# Patient Record
Sex: Female | Born: 1950 | Race: Black or African American | Hispanic: No | Marital: Married | Smoking: Never smoker
Health system: Southern US, Community
[De-identification: ages and names within clinical notes are randomized; demographics above are authoritative.]

## PROBLEM LIST (undated history)

## (undated) DIAGNOSIS — M1990 Unspecified osteoarthritis, unspecified site: Secondary | ICD-10-CM

## (undated) DIAGNOSIS — D649 Anemia, unspecified: Secondary | ICD-10-CM

## (undated) DIAGNOSIS — I509 Heart failure, unspecified: Secondary | ICD-10-CM

## (undated) DIAGNOSIS — N183 Chronic kidney disease, stage 3 unspecified: Secondary | ICD-10-CM

## (undated) DIAGNOSIS — I10 Essential (primary) hypertension: Secondary | ICD-10-CM

## (undated) DIAGNOSIS — K219 Gastro-esophageal reflux disease without esophagitis: Secondary | ICD-10-CM

## (undated) DIAGNOSIS — N289 Disorder of kidney and ureter, unspecified: Secondary | ICD-10-CM

## (undated) HISTORY — PX: OTHER SURGICAL HISTORY: SHX169

## (undated) HISTORY — DX: Anemia, unspecified: D64.9

## (undated) HISTORY — DX: Heart failure, unspecified: I50.9

## (undated) HISTORY — PX: COLONOSCOPY: SHX174

## (undated) HISTORY — DX: Essential (primary) hypertension: I10

---

## 1998-02-13 ENCOUNTER — Inpatient Hospital Stay (HOSPITAL_COMMUNITY): Admission: AD | Admit: 1998-02-13 | Discharge: 1998-02-13 | Payer: Self-pay | Admitting: Obstetrics

## 1998-02-13 ENCOUNTER — Encounter: Payer: Self-pay | Admitting: Obstetrics

## 1998-02-14 ENCOUNTER — Inpatient Hospital Stay (HOSPITAL_COMMUNITY): Admission: AD | Admit: 1998-02-14 | Discharge: 1998-02-14 | Payer: Self-pay | Admitting: Obstetrics & Gynecology

## 1998-02-14 ENCOUNTER — Other Ambulatory Visit: Admission: RE | Admit: 1998-02-14 | Discharge: 1998-02-14 | Payer: Self-pay | Admitting: Obstetrics

## 1999-03-17 ENCOUNTER — Inpatient Hospital Stay (HOSPITAL_COMMUNITY): Admission: EM | Admit: 1999-03-17 | Discharge: 1999-03-19 | Payer: Self-pay | Admitting: *Deleted

## 1999-03-17 ENCOUNTER — Encounter: Payer: Self-pay | Admitting: *Deleted

## 1999-03-31 ENCOUNTER — Encounter: Admission: RE | Admit: 1999-03-31 | Discharge: 1999-06-29 | Payer: Self-pay | Admitting: *Deleted

## 1999-04-02 ENCOUNTER — Encounter: Admission: RE | Admit: 1999-04-02 | Discharge: 1999-04-02 | Payer: Self-pay | Admitting: Internal Medicine

## 1999-04-08 ENCOUNTER — Encounter: Admission: RE | Admit: 1999-04-08 | Discharge: 1999-04-08 | Payer: Self-pay | Admitting: Internal Medicine

## 1999-05-15 ENCOUNTER — Encounter: Admission: RE | Admit: 1999-05-15 | Discharge: 1999-05-15 | Payer: Self-pay | Admitting: Internal Medicine

## 1999-06-25 ENCOUNTER — Encounter: Admission: RE | Admit: 1999-06-25 | Discharge: 1999-06-25 | Payer: Self-pay | Admitting: Internal Medicine

## 1999-09-12 ENCOUNTER — Encounter: Admission: RE | Admit: 1999-09-12 | Discharge: 1999-09-12 | Payer: Self-pay | Admitting: Internal Medicine

## 2000-01-09 ENCOUNTER — Encounter: Admission: RE | Admit: 2000-01-09 | Discharge: 2000-01-09 | Payer: Self-pay

## 2000-05-21 ENCOUNTER — Encounter: Admission: RE | Admit: 2000-05-21 | Discharge: 2000-05-21 | Payer: Self-pay | Admitting: Internal Medicine

## 2000-06-17 ENCOUNTER — Encounter: Admission: RE | Admit: 2000-06-17 | Discharge: 2000-06-17 | Payer: Self-pay

## 2000-11-24 ENCOUNTER — Encounter: Admission: RE | Admit: 2000-11-24 | Discharge: 2000-11-24 | Payer: Self-pay | Admitting: Internal Medicine

## 2000-12-27 ENCOUNTER — Encounter: Admission: RE | Admit: 2000-12-27 | Discharge: 2000-12-27 | Payer: Self-pay

## 2001-06-15 ENCOUNTER — Encounter: Admission: RE | Admit: 2001-06-15 | Discharge: 2001-06-15 | Payer: Self-pay

## 2001-10-25 ENCOUNTER — Encounter: Admission: RE | Admit: 2001-10-25 | Discharge: 2001-10-25 | Payer: Self-pay | Admitting: Internal Medicine

## 2001-10-29 ENCOUNTER — Emergency Department (HOSPITAL_COMMUNITY): Admission: EM | Admit: 2001-10-29 | Discharge: 2001-10-29 | Payer: Self-pay | Admitting: Emergency Medicine

## 2001-11-25 ENCOUNTER — Encounter: Admission: RE | Admit: 2001-11-25 | Discharge: 2001-11-25 | Payer: Self-pay | Admitting: Internal Medicine

## 2001-12-01 ENCOUNTER — Encounter: Admission: RE | Admit: 2001-12-01 | Discharge: 2001-12-01 | Payer: Self-pay | Admitting: Internal Medicine

## 2002-02-16 ENCOUNTER — Encounter: Admission: RE | Admit: 2002-02-16 | Discharge: 2002-02-16 | Payer: Self-pay | Admitting: Internal Medicine

## 2002-04-09 ENCOUNTER — Emergency Department (HOSPITAL_COMMUNITY): Admission: EM | Admit: 2002-04-09 | Discharge: 2002-04-09 | Payer: Self-pay | Admitting: Nurse Practitioner

## 2002-06-27 ENCOUNTER — Encounter: Admission: RE | Admit: 2002-06-27 | Discharge: 2002-06-27 | Payer: Self-pay | Admitting: Internal Medicine

## 2002-07-13 ENCOUNTER — Encounter: Payer: Self-pay | Admitting: Ophthalmology

## 2002-07-17 ENCOUNTER — Ambulatory Visit (HOSPITAL_COMMUNITY): Admission: RE | Admit: 2002-07-17 | Discharge: 2002-07-17 | Payer: Self-pay | Admitting: Ophthalmology

## 2002-08-31 ENCOUNTER — Ambulatory Visit (HOSPITAL_COMMUNITY): Admission: RE | Admit: 2002-08-31 | Discharge: 2002-08-31 | Payer: Self-pay | Admitting: Ophthalmology

## 2002-09-04 ENCOUNTER — Ambulatory Visit (HOSPITAL_COMMUNITY): Admission: RE | Admit: 2002-09-04 | Discharge: 2002-09-04 | Payer: Self-pay | Admitting: Ophthalmology

## 2002-10-09 ENCOUNTER — Emergency Department (HOSPITAL_COMMUNITY): Admission: EM | Admit: 2002-10-09 | Discharge: 2002-10-10 | Payer: Self-pay | Admitting: Emergency Medicine

## 2002-10-10 ENCOUNTER — Encounter: Payer: Self-pay | Admitting: Emergency Medicine

## 2002-10-11 ENCOUNTER — Encounter: Admission: RE | Admit: 2002-10-11 | Discharge: 2002-10-11 | Payer: Self-pay | Admitting: Infectious Diseases

## 2002-11-01 ENCOUNTER — Encounter: Admission: RE | Admit: 2002-11-01 | Discharge: 2003-01-30 | Payer: Self-pay | Admitting: *Deleted

## 2003-02-13 ENCOUNTER — Encounter: Admission: RE | Admit: 2003-02-13 | Discharge: 2003-02-13 | Payer: Self-pay | Admitting: Internal Medicine

## 2003-03-09 ENCOUNTER — Emergency Department (HOSPITAL_COMMUNITY): Admission: EM | Admit: 2003-03-09 | Discharge: 2003-03-09 | Payer: Self-pay | Admitting: Emergency Medicine

## 2003-03-14 ENCOUNTER — Encounter: Admission: RE | Admit: 2003-03-14 | Discharge: 2003-03-14 | Payer: Self-pay | Admitting: Internal Medicine

## 2003-09-18 ENCOUNTER — Encounter: Admission: RE | Admit: 2003-09-18 | Discharge: 2003-09-18 | Payer: Self-pay | Admitting: Internal Medicine

## 2003-09-18 ENCOUNTER — Ambulatory Visit (HOSPITAL_COMMUNITY): Admission: RE | Admit: 2003-09-18 | Discharge: 2003-09-18 | Payer: Self-pay | Admitting: Internal Medicine

## 2003-09-22 ENCOUNTER — Emergency Department (HOSPITAL_COMMUNITY): Admission: EM | Admit: 2003-09-22 | Discharge: 2003-09-22 | Payer: Self-pay | Admitting: Emergency Medicine

## 2003-10-02 ENCOUNTER — Encounter: Admission: RE | Admit: 2003-10-02 | Discharge: 2003-10-02 | Payer: Self-pay | Admitting: Internal Medicine

## 2004-04-22 ENCOUNTER — Ambulatory Visit: Payer: Self-pay | Admitting: Internal Medicine

## 2004-05-07 ENCOUNTER — Ambulatory Visit: Payer: Self-pay | Admitting: Internal Medicine

## 2004-05-15 ENCOUNTER — Ambulatory Visit: Payer: Self-pay | Admitting: Internal Medicine

## 2004-05-21 ENCOUNTER — Ambulatory Visit: Payer: Self-pay | Admitting: Internal Medicine

## 2004-07-23 ENCOUNTER — Emergency Department (HOSPITAL_COMMUNITY): Admission: EM | Admit: 2004-07-23 | Discharge: 2004-07-23 | Payer: Self-pay | Admitting: Emergency Medicine

## 2004-10-09 ENCOUNTER — Encounter (INDEPENDENT_AMBULATORY_CARE_PROVIDER_SITE_OTHER): Payer: Self-pay | Admitting: *Deleted

## 2004-10-09 ENCOUNTER — Ambulatory Visit: Payer: Self-pay | Admitting: Internal Medicine

## 2004-10-21 ENCOUNTER — Encounter (INDEPENDENT_AMBULATORY_CARE_PROVIDER_SITE_OTHER): Payer: Self-pay | Admitting: *Deleted

## 2004-10-21 LAB — CONVERTED CEMR LAB: Pap Smear: NORMAL

## 2004-12-03 ENCOUNTER — Ambulatory Visit: Payer: Self-pay | Admitting: Internal Medicine

## 2005-01-13 ENCOUNTER — Emergency Department (HOSPITAL_COMMUNITY): Admission: EM | Admit: 2005-01-13 | Discharge: 2005-01-13 | Payer: Self-pay | Admitting: Emergency Medicine

## 2005-02-12 ENCOUNTER — Ambulatory Visit: Payer: Self-pay | Admitting: Internal Medicine

## 2005-02-24 ENCOUNTER — Ambulatory Visit: Payer: Self-pay | Admitting: Internal Medicine

## 2005-06-08 ENCOUNTER — Ambulatory Visit: Payer: Self-pay | Admitting: Hospitalist

## 2006-01-11 ENCOUNTER — Encounter (INDEPENDENT_AMBULATORY_CARE_PROVIDER_SITE_OTHER): Payer: Self-pay | Admitting: *Deleted

## 2006-01-11 DIAGNOSIS — E1165 Type 2 diabetes mellitus with hyperglycemia: Secondary | ICD-10-CM | POA: Insufficient documentation

## 2006-01-20 ENCOUNTER — Encounter (INDEPENDENT_AMBULATORY_CARE_PROVIDER_SITE_OTHER): Payer: Self-pay | Admitting: *Deleted

## 2006-02-14 DIAGNOSIS — M1990 Unspecified osteoarthritis, unspecified site: Secondary | ICD-10-CM | POA: Insufficient documentation

## 2006-03-11 ENCOUNTER — Emergency Department (HOSPITAL_COMMUNITY): Admission: EM | Admit: 2006-03-11 | Discharge: 2006-03-11 | Payer: Self-pay | Admitting: Family Medicine

## 2006-05-08 ENCOUNTER — Emergency Department (HOSPITAL_COMMUNITY): Admission: EM | Admit: 2006-05-08 | Discharge: 2006-05-08 | Payer: Self-pay | Admitting: Family Medicine

## 2006-08-04 ENCOUNTER — Ambulatory Visit: Payer: Self-pay | Admitting: Internal Medicine

## 2006-08-04 ENCOUNTER — Encounter (INDEPENDENT_AMBULATORY_CARE_PROVIDER_SITE_OTHER): Payer: Self-pay | Admitting: *Deleted

## 2006-08-04 DIAGNOSIS — M79601 Pain in right arm: Secondary | ICD-10-CM | POA: Insufficient documentation

## 2006-08-04 LAB — CONVERTED CEMR LAB
AST: 10 units/L (ref 0–37)
Alkaline Phosphatase: 128 units/L — ABNORMAL HIGH (ref 39–117)
BUN: 17 mg/dL (ref 6–23)
Blood Glucose, Fingerstick: 478
Creatinine, Ser: 0.8 mg/dL (ref 0.40–1.20)
Creatinine, Urine: 38.1 mg/dL
Hgb A1c MFr Bld: >14 %
Microalb, Ur: 0.24 mg/dL (ref 0.00–1.89)
Total Bilirubin: 0.3 mg/dL (ref 0.3–1.2)

## 2006-08-09 ENCOUNTER — Ambulatory Visit (HOSPITAL_COMMUNITY): Admission: RE | Admit: 2006-08-09 | Discharge: 2006-08-09 | Payer: Self-pay | Admitting: Obstetrics and Gynecology

## 2006-11-26 ENCOUNTER — Emergency Department (HOSPITAL_COMMUNITY): Admission: EM | Admit: 2006-11-26 | Discharge: 2006-11-27 | Payer: Self-pay | Admitting: Emergency Medicine

## 2006-12-02 ENCOUNTER — Ambulatory Visit: Payer: Self-pay | Admitting: Internal Medicine

## 2006-12-02 ENCOUNTER — Encounter (INDEPENDENT_AMBULATORY_CARE_PROVIDER_SITE_OTHER): Payer: Self-pay | Admitting: *Deleted

## 2006-12-02 LAB — CONVERTED CEMR LAB
Blood Glucose, Fingerstick: 369
Hgb A1c MFr Bld: >14 %

## 2007-03-24 ENCOUNTER — Telehealth: Payer: Self-pay | Admitting: *Deleted

## 2007-04-20 ENCOUNTER — Emergency Department (HOSPITAL_COMMUNITY): Admission: EM | Admit: 2007-04-20 | Discharge: 2007-04-20 | Payer: Self-pay | Admitting: Family Medicine

## 2007-05-16 ENCOUNTER — Ambulatory Visit: Payer: Self-pay | Admitting: *Deleted

## 2007-05-17 ENCOUNTER — Emergency Department (HOSPITAL_COMMUNITY): Admission: EM | Admit: 2007-05-17 | Discharge: 2007-05-17 | Payer: Self-pay | Admitting: Family Medicine

## 2007-07-15 ENCOUNTER — Ambulatory Visit (HOSPITAL_COMMUNITY): Admission: RE | Admit: 2007-07-15 | Discharge: 2007-07-15 | Payer: Self-pay | Admitting: Ophthalmology

## 2007-07-18 ENCOUNTER — Ambulatory Visit: Payer: Self-pay | Admitting: Internal Medicine

## 2007-08-10 ENCOUNTER — Ambulatory Visit: Payer: Self-pay | Admitting: *Deleted

## 2007-08-10 LAB — CONVERTED CEMR LAB
ALT: 13 units/L (ref 0–35)
Alkaline Phosphatase: 123 units/L — ABNORMAL HIGH (ref 39–117)
Creatinine, Ser: 0.87 mg/dL (ref 0.40–1.20)
Hgb A1c MFr Bld: 12.7 %
Microalb Creat Ratio: 8.4 mg/g (ref 0.0–30.0)
Microalb, Ur: 1.02 mg/dL (ref 0.00–1.89)
Sodium: 135 meq/L (ref 135–145)
Total Bilirubin: 0.2 mg/dL — ABNORMAL LOW (ref 0.3–1.2)
Total Protein: 6.9 g/dL (ref 6.0–8.3)

## 2007-08-18 ENCOUNTER — Ambulatory Visit: Payer: Self-pay | Admitting: *Deleted

## 2007-08-18 LAB — CONVERTED CEMR LAB
OCCULT 1: NEGATIVE
OCCULT 3: NEGATIVE

## 2007-10-18 ENCOUNTER — Telehealth (INDEPENDENT_AMBULATORY_CARE_PROVIDER_SITE_OTHER): Payer: Self-pay | Admitting: *Deleted

## 2007-11-21 ENCOUNTER — Emergency Department (HOSPITAL_COMMUNITY): Admission: EM | Admit: 2007-11-21 | Discharge: 2007-11-22 | Payer: Self-pay | Admitting: Emergency Medicine

## 2007-11-25 ENCOUNTER — Emergency Department (HOSPITAL_COMMUNITY): Admission: EM | Admit: 2007-11-25 | Discharge: 2007-11-25 | Payer: Self-pay | Admitting: *Deleted

## 2008-02-20 ENCOUNTER — Emergency Department (HOSPITAL_COMMUNITY): Admission: EM | Admit: 2008-02-20 | Discharge: 2008-02-20 | Payer: Self-pay | Admitting: Emergency Medicine

## 2008-03-14 ENCOUNTER — Telehealth: Payer: Self-pay | Admitting: *Deleted

## 2008-03-28 ENCOUNTER — Emergency Department (HOSPITAL_COMMUNITY): Admission: EM | Admit: 2008-03-28 | Discharge: 2008-03-28 | Payer: Self-pay | Admitting: Family Medicine

## 2008-05-16 ENCOUNTER — Emergency Department (HOSPITAL_COMMUNITY): Admission: EM | Admit: 2008-05-16 | Discharge: 2008-05-16 | Payer: Self-pay | Admitting: Family Medicine

## 2008-05-17 ENCOUNTER — Ambulatory Visit (HOSPITAL_BASED_OUTPATIENT_CLINIC_OR_DEPARTMENT_OTHER): Admission: RE | Admit: 2008-05-17 | Discharge: 2008-05-17 | Payer: Self-pay | Admitting: Orthopaedic Surgery

## 2008-05-18 ENCOUNTER — Telehealth (INDEPENDENT_AMBULATORY_CARE_PROVIDER_SITE_OTHER): Payer: Self-pay | Admitting: *Deleted

## 2008-05-21 ENCOUNTER — Encounter (INDEPENDENT_AMBULATORY_CARE_PROVIDER_SITE_OTHER): Payer: Self-pay | Admitting: *Deleted

## 2008-05-21 ENCOUNTER — Encounter (INDEPENDENT_AMBULATORY_CARE_PROVIDER_SITE_OTHER): Payer: Self-pay | Admitting: Internal Medicine

## 2008-05-21 ENCOUNTER — Ambulatory Visit: Payer: Self-pay | Admitting: Internal Medicine

## 2008-05-21 LAB — CONVERTED CEMR LAB
AST: 10 units/L (ref 0–37)
Albumin: 3.7 g/dL (ref 3.5–5.2)
Alkaline Phosphatase: 126 units/L — ABNORMAL HIGH (ref 39–117)
BUN: 12 mg/dL (ref 6–23)
Blood Glucose, Fingerstick: 442
Calcium: 9.3 mg/dL (ref 8.4–10.5)
Chloride: 98 meq/L (ref 96–112)
Hemoglobin: 11.8 g/dL — ABNORMAL LOW (ref 12.0–15.0)
Hgb A1c MFr Bld: >14 %
Potassium: 4.8 meq/L (ref 3.5–5.3)
RBC: 4.46 M/uL (ref 3.87–5.11)
RDW: 13.3 % (ref 11.5–15.5)
Sodium: 135 meq/L (ref 135–145)
Total Protein: 6.4 g/dL (ref 6.0–8.3)

## 2008-09-13 ENCOUNTER — Emergency Department (HOSPITAL_COMMUNITY): Admission: EM | Admit: 2008-09-13 | Discharge: 2008-09-13 | Payer: Self-pay | Admitting: Emergency Medicine

## 2009-01-13 ENCOUNTER — Emergency Department (HOSPITAL_COMMUNITY): Admission: EM | Admit: 2009-01-13 | Discharge: 2009-01-13 | Payer: Self-pay | Admitting: Emergency Medicine

## 2009-02-07 ENCOUNTER — Ambulatory Visit: Payer: Self-pay | Admitting: Internal Medicine

## 2009-02-07 DIAGNOSIS — Z9119 Patient's noncompliance with other medical treatment and regimen: Secondary | ICD-10-CM | POA: Insufficient documentation

## 2009-02-07 LAB — CONVERTED CEMR LAB
Alkaline Phosphatase: 125 units/L — ABNORMAL HIGH (ref 39–117)
BUN: 19 mg/dL (ref 6–23)
Blood Glucose, Fingerstick: 166
CO2: 27 meq/L (ref 19–32)
Creatinine, Ser: 0.74 mg/dL (ref 0.40–1.20)
Glucose, Bld: 152 mg/dL — ABNORMAL HIGH (ref 70–99)
Hgb A1c MFr Bld: 11.9 %
Sodium: 141 meq/L (ref 135–145)
Total Bilirubin: 0.4 mg/dL (ref 0.3–1.2)
Total Protein: 6.4 g/dL (ref 6.0–8.3)

## 2009-02-18 ENCOUNTER — Encounter (INDEPENDENT_AMBULATORY_CARE_PROVIDER_SITE_OTHER): Payer: Self-pay | Admitting: Internal Medicine

## 2009-03-27 ENCOUNTER — Emergency Department (HOSPITAL_COMMUNITY): Admission: EM | Admit: 2009-03-27 | Discharge: 2009-03-27 | Payer: Self-pay | Admitting: Family Medicine

## 2009-07-16 ENCOUNTER — Emergency Department (HOSPITAL_COMMUNITY): Admission: EM | Admit: 2009-07-16 | Discharge: 2009-07-16 | Payer: Self-pay | Admitting: Family Medicine

## 2009-09-08 ENCOUNTER — Emergency Department (HOSPITAL_COMMUNITY): Admission: EM | Admit: 2009-09-08 | Discharge: 2009-09-08 | Payer: Self-pay | Admitting: Emergency Medicine

## 2009-10-23 ENCOUNTER — Ambulatory Visit: Payer: Self-pay | Admitting: Internal Medicine

## 2009-10-23 LAB — CONVERTED CEMR LAB
Blood Glucose, Fingerstick: 184
Hgb A1c MFr Bld: 12.9 %

## 2009-10-24 ENCOUNTER — Encounter: Payer: Self-pay | Admitting: Internal Medicine

## 2009-10-24 LAB — CONVERTED CEMR LAB
CO2: 29 meq/L (ref 19–32)
Calcium: 9.3 mg/dL (ref 8.4–10.5)
Creatinine, Ser: 0.87 mg/dL (ref 0.40–1.20)
Creatinine, Urine: 143.6 mg/dL
Microalb Creat Ratio: 13 mg/g (ref 0.0–30.0)
Microalb, Ur: 1.87 mg/dL (ref 0.00–1.89)
Sodium: 146 meq/L — ABNORMAL HIGH (ref 135–145)

## 2010-02-13 ENCOUNTER — Emergency Department (HOSPITAL_COMMUNITY): Admission: EM | Admit: 2010-02-13 | Discharge: 2009-09-20 | Payer: Self-pay | Admitting: Emergency Medicine

## 2010-03-17 ENCOUNTER — Emergency Department (HOSPITAL_COMMUNITY)
Admission: EM | Admit: 2010-03-17 | Discharge: 2010-03-17 | Payer: Self-pay | Source: Home / Self Care | Admitting: Family Medicine

## 2010-03-24 LAB — GLUCOSE, CAPILLARY: Glucose-Capillary: 423 mg/dL — ABNORMAL HIGH (ref 70–99)

## 2010-04-08 NOTE — Letter (Signed)
Summary: [facility].  [facility]   Imported By: Florinda Marker 03/29/2009 16:46:49  _____________________________________________________________________  External Attachment:    Type:   Image     Comment:   External Document

## 2010-04-08 NOTE — Assessment & Plan Note (Signed)
Summary: DMV PAPERWORK/DS   Vital Signs:  Patient profile:   60 year old female Height:      64 inches (162.56 cm) Weight:      188.8 pounds (85.82 kg) BMI:     32.52 Temp:     97 degrees F (36.11 degrees C) oral Pulse rate:   84 / minute BP sitting:   138 / 92  (left arm) Cuff size:   large  Vitals Entered By: Theotis Barrio NT II (October 23, 2009 4:09 PM) CC: ` Is Patient Diabetic? Yes Did you bring your meter with you today? No / METER BROKEN Pain Assessment Patient in pain? no      Nutritional Status BMI of > 30 = obese CBG Result 184  Have you ever been in a relationship where you felt threatened, hurt or afraid?No   Does patient need assistance? Functional Status Self care Ambulation Normal   Primary Care Provider:  Brooks Sailors MD  CC:  `.  History of Present Illness: 60 yo female with PMH of DM type II, OA presents today for regular check-up and to get her DMV paperwork to be filled out for driving privilege.  Her BS is in 180s at home but her meter is broken and has not been able to check it.  She started to exercise in the past 2 weeks. She denies any numbness and tingling, SOB, chestpain. She has no other complaints.    Preventive Screening-Counseling & Management  Alcohol-Tobacco     Smoking Status: never  Caffeine-Diet-Exercise     Does Patient Exercise: no  Allergies: No Known Drug Allergies  Review of Systems  The patient denies anorexia, fever, weight loss, weight gain, vision loss, decreased hearing, hoarseness, chest pain, syncope, dyspnea on exertion, peripheral edema, prolonged cough, headaches, hemoptysis, abdominal pain, melena, hematochezia, severe indigestion/heartburn, hematuria, incontinence, genital sores, muscle weakness, suspicious skin lesions, transient blindness, difficulty walking, depression, unusual weight change, abnormal bleeding, enlarged lymph nodes, angioedema, breast masses, and testicular masses.    Physical  Exam  General:  alert, well-developed, well-nourished, and well-hydrated.   Lungs:  normal respiratory effort, no intercostal retractions, no accessory muscle use, normal breath sounds, no crackles, and no wheezes.   Heart:  normal rate, regular rhythm, no murmur, no gallop, no rub, and no JVD.   Abdomen:  soft, non-tender, normal bowel sounds, no distention, and no masses.   Pulses:  +2 DP pulses Extremities:  trace left pedal edema and trace right pedal edema.   Neurologic:  alert & oriented X3 and cranial nerves II-XII intact.     Impression & Recommendations:  Problem # 1:  DIABETES MELLITUS, TYPE II (ICD-250.00) Uncontrolled. CBG 184.  HbA1c 12.9 today.  Last HbA1c was 11.9 in 02/2009.  She is currently taking Humulin 70/30 taking 20 u in morning and 15u at night.  I do not have her meter today.  Patient has a history of medication non-compliance.  -Continue Humulin 70/30: 20 u in am and 15u in pm for now.  May consider adding Metformin and/or Glipizide at next visit if her BS is not improving when she comes back. Need to bring her meter next time. -Refer to Jamison Neighbor -Counseled on exercise and medication compliance -Order micro/alb today since her last one was in 2009.  If result comes back elevated, she will need to be on an ACEi.  Her BP is elevated but we will continue to monitor because she has been normotensive in the past.  Her  updated medication list for this problem includes:    Humulin 70/30 70-30 % Susp (Insulin isophane & regular) .Marland Kitchen... 20 units in the morning and 15 units in the evening  Orders: T- Capillary Blood Glucose ([id number]) T-Hgb A1C (in-house) ([id number]) T-Urine Microalbumin w/creat. ratio [phone]) DME Referral (DME) T-Urine Microalbumin w/creat. ratio [phone])  Problem # 2:  PREVENTIVE HEALTH CARE (ICD-V70.0)  Will check lipid panel next visit-fasting.  WIll order Mammogram. schedule pap smear for next visit.  Orders: Mammogram  (Mammogram)  Complete Medication List: 1)  Humulin 70/30 70-30 % Susp (Insulin isophane & regular) .... 20 units in the morning and 15 units in the evening 2)  Onetouch Ultra Test Strp (Glucose blood) .... Use as directed  Other Orders: T-Basic Metabolic Panel [phone]) T-PAP [facility]) [phone])  Patient Instructions: 1)  1. Continue taking Humulin 70/30, 20 units in the morning and 15 units in the evening 2)  2. Try to exercise at least 30 mins per day, 5 days per week to help improve your diabetes 3)  3. Schedule an appointment with Jamison Neighbor- Diabetic Educator 4)  4. Follow up with Dr. Anselm Jungling in 3-4 weeks 5)  5. Pick up your DMV paperwork on Monday 6)  6. Get blood work today Prescriptions: ONETOUCH ULTRA TEST  STRP (GLUCOSE BLOOD) use as directed  #100 x 11   Entered and Authorized by:   Rosana Berger MD   Signed by:   Rosana Berger MD on 10/23/2009   Method used:   Electronically to        [facility] [phone]* (retail)       [address]       [place], [name]  [address]       Ph: [phone]       Fax: [phone]   RxID:   [id number]  Process Orders Check Orders Results:     [facility]: ABN not required for this insurance Tests Sent for requisitioning (October 23, 2009 6:00 PM):     10/23/2009: [facility] -- T-Basic Metabolic Panel [phone] (signed)     10/23/2009: [facility] -- T-Urine Microalbumin w/creat. ratio [[id number]] (signed)     10/23/2009: [facility] -- T-Urine Microalbumin w/creat. ratio [[id number]] (signed)     Prevention & Chronic Care Immunizations   Influenza vaccine: Not documented   Influenza vaccine deferral: Deferred  (02/07/2009)   Influenza vaccine due: 03/08/2009    Tetanus booster: Not documented    Pneumococcal vaccine: Not documented  Colorectal Screening   Hemoccult: normal  (08/15/2007)   Hemoccult due: 08/14/2008    Colonoscopy: Not  documented  Other Screening   Pap smear: Normal  (10/21/2004)   Pap smear action/deferral: Ordered  (10/23/2009)   Pap smear due: 10/21/2005    Mammogram: Refused  (06/08/2005)   Mammogram action/deferral: Ordered  (02/07/2009)   Mammogram due: 06/09/2006   Smoking status: never  (10/23/2009)    Screening comments: She stated that she had a colonoscopy several years ago and it was normal  Diabetes Mellitus   HgbA1C: 12.9  (10/23/2009)   Hemoglobin A1C due: 01/23/2010    Eye exam: Not documented    Foot exam: Not documented   Foot exam action/deferral: Do today   High risk foot: Not documented   Foot care education: Done  (02/07/2009)    Urine microalbumin/creatinine ratio: 8.4  (08/10/2007)   Urine microalbumin action/deferral: Ordered   Urine microalbumin/cr due: 08/09/2008    Diabetes flowsheet reviewed?: Yes  Progress toward A1C goal: Deteriorated  Lipids   Total Cholesterol: Not documented   LDL: Not documented   LDL Direct: 74  (08/10/2007)   HDL: Not documented   Triglycerides: Not documented   Lipid panel due: 11/23/2009  Self-Management Support :   Personal Goals (by the next clinic visit) :     Personal A1C goal: 6  (10/23/2009)     Personal blood pressure goal: 130/80  (10/23/2009)     Personal LDL goal: 100  (10/23/2009)    Patient will work on the following items until the next clinic visit to reach self-care goals:     Medications and monitoring: take my medicines every day, bring all of my medications to every visit, examine my feet every day  (10/23/2009)     Eating: drink diet soda or water instead of juice or soda, eat more vegetables, use fresh or frozen vegetables, eat foods that are low in salt, eat baked foods instead of fried foods, eat fruit for snacks and desserts, limit or avoid alcohol  (10/23/2009)     Activity: take a 30 minute walk every day  (02/07/2009)    Diabetes self-management support: Resources for patients handout   (10/23/2009)      Resource handout printed.   Nursing Instructions: Pap smear today    Laboratory Results   Blood Tests   Date/Time Received: October 23, 2009 4:34 PM  Date/Time Reported: Burke Keels  October 23, 2009 4:34 PM   HGBA1C: 12.9%   (Normal Range: Non-Diabetic - 3-6%   Control Diabetic - 6-8%) CBG Random:: 184mg /dL

## 2010-04-08 NOTE — Letter (Signed)
Summary: DEPARTMENT OF TRANSPORTATION  DEPARTMENT OF TRANSPORTATION   Imported By: Margie Billet 11/07/2009 09:53:25  _____________________________________________________________________  External Attachment:    Type:   Image     Comment:   External Document

## 2010-05-23 LAB — GLUCOSE, CAPILLARY: Glucose-Capillary: 184 mg/dL — ABNORMAL HIGH (ref 70–99)

## 2010-05-24 LAB — DIFFERENTIAL
Eosinophils Relative: 1 % (ref 0–5)
Lymphocytes Relative: 35 % (ref 12–46)
Monocytes Absolute: 0.4 10*3/uL (ref 0.1–1.0)
Monocytes Relative: 6 % (ref 3–12)
Neutro Abs: 3.4 10*3/uL (ref 1.7–7.7)

## 2010-05-24 LAB — CBC
MCV: 83.8 fL (ref 78.0–100.0)
Platelets: 237 10*3/uL (ref 150–400)
RBC: 4.39 MIL/uL (ref 3.87–5.11)
RDW: 13.2 % (ref 11.5–15.5)
WBC: 5.9 10*3/uL (ref 4.0–10.5)

## 2010-05-24 LAB — GLUCOSE, CAPILLARY: Glucose-Capillary: 96 mg/dL (ref 70–99)

## 2010-05-24 LAB — COMPREHENSIVE METABOLIC PANEL
AST: 14 U/L (ref 0–37)
Albumin: 3.5 g/dL (ref 3.5–5.2)
Alkaline Phosphatase: 159 U/L — ABNORMAL HIGH (ref 39–117)
BUN: 11 mg/dL (ref 6–23)
Chloride: 98 mEq/L (ref 96–112)
Creatinine, Ser: 0.81 mg/dL (ref 0.4–1.2)
GFR calc Af Amer: >60 mL/min (ref >60)
Potassium: 4.3 mEq/L (ref 3.5–5.1)
Total Protein: 7.1 g/dL (ref 6.0–8.3)

## 2010-05-25 LAB — DIFFERENTIAL
Basophils Absolute: 0 10*3/uL (ref 0.0–0.1)
Eosinophils Relative: 2 % (ref 0–5)
Lymphocytes Relative: 36 % (ref 12–46)
Neutro Abs: 3.1 10*3/uL (ref 1.7–7.7)
Neutrophils Relative %: 55 % (ref 43–77)

## 2010-05-25 LAB — BASIC METABOLIC PANEL
BUN: 14 mg/dL (ref 6–23)
Calcium: 9.4 mg/dL (ref 8.4–10.5)
Creatinine, Ser: 0.74 mg/dL (ref 0.4–1.2)
GFR calc Af Amer: >60 mL/min (ref >60)
GFR calc non Af Amer: >60 mL/min (ref >60)

## 2010-05-25 LAB — CBC
Platelets: 180 10*3/uL (ref 150–400)
RDW: 12.7 % (ref 11.5–15.5)
WBC: 5.7 10*3/uL (ref 4.0–10.5)

## 2010-05-25 LAB — URINALYSIS, ROUTINE W REFLEX MICROSCOPIC
Ketones, ur: NEGATIVE mg/dL
Leukocytes, UA: NEGATIVE
Nitrite: NEGATIVE
Specific Gravity, Urine: 1.033 — ABNORMAL HIGH (ref 1.005–1.030)
pH: 6 (ref 5.0–8.0)

## 2010-05-25 LAB — URINE MICROSCOPIC-ADD ON

## 2010-05-25 LAB — GLUCOSE, CAPILLARY: Glucose-Capillary: 351 mg/dL — ABNORMAL HIGH (ref 70–99)

## 2010-06-05 ENCOUNTER — Ambulatory Visit (INDEPENDENT_AMBULATORY_CARE_PROVIDER_SITE_OTHER): Payer: Self-pay

## 2010-06-05 ENCOUNTER — Inpatient Hospital Stay (INDEPENDENT_AMBULATORY_CARE_PROVIDER_SITE_OTHER)
Admission: RE | Admit: 2010-06-05 | Discharge: 2010-06-05 | Disposition: A | Discharge status: Home / Self Care | Payer: Self-pay | Source: Ambulatory Visit | Attending: Family Medicine | Admitting: Family Medicine

## 2010-06-05 DIAGNOSIS — T07XXXA Unspecified multiple injuries, initial encounter: Secondary | ICD-10-CM

## 2010-06-11 LAB — GLUCOSE, CAPILLARY

## 2010-06-18 ENCOUNTER — Emergency Department (HOSPITAL_COMMUNITY): Payer: Self-pay

## 2010-06-18 ENCOUNTER — Encounter (HOSPITAL_COMMUNITY): Payer: Self-pay | Admitting: Radiology

## 2010-06-18 ENCOUNTER — Emergency Department (HOSPITAL_COMMUNITY)
Admission: EM | Admit: 2010-06-18 | Discharge: 2010-06-18 | Disposition: A | Discharge status: Home / Self Care | Payer: Self-pay | Attending: Emergency Medicine | Admitting: Emergency Medicine

## 2010-06-18 DIAGNOSIS — E119 Type 2 diabetes mellitus without complications: Secondary | ICD-10-CM | POA: Insufficient documentation

## 2010-06-18 DIAGNOSIS — K6289 Other specified diseases of anus and rectum: Secondary | ICD-10-CM | POA: Insufficient documentation

## 2010-06-18 DIAGNOSIS — R197 Diarrhea, unspecified: Secondary | ICD-10-CM | POA: Insufficient documentation

## 2010-06-18 DIAGNOSIS — Z794 Long term (current) use of insulin: Secondary | ICD-10-CM | POA: Insufficient documentation

## 2010-06-18 DIAGNOSIS — R109 Unspecified abdominal pain: Secondary | ICD-10-CM | POA: Insufficient documentation

## 2010-06-18 LAB — URINALYSIS, ROUTINE W REFLEX MICROSCOPIC
Bilirubin Urine: NEGATIVE
Glucose, UA: >1000 mg/dL — AB
Ketones, ur: NEGATIVE mg/dL
Leukocytes, UA: NEGATIVE
Nitrite: NEGATIVE
Protein, ur: NEGATIVE mg/dL
Specific Gravity, Urine: 1.022 (ref 1.005–1.030)
Urobilinogen, UA: 0.2 mg/dL (ref 0.0–1.0)
pH: 6.5 (ref 5.0–8.0)

## 2010-06-18 LAB — URINE MICROSCOPIC-ADD ON

## 2010-06-18 LAB — COMPREHENSIVE METABOLIC PANEL
BUN: 21 mg/dL (ref 6–23)
CO2: 25 mEq/L (ref 19–32)
Chloride: 108 mEq/L (ref 96–112)
Creatinine, Ser: 0.86 mg/dL (ref 0.4–1.2)
GFR calc non Af Amer: >60 mL/min (ref >60)
Total Bilirubin: 0.4 mg/dL (ref 0.3–1.2)

## 2010-06-18 LAB — COMPREHENSIVE METABOLIC PANEL WITH GFR
ALT: 12 U/L (ref 0–35)
AST: 15 U/L (ref 0–37)
Albumin: 3 g/dL — ABNORMAL LOW (ref 3.5–5.2)
Alkaline Phosphatase: 115 U/L (ref 39–117)
Calcium: 8.7 mg/dL (ref 8.4–10.5)
GFR calc Af Amer: >60 mL/min (ref >60)
Glucose, Bld: 275 mg/dL — ABNORMAL HIGH (ref 70–99)
Potassium: 4.3 meq/L (ref 3.5–5.1)
Sodium: 138 meq/L (ref 135–145)
Total Protein: 5.9 g/dL — ABNORMAL LOW (ref 6.0–8.3)

## 2010-06-18 LAB — CBC
HCT: 32.3 % — ABNORMAL LOW (ref 36.0–46.0)
Hemoglobin: 10.7 g/dL — ABNORMAL LOW (ref 12.0–15.0)
MCH: 26.8 pg (ref 26.0–34.0)
MCHC: 33.1 g/dL (ref 30.0–36.0)
MCV: 81 fL (ref 78.0–100.0)
Platelets: 202 K/uL (ref 150–400)
RBC: 3.99 MIL/uL (ref 3.87–5.11)
RDW: 12.8 % (ref 11.5–15.5)
WBC: 4.8 K/uL (ref 4.0–10.5)

## 2010-06-18 LAB — DIFFERENTIAL
Basophils Absolute: 0 K/uL (ref 0.0–0.1)
Basophils Relative: 1 % (ref 0–1)
Eosinophils Absolute: 0.1 10*3/uL (ref 0.0–0.7)
Eosinophils Relative: 3 % (ref 0–5)
Lymphocytes Relative: 30 % (ref 12–46)
Lymphs Abs: 1.4 10*3/uL (ref 0.7–4.0)
Monocytes Absolute: 0.3 K/uL (ref 0.1–1.0)
Monocytes Relative: 5 % (ref 3–12)
Neutro Abs: 2.9 10*3/uL (ref 1.7–7.7)
Neutrophils Relative %: 61 % (ref 43–77)

## 2010-06-18 LAB — OCCULT BLOOD, POC DEVICE: Fecal Occult Bld: NEGATIVE

## 2010-06-18 MED ORDER — IOHEXOL 300 MG/ML  SOLN: 100.0000 mL | Freq: Once | INTRAMUSCULAR | Status: DC | PRN

## 2010-06-18 MED ORDER — IOHEXOL 300 MG/ML  SOLN
100.0000 mL | Freq: Once | INTRAMUSCULAR | Status: AC | PRN
  Administered 2010-06-18: 100 mL via INTRAVENOUS

## 2010-06-19 LAB — POCT I-STAT, CHEM 8
BUN: 18 mg/dL (ref 6–23)
Chloride: 95 mEq/L — ABNORMAL LOW (ref 96–112)
Sodium: 133 mEq/L — ABNORMAL LOW (ref 135–145)
TCO2: 29 mmol/L (ref 0–100)

## 2010-06-19 LAB — BASIC METABOLIC PANEL
GFR calc Af Amer: >60 mL/min (ref >60)
GFR calc non Af Amer: 54 mL/min — ABNORMAL LOW (ref >60)
Potassium: 4.5 mEq/L (ref 3.5–5.1)
Sodium: 130 mEq/L — ABNORMAL LOW (ref 135–145)

## 2010-06-19 LAB — GLUCOSE, CAPILLARY
Glucose-Capillary: 423 mg/dL — ABNORMAL HIGH (ref 70–99)
Glucose-Capillary: 583 mg/dL (ref 70–99)

## 2010-07-22 ENCOUNTER — Inpatient Hospital Stay (INDEPENDENT_AMBULATORY_CARE_PROVIDER_SITE_OTHER)
Admission: RE | Admit: 2010-07-22 | Discharge: 2010-07-22 | Disposition: A | Discharge status: Home / Self Care | Payer: Self-pay | Source: Ambulatory Visit | Attending: Family Medicine | Admitting: Family Medicine

## 2010-07-22 DIAGNOSIS — M79609 Pain in unspecified limb: Secondary | ICD-10-CM

## 2010-07-22 DIAGNOSIS — M67919 Unspecified disorder of synovium and tendon, unspecified shoulder: Secondary | ICD-10-CM
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
## 2010-07-25 NOTE — Discharge Summary (Signed)
[facility]  Patient:    Rachel Gonzalez, Rachel Gonzalez                        MRN: [id number] Adm. Date:  03/17/99 Disc. Date: 03/19/99 Attending:  Dewayne Shorter, M.D. Dictator:   Trilby Drummer, M.D. CC:         Trilby Drummer, M.D.                           Discharge Summary  DISCHARGE DIAGNOSES: 1. Type 2 diabetes mellitus, uncontrolled.    a. Diabetic ketoacidosis, resolved, March 18, 1999. 2. Social issues, financial difficulties.  DISCHARGE MEDICATIONS: 1. Glucotrol 10 mg 1 tablet p.o. q.d. 2. Glucophage 500 mg 1 tablet p.o. b.i.d.  FOLLOW-UP: 1. With me, Dr. Dionicia Abler, at the outpatient internal medicine clinic at [facility], at    which time the patient will be called for an appointment at the [facility]    clinic. 2. Outpatient diabetic program on Monday, March 31, 1999, at 5:15 p.m.  The    patient was taught how to use a glucometer, was advised to keep a log book where    she lists her blood sugars once a day, and to return if her blood sugars are    persistently over 350-400.  HISTORY OF PRESENT ILLNESS:  This is a 60 year old black female with history of  noncompliance, diabetes, who has not taken her medications for the past five months.  She presented with nausea and vomiting for six days, overwhelming fatigue for the past three to four days.  She has had polyuria, polydipsia.  She was discharged from Dr. Magdalen Spatz practice secondary to noncompliance.  PHYSICAL EXAMINATION:  Lethargic, withdrawn, thin female, with dry mucous membranes and poor skin turgor, otherwise unremarkable.  LABORATORY DATA:  Her CBG on admission was 526, anion gap of 19.  HOSPITAL COURSE: #1 - UNCONTROLLED DIABETES WITH DIABETIC KETOACIDOSIS:  The patient was admitted to stepdown unit, where she was given IV fluids in order to rehydrate her as well s started on an insulin drip with potassium repleted as necessary.  She went through diabetic education as well  as advised by the case manager.  The patient was discharged in stable condition with follow-up with me, Dr. Dionicia Abler. DD:  03/19/99 TD:  03/19/99 Job: [id number]

## 2010-07-25 NOTE — Op Note (Signed)
NAME:  Rachel Gonzalez, Rachel Gonzalez                         ACCOUNT NO.:  [id number]   MEDICAL RECORD NO.:  [id number]                   PATIENT TYPE:  OIB   LOCATION:  2899                                 FACILITY:  [facility]   PHYSICIAN:  Salley Scarlet., M.D.         DATE OF BIRTH:  03/30/1950   DATE OF PROCEDURE:  07/18/2002  DATE OF DISCHARGE:  07/17/2002                                 OPERATIVE REPORT   PREOPERATIVE DIAGNOSIS:  Immature cataract, right eye.   POSTOPERATIVE DIAGNOSIS:  Immature cataract, right eye.   OPERATION:  Kelman phacoemulsification cataract right eye.   SURGEON:  Nadyne Coombes, M.D.   ANESTHESIA:  Local using Xylocaine 2% and Marcaine 0.75.   JUSTIFICATION FOR PROCEDURE:  This is a 60 year old, diabetic lady who  complains of blurring of vision with difficulty seeing to drive and to do  her work.  She was evaluated and found to have a visual acuity best  corrected at 20/200 on the right and 20/70 on the left.  There were  bilateral dense posterior subcapsular cataracts, worse on the right than the  left.  Cataract extraction with intraocular lens implantation to the right  eye was recommended.  She was admitted at this time for that purpose.   DESCRIPTION OF PROCEDURE:  After IV sedation, Van Lint akinesis and  retrobulbar anesthesia was given.  The patient was prepped and draped in the  usual manner.  The lid speculum was inserted under the upper lower lid of  the right eye and a 4-0 silk traction suture was passed through the belly of  the rectus muscle and fascia.  A 4 inch based conjunctival flap was turned.  Hemostasis was achieved using cautery.  An incision was made in the sclera  at the limbus and dissected down inferior cornea.  A xiphoid incision was  made at 1:30 position.  Occucoat was injected into the eye through this  xiphoid incision.  The anterior chamber was then entered through  corneoscleral incision at the 11:30 position.  An  anterior capsulotomy was  done using bent 75 gauge needle.  The nucleus was dissected using Xylocaine.  The JP hand piece was fashioned into the eye and the nucleus was emulsified  without difficulty.  The residual cortical material was aspirated.  The  posterior capsule was polished using polisher.  The wound was widened  slightly to accommodate a silicone lens.  This lens was seated into the eye  band and the iris without difficulty.  The anterior chamber was reformed  using Miochol.  The lips of the wound were hydrated and tested to make sure  that there were no leaks.  After ascertaining that there were no leaks, the  conjunctiva was closed over the wound using 7-0.  1 cc of Celestone and 0.5%  of Gentamicin were injected subconjuctivally.  Maxitrol ophthalmic ointment  and Pilopine ointment were applied along with a patch and Caryn Section  shield.  The  patient tolerated the procedure well and was discharged to the  postanesthesia care unit in satisfactory condition.  She was instructed to  rest today.  To take Vicodin every four hours as-needed for pain.  To see me  in the office tomorrow for further evaluation.   DISCHARGE DIAGNOSIS:  Immature cataract, right eye.                                               Salley Scarlet., M.D.   TB/MEDQ  D:  07/18/2002  T:  07/19/2002  Job:  [id number]

## 2010-07-25 NOTE — Op Note (Signed)
NAME:  RAZAN, SILER                         ACCOUNT NO.:  [id number]   MEDICAL RECORD NO.:  [id number]                   PATIENT TYPE:  OIB   LOCATION:  2860                                 FACILITY:  [facility]   PHYSICIAN:  Salley Scarlet., M.D.         DATE OF BIRTH:  June 21, 1950   DATE OF PROCEDURE:  DATE OF DISCHARGE:  08/31/2002                                 OPERATIVE REPORT   ADDENDUM:  Under the influence of IV sedation, a Van Lint akinesia and retrobulbar  anesthesia were given.  The patient was prepped and draped in the usual  manner.  The lid speculum was inserted under the upper and lower lid of the  left eye and a 4-0 silk traction suture was passed through the belly of the  rectus muscle for traction.  A fornix-based conjunctival flap was turned and  hemostasis achieved by using cautery.  An incision was made in the sclera at  the limbus.  This incision was dissected down to clear cornea using a  crescent blade.  A side port incision made at the 1:30 clock our position.  Occucoat was injected into the eye through the side port incision.  The  anterior chamber was then entered through the corneoscleral tunnel incision  at the 11:30 clock hour position using a 2.7 mm keratome.  An anterior  capsulotomy was done using a bent 25-gauge needle.  The nucleus was  hydrodissected using Xylocaine.  The KPE handpiece was passed into the eye  and the nucleus was emulsified without difficulty.  The residual cortical  material was aspirated.  The posterior capsule was polished using an olive-  tip polisher.  The wound was widened slightly to accommodate a foldable  silicone lens.  This lens was seated into the eye behind the iris without  difficulty.  The anterior chamber was reformed and the pupil was constricted  using Miochol.  The lips of the wound were hydrated and tested to make sure  that there was no leak.  After it was ascertained that there was no leak,  the  conjunctiva was closed over the wound using thermal cautery.  Celestone  1 mL and 0.5 mL of gentamicin were injected subconjuctivally.  Maxitrol  ophthalmic ointment and Pilopine ointment were applied along with a patch  and Fox shield.  The patient tolerated the procedure well and was discharged  to the postanesthesia recovery room in satisfactory condition.  She was  instructed to rest today, to take Vicodin every four hours as needed for  pain, and to see me in the office tomorrow for further evaluation.   DISCHARGE DIAGNOSIS:  Immature cataract, left eye.                                               Maisie Fus  Carlyle Lipa., M.D.    TB/MEDQ  D:  09/05/2002  T:  09/05/2002  Job:  [id number]

## 2010-12-08 LAB — COMPREHENSIVE METABOLIC PANEL
Alkaline Phosphatase: 128 — ABNORMAL HIGH
BUN: 9
CO2: 25
Chloride: 96
GFR calc non Af Amer: >60
Glucose, Bld: 374 — ABNORMAL HIGH
Potassium: 4.3
Total Bilirubin: 1.2
Total Protein: 6.9

## 2010-12-08 LAB — PROTIME-INR
INR: 1
Prothrombin Time: 13.6

## 2010-12-08 LAB — DIFFERENTIAL
Basophils Absolute: 0
Basophils Relative: 0
Neutro Abs: 5.8
Neutrophils Relative %: 75

## 2010-12-08 LAB — LIPASE, BLOOD: Lipase: 13

## 2010-12-08 LAB — URINALYSIS, ROUTINE W REFLEX MICROSCOPIC
Bilirubin Urine: NEGATIVE
Glucose, UA: >1000 — AB
Protein, ur: NEGATIVE
Urobilinogen, UA: 0.2

## 2010-12-08 LAB — CBC
HCT: 37.6
Hemoglobin: 12.5
RDW: 13.3

## 2010-12-08 LAB — URINE MICROSCOPIC-ADD ON

## 2010-12-10 LAB — POCT I-STAT, CHEM 8
BUN: 14
Calcium, Ion: 1.11 — ABNORMAL LOW
Chloride: 103
Glucose, Bld: 337 — ABNORMAL HIGH
Potassium: 4.1

## 2010-12-10 LAB — DIFFERENTIAL
Eosinophils Absolute: 0.1
Eosinophils Relative: 1
Lymphs Abs: 1.6
Monocytes Absolute: 0.4
Monocytes Relative: 5

## 2010-12-10 LAB — CBC
HCT: 38.1
Hemoglobin: 12.5
MCV: 83.5
RBC: 4.57
WBC: 8

## 2010-12-12 LAB — POCT I-STAT, CHEM 8
BUN: 11 mg/dL (ref 6–23)
Calcium, Ion: 1.27 mmol/L (ref 1.12–1.32)
Chloride: 96 meq/L (ref 96–112)
Creatinine, Ser: 0.9 mg/dL (ref 0.4–1.2)
Glucose, Bld: 490 mg/dL — ABNORMAL HIGH (ref 70–99)
HCT: 43 % (ref 36.0–46.0)
Hemoglobin: 14.6 g/dL (ref 12.0–15.0)
Potassium: 4.9 meq/L (ref 3.5–5.1)
Sodium: 133 mEq/L — ABNORMAL LOW (ref 135–145)
TCO2: 24 mmol/L (ref 0–100)

## 2010-12-12 LAB — URINALYSIS, ROUTINE W REFLEX MICROSCOPIC
Bilirubin Urine: NEGATIVE
Glucose, UA: >1000 mg/dL — AB
Hgb urine dipstick: NEGATIVE
Ketones, ur: >80 mg/dL — AB
Leukocytes, UA: NEGATIVE
Nitrite: NEGATIVE
Protein, ur: NEGATIVE mg/dL
Specific Gravity, Urine: 1.04 — ABNORMAL HIGH (ref 1.005–1.030)
Urobilinogen, UA: 0.2 mg/dL (ref 0.0–1.0)
pH: 5.5 (ref 5.0–8.0)

## 2010-12-12 LAB — GLUCOSE, CAPILLARY: Glucose-Capillary: 449 mg/dL — ABNORMAL HIGH (ref 70–99)

## 2010-12-12 LAB — URINE MICROSCOPIC-ADD ON

## 2010-12-18 LAB — BASIC METABOLIC PANEL
BUN: 9
CO2: 27
Calcium: 9.5
Creatinine, Ser: 0.81
GFR calc non Af Amer: >60
Glucose, Bld: 588
Sodium: 130 — ABNORMAL LOW

## 2011-02-26 ENCOUNTER — Encounter (HOSPITAL_COMMUNITY): Payer: Self-pay | Admitting: Emergency Medicine

## 2011-02-26 ENCOUNTER — Other Ambulatory Visit: Payer: Self-pay

## 2011-02-26 ENCOUNTER — Emergency Department (HOSPITAL_COMMUNITY)
Admission: EM | Admit: 2011-02-26 | Discharge: 2011-02-26 | Disposition: A | Discharge status: Home / Self Care | Payer: PRIVATE HEALTH INSURANCE | Attending: Emergency Medicine | Admitting: Emergency Medicine

## 2011-02-26 ENCOUNTER — Emergency Department (HOSPITAL_COMMUNITY): Payer: PRIVATE HEALTH INSURANCE

## 2011-02-26 DIAGNOSIS — R109 Unspecified abdominal pain: Secondary | ICD-10-CM | POA: Insufficient documentation

## 2011-02-26 DIAGNOSIS — IMO0001 Reserved for inherently not codable concepts without codable children: Secondary | ICD-10-CM | POA: Insufficient documentation

## 2011-02-26 DIAGNOSIS — R197 Diarrhea, unspecified: Secondary | ICD-10-CM | POA: Insufficient documentation

## 2011-02-26 DIAGNOSIS — R079 Chest pain, unspecified: Secondary | ICD-10-CM | POA: Insufficient documentation

## 2011-02-26 DIAGNOSIS — Z794 Long term (current) use of insulin: Secondary | ICD-10-CM | POA: Insufficient documentation

## 2011-02-26 DIAGNOSIS — N390 Urinary tract infection, site not specified: Secondary | ICD-10-CM | POA: Insufficient documentation

## 2011-02-26 DIAGNOSIS — B9789 Other viral agents as the cause of diseases classified elsewhere: Secondary | ICD-10-CM | POA: Insufficient documentation

## 2011-02-26 DIAGNOSIS — E119 Type 2 diabetes mellitus without complications: Secondary | ICD-10-CM | POA: Insufficient documentation

## 2011-02-26 DIAGNOSIS — R739 Hyperglycemia, unspecified: Secondary | ICD-10-CM

## 2011-02-26 DIAGNOSIS — B349 Viral infection, unspecified: Secondary | ICD-10-CM

## 2011-02-26 DIAGNOSIS — R112 Nausea with vomiting, unspecified: Secondary | ICD-10-CM | POA: Insufficient documentation

## 2011-02-26 LAB — BASIC METABOLIC PANEL
Calcium: 10.2 mg/dL (ref 8.4–10.5)
Chloride: 97 mEq/L (ref 96–112)
Creatinine, Ser: 0.77 mg/dL (ref 0.50–1.10)
Potassium: 4.2 mEq/L (ref 3.5–5.1)
Sodium: 135 mEq/L (ref 135–145)

## 2011-02-26 LAB — DIFFERENTIAL
Basophils Absolute: 0 10*3/uL (ref 0.0–0.1)
Basophils Relative: 0 % (ref 0–1)
Eosinophils Absolute: 0.1 10*3/uL (ref 0.0–0.7)
Monocytes Relative: 5 % (ref 3–12)
Neutro Abs: 4.7 10*3/uL (ref 1.7–7.7)
Neutrophils Relative %: 69 % (ref 43–77)

## 2011-02-26 LAB — URINALYSIS, ROUTINE W REFLEX MICROSCOPIC
Glucose, UA: >1000 mg/dL — AB
Ketones, ur: 15 mg/dL — AB
Protein, ur: NEGATIVE mg/dL

## 2011-02-26 LAB — CBC
Hemoglobin: 12 g/dL (ref 12.0–15.0)
MCH: 26.5 pg (ref 26.0–34.0)
MCHC: 32.3 g/dL (ref 30.0–36.0)
Platelets: 244 10*3/uL (ref 150–400)
RDW: 12.9 % (ref 11.5–15.5)

## 2011-02-26 LAB — URINE MICROSCOPIC-ADD ON

## 2011-02-26 LAB — GLUCOSE, CAPILLARY

## 2011-02-26 MED ORDER — SODIUM CHLORIDE 0.9 % IV BOLUS (SEPSIS)
1000.0000 mL | Freq: Once | INTRAVENOUS | Status: AC
  Administered 2011-02-26: 1000 mL via INTRAVENOUS

## 2011-02-26 MED ORDER — CIPROFLOXACIN HCL 500 MG PO TABS: 500.0000 mg | ORAL_TABLET | Freq: Two times a day (BID) | ORAL | Status: DC

## 2011-02-26 MED ORDER — INSULIN ASPART 100 UNIT/ML ~~LOC~~ SOLN
10.0000 [IU] | Freq: Once | SUBCUTANEOUS | Status: AC
  Administered 2011-02-26: 10 [IU] via SUBCUTANEOUS
  Filled 2011-02-26: qty 1

## 2011-02-26 MED ORDER — ONDANSETRON 8 MG PO TBDP: 8.0000 mg | ORAL_TABLET | Freq: Three times a day (TID) | ORAL | Status: AC | PRN

## 2011-02-26 MED ORDER — INSULIN REGULAR HUMAN 100 UNIT/ML IJ SOLN: 10.0000 [IU] | Freq: Once | INTRAMUSCULAR | Status: DC

## 2011-02-26 MED ORDER — INSULIN ASPART 100 UNIT/ML ~~LOC~~ SOLN
10.0000 [IU] | Freq: Once | SUBCUTANEOUS | Status: AC
  Administered 2011-02-26: 10 [IU] via SUBCUTANEOUS
  Filled 2011-02-26: qty 0.1
  Filled 2011-02-26: qty 1

## 2011-02-26 MED ORDER — ONDANSETRON 8 MG PO TBDP
8.0000 mg | ORAL_TABLET | Freq: Once | ORAL | Status: AC
  Administered 2011-02-26: 8 mg via ORAL
  Filled 2011-02-26: qty 1

## 2011-02-26 NOTE — ED Provider Notes (Signed)
History     CSN: [id number] Arrival date & time: 02/26/2011  1:41 AM   First MD Initiated Contact with Patient 02/26/11 [phone]      Chief Complaint  Patient presents with  . Abdominal Pain  . Chest Pain     HPI  History provided by the patient. Patient is a 60 year old female with history of diabetes who presents with acute onset of myalgias, nausea/vomiting/diarrhea early yesterday. Patient complains of pain throughout entire body but mostly in chest and abdomen. Patient denies any cough, shortness of breath or hemoptysis. Patient reports checking her blood sugar earlier today and states he was around 180. Patient has been able to tolerate some by mouth fluids. She reports up to 8 episodes of vomiting. She reports 4 episodes of loose watery stools without blood or mucus. Patient denies any aggravating or alleviating factors. Patient denies any other significant past medical history.   Past Medical History  Diagnosis Date  . Diabetes mellitus     Past Surgical History  Procedure Date  . Arm surgery      Family History  Problem Relation Age of Onset  . Diabetes Other     History  Substance Use Topics  . Smoking status: Never Smoker   . Smokeless tobacco: Not on file  . Alcohol Use: No    OB History    Grav Para Term Preterm Abortions TAB SAB Ect Mult Living                  Review of Systems  Constitutional: Positive for chills and appetite change. Negative for fever.  HENT: Negative for congestion and rhinorrhea.   Respiratory: Negative for cough and shortness of breath.   Gastrointestinal: Positive for nausea, vomiting, abdominal pain and diarrhea. Negative for constipation.  Musculoskeletal: Positive for myalgias.  All other systems reviewed and are negative.    Allergies  Tramadol  Home Medications   Current Outpatient Rx  Name Route Sig Dispense Refill  . INSULIN ISOPHANE & REGULAR (70-30) 100 UNIT/ML [place] SUSP Subcutaneous Inject 20 Units into the  skin daily with breakfast.      . INSULIN ISOPHANE & REGULAR (70-30) 100 UNIT/ML [place] SUSP Subcutaneous Inject 15 Units into the skin daily with supper.      . TRAMADOL HCL 50 MG PO TABS Oral Take 50 mg by mouth every 6 (six) hours as needed. Maximum dose= 8 tablets per day       BP 116/102  Pulse 84  Temp(Src) 98.9 F (37.2 C) (Oral)  Resp 18  SpO2 98%  Physical Exam  Nursing note and vitals reviewed. Constitutional: She is oriented to person, place, and time. She appears well-developed and well-nourished. No distress.  HENT:  Head: Normocephalic.  Mouth/Throat: Oropharynx is clear and moist.  Eyes: Conjunctivae and EOM are normal. Pupils are equal, round, and reactive to light.  Neck: Normal range of motion.       No meningeal signs  Cardiovascular: Normal rate, regular rhythm and normal heart sounds.   Pulmonary/Chest: Effort normal. No respiratory distress. She has no rales.  Abdominal: Soft. Bowel sounds are normal. She exhibits no distension. There is no tenderness. There is no rebound and no guarding.  Musculoskeletal: She exhibits no edema and no tenderness.  Neurological: She is alert and oriented to person, place, and time.  Skin: Skin is warm.  Psychiatric: Her behavior is normal.    ED Course  Procedures (including critical care time)  Labs Reviewed  BASIC METABOLIC  PANEL - Abnormal; Notable for the following:    Glucose, Bld 448 (*)    All other components within normal limits  CBC  DIFFERENTIAL   Results for orders placed during the hospital encounter of 02/26/11  CBC      Component Value Range   WBC 6.9  4.0 - 10.5 (K/uL)   RBC 4.53  3.87 - 5.11 (MIL/uL)   Hemoglobin 12.0  12.0 - 15.0 (g/dL)   HCT 16.1  09.6 - 04.5 (%)   MCV 81.9  78.0 - 100.0 (fL)   MCH 26.5  26.0 - 34.0 (pg)   MCHC 32.3  30.0 - 36.0 (g/dL)   RDW 40.9  81.1 - 91.4 (%)   Platelets 244  150 - 400 (K/uL)  DIFFERENTIAL      Component Value Range   Neutrophils Relative 69  43 - 77 (%)    Neutro Abs 4.7  1.7 - 7.7 (K/uL)   Lymphocytes Relative 26  12 - 46 (%)   Lymphs Abs 1.8  0.7 - 4.0 (K/uL)   Monocytes Relative 5  3 - 12 (%)   Monocytes Absolute 0.3  0.1 - 1.0 (K/uL)   Eosinophils Relative 1  0 - 5 (%)   Eosinophils Absolute 0.1  0.0 - 0.7 (K/uL)   Basophils Relative 0  0 - 1 (%)   Basophils Absolute 0.0  0.0 - 0.1 (K/uL)  BASIC METABOLIC PANEL      Component Value Range   Sodium 135  135 - 145 (mEq/L)   Potassium 4.2  3.5 - 5.1 (mEq/L)   Chloride 97  96 - 112 (mEq/L)   CO2 30  19 - 32 (mEq/L)   Glucose, Bld 448 (*) 70 - 99 (mg/dL)   BUN 16  6 - 23 (mg/dL)   Creatinine, Ser 7.82  0.50 - 1.10 (mg/dL)   Calcium 95.6  8.4 - 10.5 (mg/dL)   GFR calc non Af Amer >90  >90 (mL/min)   GFR calc Af Amer >90  >90 (mL/min)     Dg Chest 2 View  02/26/2011  *RADIOLOGY REPORT*  Clinical Data: Right-sided chest pain  CHEST - 2 VIEW  Comparison: 09/08/2009  Findings: Lungs are clear. No pleural effusion or pneumothorax. The cardiomediastinal contours are within normal limits. The visualized bones and soft tissues are without significant appreciable abnormality.  IMPRESSION: No acute cardiopulmonary process.  Original Report Authenticated By: Waneta Martins, M.D.     1. Hyperglycemia       MDM  3:45 AM patient seen and evaluated. Patient in no acute distress.   6:00AM Pt discussed in sign out with Jaynie Crumble PAC.  She will continue to monitor blood sugar.    Date: 02/26/2011  Rate: 59  Rhythm: normal sinus rhythm and premature atrial contractions (PAC)  QRS Axis: right  Intervals: normal  ST/T Wave abnormalities: normal  Conduction Disutrbances:none  Narrative Interpretation:   Old EKG Reviewed: unchanged 05/16/2008    Angus Seller, PA 02/26/11 [phone]

## 2011-02-26 NOTE — ED Notes (Signed)
Pt states she thinks she may be allergic to tramadol that she started taking on the 17th

## 2011-02-26 NOTE — ED Notes (Signed)
Pt comes in tonight c/o abd pain and chest pain  Pt states the pain started yesterday  Pt states she has had some vomiting and a headache

## 2011-02-27 LAB — URINE CULTURE: Culture  Setup Time: 201212201105

## 2011-02-27 NOTE — ED Provider Notes (Signed)
Medical screening examination/treatment/procedure(s) were performed by non-physician practitioner and as supervising physician I was immediately available for consultation/collaboration.   Vida Roller, MD 02/27/11 [phone]

## 2011-03-05 ENCOUNTER — Ambulatory Visit (INDEPENDENT_AMBULATORY_CARE_PROVIDER_SITE_OTHER): Payer: Managed Care, Other (non HMO) | Admitting: Internal Medicine

## 2011-03-05 ENCOUNTER — Encounter: Payer: Self-pay | Admitting: Internal Medicine

## 2011-03-05 VITALS — BP 141/86 | HR 77 | Temp 98.8°F | Ht 63.0 in | Wt 190.3 lb

## 2011-03-05 DIAGNOSIS — I10 Essential (primary) hypertension: Secondary | ICD-10-CM

## 2011-03-05 DIAGNOSIS — E119 Type 2 diabetes mellitus without complications: Secondary | ICD-10-CM

## 2011-03-05 DIAGNOSIS — M79609 Pain in unspecified limb: Secondary | ICD-10-CM

## 2011-03-05 LAB — LIPID PANEL
Cholesterol: 174 mg/dL (ref 0–200)
HDL: 79 mg/dL (ref >39)
Triglycerides: 64 mg/dL (ref <150)

## 2011-03-05 LAB — BASIC METABOLIC PANEL WITH GFR
BUN: 13 mg/dL (ref 6–23)
CO2: 28 mEq/L (ref 19–32)
Chloride: 94 mEq/L — ABNORMAL LOW (ref 96–112)
Creat: 0.81 mg/dL (ref 0.50–1.10)

## 2011-03-05 LAB — GLUCOSE, CAPILLARY

## 2011-03-05 MED ORDER — ACCU-CHEK FASTCLIX LANCETS MISC: 1.0000 | Freq: Three times a day (TID) | Status: DC

## 2011-03-05 MED ORDER — LISINOPRIL 5 MG PO TABS: 5.0000 mg | ORAL_TABLET | Freq: Every day | ORAL | Status: DC

## 2011-03-05 MED ORDER — GLUCOSE BLOOD VI STRP: ORAL_STRIP | Status: AC

## 2011-03-05 MED ORDER — MELOXICAM 15 MG PO TABS: 15.0000 mg | ORAL_TABLET | Freq: Every day | ORAL | Status: DC

## 2011-03-05 MED ORDER — METFORMIN HCL 500 MG PO TABS: 500.0000 mg | ORAL_TABLET | Freq: Two times a day (BID) | ORAL | Status: DC

## 2011-03-05 MED ORDER — INSULIN NPH ISOPHANE & REGULAR (70-30) 100 UNIT/ML ~~LOC~~ SUSP: 25.0000 [IU] | Freq: Every day | SUBCUTANEOUS | Status: DC

## 2011-03-05 MED ORDER — ACCU-CHEK NANO SMARTVIEW W/DEVICE KIT: 1.0000 | PACK | Freq: Once | Status: DC

## 2011-03-05 NOTE — Progress Notes (Signed)
HPI: Rachel Gonzalez is a 60 year old woman with past medical history of diabetes type 2, osteoarthritis, bilateral foot pain presents today for hospital followup. Patient was seen in the ED on 02/26/2011 for myalgia, nausea or vomiting in which she was diagnosed with a viral gastroenteritis and a UTI. She was sent home with 5 day course of Cipro in which she has completed; however, her urine culture was essentially negative. She also denies any dysuria or burning sensation.  Today she reports that her nausea and vomiting is much improved and that she has been taking Zofran for her nausea. She denies any fevers, chills, diarrhea.  As for her diabetes, patient has not been compliant with her medication she only takes 20 units of insulin in the morning and often forgets to take her insulin dose at night (ie. She forgets every single night).  She states that she checks her blood sugar once a month.  She denies any hypoglycemic episode, numbness and tingling in her feet and hands.  Patient also has bilateral foot pain especially on the left side and has been managed by Dr. Delene Loll and that she was placed on tramadol for her foot pain; however, the tramadol does make her feel nauseous. She used to take 2 tablets however she has decreased it to one tablet daily along with Zofran. She states that she had an MRI of her left foot and would like to know the results.  ROS: as per HPI  PE: General: alert, well-developed, and cooperative to examination.  Lungs: normal respiratory effort, no accessory muscle use, normal breath sounds, no crackles, and no wheezes. Heart: normal rate, regular rhythm, no murmur, no gallop, and no rub.  Abdomen: soft, non-tender, normal bowel sounds, no distention, no guarding, no rebound tenderness Msk: no joint swelling, no joint warmth, and no redness over joints.  Pulses: 1-2+ DP/PT pulses bilaterally Extremities: No cyanosis, clubbing. Left foot- edema especially around ankle.  Tender  to palpation especially at tarsi sinus.  No erythema or drainage.   Neurologic: alert & oriented X3, cranial nerves II-XII intact, strength normal in all extremities, sensation intact to light touch, and gait normal.  Skin:no ulcers noted  Psych: flat affect

## 2011-03-05 NOTE — Patient Instructions (Addendum)
Increase your insulin to 25 units with breakfast and continue 15 units with supper Start taking metformin 500 mg by mouth twice a day  Start taking lisinopril 5 mg by mouth daily Please check his sugar at least 3 times a day in the next 2-3 weeks Diet-a small portion and decrease caloric intake and exercise at least 30 minutes 3 times per week You may also ice your right foot for 20 minutes 3 times a day to have decreased with the swelling Followup with Lupita Leash Plyler in 1-2 weeks Followup with Dr. Anselm Jungling in 2-3 weeks

## 2011-03-07 ENCOUNTER — Encounter: Payer: Self-pay | Admitting: Internal Medicine

## 2011-03-07 DIAGNOSIS — I10 Essential (primary) hypertension: Secondary | ICD-10-CM | POA: Insufficient documentation

## 2011-03-07 NOTE — Assessment & Plan Note (Signed)
Tendonitis vs. OA.  Recently had MRI of feet, being managed by Dr. Delene Loll. -Will try NSAIDs- Mobic 15mg  po qd -Try icing for 20 mins 2-3 times per day

## 2011-03-07 NOTE — Assessment & Plan Note (Addendum)
Uncontrolled. Her last HbA1c was above 12 in august 2011 and HbA1c today 12.3.  Patient is extremely non-compliant with her medications, she only takes insulin whenever she feels like it and only take the AM dose.  I had a long discussion with patient about the serious complications of uncontrolled diabetes. She voiced her understanding.  CBG in the office was critically high so the machine would not read; therefore, we had to send a BMP to main lab.  Patient was completely asymptomatic in the office.  Plan:  -Increase Novolin 70/30 to 25 units with breakfast and continue 15 units with supper. -Start taking metformin 500 mg by mouth twice a day  -Start taking lisinopril 5 mg by mouth daily -Will check urine microalbumin creatinine ratio next visit  -Please check her sugar at least 3 times a day in the next 2-3 weeks -Diet-a small portion and decrease caloric intake and exercise at least 30 minutes 3 times per week -Will need to see Diabetic educator Lupita Leash Plyler  Addendum: Patient has left the office when BMP results came back with a glucose of 655.  Patient was informed and she still reports asymptomatic. She states that she is at work and cannot come back to the office. She does agree to start the new drug regimen and will be compliant.  I will follow up with her in 2 wks. She was instructed to go to the ED if her symptoms become worst.

## 2011-03-07 NOTE — Assessment & Plan Note (Signed)
Not at goal <130/80 -Will start ACEi- Lisinopril 5mg  po qd

## 2011-03-19 ENCOUNTER — Ambulatory Visit: Payer: Self-pay | Admitting: Dietician

## 2011-03-27 ENCOUNTER — Encounter: Payer: Self-pay | Admitting: Internal Medicine

## 2011-03-31 ENCOUNTER — Ambulatory Visit (INDEPENDENT_AMBULATORY_CARE_PROVIDER_SITE_OTHER): Payer: PRIVATE HEALTH INSURANCE | Admitting: Internal Medicine

## 2011-03-31 ENCOUNTER — Encounter: Payer: Self-pay | Admitting: Internal Medicine

## 2011-03-31 VITALS — BP 157/89 | HR 81 | Temp 98.6°F | Ht 62.0 in | Wt 203.2 lb

## 2011-03-31 DIAGNOSIS — E119 Type 2 diabetes mellitus without complications: Secondary | ICD-10-CM

## 2011-03-31 DIAGNOSIS — M7989 Other specified soft tissue disorders: Secondary | ICD-10-CM

## 2011-03-31 DIAGNOSIS — I10 Essential (primary) hypertension: Secondary | ICD-10-CM

## 2011-03-31 LAB — GLUCOSE, CAPILLARY: Glucose-Capillary: 112 mg/dL — ABNORMAL HIGH (ref 70–99)

## 2011-03-31 MED ORDER — FUROSEMIDE 20 MG PO TABS: 20.0000 mg | ORAL_TABLET | Freq: Every day | ORAL | Status: DC

## 2011-03-31 MED ORDER — INSULIN NPH ISOPHANE & REGULAR (70-30) 100 UNIT/ML ~~LOC~~ SUSP: 5.0000 [IU] | Freq: Every day | SUBCUTANEOUS | Status: DC

## 2011-03-31 MED ORDER — ACCU-CHEK NANO SMARTVIEW W/DEVICE KIT: 1.0000 | PACK | Freq: Once | Status: DC

## 2011-03-31 NOTE — Assessment & Plan Note (Addendum)
HbA1c in Dec 2012 was 12.3.  CBG today is 112.  I think her diabetes is better controlled except for her hypoglycemia episodes in the morning. -decrease Novolin 70/30 from 15 units to 5 units at supper -Continue taking Novolin 70/30 25 units with breakfast -Continue Metformin 500mg  one tablet twice daily -Patient was instructed to buy sugar tablets and put it on her night stand so that she can take it quickly when she has low blood sugar. Or drink juice or eat crackers.   -I also encouraged her to check her sugar at least 2-3 times daily so I would have a better idea of how her blood sugar is doing throughout the day so I can adjust her regimen accordingly -Will follow up in 2 weeks

## 2011-03-31 NOTE — Progress Notes (Signed)
HPI: 61 yo W with DM presents today for follow up.  She states that she has been compliant with her medication and she has been taking 25 units of Novolog in morning and 15 units at supper, and Metformin 500mg  bid.  She endorses shaking, nervous, palpitation which wakes her up early in the morning.  She would eat snacks and drink soda and feels much better.  She has about 3 hypoglycemia episodes per week since starting the new regimen.  She does not have these symptoms during the day and her blood sugar usually 150's whenever she check it. She only checks about once per day.  She actually feels good after starting her diabetic medications.  She states that Mobic does not really work for her foot pain. She was seen by podiatrist and was told that her leg pain/swelling is likely due to osteoarthritis.  She has bilateral leg swelling in the past several weeks and it is hurting and that she can't even fit her shoes. She denies any SOB or chestpain.   No other complaints. She wants her FMLA form to be filled out.  ROS: as per HPI  PE: General: alert, well-developed, and cooperative to examination.   Lungs: normal respiratory effort, no accessory muscle use, normal breath sounds, no crackles, and no wheezes. Heart: normal rate, regular rhythm, no murmur, no gallop, and no rub.  Abdomen: soft, non-tender, normal bowel sounds, no distention, no guarding, Extremities: No cyanosis, clubbing, +2 pitting edema on LE up to knee Neurologic: alert & oriented X3, cranial nerves II-XII intact, strength normal in all extremities, sensation intact to light touch, and gait normal.

## 2011-03-31 NOTE — Assessment & Plan Note (Signed)
Not well-controlled.   -Will continue Lisinopril 5mg  -Add Lasix 20mg 

## 2011-03-31 NOTE — Patient Instructions (Signed)
Please decrease your Novolin 70/30 to 5 units at supper Continue taking Novolin 70/30 25 units with breakfast Continue Metformin 500mg  one tablet twice daily Start taking Lasix 20mg  one tablet daily Follow up with Dr. Anselm Jungling in 1-2 weeks

## 2011-03-31 NOTE — Assessment & Plan Note (Signed)
Likely 2/2 venous stasis vs. HF?  No Echocardiogram in EPIC or Echart.  She denies any SOB or chest pain -Will get 2D echo -Try Lasix 20mg  qd -Follow up in 1-2 weeks for BMP to make sure electrolytes are WNL

## 2011-04-01 LAB — MICROALBUMIN / CREATININE URINE RATIO
Creatinine, Urine: 169.1 mg/dL
Microalb Creat Ratio: 54.1 mg/g — ABNORMAL HIGH (ref 0.0–30.0)
Microalb, Ur: 9.14 mg/dL — ABNORMAL HIGH (ref 0.00–1.89)

## 2011-04-01 NOTE — Progress Notes (Signed)
Addended by: Angelina Ok F on: 04/01/2011 10:52 AM   Modules accepted: Orders

## 2011-04-02 NOTE — Progress Notes (Signed)
2D echo sch at [facility] 04/14/11 11AM - pt aware. Stanton Kidney Jemila Camille RN 04/02/11 3:30PM

## 2011-04-14 ENCOUNTER — Ambulatory Visit (HOSPITAL_COMMUNITY)
Admission: RE | Admit: 2011-04-14 | Discharge: 2011-04-14 | Disposition: A | Discharge status: Home / Self Care | Payer: Managed Care, Other (non HMO) | Source: Ambulatory Visit | Attending: Internal Medicine | Admitting: Internal Medicine

## 2011-04-14 DIAGNOSIS — I10 Essential (primary) hypertension: Secondary | ICD-10-CM

## 2011-04-14 DIAGNOSIS — E119 Type 2 diabetes mellitus without complications: Secondary | ICD-10-CM | POA: Insufficient documentation

## 2011-04-14 DIAGNOSIS — I509 Heart failure, unspecified: Secondary | ICD-10-CM | POA: Insufficient documentation

## 2011-04-14 DIAGNOSIS — M7989 Other specified soft tissue disorders: Secondary | ICD-10-CM

## 2011-04-14 NOTE — Progress Notes (Signed)
  Echocardiogram 2D Echocardiogram has been performed.  Rachel Gonzalez [facility] 04/14/2011, 1:50 PM

## 2011-06-17 ENCOUNTER — Encounter: Payer: Self-pay | Admitting: Internal Medicine

## 2011-06-17 ENCOUNTER — Ambulatory Visit (INDEPENDENT_AMBULATORY_CARE_PROVIDER_SITE_OTHER): Payer: PRIVATE HEALTH INSURANCE | Admitting: Internal Medicine

## 2011-06-17 VITALS — BP 144/83 | HR 86 | Temp 97.3°F | Ht 62.0 in | Wt 196.7 lb

## 2011-06-17 DIAGNOSIS — E119 Type 2 diabetes mellitus without complications: Secondary | ICD-10-CM

## 2011-06-17 DIAGNOSIS — Z79899 Other long term (current) drug therapy: Secondary | ICD-10-CM

## 2011-06-17 NOTE — Progress Notes (Signed)
Patient ID: Rachel Gonzalez, female   DOB: 06/26/1950, 60 y.o.   MRN: [id number]  61 Y/o woman with pmh listed below comes for [facility] paperwork for medical clearance for driving license  No complaints today Complaint with medications No medications side effect Uptodate on refills See individual a/p for further details.   Physical exam  General Appearance:     Filed Vitals:   06/17/11 0901  BP: 144/83  Pulse: 86  Temp: 97.3 F (36.3 C)  TempSrc: Oral  Height: 5\' 2"  (1.575 m)  Weight: 196 lb 11.2 oz (89.223 kg)     Alert, cooperative, no distress, appears stated age  Head:    Normocephalic, without obvious abnormality, atraumatic  Eyes:    PERRL, conjunctiva/corneas clear, EOM's intact, fundi    benign, both eyes       Neck:   Supple, symmetrical, trachea midline, no adenopathy;       thyroid:  No enlargement/tenderness/nodules; no carotid   bruit or JVD  Lungs:     Clear to auscultation bilaterally, respirations unlabored  Chest wall:    No tenderness or deformity  Heart:    Regular rate and rhythm, S1 and S2 normal, no murmur, rub   or gallop  Abdomen:     Soft, non-tender, bowel sounds active all four quadrants,    no masses, no organomegaly  Extremities:   Extremities normal, atraumatic, no cyanosis or edema  Pulses:   2+ and symmetric all extremities  Skin:   Skin color, texture, turgor normal, no rashes or lesions  Neurologic:  nonfocal grossly    ROS  Constitutional: Denies fever, chills, diaphoresis, appetite change and fatigue.  Respiratory: Denies SOB, DOE, cough, chest tightness,  and wheezing.   Cardiovascular: Denies chest pain, palpitations and leg swelling.  Gastrointestinal: Denies nausea, vomiting, abdominal pain, diarrhea, constipation, blood in stool and abdominal distention.  Skin: Denies pallor, rash and wound.  Neurological: Denies dizziness, light-headedness, numbness and headaches.

## 2011-06-17 NOTE — Assessment & Plan Note (Addendum)
DMV paperwork outlining her diabetes status and management completed Patient is seen her ophthalmologist tomorrow for paperwork related to diabetic retinopathy Followup with PCP in 1-2 months

## 2011-07-18 ENCOUNTER — Encounter (HOSPITAL_COMMUNITY): Payer: Self-pay

## 2011-07-18 ENCOUNTER — Emergency Department (HOSPITAL_COMMUNITY)
Admission: EM | Admit: 2011-07-18 | Discharge: 2011-07-18 | Disposition: A | Discharge status: Home / Self Care | Payer: Self-pay | Source: Home / Self Care | Attending: Emergency Medicine | Admitting: Emergency Medicine

## 2011-07-18 DIAGNOSIS — M79609 Pain in unspecified limb: Secondary | ICD-10-CM

## 2011-07-18 DIAGNOSIS — M79676 Pain in unspecified toe(s): Secondary | ICD-10-CM

## 2011-07-18 NOTE — ED Provider Notes (Signed)
History     CSN: [id number]  Arrival date & time 07/18/11  1713   First MD Initiated Contact with Patient 07/18/11 1717      Chief Complaint  Patient presents with  . Toe Pain    (Consider location/radiation/quality/duration/timing/severity/associated sxs/prior treatment) HPI Comments: For about a week of being feeling this shooting pains in my right great toe "they come and go".a week ago he had some pedicure work as I remove some polyps from light toenail and have noticed this darkened area underneath my nail. No my toe doesn't hurt now the pains come and go. No swelling.  Patient is a 62 y.o. female presenting with toe pain. The history is provided by the patient.  Toe Pain This is a new problem. The current episode started more than 1 week ago. The problem occurs constantly. The problem has not changed since onset.The symptoms are aggravated by nothing. The symptoms are relieved by nothing.    Past Medical History  Diagnosis Date  . Diabetes mellitus   . HTN (hypertension) 03/07/2011    Past Surgical History  Procedure Date  . Arm surgery      Family History  Problem Relation Age of Onset  . Diabetes Other     History  Substance Use Topics  . Smoking status: Never Smoker   . Smokeless tobacco: Not on file  . Alcohol Use: No    OB History    Grav Para Term Preterm Abortions TAB SAB Ect Mult Living                  Review of Systems  Constitutional: Negative for fever and chills.  Skin: Negative for rash.    Allergies  Tramadol  Home Medications   Current Outpatient Rx  Name Route Sig Dispense Refill  . ACCU-CHEK FASTCLIX LANCETS MISC Does not apply 1 Device by Does not apply route 3 (three) times daily. 100 each 11  . ACCU-CHEK NANO SMARTVIEW W/DEVICE KIT Does not apply 1 Device by Does not apply route once. 1 kit 0  . FUROSEMIDE 20 MG PO TABS Oral Take 1 tablet (20 mg total) by mouth daily. 30 tablet 3  . GLUCOSE BLOOD VI STRP  Use as instructed  100 each 12  . INSULIN ISOPHANE & REGULAR (70-30) 100 UNIT/ML [place] SUSP Subcutaneous Inject 25 Units into the skin daily with breakfast. 10 mL 3  . INSULIN ISOPHANE & REGULAR (70-30) 100 UNIT/ML  SUSP Subcutaneous Inject 5 Units into the skin daily with supper. 10 mL 11  . LISINOPRIL 5 MG PO TABS Oral Take 1 tablet (5 mg total) by mouth daily. 30 tablet 3  . METFORMIN HCL 500 MG PO TABS Oral Take 1 tablet (500 mg total) by mouth 2 (two) times daily with a meal. 60 tablet 3    BP 168/100  Pulse 96  Temp(Src) 98.4 F (36.9 C) (Oral)  Resp 20  SpO2 98%  Physical Exam  Nursing note and vitals reviewed. Constitutional: She appears well-developed and well-nourished.  Musculoskeletal: She exhibits no edema and no tenderness.       Feet:  Skin: Skin is warm. No erythema.    ED Course  Procedures (including critical care time)  Labs Reviewed - No data to display No results found.   1. Toe pain       MDM  Patient has no signs of paronychia or a prolonged period has a minimally discrete subfoveal hyperpigmentation possibly a subungual hematoma reabsorbing. Patient has known  peripheral neuropathy and is describing shooting pains of his right route great toe. Patient is not tender with palpation and significant pressure on her toe in any area. Suspect patient has advancing peripheral neuropathy and 6 developing small sub-ungeal hematoma as she is walking with inappropriate shoe wear. Patient admits that she's her diabetes not under control and has very pull diet control. Does have a primary care doctor and has been told that she has nerve damage in her feet. Additional intervention explained to patient for about 1520 minutes about the relationship between diabetes and peripheral neuropathy. Patient somewhat understands this information and agrees to followup with her doctor        Jimmie Molly, MD 07/18/11 [phone]

## 2011-07-18 NOTE — ED Notes (Signed)
Pt states she a pedicure one week ago and noticed that her rt great toe was painful, removed the polish and has darkened area under the nailbed and redness around the nailbed.

## 2011-07-18 NOTE — Discharge Instructions (Signed)
Rachel Gonzalez afraid your exam and your symptoms are consistent with early signs of peripheral neuropathy as we see an uncontrolled diabetes. The change in pigmentation in color underneath her toenail is indicative of small suitable new hematomas as explained. Try to be very conscious about what shoe wear you use as you might be losing sensitivity in your feet. Did talk to your Dr. your interested to tighten and improve your diabetes and will solve discussed the importance of diet control as you knowledge yourself that you are not doing very good in that area and that is essential for good diabetes control

## 2011-08-11 ENCOUNTER — Encounter: Payer: PRIVATE HEALTH INSURANCE | Admitting: Internal Medicine

## 2011-09-01 ENCOUNTER — Encounter: Payer: PRIVATE HEALTH INSURANCE | Admitting: Internal Medicine

## 2011-10-31 ENCOUNTER — Encounter (HOSPITAL_COMMUNITY): Payer: Self-pay | Admitting: *Deleted

## 2011-10-31 ENCOUNTER — Emergency Department (INDEPENDENT_AMBULATORY_CARE_PROVIDER_SITE_OTHER)
Admission: EM | Admit: 2011-10-31 | Discharge: 2011-10-31 | Disposition: A | Discharge status: Home / Self Care | Payer: PRIVATE HEALTH INSURANCE | Source: Home / Self Care | Attending: Family Medicine | Admitting: Family Medicine

## 2011-10-31 DIAGNOSIS — N760 Acute vaginitis: Secondary | ICD-10-CM

## 2011-10-31 DIAGNOSIS — A499 Bacterial infection, unspecified: Secondary | ICD-10-CM

## 2011-10-31 LAB — POCT URINALYSIS DIP (DEVICE)
Nitrite: NEGATIVE
Protein, ur: NEGATIVE mg/dL
Urobilinogen, UA: 0.2 mg/dL (ref 0.0–1.0)
pH: 5.5 (ref 5.0–8.0)

## 2011-10-31 LAB — WET PREP, GENITAL

## 2011-10-31 MED ORDER — FLUCONAZOLE 150 MG PO TABS: 150.0000 mg | ORAL_TABLET | Freq: Once | ORAL | Status: AC

## 2011-10-31 MED ORDER — METRONIDAZOLE 500 MG PO TABS: 500.0000 mg | ORAL_TABLET | Freq: Two times a day (BID) | ORAL | Status: AC

## 2011-10-31 NOTE — ED Notes (Signed)
Pt reports vaginal itching for the past week (used otc med with some relief) and left foot pain for the past 2 years (has been seen by multiple md and has had previous xrays with no relief)

## 2011-10-31 NOTE — ED Provider Notes (Signed)
History     CSN: [id number]  Arrival date & time 10/31/11  1447   None     Chief Complaint  Patient presents with  . Vaginal Itching  . Foot Pain    (Consider location/radiation/quality/duration/timing/severity/associated sxs/prior treatment) Patient is a 61 y.o. female presenting with vaginal itching and lower extremity pain. The history is provided by the patient.  Vaginal Itching Associated symptoms include abdominal pain.  Foot Pain Associated symptoms include abdominal pain.  61 y.o. female complains of vaginal itching for 3 days, not improved with OTC monistat cream for what she believed to be a yeast infection associated with crampy lower abdominal pain.  Denies vaginal discharge, abnormal vaginal bleeding, significant pelvic pain or fever. No UTI symptoms.  Not sexually active, last intercourse one year ago.  Denies history of known exposure to STD or symptoms in partner.  No LMP recorded. Patient is postmenopausal.  No history of STD's.  Patient is diabetic, reports CBG WNL.   Past Medical History  Diagnosis Date  . Diabetes mellitus   . HTN (hypertension) 03/07/2011    Past Surgical History  Procedure Date  . Arm surgery      Family History  Problem Relation Age of Onset  . Diabetes Other     History  Substance Use Topics  . Smoking status: Never Smoker   . Smokeless tobacco: Not on file  . Alcohol Use: No    OB History    Grav Para Term Preterm Abortions TAB SAB Ect Mult Living                  Review of Systems  Constitutional: Negative.   Respiratory: Negative.   Cardiovascular: Negative.   Gastrointestinal: Positive for abdominal pain. Negative for nausea, vomiting, diarrhea, constipation, blood in stool, abdominal distention and rectal pain.  Musculoskeletal: Negative.   Skin: Negative.     Allergies  Tramadol  Home Medications   Current Outpatient Rx  Name Route Sig Dispense Refill  . ACCU-CHEK FASTCLIX LANCETS MISC Does not apply 1  Device by Does not apply route 3 (three) times daily. 100 each 11  . ACCU-CHEK NANO SMARTVIEW W/DEVICE KIT Does not apply 1 Device by Does not apply route once. 1 kit 0  . GLUCOSE BLOOD VI STRP  Use as instructed 100 each 12  . INSULIN ISOPHANE & REGULAR (70-30) 100 UNIT/ML [place] SUSP Subcutaneous Inject 25 Units into the skin daily with breakfast. 10 mL 3  . FLUCONAZOLE 150 MG PO TABS Oral Take 1 tablet (150 mg total) by mouth once. 1 tablet 0  . FUROSEMIDE 20 MG PO TABS Oral Take 1 tablet (20 mg total) by mouth daily. 30 tablet 3  . INSULIN ISOPHANE & REGULAR (70-30) 100 UNIT/ML [place] SUSP Subcutaneous Inject 5 Units into the skin daily with supper. 10 mL 11  . LISINOPRIL 5 MG PO TABS Oral Take 1 tablet (5 mg total) by mouth daily. 30 tablet 3  . METFORMIN HCL 500 MG PO TABS Oral Take 1 tablet (500 mg total) by mouth 2 (two) times daily with a meal. 60 tablet 3  . METRONIDAZOLE 500 MG PO TABS Oral Take 1 tablet (500 mg total) by mouth 2 (two) times daily. 14 tablet 0    BP 168/90  Pulse 90  Temp 99.4 F (37.4 C) (Oral)  Resp 20  SpO2 97%  Physical Exam  Nursing note and vitals reviewed. Constitutional: She is oriented to person, place, and time. Vital signs are normal.  She appears well-developed and well-nourished. She is active and cooperative.  HENT:  Head: Normocephalic.  Eyes: Conjunctivae are normal. Pupils are equal, round, and reactive to light. No scleral icterus.  Neck: Trachea normal. Neck supple.  Cardiovascular: Normal rate, regular rhythm and normal heart sounds.   Pulmonary/Chest: Effort normal and breath sounds normal. No respiratory distress.  Abdominal: Soft. Bowel sounds are normal. There is no tenderness. There is no guarding.  Genitourinary: Uterus normal. Pelvic exam was performed with patient supine. No labial fusion. There is no rash, tenderness or lesion on the right labia. There is no rash, tenderness or lesion on the left labia. Cervix exhibits no motion tenderness,  no discharge and no friability. Right adnexum displays no mass, no tenderness and no fullness. Left adnexum displays no mass, no tenderness and no fullness. No erythema, tenderness or bleeding around the vagina. No foreign body around the vagina. Vaginal discharge found.  Neurological: She is alert and oriented to person, place, and time. No cranial nerve deficit or sensory deficit.  Skin: Skin is warm and dry.  Psychiatric: She has a normal mood and affect. Her speech is normal and behavior is normal. Judgment and thought content normal. Cognition and memory are normal.    ED Course  Procedures (including critical care time)  Labs Reviewed  POCT URINALYSIS DIP (DEVICE) - Abnormal; Notable for the following:    Glucose, UA 100 (*)     Hgb urine dipstick SMALL (*)     All other components within normal limits  WET PREP, GENITAL - Abnormal; Notable for the following:    Clue Cells Wet Prep HPF POC FEW (*)     WBC, Wet Prep HPF POC MODERATE (*)     All other components within normal limits   No results found.   1. Vaginitis   2. Bacterial vaginitis       MDM  Follow up with your primary care provider this week for re-evaluation of your blood pressure and appropriate management.  Take antibiotic as prescribed, diflucan on day four or five.  Return if symptoms are not improved.          Johnsie Kindred, NP 10/31/11 1739

## 2011-11-01 NOTE — ED Provider Notes (Signed)
Medical screening examination/treatment/procedure(s) were performed by resident physician or non-physician practitioner and as supervising physician I was immediately available for consultation/collaboration.   Barkley Bruns MD.    Linna Hoff, MD 11/01/11 [phone]

## 2011-11-02 NOTE — ED Notes (Signed)
Wet prep: few clue cells, mod. WBC's.  Pt. adequately treated with Flagyl. Vassie Moselle 11/02/2011

## 2012-04-21 ENCOUNTER — Ambulatory Visit: Payer: PRIVATE HEALTH INSURANCE | Admitting: Internal Medicine

## 2012-04-25 ENCOUNTER — Ambulatory Visit: Payer: PRIVATE HEALTH INSURANCE | Admitting: Internal Medicine

## 2012-08-21 ENCOUNTER — Emergency Department (HOSPITAL_COMMUNITY)
Admission: EM | Admit: 2012-08-21 | Discharge: 2012-08-21 | Disposition: A | Discharge status: Home / Self Care | Payer: Self-pay | Attending: Emergency Medicine | Admitting: Emergency Medicine

## 2012-08-21 ENCOUNTER — Encounter (HOSPITAL_COMMUNITY): Payer: Self-pay | Admitting: Emergency Medicine

## 2012-08-21 DIAGNOSIS — E119 Type 2 diabetes mellitus without complications: Secondary | ICD-10-CM | POA: Insufficient documentation

## 2012-08-21 DIAGNOSIS — G8929 Other chronic pain: Secondary | ICD-10-CM | POA: Insufficient documentation

## 2012-08-21 DIAGNOSIS — I10 Essential (primary) hypertension: Secondary | ICD-10-CM | POA: Insufficient documentation

## 2012-08-21 DIAGNOSIS — Z794 Long term (current) use of insulin: Secondary | ICD-10-CM | POA: Insufficient documentation

## 2012-08-21 MED ORDER — MELOXICAM 15 MG PO TABS: 15.0000 mg | ORAL_TABLET | Freq: Every day | ORAL | Status: DC

## 2012-08-21 NOTE — ED Notes (Signed)
Patient stated that she has been having pain to the rt side of her neck down to her shoulder that radiates to her arm, rt side of the back all the way to her knee. She also stated that she has limited ROM to her rt shoulder. Patient also stated that her lt foot is painful.

## 2012-08-21 NOTE — ED Notes (Signed)
scarlett aware of patients elevated blood pressure.

## 2012-08-21 NOTE — ED Notes (Signed)
Pt. Stated, I continue to have feet and rt. Arm pain for 6 years.

## 2012-08-21 NOTE — ED Provider Notes (Signed)
History    This chart was scribed for Doran Durand, non-physician practitioner working with Richardean Canal, MD by Leone Payor, ED Scribe. This patient was seen in room TR05C/TR05C and the patient's care was started at 1445.   CSN: [id number]  Arrival date & time 08/21/12  1445   First MD Initiated Contact with Patient 08/21/12 1708      Chief Complaint  Patient presents with  . Generalized Body Aches     The history is provided by the patient. No language interpreter was used.    HPI Comments: Rachel Gonzalez is a 62 y.o. female who presents to the Emergency Department complaining of constant "all over" body pain that has been ongoing for several years but has worsened in the last 3 days. She has been seen for similar symptoms and was diagnosed with arthritis. She was prescribed medications that helped control the pain but she does not know what those medications are. She has associated chills, diarrhea. She has been taking ibuprofen without relief. Pain is alleviated with a heating pad. She has h/o arm surgery. She denies fever, nausea, vomiting, headache.  Denies any swelling or erythema or extremities.     Past Medical History  Diagnosis Date  . Diabetes mellitus   . HTN (hypertension) 03/07/2011    Past Surgical History  Procedure Laterality Date  . Arm surgery       Family History  Problem Relation Age of Onset  . Diabetes Other     History  Substance Use Topics  . Smoking status: Never Smoker   . Smokeless tobacco: Not on file  . Alcohol Use: No    OB History   Grav Para Term Preterm Abortions TAB SAB Ect Mult Living                  Review of Systems  Constitutional: Positive for chills. Negative for fever.  Respiratory: Negative for shortness of breath.   Gastrointestinal: Positive for diarrhea. Negative for nausea and vomiting.  Musculoskeletal: Positive for arthralgias (feet and R arm pain).  All other systems reviewed and are  negative.    Allergies  Tramadol  Home Medications   Current Outpatient Rx  Name  Route  Sig  Dispense  Refill  . insulin NPH-regular (NOVOLIN 70/30) (70-30) 100 UNIT/ML injection   Subcutaneous   Inject 15-20 Units into the skin 2 (two) times daily with a meal. 20 units in the a.m. And 15 units in the p.m.           BP 150/84  Pulse 74  Temp(Src) 98.2 F (36.8 C) (Oral)  Resp 16  SpO2 98%  Physical Exam  Nursing note and vitals reviewed. Constitutional: She is oriented to person, place, and time. She appears well-developed and well-nourished. No distress.  HENT:  Head: Normocephalic and atraumatic.  Eyes: Conjunctivae and EOM are normal. Pupils are equal, round, and reactive to light.  Neck: Normal range of motion. Neck supple.  Cardiovascular: Normal rate, regular rhythm, normal heart sounds and intact distal pulses.   Intact distal pulses, capillary refill < 3 seconds. Good DP pulses.   Pulmonary/Chest: Effort normal and breath sounds normal. No respiratory distress. She has no wheezes.  Musculoskeletal: She exhibits tenderness.  Limited ROM of R shoulder. Good ROM of elbow and wrist. No swelling or deformity of the R shoulder. Tender to palpation of the R shoulder. No spinal tenderness. R knee is tender to palpation, no swelling noted. R ankle is tender  to palpation, no swelling noted. L ankle is tender to palpation, no swelling.   Neurological: She is alert and oriented to person, place, and time.  No sensory deficit  Skin: Skin is warm, dry and intact. No lesion noted. She is not diaphoretic. No erythema.  Skin intact, no tenting    ED Course  Procedures (including critical care time)  DIAGNOSTIC STUDIES: Oxygen Saturation is 97% on RA, adequate by my interpretation.    COORDINATION OF CARE: 7:05 PM Discussed treatment plan with pt at bedside and pt agreed to plan.   Labs Reviewed - No data to display No results found.   No diagnosis found.    MDM   Patient presenting with chronic pain "all over" that has been present for years.  Review of the chart shows that she has been prescribed Mobic in the past, which she felt helped.  Full ROM of extremities.  No erythema or swelling of extremities.  Patient able to ambulate without difficulty.  Therefore, feel that the patient is stable for discharge.  I personally performed the services described in this documentation, which was scribed in my presence. The recorded information has been reviewed and is accurate.    Pascal Lux Eldred, PA-C 08/22/12 [phone]

## 2012-08-26 NOTE — ED Provider Notes (Signed)
Medical screening examination/treatment/procedure(s) were performed by non-physician practitioner and as supervising physician I was immediately available for consultation/collaboration.   David H Yao, MD 08/26/12 0922 

## 2012-09-15 ENCOUNTER — Other Ambulatory Visit: Payer: Self-pay

## 2012-11-01 ENCOUNTER — Emergency Department (HOSPITAL_COMMUNITY)
Admission: EM | Admit: 2012-11-01 | Discharge: 2012-11-01 | Disposition: A | Discharge status: Home / Self Care | Payer: Self-pay | Attending: Emergency Medicine | Admitting: Emergency Medicine

## 2012-11-01 ENCOUNTER — Encounter (HOSPITAL_COMMUNITY): Payer: Self-pay | Admitting: Emergency Medicine

## 2012-11-01 DIAGNOSIS — I10 Essential (primary) hypertension: Secondary | ICD-10-CM | POA: Insufficient documentation

## 2012-11-01 DIAGNOSIS — E119 Type 2 diabetes mellitus without complications: Secondary | ICD-10-CM | POA: Insufficient documentation

## 2012-11-01 DIAGNOSIS — R52 Pain, unspecified: Secondary | ICD-10-CM | POA: Insufficient documentation

## 2012-11-01 DIAGNOSIS — IMO0001 Reserved for inherently not codable concepts without codable children: Secondary | ICD-10-CM | POA: Insufficient documentation

## 2012-11-01 DIAGNOSIS — J069 Acute upper respiratory infection, unspecified: Secondary | ICD-10-CM | POA: Insufficient documentation

## 2012-11-01 DIAGNOSIS — R05 Cough: Secondary | ICD-10-CM

## 2012-11-01 DIAGNOSIS — Z794 Long term (current) use of insulin: Secondary | ICD-10-CM | POA: Insufficient documentation

## 2012-11-01 DIAGNOSIS — H9201 Otalgia, right ear: Secondary | ICD-10-CM

## 2012-11-01 DIAGNOSIS — R059 Cough, unspecified: Secondary | ICD-10-CM | POA: Insufficient documentation

## 2012-11-01 DIAGNOSIS — R110 Nausea: Secondary | ICD-10-CM | POA: Insufficient documentation

## 2012-11-01 DIAGNOSIS — H9209 Otalgia, unspecified ear: Secondary | ICD-10-CM | POA: Insufficient documentation

## 2012-11-01 DIAGNOSIS — J45909 Unspecified asthma, uncomplicated: Secondary | ICD-10-CM | POA: Insufficient documentation

## 2012-11-01 DIAGNOSIS — G8929 Other chronic pain: Secondary | ICD-10-CM | POA: Insufficient documentation

## 2012-11-01 MED ORDER — MELOXICAM 7.5 MG PO TABS: 15.0000 mg | ORAL_TABLET | Freq: Every day | ORAL | Status: DC

## 2012-11-01 MED ORDER — PSEUDOEPHEDRINE HCL 60 MG PO TABS
30.0000 mg | ORAL_TABLET | Freq: Four times a day (QID) | ORAL | Status: DC | PRN
Start: 2012-11-01 — End: 2013-04-16

## 2012-11-01 NOTE — ED Provider Notes (Signed)
CSN: [id number]     Arrival date & time 11/01/12  1600 History   This chart was scribed for non-physician practitioner, Junius Finner, PA-C working with Flint Melter, MD by Valera Castle, ED scribe. This patient was seen in room WTR8/WTR8 and the patient's care was started at 4:21 PM.   Chief Complaint  Patient presents with  . Facial Pain    The history is provided by the patient. No language interpreter was used.   HPI Comments: Rachel Gonzalez is a 62 y.o. Female with a h/o asthma who presents to the Emergency Department with generalized right sided body aches onset 7 days ago with a severity of 10/10.She has had a unproductive cough for 3 days and nausea. Pt put heat on her right arm this morning, and the severity is now 8/10. Pt has history of shoulder and elbow pain from a previous accident. Pt denies fever, emesis, back pain, and abdominal pain. She has no allergies. Pt has been taking Ibuprofen for allergies. Pt no longer has PCP. Pt has no history of travel or current family sickness.    Past Medical History  Diagnosis Date  . Diabetes mellitus   . HTN (hypertension) 03/07/2011   Past Surgical History  Procedure Laterality Date  . Arm surgery      Family History  Problem Relation Age of Onset  . Diabetes Other    History  Substance Use Topics  . Smoking status: Never Smoker   . Smokeless tobacco: Not on file  . Alcohol Use: No   OB History   Grav Para Term Preterm Abortions TAB SAB Ect Mult Living                 Review of Systems  Constitutional: Negative for fever.  Respiratory: Positive for cough.   Gastrointestinal: Positive for nausea. Negative for vomiting and abdominal pain.  Musculoskeletal: Positive for myalgias. Negative for back pain.  All other systems reviewed and are negative.    Allergies  Tramadol  Home Medications   Current Outpatient Rx  Name  Route  Sig  Dispense  Refill  . insulin NPH-regular (NOVOLIN 70/30) (70-30) 100 UNIT/ML  injection   Subcutaneous   Inject 15-20 Units into the skin 2 (two) times daily with a meal. 20 units in the a.m. And 15 units in the p.m.         Marland Kitchen meloxicam (MOBIC) 7.5 MG tablet   Oral   Take 2 tablets (15 mg total) by mouth daily.   30 tablet   0   . pseudoephedrine (SUDAFED) 60 MG tablet   Oral   Take 0.5 tablets (30 mg total) by mouth every 6 (six) hours as needed for congestion.   20 tablet   0    BP 110/70  Pulse 81  Temp(Src) 99 F (37.2 C) (Oral)  Resp 16  SpO2 99%  Physical Exam  Nursing note and vitals reviewed. Constitutional: She is oriented to person, place, and time. She appears well-developed and well-nourished.  HENT:  Head: Normocephalic and atraumatic.  Tonsillar erythema without edema or exudate.   Eyes: EOM are normal.  Neck: Normal range of motion. Neck supple.  Cardiovascular: Normal rate and regular rhythm.   No murmur heard. Pulmonary/Chest: Effort normal and breath sounds normal. No respiratory distress. She has no wheezes. She has no rales.  Occasonial prodcutive cough. No respiratory distress and will speak in full sentences. No wheezes or ronchi. Lungs CTAB.   Abdominal: Soft. There  is no tenderness.  Musculoskeletal: Normal range of motion. She exhibits tenderness.  Limited abduction of right shoulder (chronic). Well healed surgical scar on right elbow. Tenderness to palpation over right shoulder, right hip, and right lateral thigh.   Neurological: She is alert and oriented to person, place, and time.  Skin: Skin is warm and dry.  Psychiatric: She has a normal mood and affect. Her behavior is normal.     ED Course  Procedures (including critical care time)  DIAGNOSTIC STUDIES: Oxygen Saturation is 99% on room air, normal by my interpretation.    COORDINATION OF CARE:  4:50 PM-Discussed treatment plan which includes cough medication and Mobic with pt at bedside and pt agreed to plan.    Labs Review Labs Reviewed - No data to  display Imaging Review No results found.  MDM   1. Right ear pain   2. Chronic generalized pain   3. Cough   4. Acute URI    Pt has hx of chronic generalized pain.  Today, presenting with URI, likely caused exacerbation of chronic generalized body aches.  Pt appears non-toxic. No respiratory distress. No further workup needed at this time.  Rx: mobic. Will discharge pt home and have her f/u with [facility] and [facility] info provided. Return precautions given. Pt verbalized understanding and agreement with tx plan. Vitals: unremarkable. Discharged in stable condition.     I personally performed the services described in this documentation, which was scribed in my presence. The recorded information has been reviewed and is accurate.     Junius Finner, PA-C 11/03/12 1631

## 2012-11-01 NOTE — ED Notes (Signed)
Pt c/o sinus pain x1 week.  Pt sts "I'm just hurting all over".

## 2012-11-05 NOTE — ED Provider Notes (Signed)
Medical screening examination/treatment/procedure(s) were performed by non-physician practitioner and as supervising physician I was immediately available for consultation/collaboration.  Idil Maslanka L Lyn Deemer, MD 11/05/12 2313 

## 2013-02-20 ENCOUNTER — Emergency Department (HOSPITAL_COMMUNITY)
Admission: EM | Admit: 2013-02-20 | Discharge: 2013-02-21 | Disposition: A | Discharge status: Home / Self Care | Payer: PRIVATE HEALTH INSURANCE | Attending: Emergency Medicine | Admitting: Emergency Medicine

## 2013-02-20 ENCOUNTER — Encounter (HOSPITAL_COMMUNITY): Payer: Self-pay | Admitting: Emergency Medicine

## 2013-02-20 DIAGNOSIS — K089 Disorder of teeth and supporting structures, unspecified: Secondary | ICD-10-CM | POA: Insufficient documentation

## 2013-02-20 DIAGNOSIS — E119 Type 2 diabetes mellitus without complications: Secondary | ICD-10-CM | POA: Insufficient documentation

## 2013-02-20 DIAGNOSIS — K0889 Other specified disorders of teeth and supporting structures: Secondary | ICD-10-CM

## 2013-02-20 DIAGNOSIS — Z794 Long term (current) use of insulin: Secondary | ICD-10-CM | POA: Insufficient documentation

## 2013-02-20 DIAGNOSIS — Z791 Long term (current) use of non-steroidal anti-inflammatories (NSAID): Secondary | ICD-10-CM | POA: Insufficient documentation

## 2013-02-20 NOTE — ED Notes (Signed)
Patient states that she has pain to the bottom right jaw.  States her tooth is hurting and the one behind it is broken

## 2013-02-21 MED ORDER — HYDROCODONE-ACETAMINOPHEN 5-325 MG PO TABS: 1.0000 | ORAL_TABLET | ORAL | Status: DC | PRN

## 2013-02-21 MED ORDER — PENICILLIN V POTASSIUM 500 MG PO TABS: 500.0000 mg | ORAL_TABLET | Freq: Three times a day (TID) | ORAL | Status: DC

## 2013-02-21 MED ORDER — ONDANSETRON HCL 8 MG PO TABS: 8.0000 mg | ORAL_TABLET | Freq: Three times a day (TID) | ORAL | Status: DC | PRN

## 2013-02-21 NOTE — ED Provider Notes (Signed)
CSN: [id number]     Arrival date & time 02/20/13  2259 History   First MD Initiated Contact with Patient 02/20/13 2359     Chief Complaint  Patient presents with  . Dental Pain   (Consider location/radiation/quality/duration/timing/severity/associated sxs/prior Treatment) HPI History provided by pt.   Pt c/o severe right lower dental pain since yesterday.  Aggravated by palpation.  No relief w/ tylenol.  No associated fever.  Was supposed to have this tooth extracted but lost dental insurance.  Past Medical History  Diagnosis Date  . Diabetes mellitus    Past Surgical History  Procedure Laterality Date  . Arm surgery     . Cesarean section     Family History  Problem Relation Age of Onset  . Diabetes Other    History  Substance Use Topics  . Smoking status: Never Smoker   . Smokeless tobacco: Not on file  . Alcohol Use: No   OB History   Grav Para Term Preterm Abortions TAB SAB Ect Mult Living                 Review of Systems  All other systems reviewed and are negative.    Allergies  Tramadol  Home Medications   Current Outpatient Rx  Name  Route  Sig  Dispense  Refill  . HYDROcodone-acetaminophen (NORCO/VICODIN) 5-325 MG per tablet   Oral   Take 1 tablet by mouth every 4 (four) hours as needed for moderate pain.   15 tablet   0   . insulin NPH-regular (NOVOLIN 70/30) (70-30) 100 UNIT/ML injection   Subcutaneous   Inject 15-20 Units into the skin 2 (two) times daily with a meal. 20 units in the a.m. And 15 units in the p.m.         Marland Kitchen meloxicam (MOBIC) 7.5 MG tablet   Oral   Take 2 tablets (15 mg total) by mouth daily.   30 tablet   0   . ondansetron (ZOFRAN) 8 MG tablet   Oral   Take 1 tablet (8 mg total) by mouth every 8 (eight) hours as needed for nausea or vomiting.   20 tablet   0   . penicillin v potassium (VEETID) 500 MG tablet   Oral   Take 1 tablet (500 mg total) by mouth 3 (three) times daily.   30 tablet   0   . pseudoephedrine  (SUDAFED) 60 MG tablet   Oral   Take 0.5 tablets (30 mg total) by mouth every 6 (six) hours as needed for congestion.   20 tablet   0    BP 176/81  Pulse 90  Temp(Src) 98.4 F (36.9 C) (Oral)  Resp 20  Ht 5\' 2"  (1.575 m)  Wt 196 lb 12.8 oz (89.268 kg)  BMI 35.99 kg/m2  SpO2 100% Physical Exam  Nursing note and vitals reviewed. Constitutional: She is oriented to person, place, and time. She appears well-developed and well-nourished.  HENT:  Head: Normocephalic and atraumatic.  Mouth/Throat: Uvula is midline and oropharynx is clear and moist. No trismus in the jaw.  R lower third molar w/ advanced caries and severe ttp w/ guarding. Adjacent gingiva appears normal.  No edema of buccal mucosa.    Eyes:  Normal appearance  Neck: Normal range of motion. Neck supple.  No submandibular edema  Lymphadenopathy:    She has no cervical adenopathy.  Neurological: She is alert and oriented to person, place, and time.  Psychiatric: She has a normal mood  and affect. Her behavior is normal.    ED Course  Dental Date/Time: 02/21/2013 12:46 AM Performed by: Otilio Miu Authorized by: Ruby Cola E Consent: Verbal consent obtained. Risks and benefits: risks, benefits and alternatives were discussed Consent given by: patient Local anesthesia used: yes Anesthesia: local infiltration Local anesthetic: bupivacaine 0.25% with epinephrine Anesthetic total: 1.8 ml Patient sedated: no Patient tolerance: Patient tolerated the procedure well with no immediate complications. Comments: Periapical block right lower third molar   (including critical care time) Labs Review Labs Reviewed - No data to display Imaging Review No results found.  EKG Interpretation   None       MDM   1. Pain, dental    62yo F presents w/ dental pain.  Likely periapical abscess of right lower third molar.  Had relief w/ periapical block.  D/c'd home w/ 15 vicodin as well as penicillin and  referral to dentist on call.   12:47 AM     Otilio Miu, PA-C 02/21/13 [phone]

## 2013-02-22 NOTE — ED Provider Notes (Signed)
Medical screening examination/treatment/procedure(s) were performed by non-physician practitioner and as supervising physician I was immediately available for consultation/collaboration.   Kaida Games, MD 02/22/13 0551 

## 2013-04-16 ENCOUNTER — Emergency Department (HOSPITAL_COMMUNITY)
Admission: EM | Admit: 2013-04-16 | Discharge: 2013-04-16 | Disposition: A | Discharge status: Home / Self Care | Payer: PRIVATE HEALTH INSURANCE | Attending: Emergency Medicine | Admitting: Emergency Medicine

## 2013-04-16 ENCOUNTER — Encounter (HOSPITAL_COMMUNITY): Payer: Self-pay | Admitting: Emergency Medicine

## 2013-04-16 DIAGNOSIS — Z9119 Patient's noncompliance with other medical treatment and regimen: Secondary | ICD-10-CM | POA: Insufficient documentation

## 2013-04-16 DIAGNOSIS — M1990 Unspecified osteoarthritis, unspecified site: Secondary | ICD-10-CM

## 2013-04-16 DIAGNOSIS — Z791 Long term (current) use of non-steroidal anti-inflammatories (NSAID): Secondary | ICD-10-CM | POA: Insufficient documentation

## 2013-04-16 DIAGNOSIS — M545 Low back pain, unspecified: Secondary | ICD-10-CM | POA: Insufficient documentation

## 2013-04-16 DIAGNOSIS — I10 Essential (primary) hypertension: Secondary | ICD-10-CM | POA: Insufficient documentation

## 2013-04-16 DIAGNOSIS — B373 Candidiasis of vulva and vagina: Secondary | ICD-10-CM | POA: Insufficient documentation

## 2013-04-16 DIAGNOSIS — Z79899 Other long term (current) drug therapy: Secondary | ICD-10-CM | POA: Insufficient documentation

## 2013-04-16 DIAGNOSIS — R739 Hyperglycemia, unspecified: Secondary | ICD-10-CM

## 2013-04-16 DIAGNOSIS — Z91199 Patient's noncompliance with other medical treatment and regimen due to unspecified reason: Secondary | ICD-10-CM | POA: Insufficient documentation

## 2013-04-16 DIAGNOSIS — M129 Arthropathy, unspecified: Secondary | ICD-10-CM | POA: Insufficient documentation

## 2013-04-16 DIAGNOSIS — M25519 Pain in unspecified shoulder: Secondary | ICD-10-CM | POA: Insufficient documentation

## 2013-04-16 DIAGNOSIS — Z9889 Other specified postprocedural states: Secondary | ICD-10-CM | POA: Insufficient documentation

## 2013-04-16 DIAGNOSIS — B3731 Acute candidiasis of vulva and vagina: Secondary | ICD-10-CM | POA: Insufficient documentation

## 2013-04-16 DIAGNOSIS — G8929 Other chronic pain: Secondary | ICD-10-CM | POA: Insufficient documentation

## 2013-04-16 DIAGNOSIS — E119 Type 2 diabetes mellitus without complications: Secondary | ICD-10-CM | POA: Insufficient documentation

## 2013-04-16 DIAGNOSIS — Z794 Long term (current) use of insulin: Secondary | ICD-10-CM | POA: Insufficient documentation

## 2013-04-16 LAB — WET PREP, GENITAL
Clue Cells Wet Prep HPF POC: NONE SEEN
Trich, Wet Prep: NONE SEEN

## 2013-04-16 LAB — BASIC METABOLIC PANEL
BUN: 21 mg/dL (ref 6–23)
CO2: 26 meq/L (ref 19–32)
CREATININE: 1.07 mg/dL (ref 0.50–1.10)
Calcium: 9.4 mg/dL (ref 8.4–10.5)
Chloride: 94 mEq/L — ABNORMAL LOW (ref 96–112)
GFR calc Af Amer: 64 mL/min — ABNORMAL LOW (ref >90)
GFR, EST NON AFRICAN AMERICAN: 55 mL/min — AB (ref >90)
Glucose, Bld: 547 mg/dL — ABNORMAL HIGH (ref 70–99)
Potassium: 4.6 mEq/L (ref 3.7–5.3)
Sodium: 133 mEq/L — ABNORMAL LOW (ref 137–147)

## 2013-04-16 LAB — URINE MICROSCOPIC-ADD ON

## 2013-04-16 LAB — GLUCOSE, CAPILLARY
GLUCOSE-CAPILLARY: 518 mg/dL — AB (ref 70–99)
GLUCOSE-CAPILLARY: 165 mg/dL — AB (ref 70–99)

## 2013-04-16 LAB — URINALYSIS, ROUTINE W REFLEX MICROSCOPIC
Bilirubin Urine: NEGATIVE
Glucose, UA: >1000 mg/dL — AB
KETONES UR: NEGATIVE mg/dL
Nitrite: NEGATIVE
PROTEIN: NEGATIVE mg/dL
Specific Gravity, Urine: 1.026 (ref 1.005–1.030)
Urobilinogen, UA: 0.2 mg/dL (ref 0.0–1.0)
pH: 6.5 (ref 5.0–8.0)

## 2013-04-16 MED ORDER — SODIUM CHLORIDE 0.9 % IV BOLUS (SEPSIS)
1000.0000 mL | Freq: Once | INTRAVENOUS | Status: AC
  Administered 2013-04-16: 1000 mL via INTRAVENOUS

## 2013-04-16 MED ORDER — INSULIN ASPART 100 UNIT/ML ~~LOC~~ SOLN
10.0000 [IU] | Freq: Once | SUBCUTANEOUS | Status: AC
  Administered 2013-04-16: 10 [IU] via INTRAVENOUS
  Filled 2013-04-16: qty 1

## 2013-04-16 MED ORDER — FREESTYLE SYSTEM KIT: 1.0000 | PACK | Status: DC | PRN

## 2013-04-16 MED ORDER — NAPROXEN 250 MG PO TABS
500.0000 mg | ORAL_TABLET | Freq: Two times a day (BID) | ORAL | Status: DC
  Administered 2013-04-16: 500 mg via ORAL
  Filled 2013-04-16: qty 2

## 2013-04-16 MED ORDER — MICONAZOLE NITRATE 2 % EX CREA: 1.0000 "application " | TOPICAL_CREAM | Freq: Two times a day (BID) | CUTANEOUS | Status: DC

## 2013-04-16 MED ORDER — CLOTRIMAZOLE 1 % VA CREA
1.0000 | TOPICAL_CREAM | Freq: Once | VAGINAL | Status: DC
  Filled 2013-04-16: qty 45

## 2013-04-16 MED ORDER — FLUCONAZOLE 100 MG PO TABS
150.0000 mg | ORAL_TABLET | Freq: Once | ORAL | Status: AC
  Administered 2013-04-16: 150 mg via ORAL
  Filled 2013-04-16: qty 2

## 2013-04-16 MED ORDER — GLUCOSE BLOOD VI STRP: ORAL_STRIP | Status: DC

## 2013-04-16 NOTE — Discharge Instructions (Signed)
Important for you to followup with a primary care Dr. to have your blood sugar and blood pressure monitored appropriately.  Your symptoms of vaginal yeast infection or do to your elevated blood sugar.  You should be checking your blood sugar several times a day.  Use the resources list below, or followup with the outpatient clinic to get back into the habit of seeing a doctor regularly.   Arthritis, Nonspecific Arthritis is inflammation of a joint. This usually means pain, redness, warmth or swelling are present. One or more joints may be involved. There are a number of types of arthritis. Your caregiver may not be able to tell what type of arthritis you have right away. CAUSES  The most common cause of arthritis is the wear and tear on the joint (osteoarthritis). This causes damage to the cartilage, which can break down over time. The knees, hips, back and neck are most often affected by this type of arthritis. Other types of arthritis and common causes of joint pain include:  Sprains and other injuries near the joint. Sometimes minor sprains and injuries cause pain and swelling that develop hours later.  Rheumatoid arthritis. This affects hands, feet and knees. It usually affects both sides of your body at the same time. It is often associated with chronic ailments, fever, weight loss and general weakness.  Crystal arthritis. Gout and pseudo gout can cause occasional acute severe pain, redness and swelling in the foot, ankle, or knee.  Infectious arthritis. Bacteria can get into a joint through a break in overlying skin. This can cause infection of the joint. Bacteria and viruses can also spread through the blood and affect your joints.  Drug, infectious and allergy reactions. Sometimes joints can become mildly painful and slightly swollen with these types of illnesses. SYMPTOMS   Pain is the main symptom.  Your joint or joints can also be red, swollen and warm or hot to the touch.  You may  have a fever with certain types of arthritis, or even feel overall ill.  The joint with arthritis will hurt with movement. Stiffness is present with some types of arthritis. DIAGNOSIS  Your caregiver will suspect arthritis based on your description of your symptoms and on your exam. Testing may be needed to find the type of arthritis:  Blood and sometimes urine tests.  X-ray tests and sometimes CT or MRI scans.  Removal of fluid from the joint (arthrocentesis) is done to check for bacteria, crystals or other causes. Your caregiver (or a specialist) will numb the area over the joint with a local anesthetic, and use a needle to remove joint fluid for examination. This procedure is only minimally uncomfortable.  Even with these tests, your caregiver may not be able to tell what kind of arthritis you have. Consultation with a specialist (rheumatologist) may be helpful. TREATMENT  Your caregiver will discuss with you treatment specific to your type of arthritis. If the specific type cannot be determined, then the following general recommendations may apply. Treatment of severe joint pain includes:  Rest.  Elevation.  Anti-inflammatory medication (for example, ibuprofen) may be prescribed. Avoiding activities that cause increased pain.  Only take over-the-counter or prescription medicines for pain and discomfort as recommended by your caregiver.  Cold packs over an inflamed joint may be used for 10 to 15 minutes every hour. Hot packs sometimes feel better, but do not use overnight. Do not use hot packs if you are diabetic without your caregiver's permission.  A cortisone shot  into arthritic joints may help reduce pain and swelling.  Any acute arthritis that gets worse over the next 1 to 2 days needs to be looked at to be sure there is no joint infection. Long-term arthritis treatment involves modifying activities and lifestyle to reduce joint stress jarring. This can include weight loss.  Also, exercise is needed to nourish the joint cartilage and remove waste. This helps keep the muscles around the joint strong. HOME CARE INSTRUCTIONS   Do not take aspirin to relieve pain if gout is suspected. This elevates uric acid levels.  Only take over-the-counter or prescription medicines for pain, discomfort or fever as directed by your caregiver.  Rest the joint as much as possible.  If your joint is swollen, keep it elevated.  Use crutches if the painful joint is in your leg.  Drinking plenty of fluids may help for certain types of arthritis.  Follow your caregiver's dietary instructions.  Try low-impact exercise such as:  Swimming.  Water aerobics.  Biking.  Walking.  Morning stiffness is often relieved by a warm shower.  Put your joints through regular range-of-motion. SEEK MEDICAL CARE IF:   You do not feel better in 24 hours or are getting worse.  You have side effects to medications, or are not getting better with treatment. SEEK IMMEDIATE MEDICAL CARE IF:   You have a fever.  You develop severe joint pain, swelling or redness.  Many joints are involved and become painful and swollen.  There is severe back pain and/or leg weakness.  You have loss of bowel or bladder control. Document Released: 04/02/2004 Document Revised: 05/18/2011 Document Reviewed: 04/18/2008 [facility] Patient Information 2014 [place], [place].  Candidal Vulvovaginitis Candidal vulvovaginitis is an infection of the vagina and vulva. The vulva is the skin around the opening of the vagina. This may cause itching and discomfort in and around the vagina.  HOME CARE  Only take medicine as told by your doctor.  Do not have sex (intercourse) until the infection is healed or as told by your doctor.  Practice safe sex.  Tell your sex partner about your infection.  Do not douche or use tampons.  Wear cotton underwear. Do not wear tight pants or panty hose.  Eat yogurt. This may  help treat and prevent yeast infections. GET HELP RIGHT AWAY IF:   You have a fever.  Your problems get worse during treatment or do not get better in 3 days.  You have discomfort, irritation, or itching in your vagina or vulva area.  You have pain after sex.  You start to get belly (abdominal) pain. MAKE SURE YOU:  Understand these instructions.  Will watch your condition.  Will get help right away if you are not doing well or get worse. Document Released: 05/22/2008 Document Revised: 05/18/2011 Document Reviewed: 05/22/2008 [facility] Patient Information 2014 [place], [place].  High Blood Sugar High blood sugar (hyperglycemia) means that the level of sugar in your blood is higher than it should be. Signs of high blood sugar include:  Feeling thirsty.  Frequent peeing (urinating).  Feeling tired or sleepy.  Dry mouth.  Vision changes.  Feeling weak.  Feeling hungry but losing weight.  Numbness and tingling in your hands or feet.  Headache. When you ignore these signs, your blood sugar may keep going up. These problems may get worse, and other problems may begin. HOME CARE  Check your blood sugars as told by your doctor. Write down the numbers with the date and time.  Take the right amount of insulin or diabetes pills at the right time. Write down the dose with date and time.  Refill your insulin or diabetes pills before running out.  Watch what you eat. Follow your meal plan.  Drink liquids without sugar, such as water. Check with your doctor if you have kidney or heart disease.  Follow your doctor's orders for exercise. Exercise at the same time of day.  Keep your doctor's appointments. GET HELP RIGHT AWAY IF:   You have trouble thinking or are confused.  You have fast breathing with fruity smelling breath.  You pass out (faint).  You have 2 to 3 days of high blood sugars and you do not know why.  You have chest pain.  You are feeling sick to your  stomach (nauseous) or throwing up (vomiting).  You have sudden vision changes. MAKE SURE YOU:   Understand these instructions.  Will watch your condition.  Will get help right away if you are not doing well or get worse. Document Released: 12/21/2008 Document Revised: 05/18/2011 Document Reviewed: 12/21/2008 [facility] Patient Information 2014 [place], [place].  Hypertension Hypertension is another name for high blood pressure. High blood pressure may mean that your heart needs to work harder to pump blood. Blood pressure consists of two numbers, which includes a higher number over a lower number (example: 110/72). HOME CARE   Make lifestyle changes as told by your doctor. This may include weight loss and exercise.  Take your blood pressure medicine every day.  Limit how much salt you use.  Stop smoking if you smoke.  Do not use drugs.  Talk to your doctor if you are using decongestants or birth control pills. These medicines might make blood pressure higher.  Females should not drink more than 1 alcoholic drink per day. Males should not drink more than 2 alcoholic drinks per day.  See your doctor as told. GET HELP RIGHT AWAY IF:   You have a blood pressure reading with a top number of 180 or higher.  You get a very bad headache.  You get blurred or changing vision.  You feel confused.  You feel weak, numb, or faint.  You get chest or belly (abdominal) pain.  You throw up (vomit).  You cannot breathe very well. MAKE SURE YOU:   Understand these instructions.  Will watch your condition.  Will get help right away if you are not doing well or get worse. Document Released: 08/12/2007 Document Revised: 05/18/2011 Document Reviewed: 08/12/2007 [facility] Patient Information 2014 [place], [place].

## 2013-04-16 NOTE — ED Provider Notes (Signed)
CSN: [id number]     Arrival date & time 04/16/13  0052 History   First MD Initiated Contact with Patient 04/16/13 [phone]     Chief Complaint  Patient presents with  . Urinary Tract Infection  . Vaginal Discharge   (Consider location/radiation/quality/duration/timing/severity/associated sxs/prior Treatment) HPI 63 year old female presents to emergency department with complaint of one week of pelvic itching, and a white yellow discharge.  She attempted to use Monistat 3 day treatment without improvement in symptoms.  She has some burning externally with urination.  She denies any new sex partners.  She is postmenopausal.  Patient also complains of diffuse arthralgias.  She reports right shoulder pain ongoing for 3 years, left lower back pain with radiation to her leg ongoing for 6-7 years.  She reports over the last week, however, she has had worsening pain into her left leg.  She also reports some diffuse transitory abdominal pains over the last week.  No fevers or chills.  No nausea or vomiting.  No change in appetite.  No weight loss or gain.  No bowel or bladder incontinence.  Patient was followed at outpatient clinics, but lost her insurance, and has not been back here in several years. Patient noted be hypertensive, denies prior history of hypertension, is not on any medications.  She does not have a primary care doctor currently. Past Medical History  Diagnosis Date  . Diabetes mellitus    Past Surgical History  Procedure Laterality Date  . Arm surgery     . Cesarean section     Family History  Problem Relation Age of Onset  . Diabetes Other    History  Substance Use Topics  . Smoking status: Never Smoker   . Smokeless tobacco: Not on file  . Alcohol Use: No   OB History   Grav Para Term Preterm Abortions TAB SAB Ect Mult Living                 Review of Systems  See History of Present Illness; otherwise all other systems are reviewed and negative Allergies  Tramadol  Home  Medications   Current Outpatient Rx  Name  Route  Sig  Dispense  Refill  . HYDROcodone-acetaminophen (NORCO/VICODIN) 5-325 MG per tablet   Oral   Take 1 tablet by mouth every 4 (four) hours as needed for moderate pain.   15 tablet   0   . insulin NPH-regular (NOVOLIN 70/30) (70-30) 100 UNIT/ML injection   Subcutaneous   Inject 15-20 Units into the skin 2 (two) times daily with a meal. 20 units in the a.m. And 15 units in the p.m.         Marland Kitchen meloxicam (MOBIC) 7.5 MG tablet   Oral   Take 2 tablets (15 mg total) by mouth daily.   30 tablet   0   . ondansetron (ZOFRAN) 8 MG tablet   Oral   Take 1 tablet (8 mg total) by mouth every 8 (eight) hours as needed for nausea or vomiting.   20 tablet   0   . penicillin v potassium (VEETID) 500 MG tablet   Oral   Take 1 tablet (500 mg total) by mouth 3 (three) times daily.   30 tablet   0   . pseudoephedrine (SUDAFED) 60 MG tablet   Oral   Take 0.5 tablets (30 mg total) by mouth every 6 (six) hours as needed for congestion.   20 tablet   0    BP 179/85  Pulse 88  Temp(Src) 97.7 F (36.5 C) (Oral)  Resp 18  Ht 5\' 2"  (1.575 m)  Wt 196 lb (88.905 kg)  BMI 35.84 kg/m2  SpO2 97% Physical Exam  Nursing note and vitals reviewed. Constitutional: She is oriented to person, place, and time. She appears well-developed and well-nourished. No distress.  HENT:  Head: Normocephalic and atraumatic.  Nose: Nose normal.  Mouth/Throat: Oropharynx is clear and moist.  Eyes: Conjunctivae and EOM are normal. Pupils are equal, round, and reactive to light.  Neck: Normal range of motion. Neck supple. No JVD present. No tracheal deviation present. No thyromegaly present.  Cardiovascular: Normal rate, regular rhythm, normal heart sounds and intact distal pulses.  Exam reveals no gallop and no friction rub.   No murmur heard. Pulmonary/Chest: Effort normal and breath sounds normal. No stridor. No respiratory distress. She has no wheezes. She has  no rales. She exhibits no tenderness.  Abdominal: Soft. Bowel sounds are normal. She exhibits no distension and no mass. There is no tenderness. There is no rebound and no guarding.  Musculoskeletal: Normal range of motion. She exhibits tenderness (diffuse tenderness with palpation of right shoulder, left lower back, mainly in the SI joint.  She has normal range of motion, sensation, reflexes.  No deformities noted on exam.  No step-off or crepitus). She exhibits no edema.  Lymphadenopathy:    She has no cervical adenopathy.  Neurological: She is alert and oriented to person, place, and time. She has normal reflexes. No cranial nerve deficit. She exhibits normal muscle tone. Coordination normal.  Skin: Skin is warm and dry. No rash noted. No erythema. No pallor.  Psychiatric: She has a normal mood and affect. Her behavior is normal. Judgment and thought content normal.    ED Course  Procedures (including critical care time) Labs Review Labs Reviewed  WET PREP, GENITAL - Abnormal; Notable for the following:    Yeast Wet Prep HPF POC RARE (*)    WBC, Wet Prep HPF POC MANY (*)    All other components within normal limits  URINALYSIS, ROUTINE W REFLEX MICROSCOPIC - Abnormal; Notable for the following:    Color, Urine STRAW (*)    APPearance CLOUDY (*)    Glucose, UA >1000 (*)    Hgb urine dipstick SMALL (*)    Leukocytes, UA MODERATE (*)    All other components within normal limits  URINE MICROSCOPIC-ADD ON - Abnormal; Notable for the following:    Squamous Epithelial / LPF FEW (*)    Bacteria, UA FEW (*)    All other components within normal limits  GLUCOSE, CAPILLARY - Abnormal; Notable for the following:    Glucose-Capillary 518 (*)    All other components within normal limits  BASIC METABOLIC PANEL - Abnormal; Notable for the following:    Sodium 133 (*)    Chloride 94 (*)    Glucose, Bld 547 (*)    GFR calc non Af Amer 55 (*)    GFR calc Af Amer 64 (*)    All other components  within normal limits  GLUCOSE, CAPILLARY - Abnormal; Notable for the following:    Glucose-Capillary 165 (*)    All other components within normal limits  URINE CULTURE   Imaging Review No results found.  EKG Interpretation   None       MDM   1. Vulvovaginal candidiasis   2. Hyperglycemia   3. Hypertension   4. Poor compliance   5. Arthritis    63 year old  female with vaginal complaints.  Given history of diabetes, we'll plan for CABG, check.  We'll do pelvic exam.  Her arthralgias sound very chronic in nature.  Possible osteoarthritis.  Patient needs close followup with a primary Dr. for her hypertension noted today.    Olivia Mackielga M Rhiley Solem, MD 04/16/13 [phone]

## 2013-04-16 NOTE — ED Notes (Signed)
Thinks she may have a UTI. "feels irritated down there."  Nguyet Mercer discharge.

## 2013-04-17 LAB — URINE CULTURE

## 2013-09-24 ENCOUNTER — Emergency Department (HOSPITAL_COMMUNITY)
Admission: EM | Admit: 2013-09-24 | Discharge: 2013-09-24 | Disposition: A | Discharge status: Home / Self Care | Payer: PRIVATE HEALTH INSURANCE | Attending: Emergency Medicine | Admitting: Emergency Medicine

## 2013-09-24 ENCOUNTER — Encounter (HOSPITAL_COMMUNITY): Payer: Self-pay | Admitting: Emergency Medicine

## 2013-09-24 DIAGNOSIS — E119 Type 2 diabetes mellitus without complications: Secondary | ICD-10-CM | POA: Insufficient documentation

## 2013-09-24 DIAGNOSIS — Z79899 Other long term (current) drug therapy: Secondary | ICD-10-CM | POA: Insufficient documentation

## 2013-09-24 DIAGNOSIS — Z794 Long term (current) use of insulin: Secondary | ICD-10-CM | POA: Insufficient documentation

## 2013-09-24 DIAGNOSIS — N390 Urinary tract infection, site not specified: Secondary | ICD-10-CM | POA: Insufficient documentation

## 2013-09-24 DIAGNOSIS — R739 Hyperglycemia, unspecified: Secondary | ICD-10-CM

## 2013-09-24 DIAGNOSIS — N949 Unspecified condition associated with female genital organs and menstrual cycle: Secondary | ICD-10-CM | POA: Insufficient documentation

## 2013-09-24 DIAGNOSIS — M791 Myalgia, unspecified site: Secondary | ICD-10-CM

## 2013-09-24 DIAGNOSIS — IMO0001 Reserved for inherently not codable concepts without codable children: Secondary | ICD-10-CM | POA: Insufficient documentation

## 2013-09-24 DIAGNOSIS — B356 Tinea cruris: Secondary | ICD-10-CM

## 2013-09-24 LAB — URINE MICROSCOPIC-ADD ON

## 2013-09-24 LAB — WET PREP, GENITAL
CLUE CELLS WET PREP: NONE SEEN
TRICH WET PREP: NONE SEEN
Yeast Wet Prep HPF POC: NONE SEEN

## 2013-09-24 LAB — CBC
HCT: 35.5 % — ABNORMAL LOW (ref 36.0–46.0)
Hemoglobin: 11.6 g/dL — ABNORMAL LOW (ref 12.0–15.0)
MCH: 26.7 pg (ref 26.0–34.0)
MCHC: 32.7 g/dL (ref 30.0–36.0)
MCV: 81.6 fL (ref 78.0–100.0)
Platelets: 224 10*3/uL (ref 150–400)
RBC: 4.35 MIL/uL (ref 3.87–5.11)
RDW: 12.8 % (ref 11.5–15.5)
WBC: 6.4 10*3/uL (ref 4.0–10.5)

## 2013-09-24 LAB — URINALYSIS, ROUTINE W REFLEX MICROSCOPIC
BILIRUBIN URINE: NEGATIVE
KETONES UR: NEGATIVE mg/dL
Nitrite: NEGATIVE
PH: 5 (ref 5.0–8.0)
Protein, ur: NEGATIVE mg/dL
Specific Gravity, Urine: 1.027 (ref 1.005–1.030)
Urobilinogen, UA: 0.2 mg/dL (ref 0.0–1.0)

## 2013-09-24 LAB — BASIC METABOLIC PANEL
Anion gap: 16 — ABNORMAL HIGH (ref 5–15)
BUN: 27 mg/dL — ABNORMAL HIGH (ref 6–23)
CALCIUM: 9.6 mg/dL (ref 8.4–10.5)
CO2: 23 meq/L (ref 19–32)
Chloride: 97 mEq/L (ref 96–112)
Creatinine, Ser: 1.18 mg/dL — ABNORMAL HIGH (ref 0.50–1.10)
GFR calc Af Amer: 56 mL/min — ABNORMAL LOW (ref >90)
GFR, EST NON AFRICAN AMERICAN: 49 mL/min — AB (ref >90)
GLUCOSE: 416 mg/dL — AB (ref 70–99)
Potassium: 4.2 mEq/L (ref 3.7–5.3)
Sodium: 136 mEq/L — ABNORMAL LOW (ref 137–147)

## 2013-09-24 LAB — CBG MONITORING, ED
GLUCOSE-CAPILLARY: 149 mg/dL — AB (ref 70–99)
Glucose-Capillary: 435 mg/dL — ABNORMAL HIGH (ref 70–99)

## 2013-09-24 MED ORDER — KETOROLAC TROMETHAMINE 15 MG/ML IJ SOLN
15.0000 mg | Freq: Once | INTRAMUSCULAR | Status: AC
  Administered 2013-09-24: 15 mg via INTRAVENOUS

## 2013-09-24 MED ORDER — HYDROXYZINE HCL 25 MG PO TABS
25.0000 mg | ORAL_TABLET | Freq: Once | ORAL | Status: AC
  Administered 2013-09-24: 25 mg via ORAL
  Filled 2013-09-24: qty 1

## 2013-09-24 MED ORDER — OXYCODONE-ACETAMINOPHEN 5-325 MG PO TABS
1.0000 | ORAL_TABLET | Freq: Once | ORAL | Status: AC
  Administered 2013-09-24: 1 via ORAL
  Filled 2013-09-24: qty 1

## 2013-09-24 MED ORDER — CEPHALEXIN 500 MG PO CAPS: 500.0000 mg | ORAL_CAPSULE | Freq: Three times a day (TID) | ORAL | Status: DC

## 2013-09-24 MED ORDER — NYSTATIN 100000 UNIT/GM EX CREA
TOPICAL_CREAM | Freq: Once | CUTANEOUS | Status: AC
  Administered 2013-09-24: 20:00:00 via TOPICAL
  Filled 2013-09-24: qty 15

## 2013-09-24 MED ORDER — KETOROLAC TROMETHAMINE 30 MG/ML IJ SOLN
15.0000 mg | Freq: Once | INTRAMUSCULAR | Status: DC
  Filled 2013-09-24: qty 1

## 2013-09-24 MED ORDER — DEXTROSE 5 % IV SOLN
1.0000 g | Freq: Once | INTRAVENOUS | Status: AC
  Administered 2013-09-24: 1 g via INTRAVENOUS
  Filled 2013-09-24: qty 10

## 2013-09-24 MED ORDER — NYSTATIN 100000 UNIT/GM EX CREA: TOPICAL_CREAM | CUTANEOUS | Status: DC

## 2013-09-24 MED ORDER — HYDROXYZINE HCL 25 MG PO TABS: 25.0000 mg | ORAL_TABLET | Freq: Four times a day (QID) | ORAL | Status: DC

## 2013-09-24 MED ORDER — SODIUM CHLORIDE 0.9 % IV BOLUS (SEPSIS)
1000.0000 mL | Freq: Once | INTRAVENOUS | Status: AC
  Administered 2013-09-24: 1000 mL via INTRAVENOUS

## 2013-09-24 NOTE — ED Notes (Signed)
She states "im just hurting all over my body. My vagina, my feet, my head. Its just my whole body." she denies any other complaints

## 2013-09-24 NOTE — Discharge Instructions (Signed)
Call for a follow up appointment with a Family or Primary Care Provider.  Monitor her blood sugars and lower them to decrease the chance of another infection. Return if Symptoms worsen.   Take medication as prescribed.  Drink plenty of fluids. Avoid scratching your rash to prevent another infection. Do not wash with subsequent pain perfume or dye, blocked dry after her washing with a mild soap. Clean and dry.   Emergency Department Resource Guide 1) Find a Doctor and Pay Out of Pocket Although you won't have to find out who is covered by your insurance plan, it is a good idea to ask around and get recommendations. You will then need to call the office and see if the doctor you have chosen will accept you as a new patient and what types of options they offer for patients who are self-pay. Some doctors offer discounts or will set up payment plans for their patients who do not have insurance, but you will need to ask so you aren't surprised when you get to your appointment.  2) Contact Your Local Health Department Not all health departments have doctors that can see patients for sick visits, but many do, so it is worth a call to see if yours does. If you don't know where your local health department is, you can check in your phone book. The CDC also has a tool to help you locate your state's health department, and many state websites also have listings of all of their local health departments.  3) Find a Walk-in Clinic If your illness is not likely to be very severe or complicated, you may want to try a walk in clinic. These are popping up all over the country in pharmacies, drugstores, and shopping centers. They're usually staffed by nurse practitioners or physician assistants that have been trained to treat common illnesses and complaints. They're usually fairly quick and inexpensive. However, if you have serious medical issues or chronic medical problems, these are probably not your best  option.  No Primary Care Doctor: - Call Health Connect at  [phone] - they can help you locate a primary care doctor that  accepts your insurance, provides certain services, etc. - Physician Referral Service- [phone]  Chronic Pain Problems: Organization         Address  Phone   Notes  [facility]  [phone] Patients need to be referred by their primary care doctor.   Medication Assistance: Organization         Address  Phone   Notes  [facility] [address] [place], [place] [address] [phone] --Must be a resident of [place] -- Must have NO insurance coverage whatsoever (no Medicaid/ Medicare, etc.) -- The pt. MUST have a primary care doctor that directs their care regularly and follows them in the community   MedAssist  [phone]   [employer]  [phone]    Agencies that provide inexpensive medical care: Organization         Address  Phone   Notes  [facility]  [phone]   [facility]    [phone]   [facility] [address] [place], [place] [address] [phone]   [facility] [address], [place] [phone]   [facility]anned [facility]    [phone]   [facility]    [phone]   Community Health  and [address], [place] Phone:  [phone], Fax:  [phone] Hours of Operation:  9 am - 6 pm, M-F.  Also accepts Medicaid/Medicare and self-pay.  [facility] for [address] [place], [address], [place] Phone: [phone], Fax: [phone]. Hours of Operation:  8:30 am - 5:30 pm, M-F.  Also accepts Medicaid and self-pay.  [facility] [place] [address], [place] Phone: [phone]   [place], [place], [place] [phone], Ext. [phone] Mondays & Thursdays: 7-9 AM.  First 15  patients are seen on a first come, first serve basis.    [place] Providers:  Organization         Address  Phone   Notes  [facility] [address], [place] [phone] Also accepts self-pay patients.  [facility] [id number] [place], [place]  [phone]   [address], [place] [phone]   [facility] [address] [place] [phone]   Lucianne Lei [address], [place]   [phone] Only accepts [name] Access [place] patients after they have their name applied to their card.   Self-Pay (no insurance) in [facility]:  Organization         Address  Phone   Notes  Sickle Cell Patients, [facility] [place] [phone]   [facility] [place] [phone]   [facility] [place]  [place], [place], [place] [phone]   [facility]/Dr. Osei-Bonsu  [address] [place] or [address], [place] [phone] Phone number for both [place] and [place] locations is the same.  [facility] [address] [place] [phone]   [facility] [address] [place] or [address] [phone]   [facility] [address] [place] [phone], phone; [phone], fax Sees patients 1st and 3rd Saturday of every month.  Must not qualify for public or private insurance (i.e. Medicaid, Medicare, [place] Health Choice, Veterans' Benefits)  Household income should be no more than 200% of the poverty level The clinic cannot treat you if you are pregnant or think you are pregnant  Sexually transmitted diseases are not treated at the clinic.    Dental  Care: Organization         Address  Phone  Notes  [facility] [place] [phone] Accepts children up to age 36 who are enrolled in [place] or [place]; pregnant women with a Medicaid card; and children who have applied for Medicaid or [place] Health Choice, but were declined, whose parents can pay a reduced fee at time of service.  [facility] of [facility]  [address], [place] [phone] Accepts children up to age 37 who are enrolled in [place] or [place]; pregnant women with a Medicaid card; and children who have applied for Medicaid or [place] Health Choice, but were declined, whose parents can pay a reduced fee at time of service.  [facility]  [address]  Mardene Speak [phone] Patients are seen by appointment only. Walk-ins are not accepted. [place] will see patients 41 years of age and older. Monday - Tuesday (8am-5pm) Most Wednesdays (8:30-5pm) $30 per visit, cash only  [facility] Access PROGRAM  [address], [facility] [phone] Patients are seen by appointment only. Walk-ins are not accepted. [place] will see patients 39 years of age and older. One Wednesday Evening (Monthly: Volunteer Based).  $30 per visit, cash only  [place]  [phone] for adults; Children under age 55, call Graduate Pediatric Dentistry at [phone]. Children aged 51-14, please call [phone] to request a pediatric application.  Dental services are provided in all areas of dental care including fillings, crowns and bridges, complete and partial dentures, implants, gum treatment, root canals, and extractions. Preventive care is also provided. Treatment is provided to both adults and children. Patients are selected via a lottery and there is often a waiting list.   [facility] [address], [place]  [phone] [email or web address]   [facility] [address] [place], [place] [phone], Ext. [phone] Second and Fourth Thursday of each month, opens at 6:30 AM; Clinic ends at 9 AM.  Patients are seen on a first-come first-served basis, and a limited number are seen during each clinic.   [facility]  [address]ard Danker [place], [place] [phone]   Eligibility Requirements You must have lived in [place], [place], or [place] counties for at least the last three months.   You cannot be eligible for state or federal sponsored [employer], including [employer], [place], or [employer].   You generally cannot be eligible for healthcare insurance through your employer.    How to apply: Eligibility screenings are held every Tuesday and Wednesday afternoon from 1:00 pm until 4:00 pm. You do not need an appointment for the interview!  [facility] [address], [place], [place]   [place]  [place] Department  [place]  [phone]    Behavioral Health Resources in the Community: Intensive Outpatient Programs Organization         Address  Phone  Notes  [place]. [address], [place], [place] [phone]   [facility] Outpatient [address] [place], [place]   ADS: Alcohol [facility] Drug S[facility]cs [address], [place], [place]   [place] [address]  [place], [place] or [phone]   Substance Abuse Resources Organization         Address  Phone  Notes  Alcohol and Drug Services  [phone]   [place]  [phone]   [facility] [place]   Chinita Pester  [phone]   Residential & Outpatient Substance Abuse Program  [phone]    Psychological Services Organization         Address  Phone  Notes  [facility]  [place]  [phone]   [place] [address] [place] or [phone]    Mobile Crisis Teams Organization         Address  Phone  Notes  [facility], Mobile Crisis Care Unit  [phone]   [facility]  [address] [place], [place]   [place]  [address] [name] [phone]    Self-Help/Support Groups Organization         Address  Phone             Notes  [facility] of [place] - variety of support groups  [phone]- I7437963 Call for more information  Narcotics Anonymous (NA), [facility] [address], [employer] [place]  2 meetings at this location   Statistician         Address  Phone  Notes  [facility] [address],    [name]  [phone]   [facility]  [address] [place] [address], [employer], [name] [phone]   [facility] [address] [place], [place] [phone] Admissions: 8am-3pm M-F  [facility] [address],    [place], [name] [phone]   [facility] [address] [place], [place], [name] [phone]   [facility] [address]  [place], [name] [phone]   [facility] - Intensive Outpatient [address], [place], [name] [phone]   [facility] ([facility] [address] [place].,  [place], [name] [phone] or [phone]   [facility] Services (RTS) [address] [place], [name] [phone] Accepts Medicaid  [facility] [place] [address]  [name] [phone] Substance Abuse/Addiction Treatment   [facility] Organization         Address  Phone  Notes  [facility]  [phone]   Angie Fava, PhD [address] [place], [name]   [phone] or 8125908717   [facility] Behavioral   [address] [place], [name] [phone]   [facility] [address] [place], [name] [phone] Insurance/Medicaid/sponsorship through [facility] and Families [address]                                    [place], [name] [phone] Therapy/tele-psych/case  [facility] [address], [name] [phone]    Dr. Lolly Mustache  [phone]   [facility] [place]  [facility] Dept. 1) [address], [place] 2) [address] [place] 3)  [address], [place] [phone]  [phone]   [facility] Child Abuse Hotline 5156234099 or [phone] (After Hours)

## 2013-09-24 NOTE — ED Provider Notes (Signed)
CSN: [id number]     Arrival date & time 09/24/13  1426 History   First MD Initiated Contact with Patient 09/24/13 1517     Chief Complaint  Patient presents with  . Generalized Body Aches     (Consider location/radiation/quality/duration/timing/severity/associated sxs/prior Treatment) HPI Comments: The patient is a 63 year old female past history of diabetes present emergency room with generalized pain for 3 days. She reports full body myalgias since Thursday. She reports pruritic discomfort to bilateral groins and genital area. She reports taking over-the-counter Monistat which worsened the pain. Denies vaginal discharge. Reports symptoms worsen with urination. She denies bowel pain, nausea, vomiting. She reports associated generalized pruritus, partial relief with Benadryl. The patient reports compliance with her Novolin last dose at approximately 0900 today. Reports home glucose in the 200s, sitting at high because she has been on compliant with diet. Reports last sexual intercourse 10 years ago.   The history is provided by the patient. No language interpreter was used.    Past Medical History  Diagnosis Date  . Diabetes mellitus    Past Surgical History  Procedure Laterality Date  . Arm surgery     . Cesarean section     Family History  Problem Relation Age of Onset  . Diabetes Other    History  Substance Use Topics  . Smoking status: Never Smoker   . Smokeless tobacco: Not on file  . Alcohol Use: No   OB History   Grav Para Term Preterm Abortions TAB SAB Ect Mult Living                 Review of Systems  Constitutional: Negative for fever and chills.  Gastrointestinal: Negative for nausea, vomiting and abdominal pain.  Genitourinary: Positive for dysuria and vaginal pain. Negative for urgency, hematuria, vaginal bleeding, vaginal discharge, genital sores and pelvic pain.  Musculoskeletal: Positive for myalgias.      Allergies  Benadryl and Tramadol  Home  Medications   Prior to Admission medications   Medication Sig Start Date End Date Taking? Authorizing Provider  glucose blood test strip Use as instructed 04/16/13   Kalman Drape, MD  glucose monitoring kit (FREESTYLE) monitoring kit 1 each by Does not apply route as needed for other. 04/16/13   Kalman Drape, MD  insulin NPH-regular (NOVOLIN 70/30) (70-30) 100 UNIT/ML injection Inject 15-20 Units into the skin 2 (two) times daily with a meal. 20 units in the a.m. And 15 units in the p.m.    Historical Provider, MD  miconazole (MICOTIN) 2 % cream Apply 1 application topically 2 (two) times daily. 04/16/13   Kalman Drape, MD   BP 169/124  Pulse 94  Temp(Src) 98 F (36.7 C) (Oral)  Resp 18  Ht 5' 2" (1.575 m)  Wt 189 lb (85.73 kg)  BMI 34.56 kg/m2  SpO2 100% Physical Exam  Nursing note and vitals reviewed. Constitutional: She is oriented to person, place, and time. She appears well-developed and well-nourished.  Non-toxic appearance. She does not have a sickly appearance. She does not appear ill. No distress.  HENT:  Head: Normocephalic and atraumatic.  Eyes: EOM are normal. Pupils are equal, round, and reactive to light.  Neck: Normal range of motion. Neck supple.  Cardiovascular: Normal rate and regular rhythm.   Pulmonary/Chest: Effort normal and breath sounds normal. No respiratory distress. She has no wheezes. She has no rales.  Abdominal: Soft. Bowel sounds are normal. She exhibits no distension. There is no tenderness. There is  no rebound and no guarding.  Genitourinary: There is no rash on the right labia. There is no rash on the left labia. Uterus is not tender. Cervix exhibits no motion tenderness. Right adnexum displays no tenderness. Left adnexum displays no tenderness. There is erythema, tenderness and bleeding around the vagina. No foreign body around the vagina. No vaginal discharge found.  Bilateral inguinal folds with erythematous lesions. Erythematous lesion in right sided  vaginal vault. No vesicles, or ulcerations noted.  Musculoskeletal: Normal range of motion.  Neurological: She is alert and oriented to person, place, and time.  Skin: Skin is warm and dry. She is not diaphoretic.  Psychiatric: She has a normal mood and affect.    ED Course  Procedures (including critical care time) Labs Review Results for orders placed during the hospital encounter of 09/24/13  WET PREP, GENITAL      Result Value Ref Range   Yeast Wet Prep HPF POC NONE SEEN  NONE SEEN   Trich, Wet Prep NONE SEEN  NONE SEEN   Clue Cells Wet Prep HPF POC NONE SEEN  NONE SEEN   WBC, Wet Prep HPF POC FEW (*) NONE SEEN  GC/CHLAMYDIA PROBE AMP      Result Value Ref Range   CT Probe RNA NEGATIVE  NEGATIVE   GC Probe RNA NEGATIVE  NEGATIVE  URINE CULTURE      Result Value Ref Range   Specimen Description URINE, CLEAN CATCH     Special Requests NONE     Culture  Setup Time       Value: 09/24/2013 21:55     Performed at [employer] Count       Value: NO GROWTH     Performed at [facility]   Culture       Value: NO GROWTH     Performed at [facility]   Report Status 09/26/2013 FINAL    CBC      Result Value Ref Range   WBC 6.4  4.0 - 10.5 K/uL   RBC 4.35  3.87 - 5.11 MIL/uL   Hemoglobin 11.6 (*) 12.0 - 15.0 g/dL   HCT 35.5 (*) 36.0 - 46.0 %   MCV 81.6  78.0 - 100.0 fL   MCH 26.7  26.0 - 34.0 pg   MCHC 32.7  30.0 - 36.0 g/dL   RDW 12.8  11.5 - 15.5 %   Platelets 224  150 - 400 K/uL  BASIC METABOLIC PANEL      Result Value Ref Range   Sodium 136 (*) 137 - 147 mEq/L   Potassium 4.2  3.7 - 5.3 mEq/L   Chloride 97  96 - 112 mEq/L   CO2 23  19 - 32 mEq/L   Glucose, Bld 416 (*) 70 - 99 mg/dL   BUN 27 (*) 6 - 23 mg/dL   Creatinine, Ser 1.18 (*) 0.50 - 1.10 mg/dL   Calcium 9.6  8.4 - 10.5 mg/dL   GFR calc non Af Amer 49 (*) >90 mL/min   GFR calc Af Amer 56 (*) >90 mL/min   Anion gap 16 (*) 5 - 15  URINALYSIS, ROUTINE W REFLEX MICROSCOPIC       Result Value Ref Range   Color, Urine YELLOW  YELLOW   APPearance CLEAR  CLEAR   Specific Gravity, Urine 1.027  1.005 - 1.030   pH 5.0  5.0 - 8.0   Glucose, UA >1000 (*) NEGATIVE mg/dL   Hgb  urine dipstick MODERATE (*) NEGATIVE   Bilirubin Urine NEGATIVE  NEGATIVE   Ketones, ur NEGATIVE  NEGATIVE mg/dL   Protein, ur NEGATIVE  NEGATIVE mg/dL   Urobilinogen, UA 0.2  0.0 - 1.0 mg/dL   Nitrite NEGATIVE  NEGATIVE   Leukocytes, UA SMALL (*) NEGATIVE  URINE MICROSCOPIC-ADD ON      Result Value Ref Range   Squamous Epithelial / LPF FEW (*) RARE   WBC, UA 3-6  <3 WBC/hpf   RBC / HPF 3-6  <3 RBC/hpf   Bacteria, UA FEW (*) RARE  CBG MONITORING, ED      Result Value Ref Range   Glucose-Capillary 435 (*) 70 - 99 mg/dL  CBG MONITORING, ED      Result Value Ref Range   Glucose-Capillary 149 (*) 70 - 99 mg/dL   Comment 1 Notify RN     Comment 2 Documented in Chart     Imaging Review No results found.   EKG Interpretation None      MDM   Final diagnoses:  UTI (lower urinary tract infection)  Hyperglycemia  Tinea cruris  Myalgia   Re-evaluation, pt resting comfortably in room. Discussed wet prep results and UA results the patient and need to treat for UTI. Bilateral groins likely tinea plan to treat topically. Discussed vaginal lesion and need to follow up with her OB/GYN. 2100 Reevaluation patient resting currently in room with him at bedside. Patient reports the resolution of generalized myalgias with Percocet. Medication ordered. 2140 Pt reports improvement of generalized myalgias, requesting discharge home.  Discussed lab results, imaging results, and treatment plan with the patient. Return precautions given. Reports understanding and no other concerns at this time.  Patient is stable for discharge at this time.  Meds given in ED:  Medications  oxyCODONE-acetaminophen (PERCOCET/ROXICET) 5-325 MG per tablet 1 tablet (1 tablet Oral Given 09/24/13 1453)  sodium chloride 0.9 %  bolus 1,000 mL (0 mLs Intravenous Stopped 09/24/13 1846)  cefTRIAXone (ROCEPHIN) 1 g in dextrose 5 % 50 mL IVPB (0 g Intravenous Stopped 09/24/13 2051)  hydrOXYzine (ATARAX/VISTARIL) tablet 25 mg (25 mg Oral Given 09/24/13 2010)  nystatin cream (MYCOSTATIN) ( Topical Given 09/24/13 2013)  ketorolac (TORADOL) 15 MG/ML injection 15 mg (15 mg Intravenous Given 09/24/13 2050)    Discharge Medication List as of 09/24/2013  9:55 PM    START taking these medications   Details  cephALEXin (KEFLEX) 500 MG capsule Take 1 capsule (500 mg total) by mouth 3 (three) times daily., Starting 09/24/2013, Until Discontinued, Print    hydrOXYzine (ATARAX/VISTARIL) 25 MG tablet Take 1 tablet (25 mg total) by mouth every 6 (six) hours., Starting 09/24/2013, Until Discontinued, Print    nystatin cream (MYCOSTATIN) Apply to affected area 2 times daily, Print            Lorrine Kin, PA-C 09/26/13 [id number]

## 2013-09-24 NOTE — ED Notes (Signed)
teh pt continues to get out of wheelchair and scream that she is in pain.She will not listen to staff request to remain seated in wheelchair while we find a bed for her. Placed at nurses station so that she can be visualized.

## 2013-09-24 NOTE — ED Notes (Signed)
Pt here with multiple complaints. Pt reports generalized itching to entire body since last associated with pain. No rash noted. Pt states pain is also in her chest, reports mild SOB last night but denies at this time. Pt also reports pain to bilateral feet. Pt in NAD. VSS.

## 2013-09-25 LAB — GC/CHLAMYDIA PROBE AMP
CT Probe RNA: NEGATIVE
GC PROBE AMP APTIMA: NEGATIVE

## 2013-09-26 LAB — URINE CULTURE
COLONY COUNT: NO GROWTH
CULTURE: NO GROWTH

## 2013-09-26 NOTE — ED Provider Notes (Signed)
Medical screening examination/treatment/procedure(s) were performed by non-physician practitioner and as supervising physician I was immediately available for consultation/collaboration.   EKG Interpretation None        Layla MawKristen N Hayleen Clinkscales, DO 09/26/13 1614

## 2013-12-10 ENCOUNTER — Encounter (HOSPITAL_COMMUNITY): Payer: Self-pay | Admitting: Emergency Medicine

## 2013-12-10 ENCOUNTER — Emergency Department (HOSPITAL_COMMUNITY)
Admission: EM | Admit: 2013-12-10 | Discharge: 2013-12-10 | Disposition: A | Discharge status: Home / Self Care | Payer: PRIVATE HEALTH INSURANCE | Attending: Emergency Medicine | Admitting: Emergency Medicine

## 2013-12-10 DIAGNOSIS — B351 Tinea unguium: Secondary | ICD-10-CM

## 2013-12-10 DIAGNOSIS — Z794 Long term (current) use of insulin: Secondary | ICD-10-CM | POA: Insufficient documentation

## 2013-12-10 DIAGNOSIS — E119 Type 2 diabetes mellitus without complications: Secondary | ICD-10-CM | POA: Insufficient documentation

## 2013-12-10 LAB — CBG MONITORING, ED: Glucose-Capillary: 297 mg/dL — ABNORMAL HIGH (ref 70–99)

## 2013-12-10 NOTE — Discharge Instructions (Signed)
°Emergency Department Resource Guide °1) Find a Doctor and Pay Out of Pocket °Although you won't have to find out who is covered by your insurance plan, it is a good idea to ask around and get recommendations. You will then need to call the office and see if the doctor you have chosen will accept you as a new patient and what types of options they offer for patients who are self-pay. Some doctors offer discounts or will set up payment plans for their patients who do not have insurance, but you will need to ask so you aren't surprised when you get to your appointment. ° °2) Contact Your Local Health Department °Not all health departments have doctors that can see patients for sick visits, but many do, so it is worth a call to see if yours does. If you don't know where your local health department is, you can check in your phone book. The CDC also has a tool to help you locate your state's health department, and many state websites also have listings of all of their local health departments. ° °3) Find a Walk-in Clinic °If your illness is not likely to be very severe or complicated, you may want to try a walk in clinic. These are popping up all over the country in pharmacies, drugstores, and shopping centers. They're usually staffed by nurse practitioners or physician assistants that have been trained to treat common illnesses and complaints. They're usually fairly quick and inexpensive. However, if you have serious medical issues or chronic medical problems, these are probably not your best option. ° °No Primary Care Doctor: °- Call [facility] at  832-8000 - they can help you locate a primary care doctor that  accepts your insurance, provides certain services, etc. °- Physician Referral Service- 1-800-533-3463 ° °Chronic Pain Problems: °Organization         Address  Phone   Notes  ° [facility]  (336) 297-2271 Patients need to be referred by their primary care doctor.  ° °Medication  Assistance: °Organization         Address  Phone   Notes  °[place] [facility] [address] °[place], [place] [address] (336) 641-8030 --Must be a resident of [place] °-- Must have NO insurance coverage whatsoever (no Medicaid/ Medicare, etc.) °-- The pt. MUST have a primary care doctor that directs their care regularly and follows them in the community °  °[facility]  (866) 331-1348   °[employer]  (888) 892-1162   ° °Agencies that provide inexpensive medical care: °Organization         Address  Phone   Notes  °[facility]  (336) 832-8035   °[facility]    (336) 832-7272   °[facility] [address] °[place], [place] [address] (336) 832-4777   °[facility] [address], °[place] (336) 271-4999   °[facility]    (336) 373-0678   °[facility]    (336) 272-1050   °[facility] ° [address], [place] Phone:  (336) 832-4444, Fax:  (336) 832-4440 Hours of Operation:  9 am - 6 pm, M-F.  Also accepts Medicaid/Medicare and self-pay.  °[facility] ° [address], [place] Phone: (336) 832-3150, Fax: (336) 832-3151. Hours of Operation:  8:30 am - 5:30 pm, M-F.  Also accepts Medicaid and self-pay.  °[facility] [place] [address], [place] Phone: (336) 878-6027   °[facility] [address], [place], [place] (336)723-1848, Ext. 123 Mondays & Thursdays: 7-9 AM.  First 15 patients are seen on a first come, first serve basis. °  ° °Medicaid-accepting [place] Providers: ° °Organization         Address  Phone   Notes  °[facility] [address], [place] (336) 641-2100 Also accepts self-pay patients.  °[facility] [address], [place] ° (336) 856-9996   °[facility] [address], [place]  (336) 288-8857   °[facility] [address] [place] [address], [place] (336) 299-7000   °Veita Bland [address], [place]  ° (336) 373-1557 Only accepts [place] Access Medicaid patients after they have their name applied to their card.  ° °Self-Pay (no insurance) in [place]: ° °Organization         Address  Phone   Notes  °Sickle Cell Patients, [facility] [address], [place] (336) 832-1970   °[facility] [address], [place] (336) 832-4400   °[facility] ° [address], [place] (336) 992-4800   °[facility]/Dr. Osei-Bonsu ° [address] [place] [address], [place] or [address], [place] (336) 841-8500 Phone number for both [place] and [place] locations is the same.  °[facility] [address], [place] (336) 299-0000   °[facility] [address] [place] [address], [place] or [address] (336) 852-7530 °(336) 878-2260   °[facility] [address], [place] (336) 350-1642, phone; (336) 294-5005, fax Sees patients 1st and 3rd Saturday of every month.  Must not qualify for public or private insurance (i.e. Medicaid, Medicare, [place] Health Choice, Veterans' Benefits) • Household income should be no more than 200% of the poverty level •The clinic cannot treat you if you are pregnant or think you are pregnant • Sexually transmitted diseases are not treated at the clinic.  ° ° °Dental Care: °Organization         Address  Phone  Notes  °[place] [facility] [address], [place] (336) 641-6152 Accepts children up to age 21 who are enrolled in Medicaid or [place] Health Choice; pregnant women with a Medicaid card; and children who have applied for Medicaid or [place] Health Choice, but were declined, whose parents can pay a reduced fee at time of service.  °[place]  [facility] [place]  [address], [place] (336) 641-7733 Accepts children up to age 21 who are enrolled in Medicaid or [place] Health Choice; pregnant women with a Medicaid card; and children who have applied for Medicaid or [place] Health Choice, but were declined, whose parents can pay a reduced fee at time of service.  °[facility] ° [address],  (336) 641-4533 Patients are seen by appointment only. Walk-ins are not accepted. [facility] will see patients 18 years of age and older. °Monday - Tuesday (8am-5pm) °Most Wednesdays (8:30-5pm) °$30 per visit, cash only  °[facility] ° [address], [place] (336) 641-4533 Patients are seen by appointment only. Walk-ins are not accepted. [facility] will see patients 18 years of age and older. °One   Wednesday Evening (Monthly: Volunteer Based).  $30 per visit, cash only  °[facility]  (919) 537-3737 for adults; Children under age 4, call Graduate Pediatric Dentistry at (919) [phone]. Children aged 4-14, please call (919) [phone] to request a pediatric application. ° Dental services are provided in all areas of dental care including fillings, crowns and bridges, complete and partial dentures, implants, gum treatment, root canals, and extractions. Preventive care is also provided. Treatment is provided to both adults and children. °Patients are selected via a lottery and there is often a waiting list. °  °[facility] [address], °[place] ° (336) 763-8833 [email or web address] °  °[facility] [address], [place], [place] (336)723-1848, Ext. 123 Second and Fourth Thursday of each month, opens at 6:30 AM; Clinic ends at 9 AM.  Patients are seen on a first-come first-served basis, and a limited number are seen during each clinic.  ° °[facility] ° [address], [place], [place] (336) 723-7904    Eligibility Requirements °You must have lived in [place], [place], or [place] counties for at least the last three months. °  You cannot be eligible for state or federal sponsored healthcare insurance, including Veterans Administration, Medicaid, or Medicare. °  You generally cannot be eligible for healthcare insurance through your employer.  °  How to apply: °Eligibility screenings are held every Tuesday and Wednesday afternoon from 1:00 pm until 4:00 pm. You do not need an appointment for the interview!  °[facility] [address], [place], [place] 336-631-2330   °[facility]  336-342-8273   °[facility]  336-703-3100   °[facility]  336-570-6415   ° °Behavioral Health Resources in the Community: °Intensive Outpatient Programs °Organization         Address  Phone  Notes  °[facility] [address], [place], [place] 336-878-6098   °[facility] [address], [place], [place] 336-832-9800   °[facility] [address], [place], [place] ° 336-882-2125   °[facility] [address],  °[place], [place] 1-800-853-5163 or 336-641-4981   °Substance Abuse Resources °Organization         Address  Phone  Notes  °[facility]  336-882-2125   °[facility]  336-784-9470   °[facility]  336-285-9073   °[facility]  336-845-3988   °Residential & Outpatient Substance Abuse Program  1-800-659-3381   °Psychological Services °Organization         Address  Phone  Notes  °[facility]  336- 832-9600   °[facility]  336- 378-7881   °[facility] [address], [place] 1-800-853-5163 or 336-641-4981   ° °Mobile Crisis Teams °Organization         Address  Phone  Notes  °[facility], Mobile Crisis Care Unit  1-877-626-1772   °Assertive °[facility] ° [address]  [place], [place] 336-834-9664   °Sharon DeEsch [address] °[place] 336-554-5454   ° °Self-Help/Support Groups °Organization         Address  Phone             Notes  °[facility] [place] - variety of support groups  336- 373-1402 Call for more information  °Narcotics Anonymous (NA), [facility] [address], °[place]   2 meetings at this location  ° °  Residential Treatment Programs °Organization         Address  Phone  Notes  °[facility] [address],    °[place]  1-866-801-8205   °[facility] ° [address], [place], [place] 704-293-8524   °[facility] [address], [place] 336-845-3988 Admissions: 8am-3pm M-F  °[facility] [address]    °[place], [place] 336-841-1104   °[facility] [address], [place], [place] 336-379-7146   °[facility] [address]  °[place], [place] 336-285-9073   °[facility] Intensive Outpatient [address], [place], [place] 336-852-3033   °[facility] [address]  °[place], [place] 1-877-615-2722 or 336-784-9470   °[facility] [address] [place] 336-227-7417 Accepts Medicaid  °[facility] [address]  °[place] 1-800-659-3381 Substance Abuse/Addiction Treatment  ° °[place] Behavioral Health Resources °Organization         Address  Phone  Notes  °[facility]  (888) 581-9988   °Julie Brannon, PhD [address] [place], [place]   (336) 349-5553 or (336) 951-0000   °[facility]   [address] °[place], [place] (336) 349-4454   °[facility] [address], [place], [place] (336) 342-8316 Insurance/Medicaid/sponsorship through [facility]  °[facility] [address]                                    [place], [place] (336) 342-8316 Therapy/tele-psych/case    °[facility] [address]  ° [place], [place] (336) 349-2233    °Dr. Arfeen  (336) 349-4544   °[facility] [place]  [employer] [place] [facility] 1) [address], [place] °2) [address], [place] °3)  [address], [place] (336) 349-3220 °(336) 342-7768 ° °(336) 342-8140   °[place] [facility] (336) 342-1394 or (336) 342-3537 (After Hours)    ° ° °

## 2013-12-10 NOTE — ED Provider Notes (Signed)
CSN: [id number]     Arrival date & time 12/10/13  1655 History   First MD Initiated Contact with Patient 12/10/13 1722     Chief Complaint  Patient presents with  . Hand Pain     (Consider location/radiation/quality/duration/timing/severity/associated sxs/prior Treatment) HPI Comments: Patient presents with discoloration of her fingernail. She states that she uses artificial nails and when she went to remove her artificial nails about 3 weeks ago she noticed some discoloration and chipping off of her nail. She has a little bit of discomfort around the nail. She denies any swelling redness or drainage from the nail. She denies any fevers. She denies any injuries to the nail. She has a history of diabetes and takes her insulin as directed. She states her sugars little high today because she just came from a party. She denies any other symptoms. She has no fevers fatigue nausea vomiting weakness chest pain shortness of breath or other recent illnesses. She has recently lost her insurance and has not followed up with her doctor who [facility].   Past Medical History  Diagnosis Date  . Diabetes mellitus    Past Surgical History  Procedure Laterality Date  . Arm surgery     . Cesarean section     Family History  Problem Relation Age of Onset  . Diabetes Other    History  Substance Use Topics  . Smoking status: Never Smoker   . Smokeless tobacco: Not on file  . Alcohol Use: No   OB History   Grav Para Term Preterm Abortions TAB SAB Ect Mult Living                 Review of Systems  Constitutional: Negative for fever.  Gastrointestinal: Negative for nausea and vomiting.  Musculoskeletal: Positive for arthralgias.  Skin: Positive for color change. Negative for rash and wound.  Neurological: Negative for weakness and numbness.      Allergies  Tramadol  Home Medications   Prior to Admission medications   Medication Sig Start Date End Date  Taking? Authorizing Provider  diphenhydrAMINE (BENADRYL) 25 MG tablet Take 50 mg by mouth at bedtime as needed for itching.   Yes Historical Provider, MD  insulin NPH-regular (NOVOLIN 70/30) (70-30) 100 UNIT/ML injection Inject 15-20 Units into the skin 2 (two) times daily with a meal. Take 20 units every morning and 15 units every night   Yes Historical Provider, MD   BP 169/85  Pulse 77  Temp(Src) 98.2 F (36.8 C) (Oral)  Resp 16  Ht 5\' 2"  (1.575 m)  Wt 194 lb (87.998 kg)  BMI 35.47 kg/m2  SpO2 99% Physical Exam  Constitutional: She is oriented to person, place, and time. She appears well-developed and well-nourished.  HENT:  Head: Normocephalic and atraumatic.  Neck: Normal range of motion. Neck supple.  Cardiovascular: Normal rate.   Pulmonary/Chest: Effort normal.  Musculoskeletal: She exhibits tenderness. She exhibits no edema.  Patient has some discoloration and partial loss of the medial involving the right thumb. There is no redness swelling warmth or discharge. There some mild tenderness to the area. There is no underlying bony tenderness.  Neurological: She is alert and oriented to person, place, and time.  Skin: Skin is warm and dry.  Psychiatric: She has a normal mood and affect.      ED Course  Procedures (including critical care time) Labs Review Labs Reviewed  CBG MONITORING, ED - Abnormal; Notable for the  following:    Glucose-Capillary 297 (*)    All other components within normal limits    Imaging Review No results found.   EKG Interpretation None      MDM   Final diagnoses:  Onychomycosis    It appears the patient has some damage to the nail likely resulting from the artificial nail use. There's also some suggestion of a fungal infection of the nail. I don't see evidence of bacterial infection. I advised her to try some tea tree oil to the area. I advised her to refrain from using artificial nails until completely healed. I encouraged her to  have close followup with her primary care physician regarding maintenance of her diabetes. I advised her to call the outpatient center and see if they will still see her even if she lost her insurance. I also gave her a resource guide for other possible options for outpatient followup. I advised to return here if she has any suggestions of infection the nail which I explained to her.    Rachel Bucco, MD 12/10/13 [phone]

## 2013-12-10 NOTE — ED Notes (Addendum)
Shes had R thumbnail pain since having a manicure approximately 2 weeks ago. The fingernail is cracked, dry and falling off. She is worried her blood sugar might be high, its been in the 200s at home

## 2014-03-11 ENCOUNTER — Encounter (HOSPITAL_COMMUNITY): Payer: Self-pay | Admitting: *Deleted

## 2014-03-11 ENCOUNTER — Emergency Department (HOSPITAL_COMMUNITY)
Admission: EM | Admit: 2014-03-11 | Discharge: 2014-03-11 | Disposition: A | Discharge status: Home / Self Care | Payer: PRIVATE HEALTH INSURANCE | Attending: Emergency Medicine | Admitting: Emergency Medicine

## 2014-03-11 ENCOUNTER — Emergency Department (HOSPITAL_COMMUNITY): Payer: PRIVATE HEALTH INSURANCE

## 2014-03-11 DIAGNOSIS — R519 Headache, unspecified: Secondary | ICD-10-CM

## 2014-03-11 DIAGNOSIS — M791 Myalgia, unspecified site: Secondary | ICD-10-CM

## 2014-03-11 DIAGNOSIS — R739 Hyperglycemia, unspecified: Secondary | ICD-10-CM

## 2014-03-11 DIAGNOSIS — Z794 Long term (current) use of insulin: Secondary | ICD-10-CM | POA: Insufficient documentation

## 2014-03-11 DIAGNOSIS — E1165 Type 2 diabetes mellitus with hyperglycemia: Secondary | ICD-10-CM | POA: Insufficient documentation

## 2014-03-11 DIAGNOSIS — R51 Headache: Secondary | ICD-10-CM | POA: Insufficient documentation

## 2014-03-11 LAB — URINALYSIS, ROUTINE W REFLEX MICROSCOPIC
Bilirubin Urine: NEGATIVE
Glucose, UA: >1000 mg/dL — AB
KETONES UR: NEGATIVE mg/dL
Leukocytes, UA: NEGATIVE
Nitrite: NEGATIVE
PROTEIN: 30 mg/dL — AB
Specific Gravity, Urine: 1.027 (ref 1.005–1.030)
UROBILINOGEN UA: 0.2 mg/dL (ref 0.0–1.0)
pH: 6 (ref 5.0–8.0)

## 2014-03-11 LAB — COMPREHENSIVE METABOLIC PANEL
ALT: 14 U/L (ref 0–35)
AST: 13 U/L (ref 0–37)
Albumin: 3.2 g/dL — ABNORMAL LOW (ref 3.5–5.2)
Alkaline Phosphatase: 172 U/L — ABNORMAL HIGH (ref 39–117)
Anion gap: 5 (ref 5–15)
BUN: 18 mg/dL (ref 6–23)
CALCIUM: 9 mg/dL (ref 8.4–10.5)
CHLORIDE: 99 meq/L (ref 96–112)
CO2: 28 mmol/L (ref 19–32)
CREATININE: 1.17 mg/dL — AB (ref 0.50–1.10)
GFR calc non Af Amer: 49 mL/min — ABNORMAL LOW (ref >90)
GFR, EST AFRICAN AMERICAN: 57 mL/min — AB (ref >90)
GLUCOSE: 524 mg/dL — AB (ref 70–99)
Potassium: 4.7 mmol/L (ref 3.5–5.1)
Sodium: 132 mmol/L — ABNORMAL LOW (ref 135–145)
Total Bilirubin: 0.4 mg/dL (ref 0.3–1.2)
Total Protein: 6.4 g/dL (ref 6.0–8.3)

## 2014-03-11 LAB — CBC WITH DIFFERENTIAL/PLATELET
Basophils Absolute: 0.1 10*3/uL (ref 0.0–0.1)
Basophils Relative: 1 % (ref 0–1)
EOS PCT: 2 % (ref 0–5)
Eosinophils Absolute: 0.1 10*3/uL (ref 0.0–0.7)
HCT: 34.3 % — ABNORMAL LOW (ref 36.0–46.0)
HEMOGLOBIN: 10.9 g/dL — AB (ref 12.0–15.0)
LYMPHS ABS: 1.5 10*3/uL (ref 0.7–4.0)
Lymphocytes Relative: 35 % (ref 12–46)
MCH: 25.6 pg — ABNORMAL LOW (ref 26.0–34.0)
MCHC: 31.8 g/dL (ref 30.0–36.0)
MCV: 80.5 fL (ref 78.0–100.0)
Monocytes Absolute: 0.4 10*3/uL (ref 0.1–1.0)
Monocytes Relative: 9 % (ref 3–12)
Neutro Abs: 2.3 10*3/uL (ref 1.7–7.7)
Neutrophils Relative %: 53 % (ref 43–77)
Platelets: 194 10*3/uL (ref 150–400)
RBC: 4.26 MIL/uL (ref 3.87–5.11)
RDW: 13 % (ref 11.5–15.5)
WBC: 4.3 10*3/uL (ref 4.0–10.5)

## 2014-03-11 LAB — CBG MONITORING, ED
GLUCOSE-CAPILLARY: 244 mg/dL — AB (ref 70–99)
Glucose-Capillary: 480 mg/dL — ABNORMAL HIGH (ref 70–99)
Glucose-Capillary: 435 mg/dL — ABNORMAL HIGH (ref 70–99)

## 2014-03-11 LAB — URINE MICROSCOPIC-ADD ON

## 2014-03-11 MED ORDER — SODIUM CHLORIDE 0.9 % IV BOLUS (SEPSIS)
1000.0000 mL | Freq: Once | INTRAVENOUS | Status: AC
  Administered 2014-03-11: 1000 mL via INTRAVENOUS

## 2014-03-11 MED ORDER — INSULIN ASPART 100 UNIT/ML ~~LOC~~ SOLN
10.0000 [IU] | Freq: Once | SUBCUTANEOUS | Status: AC
Start: 2014-03-11 — End: 2014-03-11
  Administered 2014-03-11: 10 [IU] via INTRAVENOUS
  Filled 2014-03-11: qty 1

## 2014-03-11 MED ORDER — MORPHINE SULFATE 4 MG/ML IJ SOLN
4.0000 mg | Freq: Once | INTRAMUSCULAR | Status: AC
  Administered 2014-03-11: 4 mg via INTRAVENOUS
  Filled 2014-03-11: qty 1

## 2014-03-11 MED ORDER — ONDANSETRON HCL 4 MG/2ML IJ SOLN
4.0000 mg | Freq: Once | INTRAMUSCULAR | Status: AC
  Administered 2014-03-11: 4 mg via INTRAVENOUS
  Filled 2014-03-11: qty 2

## 2014-03-11 NOTE — ED Notes (Signed)
Pt currently in CT.

## 2014-03-11 NOTE — ED Notes (Signed)
Pt to CT

## 2014-03-11 NOTE — Discharge Instructions (Signed)
Please read and follow all provided instructions.  Your diagnoses today include:  1. Hyperglycemia without ketosis   2. Headache   3. Myalgia    Tests performed today include:  Blood counts and electrolytes - high blood sugar  Urine test - no infection  Head CT - no serious problems causing headache  Vital signs. See below for your results today.   Medications prescribed:   None  Take any prescribed medications only as directed.  Home care instructions:  Follow any educational materials contained in this packet.  Please get your insulin filled and start taking it as prescribed.  Follow-up instructions: Please follow-up with your primary care provider in the next 3 days for further evaluation of your symptoms.   Return instructions:   Please return to the Emergency Department if you experience worsening symptoms.   Return with chest pain, shortness of breath, fever, vomiting, or other concerns.   Return if blood sugar is higher than 400.   Please return if you have any other emergent concerns.  Additional Information:  Your vital signs today were: BP 139/68 mmHg   Pulse 84   Temp(Src) 98 F (36.7 C) (Oral)   Resp 14   SpO2 99% If your blood pressure (BP) was elevated above 135/85 this visit, please have this repeated by your doctor within one month. --------------

## 2014-03-11 NOTE — ED Provider Notes (Signed)
CSN: [id number]     Arrival date & time 03/11/14  0803 History   First MD Initiated Contact with Patient 03/11/14 [id number]     Chief Complaint  Patient presents with  . Generalized Body Aches  . Diarrhea     (Consider location/radiation/quality/duration/timing/severity/associated sxs/prior Treatment) HPI Comments: Patient with history of diabetes presents with complaint of generalized body aches, generalized fatigue, right-sided headache, watery diarrhea for the past 3 days. Patient states that she decided to come in to the emergency department today because the pain was getting "annoying". She states that she does not usually get headaches. Her headache started gradually on the right side of her head and radiates down into her right neck and down her right arm. No history of disc problems or similar headaches. No head injury. No fevers. Body aches are all over. She has taken Tylenol at home without relief. Patient denies signs of stroke including: facial droop, slurred speech, aphasia, weakness/numbness in extremities, imbalance/trouble walking. Patient has had watery, nonbloody diarrhea that was worse 3 days ago but she notes that she had 2 instances in the past 24 hours. No chest pain or shortness of breath. Exertion or position does not make pain worse or change in character. No abdominal pain. No nausea, vomiting. No urinary symptoms. She admits to running out of insulin 3 days ago after her symptoms started. She has not taken any insulin since that time. She denies polydipsia or polyuria.   Patient is a 64 y.o. female presenting with diarrhea. The history is provided by the patient and medical records.  Diarrhea   Past Medical History  Diagnosis Date  . Diabetes mellitus    Past Surgical History  Procedure Laterality Date  . Arm surgery     . Cesarean section     Family History  Problem Relation Age of Onset  . Diabetes Other    History  Substance Use Topics  . Smoking status: Never  Smoker   . Smokeless tobacco: Not on file  . Alcohol Use: No   OB History    No data available     Review of Systems  Gastrointestinal: Positive for diarrhea.  All other systems reviewed and are negative.     Allergies  Tramadol  Home Medications   Prior to Admission medications   Medication Sig Start Date End Date Taking? Authorizing Provider  acetaminophen (TYLENOL) 500 MG tablet Take 1,000 mg by mouth every 8 (eight) hours as needed for moderate pain.   Yes Historical Provider, MD  diphenhydrAMINE (BENADRYL) 25 MG tablet Take 25 mg by mouth every 8 (eight) hours as needed for itching or allergies.    Yes Historical Provider, MD  insulin NPH-regular (NOVOLIN 70/30) (70-30) 100 UNIT/ML injection Inject 15-20 Units into the skin 2 (two) times daily with a meal. Take 20 units every morning and 15 units every night   Yes Historical Provider, MD   BP 159/73 mmHg  Pulse 75  Temp(Src) 98 F (36.7 C) (Oral)  Resp 18  SpO2 98%   Physical Exam  Constitutional: She is oriented to person, place, and time. She appears well-developed and well-nourished.  HENT:  Head: Normocephalic and atraumatic.  Right Ear: Tympanic membrane, external ear and ear canal normal.  Left Ear: Tympanic membrane, external ear and ear canal normal.  Nose: Nose normal.  Mouth/Throat: Uvula is midline, oropharynx is clear and moist and mucous membranes are normal.  Eyes: Conjunctivae, EOM and lids are normal. Pupils are equal, round, and reactive  to light. Right eye exhibits no discharge. Left eye exhibits no discharge. Right eye exhibits no nystagmus. Left eye exhibits no nystagmus.  Neck: Normal range of motion. Neck supple.  Cardiovascular: Normal rate, regular rhythm and normal heart sounds.   No murmur heard. Pulmonary/Chest: Effort normal and breath sounds normal. No respiratory distress. She has no wheezes. She has no rales.  Abdominal: Soft. There is no tenderness. There is no rebound and no  guarding.  Musculoskeletal: She exhibits no edema or tenderness.       Cervical back: She exhibits normal range of motion, no tenderness and no bony tenderness.  Neurological: She is alert and oriented to person, place, and time. She has normal strength and normal reflexes. No cranial nerve deficit or sensory deficit. She displays a negative Romberg sign. Coordination normal. GCS eye subscore is 4. GCS verbal subscore is 5. GCS motor subscore is 6.  Skin: Skin is warm and dry.  Psychiatric: She has a normal mood and affect.  Nursing note and vitals reviewed.   ED Course  Procedures (including critical care time) Labs Review Labs Reviewed  CBC WITH DIFFERENTIAL - Abnormal; Notable for the following:    Hemoglobin 10.9 (*)    HCT 34.3 (*)    MCH 25.6 (*)    All other components within normal limits  COMPREHENSIVE METABOLIC PANEL - Abnormal; Notable for the following:    Sodium 132 (*)    Glucose, Bld 524 (*)    Creatinine, Ser 1.17 (*)    Albumin 3.2 (*)    Alkaline Phosphatase 172 (*)    GFR calc non Af Amer 49 (*)    GFR calc Af Amer 57 (*)    All other components within normal limits  URINALYSIS, ROUTINE W REFLEX MICROSCOPIC - Abnormal; Notable for the following:    Glucose, UA >1000 (*)    Hgb urine dipstick SMALL (*)    Protein, ur 30 (*)    All other components within normal limits  CBG MONITORING, ED - Abnormal; Notable for the following:    Glucose-Capillary 480 (*)    All other components within normal limits  CBG MONITORING, ED - Abnormal; Notable for the following:    Glucose-Capillary 435 (*)    All other components within normal limits  CBG MONITORING, ED - Abnormal; Notable for the following:    Glucose-Capillary 244 (*)    All other components within normal limits  URINE MICROSCOPIC-ADD ON    Imaging Review Ct Head Wo Contrast  03/11/2014   CLINICAL DATA:  Generalized headache of 2 days duration.  EXAM: CT HEAD WITHOUT CONTRAST  TECHNIQUE: Contiguous axial  images were obtained from the base of the skull through the vertex without intravenous contrast.  COMPARISON:  11/21/2007  FINDINGS: The brain has a normal appearance without evidence of atrophy, infarction, mass lesion, hemorrhage, hydrocephalus or extra-axial collection. The calvarium is unremarkable. The paranasal sinuses, middle ears and mastoids are clear.  IMPRESSION: Normal head CT.  No abnormality seen to explain headache.   Electronically Signed   By: Paulina Fusi M.D.   On: 03/11/2014 10:04     EKG Interpretation   Date/Time:  Sunday March 11 2014 09:14:54 EST Ventricular Rate:  76 PR Interval:  181 QRS Duration: 81 QT Interval:  381 QTC Calculation: 428 R Axis:   66 Text Interpretation:  Sinus rhythm Probable left atrial enlargement No  significant change since last tracing Confirmed by Anitra Lauth  MD, Alphonzo Lemmings  [phone]) on 03/11/2014 9:23:56  AM       9:03 AM Patient seen and examined. Work-up initiated. Medications ordered.   Vital signs reviewed and are as follows: BP 159/73 mmHg  Pulse 75  Temp(Src) 98 F (36.7 C) (Oral)  Resp 18  SpO2 98%  Patient with multiple vague complaints. She has had infrequent visits in the past with similar complaints, usually with itching in groin which she does not complain about today. Work-up ordered. Will treat hyperglycemia, ensure no DKA, other problems. She does not describe thunderclap HA and I have low suspicion for bleeding in brain given overall constellation of symptoms, but will check head CT given new onset HA.   10:44 AM AG normal. Head CT normal. Will give additional fluids, IV insulin bolus. Will continue to monitor.   1:36 PM Patient has done well while being in ED. Morphine/zofran helped symptoms including HA. She has received IV fluids and IV insulin with improvement in CBG to 244.   She has seen teaching service in past and will try to schedule an appointment.   She states that after discharge, she will go to the pharmacy  to fill her insulin and begin taking as she normally does.  Patient counseled to return if they have worsening HA, weakness in their arms or legs, slurred speech, trouble walking or talking, confusion, trouble with their balance, or if they have any other concerns. Encouraged to return with fever, chest pain, shortness of breath. Patient verbalizes understanding and agrees with plan.    MDM   Final diagnoses:  Headache  Hyperglycemia without ketosis  Myalgia   HA: Patient without high-risk features of headache including: sudden onset/thunderclap HA, altered mental status, accompanying seizure, headache with exertion, history of immunocompromised, neck or shoulder pain, fever, use of anticoagulation, family history of spontaneous SAH, concomitant drug use, toxic exposure.   CT ordered given new type HA and age. CT neg.   Patient has a normal complete neurological exam, normal vital signs, normal level of consciousness, no signs of meningismus, is well-appearing/non-toxic appearing,no signs of trauma, no pain over the temporal arteries.   Hyperglycemia: Normal anion gap. Patient treated with fluids and insulin in the emergency department with improvement in blood sugar.  Myalgia: Patient afebrile. Urine does not show infection. CBC is unremarkable at baseline. No concerning findings on CMP, alkaline phosphatase noted to be mildly elevated. Do not suspect gallbladder disease. Calcium normal. Do not have a great suspicion for occult cancer.  No chest pain or shortness of breath to suggest ACS. Neck pain is more related to her headache in the do not suspect this is an anginal equivalent.  Appropriate return instructions discussed with patient at bedside.  No dangerous or life-threatening conditions suspected or identified by history, physical exam, and by work-up. No indications for hospitalization identified.      Renne Crigler, PA-C 03/11/14 1344  Gwyneth Sprout, MD 03/12/14 1530

## 2014-03-11 NOTE — ED Notes (Signed)
Pt reports generalized bodyaches, headache, dental pain and diarrhea for several days. No acute distress noted at this time.

## 2014-03-20 ENCOUNTER — Encounter (HOSPITAL_COMMUNITY): Payer: Self-pay

## 2014-03-20 ENCOUNTER — Emergency Department (HOSPITAL_COMMUNITY)
Admission: EM | Admit: 2014-03-20 | Discharge: 2014-03-20 | Disposition: A | Discharge status: Home / Self Care | Payer: PRIVATE HEALTH INSURANCE | Attending: Emergency Medicine | Admitting: Emergency Medicine

## 2014-03-20 DIAGNOSIS — Z9889 Other specified postprocedural states: Secondary | ICD-10-CM | POA: Insufficient documentation

## 2014-03-20 DIAGNOSIS — Z794 Long term (current) use of insulin: Secondary | ICD-10-CM | POA: Insufficient documentation

## 2014-03-20 DIAGNOSIS — E119 Type 2 diabetes mellitus without complications: Secondary | ICD-10-CM | POA: Insufficient documentation

## 2014-03-20 DIAGNOSIS — R109 Unspecified abdominal pain: Secondary | ICD-10-CM | POA: Insufficient documentation

## 2014-03-20 LAB — COMPREHENSIVE METABOLIC PANEL
ALK PHOS: 156 U/L — AB (ref 39–117)
ALT: 12 U/L (ref 0–35)
AST: 17 U/L (ref 0–37)
Albumin: 3.3 g/dL — ABNORMAL LOW (ref 3.5–5.2)
Anion gap: 3 — ABNORMAL LOW (ref 5–15)
BILIRUBIN TOTAL: 0.5 mg/dL (ref 0.3–1.2)
BUN: 17 mg/dL (ref 6–23)
CHLORIDE: 102 meq/L (ref 96–112)
CO2: 27 mmol/L (ref 19–32)
Calcium: 8.6 mg/dL (ref 8.4–10.5)
Creatinine, Ser: 1.1 mg/dL (ref 0.50–1.10)
GFR calc Af Amer: 61 mL/min — ABNORMAL LOW (ref >90)
GFR calc non Af Amer: 53 mL/min — ABNORMAL LOW (ref >90)
Glucose, Bld: 296 mg/dL — ABNORMAL HIGH (ref 70–99)
POTASSIUM: 4.3 mmol/L (ref 3.5–5.1)
SODIUM: 132 mmol/L — AB (ref 135–145)
Total Protein: 6.6 g/dL (ref 6.0–8.3)

## 2014-03-20 LAB — URINE MICROSCOPIC-ADD ON

## 2014-03-20 LAB — URINALYSIS, ROUTINE W REFLEX MICROSCOPIC
BILIRUBIN URINE: NEGATIVE
Ketones, ur: NEGATIVE mg/dL
Leukocytes, UA: NEGATIVE
Nitrite: NEGATIVE
PROTEIN: 30 mg/dL — AB
Specific Gravity, Urine: 1.017 (ref 1.005–1.030)
UROBILINOGEN UA: 0.2 mg/dL (ref 0.0–1.0)
pH: 6.5 (ref 5.0–8.0)

## 2014-03-20 LAB — CBC WITH DIFFERENTIAL/PLATELET
BASOS ABS: 0.1 10*3/uL (ref 0.0–0.1)
Basophils Relative: 1 % (ref 0–1)
Eosinophils Absolute: 0.1 10*3/uL (ref 0.0–0.7)
Eosinophils Relative: 2 % (ref 0–5)
HCT: 34.1 % — ABNORMAL LOW (ref 36.0–46.0)
Hemoglobin: 11.1 g/dL — ABNORMAL LOW (ref 12.0–15.0)
LYMPHS ABS: 1.6 10*3/uL (ref 0.7–4.0)
LYMPHS PCT: 35 % (ref 12–46)
MCH: 26.7 pg (ref 26.0–34.0)
MCHC: 32.6 g/dL (ref 30.0–36.0)
MCV: 82 fL (ref 78.0–100.0)
Monocytes Absolute: 0.3 10*3/uL (ref 0.1–1.0)
Monocytes Relative: 5 % (ref 3–12)
NEUTROS ABS: 2.7 10*3/uL (ref 1.7–7.7)
NEUTROS PCT: 57 % (ref 43–77)
PLATELETS: 221 10*3/uL (ref 150–400)
RBC: 4.16 MIL/uL (ref 3.87–5.11)
RDW: 13.3 % (ref 11.5–15.5)
WBC: 4.7 10*3/uL (ref 4.0–10.5)

## 2014-03-20 MED ORDER — OXYCODONE-ACETAMINOPHEN 5-325 MG PO TABS: 2.0000 | ORAL_TABLET | Freq: Once | ORAL | Status: DC

## 2014-03-20 MED ORDER — OXYCODONE-ACETAMINOPHEN 5-325 MG PO TABS
2.0000 | ORAL_TABLET | Freq: Once | ORAL | Status: AC
  Administered 2014-03-20: 2 via ORAL
  Filled 2014-03-20: qty 2

## 2014-03-20 MED ORDER — VALACYCLOVIR HCL 1 G PO TABS: 1000.0000 mg | ORAL_TABLET | Freq: Three times a day (TID) | ORAL | Status: AC

## 2014-03-20 NOTE — Discharge Instructions (Signed)
Abdominal Pain, Women °Abdominal (stomach, pelvic, or belly) pain can be caused by many things. It is important to tell your doctor: °· The location of the pain. °· Does it come and go or is it present all the time? °· Are there things that start the pain (eating certain foods, exercise)? °· Are there other symptoms associated with the pain (fever, nausea, vomiting, diarrhea)? °All of this is helpful to know when trying to find the cause of the pain. °CAUSES  °· Stomach: virus or bacteria infection, or ulcer. °· Intestine: appendicitis (inflamed appendix), regional ileitis (Crohn's disease), ulcerative colitis (inflamed colon), irritable bowel syndrome, diverticulitis (inflamed diverticulum of the colon), or cancer of the stomach or intestine. °· Gallbladder disease or stones in the gallbladder. °· Kidney disease, kidney stones, or infection. °· Pancreas infection or cancer. °· Fibromyalgia (pain disorder). °· Diseases of the female organs: °¨ Uterus: fibroid (non-cancerous) tumors or infection. °¨ Fallopian tubes: infection or tubal pregnancy. °¨ Ovary: cysts or tumors. °¨ Pelvic adhesions (scar tissue). °¨ Endometriosis (uterus lining tissue growing in the pelvis and on the pelvic organs). °¨ Pelvic congestion syndrome (female organs filling up with blood just before the menstrual period). °¨ Pain with the menstrual period. °¨ Pain with ovulation (producing an egg). °¨ Pain with an IUD (intrauterine device, birth control) in the uterus. °¨ Cancer of the female organs. °· Functional pain (pain not caused by a disease, may improve without treatment). °· Psychological pain. °· Depression. °DIAGNOSIS  °Your doctor will decide the seriousness of your pain by doing an examination. °· Blood tests. °· X-rays. °· Ultrasound. °· CT scan (computed tomography, special type of X-ray). °· MRI (magnetic resonance imaging). °· Cultures, for infection. °· Barium enema (dye inserted in the large intestine, to better view it with  X-rays). °· Colonoscopy (looking in intestine with a lighted tube). °· Laparoscopy (minor surgery, looking in abdomen with a lighted tube). °· Major abdominal exploratory surgery (looking in abdomen with a large incision). °TREATMENT  °The treatment will depend on the cause of the pain.  °· Many cases can be observed and treated at home. °· Over-the-counter medicines recommended by your caregiver. °· Prescription medicine. °· Antibiotics, for infection. °· Birth control pills, for painful periods or for ovulation pain. °· Hormone treatment, for endometriosis. °· Nerve blocking injections. °· Physical therapy. °· Antidepressants. °· Counseling with a psychologist or psychiatrist. °· Minor or major surgery. °HOME CARE INSTRUCTIONS  °· Do not take laxatives, unless directed by your caregiver. °· Take over-the-counter pain medicine only if ordered by your caregiver. Do not take aspirin because it can cause an upset stomach or bleeding. °· Try a clear liquid diet (broth or water) as ordered by your caregiver. Slowly move to a bland diet, as tolerated, if the pain is related to the stomach or intestine. °· Have a thermometer and take your temperature several times a day, and record it. °· Bed rest and sleep, if it helps the pain. °· Avoid sexual intercourse, if it causes pain. °· Avoid stressful situations. °· Keep your follow-up appointments and tests, as your caregiver orders. °· If the pain does not go away with medicine or surgery, you may try: °¨ Acupuncture. °¨ Relaxation exercises (yoga, meditation). °¨ Group therapy. °¨ Counseling. °SEEK MEDICAL CARE IF:  °· You notice certain foods cause stomach pain. °· Your home care treatment is not helping your pain. °· You need stronger pain medicine. °· You want your IUD removed. °· You feel faint or   lightheaded. °· You develop nausea and vomiting. °· You develop a rash. °· You are having side effects or an allergy to your medicine. °SEEK IMMEDIATE MEDICAL CARE IF:  °· Your  pain does not go away or gets worse. °· You have a fever. °· Your pain is felt only in portions of the abdomen. The right side could possibly be appendicitis. The left lower portion of the abdomen could be colitis or diverticulitis. °· You are passing blood in your stools (bright red or black tarry stools, with or without vomiting). °· You have blood in your urine. °· You develop chills, with or without a fever. °· You pass out. °MAKE SURE YOU:  °· Understand these instructions. °· Will watch your condition. °· Will get help right away if you are not doing well or get worse. °Document Released: 12/21/2006 Document Revised: 07/10/2013 Document Reviewed: 01/10/2009 °[employer]® Patient Information ©2015 [employer]. This information is not intended to replace advice given to you by your health care provider. Make sure you discuss any questions you have with your health care provider. ° °

## 2014-03-20 NOTE — ED Notes (Signed)
Pt has been having pain in her left hip since Saturday. No known injury. No fall.

## 2014-03-20 NOTE — ED Provider Notes (Signed)
CSN: [id number]     Arrival date & time 03/20/14  4098 History   First MD Initiated Contact with Patient 03/20/14 [id number]     Chief Complaint  Patient presents with  . Hip Pain      HPI   This patient presents with complaint of 2 to three-day history of a burning discomfort in the left flank which radiates around to the anterior part left hip.  Discomfort is described as on the skin.  Is not made worse with movement of the hip.  She denies any trauma to the hip.  Denies fever chills.  Denies any known rash this time.  She says very sensitive when you touch the area.   Past Medical History  Diagnosis Date  . Diabetes mellitus    Past Surgical History  Procedure Laterality Date  . Arm surgery     . Cesarean section     Family History  Problem Relation Age of Onset  . Diabetes Other    History  Substance Use Topics  . Smoking status: Never Smoker   . Smokeless tobacco: Not on file  . Alcohol Use: No   OB History    No data available     Review of Systems  Physical Exam  Nursing note and vitals reviewed. Constitutional: She is oriented to person, place, and time. She appears well-developed and well-nourished. No distress.  HENT:  Head: Normocephalic and atraumatic.  Eyes: Pupils are equal, round, and reactive to light.  Neck: Normal range of motion.  Cardiovascular: Normal rate and intact distal pulses.   Pulmonary/Chest: No respiratory distress.  Abdominal: Normal appearance. She exhibits no distension. No abdominal tenderness.  No masses.    Musculoskeletal: Normal range of motion. Movement of the hip does not reproduce the pain  Physical Exam  Abdominal:      Neurological: She is alert and oriented to person, place, and time. No cranial nerve deficit.  Skin: Skin is warm and dry. No rash noted.  Psychiatric: She has a normal mood and affect. Her behavior is normal.   No rash is present but the distribution is consistent with early zoster.   Allergies   Tramadol  Home Medications   Prior to Admission medications   Medication Sig Start Date End Date Taking? Authorizing Provider  insulin NPH-regular (NOVOLIN 70/30) (70-30) 100 UNIT/ML injection Inject 15-20 Units into the skin 2 (two) times daily with a meal. Take 20 units every morning and 15 units every night   Yes Historical Provider, MD  acetaminophen (TYLENOL) 500 MG tablet Take 1,000 mg by mouth every 8 (eight) hours as needed for moderate pain.    Historical Provider, MD  diphenhydrAMINE (BENADRYL) 25 MG tablet Take 25 mg by mouth every 8 (eight) hours as needed for itching or allergies.     Historical Provider, MD  oxyCODONE-acetaminophen (PERCOCET/ROXICET) 5-325 MG per tablet Take 2 tablets by mouth once. 03/20/14   Nelia Shi, MD  valACYclovir (VALTREX) 1000 MG tablet Take 1 tablet (1,000 mg total) by mouth 3 (three) times daily. 03/20/14 04/03/14  Nelia Shi, MD   BP 191/94 mmHg  Pulse 69  Temp(Src) 97.9 F (36.6 C) (Oral)  Resp 16  Ht  (1.6 m)  Wt 185 lb (83.915 kg)  BMI 32.78 kg/m2  SpO2 98% Physical Exam  ED Course  Procedures (including critical care time) Labs Review Labs Reviewed  URINALYSIS, ROUTINE W REFLEX MICROSCOPIC - Abnormal; Notable for the following:    Glucose, UA >  1000 (*)    Hgb urine dipstick SMALL (*)    Protein, ur 30 (*)    All other components within normal limits  CBC WITH DIFFERENTIAL - Abnormal; Notable for the following:    Hemoglobin 11.1 (*)    HCT 34.1 (*)    All other components within normal limits  COMPREHENSIVE METABOLIC PANEL - Abnormal; Notable for the following:    Sodium 132 (*)    Glucose, Bld 296 (*)    Albumin 3.3 (*)    Alkaline Phosphatase 156 (*)    GFR calc non Af Amer 53 (*)    GFR calc Af Amer 61 (*)    Anion gap 3 (*)    All other components within normal limits  URINE MICROSCOPIC-ADD ON       MDM   Final diagnoses:  Flank pain        Nelia Shiobert L Makinsley Schiavi, MD 03/20/14 1038

## 2014-05-06 ENCOUNTER — Other Ambulatory Visit (HOSPITAL_COMMUNITY): Payer: Self-pay

## 2014-05-06 ENCOUNTER — Inpatient Hospital Stay (HOSPITAL_COMMUNITY)
Admission: EM | Admit: 2014-05-06 | Discharge: 2014-05-16 | DRG: 854 | Disposition: A | Discharge status: Skilled Nursing Facility | Payer: 59 | Attending: Internal Medicine | Admitting: Internal Medicine

## 2014-05-06 ENCOUNTER — Emergency Department (HOSPITAL_COMMUNITY): Payer: 59

## 2014-05-06 ENCOUNTER — Encounter (HOSPITAL_COMMUNITY): Payer: Self-pay | Admitting: Emergency Medicine

## 2014-05-06 DIAGNOSIS — E1165 Type 2 diabetes mellitus with hyperglycemia: Secondary | ICD-10-CM | POA: Diagnosis present

## 2014-05-06 DIAGNOSIS — L0390 Cellulitis, unspecified: Secondary | ICD-10-CM | POA: Diagnosis present

## 2014-05-06 DIAGNOSIS — D649 Anemia, unspecified: Secondary | ICD-10-CM

## 2014-05-06 DIAGNOSIS — D638 Anemia in other chronic diseases classified elsewhere: Secondary | ICD-10-CM | POA: Diagnosis present

## 2014-05-06 DIAGNOSIS — D509 Iron deficiency anemia, unspecified: Secondary | ICD-10-CM | POA: Diagnosis present

## 2014-05-06 DIAGNOSIS — N183 Chronic kidney disease, stage 3 (moderate): Secondary | ICD-10-CM | POA: Diagnosis present

## 2014-05-06 DIAGNOSIS — Z6833 Body mass index (BMI) 33.0-33.9, adult: Secondary | ICD-10-CM

## 2014-05-06 DIAGNOSIS — E1140 Type 2 diabetes mellitus with diabetic neuropathy, unspecified: Secondary | ICD-10-CM | POA: Diagnosis present

## 2014-05-06 DIAGNOSIS — S91131A Puncture wound without foreign body of right great toe without damage to nail, initial encounter: Secondary | ICD-10-CM | POA: Diagnosis present

## 2014-05-06 DIAGNOSIS — I10 Essential (primary) hypertension: Secondary | ICD-10-CM | POA: Diagnosis present

## 2014-05-06 DIAGNOSIS — E11649 Type 2 diabetes mellitus with hypoglycemia without coma: Secondary | ICD-10-CM | POA: Diagnosis present

## 2014-05-06 DIAGNOSIS — D6489 Other specified anemias: Secondary | ICD-10-CM | POA: Diagnosis not present

## 2014-05-06 DIAGNOSIS — E669 Obesity, unspecified: Secondary | ICD-10-CM | POA: Diagnosis present

## 2014-05-06 DIAGNOSIS — N179 Acute kidney failure, unspecified: Secondary | ICD-10-CM | POA: Diagnosis present

## 2014-05-06 DIAGNOSIS — L03115 Cellulitis of right lower limb: Secondary | ICD-10-CM | POA: Diagnosis not present

## 2014-05-06 DIAGNOSIS — A419 Sepsis, unspecified organism: Secondary | ICD-10-CM | POA: Diagnosis not present

## 2014-05-06 DIAGNOSIS — I159 Secondary hypertension, unspecified: Secondary | ICD-10-CM

## 2014-05-06 DIAGNOSIS — L03031 Cellulitis of right toe: Secondary | ICD-10-CM | POA: Diagnosis present

## 2014-05-06 DIAGNOSIS — E8770 Fluid overload, unspecified: Secondary | ICD-10-CM | POA: Diagnosis not present

## 2014-05-06 DIAGNOSIS — E1169 Type 2 diabetes mellitus with other specified complication: Secondary | ICD-10-CM | POA: Diagnosis not present

## 2014-05-06 DIAGNOSIS — Z794 Long term (current) use of insulin: Secondary | ICD-10-CM | POA: Diagnosis not present

## 2014-05-06 DIAGNOSIS — K5900 Constipation, unspecified: Secondary | ICD-10-CM | POA: Diagnosis not present

## 2014-05-06 DIAGNOSIS — M79671 Pain in right foot: Secondary | ICD-10-CM | POA: Diagnosis not present

## 2014-05-06 DIAGNOSIS — IMO0002 Reserved for concepts with insufficient information to code with codable children: Secondary | ICD-10-CM | POA: Diagnosis present

## 2014-05-06 DIAGNOSIS — I509 Heart failure, unspecified: Secondary | ICD-10-CM | POA: Diagnosis not present

## 2014-05-06 DIAGNOSIS — K219 Gastro-esophageal reflux disease without esophagitis: Secondary | ICD-10-CM | POA: Diagnosis present

## 2014-05-06 DIAGNOSIS — R739 Hyperglycemia, unspecified: Secondary | ICD-10-CM

## 2014-05-06 LAB — HEPATIC FUNCTION PANEL
ALBUMIN: 2.9 g/dL — AB (ref 3.5–5.2)
ALK PHOS: 122 U/L — AB (ref 39–117)
ALT: 13 U/L (ref 0–35)
AST: 13 U/L (ref 0–37)
Bilirubin, Direct: 0.1 mg/dL (ref 0.0–0.5)
Indirect Bilirubin: 0.5 mg/dL (ref 0.3–0.9)
TOTAL PROTEIN: 6.6 g/dL (ref 6.0–8.3)
Total Bilirubin: 0.6 mg/dL (ref 0.3–1.2)

## 2014-05-06 LAB — CBC
HEMATOCRIT: 29.3 % — AB (ref 36.0–46.0)
Hemoglobin: 9.4 g/dL — ABNORMAL LOW (ref 12.0–15.0)
MCH: 26.1 pg (ref 26.0–34.0)
MCHC: 32.1 g/dL (ref 30.0–36.0)
MCV: 81.4 fL (ref 78.0–100.0)
PLATELETS: 228 10*3/uL (ref 150–400)
RBC: 3.6 MIL/uL — ABNORMAL LOW (ref 3.87–5.11)
RDW: 13.6 % (ref 11.5–15.5)
WBC: 8.8 10*3/uL (ref 4.0–10.5)

## 2014-05-06 LAB — BASIC METABOLIC PANEL
Anion gap: 7 (ref 5–15)
BUN: 19 mg/dL (ref 6–23)
CO2: 27 mmol/L (ref 19–32)
CREATININE: 1.19 mg/dL — AB (ref 0.50–1.10)
Calcium: 9.1 mg/dL (ref 8.4–10.5)
Chloride: 101 mmol/L (ref 96–112)
GFR calc Af Amer: 56 mL/min — ABNORMAL LOW (ref >90)
GFR, EST NON AFRICAN AMERICAN: 48 mL/min — AB (ref >90)
Glucose, Bld: 447 mg/dL — ABNORMAL HIGH (ref 70–99)
POTASSIUM: 4.7 mmol/L (ref 3.5–5.1)
Sodium: 135 mmol/L (ref 135–145)

## 2014-05-06 LAB — POC OCCULT BLOOD, ED: FECAL OCCULT BLD: NEGATIVE

## 2014-05-06 LAB — CBG MONITORING, ED: GLUCOSE-CAPILLARY: 259 mg/dL — AB (ref 70–99)

## 2014-05-06 LAB — I-STAT TROPONIN, ED: Troponin i, poc: 0.01 ng/mL (ref 0.00–0.08)

## 2014-05-06 LAB — GLUCOSE, CAPILLARY
Glucose-Capillary: 207 mg/dL — ABNORMAL HIGH (ref 70–99)
Glucose-Capillary: 189 mg/dL — ABNORMAL HIGH (ref 70–99)

## 2014-05-06 LAB — SEDIMENTATION RATE: Sed Rate: 115 mm/hr — ABNORMAL HIGH (ref 0–22)

## 2014-05-06 LAB — CG4 I-STAT (LACTIC ACID): LACTIC ACID, VENOUS: 0.91 mmol/L (ref 0.5–2.0)

## 2014-05-06 LAB — PHOSPHORUS: PHOSPHORUS: 2.5 mg/dL (ref 2.3–4.6)

## 2014-05-06 LAB — TSH: TSH: 2.685 u[IU]/mL (ref 0.350–4.500)

## 2014-05-06 LAB — I-STAT CG4 LACTIC ACID, ED: Lactic Acid, Venous: 1.24 mmol/L (ref 0.5–2.0)

## 2014-05-06 LAB — MAGNESIUM: MAGNESIUM: 1.9 mg/dL (ref 1.5–2.5)

## 2014-05-06 MED ORDER — GABAPENTIN 100 MG PO CAPS
100.0000 mg | ORAL_CAPSULE | Freq: Three times a day (TID) | ORAL | Status: DC
  Administered 2014-05-06 – 2014-05-16 (×27): 100 mg via ORAL
  Filled 2014-05-06 (×32): qty 1

## 2014-05-06 MED ORDER — INSULIN DETEMIR 100 UNIT/ML ~~LOC~~ SOLN
15.0000 [IU] | Freq: Every day | SUBCUTANEOUS | Status: DC
  Administered 2014-05-06: 15 [IU] via SUBCUTANEOUS
  Filled 2014-05-06 (×2): qty 0.15

## 2014-05-06 MED ORDER — OXYCODONE-ACETAMINOPHEN 5-325 MG PO TABS: 1.0000 | ORAL_TABLET | ORAL | Status: DC | PRN

## 2014-05-06 MED ORDER — CLINDAMYCIN PHOSPHATE 600 MG/50ML IV SOLN: 600.0000 mg | Freq: Once | INTRAVENOUS | Status: DC

## 2014-05-06 MED ORDER — TETANUS-DIPHTHERIA TOXOIDS TD 5-2 LFU IM INJ
0.5000 mL | INJECTION | Freq: Once | INTRAMUSCULAR | Status: AC
  Administered 2014-05-07: 0.5 mL via INTRAMUSCULAR
  Filled 2014-05-06: qty 0.5

## 2014-05-06 MED ORDER — HEPARIN SODIUM (PORCINE) 5000 UNIT/ML IJ SOLN
5000.0000 [IU] | Freq: Three times a day (TID) | INTRAMUSCULAR | Status: DC
  Administered 2014-05-06 – 2014-05-08 (×6): 5000 [IU] via SUBCUTANEOUS
  Filled 2014-05-06 (×11): qty 1

## 2014-05-06 MED ORDER — ONDANSETRON HCL 4 MG/2ML IJ SOLN: 4.0000 mg | Freq: Four times a day (QID) | INTRAMUSCULAR | Status: DC | PRN

## 2014-05-06 MED ORDER — MORPHINE SULFATE 4 MG/ML IJ SOLN
4.0000 mg | Freq: Once | INTRAMUSCULAR | Status: AC
  Administered 2014-05-06: 4 mg via INTRAVENOUS
  Filled 2014-05-06: qty 1

## 2014-05-06 MED ORDER — ONDANSETRON HCL 4 MG/2ML IJ SOLN
4.0000 mg | Freq: Once | INTRAMUSCULAR | Status: AC
  Administered 2014-05-06: 4 mg via INTRAVENOUS
  Filled 2014-05-06: qty 2

## 2014-05-06 MED ORDER — POLYETHYLENE GLYCOL 3350 17 G PO PACK
17.0000 g | PACK | Freq: Every day | ORAL | Status: DC | PRN
Start: 2014-05-06 — End: 2014-05-16
  Administered 2014-05-07 – 2014-05-15 (×4): 17 g via ORAL
  Filled 2014-05-06 (×7): qty 1

## 2014-05-06 MED ORDER — ACETAMINOPHEN 650 MG RE SUPP: 650.0000 mg | Freq: Four times a day (QID) | RECTAL | Status: DC | PRN

## 2014-05-06 MED ORDER — HYDRALAZINE HCL 20 MG/ML IJ SOLN
10.0000 mg | Freq: Once | INTRAMUSCULAR | Status: AC
  Administered 2014-05-06: 10 mg via INTRAVENOUS
  Filled 2014-05-06: qty 1

## 2014-05-06 MED ORDER — PIPERACILLIN-TAZOBACTAM 3.375 G IVPB
3.3750 g | Freq: Once | INTRAVENOUS | Status: AC
  Administered 2014-05-06: 3.375 g via INTRAVENOUS
  Filled 2014-05-06: qty 50

## 2014-05-06 MED ORDER — INSULIN ASPART 100 UNIT/ML ~~LOC~~ SOLN
0.0000 [IU] | Freq: Three times a day (TID) | SUBCUTANEOUS | Status: DC
  Administered 2014-05-07 (×2): 8 [IU] via SUBCUTANEOUS
  Administered 2014-05-07: 2 [IU] via SUBCUTANEOUS
  Administered 2014-05-08: 15 [IU] via SUBCUTANEOUS

## 2014-05-06 MED ORDER — ISOSORB DINITRATE-HYDRALAZINE 20-37.5 MG PO TABS
1.0000 | ORAL_TABLET | Freq: Two times a day (BID) | ORAL | Status: DC
  Administered 2014-05-06 – 2014-05-16 (×20): 1 via ORAL
  Filled 2014-05-06 (×22): qty 1

## 2014-05-06 MED ORDER — SODIUM CHLORIDE 0.9 % IV SOLN
INTRAVENOUS | Status: DC
  Administered 2014-05-06 – 2014-05-10 (×5): via INTRAVENOUS

## 2014-05-06 MED ORDER — VANCOMYCIN HCL IN DEXTROSE 1-5 GM/200ML-% IV SOLN
1000.0000 mg | Freq: Once | INTRAVENOUS | Status: AC
  Administered 2014-05-06: 1000 mg via INTRAVENOUS
  Filled 2014-05-06: qty 200

## 2014-05-06 MED ORDER — SODIUM CHLORIDE 0.9 % IV BOLUS (SEPSIS)
500.0000 mL | Freq: Once | INTRAVENOUS | Status: AC
  Administered 2014-05-06: 500 mL via INTRAVENOUS

## 2014-05-06 MED ORDER — PANTOPRAZOLE SODIUM 40 MG PO TBEC
40.0000 mg | DELAYED_RELEASE_TABLET | Freq: Every day | ORAL | Status: DC
Start: 2014-05-07 — End: 2014-05-16
  Administered 2014-05-07 – 2014-05-16 (×10): 40 mg via ORAL
  Filled 2014-05-06 (×8): qty 1

## 2014-05-06 MED ORDER — ONDANSETRON HCL 4 MG PO TABS: 4.0000 mg | ORAL_TABLET | Freq: Four times a day (QID) | ORAL | Status: DC | PRN

## 2014-05-06 MED ORDER — MORPHINE SULFATE 2 MG/ML IJ SOLN
2.0000 mg | INTRAMUSCULAR | Status: DC | PRN
  Administered 2014-05-07 – 2014-05-09 (×5): 2 mg via INTRAVENOUS
  Filled 2014-05-06 (×6): qty 1

## 2014-05-06 MED ORDER — VANCOMYCIN HCL 10 G IV SOLR
1500.0000 mg | INTRAVENOUS | Status: DC
  Administered 2014-05-07 – 2014-05-08 (×2): 1500 mg via INTRAVENOUS
  Filled 2014-05-06 (×2): qty 1500

## 2014-05-06 MED ORDER — SODIUM CHLORIDE 0.9 % IV SOLN: INTRAVENOUS | Status: DC

## 2014-05-06 MED ORDER — PIPERACILLIN-TAZOBACTAM 3.375 G IVPB
3.3750 g | Freq: Three times a day (TID) | INTRAVENOUS | Status: AC
  Administered 2014-05-07 – 2014-05-10 (×11): 3.375 g via INTRAVENOUS
  Filled 2014-05-06 (×13): qty 50

## 2014-05-06 MED ORDER — ACETAMINOPHEN 325 MG PO TABS
650.0000 mg | ORAL_TABLET | Freq: Four times a day (QID) | ORAL | Status: DC | PRN
  Administered 2014-05-06 – 2014-05-15 (×6): 650 mg via ORAL
  Filled 2014-05-06 (×6): qty 2

## 2014-05-06 NOTE — ED Notes (Signed)
PA at bedside.

## 2014-05-06 NOTE — H&P (Signed)
[facility] History and Physical  Rachel Gonzalez WUJ:[id number] DOB: [birth year][name]3 DOA: 05/06/2014  Referring physician: Dr. Patria Gonzalez PCP: Pcp Not In System   Chief Complaint: right leg and foot cellulitis  HPI: Rachel Gonzalez is a 64 y.o. 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 Patient reported noticing some blood in her socks on Friday and realized a nail has gone through her shoes and injured her foot right under the big toe. Due to neuropathy she never felt significantly pain initially. By Sunday she noticed swelling extending to her lower part of her foot, with associated redness and pain. There was slight serosanguineous discharge. Patient reported chill overnight. Denies CP, SOB, Abd. Pain, N/V, dysuria, hematuria or any other complaints.  In ED patient was found with low grade fever; swelling in her foot with redness, warmness and tenderness extending until ankle area; hyperglycemia of 447 and slight worsening in kidney function. [facility] called to admit patient for further evaluation and treatment.  Review of Systems:  Negative except as otherwise mentioned in HPI.  Past Medical History  Diagnosis Date  . Diabetes mellitus    Past Surgical History  Procedure Laterality Date  . Arm surgery     . Cesarean section     Social History:  reports that she has never smoked. She does not have any smokeless tobacco history on file. She reports that she does not drink alcohol or use illicit drugs.  Allergies  Allergen Reactions  . Tramadol Nausea And Vomiting    Family History  Problem Relation Age of Onset  . Diabetes Other     Prior to Admission medications   Medication Sig Start Date End Date Taking? Authorizing Provider  diphenhydrAMINE (BENADRYL) 25 MG tablet Take 50 mg by mouth at bedtime.    Yes Historical Provider, MD  insulin NPH-regular  (NOVOLIN 70/30) (70-30) 100 UNIT/ML injection Inject 15-20 Units into the skin 2 (two) times daily with a meal. Inject 20 units every morning and 15 units every night   Yes Historical Provider, MD  naproxen sodium (ANAPROX) 220 MG tablet Take 440 mg by mouth 2 (two) times daily as needed (pain). Aleve   Yes Historical Provider, MD  oxyCODONE-acetaminophen (PERCOCET/ROXICET) 5-325 MG per tablet Take 2 tablets by mouth once. Patient not taking: Reported on 05/06/2014 03/20/14   Rachel Shiobert L Beaton, MD   Physical Exam: Filed Vitals:   05/06/14 1845 05/06/14 1915 05/06/14 1930 05/06/14 1945  BP: 140/82 142/67 152/69 143/92  Pulse: 100 96 97 95  Temp:      TempSrc:      Resp: 23 17 13 18   Height:      Weight:      SpO2: 98% 98% 99% 98%    Wt Readings from Last 3 Encounters:  05/06/14 83.915 kg (185 lb)  03/20/14 83.915 kg (185 lb)  12/10/13 87.998 kg (194 lb)    General:  Appears calm; slightly warm to touch; no SOB. Complaining of right foot pain Eyes: PERRL, normal lids, irises & conjunctiva, no icterus, no nystagmus ENT: grossly normal hearing, lips & tongue; no erythema or exudates Neck: no LAD, masses or thyromegaly, no JVD Cardiovascular: RRR, no m/r/g. Bilateral LE edema (1-2++, with right foot/ankle are more swollen and red in comparison to left foot/leg). Respiratory: CTA bilaterally, no w/r/r. Normal respiratory effort. Abdomen: soft, nt, nd; positive BS Skin: red swollen plantar  area spreading into ankle and lower extremity on the right; was also warm and tender to touch Musculoskeletal: complaining of pain in her right shoulder, right elbow and bilat knees; limited range of motion (according to patient not new) Psychiatric: no SI, no hallucinations; slight flat affect Neurologic: grossly non-focal.          Labs on Admission:  Basic Metabolic Panel:  Recent Labs Lab 05/06/14 1328  NA 135  K 4.7  CL 101  CO2 27  GLUCOSE 447*  BUN 19  CREATININE 1.19*  CALCIUM 9.1    CBC:  Recent Labs Lab 05/06/14 1328  WBC 8.8  HGB 9.4*  HCT 29.3*  MCV 81.4  PLT 228   CBG:  Recent Labs Lab 05/06/14 1835  GLUCAP 259*    Radiological Exams on Admission: Dg Chest 2 View (if Patient Has Fever And/or Copd)  05/06/2014   CLINICAL DATA:  Stepped on a tack 2 days ago. Pain in the plantar surface of the right foot. Mid sternal chest pain and shortness of breath started today in church.  EXAM: CHEST  2 VIEW  COMPARISON:  02/26/2011  FINDINGS: The heart size and mediastinal contours are within normal limits. Both lungs are clear. The visualized skeletal structures are unremarkable.  IMPRESSION: No active cardiopulmonary disease.   Electronically Signed   By: Norva Pavlov M.D.   On: 05/06/2014 14:24   Dg Foot Complete Right  05/06/2014   CLINICAL DATA:  Stepped on a attack 2 days ago. Pain on plantar surface medial side of right foot.  EXAM: RIGHT FOOT COMPLETE - 3+ VIEW  COMPARISON:  None.  FINDINGS: Soft tissue swelling throughout the foot. No fracture, Old healed fracture within the midshaft of the right third proximal phalanx. No acute fracture, subluxation or dislocation. No radiopaque foreign body.  IMPRESSION: Diffuse soft tissue swelling. No acute bony abnormality or radiopaque foreign body.   Electronically Signed   By: Charlett Nose M.D.   On: 05/06/2014 14:25    EKG:  No acute ischemic changes appreciated; sinus rhythm   Assessment/Plan 1-Cellulitis of right lower extremity: in patient with uncontrolled diabetes after nail injury -rapidly spreading into foot and LE -will start broad spectrum antibiotics -elevate leg and supportive care -x-ray w/o foreign body; just soft tissue swelling -will give tetanus shot -follow clinical response -blood cx's taken -prn analgesics and antipyretics -normal lactic acid -will follow labs from cellulitis/diabetic foot ulcer protocol  2-essential HTN (hypertension): uncontrolled -in part secondary to pain -will  start bidil -patient renal function slightly elevated and unknown baseline -will follow BP and if renal improve will use ACE  2-uncontrolled diabetes type 2: with neuropathy -will check A1C -start SSI and lantus -will use neurontin for neuropathy  3-Anemia: most likely of chronic disease -will check anemia panel -FOBT neg  4-Obesity (BMI 30-39.9): low calorie diet and exercise discussed with patient -Body mass index is 33.28 kg/(m^2).  5-GERD: will continue PPI  6-AKI superimposed in CKD stage 3: baseline Cr 0.9-.1.1 (GFR 49-55) -presented with Cr 1.2 -will give IVF's -check UA -follow renal function -pharmacy to adjust meds for renal function    Code Status: Full DVT Prophylaxis: heparin  Family Communication: no family at bedside Disposition Plan: med-surg, inpatient; LOS > 2 midnights  Time spent: 60 minutes  Vassie Loll [facility] Pager [phone]

## 2014-05-06 NOTE — ED Notes (Signed)
attempted report 

## 2014-05-06 NOTE — ED Provider Notes (Signed)
CSN: [id number]     Arrival date & time 05/06/14  1252 History   First MD Initiated Contact with Patient 05/06/14 1605     Chief Complaint  Patient presents with  . Chest Pain  . Flank Pain  . Puncture Wound     (Consider location/radiation/quality/duration/timing/severity/associated sxs/prior Treatment) HPI    PCP: Pcp Not In System Blood pressure 181/82, pulse 101, temperature 98.6 F (37 C), temperature source Oral, resp. rate 20, height 5' 2.5" (1.588 m), weight 185 lb (83.915 kg), SpO2 100 %.  Rachel Gonzalez is a 64 y.o.female with a significant PMH of diabetes presents to the ER with complaints of body aches all over, hot flashes, chills, right foot pain. On Friday she noticed blood in her shoe and realized a tack had gone into her shoe and into her lower big toe sometime throughout the day. She did not have pain originally but has developed significant swelling and redness to the foot that has rapidly spread to mid calf. She denies that her chest pain, flank pain, left elbow pain, and knee pains are not unusual for her. She admits to chronic pain. Her  Right foot is what hurts the worst. Pt awake, alert, oriented and appears sick.  Negative Review of Symptoms: SOB, confusion, weakness, change in vision, loss of bowel or urine control, hematemesis, hematochezia.    Past Medical History  Diagnosis Date  . Diabetes mellitus    Past Surgical History  Procedure Laterality Date  . Arm surgery     . Cesarean section     Family History  Problem Relation Age of Onset  . Diabetes Other    History  Substance Use Topics  . Smoking status: Never Smoker   . Smokeless tobacco: Not on file  . Alcohol Use: No   OB History    No data available     Review of Systems  10 Systems reviewed and are negative for acute change except as noted in the HPI.     Allergies  Tramadol  Home Medications   Prior to Admission medications   Medication Sig Start Date End Date Taking?  Authorizing Provider  diphenhydrAMINE (BENADRYL) 25 MG tablet Take 50 mg by mouth at bedtime.    Yes Historical Provider, MD  insulin NPH-regular (NOVOLIN 70/30) (70-30) 100 UNIT/ML injection Inject 15-20 Units into the skin 2 (two) times daily with a meal. Inject 20 units every morning and 15 units every night   Yes Historical Provider, MD  naproxen sodium (ANAPROX) 220 MG tablet Take 440 mg by mouth 2 (two) times daily as needed (pain). Aleve   Yes Historical Provider, MD  oxyCODONE-acetaminophen (PERCOCET/ROXICET) 5-325 MG per tablet Take 2 tablets by mouth once. Patient not taking: Reported on 05/06/2014 03/20/14   Nelia Shi, MD   BP 158/75 mmHg  Pulse 94  Temp(Src) 99.4 F (37.4 C) (Rectal)  Resp 18  Ht 5' 2.5" (1.588 m)  Wt 185 lb (83.915 kg)  BMI 33.28 kg/m2  SpO2 99% Physical Exam  Constitutional: She appears well-developed and well-nourished.  HENT:  Head: Normocephalic and atraumatic.  Eyes: Pupils are equal, round, and reactive to light.  Neck: Normal range of motion. Neck supple.  Cardiovascular: Normal rate and regular rhythm.   Pulmonary/Chest: Effort normal. No respiratory distress. She has no wheezes.  Abdominal: Soft.  Musculoskeletal:       Right ankle: She exhibits decreased range of motion (due to pain) and swelling. She exhibits no ecchymosis, no deformity,  no laceration and normal pulse. Tenderness (diffuse). Achilles tendon normal.       Right lower leg: She exhibits swelling and edema. She exhibits no tenderness, no bony tenderness, no deformity and no laceration.  Pt has erythema to the right food extending up the mid calf, she has symmetrical swelling. There is a small wound to the bottom of her foot that does not have any crepitus or fluid drainage. There is surrounding erythema to the large toe anteriorly that extends to the mid foot.  Neurological: She is alert.  Skin: Skin is warm. She is diaphoretic (rigors).  Nursing note and vitals reviewed.   ED  Course  Procedures (including critical care time) Labs Review Labs Reviewed  CBC - Abnormal; Notable for the following:    RBC 3.60 (*)    Hemoglobin 9.4 (*)    HCT 29.3 (*)    All other components within normal limits  BASIC METABOLIC PANEL - Abnormal; Notable for the following:    Glucose, Bld 447 (*)    Creatinine, Ser 1.19 (*)    GFR calc non Af Amer 48 (*)    GFR calc Af Amer 56 (*)    All other components within normal limits  CULTURE, BLOOD (ROUTINE X 2)  CULTURE, BLOOD (ROUTINE X 2)  URINALYSIS, ROUTINE W REFLEX MICROSCOPIC  OCCULT BLOOD X 1 CARD TO LAB, STOOL  I-STAT TROPOININ, ED  I-STAT CG4 LACTIC ACID, ED  POC OCCULT BLOOD, ED  CBG MONITORING, ED    Imaging Review Dg Chest 2 View (if Patient Has Fever And/or Copd)  05/06/2014   CLINICAL DATA:  Stepped on a tack 2 days ago. Pain in the plantar surface of the right foot. Mid sternal chest pain and shortness of breath started today in church.  EXAM: CHEST  2 VIEW  COMPARISON:  02/26/2011  FINDINGS: The heart size and mediastinal contours are within normal limits. Both lungs are clear. The visualized skeletal structures are unremarkable.  IMPRESSION: No active cardiopulmonary disease.   Electronically Signed   By: Norva Pavlov M.D.   On: 05/06/2014 14:24   Dg Foot Complete Right  05/06/2014   CLINICAL DATA:  Stepped on a attack 2 days ago. Pain on plantar surface medial side of right foot.  EXAM: RIGHT FOOT COMPLETE - 3+ VIEW  COMPARISON:  None.  FINDINGS: Soft tissue swelling throughout the foot. No fracture, Old healed fracture within the midshaft of the right third proximal phalanx. No acute fracture, subluxation or dislocation. No radiopaque foreign body.  IMPRESSION: Diffuse soft tissue swelling. No acute bony abnormality or radiopaque foreign body.   Electronically Signed   By: Charlett Nose M.D.   On: 05/06/2014 14:25     EKG Interpretation None      MDM   Final diagnoses:  Cellulitis of right lower  extremity  Secondary hypertension, unspecified  Hyperglycemia  Anemia, unspecified anemia type  Right foot pain   Patient appears to be in early sepsis with a non elevated WBC. She is hypertensive (181/82) and tachycardic (101). She is exhibiting rigors. She has dropped 1.7 points of hemoglobin in 6 weeks. Pt admits to dark colored stool at home that has since resolved. Her glucose is 447; she reports not eating and not taking her insulins.   Troponin is negative, xray of the foot shoes no retained foreign body, chest xray is negative.  5:03 pm Will check rectal temp, hemoccult, urine and lactic acid. Fluids, nausea and pain medications initiated.  Pt  is an uncontrolled diabetic with rapidly spreading cellulitis to the right foot. No ascertainable abscess and at this time not having symptoms of tenosynovitis. Xray of foot does not show gas spreading up the leg.  I spoke with Dr. Gwenlyn PerkingMadera with [facility] who has agreed to admit as an unassigned patient. Inpatient, med-surg, [facility]-admits.  Filed Vitals:   05/06/14 1816  BP: 158/75  Pulse:   Temp:   Resp:      Dorthula Matasiffany G Arbor Cohen, PA-C 05/06/14 1827  Lyanne CoKevin M Campos, MD 05/06/14 [phone]

## 2014-05-06 NOTE — Progress Notes (Signed)
05/06/14 Call placed to Emergency Room to get report, they will call back. Patient has Dx of Cellulitis, IV site 2/28 Right A/C,On IV Abt Zosyn,Cleocin,and Vancomycin.Routine vitrals,Will be on [employer],Room Air.

## 2014-05-06 NOTE — Progress Notes (Signed)
ANTIBIOTIC CONSULT NOTE - INITIAL  Pharmacy Consult for Vancomycin + Zosyn Indication: Diabetic foot infection  Allergies  Allergen Reactions  . Tramadol Nausea And Vomiting    Patient Measurements: Height: 5' 2.5" (158.8 cm) Weight: 185 lb (83.915 kg) IBW/kg (Calculated) : 51.25  Vital Signs: Temp: 99.4 F (37.4 C) (02/28 1739) Temp Source: Rectal (02/28 1739) BP: 143/92 mmHg (02/28 1945) Pulse Rate: 95 (02/28 1945) Intake/Output from previous day:   Intake/Output from this shift:    Labs:  Recent Labs  05/06/14 1328  WBC 8.8  HGB 9.4*  PLT 228  CREATININE 1.19*   Estimated Creatinine Clearance: 49.8 mL/min (by C-G formula based on Cr of 1.19). No results for input(s): VANCOTROUGH, VANCOPEAK, VANCORANDOM, GENTTROUGH, GENTPEAK, GENTRANDOM, TOBRATROUGH, TOBRAPEAK, TOBRARND, AMIKACINPEAK, AMIKACINTROU, AMIKACIN in the last 72 hours.   Microbiology: No results found for this or any previous visit (from the past 720 hour(s)).  Medical History: Past Medical History  Diagnosis Date  . Diabetes mellitus     Assessment: [age] YOF with history of DM [facility] with body aches, chills, and R foot pain after stepping on a tack a couple of days ago. The patient is noted to have significant swelling and redness and pharmacy was consulted to start Vancomycin + Zosyn for cellulitis and diabetic foot infection.  The patient received a dose of Zosyn 3.375g @ 1730 and Vancomycin 1g @ 1817 earlier today in the [facility]  Goal of Therapy:  Vancomycin trough level 10-15 mcg/ml  Proper antibiotics for infection/cultures adjusted for renal/hepatic function   Plan:  1. Vancomycin 1500 mg IV every 24 hours (starting at 1200 on 2/29) 2. Zosyn 3.375g IV very 8 hours (infused over 4 hours) 3. Will continue to follow renal function, culture results, LOT, and antibiotic de-escalation plans   Georgina PillionElizabeth Asencion Guisinger, PharmD, BCPS Clinical Pharmacist Pager:  [phone] 05/06/2014 8:58 PM

## 2014-05-06 NOTE — ED Notes (Signed)
Pt c/o left side pain x 1 month, puncture wound to bottom of right foot by stepping on a nail on Friday and chest pain onset today while at church.

## 2014-05-07 ENCOUNTER — Encounter (HOSPITAL_COMMUNITY): Payer: Self-pay | Admitting: *Deleted

## 2014-05-07 ENCOUNTER — Inpatient Hospital Stay (HOSPITAL_COMMUNITY): Payer: 59

## 2014-05-07 DIAGNOSIS — D6489 Other specified anemias: Secondary | ICD-10-CM

## 2014-05-07 LAB — BASIC METABOLIC PANEL
Anion gap: 6 (ref 5–15)
BUN: 21 mg/dL (ref 6–23)
CALCIUM: 8.5 mg/dL (ref 8.4–10.5)
CO2: 27 mmol/L (ref 19–32)
Chloride: 103 mmol/L (ref 96–112)
Creatinine, Ser: 1.69 mg/dL — ABNORMAL HIGH (ref 0.50–1.10)
GFR calc Af Amer: 36 mL/min — ABNORMAL LOW (ref >90)
GFR, EST NON AFRICAN AMERICAN: 31 mL/min — AB (ref >90)
Glucose, Bld: 333 mg/dL — ABNORMAL HIGH (ref 70–99)
Potassium: 4.6 mmol/L (ref 3.5–5.1)
SODIUM: 136 mmol/L (ref 135–145)

## 2014-05-07 LAB — IRON AND TIBC: UIBC: 205 ug/dL (ref 125–400)

## 2014-05-07 LAB — LIPID PANEL
CHOLESTEROL: 140 mg/dL (ref 0–200)
HDL: 61 mg/dL (ref >39)
LDL Cholesterol: 68 mg/dL (ref 0–99)
Total CHOL/HDL Ratio: 2.3 RATIO
Triglycerides: 54 mg/dL (ref <150)
VLDL: 11 mg/dL (ref 0–40)

## 2014-05-07 LAB — CBC
HCT: 24.7 % — ABNORMAL LOW (ref 36.0–46.0)
HEMOGLOBIN: 7.9 g/dL — AB (ref 12.0–15.0)
MCH: 26 pg (ref 26.0–34.0)
MCHC: 32 g/dL (ref 30.0–36.0)
MCV: 81.3 fL (ref 78.0–100.0)
PLATELETS: 217 10*3/uL (ref 150–400)
RBC: 3.04 MIL/uL — ABNORMAL LOW (ref 3.87–5.11)
RDW: 13.7 % (ref 11.5–15.5)
WBC: 8.5 10*3/uL (ref 4.0–10.5)

## 2014-05-07 LAB — C-REACTIVE PROTEIN: CRP: 16.9 mg/dL — ABNORMAL HIGH (ref <0.60)

## 2014-05-07 LAB — URINALYSIS, ROUTINE W REFLEX MICROSCOPIC
BILIRUBIN URINE: NEGATIVE
Glucose, UA: >1000 mg/dL — AB
Hgb urine dipstick: NEGATIVE
Ketones, ur: NEGATIVE mg/dL
NITRITE: NEGATIVE
Protein, ur: 30 mg/dL — AB
Specific Gravity, Urine: 1.024 (ref 1.005–1.030)
UROBILINOGEN UA: 1 mg/dL (ref 0.0–1.0)
pH: 5 (ref 5.0–8.0)

## 2014-05-07 LAB — HIV ANTIBODY (ROUTINE TESTING W REFLEX): HIV Screen 4th Generation wRfx: NONREACTIVE

## 2014-05-07 LAB — GLUCOSE, CAPILLARY
Glucose-Capillary: 290 mg/dL — ABNORMAL HIGH (ref 70–99)
Glucose-Capillary: 148 mg/dL — ABNORMAL HIGH (ref 70–99)
Glucose-Capillary: 280 mg/dL — ABNORMAL HIGH (ref 70–99)

## 2014-05-07 LAB — URINE MICROSCOPIC-ADD ON

## 2014-05-07 LAB — FERRITIN: Ferritin: 154 ng/mL (ref 10–291)

## 2014-05-07 LAB — FOLATE: Folate: 17.4 ng/mL

## 2014-05-07 LAB — VITAMIN B12: Vitamin B-12: 384 pg/mL (ref 211–911)

## 2014-05-07 LAB — PREALBUMIN: Prealbumin: 11.4 mg/dL — ABNORMAL LOW (ref 17.0–34.0)

## 2014-05-07 MED ORDER — SODIUM CHLORIDE 0.9 % IV BOLUS (SEPSIS)
500.0000 mL | Freq: Once | INTRAVENOUS | Status: AC
  Administered 2014-05-07: 500 mL via INTRAVENOUS

## 2014-05-07 MED ORDER — ACETAMINOPHEN 325 MG PO TABS
325.0000 mg | ORAL_TABLET | Freq: Once | ORAL | Status: AC
  Administered 2014-05-07: 325 mg via ORAL
  Filled 2014-05-07: qty 1

## 2014-05-07 MED ORDER — LIVING WELL WITH DIABETES BOOK
Freq: Once | Status: AC
  Administered 2014-05-07: 17:00:00
  Filled 2014-05-07: qty 1

## 2014-05-07 MED ORDER — INSULIN DETEMIR 100 UNIT/ML ~~LOC~~ SOLN
18.0000 [IU] | Freq: Every day | SUBCUTANEOUS | Status: DC
  Administered 2014-05-07: 18 [IU] via SUBCUTANEOUS
  Filled 2014-05-07 (×2): qty 0.18

## 2014-05-07 NOTE — Progress Notes (Signed)
PROGRESS NOTE  JAKI STEPTOE ZOX:[id number] DOB: 12-Jul-1950 DOA: 05/06/2014 PCP: Pcp Not In System  HPI/Subjective: Rachel Gonzalez is a 64 year old female with a past medical history of diabetes type 2, HTN, and obesity who presented to the [facility] ED on 05/06/14 with right foot pain/ swelling. Due to diabetic neuropathy the patient is unsure of the exact time of injury but realized the injury occurred on Friday when she discovered blood on her sock and a nail in her shoe. States that by 0 mg 100 mL/hr over 30 Minutes Intravenous  Once 05/06/14 1825 05/06/14 2032   05/06/14 1715  vancomycin (VANCOCIN) IVPB 1000 mg/200 mL premix     1,000 mg 200 mL/hr over 60 Minutes Intravenous  Once 05/06/14 1708 05/06/14 1917   05/06/14 1715  piperacillin-tazobactam (ZOSYN) IVPB 3.375 g     3.375 g 12.5 mL/hr over 240 Minutes Intravenous  Once 05/06/14 1708 05/06/14 2127      Objective: Filed Vitals:   05/06/14 2230 05/06/14 2308 05/07/14 0639 05/07/14 1019  BP:  125/62 129/85 128/56  Pulse:   92  Temp: 99.6 F (37.6 C)  100.4 F (38 C)   TempSrc:   Oral   Resp:   20   Height:      Weight:      SpO2:   100%     Intake/Output Summary (Last 24 hours) at 05/07/14 1024 Last data filed at 05/07/14 0700  Gross per 24 hour  Intake 1296.67 ml  Output      0 ml  Net 1296.67 ml   Filed Weights   05/06/14 1301  Weight: 83.915 kg (185 lb)    Exam: General: Well developed, well nourished, NAD, appears stated age  HEENT:  PERR, EOMI, Anicteic Sclera, MMM. No pharyngeal erythema or  exudates  Neck: Supple, no JVD, no masses  Cardiovascular: RRR, S1 S2 auscultated, no rubs, murmurs or gallops.   Respiratory: Clear to auscultation bilaterally with equal chest rise  Abdomen: Soft, nontender, nondistended, + bowel sounds  Extremities: Left is warm dry without cyanosis clubbing or edema. Right foot is edematous with mild erythremia, wound at the base of the great toe.  Neuro: AAOx3, cranial nerves grossly intact. Strength 5/5 in upper and lower extremities  Skin: Without rashes exudates or nodules.   Psych: Normal affect and demeanor with intact judgement and insight   Data Reviewed: Basic Metabolic Panel:  Recent Labs Lab 05/06/14 1328 05/06/14 2040 05/07/14 0528  NA 135  --  136  K 4.7  --  4.6  CL 101  --  103  CO2 27  --  27  GLUCOSE 447*  --  333*  BUN 19  --  21  CREATININE 1.19*  --  1.69*  CALCIUM 9.1  --  8.5  MG  --  1.9  --   PHOS  --  2.5  --    Liver Function Tests:  Recent Labs Lab 05/06/14 2040  AST 13  ALT 13  ALKPHOS 122*  BILITOT 0.6  PROT 6.6  ALBUMIN 2.9*   CBC:  Recent Labs Lab 05/06/14 1328 05/07/14 0528  WBC 8.8 8.5  HGB 9.4* 7.9*  HCT 29.3* 24.7*  MCV 81.4 81.3  PLT 228 217   CBG:  Recent Labs Lab 05/06/14 1835 05/06/14 2111 05/06/14 2235 05/07/14 0825  GLUCAP 259* 207* 189* 290*    Studies: Dg Chest 2 View (if Patient Has Fever And/or Copd)  05/06/2014   CLINICAL DATA:  Stepped on a tack 2 days ago. Pain in the plantar surface of the right foot. Mid sternal chest pain and shortness of breath started today in church.  EXAM: CHEST  2 VIEW  COMPARISON:  02/26/2011  FINDINGS: The heart size and mediastinal contours are within normal limits. Both lungs are clear. The visualized skeletal structures are unremarkable.  IMPRESSION: No active cardiopulmonary disease.   Electronically Signed   By: Norva Pavlov M.D.   On: 05/06/2014 14:24   Dg Foot Complete Right  05/06/2014   CLINICAL DATA:  Stepped on a attack 2  days ago. Pain on plantar surface medial side of right foot.  EXAM: RIGHT FOOT COMPLETE - 3+ VIEW  COMPARISON:  None.  FINDINGS: Soft tissue swelling throughout the foot. No fracture, Old healed fracture within the midshaft of the right third proximal phalanx. No acute fracture, subluxation or dislocation. No radiopaque foreign body.  IMPRESSION: Diffuse soft tissue swelling. No acute bony abnormality or radiopaque foreign body.   Electronically Signed   By: Charlett Nose M.D.   On: 05/06/2014 14:25    Scheduled  Meds: . gabapentin  100 mg Oral TID  . heparin  5,000 Units Subcutaneous 3 times per day  . insulin aspart  0-15 Units Subcutaneous TID WC  . insulin detemir  15 Units Subcutaneous QHS  . isosorbide-hydrALAZINE  1 tablet Oral BID  . pantoprazole  40 mg Oral Q1200  . piperacillin-tazobactam (ZOSYN)  IV  3.375 g Intravenous Q8H  . vancomycin  1,500 mg Intravenous Q24H   Continuous Infusions: . sodium chloride 100 mL/hr at 05/07/14 0906    Principal Problem:   Cellulitis of right lower extremity Active Problems:   HTN (hypertension)   Cellulitis   Uncontrolled diabetes mellitus   Anemia   Diabetic neuropathy, type II diabetes mellitus   Obesity (BMI 30-39.9)   Cellulitis of right foot  Raspect, Erin, PA-S [facility] 05/07/2014, 10:24 AM   Attending Patient was seen, examined,treatment plan was discussed with the  Advance Practice Provider.  I have directly reviewed the clinical findings, lab, imaging studies and management of this patient in detail. I have made the necessary changes to the above noted documentation, and agree with the documentation, as recorded by the Advance Practice Provider.   Admit with a diabetic foot infection following stepping on a nail, improved following initiation of vancomycin and Zosyn. X-rays negative for osteomyelitis. Is significantly anemic, suspect from acute illness on top of chronic anemia-has no overt blood loss. Mildly worsened  renal function today. Will repeat CBC and chemistries-patient seems to have improved clinically compared to on admission.  Windell NorfolkS Floyd Lusignan MD [facility].

## 2014-05-07 NOTE — Progress Notes (Signed)
INITIAL NUTRITION ASSESSMENT  DOCUMENTATION CODES Per approved criteria  -Morbid Obesity   INTERVENTION: Provide Prostat BID upon diet advancement, provides 100 kcal, 15 grams of protein  NUTRITION DIAGNOSIS: Increased nutrient needs related to wound healing as evidenced by estimated energy requirements.   Goal: Pt to meet </= 90% of estimated energy requirements  Monitor:  PO intake, weight, labs  Reason for Assessment: Consult for wound healing  64 y.o. 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 Pt was presented to ED complaining of right foot pain and swelling. She had realized a nail had gone through her shoes and injured her foot under big toe. In ED, pt was found with low grade fever, swelling in her foot with redness, warmness and tenderness extending until ankle area.   Labs- low Ca, Na; high glucose, BUN, creatinine Pt denied any significant wt changes or decreased appetite pta. Her current PO intake is 50-75%. She reported not feeling hungry recently due to pain. Offered to provide nutrition education for DM2, but pt felt comfortable with the diet already.  Discussed providing Prostat BID to help with wound healing, and pt was interesting in trying upon diet advancement.  NFPE did not indicate any subcutaneous body fat or muscle mass depletion.  Height: Ht Readings from Last 1 Encounters:  05/06/14 5' 2.5" (1.588 m)    Weight: Wt Readings from Last 1 Encounters:  05/06/14 185 lb (83.915 kg)    Ideal Body Weight: 110 lb (50 kg)  % Ideal Body Weight: 167%  Wt Readings from Last 10 Encounters:  05/06/14 185 lb (83.915 kg)  03/20/14 185 lb (83.915 kg)  12/10/13 194 lb (87.998 kg)  09/24/13 189 lb (85.73 kg)  04/16/13 196 lb (88.905 kg)  02/20/13 196 lb 12.8 oz (89.268 kg)  06/17/11 196 lb 11.2 oz (89.223 kg)   03/31/11 203 lb 3.2 oz (92.171 kg)  03/05/11 190 lb 4.8 oz (86.32 kg)  10/23/09 188 lb 12.8 oz (85.639 kg)    Usual Body Weight: unknown  % Usual Body Weight: -  BMI:  Body mass index is 33.28 kg/(m^2). obesity class I  Estimated Nutritional Needs: Kcal: 1650-1800 Protein: 85-100 grams Fluid: >/= 1.8L daily  Skin: cellulitis right L/E  Diet Order: Diet NPO time specified  EDUCATION NEEDS: -No education needs identified at this time   Intake/Output Summary (Last 24 hours) at 05/08/14 1035 Last data filed at 05/08/14 0900  Gross per 24 hour  Intake 2936.67 ml  Output    550 ml  Net 2386.67 ml    Last BM: PTA   Labs:   Recent Labs Lab 05/06/14 1328 05/06/14 2040 05/07/14 0528 05/08/14 0607  NA 135  --  136 134*  K 4.7  --  4.6 5.1  CL 101  --  103 99  CO2 27  --  27 22  BUN 19  --  21 30*  CREATININE 1.19*  --  1.69* 2.23*  CALCIUM 9.1  --  8.5 8.3*  MG  --  1.9  --   --   PHOS  --  2.5  --   --   GLUCOSE 447*  --  333* 379*    CBG (last 3)   Recent Labs  05/07/14 2138 05/08/14 0359 05/08/14 0752  GLUCAP 280* 332* 368*    Scheduled Meds: . gabapentin  100 mg  Oral TID  . heparin  5,000 Units Subcutaneous 3 times per day  . insulin aspart  0-20 Units Subcutaneous TID WC  . insulin aspart  6 Units Subcutaneous TID WC  . insulin detemir  18 Units Subcutaneous QHS  . isosorbide-hydrALAZINE  1 tablet Oral BID  . pantoprazole  40 mg Oral Q1200  . piperacillin-tazobactam (ZOSYN)  IV  3.375 g Intravenous Q8H  . vancomycin  1,500 mg Intravenous Q24H    Continuous Infusions: . sodium chloride 125 mL/hr at 05/08/14 7829    Past Medical History  Diagnosis Date  . Diabetes mellitus     Past Surgical History  Procedure Laterality Date  . Arm surgery     . Cesarean section      Cristela Felt, MS Dietetic Intern Pager: [phone]

## 2014-05-07 NOTE — Progress Notes (Signed)
Rachel Gonzalez Luis [id number] Code Status: Full    Admission Data: 05/06/2014 08:30 Attending Provider:  Vassie LollMadera, Carlos PCP:Pcp Not In System Consults/ Treatment Team:    Rachel Gonzalez Dieujuste is Gonzalez 64 y.o. female patient admitted from ED awake, alert - oriented  X 3 - no acute distress noted.  VSS - Blood pressure 125/62, pulse 95, temperature 99.6 F (37.6 C), temperature source Rectal, resp. rate 20, height 5' 2.5" (1.588 m), weight 83.915 kg (185 lb), SpO2 100 %.    IV in place, occlusive dsg intact without redness.  Orientation to room, and floor completed with information packet given to patient/family.  Patient declined safety video at this time.  Admission INP armband ID verified with patient/family, and in place.   SR up x 2, fall assessment complete, with patient and family able to verbalize understanding of risk associated with falls, and verbalized understanding to call nsg before up out of bed.  Call light within reach, patient able to voice, and demonstrate understanding.  Skin, clean-dry- intact without evidence of bruising, or skin tears.   No evidence of skin break down noted on exam.     Will cont to eval and treat per MD orders.  Sharilyn SitesJeter, Eastyn Dattilo M, RN 05/06/2014 08:30PM

## 2014-05-07 NOTE — Consult Note (Addendum)
WOC wound consult note Reason for Consult: Consult requested for right foot wound.  Pt reports she stepped on a nail prior to admission. Wound type: Full thickness to right plantar great toe.  X-ray does not show abscess or osteomyelitis. Generalized edema and erythremia to right anterior foot. Measurement:.2X.2X.2cm Wound bed: red and moist Drainage (amount, consistency, odor) Mod amt pink drainage, no odor Periwound: Intact skin surrounding Dressing procedure/placement/frequency: Alginate dressing to absorb drainage.  Foam dressing to protect from further injury. Pt is currently on antibiotics for soft tissue infection. Discussed plan of care and she denies further questions. Please re-consult if further assistance is needed.  Thank-you,  Cammie Mcgeeawn Jossalin Chervenak MSN, RN, CWOCN, [place]CN-AP, CNS [phone]

## 2014-05-07 NOTE — Progress Notes (Signed)
Spoke with Dr. Claiborne Billingsallahan, made him aware of pt temp of 100.4, gave telephone orders with readback to give tylenol. Will make Day rn aware. Will continue to monitor.

## 2014-05-07 NOTE — Progress Notes (Signed)
Inpatient Diabetes Program Recommendations  AACE/ADA: New Consensus Statement on Inpatient Glycemic Control (2013)  Target Ranges:  Prepandial:   less than 140 mg/dL      Peak postprandial:   less than 180 mg/dL (1-2 hours)      Critically ill patients:  140 - 180 mg/dL   Reason for Visit: Diabetes Consult  Diabetes history: DM2 Outpatient Diabetes medications: NPH 70/30 20 units in am and 15 units in pm  Current orders for Inpatient glycemic control: Levemir 18 units QHS, Novolog moderate tidwc HgbA1C - pending  Pt states she hasn't been taking her pm dose of 70/30 15 units d/t hypoglycemia during the night. Was taking the insulin right before bedtime instead of before dinner meal. Has not been checking blood sugars d/t broken meter. "I need to start taking care of myself." [facility] years ago. Would like to go again. Results for [name], Rachel A (MRN [id number]) as of 05/07/2014 15:15  Ref. Range 05/06/2014 18:35 05/06/2014 21:11 05/06/2014 22:35 05/07/2014 08:25  Glucose-Capillary Latest Range: 70-99 mg/dL 409259 (H) 811207 (H) 914189 (H) 290 (H)   Results for [name], Rachel A (MRN [id number]) as of 05/07/2014 15:15  Ref. Range 05/06/2014 13:28 05/07/2014 05:28  Glucose Latest Range: 70-99 mg/dL 086447 (H) 578333 (H)    Uncontrolled blood sugars. Long discussion with pt regarding her diabetes. Stressed importance of checking blood sugars and f/u with PCP with blood sugar log.  Recommendations: Increase Levemir to 30 units QHS Add Novolog 6 units tidwc for meal coverage insulin if pt eats >50% meal Awaiting HgbA1C results. Will need prescription for meter and strips at discharge. Will order Living Well With Diabetes book and OP Diabetes Education consult. Encouraged pt to view diabetes videos on pt ed channel. Will need to switch back to Novolin 70/30 at discharge d/t affordability.  Thank you. Ailene Ardshonda Murielle Stang, RD, LDN, CDE Inpatient Diabetes  Coordinator [phone]

## 2014-05-07 NOTE — Progress Notes (Signed)
Pt temp 100.4, given cold compress. Paged MD, awaiting callback. Will make day RN aware. Will continue to monitor.

## 2014-05-08 ENCOUNTER — Other Ambulatory Visit (HOSPITAL_COMMUNITY): Payer: Self-pay | Admitting: Orthopedic Surgery

## 2014-05-08 ENCOUNTER — Inpatient Hospital Stay (HOSPITAL_COMMUNITY): Payer: 59 | Admitting: Anesthesiology

## 2014-05-08 ENCOUNTER — Encounter (HOSPITAL_COMMUNITY)
Admission: EM | Disposition: A | Discharge status: Skilled Nursing Facility | Payer: Self-pay | Source: Home / Self Care | Attending: Internal Medicine

## 2014-05-08 ENCOUNTER — Encounter (HOSPITAL_COMMUNITY): Payer: Self-pay | Admitting: Certified Registered Nurse Anesthetist

## 2014-05-08 DIAGNOSIS — E1169 Type 2 diabetes mellitus with other specified complication: Secondary | ICD-10-CM

## 2014-05-08 DIAGNOSIS — A419 Sepsis, unspecified organism: Principal | ICD-10-CM

## 2014-05-08 DIAGNOSIS — L089 Local infection of the skin and subcutaneous tissue, unspecified: Secondary | ICD-10-CM

## 2014-05-08 HISTORY — PX: AMPUTATION: SHX166

## 2014-05-08 HISTORY — PX: I & D EXTREMITY: SHX5045

## 2014-05-08 LAB — CBC
HCT: 24.2 % — ABNORMAL LOW (ref 36.0–46.0)
HEMOGLOBIN: 7.8 g/dL — AB (ref 12.0–15.0)
MCH: 26.7 pg (ref 26.0–34.0)
MCHC: 32.2 g/dL (ref 30.0–36.0)
MCV: 82.9 fL (ref 78.0–100.0)
Platelets: 210 10*3/uL (ref 150–400)
RBC: 2.92 MIL/uL — ABNORMAL LOW (ref 3.87–5.11)
RDW: 13.7 % (ref 11.5–15.5)
WBC: 9.3 10*3/uL (ref 4.0–10.5)

## 2014-05-08 LAB — HEMOGLOBIN A1C
Hgb A1c MFr Bld: 16.2 % — ABNORMAL HIGH (ref 4.8–5.6)
Mean Plasma Glucose: 418 mg/dL

## 2014-05-08 LAB — BASIC METABOLIC PANEL
ANION GAP: 13 (ref 5–15)
BUN: 30 mg/dL — AB (ref 6–23)
CHLORIDE: 99 mmol/L (ref 96–112)
CO2: 22 mmol/L (ref 19–32)
CREATININE: 2.23 mg/dL — AB (ref 0.50–1.10)
Calcium: 8.3 mg/dL — ABNORMAL LOW (ref 8.4–10.5)
GFR calc non Af Amer: 22 mL/min — ABNORMAL LOW (ref >90)
GFR, EST AFRICAN AMERICAN: 26 mL/min — AB (ref >90)
Glucose, Bld: 379 mg/dL — ABNORMAL HIGH (ref 70–99)
POTASSIUM: 5.1 mmol/L (ref 3.5–5.1)
Sodium: 134 mmol/L — ABNORMAL LOW (ref 135–145)

## 2014-05-08 LAB — GLUCOSE, CAPILLARY
GLUCOSE-CAPILLARY: 368 mg/dL — AB (ref 70–99)
GLUCOSE-CAPILLARY: 273 mg/dL — AB (ref 70–99)
Glucose-Capillary: 258 mg/dL — ABNORMAL HIGH (ref 70–99)
Glucose-Capillary: 302 mg/dL — ABNORMAL HIGH (ref 70–99)
Glucose-Capillary: 374 mg/dL — ABNORMAL HIGH (ref 70–99)

## 2014-05-08 LAB — ABO/RH: ABO/RH(D): O NEG

## 2014-05-08 SURGERY — IRRIGATION AND DEBRIDEMENT EXTREMITY
Anesthesia: General | Site: Foot | Laterality: Right

## 2014-05-08 MED ORDER — HYDROMORPHONE HCL 1 MG/ML IJ SOLN: 0.5000 mg | INTRAMUSCULAR | Status: DC | PRN

## 2014-05-08 MED ORDER — INSULIN DETEMIR 100 UNIT/ML ~~LOC~~ SOLN
24.0000 [IU] | Freq: Every day | SUBCUTANEOUS | Status: DC
  Administered 2014-05-08: 24 [IU] via SUBCUTANEOUS
  Filled 2014-05-08 (×2): qty 0.24

## 2014-05-08 MED ORDER — MIDAZOLAM HCL 2 MG/2ML IJ SOLN
INTRAMUSCULAR | Status: AC
  Filled 2014-05-08: qty 2

## 2014-05-08 MED ORDER — SENNOSIDES-DOCUSATE SODIUM 8.6-50 MG PO TABS: 1.0000 | ORAL_TABLET | Freq: Every evening | ORAL | Status: DC | PRN

## 2014-05-08 MED ORDER — EPHEDRINE SULFATE 50 MG/ML IJ SOLN
INTRAMUSCULAR | Status: DC | PRN
  Administered 2014-05-08: 10 mg via INTRAVENOUS
  Administered 2014-05-08: 15 mg via INTRAVENOUS

## 2014-05-08 MED ORDER — BISACODYL 5 MG PO TBEC
5.0000 mg | DELAYED_RELEASE_TABLET | Freq: Every day | ORAL | Status: DC | PRN
  Administered 2014-05-09 – 2014-05-15 (×2): 5 mg via ORAL
  Filled 2014-05-08 (×2): qty 1

## 2014-05-08 MED ORDER — LIDOCAINE HCL (CARDIAC) 20 MG/ML IV SOLN
INTRAVENOUS | Status: DC | PRN
  Administered 2014-05-08: 100 mg via INTRAVENOUS

## 2014-05-08 MED ORDER — METHOCARBAMOL 1000 MG/10ML IJ SOLN
500.0000 mg | Freq: Four times a day (QID) | INTRAVENOUS | Status: DC | PRN
  Filled 2014-05-08: qty 5

## 2014-05-08 MED ORDER — METHOCARBAMOL 500 MG PO TABS
500.0000 mg | ORAL_TABLET | Freq: Four times a day (QID) | ORAL | Status: DC | PRN
  Filled 2014-05-08 (×2): qty 1

## 2014-05-08 MED ORDER — FENTANYL CITRATE 0.05 MG/ML IJ SOLN
INTRAMUSCULAR | Status: DC | PRN
  Administered 2014-05-08 (×2): 50 ug via INTRAVENOUS

## 2014-05-08 MED ORDER — PRO-STAT SUGAR FREE PO LIQD
30.0000 mL | Freq: Two times a day (BID) | ORAL | Status: DC
  Administered 2014-05-08 – 2014-05-15 (×10): 30 mL via ORAL
  Filled 2014-05-08 (×17): qty 30

## 2014-05-08 MED ORDER — INSULIN ASPART 100 UNIT/ML ~~LOC~~ SOLN
SUBCUTANEOUS | Status: AC
  Filled 2014-05-08: qty 1

## 2014-05-08 MED ORDER — ONDANSETRON HCL 4 MG/2ML IJ SOLN
4.0000 mg | Freq: Four times a day (QID) | INTRAMUSCULAR | Status: DC | PRN
  Administered 2014-05-10: 4 mg via INTRAVENOUS
  Filled 2014-05-08: qty 2

## 2014-05-08 MED ORDER — INSULIN ASPART 100 UNIT/ML ~~LOC~~ SOLN
0.0000 [IU] | Freq: Three times a day (TID) | SUBCUTANEOUS | Status: DC
  Administered 2014-05-09: 15 [IU] via SUBCUTANEOUS
  Administered 2014-05-09: 20 [IU] via SUBCUTANEOUS
  Administered 2014-05-09 – 2014-05-12 (×5): 3 [IU] via SUBCUTANEOUS
  Administered 2014-05-13: 4 [IU] via SUBCUTANEOUS
  Administered 2014-05-13: 3 [IU] via SUBCUTANEOUS
  Administered 2014-05-14: 4 [IU] via SUBCUTANEOUS
  Administered 2014-05-15: 7 [IU] via SUBCUTANEOUS
  Administered 2014-05-16: 3 [IU] via SUBCUTANEOUS

## 2014-05-08 MED ORDER — PHENYLEPHRINE 40 MCG/ML (10ML) SYRINGE FOR IV PUSH (FOR BLOOD PRESSURE SUPPORT)
PREFILLED_SYRINGE | INTRAVENOUS | Status: AC
  Filled 2014-05-08: qty 10

## 2014-05-08 MED ORDER — ONDANSETRON HCL 4 MG/2ML IJ SOLN
INTRAMUSCULAR | Status: DC | PRN
  Administered 2014-05-08: 4 mg via INTRAVENOUS

## 2014-05-08 MED ORDER — PROPOFOL 10 MG/ML IV BOLUS
INTRAVENOUS | Status: DC | PRN
  Administered 2014-05-08: 200 mg via INTRAVENOUS

## 2014-05-08 MED ORDER — ROCURONIUM BROMIDE 50 MG/5ML IV SOLN
INTRAVENOUS | Status: AC
  Filled 2014-05-08: qty 1

## 2014-05-08 MED ORDER — VANCOMYCIN HCL IN DEXTROSE 750-5 MG/150ML-% IV SOLN
750.0000 mg | INTRAVENOUS | Status: DC
  Filled 2014-05-08: qty 150

## 2014-05-08 MED ORDER — METOCLOPRAMIDE HCL 10 MG PO TABS: 5.0000 mg | ORAL_TABLET | Freq: Three times a day (TID) | ORAL | Status: DC | PRN

## 2014-05-08 MED ORDER — METOCLOPRAMIDE HCL 5 MG/ML IJ SOLN
5.0000 mg | Freq: Three times a day (TID) | INTRAMUSCULAR | Status: DC | PRN
  Filled 2014-05-08: qty 2

## 2014-05-08 MED ORDER — PROPOFOL 10 MG/ML IV BOLUS
INTRAVENOUS | Status: AC
  Filled 2014-05-08: qty 20

## 2014-05-08 MED ORDER — LIDOCAINE HCL (CARDIAC) 20 MG/ML IV SOLN
INTRAVENOUS | Status: AC
Start: 2014-05-08 — End: 2014-05-08
  Filled 2014-05-08: qty 5

## 2014-05-08 MED ORDER — FENTANYL CITRATE 0.05 MG/ML IJ SOLN
INTRAMUSCULAR | Status: AC
  Filled 2014-05-08: qty 5

## 2014-05-08 MED ORDER — PHENYLEPHRINE HCL 10 MG/ML IJ SOLN
INTRAMUSCULAR | Status: DC | PRN
  Administered 2014-05-08: 80 ug via INTRAVENOUS
  Administered 2014-05-08: 120 ug via INTRAVENOUS

## 2014-05-08 MED ORDER — SODIUM CHLORIDE 0.9 % IV SOLN
INTRAVENOUS | Status: DC
  Administered 2014-05-08: 17:00:00 via INTRAVENOUS

## 2014-05-08 MED ORDER — ONDANSETRON HCL 4 MG/2ML IJ SOLN
INTRAMUSCULAR | Status: AC
  Filled 2014-05-08: qty 2

## 2014-05-08 MED ORDER — ONDANSETRON HCL 4 MG PO TABS: 4.0000 mg | ORAL_TABLET | Freq: Four times a day (QID) | ORAL | Status: DC | PRN

## 2014-05-08 MED ORDER — SODIUM CHLORIDE 0.9 % IR SOLN
Status: DC | PRN
  Administered 2014-05-08: 1000 mL

## 2014-05-08 MED ORDER — DOCUSATE SODIUM 100 MG PO CAPS
100.0000 mg | ORAL_CAPSULE | Freq: Two times a day (BID) | ORAL | Status: DC
  Administered 2014-05-08 – 2014-05-16 (×15): 100 mg via ORAL
  Filled 2014-05-08 (×18): qty 1

## 2014-05-08 MED ORDER — MAGNESIUM CITRATE PO SOLN
1.0000 | Freq: Once | ORAL | Status: AC | PRN
  Filled 2014-05-08: qty 296

## 2014-05-08 MED ORDER — FENTANYL CITRATE 0.05 MG/ML IJ SOLN: 25.0000 ug | INTRAMUSCULAR | Status: DC | PRN

## 2014-05-08 MED ORDER — OXYCODONE-ACETAMINOPHEN 5-325 MG PO TABS
1.0000 | ORAL_TABLET | ORAL | Status: DC | PRN
  Administered 2014-05-10 – 2014-05-11 (×2): 2 via ORAL
  Administered 2014-05-11: 1 via ORAL
  Administered 2014-05-12 – 2014-05-16 (×4): 2 via ORAL
  Filled 2014-05-08 (×6): qty 2
  Filled 2014-05-08: qty 1
  Filled 2014-05-08: qty 2

## 2014-05-08 MED ORDER — MIDAZOLAM HCL 5 MG/5ML IJ SOLN
INTRAMUSCULAR | Status: DC | PRN
  Administered 2014-05-08: 2 mg via INTRAVENOUS

## 2014-05-08 MED ORDER — SODIUM CHLORIDE 0.9 % IV SOLN: INTRAVENOUS | Status: DC

## 2014-05-08 MED ORDER — INSULIN ASPART 100 UNIT/ML ~~LOC~~ SOLN
6.0000 [IU] | Freq: Three times a day (TID) | SUBCUTANEOUS | Status: DC
  Administered 2014-05-09 (×2): 6 [IU] via SUBCUTANEOUS

## 2014-05-08 MED ORDER — PROMETHAZINE HCL 25 MG/ML IJ SOLN: 6.2500 mg | INTRAMUSCULAR | Status: DC | PRN

## 2014-05-08 SURGICAL SUPPLY — 42 items
BLADE SURG 10 STRL SS (BLADE) IMPLANT
BNDG COHESIVE 4X5 TAN STRL (GAUZE/BANDAGES/DRESSINGS) ×2 IMPLANT
BNDG COHESIVE 6X5 TAN STRL LF (GAUZE/BANDAGES/DRESSINGS) IMPLANT
BNDG GAUZE ELAST 4 BULKY (GAUZE/BANDAGES/DRESSINGS) ×2 IMPLANT
CANISTER SUCTION WELLS/JOHNSON (MISCELLANEOUS) ×2 IMPLANT
COVER SURGICAL LIGHT HANDLE (MISCELLANEOUS) ×2 IMPLANT
CUFF TOURNIQUET SINGLE 18IN (TOURNIQUET CUFF) IMPLANT
CUFF TOURNIQUET SINGLE 24IN (TOURNIQUET CUFF) IMPLANT
CUFF TOURNIQUET SINGLE 34IN LL (TOURNIQUET CUFF) IMPLANT
CUFF TOURNIQUET SINGLE 44IN (TOURNIQUET CUFF) IMPLANT
DRAPE U-SHAPE 47X51 STRL (DRAPES) ×2 IMPLANT
DRSG ADAPTIC 3X8 NADH LF (GAUZE/BANDAGES/DRESSINGS) ×2 IMPLANT
DRSG PAD ABDOMINAL 8X10 ST (GAUZE/BANDAGES/DRESSINGS) ×2 IMPLANT
DURAPREP 26ML APPLICATOR (WOUND CARE) ×2 IMPLANT
ELECT CAUTERY BLADE 6.4 (BLADE) IMPLANT
ELECT REM PT RETURN 9FT ADLT (ELECTROSURGICAL) ×2
ELECTRODE REM PT RTRN 9FT ADLT (ELECTROSURGICAL) ×1 IMPLANT
GAUZE SPONGE 4X4 12PLY STRL (GAUZE/BANDAGES/DRESSINGS) ×2 IMPLANT
GLOVE BIOGEL PI IND STRL 9 (GLOVE) ×1 IMPLANT
GLOVE BIOGEL PI INDICATOR 9 (GLOVE) ×1
GLOVE SURG ORTHO 9.0 STRL STRW (GLOVE) ×2 IMPLANT
GOWN STRL REUS W/ TWL XL LVL3 (GOWN DISPOSABLE) ×2 IMPLANT
GOWN STRL REUS W/TWL XL LVL3 (GOWN DISPOSABLE) ×2
HANDPIECE INTERPULSE COAX TIP (DISPOSABLE)
KIT BASIN OR (CUSTOM PROCEDURE TRAY) ×2 IMPLANT
KIT ROOM TURNOVER OR (KITS) ×2 IMPLANT
MANIFOLD NEPTUNE II (INSTRUMENTS) IMPLANT
NS IRRIG 1000ML POUR BTL (IV SOLUTION) ×2 IMPLANT
PACK ORTHO EXTREMITY (CUSTOM PROCEDURE TRAY) ×2 IMPLANT
PAD ARMBOARD 7.5X6 YLW CONV (MISCELLANEOUS) ×2 IMPLANT
PADDING CAST COTTON 6X4 STRL (CAST SUPPLIES) IMPLANT
SET HNDPC FAN SPRY TIP SCT (DISPOSABLE) IMPLANT
SPONGE LAP 18X18 X RAY DECT (DISPOSABLE) ×2 IMPLANT
STOCKINETTE IMPERVIOUS 9X36 MD (GAUZE/BANDAGES/DRESSINGS) IMPLANT
SUT ETHILON 2 0 PSLX (SUTURE) ×2 IMPLANT
SWAB COLLECTION DEVICE MRSA (MISCELLANEOUS) ×2 IMPLANT
TOWEL OR 17X24 6PK STRL BLUE (TOWEL DISPOSABLE) ×2 IMPLANT
TOWEL OR 17X26 10 PK STRL BLUE (TOWEL DISPOSABLE) ×2 IMPLANT
TUBE ANAEROBIC SPECIMEN COL (MISCELLANEOUS) ×2 IMPLANT
TUBE CONNECTING 12X1/4 (SUCTIONS) ×2 IMPLANT
UNDERPAD 30X30 INCONTINENT (UNDERPADS AND DIAPERS) IMPLANT
YANKAUER SUCT BULB TIP NO VENT (SUCTIONS) ×2 IMPLANT

## 2014-05-08 NOTE — Progress Notes (Signed)
ANTIBIOTIC CONSULT NOTE - FOLLOW-UP  Pharmacy Consult for Vancomycin + Zosyn Indication: Diabetic foot infection  Allergies  Allergen Reactions  . Tramadol Nausea And Vomiting    Patient Measurements: Height: 5' 2.5" (158.8 cm) Weight: 185 lb (83.915 kg) IBW/kg (Calculated) : 51.25  Vital Signs: Temp: 99.3 F (37.4 C) (03/01 0526) Temp Source: Oral (03/01 0526) BP: 146/70 mmHg (03/01 1402) Pulse Rate: 92 (03/01 0526) Intake/Output from previous day: 02/29 0701 - 03/01 0700 In: 3056.7 [P.O.:840; I.V.:1566.7; IV Piggyback:650] Out: 550 [Urine:550] Intake/Output from this shift: Total I/O In: 987.5 [I.V.:437.5; IV Piggyback:550] Out: -   Labs:  Recent Labs  05/06/14 1328 05/07/14 0528 05/08/14 0607  WBC 8.8 8.5 9.3  HGB 9.4* 7.9* 7.8*  PLT 228 217 210  CREATININE 1.19* 1.69* 2.23*   Estimated Creatinine Clearance: 26.6 mL/min (by C-G formula based on Cr of 2.23). No results for input(s): VANCOTROUGH, VANCOPEAK, VANCORANDOM, GENTTROUGH, GENTPEAK, GENTRANDOM, TOBRATROUGH, TOBRAPEAK, TOBRARND, AMIKACINPEAK, AMIKACINTROU, AMIKACIN in the last 72 hours.   Microbiology: Recent Results (from the past 720 hour(s))  Culture, blood (routine x 2)     Status: None (Preliminary result)   Collection Time: 05/06/14  5:04 PM  Result Value Ref Range Status   Specimen Description BLOOD RIGHT ARM  Final   Special Requests BOTTLES DRAWN AEROBIC AND ANAEROBIC 10ML  Final   Culture   Final           BLOOD CULTURE RECEIVED NO GROWTH TO DATE CULTURE WILL BE HELD FOR 5 DAYS BEFORE ISSUING A FINAL NEGATIVE REPORT [facility]formed [facility]    Report Status PENDING  Incomplete  Culture, blood (routine x 2)     Status: None (Preliminary result)   Collection Time: 05/06/14  5:07 PM  Result Value Ref Range Status   Specimen Description BLOOD LEFT ARM  Final   Special Requests BOTTLES DRAWN AEROBIC AND ANAEROBIC 10CC  Final   Culture   Final           BLOOD CULTURE RECEIVED NO  GROWTH TO DATE CULTURE WILL BE HELD FOR 5 DAYS BEFORE ISSUING A FINAL NEGATIVE REPORT Performed at [facility]    Report Status PENDING  Incomplete    Medical History: Past Medical History  Diagnosis Date  . Diabetes mellitus     Assessment: [age] YOF with history of DM who [facility] with body aches, chills, and R foot pain after stepping on a tack a couple of days ago. The patient is noted to have significant swelling and redness and pharmacy was consulted to start Vancomycin + Zosyn for cellulitis and diabetic foot infection.  After initial improvement on IV antibiotics, patient has repeat fever and increased swelling overnight.  Noted plans for ortho to eval for possible I&D of the wound.  SCr has also increased significantly over the last 48 hours (1.19 > 1.69 > 2.23).  Will need to adjust Vancomycin dosing.  Goal of Therapy:  Vancomycin trough level 10-15 mcg/ml  Proper antibiotics for infection/cultures adjusted for renal/hepatic function   Plan:  Change Vancomycin 750 mg IV every 24 hours (next dose at 1200 on 3/2) Continue Zosyn 3.375g IV very 8 hours (infused over 4 hours) Follow-up surgical plans Will continue to follow renal function, culture results, LOT, and antibiotic de-escalation plans   Toys 'R' UsKimberly Ellin Fitzgibbons, Pharm.D., BCPS Clinical Pharmacist Pager [phone] 05/08/2014 2:42 PM

## 2014-05-08 NOTE — H&P (Signed)
Rachel Gonzalez is an 64 y.o. female.   Chief Complaint: Cellulitis pain right foot status post stepping on a nail approximately 3 days ago. HPI: Patient is a 64 year old woman with diabetic insensate neuropathy who states that she stepped on a nail about 3 days ago she's been having increasing pain and increasing redness increasing swelling is swelling and presents at this time for evaluation and treatment.  Past Medical History  Diagnosis Date  . Diabetes mellitus     Past Surgical History  Procedure Laterality Date  . Arm surgery     . Cesarean section      Family History  Problem Relation Age of Onset  . Diabetes Other    Social History:  reports that she has never smoked. She does not have any smokeless tobacco history on file. She reports that she does not drink alcohol or use illicit drugs.  Allergies:  Allergies  Allergen Reactions  . Tramadol Nausea And Vomiting    Medications Prior to Admission  Medication Sig Dispense Refill  . diphenhydrAMINE (BENADRYL) 25 MG tablet Take 50 mg by mouth at bedtime.     . insulin NPH-regular (NOVOLIN 70/30) (70-30) 100 UNIT/ML injection Inject 15-20 Units into the skin 2 (two) times daily with a meal. Inject 20 units every morning and 15 units every night    . naproxen sodium (ANAPROX) 220 MG tablet Take 440 mg by mouth 2 (two) times daily as needed (pain). Aleve    . oxyCODONE-acetaminophen (PERCOCET/ROXICET) 5-325 MG per tablet Take 2 tablets by mouth once. (Patient not taking: Reported on 05/06/2014) 15 tablet 0    Results for orders placed or performed during the hospital encounter of 05/06/14 (from the past 48 hour(s))  I-Stat CG4 Lactic Acid, ED     Status: None   Collection Time: 05/06/14  5:39 PM  Result Value Ref Range   Lactic Acid, Venous 1.24 0.5 - 2.0 mmol/L  CBG monitoring, ED     Status: Abnormal   Collection Time: 05/06/14  6:35 PM  Result Value Ref Range   Glucose-Capillary 259 (H) 70 - 99 mg/dL  CG4 I-STAT  (Lactic acid)     Status: None   Collection Time: 05/06/14  8:23 PM  Result Value Ref Range   Lactic Acid, Venous 0.91 0.5 - 2.0 mmol/L  HIV antibody     Status: None   Collection Time: 05/06/14  8:40 PM  Result Value Ref Range   HIV Screen 4th Generation wRfx Non Reactive Non Reactive    Comment: (NOTE) Performed At: [facility] [place], [place] [id number] Lindon Romp MD [id number]   Sedimentation rate     Status: Abnormal   Collection Time: 05/06/14  8:40 PM  Result Value Ref Range   Sed Rate 115 (H) 0 - 22 mm/hr  C-reactive protein     Status: Abnormal   Collection Time: 05/06/14  8:40 PM  Result Value Ref Range   CRP 16.9 (H) <0.60 mg/dL    Comment: Performed at [facility]  Prealbumin     Status: Abnormal   Collection Time: 05/06/14  8:40 PM  Result Value Ref Range   Prealbumin 11.4 (L) 17.0 - 34.0 mg/dL    Comment: Performed at [facility]  Hepatic function panel     Status: Abnormal   Collection Time: 05/06/14  8:40 PM  Result Value Ref Range   Total Protein 6.6 6.0 - 8.3 g/dL   Albumin 2.9 (L) 3.5 -  5.2 g/dL   AST 13 0 - 37 U/L   ALT 13 0 - 35 U/L   Alkaline Phosphatase 122 (H) 39 - 117 U/L   Total Bilirubin 0.6 0.3 - 1.2 mg/dL   Bilirubin, Direct 0.1 0.0 - 0.5 mg/dL   Indirect Bilirubin 0.5 0.3 - 0.9 mg/dL  Magnesium     Status: None   Collection Time: 05/06/14  8:40 PM  Result Value Ref Range   Magnesium 1.9 1.5 - 2.5 mg/dL  Phosphorus     Status: None   Collection Time: 05/06/14  8:40 PM  Result Value Ref Range   Phosphorus 2.5 2.3 - 4.6 mg/dL  TSH     Status: None   Collection Time: 05/06/14  8:40 PM  Result Value Ref Range   TSH 2.685 0.350 - 4.500 uIU/mL  Hemoglobin A1c     Status: Abnormal   Collection Time: 05/06/14  8:40 PM  Result Value Ref Range   Hgb A1c MFr Bld 16.2 (H) 4.8 - 5.6 %    Comment: (NOTE)         Pre-diabetes: 5.7 - 6.4         Diabetes: >6.4         Glycemic control for adults  with diabetes: <7.0    Mean Plasma Glucose 418 mg/dL    Comment: (NOTE) Performed At: [facility] [place], [place] [id number] Lindon Romp MD [id number]   Glucose, capillary     Status: Abnormal   Collection Time: 05/06/14  9:11 PM  Result Value Ref Range   Glucose-Capillary 207 (H) 70 - 99 mg/dL  Vitamin B12     Status: None   Collection Time: 05/06/14 10:35 PM  Result Value Ref Range   Vitamin B-12 384 211 - 911 pg/mL    Comment: Performed at [facility]  Folate     Status: None   Collection Time: 05/06/14 10:35 PM  Result Value Ref Range   Folate 17.4 ng/mL    Comment: (NOTE) Reference Ranges        Deficient:       0.4 - 3.3 ng/mL        Indeterminate:   3.4 - 5.4 ng/mL        Normal:              > 5.4 ng/mL Performed at [facility]   Iron and TIBC     Status: Abnormal   Collection Time: 05/06/14 10:35 PM  Result Value Ref Range   Iron <10 (L) 42 - 145 ug/dL   TIBC Not calculated due to Iron <10. 250 - 470 ug/dL   Saturation Ratios Not calculated due to Iron <10. 20 - 55 %   UIBC 205 125 - 400 ug/dL    Comment: Performed at [facility]  Ferritin     Status: None   Collection Time: 05/06/14 10:35 PM  Result Value Ref Range   Ferritin 154 10 - 291 ng/mL    Comment: Performed at [facility]  Glucose, capillary     Status: Abnormal   Collection Time: 05/06/14 10:35 PM  Result Value Ref Range   Glucose-Capillary 189 (H) 70 - 99 mg/dL  Basic metabolic panel     Status: Abnormal   Collection Time: 05/07/14  5:28 AM  Result Value Ref Range   Sodium 136 135 - 145 mmol/L   Potassium 4.6 3.5 - 5.1 mmol/L   Chloride 103 96 -  112 mmol/L   CO2 27 19 - 32 mmol/L   Glucose, Bld 333 (H) 70 - 99 mg/dL   BUN 21 6 - 23 mg/dL   Creatinine, Ser 1.69 (H) 0.50 - 1.10 mg/dL   Calcium 8.5 8.4 - 10.5 mg/dL   GFR calc non Af Amer 31 (L) >90 mL/min   GFR calc Af Amer 36 (L) >90 mL/min    Comment: (NOTE) The eGFR has  been calculated using the CKD EPI equation. This calculation has not been validated in all clinical situations. eGFR's persistently <90 mL/min signify possible Chronic Kidney Disease.    Anion gap 6 5 - 15  CBC     Status: Abnormal   Collection Time: 05/07/14  5:28 AM  Result Value Ref Range   WBC 8.5 4.0 - 10.5 K/uL   RBC 3.04 (L) 3.87 - 5.11 MIL/uL   Hemoglobin 7.9 (L) 12.0 - 15.0 g/dL   HCT 24.7 (L) 36.0 - 46.0 %   MCV 81.3 78.0 - 100.0 fL   MCH 26.0 26.0 - 34.0 pg   MCHC 32.0 30.0 - 36.0 g/dL   RDW 13.7 11.5 - 15.5 %   Platelets 217 150 - 400 K/uL  Lipid panel     Status: None   Collection Time: 05/07/14  5:28 AM  Result Value Ref Range   Cholesterol 140 0 - 200 mg/dL   Triglycerides 54 <150 mg/dL   HDL 61 >39 mg/dL   Total CHOL/HDL Ratio 2.3 RATIO   VLDL 11 0 - 40 mg/dL   LDL Cholesterol 68 0 - 99 mg/dL    Comment:        Total Cholesterol/HDL:CHD Risk Coronary Heart Disease Risk Table                     Men   Women  1/2 Average Risk   3.4   3.3  Average Risk       5.0   4.4  2 X Average Risk   9.6   7.1  3 X Average Risk  23.4   11.0        Use the calculated Patient Ratio above and the CHD Risk Table to determine the patient's CHD Risk.        ATP III CLASSIFICATION (LDL):  <100     mg/dL   Optimal  100-129  mg/dL   Near or Above                    Optimal  130-159  mg/dL   Borderline  160-189  mg/dL   High  >190     mg/dL   Very High   Glucose, capillary     Status: Abnormal   Collection Time: 05/07/14  8:25 AM  Result Value Ref Range   Glucose-Capillary 290 (H) 70 - 99 mg/dL  Glucose, capillary     Status: Abnormal   Collection Time: 05/07/14  4:33 PM  Result Value Ref Range   Glucose-Capillary 148 (H) 70 - 99 mg/dL  Urinalysis, Routine w reflex microscopic     Status: Abnormal   Collection Time: 05/07/14  6:22 PM  Result Value Ref Range   Color, Urine YELLOW YELLOW   APPearance CLEAR CLEAR   Specific Gravity, Urine 1.024 1.005 - 1.030   pH 5.0  5.0 - 8.0   Glucose, UA >1000 (A) NEGATIVE mg/dL   Hgb urine dipstick NEGATIVE NEGATIVE   Bilirubin Urine NEGATIVE NEGATIVE   Ketones, ur   NEGATIVE NEGATIVE mg/dL   Protein, ur 30 (A) NEGATIVE mg/dL   Urobilinogen, UA 1.0 0.0 - 1.0 mg/dL   Nitrite NEGATIVE NEGATIVE   Leukocytes, UA SMALL (A) NEGATIVE  Urine microscopic-add on     Status: Abnormal   Collection Time: 05/07/14  6:22 PM  Result Value Ref Range   Squamous Epithelial / LPF FEW (A) RARE   WBC, UA 7-10 <3 WBC/hpf   RBC / HPF 0-2 <3 RBC/hpf   Bacteria, UA FEW (A) RARE  Glucose, capillary     Status: Abnormal   Collection Time: 05/07/14  9:38 PM  Result Value Ref Range   Glucose-Capillary 280 (H) 70 - 99 mg/dL  Glucose, capillary     Status: Abnormal   Collection Time: 05/08/14  3:59 AM  Result Value Ref Range   Glucose-Capillary 332 (H) 70 - 99 mg/dL  CBC     Status: Abnormal   Collection Time: 05/08/14  6:07 AM  Result Value Ref Range   WBC 9.3 4.0 - 10.5 K/uL   RBC 2.92 (L) 3.87 - 5.11 MIL/uL   Hemoglobin 7.8 (L) 12.0 - 15.0 g/dL   HCT 24.2 (L) 36.0 - 46.0 %   MCV 82.9 78.0 - 100.0 fL   MCH 26.7 26.0 - 34.0 pg   MCHC 32.2 30.0 - 36.0 g/dL   RDW 13.7 11.5 - 15.5 %   Platelets 210 150 - 400 K/uL  Basic metabolic panel     Status: Abnormal   Collection Time: 05/08/14  6:07 AM  Result Value Ref Range   Sodium 134 (L) 135 - 145 mmol/L   Potassium 5.1 3.5 - 5.1 mmol/L   Chloride 99 96 - 112 mmol/L   CO2 22 19 - 32 mmol/L   Glucose, Bld 379 (H) 70 - 99 mg/dL   BUN 30 (H) 6 - 23 mg/dL   Creatinine, Ser 2.23 (H) 0.50 - 1.10 mg/dL   Calcium 8.3 (L) 8.4 - 10.5 mg/dL   GFR calc non Af Amer 22 (L) >90 mL/min   GFR calc Af Amer 26 (L) >90 mL/min    Comment: (NOTE) The eGFR has been calculated using the CKD EPI equation. This calculation has not been validated in all clinical situations. eGFR's persistently <90 mL/min signify possible Chronic Kidney Disease.    Anion gap 13 5 - 15  Glucose, capillary     Status:  Abnormal   Collection Time: 05/08/14  7:52 AM  Result Value Ref Range   Glucose-Capillary 368 (H) 70 - 99 mg/dL  Type and screen     Status: None   Collection Time: 05/08/14 10:30 AM  Result Value Ref Range   ABO/RH(D) O NEG    Antibody Screen NEG    Sample Expiration 05/11/2014   ABO/Rh     Status: None   Collection Time: 05/08/14 10:30 AM  Result Value Ref Range   ABO/RH(D) O NEG   Glucose, capillary     Status: Abnormal   Collection Time: 05/08/14 12:40 PM  Result Value Ref Range   Glucose-Capillary 273 (H) 70 - 99 mg/dL  Glucose, capillary     Status: Abnormal   Collection Time: 05/08/14  4:41 PM  Result Value Ref Range   Glucose-Capillary 258 (H) 70 - 99 mg/dL   Us Renal  05/07/2014   CLINICAL DATA:  Acute renal failure, diabetes  EXAM: RENAL/URINARY TRACT ULTRASOUND COMPLETE  COMPARISON:  None.  FINDINGS: Right Kidney:  Length: 9.5 cm. Echogenicity within normal limits. No mass   or hydronephrosis visualized.  Left Kidney:  Length: 10.2 cm. Echogenicity within normal limits. No mass or hydronephrosis visualized.  Bladder:  Appears normal for degree of bladder distention.  IMPRESSION: No obstructive uropathy.  Normal renal ultrasound.   Electronically Signed   By: Kathreen Devoid   On: 05/07/2014 16:12    Review of Systems  All other systems reviewed and are negative.   Blood pressure 146/70, pulse 96, temperature 98.9 F (37.2 C), temperature source Oral, resp. rate 20, height 5' 2.5" (1.588 m), weight 83.915 kg (185 lb), SpO2 94 %. Physical Exam  On examination patient is alert oriented her foot is globally tender to palpation she does have a palpable dorsalis pedis pulse and review of her laboratory studies patient is a severely uncontrolled diabetic with a hemoglobin A1c of 16.2 this is essentially life-threatening. She has this significantly elevated sedimentation rate and C-reactive protein. The radiographs do not show any destructive bony changes but on examination she has  cellulitis dorsally and an ulcer plantarly which may involve the sesamoid bones may involve the flexor tendon to the great toe. This may extend proximally. Assessment/Plan Assessment severely uncontrolled diabetic with insensate neuropathy with a puncture wound and abscess beneath the first metatarsal head right foot.  Plan: We will plan for irrigation and debridement may require excision of the FHL tendon. May require excision of the sesamoid bones may require extensive debridement. Risks and benefits of surgery were discussed including potential for loss of limb potential for repeat surgery. Patient states she understands wishes to proceed at this time.  Ezekeil Bethel V 05/08/2014, 5:24 PM

## 2014-05-08 NOTE — Progress Notes (Signed)
Patient ring, dentures, and clothes received from short stay in patient belongings bag, which was hung in the patient's room 5w38 in the closet. Also informed old IV site the was infusing vacomycin infiltrated, will report to oncoming RN to watch site old IV site, see chart for IV site details.

## 2014-05-08 NOTE — Progress Notes (Signed)
PATIENT DETAILS Name: Rachel Gonzalez Age: 64 y.o. Sex: female Date of Birth: 08-25-1950 Admit Date: 05/06/2014 Admitting Physician Nadara Mustard, MD PCP:Pcp Not In System  Subjective: Unfortunately-after initial improvement-her right foot is again swollen. Febrile overnight. Complains of not feeling "good".  Assessment/Plan: Principal Problem:  Right diabetic foot: Admitted and started on intravenous vancomycin and Zosyn, initially improved him a however this morning she appears to haven't really develop worsening swelling. She was also febrile last night. Suspect she needs incision and debridement, I have spoken with Dr. Vernelle Emerald consult. He does not recommend a MRI at this time. Keep nothing by mouth, for possible incision and drainage later today. Continue IV antibiotics.  Active Problems:   Acute renal failure: Likely worsening secondary to sepsis/Sirs from above. Increase IV fluids to 125 mL an hour. UA without proteinuria. Await renal ultrasound. Continue to follow closely.    SIR's: Likely secondary to diabetic foot he did continue antibiotics. Blood culture on 2/28 negative.    Uncontrolled diabetes: A1c 16.2!. Increase Lantus to 24 units, changed SSI to resistance scale, add 6 units of NovoLog with meals. Suspect she has been not compliant with insulin regimen as outpatient.    Anemia: Likely secondary to acute illness on top of iron deficiency/chronic disease. No overt evidence of any blood loss-patient denies any hematochezia or melena. Continue to monitor and transfuse as needed. Type and screen.    Diabetic neuropathy: Continue with Neurontin.    Hypertension: Continue with BiDil.    Obesity (BMI 30-39.9): Counseled   Disposition: Remain inpatient  Antibiotics:  See below   Anti-infectives    Start     Dose/Rate Route Frequency Ordered Stop   05/07/14 1200  vancomycin (VANCOCIN) 1,500 mg in sodium chloride 0.9 % 500 mL IVPB     1,500 mg 250 mL/hr  over 120 Minutes Intravenous Every 24 hours 05/06/14 2101     05/07/14 0200  piperacillin-tazobactam (ZOSYN) IVPB 3.375 g     3.375 g 12.5 mL/hr over 240 Minutes Intravenous Every 8 hours 05/06/14 2101     05/06/14 1830  clindamycin (CLEOCIN) IVPB 600 mg  Status:  Discontinued     600 mg 100 mL/hr over 30 Minutes Intravenous  Once 05/06/14 1825 05/06/14 2032   05/06/14 1715  vancomycin (VANCOCIN) IVPB 1000 mg/200 mL premix     1,000 mg 200 mL/hr over 60 Minutes Intravenous  Once 05/06/14 1708 05/06/14 1917   05/06/14 1715  piperacillin-tazobactam (ZOSYN) IVPB 3.375 g     3.375 g 12.5 mL/hr over 240 Minutes Intravenous  Once 05/06/14 1708 05/06/14 2127      DVT Prophylaxis: Prophylactic  Heparin   Code Status: Full code   Family Communication None at bedside  Procedures:  Non3  CONSULTS:  orthopedic surgery-Dr Lajoyce Corners  Time spent 40 minutes-which includes 50% of the time with face-to-face with patient/ family and coordinating care related to the above assessment and plan.  MEDICATIONS: Scheduled Meds: . feeding supplement (PRO-STAT SUGAR FREE 64)  30 mL Oral BID  . gabapentin  100 mg Oral TID  . heparin  5,000 Units Subcutaneous 3 times per day  . insulin aspart  0-20 Units Subcutaneous TID WC  . insulin aspart  6 Units Subcutaneous TID WC  . insulin detemir  18 Units Subcutaneous QHS  . isosorbide-hydrALAZINE  1 tablet Oral BID  . pantoprazole  40 mg Oral Q1200  . piperacillin-tazobactam (ZOSYN)  IV  3.375 g Intravenous  Q8H  . vancomycin  1,500 mg Intravenous Q24H   Continuous Infusions: . sodium chloride 125 mL/hr at 05/08/14 0914   PRN Meds:.acetaminophen **OR** acetaminophen, morphine injection, ondansetron **OR** ondansetron (ZOFRAN) IV, polyethylene glycol    PHYSICAL EXAM: Vital signs in last 24 hours: Filed Vitals:   05/07/14 2126 05/07/14 2257 05/08/14 0025 05/08/14 0526  BP: 160/63   131/61  Pulse: 99   92  Temp: 101.3 F (38.5 C) 102 F (38.9 C)  98.3 F (36.8 C) 99.3 F (37.4 C)  TempSrc: Oral Oral Oral Oral  Resp: 20   18  Height:      Weight:      SpO2: 92%   98%    Weight change:  Filed Weights   05/06/14 1301  Weight: 83.915 kg (185 lb)   Body mass index is 33.28 kg/(m^2).   Gen Exam: Awake and alert with clear speech.  Looks acutely ill. Neck: Supple, No JVD.   Chest: B/L Clear. No rales or rhonchi  CVS: S1 S2 Regular, no murmurs.  Abdomen: soft, BS +, non tender, non distended.  Extremities:Right foot: More erythema/more swelling in the anterior right foot area. More tenderness compared to yesterday. Swelling now extends to the entire foot and lower leg. Neurologic: Non Focal.   Skin: No Rash.   Wounds: N/A.   Intake/Output from previous day:  Intake/Output Summary (Last 24 hours) at 05/08/14 1059 Last data filed at 05/08/14 0900  Gross per 24 hour  Intake 2936.67 ml  Output    550 ml  Net 2386.67 ml     LAB RESULTS: CBC  Recent Labs Lab 05/06/14 1328 05/07/14 0528 05/08/14 0607  WBC 8.8 8.5 9.3  HGB 9.4* 7.9* 7.8*  HCT 29.3* 24.7* 24.2*  PLT 228 217 210  MCV 81.4 81.3 82.9  MCH 26.1 26.0 26.7  MCHC 32.1 32.0 32.2  RDW 13.6 13.7 13.7    Chemistries   Recent Labs Lab 05/06/14 1328 05/06/14 2040 05/07/14 0528 05/08/14 0607  NA 135  --  136 134*  K 4.7  --  4.6 5.1  CL 101  --  103 99  CO2 27  --  27 22  GLUCOSE 447*  --  333* 379*  BUN 19  --  21 30*  CREATININE 1.19*  --  1.69* 2.23*  CALCIUM 9.1  --  8.5 8.3*  MG  --  1.9  --   --     CBG:  Recent Labs Lab 05/07/14 0825 05/07/14 1633 05/07/14 2138 05/08/14 0359 05/08/14 0752  GLUCAP 290* 148* 280* 332* 368*    GFR Estimated Creatinine Clearance: 26.6 mL/min (by C-G formula based on Cr of 2.23).  Coagulation profile No results for input(s): INR, PROTIME in the last 168 hours.  Cardiac Enzymes No results for input(s): CKMB, TROPONINI, MYOGLOBIN in the last 168 hours.  Invalid input(s): CK  Invalid input(s):  POCBNP No results for input(s): DDIMER in the last 72 hours.  Recent Labs  05/06/14 2040  HGBA1C 16.2*    Recent Labs  05/07/14 0528  CHOL 140  HDL 61  LDLCALC 68  TRIG 54  CHOLHDL 2.3    Recent Labs  05/06/14 2040  TSH 2.685    Recent Labs  05/06/14 2235  VITAMINB12 384  FOLATE 17.4  FERRITIN 154  TIBC Not calculated due to Iron <10.  IRON <10*   No results for input(s): LIPASE, AMYLASE in the last 72 hours.  Urine Studies No results for input(s):  UHGB, CRYS in the last 72 hours.  Invalid input(s): UACOL, UAPR, USPG, UPH, UTP, UGL, UKET, UBIL, UNIT, UROB, ULEU, UEPI, UWBC, URBC, UBAC, CAST, UCOM, BILUA  MICROBIOLOGY: Recent Results (from the past 240 hour(s))  Culture, blood (routine x 2)     Status: None (Preliminary result)   Collection Time: 05/06/14  5:04 PM  Result Value Ref Range Status   Specimen Description BLOOD RIGHT ARM  Final   Special Requests BOTTLES DRAWN AEROBIC AND ANAEROBIC  Final   Culture   Final           BLOOD CULTURE RECEIVED NO GROWTH TO DATE CULTURE WILL BE HELD FOR 5 DAYS BEFORE ISSUING A FINAL NEGATIVE REPORT Performed at [facility]    Report Status PENDING  Incomplete  Culture, blood (routine x 2)     Status: None (Preliminary result)   Collection Time: 05/06/14  5:07 PM  Result Value Ref Range Status   Specimen Description BLOOD LEFT ARM  Final   Special Requests BOTTLES DRAWN AEROBIC AND ANAEROBIC 10CC  Final   Culture   Final           BLOOD CULTURE RECEIVED NO GROWTH TO DATE CULTURE WILL BE HELD FOR 5 DAYS BEFORE ISSUING A FINAL NEGATIVE REPORT Performed at [facility]    Report Status PENDING  Incomplete    RADIOLOGY STUDIES/RESULTS: Dg Chest 2 View (if Patient Has Fever And/or Copd)  05/06/2014   CLINICAL DATA:  Stepped on a tack 2 days ago. Pain in the plantar surface of the right foot. Mid sternal chest pain and shortness of breath [place] in [place].  EXAM: CHEST  2 VIEW  COMPARISON:   02/26/2011  FINDINGS: The heart size and mediastinal contours are within normal limits. Both lungs are clear. The visualized skeletal structures are unremarkable.  IMPRESSION: No active cardiopulmonary disease.   Electronically Signed   By: Norva Pavlov M.D.   On: 05/06/2014 14:24   US Renal  05/07/2014   CLINICAL DATA:  Acute renal failure, diabetes  EXAM: RENAL/URINARY TRACT ULTRASOUND COMPLETE  COMPARISON:  None.  FINDINGS: Right Kidney:  Length: 9.5 cm. Echogenicity within normal limits. No mass or hydronephrosis visualized.  Left Kidney:  Length: 10.2 cm. Echogenicity within normal limits. No mass or hydronephrosis visualized.  Bladder:  Appears normal for degree of bladder distention.  IMPRESSION: No obstructive uropathy.  Normal renal ultrasound.   Electronically Signed   By: Elige Ko   On: 05/07/2014 16:12   Dg Foot Complete Right  05/06/2014   CLINICAL DATA:  Stepped on a attack 2 days ago. Pain on plantar surface medial side of right foot.  EXAM: RIGHT FOOT COMPLETE - 3+ VIEW  COMPARISON:  None.  FINDINGS: Soft tissue swelling throughout the foot. No fracture, Old healed fracture within the midshaft of the right third proximal phalanx. No acute fracture, subluxation or dislocation. No radiopaque foreign body.  IMPRESSION: Diffuse soft tissue swelling. No acute bony abnormality or radiopaque foreign body.   Electronically Signed   By: Charlett Nose M.D.   On: 05/06/2014 14:25    Jeoffrey Massed, MD  [facility] Pager:[phone]  If 7PM-7AM, please contact night-coverage [email or web address] Password [email or web address] 05/08/2014, 10:59 AM   LOS: 2 days

## 2014-05-08 NOTE — Transfer of Care (Signed)
Immediate Anesthesia Transfer of Care Note  Patient: Rachel Gonzalez  Procedure(s) Performed: Procedure(s): IRRIGATION AND DEBRIDEMENT FOOT (Right) AMPUTATION RAY, right great toe (Right)  Patient Location: PACU  Anesthesia Type:General  Level of Consciousness: awake, alert  and oriented  Airway & Oxygen Therapy: Patient Spontanous Breathing and Patient connected to nasal cannula oxygen  Post-op Assessment: Report given to RN and Post -op Vital signs reviewed and stable  Post vital signs: Reviewed and stable  Last Vitals:  Filed Vitals:   05/08/14 1402  BP: 146/70  Pulse: 96  Temp: 37.2 C  Resp: 20    Complications: No apparent anesthesia complications

## 2014-05-08 NOTE — Progress Notes (Signed)
Orthopedic Tech Progress Note Patient Details:  Rachel Gonzalez 1951-01-02 [id number]  Ortho Devices Type of Ortho Device: Postop shoe/boot Ortho Device/Splint Location: RLE Ortho Device/Splint Interventions: Ordered, Application   [name], Myrick Mcnairy Craig 05/08/2014, 8:05 PM

## 2014-05-08 NOTE — Op Note (Signed)
05/06/2014 - 05/08/2014  6:30 PM  PATIENT:  Rachel Gonzalez    PRE-OPERATIVE DIAGNOSIS:  Abscess Right Foot  POST-OPERATIVE DIAGNOSIS:  Same  PROCEDURE:  IRRIGATION AND DEBRIDEMENT FOOT, AMPUTATION RAY, right great toe  SURGEON:  DUDA,MARCUS V, MD  PHYSICIAN ASSISTANT:None ANESTHESIA:   General  PREOPERATIVE INDICATIONS:  Rachel Gonzalez is a  64 y.o. female with a diagnosis of Abscess Right Foot who failed conservative measures and elected for surgical management.    The risks benefits and alternatives were discussed with the patient preoperatively including but not limited to the risks of infection, bleeding, nerve injury, cardiopulmonary complications, the need for revision surgery, among others, and the patient was willing to proceed.  OPERATIVE cultures:  tissue cultures obtained 1 fluid cultures obtained 1  OPERATIVE FINDINGS: Abscess with necrotic tissue surrounding the entire first metatarsal and great toe. No healthy viable tissue to sustain salvage of the first ray  OPERATIVE PROCEDURE: Patient is a 64 year old woman with uncontrolled type 2 diabetes hemoglobin A1c 16.2 who presents with a deep abscess beneath the first metatarsal head secondary to stepping on a nail. Patient presents urgently at this time for surgical intervention. Risks and benefits were discussed patient states she understands and wishes to proceed at this time.  Patient was brought to the operating room and underwent a general anesthetic. After adequate levels of anesthesia were obtained patient's right lower extremity was prepped using DuraPrep draped into a sterile field. A timeout was called. A longitudinal incision was made on the plantar aspect of the first ray. This went down to bone and there was abscess and necrotic tissue and surrounding the sesamoids surrounding the flexor tendon surrounding the first metatarsal. Due to the extensive necrotic abscess it was elected to proceed with a first ray  amputation this was her only viable option for foot salvage. The first ray was resected to the base of the first metatarsal. The soft tissue was to be back to healthy viable tissue the wound was irrigated with normal saline. Hemostasis was obtained with electrocautery. The skin was closed using 2-0 nylon. A sterile compressive dressing was applied. Patient was extubated taken to the PACU in stable condition.

## 2014-05-08 NOTE — Progress Notes (Signed)
Attempted to call report to short stay, stated they were busy and would call back my direct number [phone].

## 2014-05-08 NOTE — Anesthesia Preprocedure Evaluation (Signed)
Anesthesia Evaluation  Patient identified by MRN, date of birth, ID band Patient awake    Reviewed: Allergy & Precautions, NPO status , Patient's Chart, lab work & pertinent test results  Airway Mallampati: II  TM Distance: >3 FB Neck ROM: Full    Dental no notable dental hx.    Pulmonary neg pulmonary ROS,  breath sounds clear to auscultation  Pulmonary exam normal       Cardiovascular negative cardio ROS  Rhythm:Regular Rate:Normal     Neuro/Psych negative neurological ROS  negative psych ROS   GI/Hepatic negative GI ROS, Neg liver ROS,   Endo/Other  diabetes, Poorly Controlled, Insulin Dependent  Renal/GU Renal InsufficiencyRenal disease  negative genitourinary   Musculoskeletal negative musculoskeletal ROS (+)   Abdominal   Peds negative pediatric ROS (+)  Hematology negative hematology ROS (+) anemia ,   Anesthesia Other Findings   Reproductive/Obstetrics negative OB ROS                             Anesthesia Physical Anesthesia Plan  ASA: III  Anesthesia Plan: General   Post-op Pain Management:    Induction: Intravenous  Airway Management Planned: LMA  Additional Equipment:   Intra-op Plan:   Post-operative Plan: Extubation in OR  Informed Consent: I have reviewed the patients History and Physical, chart, labs and discussed the procedure including the risks, benefits and alternatives for the proposed anesthesia with the patient or authorized representative who has indicated his/her understanding and acceptance.   Dental advisory given  Plan Discussed with: CRNA and Surgeon  Anesthesia Plan Comments:         Anesthesia Quick Evaluation

## 2014-05-08 NOTE — Anesthesia Postprocedure Evaluation (Signed)
  Anesthesia Post-op Note  Patient: Rachel Gonzalez  Procedure(s) Performed: Procedure(s): IRRIGATION AND DEBRIDEMENT FOOT (Right) AMPUTATION RAY, right great toe (Right)  Patient Location: PACU  Anesthesia Type:General  Level of Consciousness: awake and alert   Airway and Oxygen Therapy: Patient Spontanous Breathing  Post-op Pain: mild  Post-op Assessment: Post-op Vital signs reviewed  Post-op Vital Signs: stable  Last Vitals:  Filed Vitals:   05/08/14 2143  BP: 131/55  Pulse: 99  Temp: 37.3 C  Resp: 18    Complications: No apparent anesthesia complications

## 2014-05-08 NOTE — Progress Notes (Addendum)
IV with vancomycin running on pump noted to be swollen and painful to palpation. Will notify Dr. Lajoyce Cornersuda that patient did not received complete dose of vancomycin ~ 50 ml left in bag. Removed yellow colored ring with multiple clear stones and silver colored ring with red stone from hands. Items placed in labeled denture cup. Karleen HampshireSpencer was given bag with underwear, robe, and denture cup that had rings x 2 in it.

## 2014-05-09 ENCOUNTER — Inpatient Hospital Stay (HOSPITAL_COMMUNITY): Payer: 59

## 2014-05-09 ENCOUNTER — Encounter (HOSPITAL_COMMUNITY): Payer: Self-pay | Admitting: Orthopedic Surgery

## 2014-05-09 LAB — CBC
HCT: 21.7 % — ABNORMAL LOW (ref 36.0–46.0)
HEMATOCRIT: 21.8 % — AB (ref 36.0–46.0)
HEMOGLOBIN: 7 g/dL — AB (ref 12.0–15.0)
HEMOGLOBIN: 7.1 g/dL — AB (ref 12.0–15.0)
MCH: 26.2 pg (ref 26.0–34.0)
MCH: 27.1 pg (ref 26.0–34.0)
MCHC: 32.1 g/dL (ref 30.0–36.0)
MCHC: 32.7 g/dL (ref 30.0–36.0)
MCV: 81.6 fL (ref 78.0–100.0)
MCV: 82.8 fL (ref 78.0–100.0)
PLATELETS: 228 10*3/uL (ref 150–400)
Platelets: 214 10*3/uL (ref 150–400)
RBC: 2.67 MIL/uL — ABNORMAL LOW (ref 3.87–5.11)
RBC: 2.62 MIL/uL — AB (ref 3.87–5.11)
RDW: 13.7 % (ref 11.5–15.5)
RDW: 13.7 % (ref 11.5–15.5)
WBC: 8.3 10*3/uL (ref 4.0–10.5)
WBC: 8.2 10*3/uL (ref 4.0–10.5)

## 2014-05-09 LAB — BASIC METABOLIC PANEL
ANION GAP: 8 (ref 5–15)
BUN: 32 mg/dL — ABNORMAL HIGH (ref 6–23)
CO2: 21 mmol/L (ref 19–32)
Calcium: 8 mg/dL — ABNORMAL LOW (ref 8.4–10.5)
Chloride: 103 mmol/L (ref 96–112)
Creatinine, Ser: 1.87 mg/dL — ABNORMAL HIGH (ref 0.50–1.10)
GFR calc Af Amer: 32 mL/min — ABNORMAL LOW (ref >90)
GFR calc non Af Amer: 28 mL/min — ABNORMAL LOW (ref >90)
GLUCOSE: 410 mg/dL — AB (ref 70–99)
Potassium: 5 mmol/L (ref 3.5–5.1)
SODIUM: 132 mmol/L — AB (ref 135–145)

## 2014-05-09 LAB — GLUCOSE, CAPILLARY
GLUCOSE-CAPILLARY: 344 mg/dL — AB (ref 70–99)
GLUCOSE-CAPILLARY: 149 mg/dL — AB (ref 70–99)
Glucose-Capillary: 362 mg/dL — ABNORMAL HIGH (ref 70–99)
Glucose-Capillary: 119 mg/dL — ABNORMAL HIGH (ref 70–99)

## 2014-05-09 MED ORDER — VANCOMYCIN HCL IN DEXTROSE 1-5 GM/200ML-% IV SOLN
1000.0000 mg | INTRAVENOUS | Status: AC
  Administered 2014-05-09 – 2014-05-10 (×2): 1000 mg via INTRAVENOUS
  Filled 2014-05-09 (×2): qty 200

## 2014-05-09 MED ORDER — INSULIN DETEMIR 100 UNIT/ML ~~LOC~~ SOLN
30.0000 [IU] | Freq: Every day | SUBCUTANEOUS | Status: DC
  Administered 2014-05-09 – 2014-05-11 (×3): 30 [IU] via SUBCUTANEOUS
  Filled 2014-05-09 (×4): qty 0.3

## 2014-05-09 MED ORDER — INSULIN ASPART 100 UNIT/ML ~~LOC~~ SOLN
10.0000 [IU] | Freq: Three times a day (TID) | SUBCUTANEOUS | Status: DC
  Administered 2014-05-09 – 2014-05-13 (×3): 10 [IU] via SUBCUTANEOUS
  Administered 2014-05-14: 5 [IU] via SUBCUTANEOUS
  Administered 2014-05-14 – 2014-05-16 (×6): 10 [IU] via SUBCUTANEOUS

## 2014-05-09 MED ORDER — LINACLOTIDE 145 MCG PO CAPS
145.0000 ug | ORAL_CAPSULE | Freq: Every day | ORAL | Status: DC
  Filled 2014-05-09: qty 1

## 2014-05-09 MED ORDER — MAGNESIUM HYDROXIDE 400 MG/5ML PO SUSP
960.0000 mL | Freq: Once | ORAL | Status: AC
  Administered 2014-05-09: 960 mL via RECTAL
  Filled 2014-05-09: qty 240

## 2014-05-09 MED ORDER — RIVAROXABAN 20 MG PO TABS: 20.0000 mg | ORAL_TABLET | ORAL | Status: DC

## 2014-05-09 MED ORDER — FERROUS SULFATE 325 (65 FE) MG PO TABS
325.0000 mg | ORAL_TABLET | Freq: Every day | ORAL | Status: DC
Start: 2014-05-09 — End: 2014-05-16
  Administered 2014-05-09 – 2014-05-16 (×8): 325 mg via ORAL
  Filled 2014-05-09 (×10): qty 1

## 2014-05-09 MED ORDER — PEG-KCL-NACL-NASULF-NA ASC-C 100 G PO SOLR
1.0000 | Freq: Once | ORAL | Status: AC
  Administered 2014-05-09: 200 g via ORAL
  Filled 2014-05-09: qty 1

## 2014-05-09 MED ORDER — METOPROLOL TARTRATE 12.5 MG HALF TABLET
12.5000 mg | ORAL_TABLET | Freq: Two times a day (BID) | ORAL | Status: DC
Start: 2014-05-09 — End: 2014-05-16
  Administered 2014-05-09 – 2014-05-16 (×14): 12.5 mg via ORAL
  Filled 2014-05-09 (×15): qty 1

## 2014-05-09 NOTE — Progress Notes (Signed)
ANTIBIOTIC CONSULT NOTE - FOLLOW-UP  Pharmacy Consult for Vancomycin + Zosyn Indication: Diabetic foot infection  Allergies  Allergen Reactions  . Tramadol Nausea And Vomiting    Patient Measurements: Height: 5' 2.5" (158.8 cm) Weight: 185 lb (83.915 kg) IBW/kg (Calculated) : 51.25  Vital Signs: Temp: 98.5 F (36.9 C) (03/02 0526) Temp Source: Oral (03/02 0526) BP: 146/60 mmHg (03/02 0526) Pulse Rate: 93 (03/02 0526) Intake/Output from previous day: 03/01 0701 - 03/02 0700 In: 1467.5 [P.O.:480; I.V.:437.5; IV Piggyback:550] Out: 410 [Urine:400; Blood:10] Intake/Output from this shift:    Labs:  Recent Labs  05/07/14 0528 05/08/14 0607 05/09/14 0542  WBC 8.5 9.3 8.3  HGB 7.9* 7.8* 7.0*  PLT 217 210 214  CREATININE 1.69* 2.23* 1.87*   Estimated Creatinine Clearance: 31.7 mL/min (by C-G formula based on Cr of 1.87). No results for input(s): VANCOTROUGH, VANCOPEAK, VANCORANDOM, GENTTROUGH, GENTPEAK, GENTRANDOM, TOBRATROUGH, TOBRAPEAK, TOBRARND, AMIKACINPEAK, AMIKACINTROU, AMIKACIN in the last 72 hours.   Microbiology: Recent Results (from the past 720 hour(s))  Culture, blood (routine x 2)     Status: None (Preliminary result)   Collection Time: 05/06/14  5:04 PM  Result Value Ref Range Status   Specimen Description BLOOD RIGHT ARM  Final   Special Requests BOTTLES DRAWN AEROBIC AND ANAEROBIC  Final   Culture   Final           BLOOD CULTURE RECEIVED NO GROWTH TO DATE CULTURE WILL BE HELD FOR 5 DAYS BEFORE ISSUING A FINAL NEGATIVE REPORT Performed at [facility]    Report Status PENDING  Incomplete  Culture, blood (routine x 2)     Status: None (Preliminary result)   Collection Time: 05/06/14  5:07 PM  Result Value Ref Range Status   Specimen Description BLOOD LEFT ARM  Final   Special Requests BOTTLES DRAWN AEROBIC AND ANAEROBIC 10CC  Final   Culture   Final           BLOOD CULTURE RECEIVED NO GROWTH TO DATE CULTURE WILL BE HELD FOR 5 DAYS BEFORE  ISSUING A FINAL NEGATIVE REPORT Performed at [facility]    Report Status PENDING  Incomplete  Anaerobic culture     Status: None (Preliminary result)   Collection Time: 05/08/14  5:59 PM  Result Value Ref Range Status   Specimen Description TISSUE RIGHT FOOT  Final   Special Requests PATIENT ON FOLLOWING VANCOMYCIN PART B  Final   Gram Stain PENDING  Incomplete   Culture   Final    NO ANAEROBES ISOLATED; CULTURE IN PROGRESS FOR 5 DAYS Performed at [facility]    Report Status PENDING  Incomplete  Tissue culture     Status: None (Preliminary result)   Collection Time: 05/08/14  5:59 PM  Result Value Ref Range Status   Specimen Description TISSUE RIGHT FOOT  Final   Special Requests PATIENT ON FOLLOWING VANCOMYCIN PART B  Final   Gram Stain   Final    NO WBC SEEN NO SQUAMOUS EPITHELIAL CELLS SEEN FEW GRAM POSITIVE COCCI IN PAIRS Performed at [facility]    Culture PENDING  Incomplete   Report Status PENDING  Incomplete  Culture, routine-abscess     Status: None (Preliminary result)   Collection Time: 05/08/14  6:05 PM  Result Value Ref Range Status   Specimen Description ABSCESS RIGHT FOOT  Final   Special Requests PATIENT ON FOLLOWING VANCOMYCIN PART A  Final   Gram Stain   Final  RARE WBC PRESENT, PREDOMINANTLY PMN NO SQUAMOUS EPITHELIAL CELLS SEEN RARE GRAM POSITIVE COCCI IN PAIRS [facility]    Culture NO GROWTH [facility]   Final   Report Status PENDING  Incomplete    Medical History: Past Medical History  Diagnosis Date  . Diabetes mellitus     Assessment: [age] YOF with history of DM who [facility] with body aches, chills, and R foot pain after stepping on a tack a couple of days ago. The patient is noted to have significant swelling and redness and pharmacy was consulted to start Vancomycin + Zosyn for cellulitis and diabetic foot infection.  She is s/p R foot  ray amputation and I&D on 3/1. She is afebrile and WBC are normal. Ortho note states will continue post-op antibiotics for 48 hrs. Renal function has improved slightly with SCr down to 1.87, UOP is low at 0.2 ml/kg/hr.  Goal of Therapy:  Vancomycin trough level 10-15 mcg/ml  Proper antibiotics for infection/cultures adjusted for renal/hepatic function   Plan:  Change Vancomycin to 1000 mg IV every 24 hours (next dose at 1200 today) Continue Zosyn 3.375g IV very 8 hours (infused over 4 hours) Will continue to follow renal function, culture results, LOT, and antibiotic de-escalation plans   [facility] Va Medical CenterJennifer Marathon, 17[address] Rainbow BoulevardPharm.D., BCPS Clinical Pharmacist Pager: [phone] 05/09/2014 11:29 AM

## 2014-05-09 NOTE — Evaluation (Signed)
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  Clinical Impression  Pt admitted with above diagnosis. Pt currently with functional limitations due to the deficits listed below (see PT Problem List). At the time of PT eval pt was able to perform transfers and ambulation with min assist. Pt had difficulty maintaining TDWB status during standing activity and some assist required to help off-weight the R foot during L swing-through.   Pt will benefit from skilled PT to increase their independence and safety with mobility to allow discharge to the venue listed below. Pt wishes to return home at d/c, however will be alone during the day (~12 hours) while her husband is at work. If pt is not able to arrange some assist during the day and is not progressing functionally, may want to consider rehab at d/c (CIR vs. SNF).      Follow Up Recommendations Home health PT;Supervision for mobility/OOB    Equipment Recommendations  Rolling walker with 5" wheels;3in1 (PT)    Recommendations for Other Services       Precautions / Restrictions Precautions Precautions: Fall Restrictions Weight Bearing Restrictions: Yes RLE Weight Bearing: Touchdown weight bearing      Mobility  Bed Mobility Overal bed mobility: Needs Assistance Bed Mobility: Supine to Sit     Supine to sit: Min assist     General bed mobility comments: Pt required assist for trunk elevation to full sitting at EOB. Pt required increased time to gain and maintain sitting balance.   Transfers Overall transfer level: Needs assistance Equipment used: Rolling walker (2  wheeled) Transfers: Sit to/from Stand Sit to Stand: Min assist         General transfer comment: Pt was cued for WB status on R, and was able to power-up to full standing with min assist for balance and stability.   Ambulation/Gait Ambulation/Gait assistance: Min assist Ambulation Distance (Feet): 15 Feet Assistive device: Rolling walker (2 wheeled) Gait Pattern/deviations: Step-to pattern;Decreased stride length;Trunk flexed Gait velocity: Decreased Gait velocity interpretation: Below normal speed for age/gender General Gait Details: Pt with increased difficulty maintaining TDWB status on the R side. Pt was cued for safety and sequencing with the the RW. Assist at the trunk to help offweight the R foot during step-through of the left.   Stairs            Wheelchair Mobility    Modified Rankin (Stroke Patients Only)       Balance Overall balance assessment: Needs assistance Sitting-balance support: Feet supported;No upper extremity supported Sitting balance-Leahy Scale: Fair Sitting balance - Comments: Increased time to gain and maintain sitting balance at EOB.    Standing balance support: During functional activity;Bilateral upper extremity supported Standing balance-Leahy Scale: Poor Standing balance comment: Pt requires UE support and occasional outside assist to maintain balance.                              Pertinent Vitals/Pain Pain Assessment: Faces Faces Pain Scale: Hurts little more Pain Location: R foot Pain Descriptors / Indicators: Tender;Tingling Pain Intervention(s): Limited activity within patient's tolerance;Monitored during session;Repositioned  Home Living Family/patient expects to be discharged to:: Private residence Living Arrangements: Spouse/significant other Available Help at Discharge: Family;Available PRN/intermittently Type of Home: House Home Access: Stairs to enter Entrance Stairs-Rails: Conservation officer, historic buildingsight;Left Entrance  Stairs-Number of Steps: 5 Home Layout: One level Home Equipment: None      Prior Function Level of Independence: Independent         Comments: Pt works 2 jobs, and is on her feet for most of the day.      Hand Dominance   Dominant Hand: Right    Extremity/Trunk Assessment   Upper Extremity Assessment: Defer to OT evaluation;Generalized weakness           Lower Extremity Assessment: Generalized weakness;RLE deficits/detail RLE Deficits / Details: Acute pain and decreased ankle AROM consistent with 1st ray amputation.    Cervical / Trunk Assessment: Normal  Communication   Communication: No difficulties  Cognition Arousal/Alertness: Awake/alert Behavior During Therapy: WFL for tasks assessed/performed Overall Cognitive Status: Within Functional Limits for tasks assessed                      General Comments      Exercises        Assessment/Plan    PT Assessment Patient needs continued PT services  PT Diagnosis Difficulty walking;Generalized weakness;Acute pain   PT Problem List Decreased strength;Decreased range of motion;Decreased activity tolerance;Decreased balance;Decreased mobility;Decreased knowledge of use of DME;Decreased safety awareness;Decreased knowledge of precautions;Pain  PT Treatment Interventions DME instruction;Gait training;Stair training;Functional mobility training;Therapeutic activities;Therapeutic exercise;Neuromuscular re-education;Patient/family education   PT Goals (Current goals can be found in the Care Plan section) Acute Rehab PT Goals Patient Stated Goal: Get back to work PT Goal Formulation: With patient Time For Goal Achievement: 05/16/14 Potential to Achieve Goals: Good    Frequency Min 5X/week   Barriers to discharge Decreased caregiver support Pt will be alone for ~12 hours a day while her husband is at work.     Co-evaluation               End of Session Equipment Utilized During Treatment: Gait  belt Activity Tolerance: Patient limited by fatigue Patient left: in chair;with call bell/phone within reach Nurse Communication: Mobility status         Time: 0828-0858 PT Time Calculation (min) (ACUTE ONLY): 30 min   Charges:   PT Evaluation $Initial PT Evaluation Tier I: 1 Procedure PT Treatments $Gait Training: 8-22 mins   PT G Codes:        Conni SlipperKirkman, Amar Sippel 05/09/2014, 9:24 AM  Conni SlipperLaura Addie Cederberg, PT, DPT Acute Rehabilitation Services Pager: [phone]

## 2014-05-09 NOTE — Progress Notes (Signed)
Pt refused to have soap suds enema at this time, stated she wanted to have oral bowel prep first and then enema later tonight if not result, despite reeducation on benefit of doing both at same time. Pt states she is passing more gas.

## 2014-05-09 NOTE — Progress Notes (Signed)
[facility] PROGRESS NOTE  Rachel UNCAPHER ZOX:[id number] DOB: 16-Jul-1950 DOA: 05/06/2014 PCP: Pcp Not In System  Assessment/Plan: 1. Diabetic R foot with sepsis 1. Fevers with tachycardia 2. Tmax of 100 today. No leukocytosis 3. Pt is s/p surgery on 3/1 4. Surgery recs for continued IV abx x 48hrs post-operatively 2. ARF 1. Renal function improved today 2. Continue to monitor 3. Uncontrolled DM 1. A1c of over 16 2. Recs by Diabetic Coordinator noted 3. Will increase insulin as recommended 4. Pt counseled to ensure tight glycemic control to aid in wound healing. Concerns that patient will not adhere to recs, which would result in high risk for further wound infections 4. Anemia 1. Slow trend down 2. Question post-op bleed 3. Will repeat CBC and if <7, would transfuse 4. Have stopped heparin and started SCD's for DVT prophylaxis 5. Diabetic Neuropathy 1. Per above 2. Goal is tight glycemic control 6. HTN 1. Stable but suboptimal 2. For now, avoid ACEI secondary to renal insufficency 3. Will start on metoprolol 4. Would consider transitioning to ACEI when/if renal function improves 7. Obesity 1. Counseled 8. DVT prophylxis 1. SCD's  Code Status: Full Family Communication: Pt and family in room (indicate person spoken with, relationship, and if by phone, the number) Disposition Plan: Pending   Consultants:  Orthopedic Surgery  Procedures:  Partial foot amputation 3/1  Antibiotics:  vanc  zosyn (indicate start date, and stop date if known)  HPI/Subjective: Feels abd tightness, constipated  Objective: Filed Vitals:   05/08/14 1915 05/08/14 2143 05/09/14 0526 05/09/14 1348  BP: 157/72 131/55 146/60 143/62  Pulse: 109 99 93   Temp:  99.2 F (37.3 C) 98.5 F (36.9 C) 100.5 F (38.1 C)  TempSrc:  Oral Oral Oral  Resp: Height:      Weight:      SpO2: 99% 99% 95% 100%    Intake/Output Summary (Last 24 hours) at 05/09/14 1720 Last data  filed at 05/09/14 1350  Gross per 24 hour  Intake    720 ml  Output    910 ml  Net   -190 ml   Filed Weights   05/06/14 1301  Weight: 83.915 kg (185 lb)    Exam:   General:  Awake, in nad  Cardiovascular: regular, s1, s2  Respiratory: normal resp effort, no wheezing  Abdomen: obese, decreased BS, distended  Musculoskeletal: perfused, no clubbing   Data Reviewed: Basic Metabolic Panel:  Recent Labs Lab 05/06/14 1328 05/06/14 2040 05/07/14 0528 05/08/14 0607 05/09/14 0542  NA 135  --  136 134* 132*  K 4.7  --  4.6 5.1 5.0  CL 101  --  103 99 103  CO2 27  --  GLUCOSE 447*  --  333* 379* 410*  BUN 19  --  21 30* 32*  CREATININE 1.19*  --  1.69* 2.23* 1.87*  CALCIUM 9.1  --  8.5 8.3* 8.0*  MG  --  1.9  --   --   --   PHOS  --  2.5  --   --   --    Liver Function Tests:  Recent Labs Lab 05/06/14 2040  AST 13  ALT 13  ALKPHOS 122*  BILITOT 0.6  PROT 6.6  ALBUMIN 2.9*   No results for input(s): LIPASE, AMYLASE in the last 168 hours. No results for input(s): AMMONIA in the last 168 hours. CBC:  Recent Labs Lab 05/06/14 1328 05/07/14 0528  05/08/14 0607 05/09/14 0542 05/09/14 1600  WBC 8.8 8.5 9.3 8.3 8.2  HGB 9.4* 7.9* 7.8* 7.0* 7.1*  HCT 29.3* 24.7* 24.2* 21.8* 21.7*  MCV 81.4 81.3 82.9 81.6 82.8  PLT 228 217 210 214 228   Cardiac Enzymes: No results for input(s): CKTOTAL, CKMB, CKMBINDEX, TROPONINI in the last 168 hours. BNP (last 3 results) No results for input(s): BNP in the last 8760 hours.  ProBNP (last 3 results) No results for input(s): PROBNP in the last 8760 hours.  CBG:  Recent Labs Lab 05/08/14 1641 05/08/14 1831 05/08/14 2234 05/09/14 0857 05/09/14 1204  GLUCAP 258* 302* 374* 344* 362*    Recent Results (from the past 240 hour(s))  Culture, blood (routine x 2)     Status: None (Preliminary result)   Collection Time: 05/06/14  5:04 PM  Result Value Ref Range Status   Specimen Description BLOOD RIGHT ARM   Final   Special Requests BOTTLES DRAWN AEROBIC AND ANAEROBIC 10ML  Final   Culture   Final           BLOOD CULTURE RECEIVED NO GROWTH TO DATE CULTURE WILL BE HELD FOR 5 DAYS BEFORE ISSUING A FINAL NEGATIVE REPORT Performed at [facility]    Report Status PENDING  Incomplete  Culture, blood (routine x 2)     Status: None (Preliminary result)   Collection Time: 05/06/14  5:07 PM  Result Value Ref Range Status   Specimen Description BLOOD LEFT ARM  Final   Special Requests BOTTLES DRAWN AEROBIC AND ANAEROBIC 10CC  Final   Culture   Final           BLOOD CULTURE RECEIVED NO GROWTH TO DATE CULTURE WILL BE HELD FOR 5 DAYS BEFORE ISSUING A FINAL NEGATIVE REPORT Performed at [facility]    Report Status PENDING  Incomplete  Anaerobic culture     Status: None (Preliminary result)   Collection Time: 05/08/14  5:59 PM  Result Value Ref Range Status   Specimen Description TISSUE RIGHT FOOT  Final   Special Requests PATIENT ON FOLLOWING VANCOMYCIN PART B  Final   Gram Stain PENDING  Incomplete   Culture   Final    NO ANAEROBES ISOLATED; CULTURE IN PROGRESS FOR 5 DAYS Performed at [facility]    Report Status PENDING  Incomplete  Tissue culture     Status: None (Preliminary result)   Collection Time: 05/08/14  5:59 PM  Result Value Ref Range Status   Specimen Description TISSUE RIGHT FOOT  Final   Special Requests PATIENT ON FOLLOWING VANCOMYCIN PART B  Final   Gram Stain   Final    NO WBC SEEN NO SQUAMOUS EPITHELIAL CELLS SEEN FEW GRAM POSITIVE COCCI IN PAIRS Performed at [facility]    Culture PENDING  Incomplete   Report Status PENDING  Incomplete  Culture, routine-abscess     Status: None (Preliminary result)   Collection Time: 05/08/14  6:05 PM  Result Value Ref Range Status   Specimen Description ABSCESS RIGHT FOOT  Final   Special Requests PATIENT ON FOLLOWING VANCOMYCIN PART A  Final   Gram Stain   Final    RARE WBC PRESENT, PREDOMINANTLY  PMN NO SQUAMOUS EPITHELIAL CELLS SEEN RARE GRAM POSITIVE COCCI IN PAIRS Performed at [facility]    Culture NO GROWTH Performed at [facility]   Final   Report Status PENDING  Incomplete     Studies: No results found.  Scheduled Meds: .  docusate sodium  100 mg Oral BID  . feeding supplement (PRO-STAT SUGAR FREE 64)  30 mL Oral BID  . ferrous sulfate  325 mg Oral Q breakfast  . gabapentin  100 mg Oral TID  . insulin aspart  0-20 Units Subcutaneous TID WC  . insulin aspart  10 Units Subcutaneous TID WC  . insulin detemir  30 Units Subcutaneous QHS  . isosorbide-hydrALAZINE  1 tablet Oral BID  . Linaclotide  145 mcg Oral Daily  . pantoprazole  40 mg Oral Q1200  . piperacillin-tazobactam (ZOSYN)  IV  3.375 g Intravenous Q8H  . sorbitol, milk of mag, mineral oil, glycerin (SMOG) enema  960 mL Rectal Once  . vancomycin  1,000 mg Intravenous Q24H   Continuous Infusions: . sodium chloride 125 mL/hr at 05/09/14 0316  . sodium chloride 10 mL/hr at 05/08/14 1700  . sodium chloride      Principal Problem:   Cellulitis of right lower extremity Active Problems:   HTN (hypertension)   Cellulitis   Uncontrolled diabetes mellitus   Anemia   Diabetic neuropathy, type II diabetes mellitus   Obesity (BMI 30-39.9)   Cellulitis of right foot   CHIU, STEPHEN K  [facility] Pager [phone]. If 7PM-7AM, please contact night-coverage at [email or web address], password [facility] 05/09/2014, 5:20 PM  LOS: 3 days

## 2014-05-09 NOTE — Progress Notes (Signed)
Inpatient Diabetes Program Recommendations  AACE/ADA: New Consensus Statement on Inpatient Glycemic Control (2013)  Target Ranges:  Prepandial:   less than 140 mg/dL      Peak postprandial:   less than 180 mg/dL (1-2 hours)      Critically ill patients:  140 - 180 mg/dL   Reason for Visit: Hyperglycemia  Results for [name], Baylee A (MRN [id number]) as of 05/09/2014 12:16  Ref. Range 05/08/2014 18:31 05/08/2014 22:34 05/09/2014 08:57 05/09/2014 12:04  Glucose-Capillary Latest Range: 70-99 mg/dL 086302 (H) 578374 (H) 469344 (H) 362 (H)  Results for [name], Loanne A (MRN [id number]) as of 05/09/2014 12:16  Ref. Range 05/06/2014 20:40  Hemoglobin A1C Latest Range: 4.8-5.6 % 16.2 (H)   Needs insulin adjustment.  Recommendations: Increase Levemir to 30 units QHS Increase Novolog to 10 units tidwc for meal coverage insulin.  Spoke with pt briefly about HgbA1C results and importance of f/u with PCP to achieve better glycemic control. Thank you. Ailene Ardshonda Chardai Gangemi, RD, LDN, CDE Inpatient Diabetes Coordinator [phone]

## 2014-05-09 NOTE — Progress Notes (Signed)
HO Chiu paged, pt vomited back up bowel prep, and currently refusing enema, just making him aware, asked to call if any other intervention. Patient states she will try the enema later tonight.

## 2014-05-09 NOTE — Progress Notes (Signed)
Patient ID: Rachel Gonzalez, female   DOB: Feb 24, 1951, 64 y.o.   MRN: [id number] Postoperative day 1 right foot first ray amputation. Tissue cultures and wound cultures were obtained interoperatively. Patient had a large abscess with necrotic tissue involving the entire first ray. The dorsal skin flap was extremely thin but with minimize weightbearing for 2 weeks patient should have a good chance for wound healing. Discussed the importance of establishing a physician for diabetic care. Recommend continue IV antibiotics for 48 hours postoperatively and then patient may be discharged to home when safe with ambulation.

## 2014-05-10 LAB — BASIC METABOLIC PANEL
Anion gap: 9 (ref 5–15)
BUN: 27 mg/dL — ABNORMAL HIGH (ref 6–23)
CHLORIDE: 106 mmol/L (ref 96–112)
CO2: 24 mmol/L (ref 19–32)
Calcium: 8.5 mg/dL (ref 8.4–10.5)
Creatinine, Ser: 1.59 mg/dL — ABNORMAL HIGH (ref 0.50–1.10)
GFR calc non Af Amer: 34 mL/min — ABNORMAL LOW (ref >90)
GFR, EST AFRICAN AMERICAN: 39 mL/min — AB (ref >90)
GLUCOSE: 88 mg/dL (ref 70–99)
POTASSIUM: 4.3 mmol/L (ref 3.5–5.1)
Sodium: 139 mmol/L (ref 135–145)

## 2014-05-10 LAB — GLUCOSE, CAPILLARY
GLUCOSE-CAPILLARY: 86 mg/dL (ref 70–99)
GLUCOSE-CAPILLARY: 136 mg/dL — AB (ref 70–99)
Glucose-Capillary: 332 mg/dL — ABNORMAL HIGH (ref 70–99)
Glucose-Capillary: 133 mg/dL — ABNORMAL HIGH (ref 70–99)
Glucose-Capillary: 122 mg/dL — ABNORMAL HIGH (ref 70–99)

## 2014-05-10 LAB — CBC
HCT: 21.7 % — ABNORMAL LOW (ref 36.0–46.0)
HEMOGLOBIN: 7.1 g/dL — AB (ref 12.0–15.0)
MCH: 26.9 pg (ref 26.0–34.0)
MCHC: 32.7 g/dL (ref 30.0–36.0)
MCV: 82.2 fL (ref 78.0–100.0)
Platelets: 236 10*3/uL (ref 150–400)
RBC: 2.64 MIL/uL — ABNORMAL LOW (ref 3.87–5.11)
RDW: 13.7 % (ref 11.5–15.5)
WBC: 8.1 10*3/uL (ref 4.0–10.5)

## 2014-05-10 MED ORDER — LINACLOTIDE 145 MCG PO CAPS
145.0000 ug | ORAL_CAPSULE | Freq: Every day | ORAL | Status: DC
  Administered 2014-05-10 – 2014-05-16 (×7): 145 ug via ORAL
  Filled 2014-05-10 (×7): qty 1

## 2014-05-10 NOTE — Progress Notes (Signed)
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    PT Comments    Pt progressing slowly towards physical therapy goals. Session was limited by nausea/vomiting and generally feeling poor per pt. Was not able to achieve full standing or ambulate this session. Pt was educated on importance of maintaining TDWB status, and proper positioning while sitting in the chair, as well as benefits of being OOB throughout the day/participating in therapy. Encouraged pt to discuss rehab with her husband - feel that pt is going to need 24 hour assistance and more frequent PT follow-up at d/c than HHPT will provide. Pt reports that she will be home alone for ~12 hours a day while her husband is at work.      Follow Up Recommendations  CIR;Supervision/Assistance - 24 hour     Equipment Recommendations  Rolling walker with 5" wheels;3in1 (PT)    Recommendations for Other Services Rehab consult     Precautions / Restrictions Precautions Precautions: Fall Restrictions Weight Bearing Restrictions: Yes RLE Weight Bearing: Touchdown weight bearing    Mobility  Bed Mobility               General bed mobility comments: Pt sitting up in recliner upon PT arrival.  Transfers Overall transfer level: Needs assistance Equipment used: Rolling walker (2 wheeled) Transfers: Sit to/from Stand           General transfer comment: Pt attempted sit>stand and suddenly became nauseated and sat back down. Pt then vomited very shortly after. Pt was able to demonstrate good scooting and positional changes within the chair.   Ambulation/Gait             General Gait Details: Pt unable at this time.    Stairs            Wheelchair Mobility    Modified Rankin (Stroke Patients Only)       Balance Overall balance assessment: Needs assistance Sitting-balance support: Feet supported;No upper extremity supported Sitting balance-Leahy Scale: Fair                              Cognition Arousal/Alertness: Lethargic;Suspect due to medications Behavior During Therapy: WFL for tasks assessed/performed Overall Cognitive Status: Within Functional Limits for tasks assessed                      Exercises      General Comments        Pertinent Vitals/Pain Pain Assessment: 0-10 Pain Score: 7  Pain Location: R foot Pain Descriptors / Indicators: Tender Pain Intervention(s): Limited activity within patient's tolerance;Monitored during session;Repositioned    Home Living                      Prior Function            PT Goals (current goals can now be found in the care plan section) Acute Rehab PT Goals Patient Stated Goal: Get back to work PT Goal Formulation: With patient Time For Goal Achievement: 05/16/14 Potential to Achieve Goals: Good Progress towards PT goals: Progressing toward  goals    Frequency  Min 4X/week    PT Plan Discharge plan needs to be updated;Frequency needs to be updated    Co-evaluation             End of Session Equipment Utilized During Treatment: Gait belt Activity Tolerance: Patient limited by fatigue Patient left: in chair;with call bell/phone within reach     Time: 1141-1200 PT Time Calculation (min) (ACUTE ONLY): 19 min  Charges:  $Therapeutic Activity: 8-22 mins                    G Codes:      Rachel Gonzalez, Rachel Gonzalez 05/10/2014, 2:07 PM   Rachel SlipperLaura Estephany Gonzalez, PT, DPT Acute Rehabilitation Services Pager: [phone]

## 2014-05-10 NOTE — Progress Notes (Signed)
Pt received an enema on 3/2, had a large bowel movement. Pt now having small bloody stools, paged MD. Awaiting call back, will continue to monitor pt.

## 2014-05-10 NOTE — Progress Notes (Signed)
Rehab Admissions Coordinator Note:  Patient was screened by Trish MageLogue, Rever Pichette M for appropriateness for an Inpatient Acute Rehab Consult.  At this time, we are recommending Skilled Nursing Facility.  Based on current diagnosis, doubt that insurance carrier would authorize an acute inpatient rehab admission.  I will check with the onsite insurance case manager for options.  Trish MageLogue, Elan Brainerd M 05/10/2014, 4:10 PM  I can be reached at [phone].

## 2014-05-10 NOTE — Progress Notes (Signed)
Pt blood pressure was 126/103. Paged MD on-call,awaiting call back. Rechecked bp 163/61. Will continue to monitor.

## 2014-05-10 NOTE — Progress Notes (Signed)
[facility] PROGRESS NOTE  GURTHA PICKER ZOX:[id number] DOB: 1950-10-18 DOA: 05/06/2014 PCP: Pcp Not In System  Assessment/Plan: 1. Diabetic R foot with sepsis 1. Presenting fevers with tachycardia 2. No longer febrile and without leukocytosis 3. Pt is s/p surgery on 3/1 4. Surgery recs for continued IV abx x 48hrs post-operatively 2. ARF 1. Renal function improving 2. Continue to monitor 3. UA with protein count of 30 with concerns for diabetic nephropathy 3. Uncontrolled DM 1. A1c of over 16 2. Following recs by Diabetic Coordinator 3. Again counseled patient about importance of tight glycemic control. Pt states she understands 4. Anemia 1. Hgb holding steady 2. Follow cbc and transfuse if hgb <7 3. Have stopped heparin and started SCD's for DVT prophylaxis 5. Diabetic Neuropathy 1. Per above 2. Goal is tight glycemic control 6. HTN 1. Stable. Tolerating beta blocker 2. For now, continue to avoid ACEI secondary to renal insufficency 3. Would consider transitioning to ACEI when/if renal function improves 7. Obesity 1. Counseled 8. DVT prophylxis 1. SCD's' 9. Constipation 1. Large amount of stool noted on imaging with large amount of stool noted after SMOG 2. Linzess started 3. Continue with cathartics as needed to promote BM 4. 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  Antibiotics:  vanc  zosyn (indicate start date, and stop date if known)  HPI/Subjective: Reports feeling better after large BM overnight, but still feels "tight."  Objective: Filed Vitals:   05/10/14 0601 05/10/14 0639 05/10/14 1031 05/10/14 1407  BP: 163/61 154/60 122/45 136/58  Pulse: 93  93 62  Temp: 98.8 F (37.1 C)   98.7 F (37.1 C)  TempSrc:     Oral  Resp: 18   18  Height:      Weight:      SpO2: 96%   95%    Intake/Output Summary (Last 24 hours) at 05/10/14 1748 Last data filed at 05/10/14 1452  Gross per 24 hour  Intake   1060 ml  Output   1600 ml  Net   -540 ml   Filed Weights   05/06/14 1301  Weight: 83.915 kg (185 lb)    Exam:   General:  Awake, laying in bed, in nad  Cardiovascular: regular, s1, s2  Respiratory: normal resp effort, no wheezing  Abdomen: obese, decreased BS, distended but improved from yesterday  Musculoskeletal: perfused, no clubbing   Data Reviewed: Basic Metabolic Panel:  Recent Labs Lab 05/06/14 1328 05/06/14 2040 05/07/14 0528 05/08/14 0607 05/09/14 0542 05/10/14 0515  NA 135  --  136 134* 132* 139  K 4.7  --  4.6 5.1 5.0 4.3  CL 101  --  103 99 103 106  CO2 27  --  GLUCOSE 447*  --  333* 379* 410* 88  BUN 19  --  21 30* 32* 27*  CREATININE 1.19*  --  1.69* 2.23* 1.87* 1.59*  CALCIUM 9.1  --  8.5 8.3* 8.0* 8.5  MG  --  1.9  --   --   --   --   PHOS  --  2.5  --   --   --   --    Liver Function Tests:  Recent Labs Lab 05/06/14 2040  AST 13  ALT 13  ALKPHOS  122*  BILITOT 0.6  PROT 6.6  ALBUMIN 2.9*   No results for input(s): LIPASE, AMYLASE in the last 168 hours. No results for input(s): AMMONIA in the last 168 hours. CBC:  Recent Labs Lab 05/07/14 0528 05/08/14 0607 05/09/14 0542 05/09/14 1600 05/10/14 0515  WBC 8.5 9.3 8.3 8.2 8.1  HGB 7.9* 7.8* 7.0* 7.1* 7.1*  HCT 24.7* 24.2* 21.8* 21.7* 21.7*  MCV 81.3 82.9 81.6 82.8 82.2  PLT 217 210 214 228 236   Cardiac Enzymes: No results for input(s): CKTOTAL, CKMB, CKMBINDEX, TROPONINI in the last 168 hours. BNP (last 3 results) No results for input(s): BNP in the last 8760 hours.  ProBNP (last 3 results) No results for input(s): PROBNP in the last 8760 hours.  CBG:  Recent Labs Lab 05/09/14 1723 05/09/14 2114 05/10/14 0816 05/10/14 1219 05/10/14 1709  GLUCAP 149* 119* 86 133*  136*    Recent Results (from the past 240 hour(s))  Culture, blood (routine x 2)     Status: None (Preliminary result)   Collection Time: 05/06/14  5:04 PM  Result Value Ref Range Status   Specimen Description BLOOD RIGHT ARM  Final   Special Requests BOTTLES DRAWN AEROBIC AND ANAEROBIC  Final   Culture   Final           BLOOD CULTURE RECEIVED NO GROWTH TO DATE CULTURE WILL BE HELD FOR 5 DAYS BEFORE ISSUING A FINAL NEGATIVE REPORT Performed at [facility]    Report Status PENDING  Incomplete  Culture, blood (routine x 2)     Status: None (Preliminary result)   Collection Time: 05/06/14  5:07 PM  Result Value Ref Range Status   Specimen Description BLOOD LEFT ARM  Final   Special Requests BOTTLES DRAWN AEROBIC AND ANAEROBIC 10CC  Final   Culture   Final           BLOOD CULTURE RECEIVED NO GROWTH TO DATE CULTURE WILL BE HELD FOR 5 DAYS BEFORE ISSUING A FINAL NEGATIVE REPORT Performed at [facility]    Report Status PENDING  Incomplete  Anaerobic culture     Status: None (Preliminary result)   Collection Time: 05/08/14  5:59 PM  Result Value Ref Range Status   Specimen Description TISSUE RIGHT FOOT  Final   Special Requests PATIENT ON FOLLOWING VANCOMYCIN PART B  Final   Gram Stain PENDING  Incomplete   Culture   Final    NO ANAEROBES ISOLATED; CULTURE IN PROGRESS FOR 5 DAYS Performed at [facility]    Report Status PENDING  Incomplete  Tissue culture     Status: None (Preliminary result)   Collection Time: 05/08/14  5:59 PM  Result Value Ref Range Status   Specimen Description TISSUE RIGHT FOOT  Final   Special Requests PATIENT ON FOLLOWING VANCOMYCIN PART B  Final   Gram Stain   Final    NO WBC SEEN NO SQUAMOUS EPITHELIAL CELLS SEEN FEW GRAM POSITIVE COCCI IN PAIRS Performed at [facility]    Culture   Final    Culture reincubated for better growth Performed at [facility]    Report Status PENDING  Incomplete   Culture, routine-abscess     Status: None (Preliminary result)   Collection Time: 05/08/14  6:05 PM  Result Value Ref Range Status   Specimen Description ABSCESS RIGHT FOOT  Final   Special Requests PATIENT ON FOLLOWING VANCOMYCIN PART A  Final   Gram Stain   Final  RARE WBC PRESENT, PREDOMINANTLY PMN NO SQUAMOUS EPITHELIAL CELLS SEEN RARE GRAM POSITIVE COCCI IN PAIRS [facility]formed at [facility]    Culture   Final    MULTIPLE ORGANISMS PRESENT, NONE PREDOMINANT Performed at [facility]    Report Status PENDING  Incomplete     Studies: Dg Abd Portable 1v  05/09/2014   CLINICAL DATA:  Initial encounter for one week history of constipation and loss of appetite.  EXAM: PORTABLE ABDOMEN - 1 VIEW  COMPARISON:  None.  FINDINGS: There is a paucity of bowel gas. No radiographic evidence to suggest obstruction. No unexpected abdominal pelvic calcification. The visualized bony anatomy is unremarkable.  IMPRESSION: No evidence for bowel obstruction.   Electronically Signed   By: Kennith CenterEric  Mansell M.D.   On: 05/09/2014 19:06    Scheduled Meds: . docusate sodium  100 mg Oral BID  . feeding supplement (PRO-STAT SUGAR FREE 64)  30 mL Oral BID  . ferrous sulfate  325 mg Oral Q breakfast  . gabapentin  100 mg Oral TID  . insulin aspart  0-20 Units Subcutaneous TID WC  . insulin aspart  10 Units Subcutaneous TID WC  . insulin detemir  30 Units Subcutaneous QHS  . isosorbide-hydrALAZINE  1 tablet Oral BID  . Linaclotide  145 mcg Oral Daily  . metoprolol tartrate  12.5 mg Oral BID  . pantoprazole  40 mg Oral Q1200  . piperacillin-tazobactam (ZOSYN)  IV  3.375 g Intravenous Q8H   Continuous Infusions: . sodium chloride 100 mL/hr at 05/10/14 0115  . sodium chloride 10 mL/hr at 05/08/14 1700  . sodium chloride      Principal Problem:   Cellulitis of right lower extremity Active Problems:   HTN (hypertension)   Cellulitis   Uncontrolled diabetes mellitus   Anemia   Diabetic  neuropathy, type II diabetes mellitus   Obesity (BMI 30-39.9)   Cellulitis of right foot   CHIU, STEPHEN K  [facility] Pager [phone]. If 7PM-7AM, please contact night-coverage at [email or web address], password [email or web address] 05/10/2014, 5:48 PM  LOS: 4 days

## 2014-05-11 LAB — COMPREHENSIVE METABOLIC PANEL
ALT: 21 U/L (ref 0–35)
AST: 37 U/L (ref 0–37)
Albumin: 2.2 g/dL — ABNORMAL LOW (ref 3.5–5.2)
Alkaline Phosphatase: 106 U/L (ref 39–117)
Anion gap: 8 (ref 5–15)
BILIRUBIN TOTAL: 0.4 mg/dL (ref 0.3–1.2)
BUN: 26 mg/dL — AB (ref 6–23)
CALCIUM: 8.3 mg/dL — AB (ref 8.4–10.5)
CO2: 23 mmol/L (ref 19–32)
Chloride: 108 mmol/L (ref 96–112)
Creatinine, Ser: 1.71 mg/dL — ABNORMAL HIGH (ref 0.50–1.10)
GFR calc non Af Amer: 31 mL/min — ABNORMAL LOW (ref >90)
GFR, EST AFRICAN AMERICAN: 36 mL/min — AB (ref >90)
Glucose, Bld: 106 mg/dL — ABNORMAL HIGH (ref 70–99)
Potassium: 4.2 mmol/L (ref 3.5–5.1)
Sodium: 139 mmol/L (ref 135–145)
Total Protein: 5.7 g/dL — ABNORMAL LOW (ref 6.0–8.3)

## 2014-05-11 LAB — CBC
HCT: 20.5 % — ABNORMAL LOW (ref 36.0–46.0)
HEMOGLOBIN: 6.6 g/dL — AB (ref 12.0–15.0)
MCH: 26.7 pg (ref 26.0–34.0)
MCHC: 32.2 g/dL (ref 30.0–36.0)
MCV: 83 fL (ref 78.0–100.0)
PLATELETS: 246 10*3/uL (ref 150–400)
RBC: 2.47 MIL/uL — ABNORMAL LOW (ref 3.87–5.11)
RDW: 14 % (ref 11.5–15.5)
WBC: 9.6 10*3/uL (ref 4.0–10.5)

## 2014-05-11 LAB — HEMOGLOBIN AND HEMATOCRIT, BLOOD
HCT: 24.8 % — ABNORMAL LOW (ref 36.0–46.0)
HEMOGLOBIN: 8.1 g/dL — AB (ref 12.0–15.0)

## 2014-05-11 LAB — GLUCOSE, CAPILLARY
GLUCOSE-CAPILLARY: 62 mg/dL — AB (ref 70–99)
GLUCOSE-CAPILLARY: 89 mg/dL (ref 70–99)
Glucose-Capillary: 76 mg/dL (ref 70–99)
Glucose-Capillary: 131 mg/dL — ABNORMAL HIGH (ref 70–99)
Glucose-Capillary: 86 mg/dL (ref 70–99)
Glucose-Capillary: 131 mg/dL — ABNORMAL HIGH (ref 70–99)

## 2014-05-11 LAB — PREPARE RBC (CROSSMATCH)

## 2014-05-11 LAB — TISSUE CULTURE: Gram Stain: NONE SEEN

## 2014-05-11 MED ORDER — SODIUM CHLORIDE 0.9 % IV SOLN
Freq: Once | INTRAVENOUS | Status: AC
  Administered 2014-05-11: 12:00:00 via INTRAVENOUS

## 2014-05-11 MED ORDER — SODIUM CHLORIDE 0.9 % IV SOLN: Freq: Once | INTRAVENOUS | Status: AC

## 2014-05-11 NOTE — Care Management Note (Signed)
    Page 1 of 1   05/16/2014     12:16:07 PM CARE MANAGEMENT NOTE 05/16/2014  Patient:  Rachel Gonzalez,Rachel Gonzalez   Account Number:  [id number]  Date Initiated:  05/11/2014  Documentation initiated by:  Letha CapeAYLOR,Rebbeca Sheperd  Subjective/Objective Assessment:   dx cellulitis, abscess right foot  admit- lives with spouse.     Action/Plan:   Anticipated DC Date:  05/16/2014   Anticipated DC Plan:  SKILLED NURSING FACILITY  In-house referral  Clinical Social Worker      DC Planning Services  CM consult      Choice offered to / List presented to:             Status of service:  Completed, signed off Medicare Important Message given?  NO (If response is "NO", the following Medicare IM given date fields will be blank) Date Medicare IM given:   Medicare IM given by:   Date Additional Medicare IM given:   Additional Medicare IM given by:    Discharge Disposition:  SKILLED NURSING FACILITY  Per UR Regulation:  Reviewed for med. necessity/level of care/duration of stay  If discussed at Long Length of Stay Meetings, dates discussed:    Comments:  05/16/14 1215 Letha Capeeborah Jacarie Pate RN, BSN [phone] patient is for dc to snf today.  05/11/14 1647 Letha Capeeborah Merril Isakson RN, BSN [phone] NCM spoke with patient, she was not sure of what hh agency she wanted to work with NCM left agency list with patient, then phsycal therapy rec CIR  and not physical therapy is rec snf for patient.

## 2014-05-11 NOTE — Progress Notes (Signed)
Hypoglycemic Event  CBG: 72  Treatment: 15 GM carbohydrate snack  Symptoms: None  Follow-up CBG: Time:0927 CBG Result:89  Possible Reasons for Event: Inadequate meal intake  Comments/MD notified:Dr Marcelino Scothiu    Jazyiah Yiu, Heywood IlesJesse Garret  Remember to initiate Hypoglycemia Order Set & complete

## 2014-05-11 NOTE — Clinical Social Work Note (Signed)
Clinical Social Work Department BRIEF PSYCHOSOCIAL ASSESSMENT 05/11/2014  Patient:  Rachel Gonzalez, Rachel Gonzalez     Account Number:  [id number]     [place] date:  05/06/2014  Clinical Social Worker:  Myles Lipps  Date/Time:  05/11/2014 05:00 PM  Referred by:  Physician  Date Referred:  05/11/2014 Referred for  SNF Placement   Other Referral:   Interview type:  Patient Other interview type:   No family/friends present at bedside    PSYCHOSOCIAL DATA Living Status:  HUSBAND Admitted from facility:   Level of care:   Primary support name:  Rachel Gonzalez, Rachel Gonzalez  [phone] Primary support relationship to patient:  SPOUSE Degree of support available:   Strong    CURRENT CONCERNS Current Concerns  Post-Acute Placement   Other Concerns:    SOCIAL WORK ASSESSMENT / PLAN Clinical Social Worker met with patient at bedside to offer support and discuss patient needs at discharge.  Patient states that she currently lives at home with her husband but is agreeable that she will have rehab needs prior to return home.  Patient states that they reside in [place] and have preference to [facility].  CSW has initiated referral to [facility] and will follow up with patient with available bed offers.  CSW remains available for support and to facilitate patient discharge needs once medically stable.   Assessment/plan status:  Psychosocial Support/Ongoing Assessment of Needs Other assessment/ plan:   Information/referral to community resources:   CSW to provide patient with facility list once offers are available    PATIENT'S/FAMILY'S RESPONSE TO PLAN OF CARE: Patient alert and oriented x3 sitting up in the chair. Patient very engaged in conversation and agreeable for SNF placement at discharge.  Patient states that she is hopeful for a short term placement and return home with her husband.  Patient verbalized understanding of CSW role and appreciation for support and concern.

## 2014-05-11 NOTE — Progress Notes (Signed)
CRITICAL VALUE ALERT  Critical value received:  HBG 6.6  Date of notification:  05/11/14  Time of notification: 0648  Critical value read back:Yes.    Nurse who received alert:  PAM Daphine DeutscherMARTIN RN  MD notified (1st page): TOM CALLAHAN  Time of first page:  [phone]  MD notified (2nd page):  Time of second page:  Responding MD:Tom Claiborne Billingsallahan  Time MD responded:  [phone]

## 2014-05-11 NOTE — Progress Notes (Signed)
[facility] PROGRESS NOTE  ANALYCIA KHOKHAR ZOX:[id number] DOB: 1950-07-17 DOA: 05/06/2014 PCP: Pcp Not In System  Assessment/Plan: 1. Diabetic R foot with sepsis 1. Presenting fevers with tachycardia 2. No longer febrile with no leukocytosis 3. Pt is s/p surgery on 3/1 4. Surgery recs for continued IV abx 2. ARF 1. Continue to monitor 2. UA with protein count of 30 with concerns for diabetic nephropathy 3. Cr stable thus far 4. Follow renal function 3. Uncontrolled DM 1. A1c of over 16 on presentation 2. Following recs by Diabetic Coordinator 3. Again counseled patient about importance of tight glycemic control. Pt states she understands 4. Anemia 1. Hgb of 6.6 today 2. Off of prophylactic anticoagulation 3. Pt receiving 1 unit of PRBC's 5. Diabetic Neuropathy 1. Per above 2. Goal is tight glycemic control 6. HTN 1. Stable. Tolerating beta blocker 2. For now, continue to avoid ACEI secondary to renal insufficency 3. Would consider transitioning to ACEI when/if renal function improves 7. Obesity 1. Counseled 8. DVT prophylxis 1. SCD's' 9. Constipation 1. Large amount of stool noted on imaging with large amount of stool noted after SMOG 2. Linzess started 3. Pt continues with multiple large BM overnight 4. 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   Objective: Filed Vitals:   05/10/14 2143 05/11/14 0537 05/11/14 1149 05/11/14 1216  BP: 142/64 102/54 158/72 162/73  Pulse: 69  89 91  Temp: 98.6 F (37 C) 99.4 F (37.4 C) 98.4 F (36.9 C) 98.2 F (36.8 C)  TempSrc: Oral Axillary Oral Oral  Resp:  Height:      Weight:      SpO2: 96% 94% 94% 97%    Intake/Output Summary (Last 24 hours) at 05/11/14 1502 Last data  filed at 05/11/14 1447  Gross per 24 hour  Intake   1920 ml  Output    752 ml  Net   1168 ml   Filed Weights   05/06/14 1301  Weight: 83.915 kg (185 lb)    Exam:   General:  Awake, laying in bed, in nad  Cardiovascular: regular, s1, s2  Respiratory: normal resp effort, no wheezing  Abdomen: obese, decreased BS, nondistended  Musculoskeletal: perfused, no clubbing   Data Reviewed: Basic Metabolic Panel:  Recent Labs Lab 05/06/14 2040 05/07/14 0528 05/08/14 0607 05/09/14 0542 05/10/14 0515 05/11/14 0422  NA  --  136 134* 132* 139 139  K  --  4.6 5.1 5.0 4.3 4.2  CL  --  103 99 103 106 108  CO2  --  GLUCOSE  --  333* 379* 410* 88 106*  BUN  --  21 30* 32* 27* 26*  CREATININE  --  1.69* 2.23* 1.87* 1.59* 1.71*  CALCIUM  --  8.5 8.3* 8.0* 8.5 8.3*  MG 1.9  --   --   --   --   --   PHOS 2.5  --   --   --   --   --    Liver Function Tests:  Recent Labs Lab 05/06/14 2040 05/11/14 0422  AST 13 37  ALT 13 21  ALKPHOS 122* 106  BILITOT 0.6 0.4  PROT 6.6 5.7*  ALBUMIN 2.9* 2.2*   No results for input(s):  LIPASE, AMYLASE in the last 168 hours. No results for input(s): AMMONIA in the last 168 hours. CBC:  Recent Labs Lab 05/08/14 0607 05/09/14 0542 05/09/14 1600 05/10/14 0515 05/11/14 0422  WBC 9.3 8.3 8.2 8.1 9.6  HGB 7.8* 7.0* 7.1* 7.1* 6.6*  HCT 24.2* 21.8* 21.7* 21.7* 20.5*  MCV 82.9 81.6 82.8 82.2 83.0  PLT 210 214 228 236 246   Cardiac Enzymes: No results for input(s): CKTOTAL, CKMB, CKMBINDEX, TROPONINI in the last 168 hours. BNP (last 3 results) No results for input(s): BNP in the last 8760 hours.  ProBNP (last 3 results) No results for input(s): PROBNP in the last 8760 hours.  CBG:  Recent Labs Lab 05/10/14 2235 05/11/14 0630 05/11/14 0809 05/11/14 0927 05/11/14 1216  GLUCAP 122* 76 62* 89 131*    Recent Results (from the past 240 hour(s))  Culture, blood (routine x 2)     Status: None (Preliminary result)    Collection Time: 05/06/14  5:04 PM  Result Value Ref Range Status   Specimen Description BLOOD RIGHT ARM  Final   Special Requests BOTTLES DRAWN AEROBIC AND ANAEROBIC 10ML  Final   Culture   Final           BLOOD CULTURE RECEIVED NO GROWTH TO DATE CULTURE WILL BE HELD FOR 5 DAYS BEFORE ISSUING A FINAL NEGATIVE REPORT Performed at [facility]    Report Status PENDING  Incomplete  Culture, blood (routine x 2)     Status: None (Preliminary result)   Collection Time: 05/06/14  5:07 PM  Result Value Ref Range Status   Specimen Description BLOOD LEFT ARM  Final   Special Requests BOTTLES DRAWN AEROBIC AND ANAEROBIC 10CC  Final   Culture   Final           BLOOD CULTURE RECEIVED NO GROWTH TO DATE CULTURE WILL BE HELD FOR 5 DAYS BEFORE ISSUING A FINAL NEGATIVE REPORT Performed at [facility]    Report Status PENDING  Incomplete  Anaerobic culture     Status: None (Preliminary result)   Collection Time: 05/08/14  5:59 PM  Result Value Ref Range Status   Specimen Description TISSUE RIGHT FOOT  Final   Special Requests PATIENT ON FOLLOWING VANCOMYCIN PART B  Final   Gram Stain PENDING  Incomplete   Culture   Final    NO ANAEROBES ISOLATED; CULTURE IN PROGRESS FOR 5 DAYS Performed at [facility]    Report Status PENDING  Incomplete  Tissue culture     Status: None   Collection Time: 05/08/14  5:59 PM  Result Value Ref Range Status   Specimen Description TISSUE RIGHT FOOT  Final   Special Requests PATIENT ON FOLLOWING VANCOMYCIN PART B  Final   Gram Stain   Final    NO WBC SEEN NO SQUAMOUS EPITHELIAL CELLS SEEN FEW GRAM POSITIVE COCCI IN PAIRS Performed at [facility]    Culture   Final    MULTIPLE ORGANISMS PRESENT, NONE PREDOMINANT Note: NO STAPHYLOCOCCUS AUREUS ISOLATED NO GROUP A STREP (S.PYOGENES) ISOLATED Performed at [facility]    Report Status 05/11/2014 FINAL  Final  Culture, routine-abscess     Status: None (Preliminary result)    Collection Time: 05/08/14  6:05 PM  Result Value Ref Range Status   Specimen Description ABSCESS RIGHT FOOT  Final   Special Requests PATIENT ON FOLLOWING VANCOMYCIN PART A  Final   Gram Stain   Final    RARE WBC PRESENT,  PREDOMINANTLY PMN NO SQUAMOUS EPITHELIAL CELLS SEEN RARE GRAM POSITIVE COCCI IN PAIRS Performed at [facility]    Culture   Final    MULTIPLE ORGANISMS PRESENT, NONE PREDOMINANT Performed at [facility]    Report Status PENDING  Incomplete     Studies: Dg Abd Portable 1v  05/09/2014   CLINICAL DATA:  Initial encounter for one week history of constipation and loss of appetite.  EXAM: PORTABLE ABDOMEN - 1 VIEW  COMPARISON:  None.  FINDINGS: There is a paucity of bowel gas. No radiographic evidence to suggest obstruction. No unexpected abdominal pelvic calcification. The visualized bony anatomy is unremarkable.  IMPRESSION: No evidence for bowel obstruction.   Electronically Signed   By: Kennith Center M.D.   On: 05/09/2014 19:06    Scheduled Meds: . docusate sodium  100 mg Oral BID  . feeding supplement (PRO-STAT SUGAR FREE 64)  30 mL Oral BID  . ferrous sulfate  325 mg Oral Q breakfast  . gabapentin  100 mg Oral TID  . insulin aspart  0-20 Units Subcutaneous TID WC  . insulin aspart  10 Units Subcutaneous TID WC  . insulin detemir  30 Units Subcutaneous QHS  . isosorbide-hydrALAZINE  1 tablet Oral BID  . Linaclotide  145 mcg Oral Daily  . metoprolol tartrate  12.5 mg Oral BID  . pantoprazole  40 mg Oral Q1200   Continuous Infusions: . sodium chloride 100 mL/hr at 05/10/14 0115  . sodium chloride 10 mL/hr at 05/08/14 1700  . sodium chloride      Principal Problem:   Cellulitis of right lower extremity Active Problems:   HTN (hypertension)   Cellulitis   Uncontrolled diabetes mellitus   Anemia   Diabetic neuropathy, type II diabetes mellitus   Obesity (BMI 30-39.9)   Cellulitis of right foot   Flem Enderle K  [facility] Pager [phone]. If 7PM-7AM, please contact night-coverage at [email or web address], password [facility] 05/11/2014, 3:02 PM  LOS: 5 days

## 2014-05-11 NOTE — Clinical Social Work Placement (Addendum)
Clinical Social Work Department CLINICAL SOCIAL WORK PLACEMENT NOTE 05/11/2014  Patient:  Rachel Gonzalez,Elon A  Account Number:  [id number] Admit date:  05/06/2014  Clinical Social Worker:  Macario GoldsJESSE Drayden Lukas, LCSW  Date/time:  05/11/2014 05:30 PM  Clinical Social Work is seeking post-discharge placement for this patient at the following level of care:   SKILLED NURSING   (*CSW will update this form in Epic as items are completed)   05/11/2014  Patient/family provided with [facility] of Clinical Social Work's list of facilities offering this level of care within the geographic area requested by the patient (or if unable, by the patient's family).  05/11/2014  Patient/family informed of their freedom to choose among providers that offer the needed level of care, that participate in Medicare, Medicaid or managed care program needed by the patient, have an available bed and are willing to accept the patient.  05/11/2014  Patient/family informed of [facility]' ownership interest in [facility], as well as of the fact that they are under no obligation to receive care at this facility.  PASARR submitted to EDS on 05/10/2014 PASARR number received on 05/10/2014  FL2 transmitted to all facilities in geographic area requested by pt/family on  05/11/2014 FL2 transmitted to all facilities within larger geographic area on   Patient informed that his/her managed care company has contracts with or will negotiate with  certain facilities, including the following:     Patient/family informed of bed offers received: 05/15/2014  [facility] Physician recommends and patient chooses bed at    [facility]   [facility] and family notified of transfer on 05/16/2014 Name of family member notified:  Patient husband over the phone  The following physician request were entered  in Epic:   Additional Comments:

## 2014-05-11 NOTE — Progress Notes (Signed)
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    PT Comments    Progressing slowly, suspect some self initiation problems.  Emphasis on mobilizing in the bed to EOB, standing with TDWB on R LE and Transfer training.  Pt not ready to try "swing to " gait yet.  Still has trouble maintaining TDWB.   Follow Up Recommendations  SNF (CIR has been denied)     Equipment Recommendations  Rolling walker with 5" wheels;3in1 (PT)    Recommendations for Other Services       Precautions / Restrictions Precautions Precautions: Fall Restrictions RLE Weight Bearing: Touchdown weight bearing    Mobility  Bed Mobility Overal bed mobility: Needs Assistance Bed Mobility: Supine to Sit     Supine to sit: Min assist     General bed mobility comments: assisted to bridge to EOB and truncal assist to sit up.  Transfers Overall transfer level: Needs assistance Equipment used: Rolling walker (2 wheeled);None Transfers: Sit to/from [employer] Sit to Stand: Min assist;From elevated surface (x3)   Squat pivot transfers: Min assist     General transfer comment: emphasis on TDWB on R and general transfer safety  Ambulation/Gait             General Gait Details: Pt unable at this time.    Stairs            Wheelchair Mobility    Modified Rankin (Stroke Patients Only)       Balance Overall balance assessment: Needs assistance Sitting-balance support: No upper extremity supported Sitting balance-Leahy Scale: Fair Sitting balance - Comments: easier time with balance and able to scoot to EOB  easily   Standing balance support: Bilateral upper extremity supported Standing balance-Leahy Scale: Poor Standing balance comment: stood in RW x3 working to maintain standing on L LE, with R foot withrdrawn away from the floor. lifting her weight up off the floor to simulate hopping.                    Cognition Arousal/Alertness: Awake/alert Behavior During Therapy: WFL for tasks assessed/performed Overall Cognitive Status: Within Functional Limits for tasks assessed                      Exercises General Exercises - Lower Extremity Hip Flexion/Marching: AAROM;Strengthening;10 reps;Supine (graded resistance) Other Exercises Other Exercises: bicep/tricep presses with should assist x 10 reps resisted.    General Comments General comments (skin integrity, edema, etc.): pt very stiff and sore as if she has little self-initiation to move.  Discussed that it was time she started moving her extremities and positioning in the bed more often.      Pertinent Vitals/Pain Pain Assessment: Faces Faces Pain Scale: Hurts even more Pain Location: back pain with movement Pain Descriptors / Indicators: Aching;Grimacing Pain Intervention(s): Limited activity within patient's tolerance;Repositioned    Home Living                      Prior Function            PT Goals (current goals can now be found in  the care plan section) Acute Rehab PT Goals Patient Stated Goal: Get back to work PT Goal Formulation: With patient Time For Goal Achievement: 05/16/14 Potential to Achieve Goals: Good Progress towards PT goals: Progressing toward goals    Frequency       PT Plan Current plan remains appropriate    Co-evaluation             End of Session   Activity Tolerance: Patient limited by fatigue;Patient tolerated treatment well Patient left: in chair;with call bell/phone within reach     Time: 1246-1322 PT Time Calculation (min) (ACUTE ONLY): 36  min  Charges:  $Therapeutic Activity: 23-37 mins                    G Codes:      Adom Schoeneck, Eliseo GumKenneth V 05/11/2014, 2:15 PM

## 2014-05-12 LAB — BASIC METABOLIC PANEL
ANION GAP: 7 (ref 5–15)
BUN: 27 mg/dL — AB (ref 6–23)
CO2: 21 mmol/L (ref 19–32)
Calcium: 8.2 mg/dL — ABNORMAL LOW (ref 8.4–10.5)
Chloride: 109 mmol/L (ref 96–112)
Creatinine, Ser: 1.75 mg/dL — ABNORMAL HIGH (ref 0.50–1.10)
GFR calc non Af Amer: 30 mL/min — ABNORMAL LOW (ref >90)
GFR, EST AFRICAN AMERICAN: 35 mL/min — AB (ref >90)
GLUCOSE: 91 mg/dL (ref 70–99)
Potassium: 4 mmol/L (ref 3.5–5.1)
SODIUM: 137 mmol/L (ref 135–145)

## 2014-05-12 LAB — TYPE AND SCREEN
ABO/RH(D): O NEG
ANTIBODY SCREEN: NEGATIVE
Unit division: 0

## 2014-05-12 LAB — CBC
HCT: 22.8 % — ABNORMAL LOW (ref 36.0–46.0)
Hemoglobin: 7.4 g/dL — ABNORMAL LOW (ref 12.0–15.0)
MCH: 26.8 pg (ref 26.0–34.0)
MCHC: 32.5 g/dL (ref 30.0–36.0)
MCV: 82.6 fL (ref 78.0–100.0)
PLATELETS: 298 10*3/uL (ref 150–400)
RBC: 2.76 MIL/uL — AB (ref 3.87–5.11)
RDW: 14 % (ref 11.5–15.5)
WBC: 11.4 10*3/uL — ABNORMAL HIGH (ref 4.0–10.5)

## 2014-05-12 LAB — CULTURE, ROUTINE-ABSCESS

## 2014-05-12 LAB — GLUCOSE, CAPILLARY
Glucose-Capillary: 58 mg/dL — ABNORMAL LOW (ref 70–99)
Glucose-Capillary: 91 mg/dL (ref 70–99)
Glucose-Capillary: 116 mg/dL — ABNORMAL HIGH (ref 70–99)
Glucose-Capillary: 147 mg/dL — ABNORMAL HIGH (ref 70–99)
Glucose-Capillary: 261 mg/dL — ABNORMAL HIGH (ref 70–99)

## 2014-05-12 MED ORDER — INSULIN DETEMIR 100 UNIT/ML ~~LOC~~ SOLN
28.0000 [IU] | Freq: Every day | SUBCUTANEOUS | Status: DC
  Administered 2014-05-12 – 2014-05-13 (×2): 28 [IU] via SUBCUTANEOUS
  Filled 2014-05-12 (×3): qty 0.28

## 2014-05-12 MED ORDER — FUROSEMIDE 10 MG/ML IJ SOLN
40.0000 mg | Freq: Once | INTRAMUSCULAR | Status: AC
  Administered 2014-05-12: 40 mg via INTRAVENOUS
  Filled 2014-05-12: qty 4

## 2014-05-12 NOTE — Progress Notes (Signed)
[facility] PROGRESS NOTE  Rachel Gonzalez ZOX:[id number] DOB: 1951/02/07 DOA: 05/06/2014 PCP: Pcp Not In System  Assessment/Plan: 1. Diabetic R foot with sepsis 1. Presenting fevers with tachycardia 2. No longer febrile with no leukocytosis 3. Pt is s/p surgery on 3/1 4. Surgery recs for continued IV abx 2. ARF 1. Continue to monitor 2. UA with protein count of 30 with concerns for diabetic nephropathy 3. Cr is worsening 4. Pt seems mildly volume overloaded with pitting LE edema. Denies sob 5. Will hold further IVF and will give one dose of lasix 3. Uncontrolled DM 1. A1c of over 16 on presentation 2. Following recs by Diabetic Coordinator 3. Again counseled patient about importance of tight glycemic control. Pt states she understands 4. Glucose noted to be 58 this AM. 5. Will decrease levemir to 28 units from 30 units 4. Anemia 1. Hgb of 6.6 on 3/4 2. Off of prophylactic anticoagulation 3. Pt is s/p one unit of PRBC with appropriate correction of hgb 5. Diabetic Neuropathy 1. Per above 2. Goal is tight glycemic control 6. HTN 1. Stable. Tolerating beta blocker 2. For now, continue to avoid ACEI secondary to renal insufficency 3. Would consider transitioning to ACEI when/if renal function improves 7. Obesity 1. Counseled 8. DVT prophylxis 1. SCD's' 9. Constipation 1. Large amount of stool noted on recent imaging with large amount of stool noted after SMOG 2. Linzess was started 3. Now able to tolerate PO intake, was unable to prior to SMOG 4. Pt reports lower quadrant discomfort and states she feels she is "still full" 5. Patient agreeable to soap suds enema  Code Status: Full Family Communication: Pt in room Disposition Plan: Pending   Consultants:  Orthopedic Surgery  Procedures:  Partial foot amputation 3/1  Antibiotics:  vanc  zosyn   HPI/Subjective: Reports lower quadrant fullness.   Objective: Filed Vitals:   05/11/14 1500 05/11/14 2135  05/12/14 0636 05/12/14 1517  BP: 127/68 165/74 133/59 128/98  Pulse: 71 88  95  Temp: 98.2 F (36.8 C) 98.1 F (36.7 C) 98.9 F (37.2 C) 100.1 F (37.8 C)  TempSrc: Oral Oral Oral Oral  Resp: 16 16 17 20   Height:      Weight:      SpO2: 95% 100% 92% 93%    Intake/Output Summary (Last 24 hours) at 05/12/14 1824 Last data filed at 05/12/14 1556  Gross per 24 hour  Intake 2171.67 ml  Output    701 ml  Net 1470.67 ml   Filed Weights   05/06/14 1301  Weight: 83.915 kg (185 lb)    Exam:   General:  Awake, sitting in chair, in nad  Cardiovascular: regular, s1, s2  Respiratory: normal resp effort, no wheezing  Abdomen: obese, decreased BS, nondistended  Musculoskeletal: perfused, no clubbing   Data Reviewed: Basic Metabolic Panel:  Recent Labs Lab 05/06/14 2040  05/08/14 0607 05/09/14 0542 05/10/14 0515 05/11/14 0422 05/12/14 0333  NA  --   < > 134* 132* 139 139 137  K  --   < > 5.1 5.0 4.3 4.2 4.0  CL  --   < > 99 103 106 108 109  CO2  --   < > 22 21 24 23 21   GLUCOSE  --   < > 379* 410* 88 106* 91  BUN  --   < > 30* 32* 27* 26* 27*  CREATININE  --   < > 2.23* 1.87* 1.59* 1.71* 1.75*  CALCIUM  --   < >  8.3* 8.0* 8.5 8.3* 8.2*  MG 1.9  --   --   --   --   --   --   PHOS 2.5  --   --   --   --   --   --   < > = values in this interval not displayed. Liver Function Tests:  Recent Labs Lab 05/06/14 2040 05/11/14 0422  AST 13 37  ALT 13 21  ALKPHOS 122* 106  BILITOT 0.6 0.4  PROT 6.6 5.7*  ALBUMIN 2.9* 2.2*   No results for input(s): LIPASE, AMYLASE in the last 168 hours. No results for input(s): AMMONIA in the last 168 hours. CBC:  Recent Labs Lab 05/09/14 0542 05/09/14 1600 05/10/14 0515 05/11/14 0422 05/11/14 1722 05/12/14 0333  WBC 8.3 8.2 8.1 9.6  --  11.4*  HGB 7.0* 7.1* 7.1* 6.6* 8.1* 7.4*  HCT 21.8* 21.7* 21.7* 20.5* 24.8* 22.8*  MCV 81.6 82.8 82.2 83.0  --  82.6  PLT 214 228 236 246  --  298   Cardiac Enzymes: No results for  input(s): CKTOTAL, CKMB, CKMBINDEX, TROPONINI in the last 168 hours. BNP (last 3 results) No results for input(s): BNP in the last 8760 hours.  ProBNP (last 3 results) No results for input(s): PROBNP in the last 8760 hours.  CBG:  Recent Labs Lab 05/11/14 2130 05/12/14 0811 05/12/14 0850 05/12/14 1307 05/12/14 1747  GLUCAP 131* 58* 91 116* 147*    Recent Results (from the past 240 hour(s))  Culture, blood (routine x 2)     Status: None (Preliminary result)   Collection Time: 05/06/14  5:04 PM  Result Value Ref Range Status   Specimen Description BLOOD RIGHT ARM  Final   Special Requests BOTTLES DRAWN AEROBIC AND ANAEROBIC  Final   Culture   Final           BLOOD CULTURE RECEIVED NO GROWTH TO DATE CULTURE WILL BE HELD FOR 5 DAYS BEFORE ISSUING A FINAL NEGATIVE REPORT Performed at [facility]    Report Status PENDING  Incomplete  Culture, blood (routine x 2)     Status: None (Preliminary result)   Collection Time: 05/06/14  5:07 PM  Result Value Ref Range Status   Specimen Description BLOOD LEFT ARM  Final   Special Requests BOTTLES DRAWN AEROBIC AND ANAEROBIC 10CC  Final   Culture   Final           BLOOD CULTURE RECEIVED NO GROWTH TO DATE CULTURE WILL BE HELD FOR 5 DAYS BEFORE ISSUING A FINAL NEGATIVE REPORT Performed at [facility]    Report Status PENDING  Incomplete  Anaerobic culture     Status: None (Preliminary result)   Collection Time: 05/08/14  5:59 PM  Result Value Ref Range Status   Specimen Description TISSUE RIGHT FOOT  Final   Special Requests PATIENT ON FOLLOWING VANCOMYCIN PART B  Final   Gram Stain PENDING  Incomplete   Culture   Final    NO ANAEROBES ISOLATED; CULTURE IN PROGRESS FOR 5 DAYS Performed at [facility]    Report Status PENDING  Incomplete  Tissue culture     Status: None   Collection Time: 05/08/14  5:59 PM  Result Value Ref Range Status   Specimen Description TISSUE RIGHT FOOT  Final   Special  Requests PATIENT ON FOLLOWING VANCOMYCIN PART B  Final   Gram Stain   Final    NO WBC SEEN NO SQUAMOUS EPITHELIAL CELLS  SEEN FEW GRAM POSITIVE COCCI IN PAIRS Performed at [facility]    Culture   Final    MULTIPLE ORGANISMS PRESENT, NONE PREDOMINANT Note: NO STAPHYLOCOCCUS AUREUS ISOLATED NO GROUP A STREP (S.PYOGENES) ISOLATED Performed at [facility]    Report Status 05/11/2014 FINAL  Final  Culture, routine-abscess     Status: None   Collection Time: 05/08/14  6:05 PM  Result Value Ref Range Status   Specimen Description ABSCESS RIGHT FOOT  Final   Special Requests PATIENT ON FOLLOWING VANCOMYCIN PART A  Final   Gram Stain   Final    RARE WBC PRESENT, PREDOMINANTLY PMN NO SQUAMOUS EPITHELIAL CELLS SEEN RARE GRAM POSITIVE COCCI IN PAIRS Performed at [facility]    Culture   Final    MULTIPLE ORGANISMS PRESENT, NONE PREDOMINANT Note: NO STAPHYLOCOCCUS AUREUS ISOLATED NO GROUP A STREP (S.PYOGENES) ISOLATED Performed at [facility]    Report Status 05/12/2014 FINAL  Final     Studies: No results found.  Scheduled Meds: . docusate sodium  100 mg Oral BID  . feeding supplement (PRO-STAT SUGAR FREE 64)  30 mL Oral BID  . ferrous sulfate  325 mg Oral Q breakfast  . gabapentin  100 mg Oral TID  . insulin aspart  0-20 Units Subcutaneous TID WC  . insulin aspart  10 Units Subcutaneous TID WC  . insulin detemir  30 Units Subcutaneous QHS  . isosorbide-hydrALAZINE  1 tablet Oral BID  . Linaclotide  145 mcg Oral Daily  . metoprolol tartrate  12.5 mg Oral BID  . pantoprazole  40 mg Oral Q1200   Continuous Infusions: . sodium chloride 10 mL/hr at 05/08/14 1700  . sodium chloride      Principal Problem:   Cellulitis of right lower extremity Active Problems:   HTN (hypertension)   Cellulitis   Uncontrolled diabetes mellitus   Anemia   Diabetic neuropathy, type II diabetes mellitus   Obesity (BMI 30-39.9)   Cellulitis of right  foot   Rachel Gonzalez K  [facility] Pager [phone]. If 7PM-7AM, please contact night-coverage at [email or web address], password [facility] 05/12/2014, 6:24 PM  LOS: 6 days

## 2014-05-12 NOTE — Progress Notes (Signed)
CRITICAL VALUE ALERT  Critical value received:  58  Date of notification: 05/12/2014  Time of notification:  0820  Critical value read back: yes  Nurse who received alert:  Marge K. Meryl CrutchLessard, RN  MD notified (1st page):  Dr. Rhona Leavenshiu  Time of first page:  [phone]  MD notified (2nd page): MD on unit  Time of second page:   Responding MD:  Dr. Rhona Leavenshiu  Time MD responded:  [phone]

## 2014-05-13 LAB — CULTURE, BLOOD (ROUTINE X 2)
CULTURE: NO GROWTH
Culture: NO GROWTH

## 2014-05-13 LAB — SODIUM, URINE, RANDOM: Sodium, Ur: 50 mmol/L

## 2014-05-13 LAB — CBC
HCT: 22.8 % — ABNORMAL LOW (ref 36.0–46.0)
Hemoglobin: 7.3 g/dL — ABNORMAL LOW (ref 12.0–15.0)
MCH: 26.6 pg (ref 26.0–34.0)
MCHC: 32 g/dL (ref 30.0–36.0)
MCV: 83.2 fL (ref 78.0–100.0)
PLATELETS: 335 10*3/uL (ref 150–400)
RBC: 2.74 MIL/uL — ABNORMAL LOW (ref 3.87–5.11)
RDW: 14.3 % (ref 11.5–15.5)
WBC: 11.2 10*3/uL — AB (ref 4.0–10.5)

## 2014-05-13 LAB — URINE MICROSCOPIC-ADD ON

## 2014-05-13 LAB — BASIC METABOLIC PANEL
ANION GAP: 6 (ref 5–15)
BUN: 31 mg/dL — AB (ref 6–23)
CALCIUM: 8.6 mg/dL (ref 8.4–10.5)
CHLORIDE: 109 mmol/L (ref 96–112)
CO2: 22 mmol/L (ref 19–32)
Creatinine, Ser: 1.76 mg/dL — ABNORMAL HIGH (ref 0.50–1.10)
GFR calc Af Amer: 35 mL/min — ABNORMAL LOW (ref >90)
GFR, EST NON AFRICAN AMERICAN: 30 mL/min — AB (ref >90)
GLUCOSE: 157 mg/dL — AB (ref 70–99)
Potassium: 4.4 mmol/L (ref 3.5–5.1)
Sodium: 137 mmol/L (ref 135–145)

## 2014-05-13 LAB — ANAEROBIC CULTURE: Gram Stain: NONE SEEN

## 2014-05-13 LAB — URINALYSIS, ROUTINE W REFLEX MICROSCOPIC
Bilirubin Urine: NEGATIVE
Glucose, UA: NEGATIVE mg/dL
Hgb urine dipstick: NEGATIVE
Ketones, ur: NEGATIVE mg/dL
Nitrite: NEGATIVE
Protein, ur: NEGATIVE mg/dL
SPECIFIC GRAVITY, URINE: 1.013 (ref 1.005–1.030)
Urobilinogen, UA: 0.2 mg/dL (ref 0.0–1.0)
pH: 5.5 (ref 5.0–8.0)

## 2014-05-13 LAB — GLUCOSE, CAPILLARY
GLUCOSE-CAPILLARY: 176 mg/dL — AB (ref 70–99)
GLUCOSE-CAPILLARY: 99 mg/dL (ref 70–99)
Glucose-Capillary: 124 mg/dL — ABNORMAL HIGH (ref 70–99)
Glucose-Capillary: 94 mg/dL (ref 70–99)

## 2014-05-13 MED ORDER — FUROSEMIDE 10 MG/ML IJ SOLN
60.0000 mg | Freq: Once | INTRAMUSCULAR | Status: AC
  Administered 2014-05-13: 60 mg via INTRAVENOUS
  Filled 2014-05-13: qty 6

## 2014-05-13 NOTE — Progress Notes (Signed)
[facility] PROGRESS NOTE  DAILAH OPPERMAN ZOX:[id number] DOB: April 02, 1950 DOA: 05/06/2014 PCP: Pcp Not In System  Assessment/Plan: 1. Diabetic R foot with sepsis 1. Presenting fevers with tachycardia 2. No longer febrile with no leukocytosis 3. Pt is s/p surgery on 3/1 4. Surgery recs for continued IV abx 2. ARF 1. Continue to monitor 2. Cr holding steady at 1.76 today 3. Pt clinically appears volume overloaded with LE edema 4. Will check random urine sodium 5. Thus far, 241cc of urine today. 651 with one unmeasured urine output yesterday with IV lasix 6. For now, cont to hold IVF as pt seems volume overloaded 7. Will give another  IV lasix today x1 8. Follow up urine sodium 3. Uncontrolled DM 1. A1c of over 16 on presentation 2. Seen by Diabetic Coordinator 3. Again counseled patient about importance of tight glycemic control. Pt states she understands 4. Decreased levemir to 28 units from 30 units on 3/5 given glucose of 58 that day 5. Glucose has since remained in the 120's-90's 4. Anemia 1. Hgb of 6.6 on 3/4 2. Off of prophylactic anticoagulation 3. Pt is s/p one unit of PRBC with appropriate correction of hgb 5. Diabetic Neuropathy 1. Per above 2. Goal is tight glycemic control 6. HTN 1. Stable. Tolerating beta blocker 2. For now, continue to avoid ACEI secondary to renal insufficency 3. Would consider transitioning to ACEI when/if renal function improves 7. Obesity 1. Counseled 8. DVT prophylxis 1. SCD's' 9. Constipation 1. Large amount of stool noted on recent imaging 2. Improved with cathartics 3. Continued large BM overnight and this AM following soap suds enema  Code Status: Full Family Communication: Pt in room Disposition Plan: Pending   Consultants:  Orthopedic Surgery  Procedures:  Partial foot amputation 3/1  Antibiotics:  vanc  zosyn   HPI/Subjective: Reports feeling better today. Large BM this AM and overnight    Objective: Filed Vitals:   05/12/14 0636 05/12/14 1517 05/12/14 2124 05/13/14 0518  BP: 133/59 128/98 151/75 124/62  Pulse:  95  67  Temp: 98.9 F (37.2 C) 100.1 F (37.8 C) 98 F (36.7 C) 98.2 F (36.8 C)  TempSrc: Oral Oral Oral Oral  Resp: Height:      Weight:      SpO2: 92% 93% 98% 95%    Intake/Output Summary (Last 24 hours) at 05/13/14 1501 Last data filed at 05/13/14 1000  Gross per 24 hour  Intake    365 ml  Output    892 ml  Net   -527 ml   Filed Weights   05/06/14 1301  Weight: 83.915 kg (185 lb)    Exam:   General:  Awake, sitting in chair, in nad  Cardiovascular: regular, s1, s2  Respiratory: normal resp effort, no wheezing  Abdomen: obese, decreased BS, nondistended  Musculoskeletal: perfused, no clubbing, B LE edema  Data Reviewed: Basic Metabolic Panel:  Recent Labs Lab 05/06/14 2040  05/09/14 0542 05/10/14 0515 05/11/14 0422 05/12/14 0333 05/13/14 0605  NA  --   < > 132* 139 139 137 137  K  --   < > 5.0 4.3 4.2 4.0 4.4  CL  --   < > 103 106 108 109 109  CO2  --   < > GLUCOSE  --   < > 410* 88 106* 91 157*  BUN  --   < > 32* 27* 26* 27* 31*  CREATININE  --   < >  1.87* 1.59* 1.71* 1.75* 1.76*  CALCIUM  --   < > 8.0* 8.5 8.3* 8.2* 8.6  MG 1.9  --   --   --   --   --   --   PHOS 2.5  --   --   --   --   --   --   < > = values in this interval not displayed. Liver Function Tests:  Recent Labs Lab 05/06/14 2040 05/11/14 0422  AST 13 37  ALT 13 21  ALKPHOS 122* 106  BILITOT 0.6 0.4  PROT 6.6 5.7*  ALBUMIN 2.9* 2.2*   No results for input(s): LIPASE, AMYLASE in the last 168 hours. No results for input(s): AMMONIA in the last 168 hours. CBC:  Recent Labs Lab 05/09/14 1600 05/10/14 0515 05/11/14 0422 05/11/14 1722 05/12/14 0333 05/13/14 0605  WBC 8.2 8.1 9.6  --  11.4* 11.2*  HGB 7.1* 7.1* 6.6* 8.1* 7.4* 7.3*  HCT 21.7* 21.7* 20.5* 24.8* 22.8* 22.8*  MCV 82.8 82.2 83.0  --  82.6 83.2   PLT 228 236 246  --  298 335   Cardiac Enzymes: No results for input(s): CKTOTAL, CKMB, CKMBINDEX, TROPONINI in the last 168 hours. BNP (last 3 results) No results for input(s): BNP in the last 8760 hours.  ProBNP (last 3 results) No results for input(s): PROBNP in the last 8760 hours.  CBG:  Recent Labs Lab 05/12/14 1307 05/12/14 1747 05/12/14 2121 05/13/14 0748 05/13/14 1221  GLUCAP 116* 147* 261* 124* 94    Recent Results (from the past 240 hour(s))  Culture, blood (routine x 2)     Status: None   Collection Time: 05/06/14  5:04 PM  Result Value Ref Range Status   Specimen Description BLOOD RIGHT ARM  Final   Special Requests BOTTLES DRAWN AEROBIC AND ANAEROBIC  Final   Culture   Final    NO GROWTH 5 DAYS Performed at [facility]    Report Status 05/13/2014 FINAL  Final  Culture, blood (routine x 2)     Status: None   Collection Time: 05/06/14  5:07 PM  Result Value Ref Range Status   Specimen Description BLOOD LEFT ARM  Final   Special Requests BOTTLES DRAWN AEROBIC AND ANAEROBIC 10CC  Final   Culture   Final    NO GROWTH 5 DAYS Performed at [facility]    Report Status 05/13/2014 FINAL  Final  Anaerobic culture     Status: None   Collection Time: 05/08/14  5:59 PM  Result Value Ref Range Status   Specimen Description TISSUE RIGHT FOOT  Final   Special Requests PATIENT ON FOLLOWING VANCOMYCIN PART B  Final   Gram Stain   Final    NO WBC SEEN NO SQUAMOUS EPITHELIAL CELLS SEEN FEW GRAM POSITIVE COCCI IN PAIRS Performed at [facility]    Culture   Final    NO ANAEROBES ISOLATED Performed at [facility]    Report Status 05/13/2014 FINAL  Final  Tissue culture     Status: None   Collection Time: 05/08/14  5:59 PM  Result Value Ref Range Status   Specimen Description TISSUE RIGHT FOOT  Final   Special Requests PATIENT ON FOLLOWING VANCOMYCIN PART B  Final   Gram Stain   Final    NO WBC SEEN NO SQUAMOUS  EPITHELIAL CELLS SEEN FEW GRAM POSITIVE COCCI IN PAIRS Performed at [facility]    Culture   Final  MULTIPLE ORGANISMS PRESENT, NONE PREDOMINANT Note: NO STAPHYLOCOCCUS AUREUS ISOLATED NO GROUP A STREP (S.PYOGENES) ISOLATED [facility]    Report Status 05/11/2014 FINAL  Final  Culture, routine-abscess     Status: None   Collection Time: 05/08/14  6:05 PM  Result Value Ref Range Status   Specimen Description ABSCESS RIGHT FOOT  Final   Special Requests PATIENT ON FOLLOWING VANCOMYCIN PART A  Final   Gram Stain   Final    RARE WBC PRESENT, PREDOMINANTLY PMN NO SQUAMOUS EPITHELIAL CELLS SEEN RARE GRAM POSITIVE COCCI IN PAIRS [facility]    Culture   Final    MULTIPLE ORGANISMS PRESENT, NONE PREDOMINANT Note: NO STAPHYLOCOCCUS AUREUS ISOLATED NO GROUP A STREP (S.PYOGENES) ISOLATED [facility]    Report Status 05/12/2014 FINAL  Final     Studies: No results found.  Scheduled Meds: . docusate sodium  100 mg Oral BID  . feeding supplement (PRO-STAT SUGAR FREE 64)  30 mL Oral BID  . ferrous sulfate  325 mg Oral Q breakfast  . gabapentin  100 mg Oral TID  . insulin aspart  0-20 Units Subcutaneous TID WC  . insulin aspart  10 Units Subcutaneous TID WC  . insulin detemir  28 Units Subcutaneous QHS  . isosorbide-hydrALAZINE  1 tablet Oral BID  . Linaclotide  145 mcg Oral Daily  . metoprolol tartrate  12.5 mg Oral BID  . pantoprazole  40 mg Oral Q1200   Continuous Infusions: . sodium chloride 10 mL/hr at 05/08/14 1700  . sodium chloride      Principal Problem:   Cellulitis of right lower extremity Active Problems:   HTN (hypertension)   Cellulitis   Uncontrolled diabetes mellitus   Anemia   Diabetic neuropathy, type II diabetes mellitus   Obesity (BMI 30-39.9)   Cellulitis of right foot   Josefine Fuhr K  [facility] Pager [phone]. If 7PM-7AM, please contact night-coverage at  [email or web address], password [email or web address] 05/13/2014, 3:01 PM  LOS: 7 days

## 2014-05-14 DIAGNOSIS — I509 Heart failure, unspecified: Secondary | ICD-10-CM

## 2014-05-14 LAB — BASIC METABOLIC PANEL
Anion gap: 4 — ABNORMAL LOW (ref 5–15)
BUN: 29 mg/dL — AB (ref 6–23)
CO2: 23 mmol/L (ref 19–32)
Calcium: 8.3 mg/dL — ABNORMAL LOW (ref 8.4–10.5)
Chloride: 110 mmol/L (ref 96–112)
Creatinine, Ser: 1.56 mg/dL — ABNORMAL HIGH (ref 0.50–1.10)
GFR, EST AFRICAN AMERICAN: 40 mL/min — AB (ref >90)
GFR, EST NON AFRICAN AMERICAN: 35 mL/min — AB (ref >90)
Glucose, Bld: 73 mg/dL (ref 70–99)
POTASSIUM: 4.3 mmol/L (ref 3.5–5.1)
SODIUM: 137 mmol/L (ref 135–145)

## 2014-05-14 LAB — CBC
HCT: 23.7 % — ABNORMAL LOW (ref 36.0–46.0)
Hemoglobin: 7.7 g/dL — ABNORMAL LOW (ref 12.0–15.0)
MCH: 26.8 pg (ref 26.0–34.0)
MCHC: 32.5 g/dL (ref 30.0–36.0)
MCV: 82.6 fL (ref 78.0–100.0)
Platelets: 378 10*3/uL (ref 150–400)
RBC: 2.87 MIL/uL — ABNORMAL LOW (ref 3.87–5.11)
RDW: 14.6 % (ref 11.5–15.5)
WBC: 9 10*3/uL (ref 4.0–10.5)

## 2014-05-14 LAB — GLUCOSE, CAPILLARY
GLUCOSE-CAPILLARY: 69 mg/dL — AB (ref 70–99)
GLUCOSE-CAPILLARY: 105 mg/dL — AB (ref 70–99)
GLUCOSE-CAPILLARY: 149 mg/dL — AB (ref 70–99)
Glucose-Capillary: 81 mg/dL (ref 70–99)
Glucose-Capillary: 158 mg/dL — ABNORMAL HIGH (ref 70–99)

## 2014-05-14 MED ORDER — INSULIN DETEMIR 100 UNIT/ML ~~LOC~~ SOLN
25.0000 [IU] | Freq: Every day | SUBCUTANEOUS | Status: DC
  Administered 2014-05-14: 25 [IU] via SUBCUTANEOUS
  Filled 2014-05-14 (×2): qty 0.25

## 2014-05-14 MED ORDER — FUROSEMIDE 10 MG/ML IJ SOLN
40.0000 mg | Freq: Once | INTRAMUSCULAR | Status: AC
  Administered 2014-05-14: 40 mg via INTRAVENOUS
  Filled 2014-05-14: qty 4

## 2014-05-14 NOTE — Clinical Social Work Note (Signed)
Bed offers given to patient's husband this morning. Patient's husband will let CSW know decision on facility by 3PM today. Wife would like her husband to make the decision on which SNF she will go to.   Roddie McBryant Avion Kutzer MSW, NellieLCSWA, [place]moLCASA, [phone]

## 2014-05-14 NOTE — Progress Notes (Signed)
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    PT Comments    Pt starting to progress now.  Have encouraged her to be doing some basic LE exercise to get more use to moving and begin the strengthening process.  Follow Up Recommendations  SNF     Equipment Recommendations  Rolling walker with 5" wheels;3in1 (PT)    Recommendations for Other Services       Precautions / Restrictions Precautions Precautions: Fall Restrictions Weight Bearing Restrictions: Yes RLE Weight Bearing: Touchdown weight bearing    Mobility  Bed Mobility               General bed mobility comments: already in the recliner  Transfers Overall transfer level: Needs assistance Equipment used: Rolling walker (2 wheeled);None Transfers: Sit to/from Stand Sit to Stand: Min assist         General transfer comment: cues/demonstration of correct transfer technique and maintainig TDWB.  Both lifting and coming forward assist  Ambulation/Gait Ambulation/Gait assistance: Min assist Ambulation Distance (Feet): 14 Feet (x2) Assistive device: Rolling walker (2 wheeled) Gait Pattern/deviations:  ("swing to /step to " gait pattern) Gait velocity: Decreased   General Gait Details: Emphasized transfer technique, proper gait technique through cuing and demo's.  pt had trouble maintaining TDWB on R during "step/swing to " pattern and put more PWB at 25%   Stairs            Wheelchair Mobility    Modified Rankin (Stroke Patients Only)       Balance Overall balance assessment: Needs assistance Sitting-balance support: No upper  extremity supported Sitting balance-Leahy Scale: Good     Standing balance support: Bilateral upper extremity supported Standing balance-Leahy Scale: Poor Standing balance comment: Working in standing to balance with R foot off the floor                    Cognition Arousal/Alertness: Awake/alert Behavior During Therapy: WFL for tasks assessed/performed Overall Cognitive Status: Within Functional Limits for tasks assessed       Memory: Decreased recall of precautions              Exercises General Exercises - Lower Extremity Ankle Circles/Pumps: AROM;10 reps;Seated Heel Slides: AROM;Strengthening;10 reps;Seated;Both (graded resistance) Hip ABduction/ADduction: AROM;Right;10 reps;Seated Straight Leg Raises: Both;Strengthening;10 reps;AAROM;Seated Other Exercises Other Exercises: arm chair push ups x 10 reps    General Comments        Pertinent Vitals/Pain Pain Assessment: Faces Faces Pain Scale: Hurts little more Pain Location: catch in her R > L hip/lower flank. Pain Descriptors / Indicators: Grimacing Pain Intervention(s): Monitored during session    Home Living                      Prior Function            PT Goals (current goals can now be found in the care plan section) Acute Rehab PT Goals Patient Stated Goal: Get back to work PT Goal Formulation: With patient Time For Goal Achievement: 05/16/14 Potential to Achieve Goals: Good Progress towards PT goals: Progressing toward goals    Frequency  Min 3X/week    PT Plan Discharge plan needs to be updated;Frequency needs to be updated    Co-evaluation             End of Session   Activity Tolerance: Patient limited by fatigue Patient left: in chair;with call bell/phone within reach     Time: 0955-1035 PT Time Calculation (min) (ACUTE ONLY): 40 min  Charges:  $Gait Training: 8-22 mins $Therapeutic Exercise: 8-22 mins $Therapeutic Activity: 8-22 mins                     G Codes:      Adolphus Hanf, Eliseo Gum 05/14/2014, 10:47 AM  05/14/2014  [place], PT [phone]  (pager)

## 2014-05-14 NOTE — Progress Notes (Signed)
NUTRITION FOLLOW UP  Intervention:   -Continue 30 ml Prostat BID, each supplement provides 100 kcals and 15 grams of protein  Nutrition Dx:   Increased nutrient needs related to wound healing as evidenced by estimated energy requirements; ongoing  Goal:   Pt to meet </= 90% of estimated energy requirements; progressing  Monitor:   PO/supplement intake, weight, labs, I/O's  Assessment:   64 y/o female with past medical history of diabetes type 2 on insulin with neuropathy, HTN, obesity and chronic OA. Pt was presented to ED complaining of right foot pain and swelling. She had realized a nail had gone through her shoes and injured her foot under big toe. In ED, pt was found with low grade fever, swelling in her foot with redness, warmness and tenderness extending until ankle area.  S/p Procedure on 05/08/14:  IRRIGATION AND DEBRIDEMENT FOOT, AMPUTATION RAY, right great toe  Pt was very sleepy at time of visit. She is consuming 50-100% of meals. She is also receiving Prostat BID; she is accepting supplement about 50% of the time. RD will continue supplement to maximize protein intake.  Discharge disposition is for SNF once medically stable.  Labs reviewed. 29/1.56, Calcium: 8.3, CBGS: 81-176.   Height: Ht Readings from Last 1 Encounters:  05/06/14 5' 2.5" (1.588 m)    Weight Status:   Wt Readings from Last 1 Encounters:  05/06/14 185 lb (83.915 kg)    Re-estimated needs:  Kcal: 1650-1800 Protein: 85-100 grams Fluid: >/= 1.8L daily  Skin: closed surgical incision on rt foot  Diet Order: Diet Carb Modified   Intake/Output Summary (Last 24 hours) at 05/14/14 0924 Last data filed at 05/14/14 0854  Gross per 24 hour  Intake   1080 ml  Output   2031 ml  Net   -951 ml    Last BM: 05/12/14   Labs:   Recent Labs Lab 05/12/14 0333 05/13/14 0605 05/14/14 0710  NA 137 137 137  K 4.0 4.4 4.3  CL 109 109 110  CO2 21 22 23   BUN 27* 31* 29*  CREATININE 1.75* 1.76* 1.56*   CALCIUM 8.2* 8.6 8.3*  GLUCOSE 91 157* 73    CBG (last 3)   Recent Labs  05/13/14 1722 05/13/14 2109 05/14/14 0754  GLUCAP 176* 99 81   Lab Results  Component Value Date   HGBA1C 16.2* 05/06/2014   Scheduled Meds: . docusate sodium  100 mg Oral BID  . feeding supplement (PRO-STAT SUGAR FREE 64)  30 mL Oral BID  . ferrous sulfate  325 mg Oral Q breakfast  . gabapentin  100 mg Oral TID  . insulin aspart  0-20 Units Subcutaneous TID WC  . insulin aspart  10 Units Subcutaneous TID WC  . insulin detemir  25 Units Subcutaneous QHS  . isosorbide-hydrALAZINE  1 tablet Oral BID  . Linaclotide  145 mcg Oral Daily  . metoprolol tartrate  12.5 mg Oral BID  . pantoprazole  40 mg Oral Q1200    Continuous Infusions: . sodium chloride 10 mL/hr at 05/08/14 1700  . sodium chloride      Verona Hartshorn A. Mayford KnifeWilliams, RD, LDN, CDE Pager: [phone] After hours Pager: [phone]

## 2014-05-14 NOTE — Progress Notes (Signed)
Patient ID: Rachel Gonzalez, female   DOB: 1950-10-26, 64 y.o.   MRN: [id number] Status post right first ray amputation. Will have dressing changed today. I will follow-up in the office.

## 2014-05-14 NOTE — Progress Notes (Signed)
  Echocardiogram 2D Echocardiogram has been performed.  Janalyn HarderWest, Enjoli Tidd R 05/14/2014, 11:44 AM

## 2014-05-15 LAB — HEMOGLOBIN AND HEMATOCRIT, BLOOD
HEMATOCRIT: 25.8 % — AB (ref 36.0–46.0)
HEMOGLOBIN: 8.1 g/dL — AB (ref 12.0–15.0)

## 2014-05-15 LAB — BASIC METABOLIC PANEL
Anion gap: 9 (ref 5–15)
BUN: 35 mg/dL — ABNORMAL HIGH (ref 6–23)
CHLORIDE: 110 mmol/L (ref 96–112)
CO2: 21 mmol/L (ref 19–32)
Calcium: 8.9 mg/dL (ref 8.4–10.5)
Creatinine, Ser: 1.69 mg/dL — ABNORMAL HIGH (ref 0.50–1.10)
GFR calc Af Amer: 36 mL/min — ABNORMAL LOW (ref >90)
GFR calc non Af Amer: 31 mL/min — ABNORMAL LOW (ref >90)
Glucose, Bld: 125 mg/dL — ABNORMAL HIGH (ref 70–99)
Potassium: 4.3 mmol/L (ref 3.5–5.1)
SODIUM: 140 mmol/L (ref 135–145)

## 2014-05-15 LAB — GLUCOSE, CAPILLARY
GLUCOSE-CAPILLARY: 119 mg/dL — AB (ref 70–99)
GLUCOSE-CAPILLARY: 124 mg/dL — AB (ref 70–99)
Glucose-Capillary: 215 mg/dL — ABNORMAL HIGH (ref 70–99)
Glucose-Capillary: 69 mg/dL — ABNORMAL LOW (ref 70–99)
Glucose-Capillary: 76 mg/dL (ref 70–99)

## 2014-05-15 LAB — CREATININE, URINE, RANDOM: Creatinine, Urine: 134.56 mg/dL

## 2014-05-15 LAB — CBC
HCT: 23 % — ABNORMAL LOW (ref 36.0–46.0)
HEMOGLOBIN: 7.3 g/dL — AB (ref 12.0–15.0)
MCH: 26.4 pg (ref 26.0–34.0)
MCHC: 31.7 g/dL (ref 30.0–36.0)
MCV: 83.3 fL (ref 78.0–100.0)
Platelets: 382 10*3/uL (ref 150–400)
RBC: 2.76 MIL/uL — AB (ref 3.87–5.11)
RDW: 14.7 % (ref 11.5–15.5)
WBC: 6.7 10*3/uL (ref 4.0–10.5)

## 2014-05-15 MED ORDER — LACTULOSE 10 GM/15ML PO SOLN
30.0000 g | Freq: Every day | ORAL | Status: DC | PRN
  Administered 2014-05-15: 30 g via ORAL
  Filled 2014-05-15 (×2): qty 45

## 2014-05-15 MED ORDER — SODIUM CHLORIDE 0.9 % IV SOLN
INTRAVENOUS | Status: DC
  Administered 2014-05-15 – 2014-05-16 (×3): via INTRAVENOUS

## 2014-05-15 MED ORDER — INSULIN DETEMIR 100 UNIT/ML ~~LOC~~ SOLN
22.0000 [IU] | Freq: Every day | SUBCUTANEOUS | Status: DC
Start: 2014-05-15 — End: 2014-05-16
  Administered 2014-05-15: 22 [IU] via SUBCUTANEOUS
  Filled 2014-05-15 (×2): qty 0.22

## 2014-05-15 NOTE — Progress Notes (Signed)
Patient ambulating in room, has taken PRN constipation medication and has not had a bowel movement. Prune juice also given. Dr. Rhona Leavenshiu notified.

## 2014-05-15 NOTE — Progress Notes (Signed)
CBG 69, patient eating fruit and having a juice. Will recheck CBG in 15 minutes from treatment. Dr. Rhona Leavenshiu made aware.  5:50 PM Dr. Rhona Leavenshiu placed new orders

## 2014-05-15 NOTE — Patient Care Conference (Addendum)
Attempted to call pt's husband, Molly MaduroRobert, for update at [phone] without success. Tried multiple times. Will follow  UPDATE: Called and updated husband over phone

## 2014-05-15 NOTE — Progress Notes (Signed)
[facility] PROGRESS NOTE  Rachel Gonzalez ZOX:[id number] DOB: 1950/04/28 DOA: 05/06/2014 PCP: Pcp Not In System  Assessment/Plan: 1. Diabetic R foot with sepsis 1. Presenting fevers with tachycardia 2. No longer febrile with no leukocytosis 3. Pt is s/p surgery on 3/1 4. Completed course of IV abx 2. ARF 1. Continue to monitor 2. Cr improved to 1.5 from 1.7 3. Pt clinically appears volume overloaded with LE edema 4. Awaiting random urine sodium 5. Hold IVF as pt seems volume overloaded 6. Will give 40mg  IV lasix today x1 7. Urine Na of 50 3. Uncontrolled DM 1. A1c of over 16 on presentation 2. Seen by Diabetic Coordinator 3. Again counseled patient about importance of tight glycemic control. Pt states she understands 4. Decreased levemir to 28 units from 30 units on 3/5 given glucose of 58 that day 5. Glucose has since remained in the 120's-90's 4. Anemia 1. Hgb of 6.6 on 3/4 2. Off of prophylactic anticoagulation 3. Pt is s/p one unit of PRBC with appropriate correction of hgb 5. Diabetic Neuropathy 1. Per above 2. Goal is tight glycemic control 6. HTN 1. Stable. Tolerating beta blocker 2. For now, continue to avoid ACEI secondary to renal insufficency 3. Would consider transitioning to ACEI when/if renal function improves 7. Obesity 1. Counseled 8. DVT prophylxis 1. SCD's' 9. Constipation 1. Large amount of stool noted on recent imaging 2. Improved with cathartics  Code Status: Full Family Communication: Pt in room Disposition Plan: Pending   Consultants:  Orthopedic Surgery  Procedures:  Partial foot amputation 3/1  Antibiotics:  vanc  zosyn   HPI/Subjective: Feels abd fullness. Overall feels better and eager to go home  Objective: Filed Vitals:   05/15/14 0512 05/15/14 0908 05/15/14 0909 05/15/14 1349  BP: 110/58 117/53  159/82  Pulse: 65  67 75  Temp: 98.2 F (36.8 C)   98.8 F (37.1 C)  TempSrc: Oral   Oral  Resp: 18   16   Height:      Weight:      SpO2: 98%   100%    Intake/Output Summary (Last 24 hours) at 05/15/14 1648 Last data filed at 05/15/14 1100  Gross per 24 hour  Intake    958 ml  Output   1425 ml  Net   -467 ml   Filed Weights   05/06/14 1301  Weight: 83.915 kg (185 lb)    Exam:   General:  Awake, sitting in chair, in nad  Cardiovascular: regular, s1, s2  Respiratory: normal resp effort, no wheezing  Abdomen: obese, decreased BS, nondistended  Musculoskeletal: perfused, no clubbing, B LE edema  Data Reviewed: Basic Metabolic Panel:  Recent Labs Lab 05/11/14 0422 05/12/14 0333 05/13/14 0605 05/14/14 0710 05/15/14 0640  NA 139 137 137 137 140  K 4.2 4.0 4.4 4.3 4.3  CL 108 109 109 110 110  CO2 23 21 22 23 21   GLUCOSE 106* 91 157* 73 125*  BUN 26* 27* 31* 29* 35*  CREATININE 1.71* 1.75* 1.76* 1.56* 1.69*  CALCIUM 8.3* 8.2* 8.6 8.3* 8.9   Liver Function Tests:  Recent Labs Lab 05/11/14 0422  AST 37  ALT 21  ALKPHOS 106  BILITOT 0.4  PROT 5.7*  ALBUMIN 2.2*   No results for input(s): LIPASE, AMYLASE in the last 168 hours. No results for input(s): AMMONIA in the last 168 hours. CBC:  Recent Labs Lab 05/11/14 0422  05/12/14 0333 05/13/14 0605 05/14/14 0710 05/15/14 0640 05/15/14 1535  WBC  9.6  --  11.4* 11.2* 9.0 6.7  --   HGB 6.6*  < > 7.4* 7.3* 7.7* 7.3* 8.1*  HCT 20.5*  < > 22.8* 22.8* 23.7* 23.0* 25.8*  MCV 83.0  --  82.6 83.2 82.6 83.3  --   PLT 246  --  298 335 378 382  --   < > = values in this interval not displayed. Cardiac Enzymes: No results for input(s): CKTOTAL, CKMB, CKMBINDEX, TROPONINI in the last 168 hours. BNP (last 3 results) No results for input(s): BNP in the last 8760 hours.  ProBNP (last 3 results) No results for input(s): PROBNP in the last 8760 hours.  CBG:  Recent Labs Lab 05/14/14 1720 05/14/14 1802 05/14/14 2135 05/15/14 0808 05/15/14 1205  GLUCAP 69* 105* 149* 119* 215*    Recent Results (from the past  240 hour(s))  Culture, blood (routine x 2)     Status: None   Collection Time: 05/06/14  5:04 PM  Result Value Ref Range Status   Specimen Description BLOOD RIGHT ARM  Final   Special Requests BOTTLES DRAWN AEROBIC AND ANAEROBIC  Final   Culture   Final    NO GROWTH 5 DAYS Performed at [facility]    Report Status 05/13/2014 FINAL  Final  Culture, blood (routine x 2)     Status: None   Collection Time: 05/06/14  5:07 PM  Result Value Ref Range Status   Specimen Description BLOOD LEFT ARM  Final   Special Requests BOTTLES DRAWN AEROBIC AND ANAEROBIC 10CC  Final   Culture   Final    NO GROWTH 5 DAYS Performed at [facility]    Report Status 05/13/2014 FINAL  Final  Anaerobic culture     Status: None   Collection Time: 05/08/14  5:59 PM  Result Value Ref Range Status   Specimen Description TISSUE RIGHT FOOT  Final   Special Requests PATIENT ON FOLLOWING VANCOMYCIN PART B  Final   Gram Stain   Final    NO WBC SEEN NO SQUAMOUS EPITHELIAL CELLS SEEN FEW GRAM POSITIVE COCCI IN PAIRS Performed at [facility]    Culture   Final    NO ANAEROBES ISOLATED Performed at [facility]    Report Status 05/13/2014 FINAL  Final  Tissue culture     Status: None   Collection Time: 05/08/14  5:59 PM  Result Value Ref Range Status   Specimen Description TISSUE RIGHT FOOT  Final   Special Requests PATIENT ON FOLLOWING VANCOMYCIN PART B  Final   Gram Stain   Final    NO WBC SEEN NO SQUAMOUS EPITHELIAL CELLS SEEN FEW GRAM POSITIVE COCCI IN PAIRS Performed at [facility]    Culture   Final    MULTIPLE ORGANISMS PRESENT, NONE PREDOMINANT Note: NO STAPHYLOCOCCUS AUREUS ISOLATED NO GROUP A STREP (S.PYOGENES) ISOLATED Performed at [facility]    Report Status 05/11/2014 FINAL  Final  Culture, routine-abscess     Status: None   Collection Time: 05/08/14  6:05 PM  Result Value Ref Range Status   Specimen Description ABSCESS RIGHT  FOOT  Final   Special Requests PATIENT ON FOLLOWING VANCOMYCIN PART A  Final   Gram Stain   Final    RARE WBC PRESENT, PREDOMINANTLY PMN NO SQUAMOUS EPITHELIAL CELLS SEEN RARE GRAM POSITIVE COCCI IN PAIRS Performed at [facility]    Culture   Final    MULTIPLE ORGANISMS PRESENT, NONE PREDOMINANT Note:  NO STAPHYLOCOCCUS AUREUS ISOLATED NO GROUP A STREP (S.PYOGENES) ISOLATED Performed at [facility]    Report Status 05/12/2014 FINAL  Final     Studies: No results found.  Scheduled Meds: . docusate sodium  100 mg Oral BID  . feeding supplement (PRO-STAT SUGAR FREE 64)  30 mL Oral BID  . ferrous sulfate  325 mg Oral Q breakfast  . gabapentin  100 mg Oral TID  . insulin aspart  0-20 Units Subcutaneous TID WC  . insulin aspart  10 Units Subcutaneous TID WC  . insulin detemir  25 Units Subcutaneous QHS  . isosorbide-hydrALAZINE  1 tablet Oral BID  . Linaclotide  145 mcg Oral Daily  . metoprolol tartrate  12.5 mg Oral BID  . pantoprazole  40 mg Oral Q1200   Continuous Infusions: . sodium chloride 10 mL/hr at 05/08/14 1700  . sodium chloride      Principal Problem:   Cellulitis of right lower extremity Active Problems:   HTN (hypertension)   Cellulitis   Uncontrolled diabetes mellitus   Anemia   Diabetic neuropathy, type II diabetes mellitus   Obesity (BMI 30-39.9)   Cellulitis of right foot   CHIU, STEPHEN K  [facility] Pager [phone]. If 7PM-7AM, please contact night-coverage at [email or web address], password [facility] 05/15/2014, 4:48 PM  LOS: 9 days

## 2014-05-15 NOTE — Progress Notes (Addendum)
[facility] PROGRESS NOTE  Josefa HalfCarol A Gamino ZOX:[id number] DOB: May 05, 1950 DOA: 05/06/2014 PCP: Pcp Not In System  Off Service Summary: 64yo with hx of poorly controlled DM presents with diabetic foot with sepsis. Orthopedic surgery was consulted and patient underwent surgery with partial foot amputation. Course has been complicated by ARF that has been slow to resolve, constipation, and bouts of hypoglycemia. Constipation and diabetes are improved, however patient's renal failure persists. Cr initially responded to single dose of lasix. Patient has since been restarted on IVF.  Assessment/Plan: 1. Diabetic R foot with sepsis 1. Presenting fevers with tachycardia 2. No longer febrile with no leukocytosis 3. Pt is s/p surgery on 3/1 4. Completed course of IV abx 2. ARF 1. Continue to monitor 2. Cr has worsened to 1.6 3. Urine Cr has returned today with calculated FENa of 0.45% 4. Initially showed improvement with lasix, but given FENa, will hold further diuretics and start NS at 75cc/hr 3. Uncontrolled DM 1. A1c of over 16 on presentation 2. Seen by Diabetic Coordinator 3. Again counseled patient about importance of tight glycemic control. Pt states she understands 4. Will decrease levemir to 22 units with bouts of borderline low glucose 4. Anemia 1. Hgb of 6.6 on 3/4 2. Off of prophylactic anticoagulation 3. Pt is s/p one unit of PRBC with appropriate correction of hgb 4. Hgb with slight trend down this AM, back up on re-check 5. Follow CBC closely 5. Diabetic Neuropathy 1. Per above 2. Goal is tight glycemic control 6. HTN 1. Stable. Tolerating beta blocker 2. For now, continue to avoid ACEI secondary to renal insufficency 3. Would consider transitioning to ACEI when/if renal function improves 7. Obesity 1. Counseled 8. DVT prophylxis 1. SCD's' 9. Constipation 1. Large amount of stool noted on recent imaging 2. Improved with cathartics  Code Status: Full Family  Communication: Pt in room Disposition Plan: Pending   Consultants:  Orthopedic Surgery  Procedures:  Partial foot amputation 3/1  Antibiotics:  vanc  zosyn   HPI/Subjective: States feeling better today. Looking forward to going to SNF  Objective: Filed Vitals:   05/15/14 0512 05/15/14 0908 05/15/14 0909 05/15/14 1349  BP: 110/58 117/53  159/82  Pulse: 65  67 75  Temp: 98.2 F (36.8 C)   98.8 F (37.1 C)  TempSrc: Oral   Oral  Resp: 18   16  Height:      Weight:      SpO2: 98%   100%    Intake/Output Summary (Last 24 hours) at 05/15/14 1737 Last data filed at 05/15/14 1100  Gross per 24 hour  Intake    958 ml  Output   1425 ml  Net   -467 ml   Filed Weights   05/06/14 1301  Weight: 83.915 kg (185 lb)    Exam:   General:  Awake, sitting in chair, in nad  Cardiovascular: regular, s1, s2  Respiratory: normal resp effort, no wheezing  Abdomen: obese, decreased BS, nondistended  Musculoskeletal: perfused, no clubbing, B LE edema  Data Reviewed: Basic Metabolic Panel:  Recent Labs Lab 05/11/14 0422 05/12/14 0333 05/13/14 0605 05/14/14 0710 05/15/14 0640  NA 139 137 137 137 140  K 4.2 4.0 4.4 4.3 4.3  CL 108 109 109 110 110  CO2 23 21 22 23 21   GLUCOSE 106* 91 157* 73 125*  BUN 26* 27* 31* 29* 35*  CREATININE 1.71* 1.75* 1.76* 1.56* 1.69*  CALCIUM 8.3* 8.2* 8.6 8.3* 8.9   Liver Function Tests:  Recent Labs Lab 05/11/14 0422  AST 37  ALT 21  ALKPHOS 106  BILITOT 0.4  PROT 5.7*  ALBUMIN 2.2*   No results for input(s): LIPASE, AMYLASE in the last 168 hours. No results for input(s): AMMONIA in the last 168 hours. CBC:  Recent Labs Lab 05/11/14 0422  05/12/14 0333 05/13/14 0605 05/14/14 0710 05/15/14 0640 05/15/14 1535  WBC 9.6  --  11.4* 11.2* 9.0 6.7  --   HGB 6.6*  < > 7.4* 7.3* 7.7* 7.3* 8.1*  HCT 20.5*  < > 22.8* 22.8* 23.7* 23.0* 25.8*  MCV 83.0  --  82.6 83.2 82.6 83.3  --   PLT 246  --  298 335 378 382  --   < > =  values in this interval not displayed. Cardiac Enzymes: No results for input(s): CKTOTAL, CKMB, CKMBINDEX, TROPONINI in the last 168 hours. BNP (last 3 results) No results for input(s): BNP in the last 8760 hours.  ProBNP (last 3 results) No results for input(s): PROBNP in the last 8760 hours.  CBG:  Recent Labs Lab 05/14/14 1802 05/14/14 2135 05/15/14 0808 05/15/14 1205 05/15/14 1725  GLUCAP 105* 149* 119* 215* 69*    Recent Results (from the past 240 hour(s))  Culture, blood (routine x 2)     Status: None   Collection Time: 05/06/14  5:04 PM  Result Value Ref Range Status   Specimen Description BLOOD RIGHT ARM  Final   Special Requests BOTTLES DRAWN AEROBIC AND ANAEROBIC  Final   Culture   Final    NO GROWTH 5 DAYS Performed at [facility]    Report Status 05/13/2014 FINAL  Final  Culture, blood (routine x 2)     Status: None   Collection Time: 05/06/14  5:07 PM  Result Value Ref Range Status   Specimen Description BLOOD LEFT ARM  Final   Special Requests BOTTLES DRAWN AEROBIC AND ANAEROBIC 10CC  Final   Culture   Final    NO GROWTH 5 DAYS Performed at [facility]    Report Status 05/13/2014 FINAL  Final  Anaerobic culture     Status: None   Collection Time: 05/08/14  5:59 PM  Result Value Ref Range Status   Specimen Description TISSUE RIGHT FOOT  Final   Special Requests PATIENT ON FOLLOWING VANCOMYCIN PART B  Final   Gram Stain   Final    NO WBC SEEN NO SQUAMOUS EPITHELIAL CELLS SEEN FEW GRAM POSITIVE COCCI IN PAIRS Performed at [facility]    Culture   Final    NO ANAEROBES ISOLATED Performed at [facility]    Report Status 05/13/2014 FINAL  Final  Tissue culture     Status: None   Collection Time: 05/08/14  5:59 PM  Result Value Ref Range Status   Specimen Description TISSUE RIGHT FOOT  Final   Special Requests PATIENT ON FOLLOWING VANCOMYCIN PART B  Final   Gram Stain   Final    NO WBC SEEN NO SQUAMOUS  EPITHELIAL CELLS SEEN FEW GRAM POSITIVE COCCI IN PAIRS Performed at [facility]    Culture   Final    MULTIPLE ORGANISMS PRESENT, NONE PREDOMINANT Note: NO STAPHYLOCOCCUS AUREUS ISOLATED NO GROUP A STREP (S.PYOGENES) ISOLATED Performed at [facility]    Report Status 05/11/2014 FINAL  Final  Culture, routine-abscess     Status: None   Collection Time: 05/08/14  6:05 PM  Result Value Ref Range Status  Specimen Description ABSCESS RIGHT FOOT  Final   Special Requests PATIENT ON FOLLOWING VANCOMYCIN PART A  Final   Gram Stain   Final    RARE WBC PRESENT, PREDOMINANTLY PMN NO SQUAMOUS EPITHELIAL CELLS SEEN RARE GRAM POSITIVE COCCI IN PAIRS Performed at [facility]    Culture   Final    MULTIPLE ORGANISMS PRESENT, NONE PREDOMINANT Note: NO STAPHYLOCOCCUS AUREUS ISOLATED NO GROUP A STREP (S.PYOGENES) ISOLATED Performed at [facility]    Report Status 05/12/2014 FINAL  Final     Studies: No results found.  Scheduled Meds: . docusate sodium  100 mg Oral BID  . feeding supplement (PRO-STAT SUGAR FREE 64)  30 mL Oral BID  . ferrous sulfate  325 mg Oral Q breakfast  . gabapentin  100 mg Oral TID  . insulin aspart  0-20 Units Subcutaneous TID WC  . insulin aspart  10 Units Subcutaneous TID WC  . insulin detemir  25 Units Subcutaneous QHS  . isosorbide-hydrALAZINE  1 tablet Oral BID  . Linaclotide  145 mcg Oral Daily  . metoprolol tartrate  12.5 mg Oral BID  . pantoprazole  40 mg Oral Q1200   Continuous Infusions: . sodium chloride 10 mL/hr at 05/08/14 1700  . sodium chloride    . sodium chloride 75 mL/hr at 05/15/14 1707    Principal Problem:   Cellulitis of right lower extremity Active Problems:   HTN (hypertension)   Cellulitis   Uncontrolled diabetes mellitus   Anemia   Diabetic neuropathy, type II diabetes mellitus   Obesity (BMI 30-39.9)   Cellulitis of right foot   CHIU, STEPHEN K  [facility] Pager [phone]. If  7PM-7AM, please contact night-coverage at [email or web address], password [facility] 05/15/2014, 5:37 PM  LOS: 9 days

## 2014-05-16 LAB — BASIC METABOLIC PANEL
ANION GAP: 5 (ref 5–15)
BUN: 29 mg/dL — AB (ref 6–23)
CALCIUM: 8.6 mg/dL (ref 8.4–10.5)
CHLORIDE: 111 mmol/L (ref 96–112)
CO2: 24 mmol/L (ref 19–32)
Creatinine, Ser: 1.44 mg/dL — ABNORMAL HIGH (ref 0.50–1.10)
GFR calc Af Amer: 44 mL/min — ABNORMAL LOW (ref >90)
GFR calc non Af Amer: 38 mL/min — ABNORMAL LOW (ref >90)
Glucose, Bld: 125 mg/dL — ABNORMAL HIGH (ref 70–99)
Potassium: 4.5 mmol/L (ref 3.5–5.1)
SODIUM: 140 mmol/L (ref 135–145)

## 2014-05-16 LAB — CBC
HCT: 23 % — ABNORMAL LOW (ref 36.0–46.0)
HEMOGLOBIN: 7.4 g/dL — AB (ref 12.0–15.0)
MCH: 27.4 pg (ref 26.0–34.0)
MCHC: 32.2 g/dL (ref 30.0–36.0)
MCV: 85.2 fL (ref 78.0–100.0)
Platelets: 382 10*3/uL (ref 150–400)
RBC: 2.7 MIL/uL — AB (ref 3.87–5.11)
RDW: 15 % (ref 11.5–15.5)
WBC: 7.4 10*3/uL (ref 4.0–10.5)

## 2014-05-16 LAB — GLUCOSE, CAPILLARY
Glucose-Capillary: 129 mg/dL — ABNORMAL HIGH (ref 70–99)
Glucose-Capillary: 87 mg/dL (ref 70–99)

## 2014-05-16 MED ORDER — FERROUS SULFATE 325 (65 FE) MG PO TABS: 325.0000 mg | ORAL_TABLET | Freq: Every day | ORAL | Status: DC

## 2014-05-16 MED ORDER — INSULIN DETEMIR 100 UNIT/ML ~~LOC~~ SOLN: 28.0000 [IU] | Freq: Every day | SUBCUTANEOUS | Status: DC

## 2014-05-16 MED ORDER — OXYCODONE-ACETAMINOPHEN 5-325 MG PO TABS: 1.0000 | ORAL_TABLET | Freq: Three times a day (TID) | ORAL | Status: DC | PRN

## 2014-05-16 MED ORDER — ISOSORB DINITRATE-HYDRALAZINE 20-37.5 MG PO TABS: 1.0000 | ORAL_TABLET | Freq: Two times a day (BID) | ORAL | Status: DC

## 2014-05-16 MED ORDER — PANTOPRAZOLE SODIUM 40 MG PO TBEC: 40.0000 mg | DELAYED_RELEASE_TABLET | Freq: Every day | ORAL | Status: DC

## 2014-05-16 MED ORDER — DOCUSATE SODIUM 100 MG PO CAPS: 100.0000 mg | ORAL_CAPSULE | Freq: Two times a day (BID) | ORAL | Status: DC

## 2014-05-16 MED ORDER — INSULIN ASPART 100 UNIT/ML ~~LOC~~ SOLN: SUBCUTANEOUS | Status: DC

## 2014-05-16 MED ORDER — PRO-STAT SUGAR FREE PO LIQD: 30.0000 mL | Freq: Two times a day (BID) | ORAL | Status: DC

## 2014-05-16 MED ORDER — METOPROLOL TARTRATE 25 MG PO TABS: 12.5000 mg | ORAL_TABLET | Freq: Two times a day (BID) | ORAL | Status: DC

## 2014-05-16 NOTE — Progress Notes (Signed)
Physical Therapy Treatment Patient Details Name: Rachel Gonzalez MRN: [id number] DOB: 1950-05-07 Today's Date: 05/16/2014    History of Present Illness Patient is a 64 year old woman with diabetic insensate neuropathy who states that she stepped on a nail about 3 days ago she's been having increasing pain and increasing redness increasing swelling is swelling and is now s/p R foot 1st ray amputation on 05/08/14.    PT Comments    Progressing steadily.  Still having trouble with maintaining TDWB.  Follow Up Recommendations  SNF     Equipment Recommendations  Rolling walker with 5" wheels;3in1 (PT)    Recommendations for Other Services       Precautions / Restrictions Precautions Precautions: Fall Restrictions Weight Bearing Restrictions: Yes RLE Weight Bearing: Touchdown weight bearing    Mobility  Bed Mobility Overal bed mobility: Needs Assistance             General bed mobility comments: already in the recliner  Transfers Overall transfer level: Needs assistance Equipment used: Rolling walker (2 wheeled);None Transfers: Sit to/from Stand Sit to Stand: Min guard   Squat pivot transfers: Min assist     General transfer comment: cues/demonstration of correct transfer technique and maintainig TDWB.  Both lifting and coming forward assist  Ambulation/Gait Ambulation/Gait assistance: Min guard;Min assist Ambulation Distance (Feet): 16 Feet Assistive device: Rolling walker (2 wheeled) Gait Pattern/deviations: Step-through pattern Gait velocity: Decreased   General Gait Details: Emphasized transfer technique, proper gait technique through cuing and demo's.  pt had trouble maintaining TDWB on R during "step/swing to " pattern and put more PWB at 25%   Stairs            Wheelchair Mobility    Modified Rankin (Stroke Patients Only)       Balance Overall balance assessment: Needs assistance Sitting-balance support: No upper extremity supported Sitting  balance-Leahy Scale: Good Sitting balance - Comments: improving balance in stance   Standing balance support: No upper extremity supported Standing balance-Leahy Scale: Fair                      Cognition Arousal/Alertness: Awake/alert Behavior During Therapy: WFL for tasks assessed/performed Overall Cognitive Status: Within Functional Limits for tasks assessed                      Exercises General Exercises - Lower Extremity Ankle Circles/Pumps: AROM;10 reps;Seated Quad Sets: AROM;Both;10 reps;Seated Gluteal Sets: AROM;Both;10 reps;Seated Heel Slides: AROM;Strengthening;10 reps;Seated;Both (graded resistance) Straight Leg Raises: Both;Strengthening;10 reps;AAROM;Seated    General Comments General comments (skin integrity, edema, etc.): Pt with c/o of increased edema and and unchanged dressing.  Discussed methods to decrease edema.      Pertinent Vitals/Pain Pain Assessment: No/denies pain    Home Living                      Prior Function            PT Goals (current goals can now be found in the care plan section) Acute Rehab PT Goals Patient Stated Goal: Get back to work PT Goal Formulation: With patient Time For Goal Achievement: 05/16/14 Potential to Achieve Goals: Good Progress towards PT goals: Progressing toward goals    Frequency  Min 3X/week    PT Plan Current plan remains appropriate    Co-evaluation             End of Session   Activity Tolerance: Patient limited by fatigue;Patient tolerated  treatment well Patient left: in chair;with call bell/phone within reach     Time: 0955-1020 PT Time Calculation (min) (ACUTE ONLY): 25 min  Charges:  $Gait Training: 8-22 mins $Therapeutic Exercise: 8-22 mins                    G Codes:      Rachel Gonzalez, Eliseo Gum 05/16/2014, 10:45 AM

## 2014-05-16 NOTE — Clinical Social Work Note (Signed)
Clinical Social Worker facilitated patient discharge including contacting patient family and facility to confirm patient discharge plans.  Clinical information faxed to facility and family agreeable with plan.  CS[facility]  RN to call report prior to discharge.  Clinical Social Worker will sign off for now as social work intervention is no longer needed. Please consult us again if new need arises.  Macario GoldsJesse Kimberly Nieland, [place] [phone]

## 2014-05-16 NOTE — Discharge Instructions (Signed)
Follow with Primary MD or SNF MD in 7 days , Keep your R Foot clean and dry at all times  Get CBC, CMP, 2 view Chest X ray checked  by SNF MD next visit.    Activity: As tolerated with Full fall precautions use walker/cane & assistance as needed   Disposition SNF   Diet: Heart Healthy Low Carb.  For Heart failure patients - Check your Weight same time everyday, if you gain over 2 pounds, or you develop in leg swelling, experience more shortness of breath or chest pain, call your Primary MD immediately. Follow Cardiac Low Salt Diet and 1.5 lit/day fluid restriction.   On your next visit with your primary care physician please Get Medicines reviewed and adjusted.   Please request your Prim.MD to go over all Hospital Tests and Procedure/Radiological results at the follow up, please get all Hospital records sent to your Prim MD by signing hospital release before you go home.   If you experience worsening of your admission symptoms, develop shortness of breath, life threatening emergency, suicidal or homicidal thoughts you must seek medical attention immediately by calling 911 or calling your MD immediately  if symptoms less severe.  You Must read complete instructions/literature along with all the possible adverse reactions/side effects for all the Medicines you take and that have been prescribed to you. Take any new Medicines after you have completely understood and accpet all the possible adverse reactions/side effects.   Do not drive, operating heavy machinery, perform activities at heights, swimming or participation in water activities or provide baby sitting services if your were admitted for syncope or siezures until you have seen by Primary MD or a Neurologist and advised to do so again.  Do not drive when taking Pain medications.    Do not take more than prescribed Pain, Sleep and Anxiety Medications  Special Instructions: If you have smoked or chewed Tobacco  in the last 2 yrs  please stop smoking, stop any regular Alcohol  and or any Recreational drug use.  Wear Seat belts while driving.   Please note  You were cared for by a hospitalist during your hospital stay. If you have any questions about your discharge medications or the care you received while you were in the hospital after you are discharged, you can call the unit and asked to speak with the hospitalist on call if the hospitalist that took care of you is not available. Once you are discharged, your primary care physician will handle any further medical issues. Please note that NO REFILLS for any discharge medications will be authorized once you are discharged, as it is imperative that you return to your primary care physician (or establish a relationship with a primary care physician if you do not have one) for your aftercare needs so that they can reassess your need for medications and monitor your lab values.

## 2014-05-16 NOTE — Progress Notes (Signed)
Report called to Marisue IvanLiz, Merchandiser[facility] 12:22 PM

## 2014-05-16 NOTE — Progress Notes (Addendum)
Inpatient Diabetes Program Recommendations  AACE/ADA: New Consensus Statement on Inpatient Glycemic Control (2013)  Target Ranges:  Prepandial:   less than 140 mg/dL      Peak postprandial:   less than 180 mg/dL (1-2 hours)      Critically ill patients:  140 - 180 mg/dL   Noted pt for discharge to SNF-for discharge orders, please review the following recommendations: Lantus dose at 22 units appears to be effective at controlling fasting glucose without hypoglycemia and without hyperglycemia Hypoglycemia is occurring following meals after correction and meal coverage  Inpatient Diabetes Program Recommendations Correction (SSI): Please decrease correction to sensitive tidwc from resistant Insulin - Meal Coverage: Please consider a decrease in meal coverge to 5 units tidwc. Hypoglycemia occurring following meals  Thank you Lenor CoffinAnn Brittani Purdum, RN, MSN, CDE  Diabetes Inpatient Program Office: [phone] Pager: [phone] 8:00 am to 5:00 pm

## 2014-05-16 NOTE — Progress Notes (Signed)
Rachel Gonzalez A Hammac to be D/C'd Skilled nursing facility per MD order.  Discussed with the patient and all questions fully answered.    Medication List    STOP taking these medications        diphenhydrAMINE 25 MG tablet  Commonly known as:  BENADRYL     insulin NPH-regular Human (70-30) 100 UNIT/ML injection  Commonly known as:  NOVOLIN 70/30     naproxen sodium 220 MG tablet  Commonly known as:  ANAPROX      TAKE these medications        docusate sodium 100 MG capsule  Commonly known as:  COLACE  Take 1 capsule (100 mg total) by mouth 2 (two) times daily.     feeding supplement (PRO-STAT SUGAR FREE 64) Liqd  Take 30 mLs by mouth 2 (two) times daily.     ferrous sulfate 325 (65 FE) MG tablet  Take 1 tablet (325 mg total) by mouth daily with breakfast.     insulin aspart 100 UNIT/ML injection  Commonly known as:  novoLOG  - Before each meal 3 times a day, 140-199 - 2 units, 200-250 - 4 units, 251-299 - 6 units,  300-349 - 8 units,  350 or above 10 units.  - Insulin PEN if approved, provide syringes and needles if needed.     insulin detemir 100 UNIT/ML injection  Commonly known as:  LEVEMIR  Inject 0.28 mLs (28 Units total) into the skin at bedtime.     isosorbide-hydrALAZINE 20-37.5 MG per tablet  Commonly known as:  BIDIL  Take 1 tablet by mouth 2 (two) times daily.     metoprolol tartrate 25 MG tablet  Commonly known as:  LOPRESSOR  Take 0.5 tablets (12.5 mg total) by mouth 2 (two) times daily.     oxyCODONE-acetaminophen 5-325 MG per tablet  Commonly known as:  PERCOCET/ROXICET  Take 1 tablet by mouth every 8 (eight) hours as needed for severe pain.     pantoprazole 40 MG tablet  Commonly known as:  PROTONIX  Take 1 tablet (40 mg total) by mouth daily at 12 noon.         VVS. Right foot dressing changed, clean dry and intact.  IV catheter discontinued intact. Site without signs and symptoms of complications. Dressing and pressure applied.  An After Visit  Summary was printed and given to the patient.  D/c education completed with patient/family including follow up instructions, medication list, d/c activities limitations if indicated, with other d/c instructions as indicated by MD - patient able to verbalize understanding, all questions fully answered.   Patient instructed to return to ED, call 911, or call MD for any changes in condition.   [facility] via WC, [facility]   Aldean AstLEsperance, Truth Barot C 05/16/2014 12:18 PM
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
## 2014-05-17 ENCOUNTER — Non-Acute Institutional Stay (SKILLED_NURSING_FACILITY): Payer: 59 | Admitting: Adult Health

## 2014-05-17 DIAGNOSIS — K5900 Constipation, unspecified: Secondary | ICD-10-CM

## 2014-05-17 DIAGNOSIS — IMO0002 Reserved for concepts with insufficient information to code with codable children: Secondary | ICD-10-CM

## 2014-05-17 DIAGNOSIS — E1165 Type 2 diabetes mellitus with hyperglycemia: Secondary | ICD-10-CM | POA: Diagnosis not present

## 2014-05-17 DIAGNOSIS — D6489 Other specified anemias: Secondary | ICD-10-CM

## 2014-05-17 DIAGNOSIS — I10 Essential (primary) hypertension: Secondary | ICD-10-CM | POA: Diagnosis not present

## 2014-05-17 DIAGNOSIS — L03115 Cellulitis of right lower limb: Secondary | ICD-10-CM | POA: Diagnosis not present

## 2014-05-17 DIAGNOSIS — N179 Acute kidney failure, unspecified: Secondary | ICD-10-CM | POA: Diagnosis not present

## 2014-05-17 DIAGNOSIS — K219 Gastro-esophageal reflux disease without esophagitis: Secondary | ICD-10-CM | POA: Diagnosis not present

## 2014-05-18 ENCOUNTER — Non-Acute Institutional Stay (SKILLED_NURSING_FACILITY): Payer: 59 | Admitting: Internal Medicine

## 2014-05-18 DIAGNOSIS — N179 Acute kidney failure, unspecified: Secondary | ICD-10-CM | POA: Diagnosis not present

## 2014-05-18 DIAGNOSIS — K219 Gastro-esophageal reflux disease without esophagitis: Secondary | ICD-10-CM | POA: Diagnosis not present

## 2014-05-18 DIAGNOSIS — E1165 Type 2 diabetes mellitus with hyperglycemia: Secondary | ICD-10-CM

## 2014-05-18 DIAGNOSIS — K5901 Slow transit constipation: Secondary | ICD-10-CM | POA: Diagnosis not present

## 2014-05-18 DIAGNOSIS — L03115 Cellulitis of right lower limb: Secondary | ICD-10-CM

## 2014-05-18 DIAGNOSIS — I10 Essential (primary) hypertension: Secondary | ICD-10-CM | POA: Diagnosis not present

## 2014-05-18 DIAGNOSIS — D6489 Other specified anemias: Secondary | ICD-10-CM

## 2014-05-18 DIAGNOSIS — IMO0002 Reserved for concepts with insufficient information to code with codable children: Secondary | ICD-10-CM

## 2014-05-19 NOTE — Progress Notes (Signed)
Patient ID: Rachel Gonzalez, female   DOB: 1950/09/25, 64 y.o.   MRN: [id number]     [facility]   PCP: Pcp Not In System  Code Status: full code  Allergies  Allergen Reactions  . Tramadol Nausea And Vomiting    Chief Complaint  Patient presents with  . New Admit To SNF     HPI:  64 year old patient is here for short term rehabilitation post hospital admission from 05/06/14-05/16/14 with right foot infection and sepsis. She was treated for sepsis, acute renal failure and underwent right partial foot amputation.  She has history of uncontrolled DM, HTN, OA, obesity. She is seen in her room today. Her foot pain is under control with current regimen. Her medications were adjusted yesterday. No further hypoglycemic episodes in the facility. Has been working with therapy team. She has been constipated. cbg 161 this am.   Review of Systems:  Constitutional: Negative for fever, chills, diaphoresis.  HENT: Negative for headache, congestion  Respiratory: Negative for cough, shortness of breath and wheezing.   Cardiovascular: Negative for chest pain, palpitations, leg swelling.  Gastrointestinal: Negative for heartburn, nausea, vomiting, abdominal pain Genitourinary: Negative for dysuria Musculoskeletal: Negative for back pain, falls Skin: Negative for itching, rash.  Neurological: Negative for weakness Psychiatric/Behavioral: Negative for depression   Past Medical History  Diagnosis Date  . Diabetes mellitus    Past Surgical History  Procedure Laterality Date  . Arm surgery     . Cesarean section    . I&d extremity Right 05/08/2014    Procedure: IRRIGATION AND DEBRIDEMENT FOOT;  Surgeon: Nadara Mustard, MD;  Location: [facility] OR;  Service: Orthopedics;  Laterality: Right;  . Amputation Right 05/08/2014    Procedure: AMPUTATION RAY, right great toe;  Surgeon: Nadara Mustard, MD;  Location: [facility] OR;  Service: Orthopedics;  Laterality: Right;   Social  History:   reports that she has never smoked. She does not have any smokeless tobacco history on file. She reports that she does not drink alcohol or use illicit drugs.  Family History  Problem Relation Age of Onset  . Diabetes Other     Medications: Patient's Medications  New Prescriptions   No medications on file  Previous Medications   AMINO ACIDS-PROTEIN HYDROLYS (FEEDING SUPPLEMENT, PRO-STAT SUGAR FREE 64,) LIQD    Take 30 mLs by mouth 2 (two) times daily.   DOCUSATE SODIUM (COLACE) 100 MG CAPSULE    Take 1 capsule (100 mg total) by mouth 2 (two) times daily.   FERROUS SULFATE 325 (65 FE) MG TABLET    Take 1 tablet (325 mg total) by mouth daily with breakfast.   INSULIN ASPART (NOVOLOG) 100 UNIT/ML INJECTION    Before each meal 3 times a day, 140-199 - 2 units, 200-250 - 4 units, 251-299 - 6 units,  300-349 - 8 units,  350 or above 10 units. Insulin PEN if approved, provide syringes and needles if needed.   INSULIN DETEMIR (LEVEMIR) 100 UNIT/ML INJECTION    Inject 0.28 mLs (28 Units total) into the skin at bedtime.   ISOSORBIDE-HYDRALAZINE (BIDIL) 20-37.5 MG PER TABLET    Take 1 tablet by mouth 2 (two) times daily.   METOPROLOL TARTRATE (LOPRESSOR) 25 MG TABLET    Take 0.5 tablets (12.5 mg total) by mouth 2 (two) times daily.   OXYCODONE-ACETAMINOPHEN (PERCOCET/ROXICET) 5-325 MG PER TABLET    Take 1 tablet by mouth every 8 (eight) hours as needed for severe  pain.   PANTOPRAZOLE (PROTONIX) 40 MG TABLET    Take 1 tablet (40 mg total) by mouth daily at 12 noon.  Modified Medications   No medications on file  Discontinued Medications   No medications on file     Physical Exam: Filed Vitals:   05/18/14 2059  BP: 142/73  Pulse: 65  Temp: 97.8 F (36.6 C)  Resp: 18  SpO2: 98%    General- elderly female, obese, in no acute distress Head- normocephalic, atraumatic Throat- moist mucus membrane Neck- no cervical lymphadenopathy Cardiovascular- normal s1,s2, no murmurs, left  dorsalis pedis palpable and good radial pulses, leg edema Respiratory- bilateral clear to auscultation, no wheeze, no rhonchi, no crackles, no use of accessory muscles Abdomen- bowel sounds present, soft, non tender Musculoskeletal- able to move all 4 extremities, right foot in dressing with dressing clean and dry, using a walker  Neurological- no focal deficit Skin- warm and dry Psychiatry- alert and oriented to person, place and time, normal mood and affect    Labs reviewed: Basic Metabolic Panel:  Recent Labs  16/10/96 2040  05/14/14 0710 05/15/14 0640 05/16/14 0705  NA  --   < > 137 140 140  K  --   < > 4.3 4.3 4.5  CL  --   < > 110 110 111  CO2  --   < > GLUCOSE  --   < > 73 125* 125*  BUN  --   < > 29* 35* 29*  CREATININE  --   < > 1.56* 1.69* 1.44*  CALCIUM  --   < > 8.3* 8.9 8.6  MG 1.9  --   --   --   --   PHOS 2.5  --   --   --   --   < > = values in this interval not displayed. Liver Function Tests:  Recent Labs  03/20/14 0815 05/06/14 2040 05/11/14 0422  AST 17 13 37  ALT ALKPHOS 156* 122* 106  BILITOT 0.5 0.6 0.4  PROT 6.6 6.6 5.7*  ALBUMIN 3.3* 2.9* 2.2*   No results for input(s): LIPASE, AMYLASE in the last 8760 hours. No results for input(s): AMMONIA in the last 8760 hours. CBC:  Recent Labs  03/11/14 0905 03/20/14 0815  05/14/14 0710 05/15/14 0640 05/15/14 1535 05/16/14 0705  WBC 4.3 4.7  < > 9.0 6.7  --  7.4  NEUTROABS 2.3 2.7  --   --   --   --   --   HGB 10.9* 11.1*  < > 7.7* 7.3* 8.1* 7.4*  HCT 34.3* 34.1*  < > 23.7* 23.0* 25.8* 23.0*  MCV 80.5 82.0  < > 82.6 83.3  --  85.2  PLT 194 221  < > 378 382  --  382  < > = values in this interval not displayed. Cardiac Enzymes: No results for input(s): CKTOTAL, CKMB, CKMBINDEX, TROPONINI in the last 8760 hours. BNP: Invalid input(s): POCBNP CBG:  Recent Labs  05/15/14 2215 05/16/14 0749 05/16/14 1237  GLUCAP 76 129* 87     Assessment/Plan  Right foot  cellulitis S/p partial amputation. Has f/u with dr duda. Is here for rehabilitation. To work with PT and OT for gait stability training. Continue percocet 5-325 mg q8h and prn regimen. Touch down weight bearing for now  DM type 2 uncontrolled Continue levemir 28 u with SSI novolog. Monitor cbg  Anemia  Likely post op from blood loss  along with her anemia of chronic disease. Monitor h&h. Continue ferrous sulfate  HTN bp stable, continue bidil 20-37.5 bid with lopressor 12.5 bid  Constipation D/c colace, add miralax daily and senokot s bid for now, encouraged hydration, monitor clinically  Acute renal failure Monitor renal function, avoid NSAIDs  gerd Continue protonix 40 mg daily  Goals of care: short term rehabilitation   Labs/tests ordered: cbc,bmp  Family/ staff Communication: reviewed care plan with patient and nursing supervisor    Oneal GroutMAHIMA Jailin Moomaw, MD  [facility] [phone] (Monday-Friday 8 am - 5 pm) [phone] (afterhours)

## 2014-05-28 ENCOUNTER — Non-Acute Institutional Stay (SKILLED_NURSING_FACILITY): Payer: 59 | Admitting: Adult Health

## 2014-05-28 DIAGNOSIS — I10 Essential (primary) hypertension: Secondary | ICD-10-CM | POA: Diagnosis not present

## 2014-05-28 DIAGNOSIS — L03115 Cellulitis of right lower limb: Secondary | ICD-10-CM

## 2014-05-28 DIAGNOSIS — K219 Gastro-esophageal reflux disease without esophagitis: Secondary | ICD-10-CM

## 2014-05-28 DIAGNOSIS — IMO0002 Reserved for concepts with insufficient information to code with codable children: Secondary | ICD-10-CM

## 2014-05-28 DIAGNOSIS — N179 Acute kidney failure, unspecified: Secondary | ICD-10-CM

## 2014-05-28 DIAGNOSIS — K5901 Slow transit constipation: Secondary | ICD-10-CM

## 2014-05-28 DIAGNOSIS — D6489 Other specified anemias: Secondary | ICD-10-CM

## 2014-05-28 DIAGNOSIS — E1165 Type 2 diabetes mellitus with hyperglycemia: Secondary | ICD-10-CM | POA: Diagnosis not present

## 2014-05-30 ENCOUNTER — Encounter: Payer: Self-pay | Admitting: Adult Health
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
## 2014-06-03 ENCOUNTER — Emergency Department (HOSPITAL_COMMUNITY)
Admission: EM | Admit: 2014-06-03 | Discharge: 2014-06-04 | Disposition: A | Discharge status: Home / Self Care | Payer: 59 | Attending: Emergency Medicine | Admitting: Emergency Medicine

## 2014-06-03 ENCOUNTER — Encounter (HOSPITAL_COMMUNITY): Payer: Self-pay | Admitting: Nurse Practitioner

## 2014-06-03 DIAGNOSIS — Z792 Long term (current) use of antibiotics: Secondary | ICD-10-CM | POA: Insufficient documentation

## 2014-06-03 DIAGNOSIS — T364X5A Adverse effect of tetracyclines, initial encounter: Secondary | ICD-10-CM | POA: Insufficient documentation

## 2014-06-03 DIAGNOSIS — D649 Anemia, unspecified: Secondary | ICD-10-CM | POA: Diagnosis not present

## 2014-06-03 DIAGNOSIS — Z79899 Other long term (current) drug therapy: Secondary | ICD-10-CM | POA: Insufficient documentation

## 2014-06-03 DIAGNOSIS — E1165 Type 2 diabetes mellitus with hyperglycemia: Secondary | ICD-10-CM

## 2014-06-03 DIAGNOSIS — R112 Nausea with vomiting, unspecified: Secondary | ICD-10-CM | POA: Diagnosis not present

## 2014-06-03 DIAGNOSIS — Z789 Other specified health status: Secondary | ICD-10-CM

## 2014-06-03 DIAGNOSIS — E860 Dehydration: Secondary | ICD-10-CM | POA: Diagnosis not present

## 2014-06-03 DIAGNOSIS — I10 Essential (primary) hypertension: Secondary | ICD-10-CM

## 2014-06-03 DIAGNOSIS — R1084 Generalized abdominal pain: Secondary | ICD-10-CM | POA: Diagnosis not present

## 2014-06-03 LAB — CBC WITH DIFFERENTIAL/PLATELET
Basophils Absolute: 0 10*3/uL (ref 0.0–0.1)
Basophils Relative: 1 % (ref 0–1)
Eosinophils Absolute: 0 10*3/uL (ref 0.0–0.7)
Eosinophils Relative: 0 % (ref 0–5)
HEMATOCRIT: 32.3 % — AB (ref 36.0–46.0)
Hemoglobin: 10.1 g/dL — ABNORMAL LOW (ref 12.0–15.0)
Lymphocytes Relative: 21 % (ref 12–46)
Lymphs Abs: 1.1 10*3/uL (ref 0.7–4.0)
MCH: 26.4 pg (ref 26.0–34.0)
MCHC: 31.3 g/dL (ref 30.0–36.0)
MCV: 84.3 fL (ref 78.0–100.0)
MONOS PCT: 7 % (ref 3–12)
Monocytes Absolute: 0.4 10*3/uL (ref 0.1–1.0)
NEUTROS ABS: 3.8 10*3/uL (ref 1.7–7.7)
NEUTROS PCT: 71 % (ref 43–77)
PLATELETS: 279 10*3/uL (ref 150–400)
RBC: 3.83 MIL/uL — ABNORMAL LOW (ref 3.87–5.11)
RDW: 14.5 % (ref 11.5–15.5)
WBC: 5.4 10*3/uL (ref 4.0–10.5)

## 2014-06-03 LAB — COMPREHENSIVE METABOLIC PANEL
ALT: 18 U/L (ref 0–35)
ANION GAP: 12 (ref 5–15)
AST: 15 U/L (ref 0–37)
Albumin: 3 g/dL — ABNORMAL LOW (ref 3.5–5.2)
Alkaline Phosphatase: 158 U/L — ABNORMAL HIGH (ref 39–117)
BILIRUBIN TOTAL: 0.8 mg/dL (ref 0.3–1.2)
BUN: 9 mg/dL (ref 6–23)
CALCIUM: 9.3 mg/dL (ref 8.4–10.5)
CO2: 25 mmol/L (ref 19–32)
CREATININE: 1.26 mg/dL — AB (ref 0.50–1.10)
Chloride: 100 mmol/L (ref 96–112)
GFR calc Af Amer: 52 mL/min — ABNORMAL LOW (ref >90)
GFR calc non Af Amer: 45 mL/min — ABNORMAL LOW (ref >90)
GLUCOSE: 336 mg/dL — AB (ref 70–99)
Potassium: 4 mmol/L (ref 3.5–5.1)
Sodium: 137 mmol/L (ref 135–145)
TOTAL PROTEIN: 7.4 g/dL (ref 6.0–8.3)

## 2014-06-03 LAB — URINALYSIS, ROUTINE W REFLEX MICROSCOPIC
Glucose, UA: >1000 mg/dL — AB
Ketones, ur: >80 mg/dL — AB
Leukocytes, UA: NEGATIVE
Nitrite: NEGATIVE
PH: 6 (ref 5.0–8.0)
Protein, ur: >300 mg/dL — AB
Specific Gravity, Urine: 1.025 (ref 1.005–1.030)
Urobilinogen, UA: 0.2 mg/dL (ref 0.0–1.0)

## 2014-06-03 LAB — URINE MICROSCOPIC-ADD ON

## 2014-06-03 LAB — CBG MONITORING, ED: GLUCOSE-CAPILLARY: 304 mg/dL — AB (ref 70–99)

## 2014-06-03 LAB — I-STAT TROPONIN, ED: TROPONIN I, POC: 0.01 ng/mL (ref 0.00–0.08)

## 2014-06-03 LAB — LIPASE, BLOOD: Lipase: 18 U/L (ref 11–59)

## 2014-06-03 MED ORDER — INSULIN ASPART 100 UNIT/ML IV SOLN
5.0000 [IU] | Freq: Once | INTRAVENOUS | Status: AC
  Administered 2014-06-03: 5 [IU] via INTRAVENOUS
  Filled 2014-06-03: qty 1

## 2014-06-03 MED ORDER — CEPHALEXIN 500 MG PO CAPS: ORAL_CAPSULE | ORAL | Status: DC

## 2014-06-03 MED ORDER — ONDANSETRON HCL 4 MG/2ML IJ SOLN
4.0000 mg | Freq: Once | INTRAMUSCULAR | Status: AC
  Administered 2014-06-03: 4 mg via INTRAVENOUS
  Filled 2014-06-03: qty 2

## 2014-06-03 MED ORDER — SULFAMETHOXAZOLE-TRIMETHOPRIM 800-160 MG PO TABS: 1.0000 | ORAL_TABLET | Freq: Two times a day (BID) | ORAL | Status: DC

## 2014-06-03 MED ORDER — SODIUM CHLORIDE 0.9 % IV BOLUS (SEPSIS)
1000.0000 mL | Freq: Once | INTRAVENOUS | Status: AC
Start: 2014-06-03 — End: 2014-06-03
  Administered 2014-06-03: 1000 mL via INTRAVENOUS

## 2014-06-03 MED ORDER — MORPHINE SULFATE 4 MG/ML IJ SOLN
4.0000 mg | Freq: Once | INTRAMUSCULAR | Status: AC
  Administered 2014-06-03: 4 mg via INTRAVENOUS
  Filled 2014-06-03: qty 1

## 2014-06-03 MED ORDER — ONDANSETRON 4 MG PO TBDP: 4.0000 mg | ORAL_TABLET | Freq: Three times a day (TID) | ORAL | Status: DC | PRN

## 2014-06-03 NOTE — ED Provider Notes (Signed)
CSN: [id number]     Arrival date & time 06/03/14  1203 History   First MD Initiated Contact with Patient 06/03/14 1219     Chief Complaint  Patient presents with  . Emesis     (Consider location/radiation/quality/duration/timing/severity/associated sxs/prior Treatment) HPI Comments: KATI RIGGENBACH is a 64 y.o. female with a PMHx of DM2 and recent sepsis secondary to R great toe infection s/p amputation, who presents to the ED with complaints of nausea and vomiting 1 day with subsequent development of generalized abdominal pain. She reports that she has had 8 episodes of nonbloody nonbilious emesis consisting of stomach contents beginning yesterday, and she has been unable to take her home medications. She states that after vomiting, she developed some crampy intermittent generalized abdominal pain which is a 9/10, only occurring with vomiting, with no medications tried prior to arrival for her pain or nausea. She recently started doxycycline on 05/30/14 for her foot, which underwent a great toe amputation on 05/08/14. She denies any fevers, chills, chest pain, shortness of breath, dysuria, hematuria, diarrhea, constipation, obstipation, hematochezia, melena, vaginal bleeding or discharge, numbness, tingling, weakness, headaches, sick contacts, suspicious food intake, recent travel, alcohol use, or NSAID use. She also denies any changes in her R leg, stating she has not had any erythema, swelling, or purulent drainage from her right foot.  Patient is a 64 y.o. female presenting with vomiting. The history is provided by the patient. No language interpreter was used.  Emesis Severity:  Moderate Duration:  1 day Timing:  Constant Number of daily episodes:  ~8x but unsure Quality:  Stomach contents Progression:  Unchanged Chronicity:  New Recent urination:  Normal Relieved by:  None tried Worsened by:  Nothing tried Ineffective treatments:  None tried Associated symptoms: abdominal pain (only with  vomiting)   Associated symptoms: no arthralgias, no chills, no diarrhea and no myalgias   Risk factors: diabetes   Risk factors: no sick contacts, no suspect food intake and no travel to endemic areas   Risk factors comment:  Recently started on doxycycline   Past Medical History  Diagnosis Date  . Diabetes mellitus    Past Surgical History  Procedure Laterality Date  . Arm surgery     . Cesarean section    . I&d extremity Right 05/08/2014    Procedure: IRRIGATION AND DEBRIDEMENT FOOT;  Surgeon: Newt Minion, MD;  Location: [facility];  Service: Orthopedics;  Laterality: Right;  . Amputation Right 05/08/2014    Procedure: AMPUTATION RAY, right great toe;  Surgeon: Newt Minion, MD;  Location: [facility];  Service: Orthopedics;  Laterality: Right;   Family History  Problem Relation Age of Onset  . Diabetes Other    History  Substance Use Topics  . Smoking status: Never Smoker   . Smokeless tobacco: Not on file  . Alcohol Use: No   OB History    No data available     Review of Systems  Constitutional: Negative for fever and chills.  Respiratory: Negative for shortness of breath.   Cardiovascular: Negative for chest pain and leg swelling.  Gastrointestinal: Positive for nausea, vomiting and abdominal pain (only with vomiting). Negative for diarrhea, constipation and blood in stool.  Genitourinary: Negative for dysuria, hematuria, flank pain, vaginal bleeding and vaginal discharge.  Musculoskeletal: Negative for myalgias and arthralgias.  Skin: Negative for color change (no erythema or warmth to RLE, no purulent drainage).  Allergic/Immunologic: Positive for immunocompromised state (diabetic).  Neurological: Negative for weakness and numbness.  Psychiatric/Behavioral: Negative for confusion.   10 Systems reviewed and are negative for acute change except as noted in the HPI.    Allergies  Tramadol  Home Medications   Prior to Admission medications   Medication Sig Start Date End  Date Taking? Authorizing Provider  Amino Acids-Protein Hydrolys (FEEDING SUPPLEMENT, PRO-STAT SUGAR FREE 64,) LIQD Take 30 mLs by mouth 2 (two) times daily. 05/16/14   Thurnell Lose, MD  docusate sodium (COLACE) 100 MG capsule Take 1 capsule (100 mg total) by mouth 2 (two) times daily. 05/16/14   Thurnell Lose, MD  ferrous sulfate 325 (65 FE) MG tablet Take 1 tablet (325 mg total) by mouth daily with breakfast. 05/16/14   Thurnell Lose, MD  insulin aspart (NOVOLOG) 100 UNIT/ML injection Before each meal 3 times a day, 140-199 - 2 units, 200-250 - 4 units, 251-299 - 6 units,  300-349 - 8 units,  350 or above 10 units. Insulin PEN if approved, provide syringes and needles if needed. 05/16/14   Thurnell Lose, MD  insulin detemir (LEVEMIR) 100 UNIT/ML injection Inject 0.28 mLs (28 Units total) into the skin at bedtime. 05/16/14   Thurnell Lose, MD  isosorbide-hydrALAZINE (BIDIL) 20-37.5 MG per tablet Take 1 tablet by mouth 2 (two) times daily. 05/16/14   Thurnell Lose, MD  metoprolol tartrate (LOPRESSOR) 25 MG tablet Take 0.5 tablets (12.5 mg total) by mouth 2 (two) times daily. 05/16/14   Thurnell Lose, MD  oxyCODONE-acetaminophen (PERCOCET/ROXICET) 5-325 MG per tablet Take 1 tablet by mouth every 8 (eight) hours as needed for severe pain. 05/16/14   Thurnell Lose, MD  pantoprazole (PROTONIX) 40 MG tablet Take 1 tablet (40 mg total) by mouth daily at 12 noon. 05/16/14   Thurnell Lose, MD   BP 188/85 mmHg  Pulse 96  Temp(Src) 98.6 F (37 C)  Resp 15  Ht _0  (1.6 m)  Wt 188 lb (85.276 kg)  BMI 33.31 kg/m2  SpO2 97% Physical Exam  Constitutional: She is oriented to person, place, and time. Vital signs are normal. She appears well-developed and well-nourished.  Non-toxic appearance. No distress.  Afebrile, nontoxic, NAD  HENT:  Head: Normocephalic and atraumatic.  Mouth/Throat: Oropharynx is clear and moist and mucous membranes are normal.  Eyes: Conjunctivae and EOM are normal. Right  eye exhibits no discharge. Left eye exhibits no discharge.  Neck: Normal range of motion. Neck supple.  Cardiovascular: Normal rate, regular rhythm, normal heart sounds and intact distal pulses.  Exam reveals no gallop and no friction rub.   No murmur heard. Pulmonary/Chest: Effort normal and breath sounds normal. No respiratory distress. She has no decreased breath sounds. She has no wheezes. She has no rhonchi. She has no rales.  Abdominal: Soft. Normal appearance and bowel sounds are normal. She exhibits no distension. There is no tenderness. There is no rigidity, no rebound, no guarding, no CVA tenderness, no tenderness at McBurney's point and negative Murphy's sign.  Soft, NTND, +BS throughout, no r/g/r, neg murphy's, neg mcburney's, no CVA TTP   Musculoskeletal: Normal range of motion.  R foot and ankle wrapped with bandages from R great toe amputation, no purulent drainage noted. No warmth, erythema, or swelling noted to leg proximal to bandages. See skin exam below for further documentation of wound to foot  Neurological: She is alert and oriented to person, place, and time. She has normal strength. No sensory deficit.  Skin: Skin is warm and dry. No rash  noted. No erythema.  R great toe s/p amputation with sutures present, no erythema or purulent drainage. Appears to be well healing.   Psychiatric: She has a normal mood and affect.  Nursing note and vitals reviewed.   ED Course  Procedures (including critical care time) Labs Review Labs Reviewed  CBC WITH DIFFERENTIAL/PLATELET - Abnormal; Notable for the following:    RBC 3.83 (*)    Hemoglobin 10.1 (*)    HCT 32.3 (*)    All other components within normal limits  COMPREHENSIVE METABOLIC PANEL - Abnormal; Notable for the following:    Glucose, Bld 336 (*)    Creatinine, Ser 1.26 (*)    Albumin 3.0 (*)    Alkaline Phosphatase 158 (*)    GFR calc non Af Amer 45 (*)    GFR calc Af Amer 52 (*)    All other components within  normal limits  URINALYSIS, ROUTINE W REFLEX MICROSCOPIC - Abnormal; Notable for the following:    Glucose, UA >1000 (*)    Hgb urine dipstick MODERATE (*)    Bilirubin Urine SMALL (*)    Ketones, ur >80 (*)    Protein, ur >300 (*)    All other components within normal limits  URINE MICROSCOPIC-ADD ON - Abnormal; Notable for the following:    Squamous Epithelial / LPF MANY (*)    All other components within normal limits  CBG MONITORING, ED - Abnormal; Notable for the following:    Glucose-Capillary 304 (*)    All other components within normal limits  LIPASE, BLOOD  I-STAT TROPOININ, ED  I-STAT TROPOININ, ED    Imaging Review No results found.   EKG Interpretation   Date/Time:  Sunday June 03 2014 12:48:43 EDT Ventricular Rate:  97 PR Interval:  144 QRS Duration: 83 QT Interval:  342 QTC Calculation: 434 R Axis:   102 Text Interpretation:  Sinus rhythm Right axis deviation Low voltage,  precordial leads Baseline wander in lead(s) I III aVL V1 V2 Confirmed by  ZAVITZ  MD, JOSHUA ([id number]) on 06/03/2014 3:23:54 PM      MDM   Final diagnoses:  Non-intractable vomiting with nausea, vomiting of unspecified type  Hyperglycemia due to type 2 diabetes mellitus  HTN (hypertension), benign  Generalized abdominal pain  Medication intolerance    64 y.o. female here with N/V x1 day, then developed abd pain from retching. Recently started doxycycline on 05/30/14 for foot infection (s/p amputation). Unable to tolerate PO, has not taken her home meds including hydralazine and metoprolol for BP. Mildly hypertensive here at 180s/80s. Will give zofran, morphine, and if pt able to tolerate PO will give her home BP meds to avoid sudden drop of BP from IV meds. No CP at this time, but given her DM2 and presenting symptom of nausea, could be cardiac equivalent therefore will obtain trop and EKG. Will hold on imaging since pt has no abd tenderness on exam and has bowel sounds throughout, doubt  obstruction. Likely side effect from doxycycline. Will reassess shortly.   3:19 PM Pt feels greatly improved, BP now 156/73, HR 89. Tolerating PO well at this time. CBC showing baseline anemia. CMP showing Ct 1.26 (near baseline), alk phos 158 but no AST/ALT changes, likely from bony source given her recent toe amputation. Glucose 336, no bicarb changes or anion gap. U/A shows some dehydration, ketones and protein but doubt DKA. Contaminated, many squamous, 3-6 WBC, with rare bacteria, doubt UTI. Lipase WNL. EKG with subtle ST flattening compared  to other although could be poor quality EKG. Had previously ordered troponin but this was cancelled, unclear why troponin was cancelled but will make sure this is performed. Will give bolus fluids and home insulin dose, then likely d/c home with zofran.   4:13 PM Trop neg. Will switch abx to bactrim/keflex since she isn't tolerating doxycycline. zofran rx given. Discussed good hydration. Will have her f/up with PCP in 3-5 days. Discussed use of tylenol as needed for pain. I explained the diagnosis and have given explicit precautions to return to the ER including for any other new or worsening symptoms. The patient understands and accepts the medical plan as it's been dictated and I have answered their questions. Discharge instructions concerning home care and prescriptions have been given. The patient is STABLE and is discharged to home in good condition.  BP 156/73 mmHg  Pulse 89  Temp(Src) 98.6 F (37 C)  Resp 19  Ht _0  (1.6 m)  Wt 188 lb (85.276 kg)  BMI 33.31 kg/m2  SpO2 96%  Meds ordered this encounter  Medications  . ondansetron (ZOFRAN) injection 4 mg    Sig:   . morphine 4 MG/ML injection 4 mg    Sig:   . sodium chloride 0.9 % bolus 1,000 mL    Sig:   . insulin aspart (novoLOG) injection 5 Units    Sig:   . cephALEXin (KEFLEX) 500 MG capsule    Sig: 1 cap po bid x 7 days    Dispense:  14 capsule    Refill:  0    Order Specific  Question:  Supervising Provider    Answer:  MILLER, [place]  . sulfamethoxazole-trimethoprim (BACTRIM DS,SEPTRA DS) 800-160 MG per tablet    Sig: Take 1 tablet by mouth 2 (two) times daily.    Dispense:  14 tablet    Refill:  0    Order Specific Question:  Supervising Provider    Answer:  MILLER, BRIAN [[id number]]  . ondansetron (ZOFRAN ODT) 4 MG disintegrating tablet    Sig: Take 1 tablet (4 mg total) by mouth every 8 (eight) hours as needed for nausea or vomiting.    Dispense:  15 tablet    Refill:  0    Order Specific Question:  Supervising Provider    Answer:  Noemi Chapel [[id number]]     Tiegan Jambor Camprubi-Soms, PA-C 06/03/14 1615  Elnora Morrison, MD 06/03/14 2147

## 2014-06-03 NOTE — Discharge Instructions (Signed)
Your nausea/vomiting is likely from the antibiotic you started taking recently for your infection. Stop taking doxycycline and start taking Bactrim and Keflex as directed. Use zofran as directed, as needed for nausea. Stay well hydrated. Take all your regular home medications. Use tylenol as needed for pain. See your regular doctor in 1 week for recheck of symptoms. Check your blood sugar when you get home, and regularly, taking your insulin regularly. Return to the ER for changes or worsening symptoms.   Nausea and Vomiting Nausea means you feel sick to your stomach. Throwing up (vomiting) is a reflex where stomach contents come out of your mouth. HOME CARE   Take medicine as told by your doctor.  Do not force yourself to eat. However, you do need to drink fluids.  If you feel like eating, eat a normal diet as told by your doctor.  Eat rice, wheat, potatoes, bread, lean meats, yogurt, fruits, and vegetables.  Avoid high-fat foods.  Drink enough fluids to keep your pee (urine) clear or pale yellow.  Ask your doctor how to replace body fluid losses (rehydrate). Signs of body fluid loss (dehydration) include:  Feeling very thirsty.  Dry lips and mouth.  Feeling dizzy.  Dark pee.  Peeing less than normal.  Feeling confused.  Fast breathing or heart rate. GET HELP RIGHT AWAY IF:   You have blood in your throw up.  You have black or bloody poop (stool).  You have a bad headache or stiff neck.  You feel confused.  You have bad belly (abdominal) pain.  You have chest pain or trouble breathing.  You do not pee at least once every 8 hours.  You have cold, clammy skin.  You keep throwing up after 24 to 48 hours.  You have a fever. MAKE SURE YOU:   Understand these instructions.  Will watch your condition.  Will get help right away if you are not doing well or get worse. Document Released: 08/12/2007 Document Revised: 05/18/2011 Document Reviewed:  07/25/2010 [facility] Patient Information 2015 [place], [place]. This information is not intended to replace advice given to you by your health care provider. Make sure you discuss any questions you have with your health care provider.  Hypertension Hypertension is another name for high blood pressure. High blood pressure forces your heart to work harder to pump blood. A blood pressure reading has two numbers, which includes a higher number over a lower number (example: 110/72). HOME CARE   Have your blood pressure rechecked by your doctor.  Only take medicine as told by your doctor. Follow the directions carefully. The medicine does not work as well if you skip doses. Skipping doses also puts you at risk for problems.  Do not smoke.  Monitor your blood pressure at home as told by your doctor. GET HELP IF:  You think you are having a reaction to the medicine you are taking.  You have repeat headaches or feel dizzy.  You have puffiness (swelling) in your ankles.  You have trouble with your vision. GET HELP RIGHT AWAY IF:   You get a very bad headache and are confused.  You feel weak, numb, or faint.  You get chest or belly (abdominal) pain.  You throw up (vomit).  You cannot breathe very well. MAKE SURE YOU:   Understand these instructions.  Will watch your condition.  Will get help right away if you are not doing well or get worse. Document Released: 08/12/2007 Document Revised: 02/28/2013 Document Reviewed: 12/16/2012  [employer] Patient Information 2015 [place], [place]. This information is not intended to replace advice given to you by your health care provider. Make sure you discuss any questions you have with your health care provider.  Hyperglycemia Hyperglycemia occurs when the glucose (sugar) in your blood is too high. Hyperglycemia can happen for many reasons, but it most often happens to people who do not know they have diabetes or are not managing their diabetes  properly.  CAUSES  Whether you have diabetes or not, there are other causes of hyperglycemia. Hyperglycemia can occur when you have diabetes, but it can also occur in other situations that you might not be as aware of, such as: Diabetes  If you have diabetes and are having problems controlling your blood glucose, hyperglycemia could occur because of some of the following reasons:  Not following your meal plan.  Not taking your diabetes medications or not taking it properly.  Exercising less or doing less activity than you normally do.  Being sick. Pre-diabetes  This cannot be ignored. Before people develop Type 2 diabetes, they almost always have "pre-diabetes." This is when your blood glucose levels are higher than normal, but not yet high enough to be diagnosed as diabetes. Research has shown that some long-term damage to the body, especially the heart and circulatory system, may already be occurring during pre-diabetes. If you take action to manage your blood glucose when you have pre-diabetes, you may delay or prevent Type 2 diabetes from developing. Stress  If you have diabetes, you may be "diet" controlled or on oral medications or insulin to control your diabetes. However, you may find that your blood glucose is higher than usual in the hospital whether you have diabetes or not. This is often referred to as "stress hyperglycemia." Stress can elevate your blood glucose. This happens because of hormones put out by the body during times of stress. If stress has been the cause of your high blood glucose, it can be followed regularly by your caregiver. That way he/she can make sure your hyperglycemia does not continue to get worse or progress to diabetes. Steroids  Steroids are medications that act on the infection fighting system (immune system) to block inflammation or infection. One side effect can be a rise in blood glucose. Most people can produce enough extra insulin to allow for this  rise, but for those who cannot, steroids make blood glucose levels go even higher. It is not unusual for steroid treatments to "uncover" diabetes that is developing. It is not always possible to determine if the hyperglycemia will go away after the steroids are stopped. A special blood test called an A1c is sometimes done to determine if your blood glucose was elevated before the steroids were started. SYMPTOMS  Thirsty.  Frequent urination.  Dry mouth.  Blurred vision.  Tired or fatigue.  Weakness.  Sleepy.  Tingling in feet or leg. DIAGNOSIS  Diagnosis is made by monitoring blood glucose in one or all of the following ways:  A1c test. This is a chemical found in your blood.  Fingerstick blood glucose monitoring.  Laboratory results. TREATMENT  First, knowing the cause of the hyperglycemia is important before the hyperglycemia can be treated. Treatment may include, but is not be limited to:  Education.  Change or adjustment in medications.  Change or adjustment in meal plan.  Treatment for an illness, infection, etc.  More frequent blood glucose monitoring.  Change in exercise plan.  Decreasing or stopping steroids.  Lifestyle changes.  HOME CARE INSTRUCTIONS   Test your blood glucose as directed.  Exercise regularly. Your caregiver will give you instructions about exercise. Pre-diabetes or diabetes which comes on with stress is helped by exercising.  Eat wholesome, balanced meals. Eat often and at regular, fixed times. Your caregiver or nutritionist will give you a meal plan to guide your sugar intake.  Being at an ideal weight is important. If needed, losing as little as 10 to 15 pounds may help improve blood glucose levels. SEEK MEDICAL CARE IF:   You have questions about medicine, activity, or diet.  You continue to have symptoms (problems such as increased thirst, urination, or weight gain). SEEK IMMEDIATE MEDICAL CARE IF:   You are vomiting or have  diarrhea.  Your breath smells fruity.  You are breathing faster or slower.  You are very sleepy or incoherent.  You have numbness, tingling, or pain in your feet or hands.  You have chest pain.  Your symptoms get worse even though you have been following your caregiver's orders.  If you have any other questions or concerns. Document Released: 08/19/2000 Document Revised: 05/18/2011 Document Reviewed: 06/22/2011 [facility] Patient Information 2015 [place], [place]. This information is not intended to replace advice given to you by your health care provider. Make sure you discuss any questions you have with your health care provider.  How to Avoid Diabetes Problems You can do a lot to prevent or slow down diabetes problems. Following your diabetes plan and taking care of yourself can reduce your risk of serious or life-threatening complications. Below, you will find certain things you can do to prevent diabetes problems. MANAGE YOUR DIABETES Follow your health care provider's, nurse educator's, and dietitian's instructions for managing your diabetes. They will teach you the basics of diabetes care. They can help answer questions you may have. Learn about diabetes and make healthy choices regarding eating and physical activity. Monitor your blood glucose level regularly. Your health care provider will help you decide how often to check your blood glucose level depending on your treatment goals and how well you are meeting them.  DO NOT USE NICOTINE Nicotine and diabetes are a dangerous combination. Nicotine raises your risk for diabetes problems. If you quit using nicotine, you will lower your risk for heart attack, stroke, nerve disease, and kidney disease. Your cholesterol and your blood pressure levels may improve. Your blood circulation will also improve. Do not use any tobacco products, including cigarettes, chewing tobacco, or electronic cigarettes. If you need help quitting, ask your health  care provider. KEEP YOUR BLOOD PRESSURE UNDER CONTROL Keeping your blood pressure under control will help prevent damage to your eyes, kidneys, heart, and blood vessels. Blood pressure consists of two numbers. The top number should be below 120, and the bottom number should be below 80 (120/80). Keep your blood pressure as close to these numbers as you can. If you already have kidney disease, you may want even lower blood pressure to protect your kidneys. Talk to your health care provider to make sure that your blood pressure goal is right for your needs. Meal planning, medicines, and exercise can help you reach your blood pressure target. Have your blood pressure checked at every visit with your health care provider. KEEP YOUR CHOLESTEROL UNDER CONTROL Normal cholesterol levels will help prevent heart disease and stroke. These are the biggest health problems for people with diabetes. Keeping cholesterol levels under control can also help with blood flow. Have your cholesterol level checked at least  once a year. Your health care provider may prescribe a medicine known as a statin. Statins lower your cholesterol. If you are not taking a statin, ask your health care provider if you should be. Meal planning, exercise, and medicines can help you reach your cholesterol targets.  SCHEDULE AND KEEP YOUR ANNUAL PHYSICAL EXAMS AND EYE EXAMS Your health care provider will tell you how often he or she wants to see you depending on your plan of treatment. It is important that you keep these appointments so that possible problems can be identified early and complications can be avoided or treated.  Every visit with your health care provider should include your weight, blood pressure, and an evaluation of your blood glucose control.  Your hemoglobin A1c should be checked:  At least twice a year if you are at your goal.  Every 3 months if there are changes in treatment.  If you are not meeting your goals.  Your  blood lipids should be checked yearly. You should also be checked yearly to see if you have protein in your urine (microalbumin).  Schedule a dilated eye exam within 5 years of your diagnosis if you have type 1 diabetes, and then yearly. Schedule a dilated eye exam at diagnosis if you have type 2 diabetes, and then yearly. All exams thereafter can be extended to every 2 to 3 years if one or more exams have been normal. KEEP YOUR VACCINES CURRENT The flu vaccine is recommended yearly. The formula for the vaccine changes every year and needs to be updated for the best protection against current viruses. It is recommended that people with diabetes who are over 818 years old get the pneumonia vaccine. In some cases, two separate shots may be given. Ask your health care provider if your pneumonia vaccination is up-to-date. However, there are some instances where another vaccine is recommended. Check with your health care provider. TAKE CARE OF YOUR FEET  Diabetes may cause you to have a poor blood supply (circulation) to your legs and feet. Because of this, the skin may be thinner, break easier, and heal more slowly. You also may have nerve damage in your legs and feet, causing decreased feeling. You may not notice minor injuries to your feet that could lead to serious problems or infections. Taking care of your feet is very important. Visual foot exams are performed at every routine medical visit. The exams check for cuts, injuries, or other problems with the feet. A comprehensive foot exam should be done yearly. This includes visual inspection as well as assessing foot pulses and testing for loss of sensation. You should also do the following:  Inspect your feet daily for cuts, calluses, blisters, ingrown toenails, and signs of infection, such as redness, swelling, or pus.  Wash and dry your feet thoroughly, especially between the toes.  Avoid soaking your feet regularly in hot water baths.  Moisturize  dry skin with lotion, avoiding areas between your toes.  Cut toenails straight across and file the edges.  Avoid shoes that do not fit well or have areas that irritate your skin.  Avoid going barefooted or wearing only socks. Your feet need protection. TAKE CARE OF YOUR TEETH People with poorly controlled diabetes are more likely to have gum (periodontal) disease. These infections make diabetes harder to control. Periodontal diseases, if left untreated, can lead to tooth loss. Brush your teeth twice a day, floss, and see your dentist for checkups and cleaning every 6 months, or 2 times  a year. ASK YOUR HEALTH CARE PROVIDER ABOUT TAKING ASPIRIN Taking aspirin daily is recommended to help prevent cardiovascular disease in people with and without diabetes. Ask your health care provider if this would benefit you and what dose he or she would recommend. DRINK RESPONSIBLY Moderate amounts of alcohol (less than 1 drink per day for adult women and less than 2 drinks per day for adult men) have a minimal effect on blood glucose if ingested with food. It is important to eat food with alcohol to avoid hypoglycemia. People should avoid alcohol if they have a history of alcohol abuse or dependence, if they are pregnant, and if they have liver disease, pancreatitis, advanced neuropathy, or severe hypertriglyceridemia. LESSEN STRESS Living with diabetes can be stressful. When you are under stress, your blood glucose may be affected in two ways:  Stress hormones may cause your blood glucose to rise.  You may be distracted from taking good care of yourself. It is a good idea to be aware of your stress level and make changes that are necessary to help you better manage challenging situations. Support groups, planned relaxation, a hobby you enjoy, meditation, healthy relationships, and exercise all work to lower your stress level. If your efforts do not seem to be helping, get help from your health care provider or  a trained mental health professional. Document Released: 11/11/2010 Document Revised: 07/10/2013 Document Reviewed: 04/19/2013 [facility] Patient Information 2015 [place], [place]. This information is not intended to replace advice given to you by your health care provider. Make sure you discuss any questions you have with your health care provider.

## 2014-06-03 NOTE — ED Notes (Signed)
She c/o n/v x 2 days. States she has been unable to tolerate any oral intake including her daily meds. Denies bowel/bladder changes. C/o abd pain

## 2014-06-05 ENCOUNTER — Ambulatory Visit: Payer: PRIVATE HEALTH INSURANCE | Admitting: *Deleted

## 2014-06-06 ENCOUNTER — Telehealth: Payer: Self-pay | Admitting: Primary Care

## 2014-06-06 ENCOUNTER — Encounter: Payer: Self-pay | Admitting: Primary Care

## 2014-06-06 ENCOUNTER — Ambulatory Visit (INDEPENDENT_AMBULATORY_CARE_PROVIDER_SITE_OTHER): Payer: 59 | Admitting: Primary Care

## 2014-06-06 VITALS — BP 142/68 | HR 72 | Temp 98.4°F | Ht 62.0 in | Wt 185.0 lb

## 2014-06-06 DIAGNOSIS — E669 Obesity, unspecified: Secondary | ICD-10-CM | POA: Diagnosis not present

## 2014-06-06 DIAGNOSIS — E1165 Type 2 diabetes mellitus with hyperglycemia: Secondary | ICD-10-CM

## 2014-06-06 DIAGNOSIS — I10 Essential (primary) hypertension: Secondary | ICD-10-CM

## 2014-06-06 DIAGNOSIS — E1140 Type 2 diabetes mellitus with diabetic neuropathy, unspecified: Secondary | ICD-10-CM | POA: Diagnosis not present

## 2014-06-06 DIAGNOSIS — IMO0002 Reserved for concepts with insufficient information to code with codable children: Secondary | ICD-10-CM

## 2014-06-06 DIAGNOSIS — D6489 Other specified anemias: Secondary | ICD-10-CM

## 2014-06-06 NOTE — Progress Notes (Signed)
Subjective:    Patient ID: Rachel Gonzalez, female    DOB: 04/12/1950, 64 y.o.   MRN: [id number]  HPI  Rachel Gonzalez is a 64 year old female who presents today to establish care and discuss the problems mentioned below. Will obtain old records.  Last time she's been managed by a PCP was 3 years ago. She was hospitalized in late February-early March for infected great toe. It was noted then that she was found to have hyperglycemia, hypertension, and anemia.  1) Hypertension: She's currently managed on metoprolol, hydralazine, and isosorbide dinitrate and was placed on these medications in the hospital and SNF. She's been taking these medications since 05/07/2014. Denies headaches, chest pain, changes in vision. She does not have a blood pressure cuff to check at home. Reports overall fluid retention over past several weeks which is slowly resolving.   BP Readings from Last 3 Encounters:  06/06/14 142/68  06/03/14 135/60  05/28/14 125/54    2) Diabetes Type 2: Diagnosed in 1989 and had been managed off and on with oral tablets. She's not felt bad or experienced polyuria, dipsia, or phagia, dizziness, or weakness over the past several years and did not realize that her sugars were uncontrolled. She was placed on Levemir 28 units at bedtime and and Novolog sliding scale. She's checking her blood sugar prior to injecting insulin and is getting readings of 198, 189 on average and 212 this morning. She's been taking only 20 units of Levemir due to confusion at dischage. Denies numbness or tingling to feet, does report to her finger tips occasionally. Last eye exam was 2 years ago.   3) Anemia: This was first discovered 3 weeks ago while in the hospital for which she required a blood transfusion. Post discharge she has improved and her hemoglobin is 10.1. Denies weakness, fatigue, briusing.  4) Obesity: Diet consists of fast food, she eats some vegetables, fruits, sweets, breads. Exercising: Not  exercising.   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 Lajoyce Cornersuda and [facility]. She underwent PT and OT and is using a walker today. She has a post-op shoe present to right foot. Surgery was 05/07/14. Follow up with Dr. Lajoyce Cornersuda is tomorrow.   Review of Systems  Constitutional: Negative for fatigue and unexpected weight change.  HENT: Negative for rhinorrhea.   Respiratory: Negative for shortness of breath.   Cardiovascular: Positive for leg swelling. Negative for chest pain.       Leg swelling improving since surgery.  Gastrointestinal: Positive for constipation. Negative for abdominal pain and diarrhea.  Genitourinary: Negative for dysuria and frequency.  Musculoskeletal: Negative for myalgias and arthralgias.  Neurological: Negative for dizziness, numbness and headaches.  Psychiatric/Behavioral:       Denies concerns for anxiety or depression.       Past Medical History  Diagnosis Date  . Diabetes mellitus   . Hypertension   . Anemia     History   Social History  . Marital Status: Married    Spouse Name: N/A  . Number of Children: N/A  . Years of Education: N/A   Occupational History  . Not on file.   Social History Main Topics  . Smoking status: Never Smoker   . Smokeless tobacco: Not on file  . Alcohol Use: No  . Drug Use: No  . Sexual Activity: Not on file   Other Topics Concern  . Not on file   Social History  Narrative   From [place].   Married.   Three children, 9 grandchildren.   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   Travels to [place], [place]       Past Surgical History  Procedure Laterality Date  . Arm surgery     . Cesarean section    . I&d extremity Right 05/08/2014    Procedure: IRRIGATION AND DEBRIDEMENT FOOT;  Surgeon: Nadara Mustard, MD;  Location: [facility] OR;  Service: Orthopedics;  Laterality: Right;  . Amputation Right 05/08/2014    Procedure:  AMPUTATION RAY, right great toe;  Surgeon: Nadara Mustard, MD;  Location: [facility] OR;  Service: Orthopedics;  Laterality: Right;    Family History  Problem Relation Age of Onset  . Diabetes Mother     Allergies  Allergen Reactions  . Tramadol Nausea And Vomiting    Current Outpatient Prescriptions on File Prior to Visit  Medication Sig Dispense Refill  . Amino Acids-Protein Hydrolys (FEEDING SUPPLEMENT, PRO-STAT SUGAR FREE 64,) LIQD Take 30 mLs by mouth 2 (two) times daily. 900 mL 0  . cephALEXin (KEFLEX) 500 MG capsule 1 cap po bid x 7 days 14 capsule 0  . docusate sodium (COLACE) 100 MG capsule Take 1 capsule (100 mg total) by mouth 2 (two) times daily. 10 capsule 0  . doxycycline (VIBRAMYCIN) 100 MG capsule Take 100 mg by mouth 2 (two) times daily.    . ferrous sulfate 325 (65 FE) MG tablet Take 1 tablet (325 mg total) by mouth daily with breakfast.  3  . hydrALAZINE (APRESOLINE) 25 MG tablet Take 37.5 mg by mouth 2 (two) times daily.    . insulin aspart (NOVOLOG) 100 UNIT/ML injection Before each meal 3 times a day, 140-199 - 2 units, 200-250 - 4 units, 251-299 - 6 units,  300-349 - 8 units,  350 or above 10 units. Insulin PEN if approved, provide syringes and needles if needed. 10 mL 11  . insulin detemir (LEVEMIR) 100 UNIT/ML injection Inject 0.28 mLs (28 Units total) into the skin at bedtime. 10 mL 11  . isosorbide dinitrate (ISORDIL) 30 MG tablet Take 30 mg by mouth 2 (two) times daily.    . isosorbide-hydrALAZINE (BIDIL) 20-37.5 MG per tablet Take 1 tablet by mouth 2 (two) times daily.    . metoprolol tartrate (LOPRESSOR) 25 MG tablet Take 0.5 tablets (12.5 mg total) by mouth 2 (two) times daily.    . ondansetron (ZOFRAN ODT) 4 MG disintegrating tablet Take 1 tablet (4 mg total) by mouth every 8 (eight) hours as needed for nausea or vomiting. 15 tablet 0  . oxyCODONE-acetaminophen (PERCOCET/ROXICET) 5-325 MG per tablet Take 1 tablet by mouth every 8 (eight) hours as needed for severe  pain. (Patient taking differently: Take 1 tablet by mouth every 8 (eight) hours as needed for moderate pain or severe pain. ) 15 tablet 0  . pantoprazole (PROTONIX) 40 MG tablet Take 1 tablet (40 mg total) by mouth daily at 12 noon.    . penicillin v potassium (VEETID) 500 MG tablet Take 500 mg by mouth 3 (three) times daily.    Marland Kitchen pyridOXINE (VITAMIN B-6) 25 MG tablet Take 25 mg by mouth 3 (three) times daily.    Marland Kitchen sulfamethoxazole-trimethoprim (BACTRIM DS,SEPTRA DS) 800-160 MG per tablet Take 1 tablet by mouth 2 (two) times daily. 14 tablet 0   No current facility-administered medications on file prior to visit.    BP 142/68 mmHg  Pulse 72  Temp(Src) 98.4 F (36.9 C) (Oral)  Ht  (1.575 m)  Wt 185 lb (83.915 kg)  BMI 33.83 kg/m2  SpO2 97%    Objective:   Physical Exam  Constitutional: She is oriented to person, place, and time. She appears well-developed.  HENT:  Head: Normocephalic.  Right Ear: External ear normal.  Left Ear: External ear normal.  Nose: Nose normal.  Mouth/Throat: Oropharynx is clear and moist.  Eyes: Conjunctivae and EOM are normal. Pupils are equal, round, and reactive to light.  Neck: Neck supple.  Cardiovascular: Normal rate and regular rhythm.   Pulmonary/Chest: Effort normal and breath sounds normal.  Abdominal: Soft. Bowel sounds are normal. She exhibits distension.  Lymphadenopathy:    She has no cervical adenopathy.  Neurological: She is alert and oriented to person, place, and time. No cranial nerve deficit.  Skin: Skin is warm and dry.  Psychiatric: She has a normal mood and affect.          Assessment & Plan:  >45 minutes spent face to face with patient, >50% spent counseling or coordinating care.

## 2014-06-06 NOTE — Assessment & Plan Note (Signed)
Improving. Required transfusion one week ago. Last Hemoglobin was 10.1 which was greatly improved from 7. She has not been taking an iron supplement due to confusion at discharge. Instructed to start taking 325mg  daily. Will continue to monitor.

## 2014-06-06 NOTE — Assessment & Plan Note (Signed)
Controlled at the moment with: Hydralizine, Isosorbide dinitrate, and metoprolol Will consider adding an ACE once renal function has further improvement. Repeat BMET in 2 weeks. She is to obtain a BP cuff and start recording readings.

## 2014-06-06 NOTE — Progress Notes (Signed)
Pre visit review using our clinic review tool, if applicable. No additional management support is needed unless otherwise documented below in the visit note. 

## 2014-06-06 NOTE — Assessment & Plan Note (Signed)
Spend a long time discussing healthy, diabetic diet. Gave a packet of information on meal planning and details of a diabetic diet.

## 2014-06-06 NOTE — Assessment & Plan Note (Signed)
Uncontrolled. Currently on Levemir 28 and Novolog sliding scale. The sliding scale is confusing for patient to manage at home, so will have her do 6 units at meals which is why she's been doing on average. She's only been taking 20 units of Levemir due to confusion at discharge. Increase her Levemir to 28 which is what she was getting at the nursing home. Record sugar readings and bring them to next appointment. Will refer for eye exam. Follow up in 2 weeks for re-evaluation.

## 2014-06-06 NOTE — Patient Instructions (Addendum)
Diabetes: Please start recording your blood sugars daily and bring them with you to your next appointment. Please call me if you get a sugar reading less than 80 or greater than 350.  Increase your Levemir to 28 units at bedtime. Change your Novolog to 6 units with meals, hold if you get a blood sugar less than 110. Work on a healthier diet with fresh fruits and vegetables. Reduce your sugar, breads, and salt intake.  High Blood Pressure: Obtain a blood pressure cuff from [facility], [facility], [facility], or any other drug store and check your blood pressure once daily. Please write down your readings and being them to your next appointment.   Anemia: You need to start taking an iron supplement for your Anemia. You can purchase this at any drug store. Look for Ferrous Sulfate 325mg . Take on tablet by mouth daily.  Constipation: You can take Colace over the counter daily for constipation.  Please schedule a follow up appointment with me to discuss your high blood pressure and diabetes in 2 weeks. We will re-check your kidney function through a blood test in 2 weeks.  Type 2 Diabetes Mellitus Type 2 diabetes mellitus, often simply referred to as type 2 diabetes, is a long-lasting (chronic) disease. In type 2 diabetes, the pancreas does not make enough insulin (a hormone), the cells are less responsive to the insulin that is made (insulin resistance), or both. Normally, insulin moves sugars from food into the tissue cells. The tissue cells use the sugars for energy. The lack of insulin or the lack of normal response to insulin causes excess sugars to build up in the blood instead of going into the tissue cells. As a result, high blood sugar (hyperglycemia) develops. The effect of high sugar (glucose) levels can cause many complications. Type 2 diabetes was also previously called adult-onset diabetes, but it can occur at any age.  RISK FACTORS  A person is predisposed to developing type 2 diabetes if  someone in the family has the disease and also has one or more of the following primary risk factors:  Overweight.  An inactive lifestyle.  A history of consistently eating high-calorie foods. Maintaining a normal weight and regular physical activity can reduce the chance of developing type 2 diabetes. SYMPTOMS  A person with type 2 diabetes may not show symptoms initially. The symptoms of type 2 diabetes appear slowly. The symptoms include:  Increased thirst (polydipsia).  Increased urination (polyuria).  Increased urination during the night (nocturia).  Weight loss. This weight loss may be rapid.  Frequent, recurring infections.  Tiredness (fatigue).  Weakness.  Vision changes, such as blurred vision.  Fruity smell to your breath.  Abdominal pain.  Nausea or vomiting.  Cuts or bruises which are slow to heal.  Tingling or numbness in the hands or feet. DIAGNOSIS Type 2 diabetes is frequently not diagnosed until complications of diabetes are present. Type 2 diabetes is diagnosed when symptoms or complications are present and when blood glucose levels are increased. Your blood glucose level may be checked by one or more of the following blood tests:  A fasting blood glucose test. You will not be allowed to eat for at least 8 hours before a blood sample is taken.  A random blood glucose test. Your blood glucose is checked at any time of the day regardless of when you ate.  A hemoglobin A1c blood glucose test. A hemoglobin A1c test provides information about blood glucose control over the previous 3 months.  An oral glucose tolerance test (OGTT). Your blood glucose is measured after you have not eaten (fasted) for 2 hours and then after you drink a glucose-containing beverage. TREATMENT   You may need to take insulin or diabetes medicine daily to keep blood glucose levels in the desired range.  If you use insulin, you may need to adjust the dosage depending on the  carbohydrates that you eat with each meal or snack. The treatment goal is to maintain the before meal blood sugar (preprandial glucose) level at 70-130 mg/dL. HOME CARE INSTRUCTIONS   Have your hemoglobin A1c level checked twice a year.  Perform daily blood glucose monitoring as directed by your health care provider.  Monitor urine ketones when you are ill and as directed by your health care provider.  Take your diabetes medicine or insulin as directed by your health care provider to maintain your blood glucose levels in the desired range.  Never run out of diabetes medicine or insulin. It is needed every day.  If you are using insulin, you may need to adjust the amount of insulin given based on your intake of carbohydrates. Carbohydrates can raise blood glucose levels but need to be included in your diet. Carbohydrates provide vitamins, minerals, and fiber which are an essential part of a healthy diet. Carbohydrates are found in fruits, vegetables, whole grains, dairy products, legumes, and foods containing added sugars.  Eat healthy foods. You should make an appointment to see a registered dietitian to help you create an eating plan that is right for you.  Lose weight if you are overweight.  Carry a medical alert card or wear your medical alert jewelry.  Carry a 15-gram carbohydrate snack with you at all times to treat low blood glucose (hypoglycemia). Some examples of 15-gram carbohydrate snacks include:  Glucose tablets, 3 or 4.  Glucose gel, 15-gram tube.  Raisins, 2 tablespoons (24 grams).  Jelly beans, 6.  Animal crackers, 8.  Regular pop, 4 ounces (120 mL).  Gummy treats, 9.  Recognize hypoglycemia. Hypoglycemia occurs with blood glucose levels of 70 mg/dL and below. The risk for hypoglycemia increases when fasting or skipping meals, during or after intense exercise, and during sleep. Hypoglycemia symptoms can include:  Tremors or shakes.  Decreased ability to  concentrate.  Sweating.  Increased heart rate.  Headache.  Dry mouth.  Hunger.  Irritability.  Anxiety.  Restless sleep.  Altered speech or coordination.  Confusion.  Treat hypoglycemia promptly. If you are alert and able to safely swallow, follow the 15:15 rule:  Take 15-20 grams of rapid-acting glucose or carbohydrate. Rapid-acting options include glucose gel, glucose tablets, or 4 ounces (120 mL) of fruit juice, regular soda, or low-fat milk.  Check your blood glucose level 15 minutes after taking the glucose.  Take 15-20 grams more of glucose if the repeat blood glucose level is still 70 mg/dL or below.  Eat a meal or snack within 1 hour once blood glucose levels return to normal.  Be alert to feeling very thirsty and urinating more frequently than usual, which are early signs of hyperglycemia. An early awareness of hyperglycemia allows for prompt treatment. Treat hyperglycemia as directed by your health care provider.  Engage in at least 150 minutes of moderate-intensity physical activity a week, spread over at least 3 days of the week or as directed by your health care provider. In addition, you should engage in resistance exercise at least 2 times a week or as directed by your health care provider.  Try to spend no more than 90 minutes at one time inactive.  Adjust your medicine and food intake as needed if you start a new exercise or sport.  Follow your sick-day plan anytime you are unable to eat or drink as usual.  Do not use any tobacco products including cigarettes, chewing tobacco, or electronic cigarettes. If you need help quitting, ask your health care provider.  Limit alcohol intake to no more than 1 drink per day for nonpregnant women and 2 drinks per day for men. You should drink alcohol only when you are also eating food. Talk with your health care provider whether alcohol is safe for you. Tell your health care provider if you drink alcohol several times a  week.  Keep all follow-up visits as directed by your health care provider. This is important.  Schedule an eye exam soon after the diagnosis of type 2 diabetes and then annually.  Perform daily skin and foot care. Examine your skin and feet daily for cuts, bruises, redness, nail problems, bleeding, blisters, or sores. A foot exam by a health care provider should be done annually.  Brush your teeth and gums at least twice a day and floss at least once a day. Follow up with your dentist regularly.  Share your diabetes management plan with your workplace or school.  Stay up-to-date with immunizations. It is recommended that people with diabetes who are over 63 years old get the pneumonia vaccine. In some cases, two separate shots may be given. Ask your health care provider if your pneumonia vaccination is up-to-date.  Learn to manage stress.  Obtain ongoing diabetes education and support as needed.  Participate in or seek rehabilitation as needed to maintain or improve independence and quality of life. Request a physical or occupational therapy referral if you are having foot or hand numbness, or difficulties with grooming, dressing, eating, or physical activity. SEEK MEDICAL CARE IF:   You are unable to eat food or drink fluids for more than 6 hours.  You have nausea and vomiting for more than 6 hours.  Your blood glucose level is over 240 mg/dL.  There is a change in mental status.  You develop an additional serious illness.  You have diarrhea for more than 6 hours.  You have been sick or have had a fever for a couple of days and are not getting better.  You have pain during any physical activity.  SEEK IMMEDIATE MEDICAL CARE IF:  You have difficulty breathing.  You have moderate to large ketone levels. MAKE SURE YOU:  Understand these instructions.  Will watch your condition.  Will get help right away if you are not doing well or get worse. Document Released:  02/23/2005 Document Revised: 07/10/2013 Document Reviewed: 09/22/2011 [facility] Patient Information 2015 [place]. This information is not intended to replace advice given to you by your health care provider. Make sure you discuss any questions you have with your health care provider.  Hypertension Hypertension, commonly called high blood pressure, is when the force of blood pumping through your arteries is too strong. Your arteries are the blood vessels that carry blood from your heart throughout your body. A blood pressure reading consists of a higher number over a lower number, such as 110/72. The higher number (systolic) is the pressure inside your arteries when your heart pumps. The lower number (diastolic) is the pressure inside your arteries when your heart relaxes. Ideally you want your blood pressure below 120/80. Hypertension forces your heart  to work harder to pump blood. Your arteries may become narrow or stiff. Having hypertension puts you at risk for heart disease, stroke, and other problems.  RISK FACTORS Some risk factors for high blood pressure are controllable. Others are not.  Risk factors you cannot control include:   Race. You may be at higher risk if you are African American.  Age. Risk increases with age.  Gender. Men are at higher risk than women before age 79 years. After age 12, women are at higher risk than men. Risk factors you can control include:  Not getting enough exercise or physical activity.  Being overweight.  Getting too much fat, sugar, calories, or salt in your diet.  Drinking too much alcohol. SIGNS AND SYMPTOMS Hypertension does not usually cause signs or symptoms. Extremely high blood pressure (hypertensive crisis) may cause headache, anxiety, shortness of breath, and nosebleed. DIAGNOSIS  To check if you have hypertension, your health care provider will measure your blood pressure while you are seated, with your arm held at the level of your  heart. It should be measured at least twice using the same arm. Certain conditions can cause a difference in blood pressure between your right and left arms. A blood pressure reading that is higher than normal on one occasion does not mean that you need treatment. If one blood pressure reading is high, ask your health care provider about having it checked again. TREATMENT  Treating high blood pressure includes making lifestyle changes and possibly taking medicine. Living a healthy lifestyle can help lower high blood pressure. You may need to change some of your habits. Lifestyle changes may include:  Following the DASH diet. This diet is high in fruits, vegetables, and whole grains. It is low in salt, red meat, and added sugars.  Getting at least 2 hours of brisk physical activity every week.  Losing weight if necessary.  Not smoking.  Limiting alcoholic beverages.  Learning ways to reduce stress. If lifestyle changes are not enough to get your blood pressure under control, your health care provider may prescribe medicine. You may need to take more than one. Work closely with your health care provider to understand the risks and benefits. HOME CARE INSTRUCTIONS  Have your blood pressure rechecked as directed by your health care provider.   Take medicines only as directed by your health care provider. Follow the directions carefully. Blood pressure medicines must be taken as prescribed. The medicine does not work as well when you skip doses. Skipping doses also puts you at risk for problems.   Do not smoke.   Monitor your blood pressure at home as directed by your health care provider. SEEK MEDICAL CARE IF:   You think you are having a reaction to medicines taken.  You have recurrent headaches or feel dizzy.  You have swelling in your ankles.  You have trouble with your vision. SEEK IMMEDIATE MEDICAL CARE IF:  You develop a severe headache or confusion.  You have unusual  weakness, numbness, or feel faint.  You have severe chest or abdominal pain.  You vomit repeatedly.  You have trouble breathing. MAKE SURE YOU:   Understand these instructions.  Will watch your condition.  Will get help right away if you are not doing well or get worse. Document Released: 02/23/2005 Document Revised: 07/10/2013 Document Reviewed: 12/16/2012 [facility] Patient Information 2015 [place], [place]. This information is not intended to replace advice given to you by your health care provider. Make sure you discuss any  questions you have with your health care provider.  

## 2014-06-06 NOTE — Telephone Encounter (Signed)
emmi mailed  °

## 2014-06-06 NOTE — Assessment & Plan Note (Signed)
Present to left foot. Decrease sensation with monofillament upon foot exam. Her right foot is post-op with a shoe and cast. Will evaluate once healed.

## 2014-06-21 ENCOUNTER — Encounter (HOSPITAL_COMMUNITY): Payer: Self-pay | Admitting: *Deleted

## 2014-06-21 ENCOUNTER — Telehealth: Payer: Self-pay | Admitting: Primary Care

## 2014-06-21 ENCOUNTER — Emergency Department (HOSPITAL_COMMUNITY): Payer: 59

## 2014-06-21 ENCOUNTER — Emergency Department (HOSPITAL_COMMUNITY)
Admission: EM | Admit: 2014-06-21 | Discharge: 2014-06-21 | Disposition: A | Discharge status: Home / Self Care | Payer: 59 | Attending: Emergency Medicine | Admitting: Emergency Medicine

## 2014-06-21 DIAGNOSIS — I10 Essential (primary) hypertension: Secondary | ICD-10-CM | POA: Insufficient documentation

## 2014-06-21 DIAGNOSIS — D649 Anemia, unspecified: Secondary | ICD-10-CM | POA: Insufficient documentation

## 2014-06-21 DIAGNOSIS — Z794 Long term (current) use of insulin: Secondary | ICD-10-CM | POA: Insufficient documentation

## 2014-06-21 DIAGNOSIS — K5900 Constipation, unspecified: Secondary | ICD-10-CM | POA: Diagnosis not present

## 2014-06-21 DIAGNOSIS — Z79899 Other long term (current) drug therapy: Secondary | ICD-10-CM | POA: Insufficient documentation

## 2014-06-21 DIAGNOSIS — M549 Dorsalgia, unspecified: Secondary | ICD-10-CM | POA: Diagnosis not present

## 2014-06-21 DIAGNOSIS — E119 Type 2 diabetes mellitus without complications: Secondary | ICD-10-CM | POA: Insufficient documentation

## 2014-06-21 DIAGNOSIS — Z792 Long term (current) use of antibiotics: Secondary | ICD-10-CM | POA: Diagnosis not present

## 2014-06-21 LAB — COMPREHENSIVE METABOLIC PANEL
ALBUMIN: 3.7 g/dL (ref 3.5–5.2)
ALK PHOS: 107 U/L (ref 39–117)
ALT: 13 U/L (ref 0–35)
AST: 18 U/L (ref 0–37)
Anion gap: 15 (ref 5–15)
BILIRUBIN TOTAL: 0.9 mg/dL (ref 0.3–1.2)
BUN: 19 mg/dL (ref 6–23)
CHLORIDE: 95 mmol/L — AB (ref 96–112)
CO2: 22 mmol/L (ref 19–32)
Calcium: 9.6 mg/dL (ref 8.4–10.5)
Creatinine, Ser: 1.43 mg/dL — ABNORMAL HIGH (ref 0.50–1.10)
GFR calc non Af Amer: 38 mL/min — ABNORMAL LOW (ref >90)
GFR, EST AFRICAN AMERICAN: 44 mL/min — AB (ref >90)
GLUCOSE: 527 mg/dL — AB (ref 70–99)
POTASSIUM: 5.4 mmol/L — AB (ref 3.5–5.1)
SODIUM: 132 mmol/L — AB (ref 135–145)
Total Protein: 7.6 g/dL (ref 6.0–8.3)

## 2014-06-21 LAB — URINALYSIS, ROUTINE W REFLEX MICROSCOPIC
Bilirubin Urine: NEGATIVE
Glucose, UA: >1000 mg/dL — AB
KETONES UR: 15 mg/dL — AB
LEUKOCYTES UA: NEGATIVE
NITRITE: NEGATIVE
PROTEIN: 100 mg/dL — AB
Specific Gravity, Urine: 1.028 (ref 1.005–1.030)
Urobilinogen, UA: 0.2 mg/dL (ref 0.0–1.0)
pH: 5.5 (ref 5.0–8.0)

## 2014-06-21 LAB — CBC WITH DIFFERENTIAL/PLATELET
BASOS ABS: 0.1 10*3/uL (ref 0.0–0.1)
BASOS PCT: 1 % (ref 0–1)
EOS ABS: 0 10*3/uL (ref 0.0–0.7)
EOS PCT: 0 % (ref 0–5)
HCT: 34.3 % — ABNORMAL LOW (ref 36.0–46.0)
Hemoglobin: 11 g/dL — ABNORMAL LOW (ref 12.0–15.0)
Lymphocytes Relative: 26 % (ref 12–46)
Lymphs Abs: 1.5 10*3/uL (ref 0.7–4.0)
MCH: 25.8 pg — AB (ref 26.0–34.0)
MCHC: 32.1 g/dL (ref 30.0–36.0)
MCV: 80.3 fL (ref 78.0–100.0)
Monocytes Absolute: 0.4 10*3/uL (ref 0.1–1.0)
Monocytes Relative: 6 % (ref 3–12)
Neutro Abs: 3.9 10*3/uL (ref 1.7–7.7)
Neutrophils Relative %: 67 % (ref 43–77)
Platelets: 260 10*3/uL (ref 150–400)
RBC: 4.27 MIL/uL (ref 3.87–5.11)
RDW: 13.5 % (ref 11.5–15.5)
WBC: 5.8 10*3/uL (ref 4.0–10.5)

## 2014-06-21 LAB — URINE MICROSCOPIC-ADD ON

## 2014-06-21 LAB — POC OCCULT BLOOD, ED: Fecal Occult Bld: NEGATIVE

## 2014-06-21 MED ORDER — FENTANYL CITRATE (PF) 100 MCG/2ML IJ SOLN
50.0000 ug | Freq: Once | INTRAMUSCULAR | Status: AC
  Administered 2014-06-21: 50 ug via INTRAVENOUS
  Filled 2014-06-21: qty 2

## 2014-06-21 MED ORDER — SODIUM CHLORIDE 0.9 % IV BOLUS (SEPSIS)
500.0000 mL | Freq: Once | INTRAVENOUS | Status: AC
  Administered 2014-06-21: 500 mL via INTRAVENOUS

## 2014-06-21 MED ORDER — POLYETHYLENE GLYCOL 3350 17 G PO PACK: 17.0000 g | PACK | Freq: Every day | ORAL | Status: DC

## 2014-06-21 NOTE — ED Notes (Signed)
Pt ambulated to restroom after arrival to room, noted to have one large dark bowel movement

## 2014-06-21 NOTE — Telephone Encounter (Signed)
PLEASE NOTE: All timestamps contained within this report are represented as [place] Standard Time. CONFIDENTIALTY NOTICE: This fax transmission is intended only for the addressee. It contains information that is legally privileged, confidential or otherwise protected from use or disclosure. If you are not the intended recipient, you are strictly prohibited from reviewing, disclosing, copying using or disseminating any of this information or taking any action in reliance on or regarding this information. If you have received this fax in error, please notify us immediately by telephone so that we can arrange for its return to us. Phone: [phone], Toll-Free: [phone], Fax: [phone] Page: 1 of 2 Call Id: [id number] [facility] Client TELEPHONE ADVICE RECORD [facility] Patient Name: Rachel Gonzalez Gender: Female DOB: Jul 11, 1950 Age: [age] Y 7 M 14 D Return Phone Number: [phone] (Primary) Address: [address] [place]Zip: [place] [id number] Client [facility] Contact Type Call Call Type Triage / Clinical Relationship To Patient Self Appointment Disposition EMR Appointment Not Necessary Info pasted into Epic Yes Return Phone Number [phone] (Primary) Chief Complaint Constipation Initial Comment Caller states they are having constipation, last one was on Tuesday. PreDisposition Go to ED Nurse Assessment Nurse: Ladona RidgelGaddy, RN, Sunny SchleinFelicia Date/Time Lamount Cohen(Eastern Time): 06/21/2014 4:09:48 PM Confirm and document reason for call. If symptomatic, describe symptoms. ---Pt is constipated and her last BM was Tues. Has the patient traveled out of the country within the last 30 days? ---No Does the patient require triage? ---Yes Related visit to physician within the last 2 weeks? ---Yes Does the PT have any chronic conditions? (i.e. diabetes, asthma,  etc.) ---Yes List chronic conditions. ---DM Guidelines Guideline Title Affirmed Question Affirmed Notes Nurse Date/Time (Eastern Time) Abdominal Pain - Female [1] SEVERE pain (e.g., excruciating) AND [2] present > 1 hour [place]Gaddy, RN, [facility] 06/21/2014 4:14:49 PM Disp. Time Lamount Cohen(Eastern Time) Disposition Final User 06/21/2014 3:22:24 PM Attempt made - message left [place]addy, RN, [facility] 06/21/2014 3:47:30 PM Attempt made - message left Ladona RidgelGaddy, RN, [facility] 06/21/2014 4:32:39 PM Call Completed Ladona RidgelGaddy, RN, Sunny SchleinFelicia 06/21/2014 4:20:03 PM Go to ED Now Yes Ladona RidgelGaddy, RN, Lavena StanfordFelicia Caller Understands: Yes PLEASE NOTE: All timestamps contained within this report are represented as [place] Standard Time. CONFIDENTIALTY NOTICE: This fax transmission is intended only for the addressee. It contains information that is legally privileged, confidential or otherwise protected from use or disclosure. If you are not the intended recipient, you are strictly prohibited from reviewing, disclosing, copying using or disseminating any of this information or taking any action in reliance on or regarding this information. If you have received this fax in error, please notify us immediately by telephone so that we can arrange for its return to us. Phone: [phone], Toll-Free: [phone], Fax: [phone] Page: 2 of 2 Call Id: [id number] Disagree/Comply: Comply Care Advice Given Per Guideline GO TO ED NOW: You need to be seen in the Emergency Department. Go to the ER at ___________ Hospital. Leave now. Drive carefully. DRIVING: Another adult should drive. * It is also a good idea to bring the pill bottles too. This will help the doctor to make certain you are taking the right medicines and the right dose. NOTHING BY MOUTH: Do not eat or drink anything for now. (Reason: condition may need surgery and general anesthesia) After Care Instructions Given Call Event Type User Date / Time Description [facility]  [facility]

## 2014-06-21 NOTE — ED Notes (Signed)
Pt c/o constipation. Pt states she has not had a BM since Sunday. Last BM was hard. Pt reports taking narcotic medication but is not sure what kind. Pt reports abdominal pain with the constipation.

## 2014-06-21 NOTE — Discharge Instructions (Signed)

## 2014-06-21 NOTE — Telephone Encounter (Signed)
[facility] Client TELEPHONE ADVICE RECORD [facility] Patient Name: Rachel SellerCAROL Gonzalez DOB: [birth year] Initial Comment Caller states they are having constipation, last one was on Tuesday. Nurse Assessment Nurse: Ladona RidgelGaddy, RN, Felicia Date/Time (Eastern Time): 06/21/2014 4:09:48 PM Confirm and document reason for call. If symptomatic, describe symptoms. ---Pt is constipated and her last BM was Tues. Has the patient traveled out of the country within the last 30 days? ---No Does the patient require triage? ---Yes Related visit to physician within the last 2 weeks? ---Yes Does the PT have any chronic conditions? (i.e. diabetes, asthma, etc.)---Yes List chronic conditions. ---DM Guidelines Guideline Title Affirmed Question Affirmed Notes Abdominal Pain - Female [1] SEVERE pain (e.g., excruciating) AND [2] present > 1 hour Final Disposition User Go to ED Now Ladona RidgelGaddy, RN, Felicia Call Id: [phone]

## 2014-06-21 NOTE — ED Notes (Signed)
Right foot wound cleansed with saf-clens, dressed with oil emulsion dressing, telfa, gauze, and ace wrap.  Pt tolerated well

## 2014-06-21 NOTE — ED Provider Notes (Signed)
CSN: [id number]     Arrival date & time 06/21/14  1655 History   First MD Initiated Contact with Patient 06/21/14 1746     Chief Complaint  Patient presents with  . Constipation     (Consider location/radiation/quality/duration/timing/severity/associated sxs/prior Treatment) Patient is a 64 y.o. female presenting with constipation. The history is provided by the patient.  Constipation Severity:  Mild Time since last bowel movement: 2-3. Timing:  Constant Progression:  Unchanged Chronicity:  New Context: medication   Stool description:  Hard Relieved by:  Nothing Worsened by:  Nothing tried Ineffective treatments: docusate pills daily. Associated symptoms: back pain   Associated symptoms: no abdominal pain, no diarrhea, no dysuria, no fever, no nausea and no vomiting     Past Medical History  Diagnosis Date  . Diabetes mellitus   . Hypertension   . Anemia    Past Surgical History  Procedure Laterality Date  . Arm surgery     . Cesarean section    . I&d extremity Right 05/08/2014    Procedure: IRRIGATION AND DEBRIDEMENT FOOT;  Surgeon: Nadara Mustard, MD;  Location: [facility] OR;  Service: Orthopedics;  Laterality: Right;  . Amputation Right 05/08/2014    Procedure: AMPUTATION RAY, right great toe;  Surgeon: Nadara Mustard, MD;  Location: [facility] OR;  Service: Orthopedics;  Laterality: Right;   Family History  Problem Relation Age of Onset  . Diabetes Mother    History  Substance Use Topics  . Smoking status: Never Smoker   . Smokeless tobacco: Not on file  . Alcohol Use: No   OB History    No data available     Review of Systems  Constitutional: Negative for fever and fatigue.  HENT: Negative for congestion and drooling.   Eyes: Negative for pain.  Respiratory: Negative for cough and shortness of breath.   Cardiovascular: Negative for chest pain.  Gastrointestinal: Positive for constipation. Negative for nausea, vomiting, abdominal pain and diarrhea.  Genitourinary:  Negative for dysuria and hematuria.  Musculoskeletal: Positive for back pain. Negative for gait problem and neck pain.  Skin: Negative for color change.  Neurological: Negative for dizziness and headaches.  Hematological: Negative for adenopathy.  Psychiatric/Behavioral: Negative for behavioral problems.  All other systems reviewed and are negative.     Allergies  Tramadol  Home Medications   Prior to Admission medications   Medication Sig Start Date End Date Taking? Authorizing Provider  Amino Acids-Protein Hydrolys (FEEDING SUPPLEMENT, PRO-STAT SUGAR FREE 64,) LIQD Take 30 mLs by mouth 2 (two) times daily. 05/16/14   Leroy Sea, MD  cephALEXin (KEFLEX) 500 MG capsule 1 cap po bid x 7 days 06/03/14   Mercedes Camprubi-Soms, PA-C  docusate sodium (COLACE) 100 MG capsule Take 1 capsule (100 mg total) by mouth 2 (two) times daily. 05/16/14   Leroy Sea, MD  doxycycline (VIBRAMYCIN) 100 MG capsule Take 100 mg by mouth 2 (two) times daily.    Historical Provider, MD  ferrous sulfate 325 (65 FE) MG tablet Take 1 tablet (325 mg total) by mouth daily with breakfast. 05/16/14   Leroy Sea, MD  hydrALAZINE (APRESOLINE) 25 MG tablet Take 37.5 mg by mouth 2 (two) times daily.    Historical Provider, MD  insulin aspart (NOVOLOG) 100 UNIT/ML injection Before each meal 3 times a day, 140-199 - 2 units, 200-250 - 4 units, 251-299 - 6 units,  300-349 - 8 units,  350 or above 10 units. Insulin PEN if approved, provide syringes  and needles if needed. 05/16/14   Leroy SeaPrashant K Singh, MD  insulin detemir (LEVEMIR) 100 UNIT/ML injection Inject 0.28 mLs (28 Units total) into the skin at bedtime. 05/16/14   Leroy SeaPrashant K Singh, MD  isosorbide dinitrate (ISORDIL) 30 MG tablet Take 30 mg by mouth 2 (two) times daily.    Historical Provider, MD  isosorbide-hydrALAZINE (BIDIL) 20-37.5 MG per tablet Take 1 tablet by mouth 2 (two) times daily. Patient not taking: Reported on 06/06/2014 05/16/14   Leroy SeaPrashant K Singh, MD   metoprolol tartrate (LOPRESSOR) 25 MG tablet Take 0.5 tablets (12.5 mg total) by mouth 2 (two) times daily. 05/16/14   Leroy SeaPrashant K Singh, MD  ondansetron (ZOFRAN ODT) 4 MG disintegrating tablet Take 1 tablet (4 mg total) by mouth every 8 (eight) hours as needed for nausea or vomiting. 06/03/14   Mercedes Camprubi-Soms, PA-C  oxyCODONE-acetaminophen (PERCOCET/ROXICET) 5-325 MG per tablet Take 1 tablet by mouth every 8 (eight) hours as needed for severe pain. Patient taking differently: Take 1 tablet by mouth every 8 (eight) hours as needed for moderate pain or severe pain.  05/16/14   Leroy SeaPrashant K Singh, MD  pantoprazole (PROTONIX) 40 MG tablet Take 1 tablet (40 mg total) by mouth daily at 12 noon. 05/16/14   Leroy SeaPrashant K Singh, MD  penicillin v potassium (VEETID) 500 MG tablet Take 500 mg by mouth 3 (three) times daily.    Historical Provider, MD  pyridOXINE (VITAMIN B-6) 25 MG tablet Take 25 mg by mouth 3 (three) times daily.    Historical Provider, MD  sulfamethoxazole-trimethoprim (BACTRIM DS,SEPTRA DS) 800-160 MG per tablet Take 1 tablet by mouth 2 (two) times daily. 06/03/14   Mercedes Camprubi-Soms, PA-C   BP 172/94 mmHg  Pulse 98  Temp(Src) 98.5 F (36.9 C) (Oral)  Resp 20  SpO2 99% Physical Exam  Constitutional: She is oriented to person, place, and time. She appears well-developed and well-nourished.  HENT:  Head: Normocephalic.  Mouth/Throat: Oropharynx is clear and moist. No oropharyngeal exudate.  Eyes: Conjunctivae and EOM are normal. Pupils are equal, round, and reactive to light.  Neck: Normal range of motion. Neck supple.  Cardiovascular: Normal rate, regular rhythm, normal heart sounds and intact distal pulses.  Exam reveals no gallop and no friction rub.   No murmur heard. Pulmonary/Chest: Effort normal and breath sounds normal. No respiratory distress. She has no wheezes.  Abdominal: Soft. Bowel sounds are normal. There is no tenderness. There is no rebound and no guarding.   Genitourinary:  Normal external rectum. Otherwise normal rectal exam. Dark appearing stool. No fecal impaction noted.  Musculoskeletal: Normal range of motion. She exhibits no edema or tenderness.  No focal tenderness of the back.  Neurological: She is alert and oriented to person, place, and time.  Skin: Skin is warm and dry.  Psychiatric: She has a normal mood and affect. Her behavior is normal.  Nursing note and vitals reviewed.   ED Course  Procedures (including critical care time) Labs Review Labs Reviewed  CBC WITH DIFFERENTIAL/PLATELET - Abnormal; Notable for the following:    Hemoglobin 11.0 (*)    HCT 34.3 (*)    MCH 25.8 (*)    All other components within normal limits  COMPREHENSIVE METABOLIC PANEL - Abnormal; Notable for the following:    Sodium 132 (*)    Potassium 5.4 (*)    Chloride 95 (*)    Glucose, Bld 527 (*)    Creatinine, Ser 1.43 (*)    GFR calc non Af [place]  38 (*)    GFR calc Af Amer 44 (*)    All other components within normal limits  URINALYSIS, ROUTINE W REFLEX MICROSCOPIC - Abnormal; Notable for the following:    Glucose, UA >1000 (*)    Hgb urine dipstick SMALL (*)    Ketones, ur 15 (*)    Protein, ur 100 (*)    All other components within normal limits  URINE MICROSCOPIC-ADD ON  OCCULT BLOOD X 1 CARD TO LAB, STOOL  POC OCCULT BLOOD, ED    Imaging Review Dg Abd 2 Views  06/21/2014   CLINICAL DATA:  Nausea/vomiting/2 weeks, constipation x2 months  EXAM: ABDOMEN - 2 VIEW  COMPARISON:  05/09/2014  FINDINGS: Nonobstructive bowel gas pattern.  No evidence of free air on the lateral decubitus view.  Mild to moderate colonic stool burden.  IMPRESSION: No evidence of small bowel obstruction or free air.   Electronically Signed   By: Charline Bills M.D.   On: 06/21/2014 20:56     EKG Interpretation None      MDM   Final diagnoses:  Constipation    7:12 PM 64 y.o. female w hx of DM, HTN, anemia on Iron, s/p amputation of right great toe on  05/08/2014 who presents with constipation for the last few weeks. She notes she had a large dark hard bowel movement several days ago. She is not had one since then. She just had a large bowel movement prior to my evaluation here in the ED. She complains of some rectal pain and nonspecific low back pain when bearing down to have a bm but otherwise denies any abdominal pain. She denies seeing any frank blood in her stool. She denies any fevers. She has had some occasional nausea and vomiting. She is afebrile and vital signs are unremarkable here. She is a normal rectal exam.  The patient has been on narcotics recently and has been taking a docusate pill daily without significant relief.  9:35 PM: I interpreted/reviewed the labs and/or imaging which were non-contributory.   I have discussed the diagnosis/risks/treatment options with the patient and family and believe the pt to be eligible for discharge home to follow-up with her pcp. Will recommend miralax. We also discussed returning to the ED immediately if new or worsening sx occur. We discussed the sx which are most concerning (e.g., worsening pain, vomiting, bloody stools, fever) that necessitate immediate return. Medications administered to the patient during their visit and any new prescriptions provided to the patient are listed below.  Medications given during this visit Medications  fentaNYL (SUBLIMAZE) injection 50 mcg (50 mcg Intravenous Given 06/21/14 1840)  sodium chloride 0.9 % bolus 500 mL (500 mLs Intravenous New Bag/Given 06/21/14 1938)  fentaNYL (SUBLIMAZE) injection 50 mcg (50 mcg Intravenous Given 06/21/14 2127)    New Prescriptions   POLYETHYLENE GLYCOL (MIRALAX / GLYCOLAX) PACKET    Take 17 g by mouth daily.     Purvis Sheffield, MD 06/22/14 [phone]

## 2014-06-26 ENCOUNTER — Ambulatory Visit (INDEPENDENT_AMBULATORY_CARE_PROVIDER_SITE_OTHER): Payer: 59 | Admitting: Primary Care

## 2014-06-26 ENCOUNTER — Encounter: Payer: Self-pay | Admitting: Primary Care

## 2014-06-26 VITALS — BP 138/64 | HR 91 | Temp 98.4°F | Ht 62.0 in | Wt 183.8 lb

## 2014-06-26 DIAGNOSIS — E1165 Type 2 diabetes mellitus with hyperglycemia: Secondary | ICD-10-CM

## 2014-06-26 DIAGNOSIS — IMO0002 Reserved for concepts with insufficient information to code with codable children: Secondary | ICD-10-CM

## 2014-06-26 DIAGNOSIS — I10 Essential (primary) hypertension: Secondary | ICD-10-CM

## 2014-06-26 MED ORDER — HYDROCHLOROTHIAZIDE 25 MG PO TABS: 25.0000 mg | ORAL_TABLET | Freq: Every day | ORAL | Status: DC

## 2014-06-26 MED ORDER — LISINOPRIL 10 MG PO TABS: 10.0000 mg | ORAL_TABLET | Freq: Every day | ORAL | Status: DC

## 2014-06-26 NOTE — Patient Instructions (Signed)
Start taking Lisinopril 10mg  tablets daily for blood pressure. Start taking Hydrochlorothiazide 25mg  tablets daily for blood pressure. Do not take the isosorbide dinitrate. Do not take the metoprolol. Do not take the hydralazine.  Increase your Novolog to 9 units with meals. Please check your blood sugar three times daily before your meals. Follow up in 2 weeks for re-evaluation.

## 2014-06-26 NOTE — Assessment & Plan Note (Addendum)
BP stable today, she is out of all BP medications. She is currently taking hydralazine, isosorbide dinitrate, and metoprolol tartate. Echo in hospital in March was normal and with an EF of 65%. She denies palpitations, chest pain, shortness of breath. Creatinine on 06/21/14 was 1.46 which has improved overall. Discontinued all current blood pressure medications. Started Lisinopril 10 mg for renal protection and HCTZ 25mg  to help with lower extremity edema. Education provided on adverse reactions to lisinopril, she verbalized understanding. Follow up in 2 weeks for re-evaluation.  BP: 138/64 mmHg

## 2014-06-26 NOTE — Progress Notes (Signed)
Subjective:    Patient ID: Rachel Gonzalez, female    DOB: Apr 23, 1950, 64 y.o.   MRN: [id number]  HPI  Rachel Gonzalez is a 64 year old female who presents today for follow up of hypertension and diabetes.  1) DM 2: She's currently checking her sugars every morning, and does not check them with meals. Her morning sugars are running in the mid to high 200's. She is taking 28 units of Levemir at bedtime and is 6 units of Novolog with meals. She has not had any low readings on her glucometer and denies changes in vision, weakness, dizziness, or other symptoms of hypoglycemia. She reports occasional numbness/tingling to left foot and fingers. Foot exam completed 2 weeks ago and had decreased sensation to left foot.  Right foot is still covered with a dressing and is recovering from surgery. She has an appointment next Tuesday with Dr. [name]. Last eye exam was over 2 years ago. She is working on her diet which currently consists of cereal, glucerna shakes, [place] bacon, eggs, gritts, greens, baked chicken. She drinks mostly water and is not consuming desserts.  2) Hypertension: She was placed on several medications in the hospital in early March. She has been taking Hydralizine, isosorbide dinitrate, and metoprolol without complaints, and she is currently out of all medications. She had an Echocardiogram completed in the hospital which resulted as normal with an EF of 65%. She does not have a blood pressure cuff, but currently has a home health nurse coming to her house weekly who will check her blood pressure. She reports her readings are "good". Denies chest pain, shortness of breath, dizziness.   BP Readings from Last 3 Encounters:  06/26/14 138/64  06/21/14 183/92  06/06/14 142/68    Wt Readings from Last 3 Encounters:  06/26/14 183 lb 12.8 oz (83.371 kg)  06/06/14 185 lb (83.915 kg)  06/03/14 188 lb (85.276 kg)     Review of Systems  Respiratory: Negative for cough and shortness of breath.     Cardiovascular: Positive for leg swelling. Negative for chest pain and palpitations.       Occasional leg swelling a few times weekly.  Gastrointestinal:       Occasional constipation  Endocrine: Negative for polydipsia, polyphagia and polyuria.  Neurological: Negative for dizziness, light-headedness and headaches.       Past Medical History  Diagnosis Date  . Diabetes mellitus   . Hypertension   . Anemia     History   Social History  . Marital Status: Married    Spouse Name: N/A  . Number of Children: N/A  . Years of Education: N/A   Occupational History  . Not on file.   Social History Main Topics  . Smoking status: Never Smoker   . Smokeless tobacco: Not on file  . Alcohol Use: No  . Drug Use: No  . Sexual Activity: Not on file   Other Topics Concern  . Not on file   Social History Narrative   From [place].   Married.   Three children, 9 grandchildren.   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   Travels to [place], [place]       Past Surgical History  Procedure Laterality Date  . Arm surgery     . Cesarean section    . I&d extremity Right 05/08/2014    Procedure: IRRIGATION AND DEBRIDEMENT FOOT;  Surgeon: Nadara Mustard, MD;  Location:  [facility] OR;  Service: Orthopedics;  Laterality: Right;  . Amputation Right 05/08/2014    Procedure: AMPUTATION RAY, right great toe;  Surgeon: Nadara MustardMarcus Duda V, MD;  Location: [facility] OR;  Service: Orthopedics;  Laterality: Right;    Family History  Problem Relation Age of Onset  . Diabetes Mother     Allergies  Allergen Reactions  . Tramadol Nausea And Vomiting    Current Outpatient Prescriptions on File Prior to Visit  Medication Sig Dispense Refill  . Amino Acids-Protein Hydrolys (FEEDING SUPPLEMENT, PRO-STAT SUGAR FREE 64,) LIQD Take 30 mLs by mouth 2 (two) times daily. 900 mL 0  . cephALEXin (KEFLEX) 500 MG capsule 1 cap po bid x 7 days 14 capsule 0  . docusate sodium  (COLACE) 100 MG capsule Take 1 capsule (100 mg total) by mouth 2 (two) times daily. 10 capsule 0  . doxycycline (VIBRAMYCIN) 100 MG capsule Take 100 mg by mouth 2 (two) times daily.    . ferrous sulfate 325 (65 FE) MG tablet Take 1 tablet (325 mg total) by mouth daily with breakfast.  3  . insulin aspart (NOVOLOG) 100 UNIT/ML injection Before each meal 3 times a day, 140-199 - 2 units, 200-250 - 4 units, 251-299 - 6 units,  300-349 - 8 units,  350 or above 10 units. Insulin PEN if approved, provide syringes and needles if needed. 10 mL 11  . insulin detemir (LEVEMIR) 100 UNIT/ML injection Inject 0.28 mLs (28 Units total) into the skin at bedtime. 10 mL 11  . ondansetron (ZOFRAN ODT) 4 MG disintegrating tablet Take 1 tablet (4 mg total) by mouth every 8 (eight) hours as needed for nausea or vomiting. 15 tablet 0  . oxyCODONE-acetaminophen (PERCOCET/ROXICET) 5-325 MG per tablet Take 1 tablet by mouth every 8 (eight) hours as needed for severe pain. (Patient taking differently: Take 1 tablet by mouth every 8 (eight) hours as needed for moderate pain or severe pain. ) 15 tablet 0  . pantoprazole (PROTONIX) 40 MG tablet Take 1 tablet (40 mg total) by mouth daily at 12 noon.    . polyethylene glycol (MIRALAX / GLYCOLAX) packet Take 17 g by mouth daily. 14 each 0  . sulfamethoxazole-trimethoprim (BACTRIM DS,SEPTRA DS) 800-160 MG per tablet Take 1 tablet by mouth 2 (two) times daily. 14 tablet 0   No current facility-administered medications on file prior to visit.    BP 138/64 mmHg  Pulse 91  Temp(Src) 98.4 F (36.9 C) (Oral)  Ht 5\' 2"  (1.575 m)  Wt 183 lb 12.8 oz (83.371 kg)  BMI 33.61 kg/m2  SpO2 98%    Objective:   Physical Exam  Constitutional: She is oriented to person, place, and time. She appears well-developed.  Neck: Neck supple.  Cardiovascular: Normal rate and regular rhythm.   Pulmonary/Chest: Effort normal and breath sounds normal.  Slight edema noted to left ankle. No pitting.   Lymphadenopathy:    She has no cervical adenopathy.  Neurological: She is alert and oriented to person, place, and time.  Skin: Skin is warm and dry.  Psychiatric: She has a normal mood and affect.          Assessment & Plan:  >25 minutes spent face to face with patient, >50% spent counseling or coordinating care.

## 2014-06-26 NOTE — Assessment & Plan Note (Signed)
Uncontrolled with slight improvement. Continue Levemir at 28 units, increase Novolog to 9 units with meals. A lot of time was spent discussing proper diet which included to limit carbs and sugars such as her cereal and grits. She was instructed to check her sugars three times daily before meals and to hold Novolog if sugars were less than 110. Education provided on symptoms of hypoglycemia and to hold insulin if sugars are less than 110. Referral made for eye exam. Follow up in 2 weeks for re-evaluation.

## 2014-06-26 NOTE — Progress Notes (Signed)
Pre visit review using our clinic review tool, if applicable. No additional management support is needed unless otherwise documented below in the visit note. 

## 2014-06-28 ENCOUNTER — Telehealth: Payer: Self-pay | Admitting: Primary Care

## 2014-07-09 ENCOUNTER — Ambulatory Visit (INDEPENDENT_AMBULATORY_CARE_PROVIDER_SITE_OTHER): Payer: 59 | Admitting: Primary Care

## 2014-07-09 ENCOUNTER — Telehealth: Payer: Self-pay | Admitting: Radiology

## 2014-07-09 ENCOUNTER — Encounter: Payer: Self-pay | Admitting: Primary Care

## 2014-07-09 VITALS — BP 100/54 | HR 80 | Temp 97.6°F | Ht 62.0 in | Wt 178.0 lb

## 2014-07-09 DIAGNOSIS — L298 Other pruritus: Secondary | ICD-10-CM | POA: Diagnosis not present

## 2014-07-09 DIAGNOSIS — N898 Other specified noninflammatory disorders of vagina: Secondary | ICD-10-CM

## 2014-07-09 DIAGNOSIS — E1165 Type 2 diabetes mellitus with hyperglycemia: Secondary | ICD-10-CM

## 2014-07-09 DIAGNOSIS — I10 Essential (primary) hypertension: Secondary | ICD-10-CM | POA: Diagnosis not present

## 2014-07-09 DIAGNOSIS — IMO0002 Reserved for concepts with insufficient information to code with codable children: Secondary | ICD-10-CM

## 2014-07-09 LAB — BASIC METABOLIC PANEL
BUN: 36 mg/dL — AB (ref 6–23)
CO2: 31 meq/L (ref 19–32)
Calcium: 10 mg/dL (ref 8.4–10.5)
Chloride: 89 mEq/L — ABNORMAL LOW (ref 96–112)
Creatinine, Ser: 1.74 mg/dL — ABNORMAL HIGH (ref 0.40–1.20)
GFR: 38.05 mL/min — AB (ref >60.00)
GLUCOSE: 555 mg/dL — AB (ref 70–99)
POTASSIUM: 5.6 meq/L — AB (ref 3.5–5.1)
Sodium: 126 mEq/L — ABNORMAL LOW (ref 135–145)

## 2014-07-09 MED ORDER — INSULIN ASPART 100 UNIT/ML FLEXPEN: PEN_INJECTOR | SUBCUTANEOUS | Status: DC

## 2014-07-09 MED ORDER — LISINOPRIL 5 MG PO TABS: 5.0000 mg | ORAL_TABLET | Freq: Every day | ORAL | Status: DC

## 2014-07-09 MED ORDER — FLUCONAZOLE 150 MG PO TABS: 150.0000 mg | ORAL_TABLET | Freq: Once | ORAL | Status: DC

## 2014-07-09 NOTE — Telephone Encounter (Signed)
[facility] called a critical Glucose, - 560. Results given to Mayra ReelKate Clark, NP

## 2014-07-09 NOTE — Progress Notes (Signed)
Pre visit review using our clinic review tool, if applicable. No additional management support is needed unless otherwise documented below in the visit note. 

## 2014-07-09 NOTE — Progress Notes (Signed)
Subjective:    Patient ID: Rachel Gonzalez, female    DOB: Feb 26, 1951, 64 y.o.   MRN: [id number]  HPI  [name] is a 64 year old female who presents today for follow up of hypertension and diabetes.  1) Hypertension: She was started on Lisinopril 10 mg and HCTZ 25 mg last visit. Her home health nurse has been checking her BP at home for which it was noted to be lower Tuesday, Friday, and today. She denies headaches, chest pain, dizziness, weakness. Reports a reduction in ankle edema which was bothersome since her discharge from the hospital.   2) Diabetes type 2: She's checking her sugars once-twice daily and has been getting 180's to 200's on average. She has been out of her medication for 2 days, but administering 28 units of Levemir at night and 9 units of Novolog three times daily with meals. She denies any low numbers below 110 and any elevated numbers above 300. Her eye exam is scheduled for tomorrow (May 3rd). She is due to see her podiaterist May 16th.   3) Vaginal itching: Started Sunday. Itches externally to labia majora, denies discharge, foul odor, dysuria. She tried monistat OTC without a reduction in her itching.   Review of Systems  Respiratory: Negative for shortness of breath.   Cardiovascular: Negative for chest pain, palpitations and leg swelling.  Genitourinary: Negative for dysuria, vaginal discharge and pelvic pain.       Vaginal itching to labia majora  Neurological: Negative for dizziness and headaches.       Past Medical History  Diagnosis Date  . Diabetes mellitus   . Hypertension   . Anemia     History   Social History  . Marital Status: Married    Spouse Name: N/A  . Number of Children: N/A  . Years of Education: N/A   Occupational History  . Not on file.   Social History Main Topics  . Smoking status: Never Smoker   . Smokeless tobacco: Not on file  . Alcohol Use: No  . Drug Use: No  . Sexual Activity: Not on file   Other Topics  Concern  . Not on file   Social History Narrative   [place].   Married.   Three children, 9 grandchildren.   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   Travels to [place], [place]       Past Surgical History  Procedure Laterality Date  . Arm surgery     . Cesarean section    . I&d extremity Right 05/08/2014    Procedure: IRRIGATION AND DEBRIDEMENT FOOT;  Surgeon: Nadara MustardMarcus Duda V, MD;  Location: [facility];  Service: Orthopedics;  Laterality: Right;  . Amputation Right 05/08/2014    Procedure: AMPUTATION RAY, right great toe;  Surgeon: Nadara MustardMarcus Duda V, MD;  Location: [facility];  Service: Orthopedics;  Laterality: Right;    Family History  Problem Relation Age of Onset  . Diabetes Mother     Allergies  Allergen Reactions  . Tramadol Nausea And Vomiting    Current Outpatient Prescriptions on File Prior to Visit  Medication Sig Dispense Refill  . Amino Acids-Protein Hydrolys (FEEDING SUPPLEMENT, PRO-STAT SUGAR FREE 64,) LIQD Take 30 mLs by mouth 2 (two) times daily. 900 mL 0  . cephALEXin (KEFLEX) 500 MG capsule 1 cap po bid x 7 days 14 capsule 0  . docusate sodium (COLACE) 100 MG capsule Take 1 capsule (100  mg total) by mouth 2 (two) times daily. 10 capsule 0  . doxycycline (VIBRAMYCIN) 100 MG capsule Take 100 mg by mouth 2 (two) times daily.    . ferrous sulfate 325 (65 FE) MG tablet Take 1 tablet (325 mg total) by mouth daily with breakfast.  3  . hydrochlorothiazide (HYDRODIURIL) 25 MG tablet Take 1 tablet (25 mg total) by mouth daily. 30 tablet 3  . insulin detemir (LEVEMIR) 100 UNIT/ML injection Inject 0.28 mLs (28 Units total) into the skin at bedtime. 10 mL 11  . ondansetron (ZOFRAN ODT) 4 MG disintegrating tablet Take 1 tablet (4 mg total) by mouth every 8 (eight) hours as needed for nausea or vomiting. 15 tablet 0  . oxyCODONE-acetaminophen (PERCOCET/ROXICET) 5-325 MG per tablet Take 1 tablet by mouth every 8 (eight) hours as needed  for severe pain. (Patient taking differently: Take 1 tablet by mouth every 8 (eight) hours as needed for moderate pain or severe pain. ) 15 tablet 0  . pantoprazole (PROTONIX) 40 MG tablet Take 1 tablet (40 mg total) by mouth daily at 12 noon.    . polyethylene glycol (MIRALAX / GLYCOLAX) packet Take 17 g by mouth daily. 14 each 0  . sulfamethoxazole-trimethoprim (BACTRIM DS,SEPTRA DS) 800-160 MG per tablet Take 1 tablet by mouth 2 (two) times daily. 14 tablet 0   No current facility-administered medications on file prior to visit.    BP 100/54 mmHg  Pulse 80  Temp(Src) 97.6 F (36.4 C) (Oral)  Ht  (1.575 m)  Wt 178 lb (80.74 kg)  BMI 32.55 kg/m2  SpO2 97%    Objective:   Physical Exam  Constitutional: She is oriented to person, place, and time. She appears well-developed.  Cardiovascular: Normal rate and regular rhythm.   Pulmonary/Chest: Effort normal and breath sounds normal.  Neurological: She is alert and oriented to person, place, and time.  Skin: Skin is warm and dry.  Psychiatric: She has a normal mood and affect.          Assessment & Plan:

## 2014-07-09 NOTE — Assessment & Plan Note (Addendum)
Switched to Lisinopril 10 mg and HCTZ 25 mg last visit. Reports low readings of 100/60 over last week. Reduce Lisinopril to 5 mg daily, continue HCTZ 25 mg as it has helped reduce her lower extremity edema. Denies chest pain, SOB, headaches, dizziness. She is to call me if her BP falls at or below 100/60. She verbalized understanding. BMP today for renal evaluation. Follow up in one month for re-evaluation.

## 2014-07-09 NOTE — Assessment & Plan Note (Signed)
Improving. She's checking her sugars twice daily which are running between 180-200's and has been out of her meds for the past 2 days. Continue Levemir 28 units and Novolog 9 units TID with meals.  A1C next visit in one month. Will re-evaluate then.

## 2014-07-09 NOTE — Patient Instructions (Signed)
Cut your Lisinopril tablets in half. You will start taking 5mg  daily rather than 10mg . I will send Lisinopril 5mg  tablets to your pharmacy.  Refills have been sent on your insulin. Continue taking 28 units of Levemir at night and 9 units of Novolog three times daily with meals. Continue checking your sugars twice daily and write down your numbers. Bring them with you to your next appointment. Hold your insulin if your sugars are below 110. Call me if your blood pressure gets below 100/60. Follow up in one month for re-evaluation of your blood pressure and diabetes.

## 2014-07-10 ENCOUNTER — Other Ambulatory Visit (HOSPITAL_COMMUNITY): Payer: Self-pay | Admitting: Ophthalmology

## 2014-07-10 ENCOUNTER — Other Ambulatory Visit: Payer: 59

## 2014-07-10 DIAGNOSIS — H3410 Central retinal artery occlusion, unspecified eye: Secondary | ICD-10-CM

## 2014-07-10 LAB — HM DIABETES EYE EXAM

## 2014-07-10 NOTE — Telephone Encounter (Signed)
Called and spoken to patient. Notified patient that Rachel Gonzalez wanted patient to come in this Thursday or Friday for a repeat BMP. Patient wanted to come back and get her BMP done today after her eye exam today 07/10/14. The connection was not great but was able to schedule lab appt for later today.

## 2014-07-10 NOTE — Telephone Encounter (Signed)
Spoken to Jae DireKate and need to schedule the recheck on Thursday or Friday. It is too soon to get it done today. Left a voicemail for patient to call back.

## 2014-07-10 NOTE — Telephone Encounter (Signed)
Spoke with Rachel Gonzalez this morning. Her blood sugar last night before bed decreased to 252. She confirms she was out of her insulin for the past 2 days and did not notify our office. Several attempts were made 07/09/14 to discuss her lab result, including a voicemail, but patient did not return her call. She feels well this morning and has not yet checked her sugar. She will call later to schedule a repeat BMP for Thursday or Friday.  Johny DrillingChan, will you ensure that she schedules a lab appointment for either this Thursday or Friday? Thanks.

## 2014-07-11 ENCOUNTER — Other Ambulatory Visit: Payer: Self-pay | Admitting: Primary Care

## 2014-07-11 ENCOUNTER — Telehealth: Payer: Self-pay | Admitting: Primary Care

## 2014-07-11 ENCOUNTER — Other Ambulatory Visit: Payer: Self-pay | Admitting: *Deleted

## 2014-07-11 DIAGNOSIS — IMO0002 Reserved for concepts with insufficient information to code with codable children: Secondary | ICD-10-CM

## 2014-07-11 DIAGNOSIS — E1165 Type 2 diabetes mellitus with hyperglycemia: Secondary | ICD-10-CM

## 2014-07-11 MED ORDER — INSULIN LISPRO 100 UNIT/ML (KWIKPEN): PEN_INJECTOR | SUBCUTANEOUS | Status: DC

## 2014-07-11 NOTE — Telephone Encounter (Signed)
See phone note on 07/09/14.

## 2014-07-11 NOTE — Telephone Encounter (Signed)
Called and spoken to patient. Ask patient to schedule lab apt for Thursday or Friday. Patient stated that she is not sure because her husband would have to drive her to the office. Patient stated she may be able to make it on Thursday but not sure yet. Offer to call patient in the morning and set up lab apt for Thursday. Patient cannot make it on Friday. Thursday would be the only day she could. Going to call patient on Thursday.

## 2014-07-11 NOTE — Telephone Encounter (Signed)
Left voicemail for patient to call back for lab apt.

## 2014-07-11 NOTE — Telephone Encounter (Signed)
Will you please follow up with [name] to ensure she plans on coming for lab tomorrow or Friday?  Thank you!

## 2014-07-11 NOTE — Telephone Encounter (Signed)
Novolog is not covered on patient's insurance.  Per K.Clark, NP, Rx changed to Humalog.  Left message for Vernona RiegerLaura at [facility] to return my call.

## 2014-07-11 NOTE — Telephone Encounter (Signed)
Please call Vernona RiegerLaura at [facility] - [phone] about patient's insulin.

## 2014-07-11 NOTE — Telephone Encounter (Signed)
Patient instructed by Almira CoasterGina, RN to obtain her Humalog which was ready at the pharmacy. Will call patient in the morning to ensure blood sugars are normalizing. Will repeat BMP as soon as possible.

## 2014-07-11 NOTE — Telephone Encounter (Signed)
Left a voicemail for a call back to set up lab apt.

## 2014-07-11 NOTE — Progress Notes (Signed)
Insulin Rx changed to Humalog which is covered by the patient's insurance.  Phoned in per [facility]Clark, NP.

## 2014-07-12 ENCOUNTER — Telehealth: Payer: Self-pay | Admitting: Primary Care

## 2014-07-12 NOTE — Telephone Encounter (Signed)
Attempted to call patient to check up blood sugars, no answer, and mail box full. Will try again tomorrow.

## 2014-07-12 NOTE — Telephone Encounter (Signed)
Spoke to patient 07/11/14 at 1630 to notify new insulin Rx is at pharmacy for pick up.

## 2014-07-12 NOTE — Telephone Encounter (Signed)
Called and spoken to patient. Rachel Gonzalez have asked to schedule a follow up for patient on Monday 07/16/14. Was able to schedule the apt on 07/16/14 for 8 am.

## 2014-07-13 ENCOUNTER — Other Ambulatory Visit: Payer: Self-pay | Admitting: Adult Health

## 2014-07-13 NOTE — Telephone Encounter (Signed)
Spoke with patient moments ago. Her last blood sugar was 115. She was also evaluated by her home health nurse today, patient reports blood pressure was normal. Will follow up with patient in the office on Monday for repeat BMP.

## 2014-07-16 ENCOUNTER — Ambulatory Visit (INDEPENDENT_AMBULATORY_CARE_PROVIDER_SITE_OTHER): Payer: 59 | Admitting: Primary Care

## 2014-07-16 ENCOUNTER — Ambulatory Visit (HOSPITAL_COMMUNITY): Payer: 59 | Attending: Primary Care

## 2014-07-16 ENCOUNTER — Other Ambulatory Visit: Payer: Self-pay | Admitting: Adult Health

## 2014-07-16 ENCOUNTER — Encounter: Payer: Self-pay | Admitting: Primary Care

## 2014-07-16 VITALS — BP 126/76 | HR 80 | Temp 98.1°F | Ht 62.0 in | Wt 176.8 lb

## 2014-07-16 DIAGNOSIS — I10 Essential (primary) hypertension: Secondary | ICD-10-CM

## 2014-07-16 DIAGNOSIS — E1165 Type 2 diabetes mellitus with hyperglycemia: Secondary | ICD-10-CM

## 2014-07-16 DIAGNOSIS — IMO0002 Reserved for concepts with insufficient information to code with codable children: Secondary | ICD-10-CM

## 2014-07-16 LAB — BASIC METABOLIC PANEL
BUN: 36 mg/dL — ABNORMAL HIGH (ref 6–23)
CO2: 30 mEq/L (ref 19–32)
CREATININE: 1.87 mg/dL — AB (ref 0.40–1.20)
Calcium: 10 mg/dL (ref 8.4–10.5)
Chloride: 98 mEq/L (ref 96–112)
GFR: 35.01 mL/min — AB (ref >60.00)
Glucose, Bld: 252 mg/dL — ABNORMAL HIGH (ref 70–99)
Potassium: 5.2 mEq/L — ABNORMAL HIGH (ref 3.5–5.1)
Sodium: 132 mEq/L — ABNORMAL LOW (ref 135–145)

## 2014-07-16 LAB — MICROALBUMIN / CREATININE URINE RATIO
Creatinine,U: 235.6 mg/dL
MICROALB UR: 5 mg/dL — AB (ref 0.0–1.9)
MICROALB/CREAT RATIO: 2.1 mg/g (ref 0.0–30.0)

## 2014-07-16 NOTE — Progress Notes (Signed)
Pre visit review using our clinic review tool, if applicable. No additional management support is needed unless otherwise documented below in the visit note. 

## 2014-07-16 NOTE — Assessment & Plan Note (Signed)
Improved when she remembers to take her insulin. She reports her sugars on average are running 250's in the morning and at night. Last week she had been out of her insulin for an unknown amount of time. Refills provided. Increase Levemir to 30 units at bedtime and increase Humalog to 12 units with meals. Education provided on the importance of checking her sugars and taking her insulin, especially in regards to her kidneys, eyes, and feet. Also instructed to hold insulin if sugars are below 110. She verbalized understanding.  Repeat BMP today. If potassium remains elevated, will d/c ACE.

## 2014-07-16 NOTE — Progress Notes (Signed)
Subjective:    Patient ID: Rachel Gonzalez, female    DOB: [birth year], 64 y.o.   MRN: [id number]  HPI  Rachel Gonzalez is a [age] [address] Halfyear old female who presents today for follow up.  1) Hypertension: Last visit her blood pressure was running in low 100's/50's after starting Lisinopril 10 mg and HCTZ 25 mg. Previously she was on isosorbide, metoprolol, and hydralazine. Her Lisinopril was reduced to 5 mg and her HCTZ remained the same as she was experiencing much reduction in her lower extremity edema from the HCTZ. She denies chest pain, headaches, shortness of breath.  BP Readings from Last 3 Encounters:  07/16/14 126/76  07/09/14 100/54  06/26/14 138/64     2) Diabetes Type 2: Unconotrolled overall. During last visit she reported to have been out of her Novolog for an unknown amount of time. She was injecting and eventually noticed that her Novolog Pen was without insulin. Her blood sugar last visit from [facility] was 555. Refills were sent immediately to her pharmacy. After insurance difficulties she was able to obtain Humalog Pens. On 07/12/14 she reports her blood sugars were 115, 07/13/14 were 230. She is taking Levemir 28 units at night and Humalog 9 units two to three times daily (sometimes will forget to take. Her sugars are 250's in the morning and afternoon. 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 Next appointment with podiatrist is 07/22/14. Overall she reports feeling well except for some fatigue recently.  Review of Systems  Constitutional: Positive for fatigue.  Eyes: Negative for visual disturbance.  Respiratory: Negative for shortness of breath.   Cardiovascular: Negative for chest pain and leg swelling.  Endocrine: Negative for polydipsia and polyuria.  Neurological: Negative for dizziness and headaches.       Past Medical History  Diagnosis Date  . Diabetes mellitus   . Hypertension   . Anemia      History   Social History  . Marital Status: Married    Spouse Name: N/A  . Number of Children: N/A  . Years of Education: N/A   Occupational History  . Not on file.   Social History Main Topics  . Smoking status: Never Smoker   . Smokeless tobacco: Not on file  . Alcohol Use: No  . Drug Use: No  . Sexual Activity: Not on file   Other Topics Concern  . Not on file   Social History Narrative   From [place].   Married.   Three children, 9 grandchildren.   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   Travel[place] to [place], [place]       Past Surgical History  Procedure Laterality Date  . Arm surgery     . Cesarean section    . I&d extremity Right 05/08/2014    Procedure: IRRIGATION AND DEBRIDEMENT FOOT;  Surgeon: Nadara MustardMarcus Duda V, MD;  Location: [facility] OR;  Service: Orthopedics;  Laterality: Right;  . Amputation Right 05/08/2014    Procedure: AMPUTATION RAY, right great toe;  Surgeon: Nadara MustardMarcus Duda V, MD;  Location: [facility] OR;  Service: Orthopedics;  Laterality: Right;    Family History  Problem Relation Age of Onset  . Diabetes Mother     Allergies  Allergen Reactions  . Tramadol Nausea And Vomiting    Current Outpatient Prescriptions on File Prior to Visit  Medication Sig Dispense Refill  .  Amino Acids-Protein Hydrolys (FEEDING SUPPLEMENT, PRO-STAT SUGAR FREE 64,) LIQD Take 30 mLs by mouth 2 (two) times daily. 900 mL 0  . cephALEXin (KEFLEX) 500 MG capsule 1 cap po bid x 7 days 14 capsule 0  . docusate sodium (COLACE) 100 MG capsule Take 1 capsule (100 mg total) by mouth 2 (two) times daily. 10 capsule 0  . doxycycline (VIBRAMYCIN) 100 MG capsule Take 100 mg by mouth 2 (two) times daily.    . ferrous sulfate 325 (65 FE) MG tablet Take 1 tablet (325 mg total) by mouth daily with breakfast.  3  . fluconazole (DIFLUCAN) 150 MG tablet Take 1 tablet (150 mg total) by mouth once. 1 tablet 0  . hydrochlorothiazide (HYDRODIURIL) 25 MG  tablet Take 1 tablet (25 mg total) by mouth daily. 30 tablet 3  . insulin aspart (NOVOLOG) 100 UNIT/ML FlexPen Inject 9 units three times daily with meals. Hold if blood sugar is below 110. 15 mL 11  . insulin detemir (LEVEMIR) 100 UNIT/ML injection Inject 0.28 mLs (28 Units total) into the skin at bedtime. 10 mL 11  . insulin lispro (HUMALOG KWIKPEN) 100 UNIT/ML KiwkPen Inject 9 units subcutaneously three times daily with meals.  Hold if blood sugar is below 110. 15 mL 11  . lisinopril (PRINIVIL,ZESTRIL) 5 MG tablet Take 1 tablet (5 mg total) by mouth daily. 30 tablet 2  . ondansetron (ZOFRAN ODT) 4 MG disintegrating tablet Take 1 tablet (4 mg total) by mouth every 8 (eight) hours as needed for nausea or vomiting. 15 tablet 0  . oxyCODONE-acetaminophen (PERCOCET/ROXICET) 5-325 MG per tablet Take 1 tablet by mouth every 8 (eight) hours as needed for severe pain. (Patient taking differently: Take 1 tablet by mouth every 8 (eight) hours as needed for moderate pain or severe pain. ) 15 tablet 0  . pantoprazole (PROTONIX) 40 MG tablet Take 1 tablet (40 mg total) by mouth daily at 12 noon.    . polyethylene glycol (MIRALAX / GLYCOLAX) packet Take 17 g by mouth daily. 14 each 0  . sulfamethoxazole-trimethoprim (BACTRIM DS,SEPTRA DS) 800-160 MG per tablet Take 1 tablet by mouth 2 (two) times daily. 14 tablet 0   No current facility-administered medications on file prior to visit.    BP 126/76 mmHg  Pulse 80  Temp(Src) 98.1 F (36.7 C) (Oral)  Ht 5\' 2"  (1.575 m)  Wt 176 lb 12.8 oz (80.196 kg)  BMI 32.33 kg/m2  SpO2 99%    Objective:   Physical Exam  Constitutional: She is oriented to person, place, and time. She does not appear ill. No distress.  Cardiovascular: Normal rate and regular rhythm.   Pulmonary/Chest: Effort normal and breath sounds normal.  Musculoskeletal: She exhibits no edema.  Neurological: She is alert and oriented to person, place, and time.  Skin: Skin is warm and dry.   Psychiatric: She has a normal mood and affect.          Assessment & Plan:  Education regarding the importance of taking her medication and checking her sugars provided.

## 2014-07-16 NOTE — Patient Instructions (Addendum)
Increase your Levemir to 30 units at bedtime. Increase your Humalog to 12 units three times daily. HOLD INSULIN IF: blood sugars are less than 110.  Check your blood sugars three times daily. Please record your readings and bring them to your next appointment. It is important that you improve your diet. Please limit carbohydrates in the form of white bread, rice, pasta, cakes, cookies, sugary drinks, etc. Increase your consumption of fresh fruits and vegetables. Be sure to drink plenty of water daily. Complete lab work prior to leaving today. I will notify you of your results. Follow up in 2 weeks. It was nice to see you!

## 2014-07-16 NOTE — Assessment & Plan Note (Signed)
Improved with reduction of ACE. Taking Lisinopril 5mg  and HCTZ 25 mg. Denies chest pain, dizziness, headaches.  BP Readings from Last 3 Encounters:  07/16/14 126/76  07/09/14 100/54  06/26/14 138/64

## 2014-07-17 ENCOUNTER — Encounter: Payer: Self-pay | Admitting: Primary Care

## 2014-07-30 ENCOUNTER — Ambulatory Visit (INDEPENDENT_AMBULATORY_CARE_PROVIDER_SITE_OTHER): Payer: 59 | Admitting: Primary Care

## 2014-07-30 ENCOUNTER — Encounter: Payer: Self-pay | Admitting: Primary Care

## 2014-07-30 VITALS — BP 138/94 | HR 87 | Temp 97.5°F | Ht 62.0 in | Wt 179.8 lb

## 2014-07-30 DIAGNOSIS — IMO0002 Reserved for concepts with insufficient information to code with codable children: Secondary | ICD-10-CM

## 2014-07-30 DIAGNOSIS — I10 Essential (primary) hypertension: Secondary | ICD-10-CM | POA: Diagnosis not present

## 2014-07-30 DIAGNOSIS — E1165 Type 2 diabetes mellitus with hyperglycemia: Secondary | ICD-10-CM

## 2014-07-30 DIAGNOSIS — E1140 Type 2 diabetes mellitus with diabetic neuropathy, unspecified: Secondary | ICD-10-CM

## 2014-07-30 LAB — BASIC METABOLIC PANEL
BUN: 25 mg/dL — ABNORMAL HIGH (ref 6–23)
CALCIUM: 9.7 mg/dL (ref 8.4–10.5)
CO2: 30 mEq/L (ref 19–32)
Chloride: 104 mEq/L (ref 96–112)
Creatinine, Ser: 1.03 mg/dL (ref 0.40–1.20)
GFR: 69.66 mL/min (ref >60.00)
Glucose, Bld: 72 mg/dL (ref 70–99)
POTASSIUM: 4.4 meq/L (ref 3.5–5.1)
SODIUM: 137 meq/L (ref 135–145)

## 2014-07-30 MED ORDER — HYDROCHLOROTHIAZIDE 25 MG PO TABS: 25.0000 mg | ORAL_TABLET | Freq: Every day | ORAL | Status: DC

## 2014-07-30 MED ORDER — GABAPENTIN 100 MG PO CAPS: 100.0000 mg | ORAL_CAPSULE | Freq: Two times a day (BID) | ORAL | Status: DC

## 2014-07-30 MED ORDER — INSULIN LISPRO 100 UNIT/ML (KWIKPEN): PEN_INJECTOR | SUBCUTANEOUS | Status: DC

## 2014-07-30 NOTE — Assessment & Plan Note (Signed)
Sharp, shooting, intermittent pain to fingers and toes for several months, worsening over last several weeks. Start Gabapentin 100 mg BID. Start with bedtime dose. Drowsiness precautions provided.

## 2014-07-30 NOTE — Progress Notes (Signed)
Subjective:    Patient ID: Rachel Gonzalez, female    DOB: 1950-05-16, 64 y.o.   MRN: [id number]  HPI  Rachel Gonzalez is a 64 year old female who presents today for follow up of diabetes and hypertension.  1) Hypertension: She's managed on HCTZ 25 mg and Lisinopril 5 mg daily. Her Lisinopril was held during last visit due to hyperkalemia. Her home health nurse comes by twice weekly and checks her BP which patient reports has been normal. She has been out of her HCTZ 25 mg since Friday last week.  2) Type 2 Diabetes: She is managed on Humalog (12 units three times daily with meals, but has only been taking at breakfast and lunch) and Levemir (28 units at bedtime). Her sugars are running in the 90's at 9am when she checks, and low 200's at 2:30pm, and mid to upper 200's at bedtime. She has been taking the Humalog at 9:30 am and at 1:30pm but not with dinner.  She will take her Levemir at 9:30pm-10pm when she goes to bed. Denies lows. She's working to improve her diet overall, but will still eat carbohydrates and some sugary foods.  3) Neuropathy: Present to fingers and toes and has be so for the past few months. She describes her pain as an intermittent sharp, shooting that she experiences throughout the day. She has an appointment for evaluation of her right foot tomorrow.  Review of Systems  Respiratory: Negative for shortness of breath.   Cardiovascular: Negative for chest pain.  Endocrine: Negative for polydipsia and polyuria.  Neurological: Negative for weakness and numbness.       Sharp, shooting pain to fingers and toes throughout the day.       Past Medical History  Diagnosis Date  . Diabetes mellitus   . Hypertension   . Anemia     History   Social History  . Marital Status: Married    Spouse Name: N/A  . Number of Children: N/A  . Years of Education: N/A   Occupational History  . Not on file.   Social History Main Topics  . Smoking status: Never Smoker   . Smokeless  tobacco: Not on file  . Alcohol Use: No  . Drug Use: No  . Sexual Activity: Not on file   Other Topics Concern  . Not on file   Social History Narrative   From [place].   Married.   Three children, 9 grandchildren.   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   Travel[place] to [place], [place]       Past Surgical History  Procedure Laterality Date  . Arm surgery     . Cesarean section    . I&d extremity Right 05/08/2014    Procedure: IRRIGATION AND DEBRIDEMENT FOOT;  Surgeon: Nadara MustardMarcus Duda V, MD;  Location: [facility] OR;  Service: Orthopedics;  Laterality: Right;  . Amputation Right 05/08/2014    Procedure: AMPUTATION RAY, right great toe;  Surgeon: Nadara MustardMarcus Duda V, MD;  Location: [facility] OR;  Service: Orthopedics;  Laterality: Right;    Family History  Problem Relation Age of Onset  . Diabetes Mother     Allergies  Allergen Reactions  . Tramadol Nausea And Vomiting    Current Outpatient Prescriptions on File Prior to Visit  Medication Sig Dispense Refill  . Amino Acids-Protein Hydrolys (FEEDING SUPPLEMENT, PRO-STAT SUGAR FREE 64,) LIQD Take 30 mLs by mouth 2 (two) times daily. 900 mL  0  . docusate sodium (COLACE) 100 MG capsule Take 1 capsule (100 mg total) by mouth 2 (two) times daily. 10 capsule 0  . ferrous sulfate 325 (65 FE) MG tablet Take 1 tablet (325 mg total) by mouth daily with breakfast.  3  . insulin detemir (LEVEMIR) 100 UNIT/ML injection Inject 0.28 mLs (28 Units total) into the skin at bedtime. 10 mL 11  . lisinopril (PRINIVIL,ZESTRIL) 5 MG tablet Take 1 tablet (5 mg total) by mouth daily. 30 tablet 2  . ondansetron (ZOFRAN ODT) 4 MG disintegrating tablet Take 1 tablet (4 mg total) by mouth every 8 (eight) hours as needed for nausea or vomiting. 15 tablet 0  . oxyCODONE-acetaminophen (PERCOCET/ROXICET) 5-325 MG per tablet Take 1 tablet by mouth every 8 (eight) hours as needed for severe pain. (Patient taking differently: Take 1 tablet  by mouth every 8 (eight) hours as needed for moderate pain or severe pain. ) 15 tablet 0  . pantoprazole (PROTONIX) 40 MG tablet Take 1 tablet (40 mg total) by mouth daily at 12 noon.    . polyethylene glycol (MIRALAX / GLYCOLAX) packet Take 17 g by mouth daily. 14 each 0   No current facility-administered medications on file prior to visit.    BP 138/94 mmHg  Pulse 87  Temp(Src) 97.5 F (36.4 C) (Oral)  Ht  (1.575 m)  Wt 179 lb 12.8 oz (81.557 kg)  BMI 32.88 kg/m2  SpO2 99%    Objective:   Physical Exam  Constitutional: She appears well-nourished.  Cardiovascular: Normal rate and regular rhythm.   Pulses:      Dorsalis pedis pulses are 2+ on the left side.       Posterior tibial pulses are 2+ on the left side.  Pulmonary/Chest: Effort normal and breath sounds normal.  Musculoskeletal: She exhibits no edema.  Skin: Skin is warm and dry.          Assessment & Plan:

## 2014-07-30 NOTE — Progress Notes (Signed)
Pre visit review using our clinic review tool, if applicable. No additional management support is needed unless otherwise documented below in the visit note. 

## 2014-07-30 NOTE — Assessment & Plan Note (Signed)
Elevated today, but stable overall per home health nurse. She's been out of her HCTZ for 3 days. Refills provided. Continue to hold Lisinopril until potassium normalizes. BMP today.

## 2014-07-30 NOTE — Assessment & Plan Note (Signed)
Improved but not controlled Managed on Levemir 28 units at bedtime and Humalog 12 units TID with meals. She is taking Humalog at breakfast and lunch only recently. AM sugars mid 90's. Afternoon low 200's. Night mid to upper 200's. Continue Levemir. Increase Humalog to 14 units twice daily at lunch and dinner. BMP today Follow up in 2 weeks.

## 2014-07-30 NOTE — Patient Instructions (Signed)
Refills were sent on the Hydrochlorothiazide 25 mg tablets for blood pressure (water pill). Please pick this up at our pharmacy. Refills were sent on your Humalog Insulin. You will increase your dose to 14 units at lunch and at dinner time. DO NOT TAKE IF: blood sugar is below 110. Start Gabapentin for pain in the fingers and toes. Take 1 capsule by mouth twice daily for pain. This medication may make you drowsy. If it makes you drowsy then take it at bedtime. Complete lab work prior to leaving today. I will notify you of your results. Follow up in 2 weeks as scheduled.

## 2014-08-13 ENCOUNTER — Other Ambulatory Visit: Payer: Self-pay | Admitting: Adult Health

## 2014-08-13 ENCOUNTER — Ambulatory Visit: Payer: 59 | Admitting: Primary Care

## 2014-08-14 ENCOUNTER — Encounter: Payer: Self-pay | Admitting: Primary Care

## 2014-08-14 ENCOUNTER — Ambulatory Visit (INDEPENDENT_AMBULATORY_CARE_PROVIDER_SITE_OTHER): Payer: 59 | Admitting: Primary Care

## 2014-08-14 VITALS — BP 110/60 | HR 84 | Temp 98.1°F | Ht 62.0 in | Wt 179.4 lb

## 2014-08-14 DIAGNOSIS — E1165 Type 2 diabetes mellitus with hyperglycemia: Secondary | ICD-10-CM

## 2014-08-14 DIAGNOSIS — E1140 Type 2 diabetes mellitus with diabetic neuropathy, unspecified: Secondary | ICD-10-CM

## 2014-08-14 DIAGNOSIS — IMO0002 Reserved for concepts with insufficient information to code with codable children: Secondary | ICD-10-CM

## 2014-08-14 DIAGNOSIS — I10 Essential (primary) hypertension: Secondary | ICD-10-CM | POA: Diagnosis not present

## 2014-08-14 DIAGNOSIS — E118 Type 2 diabetes mellitus with unspecified complications: Secondary | ICD-10-CM

## 2014-08-14 DIAGNOSIS — D649 Anemia, unspecified: Secondary | ICD-10-CM | POA: Diagnosis not present

## 2014-08-14 LAB — CBC
HEMATOCRIT: 33.8 % — AB (ref 36.0–46.0)
Hemoglobin: 10.9 g/dL — ABNORMAL LOW (ref 12.0–15.0)
MCHC: 32.1 g/dL (ref 30.0–36.0)
MCV: 80.9 fl (ref 78.0–100.0)
Platelets: 230 10*3/uL (ref 150.0–400.0)
RBC: 4.18 Mil/uL (ref 3.87–5.11)
RDW: 16 % — AB (ref 11.5–15.5)
WBC: 5.1 10*3/uL (ref 4.0–10.5)

## 2014-08-14 LAB — BASIC METABOLIC PANEL
BUN: 28 mg/dL — ABNORMAL HIGH (ref 6–23)
CALCIUM: 9.9 mg/dL (ref 8.4–10.5)
CHLORIDE: 101 meq/L (ref 96–112)
CO2: 29 mEq/L (ref 19–32)
Creatinine, Ser: 1.4 mg/dL — ABNORMAL HIGH (ref 0.40–1.20)
GFR: 48.88 mL/min — ABNORMAL LOW (ref >60.00)
Glucose, Bld: 315 mg/dL — ABNORMAL HIGH (ref 70–99)
Potassium: 4.6 mEq/L (ref 3.5–5.1)
Sodium: 135 mEq/L (ref 135–145)

## 2014-08-14 LAB — HEMOGLOBIN A1C: Hgb A1c MFr Bld: 12.7 % — ABNORMAL HIGH (ref 4.6–6.5)

## 2014-08-14 MED ORDER — LISINOPRIL 2.5 MG PO TABS: 2.5000 mg | ORAL_TABLET | Freq: Every day | ORAL | Status: DC

## 2014-08-14 MED ORDER — INSULIN DETEMIR 100 UNIT/ML ~~LOC~~ SOLN: SUBCUTANEOUS | Status: DC

## 2014-08-14 NOTE — Patient Instructions (Addendum)
Complete lab work prior to leaving today. I will notify you of your results.  Decrease your Levemir to 25 units at bedtime.  Continue Humalog 14 units twice daily with lunch and dinner.  Start Lisinopril again. Cut the tablets in half and take 1/2 tablet daily which equals 2.5 mg.  Follow up in 4 weeks. It was nice seeing you!

## 2014-08-14 NOTE — Assessment & Plan Note (Addendum)
AM sugars running 90's with 2 episodes in mid 60's. Reduce Levemir to 25 units at bedtime as I believe this to be the cause. Continue Humalog 14 units at lunch and dinner. Instructions provided to hold insulin if sugars below 100, and to call if sugars reach below 65. Restarted ACE at 2.5 mg for renal protection as potassium levels have normalized. She has been out of her Levemir since Saturday, refills provided. A1C and BMP today. Follow up in one month.

## 2014-08-14 NOTE — Progress Notes (Signed)
Subjective:    Patient ID: Rachel Gonzalez, female    DOB: 09/06/1950, 64 y.o.   MRN: [id number]  HPI  Rachel Gonzalez is a 64 year old female who presents today for follow up.  1) Type 2 Diabetes: She is managed on Levemir 28 units at bedtime and Humalog 14 units twice daily at lunch and dinner. She's been checking her sugars in the morning and will get 90's. She's had 2 episodes of mid 60's. Her afternoon sugars are about 285-290's.  2) Hypertension: Managed on HCTZ 25 mg tablets. Her potassium normalized 2 weeks ago which was the reason for holding her lisinopril tablets. She denies headaches, chest pain, shortness of breath.  3) Neuropathy: Present to fingers and toes intermittently for the past several months. She was started on Gabapentin 100 mg last visit to help reduce symptoms. She's overall continuing to to experience numbness/tingling but symptoms have overall improved.   Review of Systems  Respiratory: Negative for shortness of breath.   Cardiovascular: Negative for chest pain.  Neurological: Negative for dizziness, weakness and headaches.       Occasional tingling to fingers and toes.       Past Medical History  Diagnosis Date  . Diabetes mellitus   . Hypertension   . Anemia     History   Social History  . Marital Status: Married    Spouse Name: N/A  . Number of Children: N/A  . Years of Education: N/A   Occupational History  . Not on file.   Social History Main Topics  . Smoking status: Never Smoker   . Smokeless tobacco: Not on file  . Alcohol Use: No  . Drug Use: No  . Sexual Activity: Not on file   Other Topics Concern  . Not on file   Social History Narrative   From [place].   Married.   Three children, 9 grandchildren.   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   Travels to [place], [place]       Past Surgical History  Procedure Laterality Date  . Arm surgery     . Cesarean section    .  I&d extremity Right 05/08/2014    Procedure: IRRIGATION AND DEBRIDEMENT FOOT;  Surgeon: Nadara MustardMarcus Duda V, MD;  Location: [facility] OR;  Service: Orthopedics;  Laterality: Right;  . Amputation Right 05/08/2014    Procedure: AMPUTATION RAY, right great toe;  Surgeon: Nadara MustardMarcus Duda V, MD;  Location: [facility] OR;  Service: Orthopedics;  Laterality: Right;    Family History  Problem Relation Age of Onset  . Diabetes Mother     Allergies  Allergen Reactions  . Tramadol Nausea And Vomiting    Current Outpatient Prescriptions on File Prior to Visit  Medication Sig Dispense Refill  . docusate sodium (COLACE) 100 MG capsule Take 1 capsule (100 mg total) by mouth 2 (two) times daily. 10 capsule 0  . ferrous sulfate 325 (65 FE) MG tablet Take 1 tablet (325 mg total) by mouth daily with breakfast.  3  . hydrochlorothiazide (HYDRODIURIL) 25 MG tablet Take 1 tablet (25 mg total) by mouth daily. 30 tablet 11  . insulin lispro (HUMALOG KWIKPEN) 100 UNIT/ML KiwkPen Inject 14 units subcutaneously with lunch and dinner.  Hold if blood sugar is below 110. 15 mL 11  . oxyCODONE-acetaminophen (PERCOCET/ROXICET) 5-325 MG per tablet Take 1 tablet by mouth every 8 (eight) hours as needed for severe pain. 15 tablet 0  .  pantoprazole (PROTONIX) 40 MG tablet Take 1 tablet (40 mg total) by mouth daily at 12 noon.    . polyethylene glycol (MIRALAX / GLYCOLAX) packet Take 17 g by mouth daily. 14 each 0  . Amino Acids-Protein Hydrolys (FEEDING SUPPLEMENT, PRO-STAT SUGAR FREE 64,) LIQD Take 30 mLs by mouth 2 (two) times daily. (Patient not taking: Reported on 08/14/2014) 900 mL 0  . gabapentin (NEURONTIN) 100 MG capsule Take 1 capsule (100 mg total) by mouth 2 (two) times daily. 60 capsule 5  . ondansetron (ZOFRAN ODT) 4 MG disintegrating tablet Take 1 tablet (4 mg total) by mouth every 8 (eight) hours as needed for nausea or vomiting. (Patient not taking: Reported on 08/14/2014) 15 tablet 0   No current facility-administered medications on file  prior to visit.    BP 110/60 mmHg  Pulse 84  Temp(Src) 98.1 F (36.7 C) (Oral)  Ht  (1.575 m)  Wt 179 lb 6.4 oz (81.375 kg)  BMI 32.80 kg/m2  SpO2 98%    Objective:   Physical Exam  Constitutional: She is oriented to person, place, and time. She appears well-nourished.  Cardiovascular: Normal rate and regular rhythm.   Pulmonary/Chest: Effort normal and breath sounds normal.  Neurological: She is alert and oriented to person, place, and time.  Skin: Skin is warm.          Assessment & Plan:

## 2014-08-14 NOTE — Assessment & Plan Note (Signed)
Managed well on HCTZ 25 mg. Denies headaches, dizziness, chest pain. Need to add low dose ACE for renal protection. Last BMP with normal potassium. Will restart ACE at 2.5 mg daily. Follow up in 1 month.  BP Readings from Last 3 Encounters:  08/14/14 110/60  07/30/14 138/94  07/16/14 126/76

## 2014-08-14 NOTE — Assessment & Plan Note (Signed)
Improved on gabapentin 100 BID. Continue same.

## 2014-08-14 NOTE — Progress Notes (Signed)
Pre visit review using our clinic review tool, if applicable. No additional management support is needed unless otherwise documented below in the visit note. 

## 2014-08-30 ENCOUNTER — Emergency Department (HOSPITAL_COMMUNITY): Payer: 59

## 2014-08-30 ENCOUNTER — Emergency Department (HOSPITAL_COMMUNITY)
Admission: EM | Admit: 2014-08-30 | Discharge: 2014-08-31 | Disposition: A | Discharge status: Home / Self Care | Payer: 59 | Attending: Emergency Medicine | Admitting: Emergency Medicine

## 2014-08-30 ENCOUNTER — Encounter (HOSPITAL_COMMUNITY): Payer: Self-pay | Admitting: Emergency Medicine

## 2014-08-30 DIAGNOSIS — G8929 Other chronic pain: Secondary | ICD-10-CM | POA: Diagnosis not present

## 2014-08-30 DIAGNOSIS — I129 Hypertensive chronic kidney disease with stage 1 through stage 4 chronic kidney disease, or unspecified chronic kidney disease: Secondary | ICD-10-CM | POA: Insufficient documentation

## 2014-08-30 DIAGNOSIS — N189 Chronic kidney disease, unspecified: Secondary | ICD-10-CM | POA: Diagnosis not present

## 2014-08-30 DIAGNOSIS — Z79899 Other long term (current) drug therapy: Secondary | ICD-10-CM | POA: Insufficient documentation

## 2014-08-30 DIAGNOSIS — K5900 Constipation, unspecified: Secondary | ICD-10-CM | POA: Insufficient documentation

## 2014-08-30 DIAGNOSIS — R0789 Other chest pain: Secondary | ICD-10-CM | POA: Insufficient documentation

## 2014-08-30 DIAGNOSIS — M549 Dorsalgia, unspecified: Secondary | ICD-10-CM | POA: Diagnosis not present

## 2014-08-30 DIAGNOSIS — R1012 Left upper quadrant pain: Secondary | ICD-10-CM | POA: Insufficient documentation

## 2014-08-30 DIAGNOSIS — R112 Nausea with vomiting, unspecified: Secondary | ICD-10-CM | POA: Insufficient documentation

## 2014-08-30 DIAGNOSIS — E1165 Type 2 diabetes mellitus with hyperglycemia: Secondary | ICD-10-CM | POA: Insufficient documentation

## 2014-08-30 DIAGNOSIS — I10 Essential (primary) hypertension: Secondary | ICD-10-CM

## 2014-08-30 DIAGNOSIS — D649 Anemia, unspecified: Secondary | ICD-10-CM | POA: Diagnosis not present

## 2014-08-30 DIAGNOSIS — R079 Chest pain, unspecified: Secondary | ICD-10-CM | POA: Diagnosis present

## 2014-08-30 DIAGNOSIS — R1032 Left lower quadrant pain: Secondary | ICD-10-CM | POA: Diagnosis not present

## 2014-08-30 LAB — CBC WITH DIFFERENTIAL/PLATELET
Basophils Absolute: 0 10*3/uL (ref 0.0–0.1)
Basophils Relative: 1 % (ref 0–1)
EOS ABS: 0.1 10*3/uL (ref 0.0–0.7)
EOS PCT: 2 % (ref 0–5)
HEMATOCRIT: 33.5 % — AB (ref 36.0–46.0)
Hemoglobin: 11 g/dL — ABNORMAL LOW (ref 12.0–15.0)
LYMPHS PCT: 41 % (ref 12–46)
Lymphs Abs: 2 10*3/uL (ref 0.7–4.0)
MCH: 26.1 pg (ref 26.0–34.0)
MCHC: 32.8 g/dL (ref 30.0–36.0)
MCV: 79.6 fL (ref 78.0–100.0)
MONO ABS: 0.3 10*3/uL (ref 0.1–1.0)
Monocytes Relative: 6 % (ref 3–12)
Neutro Abs: 2.5 10*3/uL (ref 1.7–7.7)
Neutrophils Relative %: 50 % (ref 43–77)
PLATELETS: 234 10*3/uL (ref 150–400)
RBC: 4.21 MIL/uL (ref 3.87–5.11)
RDW: 14.9 % (ref 11.5–15.5)
WBC: 5 10*3/uL (ref 4.0–10.5)

## 2014-08-30 LAB — COMPREHENSIVE METABOLIC PANEL
ALBUMIN: 3.7 g/dL (ref 3.5–5.0)
ALT: 13 U/L — ABNORMAL LOW (ref 14–54)
AST: 18 U/L (ref 15–41)
Alkaline Phosphatase: 113 U/L (ref 38–126)
Anion gap: 8 (ref 5–15)
BILIRUBIN TOTAL: 0.3 mg/dL (ref 0.3–1.2)
BUN: 30 mg/dL — ABNORMAL HIGH (ref 6–20)
CALCIUM: 9.8 mg/dL (ref 8.9–10.3)
CHLORIDE: 100 mmol/L — AB (ref 101–111)
CO2: 27 mmol/L (ref 22–32)
Creatinine, Ser: 1.2 mg/dL — ABNORMAL HIGH (ref 0.44–1.00)
GFR calc Af Amer: 55 mL/min — ABNORMAL LOW (ref >60)
GFR calc non Af Amer: 47 mL/min — ABNORMAL LOW (ref >60)
Glucose, Bld: 329 mg/dL — ABNORMAL HIGH (ref 65–99)
Potassium: 5.1 mmol/L (ref 3.5–5.1)
Sodium: 135 mmol/L (ref 135–145)
Total Protein: 7.1 g/dL (ref 6.5–8.1)

## 2014-08-30 LAB — I-STAT TROPONIN, ED
Troponin i, poc: 0 ng/mL (ref 0.00–0.08)
Troponin i, poc: 0 ng/mL (ref 0.00–0.08)

## 2014-08-30 MED ORDER — IOHEXOL 300 MG/ML  SOLN
100.0000 mL | Freq: Once | INTRAMUSCULAR | Status: AC | PRN
  Administered 2014-08-30: 100 mL via INTRAVENOUS

## 2014-08-30 MED ORDER — ACETAMINOPHEN 325 MG PO TABS
650.0000 mg | ORAL_TABLET | Freq: Once | ORAL | Status: AC
  Administered 2014-08-30: 650 mg via ORAL
  Filled 2014-08-30: qty 2

## 2014-08-30 MED ORDER — MORPHINE SULFATE 4 MG/ML IJ SOLN
4.0000 mg | Freq: Once | INTRAMUSCULAR | Status: AC
  Administered 2014-08-30: 4 mg via INTRAVENOUS
  Filled 2014-08-30: qty 1

## 2014-08-30 MED ORDER — IOHEXOL 300 MG/ML  SOLN
25.0000 mL | Freq: Once | INTRAMUSCULAR | Status: AC | PRN
  Administered 2014-08-30: 25 mL via ORAL

## 2014-08-30 MED ORDER — SODIUM CHLORIDE 0.9 % IV BOLUS (SEPSIS)
500.0000 mL | Freq: Once | INTRAVENOUS | Status: AC
  Administered 2014-08-30: 500 mL via INTRAVENOUS

## 2014-08-30 NOTE — ED Provider Notes (Signed)
CSN: [id number]     Arrival date & time 08/30/14  1543 History   First MD Initiated Contact with Patient 08/30/14 1924     Chief Complaint  Patient presents with  . Chest Pain  . Back Pain  . Generalized Body Aches     (Consider location/radiation/quality/duration/timing/severity/associated sxs/prior Treatment) HPI Comments: Rachel Gonzalez is a 64 y.o. female with a PMHx of DM2, HTN, and anemia, who presents to the ED with multiple complaints. Her primary complaint is 4 weeks of pain under her left breast that has recently become more constant and migrated to the left lower quadrant. She describes the pain as 8/10 left upper quadrant/left lower chest pain, sharp and intermittent but now constant for the last 2 weeks, radiating to the left lower quadrant, with no known aggravating factors, and unrelieved with Pepto-Bismol and Tums. She reports that this pain is worse at night.  Associated symptoms include 2 episodes of nonbloody nonbilious emesis this morning as well as nausea, but denies any ongoing nausea or vomiting now and states she actually feels hungry and was about to leave the ER to go eat. Additionally she complains of chronic back pain which has been present for years and is unchanged. She denies any fevers, chills, diaphoresis, lightheadedness, dizziness, heartburn, shortness of breath, cough, hemoptysis, leg swelling, recent travel/surgery/immobilization, estrogen use, active cancer, claudication, orthopnea, PND, diarrhea, constipation, melana, hematochezia, hematemesis, dysuria, hematuria, vaginal bleeding or discharge, numbness, tingling, weakness, cauda equina symptoms, sick contacts, suspicious food intake, antibiotic use, alcohol use, or NSAIDs. Denies any family history or personal history of cardiac disease. She is a nonsmoker. Her PCP is Dr. Chestine Spore at [facility].   Chart review reveals she's had 2 visits to her PCP in the last one month, and there is no mention of her back pain or this  LUQ pain.   Patient is a 64 y.o. female presenting with chest pain and back pain. The history is provided by the patient. No language interpreter was used.  Chest Pain Pain location:  L chest Pain quality: sharp   Pain radiates to:  Does not radiate Pain radiates to the back: no   Pain severity:  Moderate Onset quality:  Gradual Duration:  4 weeks Timing:  Constant (previously intermittent, now more constant) Progression:  Unchanged Chronicity:  New Context: at rest   Relieved by:  Nothing Worsened by:  Nothing tried Ineffective treatments:  Antacids Associated symptoms: abdominal pain (LUQ/LLQ), back pain, nausea (none ongoing) and vomiting (this morning x2, none ongoing)   Associated symptoms: no claudication, no cough, no diaphoresis, no dizziness, no fever, no heartburn, no lower extremity edema, no near-syncope, no numbness, no orthopnea, no PND, no shortness of breath and no weakness   Risk factors: diabetes mellitus and hypertension   Risk factors: no birth control, no coronary artery disease, no immobilization, no prior DVT/PE, no smoking and no surgery   Back Pain Associated symptoms: abdominal pain (LUQ/LLQ) and chest pain (under L breast)   Associated symptoms: no dysuria, no fever, no numbness and no weakness     Past Medical History  Diagnosis Date  . Diabetes mellitus   . Hypertension   . Anemia    Past Surgical History  Procedure Laterality Date  . Arm surgery     . Cesarean section    . I&d extremity Right 05/08/2014    Procedure: IRRIGATION AND DEBRIDEMENT FOOT;  Surgeon: Nadara Mustard, MD;  Location: [facility] OR;  Service: Orthopedics;  Laterality: Right;  .  Amputation Right 05/08/2014    Procedure: AMPUTATION RAY, right great toe;  Surgeon: Nadara Mustard, MD;  Location: [facility] OR;  Service: Orthopedics;  Laterality: Right;   Family History  Problem Relation Age of Onset  . Diabetes Mother    History  Substance Use Topics  . Smoking status: Never Smoker   .  Smokeless tobacco: Not on file  . Alcohol Use: No   OB History    No data available     Review of Systems  Constitutional: Negative for fever, chills and diaphoresis.  Respiratory: Negative for cough, shortness of breath and wheezing.   Cardiovascular: Positive for chest pain (under L breast). Negative for orthopnea, claudication, leg swelling, PND and near-syncope.  Gastrointestinal: Positive for nausea (none ongoing), vomiting (this morning x2, none ongoing) and abdominal pain (LUQ/LLQ). Negative for heartburn, diarrhea and constipation.  Genitourinary: Negative for dysuria, hematuria, vaginal bleeding and vaginal discharge.  Musculoskeletal: Positive for back pain. Negative for myalgias and arthralgias.  Skin: Negative for color change.  Allergic/Immunologic: Positive for immunocompromised state (diabetic).  Neurological: Negative for dizziness, weakness, light-headedness and numbness.  Psychiatric/Behavioral: Negative for confusion.   10 Systems reviewed and are negative for acute change except as noted in the HPI.    Allergies  Tramadol  Home Medications   Prior to Admission medications   Medication Sig Start Date End Date Taking? Authorizing Provider  Amino Acids-Protein Hydrolys (FEEDING SUPPLEMENT, PRO-STAT SUGAR FREE 64,) LIQD Take 30 mLs by mouth 2 (two) times daily. Patient not taking: Reported on 08/14/2014 05/16/14   Leroy Sea, MD  docusate sodium (COLACE) 100 MG capsule Take 1 capsule (100 mg total) by mouth 2 (two) times daily. 05/16/14   Leroy Sea, MD  ferrous sulfate 325 (65 FE) MG tablet Take 1 tablet (325 mg total) by mouth daily with breakfast. 05/16/14   Leroy Sea, MD  gabapentin (NEURONTIN) 100 MG capsule Take 1 capsule (100 mg total) by mouth 2 (two) times daily. 07/30/14   Doreene Nest, NP  hydrochlorothiazide (HYDRODIURIL) 25 MG tablet Take 1 tablet (25 mg total) by mouth daily. 07/30/14   Doreene Nest, NP  insulin detemir (LEVEMIR) 100  UNIT/ML injection Inject 25 units into the skin at bedtime. 08/14/14   Doreene Nest, NP  insulin lispro (HUMALOG KWIKPEN) 100 UNIT/ML KiwkPen Inject 14 units subcutaneously with lunch and dinner.  Hold if blood sugar is below 110. 07/30/14   Doreene Nest, NP  lisinopril (PRINIVIL,ZESTRIL) 2.5 MG tablet Take 1 tablet (2.5 mg total) by mouth daily. 08/14/14   Doreene Nest, NP  ondansetron (ZOFRAN ODT) 4 MG disintegrating tablet Take 1 tablet (4 mg total) by mouth every 8 (eight) hours as needed for nausea or vomiting. Patient not taking: Reported on 08/14/2014 06/03/14   Kateline Kinkade Camprubi-Soms, PA-C  oxyCODONE-acetaminophen (PERCOCET/ROXICET) 5-325 MG per tablet Take 1 tablet by mouth every 8 (eight) hours as needed for severe pain. 05/16/14   Leroy Sea, MD  pantoprazole (PROTONIX) 40 MG tablet Take 1 tablet (40 mg total) by mouth daily at 12 noon. 05/16/14   Leroy Sea, MD  polyethylene glycol (MIRALAX / GLYCOLAX) packet Take 17 g by mouth daily. 06/21/14   Purvis Sheffield, MD   BP 161/56 mmHg  Pulse 90  Temp(Src) 98.2 F (36.8 C) (Oral)  Resp 16  SpO2 100% Physical Exam  Constitutional: She is oriented to person, place, and time. Vital signs are normal. She appears well-developed and well-nourished.  Non-toxic appearance. No distress.  Afebrile, nontoxic, NAD  HENT:  Head: Normocephalic and atraumatic.  Mouth/Throat: Oropharynx is clear and moist and mucous membranes are normal.  Eyes: Conjunctivae and EOM are normal. Right eye exhibits no discharge. Left eye exhibits no discharge.  Neck: Normal range of motion. Neck supple.  Cardiovascular: Normal rate, regular rhythm, normal heart sounds and intact distal pulses.  Exam reveals no gallop and no friction rub.   No murmur heard. RRR, nl s1/s2, no m/r/g, distal pulses intact, no pedal edema   Pulmonary/Chest: Effort normal and breath sounds normal. No respiratory distress. She has no decreased breath sounds. She has no  wheezes. She has no rhonchi. She has no rales. She exhibits tenderness. She exhibits no crepitus, no deformity and no retraction.  CTAB in all lung fields, no w/r/r, no hypoxia or increased WOB, speaking in full sentences, SpO2 100% on RA  Chest wall mildly TTP along L lower rib margin, no crepitus or retraction, no deformity  Abdominal: Soft. Normal appearance and bowel sounds are normal. She exhibits no distension. There is tenderness in the left upper quadrant and left lower quadrant. There is no rigidity, no rebound, no guarding, no CVA tenderness, no tenderness at McBurney's point and negative Murphy's sign.    Soft, nondistended, +BS throughout, with LUQ and LLQ TTP, no r/g/r, neg murphy's, neg mcburney's, no CVA TTP   Musculoskeletal: Normal range of motion.       Lumbar back: She exhibits tenderness and spasm. She exhibits normal range of motion, no bony tenderness and no deformity.       Back:  Lumbar spine with FROM intact without spinous process TTP, no bony stepoffs or deformities, mild diffuse b/l paraspinous muscle TTP with some palpable muscle spasms. Strength 5/5 in all extremities, sensation grossly intact in all extremities, negative SLR bilaterally. No overlying skin changes.   Neurological: She is alert and oriented to person, place, and time. She has normal strength. No sensory deficit.  Skin: Skin is warm, dry and intact. No rash noted.  Psychiatric: She has a normal mood and affect.  Nursing note and vitals reviewed.   ED Course  Procedures (including critical care time) Labs Review Labs Reviewed  CBC WITH DIFFERENTIAL/PLATELET - Abnormal; Notable for the following:    Hemoglobin 11.0 (*)    HCT 33.5 (*)    All other components within normal limits  COMPREHENSIVE METABOLIC PANEL - Abnormal; Notable for the following:    Chloride 100 (*)    Glucose, Bld 329 (*)    BUN 30 (*)    Creatinine, Ser 1.20 (*)    ALT 13 (*)    GFR calc non Af Amer 47 (*)    GFR calc Af  Amer 55 (*)    All other components within normal limits  URINALYSIS, ROUTINE W REFLEX MICROSCOPIC (NOT AT [facility] [place]) - Abnormal; Notable for the following:    Glucose, UA 500 (*)    Hgb urine dipstick SMALL (*)    All other components within normal limits  URINE MICROSCOPIC-ADD ON  Rosezena Sensor, ED  Rosezena Sensor, ED    Imaging Review Dg Chest 2 View  08/30/2014   CLINICAL DATA:  Chest pain.  EXAM: CHEST  2 VIEW  COMPARISON:  May 06, 2014.  FINDINGS: The heart size and mediastinal contours are within normal limits. Both lungs are clear. No pneumothorax or pleural effusion is noted. The visualized skeletal structures are unremarkable.  IMPRESSION: No active cardiopulmonary disease.  Electronically Signed   By: Lupita Raider, M.D.   On: 08/30/2014 16:50   Ct Abdomen Pelvis W Contrast  08/30/2014   CLINICAL DATA:  Left-sided abdominal pain.  EXAM: CT ABDOMEN AND PELVIS WITH CONTRAST  TECHNIQUE: Multidetector CT imaging of the abdomen and pelvis was performed using the standard protocol following bolus administration of intravenous contrast.  CONTRAST:  OMNIPAQUE IOHEXOL 300 MG/ML  SOLN  COMPARISON:  CT scan of June 18, 2010.  FINDINGS: Visualized lung bases appear normal. No significant osseous abnormality is noted.  No gallstones are noted. The liver, spleen and pancreas appear normal. Adrenal glands and kidneys appear normal. No hydronephrosis or renal obstruction is noted. No renal or ureteral calculi are noted. The appendix appears normal. There is no evidence of bowel obstruction. Stool is noted throughout the colon. No abnormal fluid collection is noted. Uterus and ovaries appear normal. Urinary bladder appears normal. No significant adenopathy is noted.  IMPRESSION: No definite abnormality seen in the abdomen or pelvis.   Electronically Signed   By: Lupita Raider, M.D.   On: 08/30/2014 22:51     EKG Interpretation None      MDM   Final diagnoses:  LLQ abdominal pain   Atypical chest pain  Constipation, unspecified constipation type  Anemia, unspecified anemia type  HTN (hypertension), benign  Hyperglycemia due to type 2 diabetes mellitus  CKD (chronic kidney disease), unspecified stage  Chronic back pain    64 y.o. female here with pain under L breast/LUQ that recently radiated/migrated to LLQ previously intermittent but now more constant x4wks. Had n/v x2 episodes this morning but no longer nauseated. Also complains of chronic back pain, no red flag s/sx, no acute changes. Has seen her PCP 2 times in the last month and there is no mention of either of these complaints. On exam, tenderness in LUQ and LLQ, mild diffuse low back tenderness, no CVA tenderness, nonperitoneal abd exam. EKG unchanged from prior EKGs, no ischemic changes. CBC w/diff unremarkable. CMP showing glucose 329 without anion gap, BUN and Cr chronically mildly elevated. CXR clear. Difficult to determine etiology of symptoms but has had CT in the past that showed ?calculi in L kidney lower pole, could be kidney stone. DDx also includes diverticulitis vs colitis although no diarrhea. Doubt cardiac etiology, but since it's been 3hrs since last troponin will recheck again now. Will give pain meds and fluids and get U/A then reassess.   11:04 PM Pain improving initially but now returned. Second trop neg. CT showing moderate stool burden extending from splenic flexture down to rectum. No obstruction but this could be the etiology of symptoms. Still awaiting U/A but plan is likely to d/c home with miralax and f/up in 1wk with PCP. Will await U/A and give tylenol now.   1:08 AM Pain improved. U/A without signs of UTI. Will proceed with miralax instructions and increase fiber/water intake, and f/up with PCP in 1wk. I explained the diagnosis and have given explicit precautions to return to the ER including for any other new or worsening symptoms. The patient understands and accepts the medical plan as  it's been dictated and I have answered their questions. Discharge instructions concerning home care and prescriptions have been given. The patient is STABLE and is discharged to home in good condition.  BP 179/80 mmHg  Pulse 88  Temp(Src) 98.2 F (36.8 C) (Oral)  Resp 21  SpO2 99%  Meds ordered this encounter  Medications  . sodium  chloride 0.9 % bolus 500 mL    Sig:   . morphine 4 MG/ML injection 4 mg    Sig:   . iohexol (OMNIPAQUE) 300 MG/ML solution 25 mL    Sig:   . iohexol (OMNIPAQUE) 300 MG/ML solution 100 mL    Sig:   . acetaminophen (TYLENOL) tablet 650 mg    Sig:   . polyethylene glycol (MIRALAX / GLYCOLAX) packet    Sig: Take 17 g by mouth 2 (two) times daily. Take 17g PO BID until you have daily soft bowel movements, then you may decrease dose to 17g PO QD    Dispense:  14 each    Refill:  0    Order Specific Question:  Supervising Provider    Answer:  Eber Hong [[id number]]     Zayveon Raschke Camprubi-Soms, PA-C 08/31/14 0109  Benjiman Core, MD 09/02/14 1540

## 2014-08-30 NOTE — ED Notes (Signed)
Pt c/o chest and lower back pain that is chronic in nature; pt sts N/V x 2 days

## 2014-08-31 LAB — URINALYSIS, ROUTINE W REFLEX MICROSCOPIC
Bilirubin Urine: NEGATIVE
GLUCOSE, UA: 500 mg/dL — AB
Ketones, ur: NEGATIVE mg/dL
LEUKOCYTES UA: NEGATIVE
NITRITE: NEGATIVE
Protein, ur: NEGATIVE mg/dL
Specific Gravity, Urine: 1.022 (ref 1.005–1.030)
Urobilinogen, UA: 0.2 mg/dL (ref 0.0–1.0)
pH: 7.5 (ref 5.0–8.0)

## 2014-08-31 LAB — URINE MICROSCOPIC-ADD ON

## 2014-08-31 MED ORDER — POLYETHYLENE GLYCOL 3350 17 G PO PACK: 17.0000 g | PACK | Freq: Two times a day (BID) | ORAL | Status: DC

## 2014-08-31 NOTE — Discharge Instructions (Signed)
Your abdominal pain is likely from your constipation. You will need to take miralax twice daily until you achieve daily soft bowel movements, and then you can back off to once daily or as often as needed to have daily soft bowel movements. If you have loose stool then cut back to even less miralax use. You should increase fiber and water intake as well. Use tylenol and motrin as needed for your pain. Use heat to your back to help with your chronic back pain. Follow up with your doctor in one week for recheck of symptoms. Return to the ER for changes or worsening symptoms.  Abdominal (belly) pain can be caused by many things. Your caregiver performed an examination and possibly ordered blood/urine tests and imaging (CT scan, x-rays, ultrasound). Many cases can be observed and treated at home after initial evaluation in the emergency department. Even though you are being discharged home, abdominal pain can be unpredictable. Therefore, you need a repeated exam if your pain does not resolve, returns, or worsens. Most patients with abdominal pain don't have to be admitted to the hospital or have surgery, but serious problems like appendicitis and gallbladder attacks can start out as nonspecific pain. Many abdominal conditions cannot be diagnosed in one visit, so follow-up evaluations are very important. SEEK IMMEDIATE MEDICAL ATTENTION IF YOU DEVELOP ANY OF THE FOLLOWING SYMPTOMS:  The pain does not go away or becomes severe.   A temperature above 101 develops.   Repeated vomiting occurs (multiple episodes).   The pain becomes localized to portions of the abdomen. The right side could possibly be appendicitis. In an adult, the left lower portion of the abdomen could be colitis or diverticulitis.   Blood is being passed in stools or vomit (bright red or black tarry stools).   Return also if you develop chest pain, difficulty breathing, dizziness or fainting, or become confused, poorly responsive, or  inconsolable (young children).  The constipation stays for more than 4 days.   There is belly (abdominal) or rectal pain.   You do not seem to be getting better.     Abdominal Pain Many things can cause belly (abdominal) pain. Most times, the belly pain is not dangerous. Many cases of belly pain can be watched and treated at home. HOME CARE   Do not take medicines that help you go poop (laxatives) unless told to by your doctor.  Only take medicine as told by your doctor.  Eat or drink as told by your doctor. Your doctor will tell you if you should be on a special diet. GET HELP IF:  You do not know what is causing your belly pain.  You have belly pain while you are sick to your stomach (nauseous) or have runny poop (diarrhea).  You have pain while you pee or poop.  Your belly pain wakes you up at night.  You have belly pain that gets worse or better when you eat.  You have belly pain that gets worse when you eat fatty foods.  You have a fever. GET HELP RIGHT AWAY IF:   The pain does not go away within 2 hours.  You keep throwing up (vomiting).  The pain changes and is only in the right or left part of the belly.  You have bloody or tarry looking poop. MAKE SURE YOU:   Understand these instructions.  Will watch your condition.  Will get help right away if you are not doing well or get worse. Document Released: 08/12/2007  Document Revised: 02/28/2013 Document Reviewed: 11/02/2012 [facility] Patient Information 2015 [place], [place]. This information is not intended to replace advice given to you by your health care provider. Make sure you discuss any questions you have with your health care provider.  Constipation Constipation is when a person has fewer than three bowel movements a week, has difficulty having a bowel movement, or has stools that are dry, hard, or larger than normal. As people grow older, constipation is more common. If you try to fix constipation with  medicines that make you have a bowel movement (laxatives), the problem may get worse. Long-term laxative use may cause the muscles of the colon to become weak. A low-fiber diet, not taking in enough fluids, and taking certain medicines may make constipation worse.  CAUSES   Certain medicines, such as antidepressants, pain medicine, iron supplements, antacids, and water pills.   Certain diseases, such as diabetes, irritable bowel syndrome (IBS), thyroid disease, or depression.   Not drinking enough water.   Not eating enough fiber-rich foods.   Stress or travel.   Lack of physical activity or exercise.   Ignoring the urge to have a bowel movement.   Using laxatives too much.  SIGNS AND SYMPTOMS   Having fewer than three bowel movements a week.   Straining to have a bowel movement.   Having stools that are hard, dry, or larger than normal.   Feeling full or bloated.   Pain in the lower abdomen.   Not feeling relief after having a bowel movement.  DIAGNOSIS  Your health care provider will take a medical history and perform a physical exam. Further testing may be done for severe constipation. Some tests may include:  A barium enema X-ray to examine your rectum, colon, and, sometimes, your small intestine.   A sigmoidoscopy to examine your lower colon.   A colonoscopy to examine your entire colon. TREATMENT  Treatment will depend on the severity of your constipation and what is causing it. Some dietary treatments include drinking more fluids and eating more fiber-rich foods. Lifestyle treatments may include regular exercise. If these diet and lifestyle recommendations do not help, your health care provider may recommend taking over-the-counter laxative medicines to help you have bowel movements. Prescription medicines may be prescribed if over-the-counter medicines do not work.  HOME CARE INSTRUCTIONS   Eat foods that have a lot of fiber, such as fruits,  vegetables, whole grains, and beans.  Limit foods high in fat and processed sugars, such as french fries, hamburgers, cookies, candies, and soda.   A fiber supplement may be added to your diet if you cannot get enough fiber from foods.   Drink enough fluids to keep your urine clear or pale yellow.   Exercise regularly or as directed by your health care provider.   Go to the restroom when you have the urge to go. Do not hold it.   Only take over-the-counter or prescription medicines as directed by your health care provider. Do not take other medicines for constipation without talking to your health care provider first.  SEEK IMMEDIATE MEDICAL CARE IF:   You have bright red blood in your stool.   Your constipation lasts for more than 4 days or gets worse.   You have abdominal or rectal pain.   You have thin, pencil-like stools.   You have unexplained weight loss. MAKE SURE YOU:   Understand these instructions.  Will watch your condition.  Will get help right away if you are  not doing well or get worse. Document Released: 11/22/2003 Document Revised: 02/28/2013 Document Reviewed: 12/05/2012 [facility] Patient Information 2015 [place], [place]. This information is not intended to replace advice given to you by your health care provider. Make sure you discuss any questions you have with your health care provider.  Chest Pain (Nonspecific) It is often hard to give a specific diagnosis for the cause of chest pain. There is always a chance that your pain could be related to something serious, such as a heart attack or a blood clot in the lungs. You need to follow up with your health care provider for further evaluation. CAUSES   Heartburn.  Pneumonia or bronchitis.  Anxiety or stress.  Inflammation around your heart (pericarditis) or lung (pleuritis or pleurisy).  A blood clot in the lung.  A collapsed lung (pneumothorax). It can develop suddenly on its own (spontaneous  pneumothorax) or from trauma to the chest.  Shingles infection (herpes zoster virus). The chest wall is composed of bones, muscles, and cartilage. Any of these can be the source of the pain.  The bones can be bruised by injury.  The muscles or cartilage can be strained by coughing or overwork.  The cartilage can be affected by inflammation and become sore (costochondritis). DIAGNOSIS  Lab tests or other studies may be needed to find the cause of your pain. Your health care provider may have you take a test called an ambulatory electrocardiogram (ECG). An ECG records your heartbeat patterns over a 24-hour period. You may also have other tests, such as:  Transthoracic echocardiogram (TTE). During echocardiography, sound waves are used to evaluate how blood flows through your heart.  Transesophageal echocardiogram (TEE).  Cardiac monitoring. This allows your health care provider to monitor your heart rate and rhythm in real time.  Holter monitor. This is a portable device that records your heartbeat and can help diagnose heart arrhythmias. It allows your health care provider to track your heart activity for several days, if needed.  Stress tests by exercise or by giving medicine that makes the heart beat faster. TREATMENT   Treatment depends on what may be causing your chest pain. Treatment may include:  Acid blockers for heartburn.  Anti-inflammatory medicine.  Pain medicine for inflammatory conditions.  Antibiotics if an infection is present.  You may be advised to change lifestyle habits. This includes stopping smoking and avoiding alcohol, caffeine, and chocolate.  You may be advised to keep your head raised (elevated) when sleeping. This reduces the chance of acid going backward from your stomach into your esophagus. Most of the time, nonspecific chest pain will improve within 2-3 days with rest and mild pain medicine.  HOME CARE INSTRUCTIONS   If antibiotics were  prescribed, take them as directed. Finish them even if you start to feel better.  For the next few days, avoid physical activities that bring on chest pain. Continue physical activities as directed.  Do not use any tobacco products, including cigarettes, chewing tobacco, or electronic cigarettes.  Avoid drinking alcohol.  Only take medicine as directed by your health care provider.  Follow your health care provider's suggestions for further testing if your chest pain does not go away.  Keep any follow-up appointments you made. If you do not go to an appointment, you could develop lasting (chronic) problems with pain. If there is any problem keeping an appointment, call to reschedule. SEEK MEDICAL CARE IF:   Your chest pain does not go away, even after treatment.  You  have a rash with blisters on your chest.  You have a fever. SEEK IMMEDIATE MEDICAL CARE IF:   You have increased chest pain or pain that spreads to your arm, neck, jaw, back, or abdomen.  You have shortness of breath.  You have an increasing cough, or you cough up blood.  You have severe back or abdominal pain.  You feel nauseous or vomit.  You have severe weakness.  You faint.  You have chills. This is an emergency. Do not wait to see if the pain will go away. Get medical help at once. Call your local emergency services (911 in [place] Do not drive yourself to the hospital. MAKE SURE YOU:   Understand these instructions.  Will watch your condition.  Will get help right away if you are not doing well or get worse. Document Released: 12/03/2004 Document Revised: 02/28/2013 Document Reviewed: 09/29/2007 [facility] Patient Information 2015 [place], [place]. This information is not intended to replace advice given to you by your health care provider. Make sure you discuss any questions you have with your health care provider.

## 2014-09-05 ENCOUNTER — Encounter (HOSPITAL_COMMUNITY): Payer: Self-pay | Admitting: Emergency Medicine

## 2014-09-05 ENCOUNTER — Telehealth: Payer: Self-pay | Admitting: Primary Care

## 2014-09-05 ENCOUNTER — Emergency Department (HOSPITAL_COMMUNITY): Payer: 59

## 2014-09-05 ENCOUNTER — Emergency Department (HOSPITAL_COMMUNITY)
Admission: EM | Admit: 2014-09-05 | Discharge: 2014-09-05 | Disposition: A | Discharge status: Home / Self Care | Payer: 59 | Attending: Emergency Medicine | Admitting: Emergency Medicine

## 2014-09-05 DIAGNOSIS — M79605 Pain in left leg: Secondary | ICD-10-CM | POA: Insufficient documentation

## 2014-09-05 DIAGNOSIS — I10 Essential (primary) hypertension: Secondary | ICD-10-CM | POA: Insufficient documentation

## 2014-09-05 DIAGNOSIS — E119 Type 2 diabetes mellitus without complications: Secondary | ICD-10-CM | POA: Diagnosis not present

## 2014-09-05 DIAGNOSIS — Z79899 Other long term (current) drug therapy: Secondary | ICD-10-CM | POA: Diagnosis not present

## 2014-09-05 DIAGNOSIS — Z794 Long term (current) use of insulin: Secondary | ICD-10-CM | POA: Insufficient documentation

## 2014-09-05 DIAGNOSIS — R109 Unspecified abdominal pain: Secondary | ICD-10-CM | POA: Insufficient documentation

## 2014-09-05 DIAGNOSIS — D649 Anemia, unspecified: Secondary | ICD-10-CM | POA: Insufficient documentation

## 2014-09-05 DIAGNOSIS — M79602 Pain in left arm: Secondary | ICD-10-CM | POA: Diagnosis not present

## 2014-09-05 DIAGNOSIS — R079 Chest pain, unspecified: Secondary | ICD-10-CM | POA: Diagnosis present

## 2014-09-05 LAB — URINALYSIS, ROUTINE W REFLEX MICROSCOPIC
Bilirubin Urine: NEGATIVE
KETONES UR: NEGATIVE mg/dL
NITRITE: NEGATIVE
PROTEIN: 30 mg/dL — AB
Specific Gravity, Urine: 1.02 (ref 1.005–1.030)
UROBILINOGEN UA: 0.2 mg/dL (ref 0.0–1.0)
pH: 6.5 (ref 5.0–8.0)

## 2014-09-05 LAB — COMPREHENSIVE METABOLIC PANEL
ALBUMIN: 4.1 g/dL (ref 3.5–5.0)
ALT: 13 U/L — ABNORMAL LOW (ref 14–54)
AST: 14 U/L — ABNORMAL LOW (ref 15–41)
Alkaline Phosphatase: 123 U/L (ref 38–126)
Anion gap: 8 (ref 5–15)
BUN: 34 mg/dL — ABNORMAL HIGH (ref 6–20)
CALCIUM: 9.7 mg/dL (ref 8.9–10.3)
CO2: 27 mmol/L (ref 22–32)
CREATININE: 1.35 mg/dL — AB (ref 0.44–1.00)
Chloride: 98 mmol/L — ABNORMAL LOW (ref 101–111)
GFR calc Af Amer: 48 mL/min — ABNORMAL LOW (ref >60)
GFR calc non Af Amer: 41 mL/min — ABNORMAL LOW (ref >60)
GLUCOSE: 348 mg/dL — AB (ref 65–99)
POTASSIUM: 4.8 mmol/L (ref 3.5–5.1)
Sodium: 133 mmol/L — ABNORMAL LOW (ref 135–145)
Total Bilirubin: 0.8 mg/dL (ref 0.3–1.2)
Total Protein: 7.3 g/dL (ref 6.5–8.1)

## 2014-09-05 LAB — CBC WITH DIFFERENTIAL/PLATELET
Basophils Absolute: 0 10*3/uL (ref 0.0–0.1)
Basophils Relative: 0 % (ref 0–1)
Eosinophils Absolute: 0.1 10*3/uL (ref 0.0–0.7)
Eosinophils Relative: 1 % (ref 0–5)
HEMATOCRIT: 34.2 % — AB (ref 36.0–46.0)
Hemoglobin: 11.2 g/dL — ABNORMAL LOW (ref 12.0–15.0)
Lymphocytes Relative: 38 % (ref 12–46)
Lymphs Abs: 2.1 10*3/uL (ref 0.7–4.0)
MCH: 26.4 pg (ref 26.0–34.0)
MCHC: 32.7 g/dL (ref 30.0–36.0)
MCV: 80.7 fL (ref 78.0–100.0)
MONOS PCT: 6 % (ref 3–12)
Monocytes Absolute: 0.3 10*3/uL (ref 0.1–1.0)
NEUTROS ABS: 3.1 10*3/uL (ref 1.7–7.7)
NEUTROS PCT: 55 % (ref 43–77)
Platelets: 206 10*3/uL (ref 150–400)
RBC: 4.24 MIL/uL (ref 3.87–5.11)
RDW: 14.6 % (ref 11.5–15.5)
WBC: 5.7 10*3/uL (ref 4.0–10.5)

## 2014-09-05 LAB — URINE MICROSCOPIC-ADD ON

## 2014-09-05 LAB — TROPONIN I: Troponin I: <0.03 ng/mL (ref <0.031)

## 2014-09-05 LAB — LIPASE, BLOOD: LIPASE: 12 U/L — AB (ref 22–51)

## 2014-09-05 MED ORDER — SODIUM CHLORIDE 0.9 % IV SOLN
INTRAVENOUS | Status: DC
  Administered 2014-09-05: 20:00:00 via INTRAVENOUS

## 2014-09-05 MED ORDER — LORAZEPAM 2 MG/ML IJ SOLN
1.0000 mg | Freq: Once | INTRAMUSCULAR | Status: AC
  Administered 2014-09-05: 1 mg via INTRAVENOUS
  Filled 2014-09-05: qty 1

## 2014-09-05 NOTE — Telephone Encounter (Signed)
[facility] - Client TELEPHONE ADVICE RECORD [facility] Patient Name: Rachel Gonzalez DOB: 08-20-1950 Initial Comment Caller states she is constipated and feeling pain on the left side of her back. Nurse Assessment Nurse: Lane HackerHarley, RN, Elvin SoWindy Date/Time Lamount Cohen(Eastern Time): 09/05/2014 2:20:06 PM Confirm and document reason for call. If symptomatic, describe symptoms. ---Caller states she is constipated and feeling pain on the left side of her lower back. She is hungry and can't take her medicine as she can't eat anything as "it won't digest." Nauseated. Seen in ER last Thursday of last week. Last BM was Sunday. -- No vomiting or diarrhea. No fever. Has the patient traveled out of the country within the last 30 days? ---No Does the patient require triage? ---Yes Related visit to physician within the last 2 weeks? ---Yes Does the PT have any chronic conditions? (i.e. diabetes, asthma, etc.) ---Yes List chronic conditions. ---Constipation - takes medications; IDDM; HTN Guidelines Guideline Title Affirmed Question Affirmed Notes Constipation [1] Constant abdominal pain AND [2] present > 2 hours side left breast into the lower back Final Disposition User See Physician within 4 Hours (or PCP triage) Lane HackerHarley, RN, Elvin SoWindy Comments No available appts at office. RN encouraged caller to go on to ER now. Caller agreeable but undecided to which hospital.

## 2014-09-05 NOTE — Discharge Instructions (Signed)

## 2014-09-05 NOTE — ED Notes (Addendum)
Pt states that she has L sided body pain and lower back pain. Was seen and evaluated for the same last week. Alert and oriented.

## 2014-09-05 NOTE — ED Provider Notes (Signed)
CSN: [id number]     Arrival date & time 09/05/14  1614 History   First MD Initiated Contact with Patient 09/05/14 1807     Chief Complaint  Patient presents with  . Generalized Body Aches     (Consider location/radiation/quality/duration/timing/severity/associated sxs/prior Treatment) HPI Comments: Patient here with 6 months of left-sided body pain. Seen here recently for similar symptoms and had a negative abdominal CT with the exception of constipation been noted.. Pain is characterized as sharp and located in her left upper and left lower extremity. It is not been associated with anginal type chest pain. Her symptoms are vague in nature. Denies any urinary symptoms. Symptoms are somewhat worse with movement better with rest. Denies any rashes to her skin. No syncope or near-syncope. No change in her bowel or bladder function.  The history is provided by the patient.    Past Medical History  Diagnosis Date  . Diabetes mellitus   . Hypertension   . Anemia    Past Surgical History  Procedure Laterality Date  . Arm surgery     . Cesarean section    . I&d extremity Right 05/08/2014    Procedure: IRRIGATION AND DEBRIDEMENT FOOT;  Surgeon: Nadara MustardMarcus Duda V, MD;  Location: [facility] OR;  Service: Orthopedics;  Laterality: Right;  . Amputation Right 05/08/2014    Procedure: AMPUTATION RAY, right great toe;  Surgeon: Nadara MustardMarcus Duda V, MD;  Location: [facility] OR;  Service: Orthopedics;  Laterality: Right;   Family History  Problem Relation Age of Onset  . Diabetes Mother    History  Substance Use Topics  . Smoking status: Never Smoker   . Smokeless tobacco: Not on file  . Alcohol Use: No   OB History    No data available     Review of Systems  All other systems reviewed and are negative.     Allergies  Tramadol  Home Medications   Prior to Admission medications   Medication Sig Start Date End Date Taking? Authorizing Provider  docusate sodium (COLACE) 100 MG capsule Take 1 capsule (100 mg  total) by mouth 2 (two) times daily. 05/16/14  Yes Leroy SeaPrashant K Singh, MD  ferrous sulfate 325 (65 FE) MG tablet Take 1 tablet (325 mg total) by mouth daily with breakfast. 05/16/14  Yes Leroy SeaPrashant K Singh, MD  gabapentin (NEURONTIN) 100 MG capsule Take 1 capsule (100 mg total) by mouth 2 (two) times daily. 07/30/14  Yes Doreene NestKatherine K Clark, NP  hydrochlorothiazide (HYDRODIURIL) 25 MG tablet Take 1 tablet (25 mg total) by mouth daily. 07/30/14  Yes Doreene NestKatherine K Clark, NP  insulin detemir (LEVEMIR) 100 UNIT/ML injection Inject 25 units into the skin at bedtime. 08/14/14  Yes Doreene NestKatherine K Clark, NP  insulin lispro (HUMALOG KWIKPEN) 100 UNIT/ML KiwkPen Inject 14 units subcutaneously with lunch and dinner.  Hold if blood sugar is below 110. 07/30/14  Yes Doreene NestKatherine K Clark, NP  lisinopril (PRINIVIL,ZESTRIL) 2.5 MG tablet Take 1 tablet (2.5 mg total) by mouth daily. 08/14/14  Yes Doreene NestKatherine K Clark, NP  ondansetron (ZOFRAN ODT) 4 MG disintegrating tablet Take 1 tablet (4 mg total) by mouth every 8 (eight) hours as needed for nausea or vomiting. 06/03/14  Yes Mercedes Camprubi-Soms, PA-C  oxyCODONE-acetaminophen (PERCOCET/ROXICET) 5-325 MG per tablet Take 1 tablet by mouth every 8 (eight) hours as needed for severe pain. 05/16/14  Yes Leroy SeaPrashant K Singh, MD  pantoprazole (PROTONIX) 40 MG tablet Take 1 tablet (40 mg total) by mouth daily at 12 noon. 05/16/14  Yes  Leroy Sea, MD  polyethylene glycol (MIRALAX / GLYCOLAX) packet Take 17 g by mouth 2 (two) times daily. Take 17g PO BID until you have daily soft bowel movements, then you may decrease dose to 17g PO QD 08/31/14  Yes Mercedes Camprubi-Soms, PA-C  Amino Acids-Protein Hydrolys (FEEDING SUPPLEMENT, PRO-STAT SUGAR FREE 64,) LIQD Take 30 mLs by mouth 2 (two) times daily. Patient not taking: Reported on 08/14/2014 05/16/14   Leroy Sea, MD  polyethylene glycol (MIRALAX / Ethelene Hal) packet Take 17 g by mouth daily. Patient not taking: Reported on 09/05/2014 06/21/14   Purvis Sheffield, MD   BP 118/90 mmHg  Pulse 92  Temp(Src) 98.6 F (37 C) (Oral)  Resp 18  SpO2 99% Physical Exam  Constitutional: She is oriented to person, place, and time. She appears well-developed and well-nourished.  Non-toxic appearance. No distress.  HENT:  Head: Normocephalic and atraumatic.  Eyes: Conjunctivae, EOM and lids are normal. Pupils are equal, round, and reactive to light.  Neck: Normal range of motion. Neck supple. No tracheal deviation present. No thyroid mass present.  Cardiovascular: Normal rate, regular rhythm and normal heart sounds.  Exam reveals no gallop.   No murmur heard. Pulmonary/Chest: Effort normal and breath sounds normal. No stridor. No respiratory distress. She has no decreased breath sounds. She has no wheezes. She has no rhonchi. She has no rales.  Abdominal: Soft. Normal appearance and bowel sounds are normal. She exhibits no distension. There is no tenderness. There is no rebound and no CVA tenderness.  Musculoskeletal: Normal range of motion. She exhibits no edema or tenderness.  Neurological: She is alert and oriented to person, place, and time. She has normal strength. No cranial nerve deficit or sensory deficit. GCS eye subscore is 4. GCS verbal subscore is 5. GCS motor subscore is 6.  Skin: Skin is warm and dry. No abrasion and no rash noted.  Psychiatric: She has a normal mood and affect. Her speech is normal and behavior is normal.  Nursing note and vitals reviewed.   ED Course  Procedures (including critical care time) Labs Review Labs Reviewed  TROPONIN I  CBC WITH DIFFERENTIAL/PLATELET  COMPREHENSIVE METABOLIC PANEL  LIPASE, BLOOD  URINALYSIS, ROUTINE W REFLEX MICROSCOPIC (NOT AT [facility])    Imaging Review No results found.   EKG Interpretation   Date/Time:  Wednesday September 05 2014 16:31:41 EDT Ventricular Rate:  91 PR Interval:  149 QRS Duration: 78 QT Interval:  356 QTC Calculation: 438 R Axis:   48 Text Interpretation:  Sinus  rhythm No significant change since last  tracing Confirmed by Glynna Failla  MD, Faron Whitelock ([id number]) on 09/05/2014 6:08:15 PM      MDM   Final diagnoses:  None    Patient's urine is likely contaminated. Does have mild hyperglycemia. Patient has chronic renal insufficiency. Abdominal series shows constipation. Patient stable for discharge   Lorre Nick, MD 09/05/14 2135

## 2014-09-13 ENCOUNTER — Emergency Department (HOSPITAL_COMMUNITY): Payer: 59

## 2014-09-13 ENCOUNTER — Inpatient Hospital Stay (HOSPITAL_COMMUNITY)
Admission: EM | Admit: 2014-09-13 | Discharge: 2014-09-15 | DRG: 638 | Disposition: A | Discharge status: Skilled Nursing Facility | Payer: Self-pay | Attending: Internal Medicine | Admitting: Internal Medicine

## 2014-09-13 ENCOUNTER — Telehealth: Payer: Self-pay | Admitting: Primary Care

## 2014-09-13 ENCOUNTER — Encounter (HOSPITAL_COMMUNITY): Payer: Self-pay | Admitting: *Deleted

## 2014-09-13 DIAGNOSIS — R112 Nausea with vomiting, unspecified: Secondary | ICD-10-CM | POA: Diagnosis present

## 2014-09-13 DIAGNOSIS — N390 Urinary tract infection, site not specified: Secondary | ICD-10-CM | POA: Diagnosis present

## 2014-09-13 DIAGNOSIS — E871 Hypo-osmolality and hyponatremia: Secondary | ICD-10-CM | POA: Diagnosis present

## 2014-09-13 DIAGNOSIS — R739 Hyperglycemia, unspecified: Secondary | ICD-10-CM

## 2014-09-13 DIAGNOSIS — N183 Chronic kidney disease, stage 3 unspecified: Secondary | ICD-10-CM | POA: Diagnosis present

## 2014-09-13 DIAGNOSIS — Z885 Allergy status to narcotic agent status: Secondary | ICD-10-CM

## 2014-09-13 DIAGNOSIS — D649 Anemia, unspecified: Secondary | ICD-10-CM | POA: Diagnosis present

## 2014-09-13 DIAGNOSIS — E860 Dehydration: Secondary | ICD-10-CM | POA: Diagnosis present

## 2014-09-13 DIAGNOSIS — E118 Type 2 diabetes mellitus with unspecified complications: Secondary | ICD-10-CM

## 2014-09-13 DIAGNOSIS — R109 Unspecified abdominal pain: Secondary | ICD-10-CM | POA: Diagnosis present

## 2014-09-13 DIAGNOSIS — N19 Unspecified kidney failure: Secondary | ICD-10-CM

## 2014-09-13 DIAGNOSIS — E1110 Type 2 diabetes mellitus with ketoacidosis without coma: Secondary | ICD-10-CM | POA: Diagnosis present

## 2014-09-13 DIAGNOSIS — E1310 Other specified diabetes mellitus with ketoacidosis without coma: Principal | ICD-10-CM | POA: Diagnosis present

## 2014-09-13 DIAGNOSIS — R8290 Unspecified abnormal findings in urine: Secondary | ICD-10-CM | POA: Diagnosis present

## 2014-09-13 DIAGNOSIS — E1165 Type 2 diabetes mellitus with hyperglycemia: Secondary | ICD-10-CM | POA: Diagnosis present

## 2014-09-13 DIAGNOSIS — R197 Diarrhea, unspecified: Secondary | ICD-10-CM

## 2014-09-13 DIAGNOSIS — N179 Acute kidney failure, unspecified: Secondary | ICD-10-CM | POA: Diagnosis present

## 2014-09-13 DIAGNOSIS — Z794 Long term (current) use of insulin: Secondary | ICD-10-CM

## 2014-09-13 DIAGNOSIS — E669 Obesity, unspecified: Secondary | ICD-10-CM | POA: Diagnosis present

## 2014-09-13 DIAGNOSIS — Z89411 Acquired absence of right great toe: Secondary | ICD-10-CM

## 2014-09-13 DIAGNOSIS — Z6830 Body mass index (BMI) 30.0-30.9, adult: Secondary | ICD-10-CM

## 2014-09-13 DIAGNOSIS — R079 Chest pain, unspecified: Secondary | ICD-10-CM | POA: Diagnosis present

## 2014-09-13 DIAGNOSIS — I129 Hypertensive chronic kidney disease with stage 1 through stage 4 chronic kidney disease, or unspecified chronic kidney disease: Secondary | ICD-10-CM | POA: Diagnosis present

## 2014-09-13 DIAGNOSIS — I10 Essential (primary) hypertension: Secondary | ICD-10-CM | POA: Diagnosis present

## 2014-09-13 DIAGNOSIS — E875 Hyperkalemia: Secondary | ICD-10-CM | POA: Diagnosis present

## 2014-09-13 LAB — BASIC METABOLIC PANEL
ANION GAP: 10 (ref 5–15)
Anion gap: 7 (ref 5–15)
BUN: 32 mg/dL — AB (ref 6–20)
BUN: 25 mg/dL — ABNORMAL HIGH (ref 6–20)
CALCIUM: 8.8 mg/dL — AB (ref 8.9–10.3)
CALCIUM: 8.7 mg/dL — AB (ref 8.9–10.3)
CO2: 24 mmol/L (ref 22–32)
CO2: 27 mmol/L (ref 22–32)
Chloride: 96 mmol/L — ABNORMAL LOW (ref 101–111)
Chloride: 98 mmol/L — ABNORMAL LOW (ref 101–111)
Creatinine, Ser: 1.58 mg/dL — ABNORMAL HIGH (ref 0.44–1.00)
Creatinine, Ser: 1.27 mg/dL — ABNORMAL HIGH (ref 0.44–1.00)
GFR calc non Af Amer: 34 mL/min — ABNORMAL LOW (ref >60)
GFR calc non Af Amer: 44 mL/min — ABNORMAL LOW (ref >60)
GFR, EST AFRICAN AMERICAN: 39 mL/min — AB (ref >60)
GFR, EST AFRICAN AMERICAN: 51 mL/min — AB (ref >60)
Glucose, Bld: 493 mg/dL — ABNORMAL HIGH (ref 65–99)
Glucose, Bld: 172 mg/dL — ABNORMAL HIGH (ref 65–99)
Potassium: 4.5 mmol/L (ref 3.5–5.1)
Potassium: 3.7 mmol/L (ref 3.5–5.1)
SODIUM: 132 mmol/L — AB (ref 135–145)
Sodium: 130 mmol/L — ABNORMAL LOW (ref 135–145)

## 2014-09-13 LAB — CBG MONITORING, ED
GLUCOSE-CAPILLARY: 408 mg/dL — AB (ref 65–99)
Glucose-Capillary: >600 mg/dL (ref 65–99)
Glucose-Capillary: 533 mg/dL — ABNORMAL HIGH (ref 65–99)

## 2014-09-13 LAB — URINALYSIS, ROUTINE W REFLEX MICROSCOPIC
Bilirubin Urine: NEGATIVE
KETONES UR: 15 mg/dL — AB
NITRITE: NEGATIVE
Protein, ur: NEGATIVE mg/dL
Specific Gravity, Urine: 1.024 (ref 1.005–1.030)
Urobilinogen, UA: 0.2 mg/dL (ref 0.0–1.0)
pH: 5.5 (ref 5.0–8.0)

## 2014-09-13 LAB — COMPREHENSIVE METABOLIC PANEL
ALBUMIN: 4 g/dL (ref 3.5–5.0)
ALK PHOS: 151 U/L — AB (ref 38–126)
ALT: 14 U/L (ref 14–54)
ANION GAP: 17 — AB (ref 5–15)
AST: 17 U/L (ref 15–41)
BUN: 38 mg/dL — AB (ref 6–20)
CO2: 22 mmol/L (ref 22–32)
Calcium: 9.4 mg/dL (ref 8.9–10.3)
Chloride: 81 mmol/L — ABNORMAL LOW (ref 101–111)
Creatinine, Ser: 2.06 mg/dL — ABNORMAL HIGH (ref 0.44–1.00)
GFR calc Af Amer: 29 mL/min — ABNORMAL LOW (ref >60)
GFR calc non Af Amer: 25 mL/min — ABNORMAL LOW (ref >60)
GLUCOSE: 966 mg/dL — AB (ref 65–99)
POTASSIUM: 6.1 mmol/L — AB (ref 3.5–5.1)
Sodium: 120 mmol/L — ABNORMAL LOW (ref 135–145)
Total Bilirubin: 0.9 mg/dL (ref 0.3–1.2)
Total Protein: 7.7 g/dL (ref 6.5–8.1)

## 2014-09-13 LAB — TROPONIN I
TROPONIN I: 0.03 ng/mL (ref <0.031)
Troponin I: 0.04 ng/mL — ABNORMAL HIGH (ref <0.031)

## 2014-09-13 LAB — URINE MICROSCOPIC-ADD ON

## 2014-09-13 LAB — CBC
HCT: 36 % (ref 36.0–46.0)
HEMOGLOBIN: 12 g/dL (ref 12.0–15.0)
MCH: 26.4 pg (ref 26.0–34.0)
MCHC: 33.3 g/dL (ref 30.0–36.0)
MCV: 79.3 fL (ref 78.0–100.0)
Platelets: 208 10*3/uL (ref 150–400)
RBC: 4.54 MIL/uL (ref 3.87–5.11)
RDW: 14 % (ref 11.5–15.5)
WBC: 5.8 10*3/uL (ref 4.0–10.5)

## 2014-09-13 LAB — I-STAT VENOUS BLOOD GAS, ED
ACID-BASE EXCESS: 2 mmol/L (ref 0.0–2.0)
BICARBONATE: 29.3 meq/L — AB (ref 20.0–24.0)
O2 SAT: 34 %
PH VEN: 7.335 — AB (ref 7.250–7.300)
PO2 VEN: 22 mmHg — AB (ref 30.0–45.0)
TCO2: 31 mmol/L (ref 0–100)
pCO2, Ven: 55 mmHg — ABNORMAL HIGH (ref 45.0–50.0)

## 2014-09-13 LAB — GLUCOSE, CAPILLARY
GLUCOSE-CAPILLARY: 268 mg/dL — AB (ref 65–99)
GLUCOSE-CAPILLARY: 174 mg/dL — AB (ref 65–99)
GLUCOSE-CAPILLARY: 163 mg/dL — AB (ref 65–99)
Glucose-Capillary: 161 mg/dL — ABNORMAL HIGH (ref 65–99)
Glucose-Capillary: 147 mg/dL — ABNORMAL HIGH (ref 65–99)

## 2014-09-13 LAB — LIPASE, BLOOD: LIPASE: 27 U/L (ref 22–51)

## 2014-09-13 LAB — MRSA PCR SCREENING: MRSA by PCR: NEGATIVE

## 2014-09-13 MED ORDER — SODIUM CHLORIDE 0.9 % IV SOLN
INTRAVENOUS | Status: DC
  Administered 2014-09-13: 5.4 [IU]/h via INTRAVENOUS
  Filled 2014-09-13: qty 2.5

## 2014-09-13 MED ORDER — DEXTROSE 50 % IV SOLN
25.0000 mL | INTRAVENOUS | Status: DC | PRN
  Administered 2014-09-14: 25 mL via INTRAVENOUS
  Filled 2014-09-13: qty 50

## 2014-09-13 MED ORDER — SODIUM CHLORIDE 0.9 % IV SOLN
INTRAVENOUS | Status: DC
  Administered 2014-09-13: 19:00:00 via INTRAVENOUS

## 2014-09-13 MED ORDER — ONDANSETRON HCL 4 MG/2ML IJ SOLN: 4.0000 mg | Freq: Four times a day (QID) | INTRAMUSCULAR | Status: DC | PRN

## 2014-09-13 MED ORDER — DEXTROSE-NACL 5-0.45 % IV SOLN
INTRAVENOUS | Status: DC
  Administered 2014-09-13: 19:00:00 via INTRAVENOUS

## 2014-09-13 MED ORDER — GI COCKTAIL ~~LOC~~
30.0000 mL | Freq: Four times a day (QID) | ORAL | Status: DC | PRN
  Administered 2014-09-13 – 2014-09-14 (×3): 30 mL via ORAL
  Filled 2014-09-13 (×5): qty 30

## 2014-09-13 MED ORDER — OXYCODONE-ACETAMINOPHEN 5-325 MG PO TABS
1.0000 | ORAL_TABLET | Freq: Three times a day (TID) | ORAL | Status: DC | PRN
  Filled 2014-09-13: qty 1

## 2014-09-13 MED ORDER — SODIUM CHLORIDE 0.9 % IV SOLN
1000.0000 mL | Freq: Once | INTRAVENOUS | Status: AC
  Administered 2014-09-13: 1000 mL via INTRAVENOUS

## 2014-09-13 MED ORDER — GABAPENTIN 100 MG PO CAPS
100.0000 mg | ORAL_CAPSULE | Freq: Two times a day (BID) | ORAL | Status: DC
  Administered 2014-09-13 – 2014-09-15 (×4): 100 mg via ORAL
  Filled 2014-09-13 (×5): qty 1

## 2014-09-13 MED ORDER — ENOXAPARIN SODIUM 40 MG/0.4ML ~~LOC~~ SOLN
40.0000 mg | SUBCUTANEOUS | Status: DC
  Administered 2014-09-14: 40 mg via SUBCUTANEOUS
  Filled 2014-09-13 (×3): qty 0.4

## 2014-09-13 MED ORDER — INSULIN ASPART 100 UNIT/ML ~~LOC~~ SOLN
0.0000 [IU] | SUBCUTANEOUS | Status: DC
  Administered 2014-09-14: 1 [IU] via SUBCUTANEOUS

## 2014-09-13 MED ORDER — INSULIN REGULAR BOLUS VIA INFUSION
0.0000 [IU] | Freq: Three times a day (TID) | INTRAVENOUS | Status: DC
Start: 2014-09-14 — End: 2014-09-13
  Filled 2014-09-13: qty 10

## 2014-09-13 MED ORDER — ACETAMINOPHEN 325 MG PO TABS: 650.0000 mg | ORAL_TABLET | ORAL | Status: DC | PRN

## 2014-09-13 MED ORDER — FERROUS SULFATE 325 (65 FE) MG PO TABS
325.0000 mg | ORAL_TABLET | Freq: Every day | ORAL | Status: DC
  Administered 2014-09-14 – 2014-09-15 (×2): 325 mg via ORAL
  Filled 2014-09-13 (×3): qty 1

## 2014-09-13 MED ORDER — DEXTROSE 5 % IV SOLN
1.0000 g | INTRAVENOUS | Status: DC
  Administered 2014-09-13 – 2014-09-14 (×2): 1 g via INTRAVENOUS
  Filled 2014-09-13 (×3): qty 10

## 2014-09-13 MED ORDER — INSULIN REGULAR HUMAN 100 UNIT/ML IJ SOLN
INTRAMUSCULAR | Status: DC
  Administered 2014-09-13: 4.2 [IU]/h via INTRAVENOUS
  Filled 2014-09-13: qty 2.5

## 2014-09-13 MED ORDER — SODIUM CHLORIDE 0.9 % IV SOLN
1000.0000 mL | INTRAVENOUS | Status: DC
  Administered 2014-09-13: 1000 mL via INTRAVENOUS

## 2014-09-13 MED ORDER — PANTOPRAZOLE SODIUM 40 MG PO TBEC
40.0000 mg | DELAYED_RELEASE_TABLET | Freq: Every day | ORAL | Status: DC
  Administered 2014-09-14: 40 mg via ORAL
  Filled 2014-09-13: qty 1

## 2014-09-13 MED ORDER — INSULIN DETEMIR 100 UNIT/ML ~~LOC~~ SOLN
25.0000 [IU] | Freq: Every day | SUBCUTANEOUS | Status: DC
  Administered 2014-09-13 – 2014-09-14 (×2): 25 [IU] via SUBCUTANEOUS
  Filled 2014-09-13 (×2): qty 0.25

## 2014-09-13 MED ORDER — MORPHINE SULFATE 2 MG/ML IJ SOLN
2.0000 mg | INTRAMUSCULAR | Status: DC | PRN
  Administered 2014-09-13 – 2014-09-14 (×2): 2 mg via INTRAVENOUS
  Filled 2014-09-13 (×2): qty 1

## 2014-09-13 NOTE — Telephone Encounter (Signed)
[facility] - Client TELEPHONE ADVICE RECORD [facility] Patient Name: Rachel Gonzalez Gender: Female DOB: 29-Apr-1950 Age: 89 Y 10 M 7 D Return Phone Number: [phone] (Primary) Address: [address] [place]Zip: [place] [id number] Client [facility] Day Physician Vernona Riegerlark, Katherine Contact Type Call Call Type Triage / Clinical Relationship To Patient Self Appointment Disposition EMR Appointment Not Necessary Info pasted into Epic Yes Return Phone Number [phone] (Primary) Chief Complaint Vomiting Initial Comment caller states she has vomiting and diarrhea, has urinated PreDisposition Call Doctor Nurse Assessment Nurse: Roderic OvensNorth, RN, Amy Date/Time Lamount Cohen(Eastern Time): 09/13/2014 8:46:31 AM Confirm and document reason for call. If symptomatic, describe symptoms. ---CALLER STATES THAT SHE HAS BEEN HAVING DIARRHEA AND VOMITING FOR A COUPLE OF WEEKS. NO ABNORMAL COLOR. NO FEVER. SHE IS HAVING DIARRHEA TWICE A DAY. VOMITING 3-4 TIMES. MEDICAL CONDITIONS; DIABETES, HYPERTENSION. BS LEVELS ARE ABOUT 200-300. SHE IS ON INSULIN. SHE CANT KEEP ANYTHING ON HER STOMACH. SHE HAS PAIN IN THE UPPER STOMACH. INTERMITTENT PAIN. SHARP PAINS. Has the patient traveled out of the country within the last 30 days? ---Not Applicable Does the patient require triage? ---Yes Related visit to physician within the last 2 weeks? ---Yes Does the PT have any chronic conditions? (i.e. diabetes, asthma, etc.) ---Yes List chronic conditions. ---HYPERTENSION, DIABETES Guidelines Guideline Title Affirmed Question Affirmed Notes Nurse Date/Time Lamount Cohen(Eastern Time) Abdominal Pain - Upper Patient sounds very sick or weak to the triager [place], RN, Amy 09/13/2014 8:47:20 AM Disp. Time Lamount Cohen(Eastern Time) Disposition Final User 09/13/2014 8:23:26 AM Send To Clinical Follow Up Queue Alfonse AlpersMcAtee,  Shannon 09/13/2014 8:49:03 AM Go to ED Now (or PCP triage) Yes Roderic OvensNorth, RN, Amy PLEASE NOTE:

## 2014-09-13 NOTE — H&P (Signed)
[facility] History and Physical                                                                                    Rachel Gonzalez, is a 64 y.o. female  MRN: [id number]   DOB - [birth year]  Admit Date - 09/13/2014  Outpatient Primary MD for the patient is Morrie Sheldon, NP  Referring MD: Lynelle Doctor / ER  With History of -  Past Medical History  Diagnosis Date  . Diabetes mellitus   . Hypertension   . Anemia       Past Surgical History  Procedure Laterality Date  . Arm surgery     . Cesarean section    . I&d extremity Right 05/08/2014    Procedure: IRRIGATION AND DEBRIDEMENT FOOT;  Surgeon: Nadara Mustard, MD;  Location: [facility] OR;  Service: Orthopedics;  Laterality: Right;  . Amputation Right 05/08/2014    Procedure: AMPUTATION RAY, right great toe;  Surgeon: Nadara Mustard, MD;  Location: [facility] OR;  Service: Orthopedics;  Laterality: Right;    in for   Chief Complaint  Patient presents with  . Chest Pain  . Generalized Body Aches     HPI This is a 64 year old female patient with known diabetes on insulin, hypertension, anemia and known diabetic neuropathy who presents to the ER with chest pain radiating into the abdomen and progressive generalized weakness associated with nausea and vomiting and diarrhea. Patient has been having issues for several weeks with nonspecific abdominal pain and has undergone workups with her primary care physician or has been evaluated in the ER with no definitive cause found. Over the past week the patient really hasn't been able to eat or drink much of anything and because of this she did not take her long-acting insulin for fear of inducing hypoglycemia but she had been utilizing her short acting insulin. She also has a history of constipation and had continued using her normal stool softeners and laxatives. In regards to the chest/abdominal pain there is no real pattern to the discomfort; it doesn't particularly come on with food, exertion or rest.  She does occasionally have a sensation of bloating with onset of this pain and the pain appears to be colicky in nature and waxes and wanes in intensity and typically will have a duration of several days per occurrence. She states she is having diarrhea but when asked to clarify she is actually having loose stools. There is no blood in the stools or emesis. She reports her husband has become quite concerned she hasn't eaten or drank in anything in over 72 hours. Patient was last hospitalized in March 2016 for right lower extremity cellulitis and took antibiotics but reports she did not have any diarrhea while taking the antibiotics.  In the ER patient was afebrile, pulse was 96, BP was 143/73. Initial EKG was unremarkable and did not demonstrate evidence of acute ischemia. Troponin was normal. Acute abdominal series revealed no obstruction or free air and moderate stool throughout the colon, no acute process seen on chest x-ray. She did have significant abnormalities in her laboratory results. Her sodium was 120 in the setting of glucose 966,  potassium was 6.1 in the setting of acute renal failure with BUN 38 and creatinine 2.6 (baseline renal function BUN 19 and creatinine 1.43). She had an elevated anion gap of 17 and a mildly elevated alkaline phosphatase of 151 with normal LFTs including normal total bilirubin. CBC was normal. Urinalysis was abnormal as follows: Cloudy appearance, greater than 1000 glucose, moderate leukocytes, 30 protein, urine specific gravity 1.02, many squamous epithelials, and WBCs 11-20. Patient does report urinary symptoms of frequency and urgency. Because of DKA patient has been started on insulin infusion by the ER physician and has been given 2 L of normal saline followed by NS infusion of 125 mL per hour.   Review of Systems   In addition to the HPI above,  No Fever-chills or other constitutional symptoms No Headache, changes with Vision or hearing, new weakness, tingling,  numbness in any extremity, No problems swallowing food or Liquids, indigestion/reflux No Cough or Shortness of Breath, palpitations, orthopnea or DOE No no melena or hematochezia, no dark tarry stools, No new skin rashes, lesions, masses or bruises, No new joints pains-aches No recent weight gain or loss No polyuria, polydypsia or polyphagia,  *A full 10 point Review of Systems was done, except as stated above, all other Review of Systems were negative.  Social History History  Substance Use Topics  . Smoking status: Never Smoker   . Smokeless tobacco: Not on file  . Alcohol Use: No    Resides at: Private residence  Lives with: Husband  Ambulatory status: Without assistive devices   Family History Family History  Problem Relation Age of Onset  . Diabetes Mother      Prior to Admission medications   Medication Sig Start Date End Date Taking? Authorizing Provider  Amino Acids-Protein Hydrolys (FEEDING SUPPLEMENT, PRO-STAT SUGAR FREE 64,) LIQD Take 30 mLs by mouth 2 (two) times daily. Patient not taking: Reported on 08/14/2014 05/16/14   Leroy SeaPrashant K Singh, MD  docusate sodium (COLACE) 100 MG capsule Take 1 capsule (100 mg total) by mouth 2 (two) times daily. 05/16/14   Leroy SeaPrashant K Singh, MD  ferrous sulfate 325 (65 FE) MG tablet Take 1 tablet (325 mg total) by mouth daily with breakfast. 05/16/14   Leroy SeaPrashant K Singh, MD  gabapentin (NEURONTIN) 100 MG capsule Take 1 capsule (100 mg total) by mouth 2 (two) times daily. 07/30/14   Doreene NestKatherine K Clark, NP  hydrochlorothiazide (HYDRODIURIL) 25 MG tablet Take 1 tablet (25 mg total) by mouth daily. 07/30/14   Doreene NestKatherine K Clark, NP  insulin detemir (LEVEMIR) 100 UNIT/ML injection Inject 25 units into the skin at bedtime. 08/14/14   Doreene NestKatherine K Clark, NP  insulin lispro (HUMALOG KWIKPEN) 100 UNIT/ML KiwkPen Inject 14 units subcutaneously with lunch and dinner.  Hold if blood sugar is below 110. 07/30/14   Doreene NestKatherine K Clark, NP  lisinopril  (PRINIVIL,ZESTRIL) 2.5 MG tablet Take 1 tablet (2.5 mg total) by mouth daily. 08/14/14   Doreene NestKatherine K Clark, NP  ondansetron (ZOFRAN ODT) 4 MG disintegrating tablet Take 1 tablet (4 mg total) by mouth every 8 (eight) hours as needed for nausea or vomiting. 06/03/14   Mercedes Camprubi-Soms, PA-C  oxyCODONE-acetaminophen (PERCOCET/ROXICET) 5-325 MG per tablet Take 1 tablet by mouth every 8 (eight) hours as needed for severe pain. 05/16/14   Leroy SeaPrashant K Singh, MD  pantoprazole (PROTONIX) 40 MG tablet Take 1 tablet (40 mg total) by mouth daily at 12 noon. 05/16/14   Leroy SeaPrashant K Singh, MD  polyethylene glycol (MIRALAX / Ethelene HalGLYCOLAX)  packet Take 17 g by mouth daily. Patient not taking: Reported on 09/05/2014 06/21/14   Purvis Sheffield, MD  polyethylene glycol [facility] / Ethelene Hal) packet Take 17 g by mouth 2 (two) times daily. Take 17g PO BID until you have daily soft bowel movements, then you may decrease dose to 17g PO QD 08/31/14   Mercedes Camprubi-Soms, PA-C    Allergies  Allergen Reactions  . Tramadol Nausea And Vomiting    Physical Exam  Vitals  Blood pressure 168/77, pulse 102, temperature 98.2 F (36.8 C), temperature source Oral, resp. rate 22, SpO2 95 %.   General:  In moderate acute distress as evidenced by ongoing colicky abdominal pain while in ER  Psych:  Normal affect but somewhat anxious even current symptoms, Denies Suicidal or Homicidal ideations, Awake Alert, Oriented X 3. Speech and thought patterns are clear and appropriate, no apparent short term memory deficits  Neuro:   No focal neurological deficits, CN II through XII intact, Strength 5/5 all 4 extremities, Sensation intact all 4 extremities.  ENT:  Ears and Eyes appear Normal, Conjunctivae clear, PER. Moist oral mucosa without erythema or exudates.  Neck:  Supple, No lymphadenopathy appreciated  Respiratory:  Symmetrical chest wall movement, Good air movement bilaterally, CTAB. Room Air  Cardiac:  RRR, No Murmurs, no LE edema  noted, no JVD, No carotid bruits, peripheral pulses palpable at 2+  Abdomen:  Positive but hypoactive bowel sounds, Soft, tender upper quadrant, Non distended,  No masses appreciated, no obvious hepatosplenomegaly  Skin:  No Cyanosis, poor Skin Turgor, No Skin Rash or Bruise.  Extremities: Symmetrical without obvious trauma or injury,  no effusions.  Data Review  CBC  Recent Labs Lab 09/13/14 1212  WBC 5.8  HGB 12.0  HCT 36.0  PLT 208  MCV 79.3  MCH 26.4  MCHC 33.3  RDW 14.0    Chemistries   Recent Labs Lab 09/13/14 1212  NA 120*  K 6.1*  CL 81*  CO2 22  GLUCOSE 966*  BUN 38*  CREATININE 2.06*  CALCIUM 9.4  AST 17  ALT 14  ALKPHOS 151*  BILITOT 0.9    CrCl cannot be calculated (Unknown ideal weight.).  No results for input(s): TSH, T4TOTAL, T3FREE, THYROIDAB in the last 72 hours.  Invalid input(s): FREET3  Coagulation profile No results for input(s): INR, PROTIME in the last 168 hours.  No results for input(s): DDIMER in the last 72 hours.  Cardiac Enzymes  Recent Labs Lab 09/13/14 1212  TROPONINI 0.03    Invalid input(s): POCBNP  Urinalysis    Component Value Date/Time   COLORURINE YELLOW 09/05/2014 1954   APPEARANCEUR CLOUDY* 09/05/2014 1954   LABSPEC 1.020 09/05/2014 1954   PHURINE 6.5 09/05/2014 1954   GLUCOSEU >1000* 09/05/2014 1954   HGBUR TRACE* 09/05/2014 1954   BILIRUBINUR NEGATIVE 09/05/2014 1954   KETONESUR NEGATIVE 09/05/2014 1954   PROTEINUR 30* 09/05/2014 1954   UROBILINOGEN 0.2 09/05/2014 1954   NITRITE NEGATIVE 09/05/2014 1954   LEUKOCYTESUR MODERATE* 09/05/2014 1954    Imaging results:   Dg Chest 2 View  09/13/2014   CLINICAL DATA:  Chest pain  EXAM: CHEST  2 VIEW  COMPARISON:  09/05/2014  FINDINGS: The heart size and mediastinal contours are within normal limits. Both lungs are clear. The visualized skeletal structures are unremarkable.  IMPRESSION: No active cardiopulmonary disease.   Electronically Signed   By:  Marlan Palau M.D.   On: 09/13/2014 13:20   Dg Chest 2 View  08/30/2014  CLINICAL DATA:  Chest pain.  EXAM: CHEST  2 VIEW  COMPARISON:  May 06, 2014.  FINDINGS: The heart size and mediastinal contours are within normal limits. Both lungs are clear. No pneumothorax or pleural effusion is noted. The visualized skeletal structures are unremarkable.  IMPRESSION: No active cardiopulmonary disease.   Electronically Signed   By: Lupita Raider, M.D.   On: 08/30/2014 16:50   Ct Abdomen Pelvis W Contrast  08/30/2014   CLINICAL DATA:  Left-sided abdominal pain.  EXAM: CT ABDOMEN AND PELVIS WITH CONTRAST  TECHNIQUE: Multidetector CT imaging of the abdomen and pelvis was performed using the standard protocol following bolus administration of intravenous contrast.  CONTRAST:  OMNIPAQUE IOHEXOL 300 MG/ML  SOLN  COMPARISON:  CT scan of June 18, 2010.  FINDINGS: Visualized lung bases appear normal. No significant osseous abnormality is noted.  No gallstones are noted. The liver, spleen and pancreas appear normal. Adrenal glands and kidneys appear normal. No hydronephrosis or renal obstruction is noted. No renal or ureteral calculi are noted. The appendix appears normal. There is no evidence of bowel obstruction. Stool is noted throughout the colon. No abnormal fluid collection is noted. Uterus and ovaries appear normal. Urinary bladder appears normal. No significant adenopathy is noted.  IMPRESSION: No definite abnormality seen in the abdomen or pelvis.   Electronically Signed   By: Lupita Raider, M.D.   On: 08/30/2014 22:51   Dg Abd Acute W/chest  09/13/2014   CLINICAL DATA:  Three-day history of abdominal pain with diarrhea and emesis  EXAM: DG ABDOMEN ACUTE W/ 1V CHEST  COMPARISON:  Abdomen series September 05, 2014; chest radiograph September 13, 2014  FINDINGS: PA chest: There is no edema or consolidation. The heart size and pulmonary vascularity are normal. No adenopathy.  Supine and upright abdomen: There is  moderate stool throughout colon. There is no bowel dilatation or air-fluid level suggesting obstruction. No free air. There are occasional foci of vascular calcification in the pelvis bilaterally.  IMPRESSION: Bowel gas pattern unremarkable. No obstruction or free air. Moderate stool throughout colon. No lung edema or consolidation.   Electronically Signed   By: Bretta Bang III M.D.   On: 09/13/2014 14:09   Dg Abd Acute W/chest  09/05/2014   CLINICAL DATA:  Left-sided abdominal pain and low back pain.  EXAM: DG ABDOMEN ACUTE W/ 1V CHEST  COMPARISON:  CT of 08/30/2014.  Chest radiograph 08/30/2014.  FINDINGS: Frontal view of the chest demonstrates midline trachea. Normal heart size and mediastinal contours. No pleural effusion or pneumothorax. Clear lungs.  Abdominal films demonstrate no free intraperitoneal air or significant air-fluid levels on upright positioning. No bowel distention on supine imaging. Moderate amount of stool throughout the colon. No abnormal abdominal calcifications. No appendicolith. Distal gas and stool.  IMPRESSION: No acute findings.  Possible constipation.   Electronically Signed   By: Jeronimo Greaves M.D.   On: 09/05/2014 19:24     EKG: (Independently reviewed) sinus tachycardia right axis deviation and no acute ischemic changes   Assessment & Plan  Principal Problem:   DKA /  Diabetes mellitus type 2, uncontrolled -Admit to stepdown -Continue insulin infusion -Suspect precipitated by nonadherence to usual insulin regimen and possibly exacerbated by underlying acute processes of presumed UTI, acute renal failure, and abdominal pain of uncertain etiology -NPO -Check hemoglobin A1c-hemoglobin A1c 6/7 was 12.7 but this has improved from hemoglobin A1c of 16.2 on 2/28 -Follow BMET every 4 hours with repeat now  Active Problems:   Dehydration with hyponatremia -Multifactorial etiology: Likely component of volume depletion further exacerbated by pseudohyponatremia in  setting of DKA as well as expected hyponatremia in acute renal failure -Treat underlying causes -Follow labs as above    Acute hyperkalemia/Acute renal failure superimposed on stage 3 chronic kidney disease -Seems prerenal and precipitated by preadmission symptoms of nausea, vomiting and loose stools as well as further exacerbated by DKA and hyperglycemic volume loss -May have a degree of ATN since was also on HydroDiuril as well as lisinopril prior to admission-holding these medications for now -Follow labs as above -Suspect has a degree of diabetic nephropathy contributing to CKD    Abnormal urinalysis -Potentially has urinary tract infection so we'll check urine culture and begin empiric Rocephin -Likely abnormal urinalysis reflective of volume depletion -Since symptoms have been ongoing for greater than one week and associated with abdominal pain check blood cultures    Chest pain/Abdominal pain, recurrent -Chest pain does not seem cardiac in nature but we'll cycle cardiac enzymes; had echocardiogram in March 2016 and no regional wall motion abnormalities identified-current EKG normal -Suspect chest pain and abdominal pain coming from gastroparesis in a patient with poorly controlled diabetes -May benefit from gastric emptying study this admission- hold off on Reglan until diagnosis confirmed or at least until diagnostic testing completed -Did have finding of focal left upper quadrant pain on exam so we'll check lipase; not on Invokana or other diabetic agents that can precipitate acute pancreatitis -Abdominal portion of x-rays did reveal moderate constipation in colon    Nausea vomiting and diarrhea -Appears to have multifactorial etiologies as well: Some of the nausea and vomiting initially from gastroparesis which has been further precipitated by DKA as well as from electrolyte abnormalities in setting of acute renal failure -Loose stool/diarrhea could be from possible UTI but also  likely exacerbated by ongoing use of stool softeners and laxatives which are now on hold -Check C. difficile PCR as precaution    Essential hypertension -Moderate control of blood pressure time of admission -Thiazide diuretics and ACE inhibitor on hold due to acute renal failure and dehydration -Follow closely-commend utilizing when necessary hydralazine IV for uncontrolled hypertension    Anemia -Baseline hemoglobin appears to be between 10.9 and 11.2 -Current hemoglobin 12 and reflective of hemoconcentration setting of dehydration     DVT Prophylaxis: Lovenox  Family Communication:   No family at bedside  Code Status:  Full code  Condition:  Guarded  Discharge disposition: Anticipate discharge back home after resolution of DKA and acute renal failure as well as resolution or improvement in her GI symptoms       ELLIS,ALLISON L. ANP on 09/13/2014 at 3:18 PM  Between 7am to 7pm - Pager - [phone]  After 7pm go to [email or web address] - password [email or web address]  And look for the night coverage person covering me after hours  [facility]  Patient was seen, examined,treatment plan was discussed with the Advance Practice Provider.  I have directly reviewed the clinical findings, lab, imaging studies and management of this patient in detail. I have made the necessary changes to the above noted documentation, and agree with the documentation, as recorded by the Advance Practice Provider.   Pamella Pert, MD [facility] [phone]

## 2014-09-13 NOTE — ED Notes (Signed)
Critical lab values called to Dr. Lynelle DoctorKnapp.

## 2014-09-13 NOTE — Progress Notes (Signed)
Patient transferred to 2C18 from ED. Patient denies any pain or discomfort at this time.

## 2014-09-13 NOTE — ED Provider Notes (Signed)
CSN: [id number]     Arrival date & time 09/13/14  1142 History   First MD Initiated Contact with Patient 09/13/14 1309     Chief Complaint  Patient presents with  . Chest Pain  . Generalized Body Aches    Patient is a 64 y.o. female presenting with chest pain.  Chest Pain  Pt has been having trouble with generalized body aches, chest and abd pain  for a while now (prior ED visits recently, negative abdominal CT scan) but on Sunday she started having trouble with general weakness.  She has been having diffuse body aches.  SHe started having vomiting and diarrhea on Tuesday.  She has had maybe 5 episodes per day.  Similar amounts of vomiting.  She stopped taking her insulin on Monday because she was feeling sick and not eating well.  The last time she checked her sugar it was 350.  SHe was able to keep down some apple juice.  She is having pain that is sharp from her chest down to her abdomen.  That has been ongoing for at least a few weeks. Past Medical History  Diagnosis Date  . Diabetes mellitus   . Hypertension   . Anemia    Past Surgical History  Procedure Laterality Date  . Arm surgery     . Cesarean section    . I&d extremity Right 05/08/2014    Procedure: IRRIGATION AND DEBRIDEMENT FOOT;  Surgeon: Nadara MustardMarcus Duda V, MD;  Location: [facility] OR;  Service: Orthopedics;  Laterality: Right;  . Amputation Right 05/08/2014    Procedure: AMPUTATION RAY, right great toe;  Surgeon: Nadara MustardMarcus Duda V, MD;  Location: [facility] OR;  Service: Orthopedics;  Laterality: Right;   Family History  Problem Relation Age of Onset  . Diabetes Mother    History  Substance Use Topics  . Smoking status: Never Smoker   . Smokeless tobacco: Not on file  . Alcohol Use: No   OB History    No data available     Review of Systems  Cardiovascular: Positive for chest pain.  All other systems reviewed and are negative.     Allergies  Tramadol  Home Medications   Prior to Admission medications   Medication Sig Start  Date End Date Taking? Authorizing Provider  Amino Acids-Protein Hydrolys (FEEDING SUPPLEMENT, PRO-STAT SUGAR FREE 64,) LIQD Take 30 mLs by mouth 2 (two) times daily. Patient not taking: Reported on 08/14/2014 05/16/14   Leroy SeaPrashant K Singh, MD  docusate sodium (COLACE) 100 MG capsule Take 1 capsule (100 mg total) by mouth 2 (two) times daily. 05/16/14   Leroy SeaPrashant K Singh, MD  ferrous sulfate 325 (65 FE) MG tablet Take 1 tablet (325 mg total) by mouth daily with breakfast. 05/16/14   Leroy SeaPrashant K Singh, MD  gabapentin (NEURONTIN) 100 MG capsule Take 1 capsule (100 mg total) by mouth 2 (two) times daily. 07/30/14   Doreene NestKatherine K Clark, NP  hydrochlorothiazide (HYDRODIURIL) 25 MG tablet Take 1 tablet (25 mg total) by mouth daily. 07/30/14   Doreene NestKatherine K Clark, NP  insulin detemir (LEVEMIR) 100 UNIT/ML injection Inject 25 units into the skin at bedtime. 08/14/14   Doreene NestKatherine K Clark, NP  insulin lispro (HUMALOG KWIKPEN) 100 UNIT/ML KiwkPen Inject 14 units subcutaneously with lunch and dinner.  Hold if blood sugar is below 110. 07/30/14   Doreene NestKatherine K Clark, NP  lisinopril (PRINIVIL,ZESTRIL) 2.5 MG tablet Take 1 tablet (2.5 mg total) by mouth daily. 08/14/14   Doreene NestKatherine K Clark, NP  ondansetron (ZOFRAN ODT) 4 MG disintegrating tablet Take 1 tablet (4 mg total) by mouth every 8 (eight) hours as needed for nausea or vomiting. 06/03/14   Mercedes Camprubi-Soms, PA-C  oxyCODONE-acetaminophen (PERCOCET/ROXICET) 5-325 MG per tablet Take 1 tablet by mouth every 8 (eight) hours as needed for severe pain. 05/16/14   Leroy Sea, MD  pantoprazole (PROTONIX) 40 MG tablet Take 1 tablet (40 mg total) by mouth daily at 12 noon. 05/16/14   Leroy Sea, MD  polyethylene glycol (MIRALAX / GLYCOLAX) packet Take 17 g by mouth daily. Patient not taking: Reported on 09/05/2014 06/21/14   Purvis Sheffield, MD  polyethylene glycol [facility] / Ethelene Hal) packet Take 17 g by mouth 2 (two) times daily. Take 17g PO BID until you have daily soft bowel  movements, then you may decrease dose to 17g PO QD 08/31/14   Mercedes Camprubi-Soms, PA-C   BP 170/87 mmHg  Pulse 95  Temp(Src) 98.2 F (36.8 C) (Oral)  Resp 17  SpO2 97% Physical Exam  Constitutional: She appears well-developed and well-nourished. No distress.  HENT:  Head: Normocephalic and atraumatic.  Right Ear: External ear normal.  Left Ear: External ear normal.  Eyes: Conjunctivae are normal. Right eye exhibits no discharge. Left eye exhibits no discharge. No scleral icterus.  Neck: Neck supple. No tracheal deviation present.  Cardiovascular: Normal rate, regular rhythm and intact distal pulses.   Pulmonary/Chest: Effort normal and breath sounds normal. No stridor. No respiratory distress. She has no wheezes. She has no rales.  Abdominal: Soft. Bowel sounds are normal. She exhibits no distension. There is no tenderness. There is no rebound and no guarding.  Musculoskeletal: She exhibits no edema or tenderness.  Neurological: She is alert. She has normal strength. No cranial nerve deficit (no facial droop, extraocular movements intact, no slurred speech) or sensory deficit. She exhibits normal muscle tone. She displays no seizure activity. Coordination normal.  Skin: Skin is warm and dry. No rash noted.  Psychiatric: She has a normal mood and affect.  Nursing note and vitals reviewed.   ED Course  Procedures (including critical care time) CRITICAL CARE Performed by: FAOZH,YQM Total critical care time: 35 Critical care time was exclusive of separately billable procedures and treating other patients. Critical care was necessary to treat or prevent imminent or life-threatening deterioration. Critical care was time spent personally by me on the following activities: development of treatment plan with patient and/or surrogate as well as nursing, discussions with consultants, evaluation of patient's response to treatment, examination of patient, obtaining history from patient or  surrogate, ordering and performing treatments and interventions, ordering and review of laboratory studies, ordering and review of radiographic studies, pulse oximetry and re-evaluation of patient's condition.  Labs Review Labs Reviewed  COMPREHENSIVE METABOLIC PANEL - Abnormal; Notable for the following:    Sodium 120 (*)    Potassium 6.1 (*)    Chloride 81 (*)    Glucose, Bld 966 (*)    BUN 38 (*)    Creatinine, Ser 2.06 (*)    Alkaline Phosphatase 151 (*)    GFR calc non Af Amer 25 (*)    GFR calc Af Amer 29 (*)    Anion gap 17 (*)    All other components within normal limits  CBC  TROPONIN I  URINALYSIS, ROUTINE W REFLEX MICROSCOPIC (NOT AT [facility])  I-STAT VENOUS BLOOD GAS, ED    Imaging Review Dg Chest 2 View  09/13/2014   CLINICAL DATA:  Chest pain  EXAM: CHEST  2 VIEW  COMPARISON:  09/05/2014  FINDINGS: The heart size and mediastinal contours are within normal limits. Both lungs are clear. The visualized skeletal structures are unremarkable.  IMPRESSION: No active cardiopulmonary disease.   Electronically Signed   By: Marlan Palau M.D.   On: 09/13/2014 13:20     EKG Interpretation   Date/Time:  Thursday September 13 2014 12:13:02 EDT Ventricular Rate:  103 PR Interval:  148 QRS Duration: 76 QT Interval:  342 QTC Calculation: 448 R Axis:   102 Text Interpretation:  Sinus tachycardia Right atrial enlargement Rightward  axis Borderline ECG Sinus tachycardia T wave abnormality Abnormal ekg  Confirmed by Gerhard Munch  MD [phone]) on 09/13/2014 12:16:01 PM     Medications  insulin regular (NOVOLIN R,HUMULIN R) 250 Units in sodium chloride 0.9 % 250 mL (1 Units/mL) infusion (not administered)  0.9 %  sodium chloride infusion (1,000 mLs Intravenous New Bag/Given 09/13/14 1333)    Followed by  0.9 %  sodium chloride infusion (not administered)    Followed by  0.9 %  sodium chloride infusion (1,000 mLs Intravenous New Bag/Given 09/13/14 1333)    MDM   Final diagnoses:   Hyperglycemia  Renal failure  Hyperkalemia    The patient has extreme hyperglycemia associated with hyponatremia hypochloremia and hyperkalemia.  We'll check a venous blood gas but her bicarbonate is normal arguing against acidosis. Patient does have an elevated BUN and creatinine this renal dysfunction may be contributing to her hyperkalemia.  Will plan on lab recheck after fluid and insulin treatment.  Plan on admission for further treatment.    Linwood Dibbles, MD 09/13/14 [phone]

## 2014-09-13 NOTE — ED Notes (Addendum)
Pt was tx here 2 weeks prior for same s/s.  States "it feels like something's catching under my L breast when I take a deep breath".  Pt also says her whole body hurts and can't give more details.  Pt now states she has diarrhea and emesis.  Took nausea meds today with no relief.

## 2014-09-13 NOTE — Progress Notes (Addendum)
RN paged this provider regarding closed anion gap (10). Pt's last CBG was 161. Pt asymptomatic per report.   Plan: BMP: STAT Re-order home Levemir 25 units subq qhs, give now (2hrs prior to infusion cessation per protocol) Diet: Full Liquid (Advance as tolerated) [facility] 2nd CBG in 2 hrs to discuss insulin infusion termination   Beau FannyWithrow, Mikeria Valin C, [place]NP 09/13/2014 09:14 PM   ADDENDUM: Pt CBG is now 147. Will stop insulin infusion. Started sliding scale and CBG's as below per protocol.  Plan: -Stop insulin infusion -Sliding scale Insulin (Novolog) Sensitive q4h with CBG  Beau FannyWithrow, Daelyn Mozer C, FNP 09/13/2014   11:59 PM

## 2014-09-13 NOTE — ED Notes (Signed)
  CBG 533

## 2014-09-13 NOTE — ED Notes (Signed)
Attempted report 

## 2014-09-13 NOTE — Telephone Encounter (Signed)
PLEASE NOTE: All timestamps contained within this report are represented as [place] Standard Time. CONFIDENTIALTY NOTICE: This fax transmission is intended only for the addressee. It contains information that is legally privileged, confidential or otherwise protected from use or disclosure. If you are not the intended recipient, you are strictly prohibited from reviewing, disclosing, copying using or disseminating any of this information or taking any action in reliance on or regarding this information. If you have received this fax in error, please notify us immediately by telephone so that we can arrange for its return to us. Phone: [phone], Toll-Free: [phone], Fax: [phone] Page: 1 of 2 Call Id: [id number] [facility] Client TELEPHONE ADVICE RECORD [facility] Patient Name: Rachel Gonzalez Gender: Female DOB: 1950-09-14 Age: [age] Y 10 M 7 D Return Phone Number: [phone] (Primary) Address: [address] [place]Zip: [place] [address] Client [facility] - Day Physician Vernona Riegerlark, Katherine Contact Type Call Call Type Triage / Clinical Relationship To Patient Self Appointment Disposition EMR Appointment Not Necessary Info pasted into Epic Yes Return Phone Number [phone] (Primary) Chief Complaint Vomiting Initial Comment caller states she has vomiting and diarrhea, has urinated PreDisposition Call Doctor Nurse Assessment Nurse: Roderic OvensNorth, RN, Amy Date/Time Lamount Cohen(Eastern Time): 09/13/2014 8:46:31 AM Confirm and document reason for call. If symptomatic, describe symptoms. ---CALLER STATES THAT SHE HAS BEEN HAVING DIARRHEA AND VOMITING FOR A COUPLE OF WEEKS. NO ABNORMAL COLOR. NO FEVER. SHE IS HAVING DIARRHEA TWICE A DAY. VOMITING 3-4 TIMES. MEDICAL CONDITIONS; DIABETES, HYPERTENSION. BS LEVELS ARE ABOUT 200-300. SHE IS ON INSULIN. SHE CAN'T KEEP  ANYTHING ON HER STOMACH. SHE HAS PAIN IN THE UPPER STOMACH. INTERMITTENT PAIN. SHARP PAINS. Has the patient traveled out of the country within the last 30 days? ---Not Applicable Does the patient require triage? ---Yes Related visit to physician within the last 2 weeks? ---Yes Does the PT have any chronic conditions? (i.e. diabetes, asthma, etc.) ---Yes List chronic conditions. ---HYPERTENSION, DIABETES Guidelines Guideline Title Affirmed Question Affirmed Notes Nurse Date/Time Lamount Cohen(Eastern Time) Abdominal Pain - Upper Patient sounds very sick or weak to the triager Florence[place], RN, Amy 09/13/2014 8:47:20 AM Disp. Time Lamount Cohen(Eastern Time) Disposition Final User 09/13/2014 8:23:26 AM Send To Clinical Follow Up Queue Alfonse AlpersMcAtee, Shannon 09/13/2014 8:49:03 AM Go to ED Now (or PCP triage) Yes Roderic OvensNorth, RN, Amy PLEASE NOTE: All timestamps contained within this report are represented as [place] Standard Time. CONFIDENTIALTY NOTICE: This fax transmission is intended only for the addressee. It contains information that is legally privileged, confidential or otherwise protected from use or disclosure. If you are not the intended recipient, you are strictly prohibited from reviewing, disclosing, copying using or disseminating any of this information or taking any action in reliance on or regarding this information. If you have received this fax in error, please notify us immediately by telephone so that we can arrange for its return to us. Phone: [phone], Toll-Free: [phone], Fax: [phone] Page: 2 of 2 Call Id: [id number] Caller Understands: Yes Disagree/Comply: Comply Care Advice Given Per Guideline GO TO ED NOW (OR PCP TRIAGE): CARE ADVICE given per Abdominal Pain, Upper (Adult) guideline. After Care Instructions Given Call Event Type User Date / Time Description [facility]

## 2014-09-14 DIAGNOSIS — E875 Hyperkalemia: Secondary | ICD-10-CM

## 2014-09-14 LAB — GLUCOSE, CAPILLARY
GLUCOSE-CAPILLARY: 130 mg/dL — AB (ref 65–99)
GLUCOSE-CAPILLARY: 100 mg/dL — AB (ref 65–99)
GLUCOSE-CAPILLARY: 169 mg/dL — AB (ref 65–99)
GLUCOSE-CAPILLARY: 310 mg/dL — AB (ref 65–99)
GLUCOSE-CAPILLARY: 243 mg/dL — AB (ref 65–99)
Glucose-Capillary: 130 mg/dL — ABNORMAL HIGH (ref 65–99)
Glucose-Capillary: 48 mg/dL — ABNORMAL LOW (ref 65–99)
Glucose-Capillary: 97 mg/dL (ref 65–99)

## 2014-09-14 LAB — URINE CULTURE

## 2014-09-14 LAB — TROPONIN I: TROPONIN I: 0.03 ng/mL (ref <0.031)

## 2014-09-14 MED ORDER — PANTOPRAZOLE SODIUM 40 MG PO TBEC
40.0000 mg | DELAYED_RELEASE_TABLET | Freq: Two times a day (BID) | ORAL | Status: DC
  Administered 2014-09-14 – 2014-09-15 (×2): 40 mg via ORAL
  Filled 2014-09-14: qty 1

## 2014-09-14 MED ORDER — DOCUSATE SODIUM 100 MG PO CAPS
200.0000 mg | ORAL_CAPSULE | Freq: Two times a day (BID) | ORAL | Status: DC
  Administered 2014-09-14 – 2014-09-15 (×3): 200 mg via ORAL
  Filled 2014-09-14 (×4): qty 2

## 2014-09-14 MED ORDER — INSULIN ASPART 100 UNIT/ML ~~LOC~~ SOLN
0.0000 [IU] | Freq: Every day | SUBCUTANEOUS | Status: DC
  Administered 2014-09-14: 2 [IU] via SUBCUTANEOUS

## 2014-09-14 MED ORDER — INSULIN ASPART 100 UNIT/ML ~~LOC~~ SOLN
0.0000 [IU] | Freq: Three times a day (TID) | SUBCUTANEOUS | Status: DC
  Administered 2014-09-14: 11 [IU] via SUBCUTANEOUS
  Administered 2014-09-14 – 2014-09-15 (×2): 3 [IU] via SUBCUTANEOUS
  Administered 2014-09-15: 5 [IU] via SUBCUTANEOUS

## 2014-09-14 MED ORDER — POLYETHYLENE GLYCOL 3350 17 G PO PACK
17.0000 g | PACK | Freq: Two times a day (BID) | ORAL | Status: DC
  Administered 2014-09-14 – 2014-09-15 (×2): 17 g via ORAL
  Filled 2014-09-14 (×4): qty 1

## 2014-09-14 MED ORDER — SODIUM CHLORIDE 0.9 % IV SOLN
1000.0000 mL | INTRAVENOUS | Status: DC
  Administered 2014-09-14 (×2): 1000 mL via INTRAVENOUS

## 2014-09-14 NOTE — Progress Notes (Signed)
Patient Demographics:    Rachel Gonzalez, is a 64 y.o. female, DOB - [birth year], ZOX:[id number]  Admit date - 09/13/2014   Admitting Physician Costin Otelia Sergeant, MD  Outpatient Primary MD for the patient is Morrie Sheldon, NP  LOS - 1   Chief Complaint  Patient presents with  . Chest Pain  . Generalized Body Aches        Subjective:    Rachel Gonzalez today has, No headache, No chest pain, No abdominal pain - No Nausea, No new weakness tingling or numbness, No Cough - SOB.     Assessment  & Plan :    Principal Problem:   DKA (diabetic ketoacidoses) Active Problems:   Diabetes mellitus type 2, uncontrolled   Essential hypertension   Anemia   Obesity (BMI 30-39.9)   Abnormal urinalysis   Chest pain   Abdominal pain, recurrent   Nausea vomiting and diarrhea   Dehydration with hyponatremia   Acute hyperkalemia   Acute renal failure superimposed on stage 3 chronic kidney disease   1. Nausea vomiting and nonspecific epigastric abdominal pain. Much improved now on PPI, KUB unremarkable, lipase normal, abdominal exam benign. If reoccurs will involve GI and consider gastric emptying study. Advance diet.   2. DKA in a type II diabetic patient to stop taking insulin for a week due to #1 above. DKA resolved, off insulin drip, gentle hydration, transitioned to Lantus and sliding scale. Monitor CBGs.   CBG (last 3)   Recent Labs  09/14/14 0512 09/14/14 0623 09/14/14 0809  GLUCAP 130* 100* 97     Lab Results  Component Value Date   HGBA1C 12.7 Repeated and verified X2.* 08/14/2014    3. 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 Due to DKA. Creatinine now back to baseline, hyperkalemia resolved with hydration. Monitor.    4. Possible UTI. Place on Rocephin for 3 days.    Code Status :  Full  Family Communication  : None  Disposition Plan  : Home 1-2 days  Consults  :  None  Procedures  : None  DVT Prophylaxis  :  Lovenox   Lab Results  Component Value Date   PLT 208 09/13/2014    Inpatient Medications  Scheduled Meds: . cefTRIAXone (ROCEPHIN)  IV  1 g Intravenous Q24H  . docusate sodium  200 mg Oral BID  . enoxaparin (LOVENOX) injection  40 mg Subcutaneous Q24H  . ferrous sulfate  325 mg Oral Q breakfast  . gabapentin  100 mg Oral BID  . insulin aspart  0-15 Units Subcutaneous TID WC  . insulin aspart  0-5 Units Subcutaneous QHS  . insulin detemir  25 Units Subcutaneous QHS  . pantoprazole  40 mg Oral BID  . polyethylene glycol  17 g Oral BID   Continuous Infusions: . sodium chloride 1,000 mL (09/14/14 0903)   PRN Meds:.acetaminophen, dextrose, gi cocktail, morphine injection, ondansetron (ZOFRAN) IV, oxyCODONE-acetaminophen  Antibiotics  :   Anti-infectives    Start     Dose/Rate Route Frequency Ordered Stop   09/13/14 1500  cefTRIAXone (ROCEPHIN) 1 g in dextrose 5 % 50 mL IVPB     1 g 100 mL/hr over 30 Minutes Intravenous Every 24 hours 09/13/14 1455  Objective:   Filed Vitals:   09/14/14 0503 09/14/14 0600 09/14/14 0810 09/14/14 1228  BP:  134/76 109/60 118/67  Pulse:  90 85   Temp: 97.9 F (36.6 C)  98.3 F (36.8 C) 97.4 F (36.3 C)  TempSrc: Oral  Axillary Oral  Resp:  26 12 15   Height:      Weight:      SpO2:  98% 96%     Wt Readings from Last 3 Encounters:  09/13/14 75.66 kg (166 lb 12.8 oz)  08/14/14 81.375 kg (179 lb 6.4 oz)  07/30/14 81.557 kg (179 lb 12.8 oz)     Intake/Output Summary (Last 24 hours) at 09/14/14 1237 Last data filed at 09/14/14 0400  Gross per 24 hour  Intake 3037.24 ml  Output    300 ml  Net 2737.24 ml     Physical Exam  Awake Alert, Oriented X 3, No new F.N deficits, Normal affect .AT,PERRAL Supple Neck,No JVD, No cervical lymphadenopathy appriciated.  Symmetrical Chest wall  movement, Good air movement bilaterally, CTAB RRR,No Gallops,Rubs or new Murmurs, No Parasternal Heave +ve B.Sounds, Abd Soft, No tenderness, No organomegaly appriciated, No rebound - guarding or rigidity. No Cyanosis, Clubbing or edema, No new Rash or bruise      Data Review:   Micro Results Recent Results (from the past 240 hour(s))  Urine culture     Status: None   Collection Time: 09/13/14  3:04 PM  Result Value Ref Range Status   Specimen Description URINE, CLEAN CATCH  Final   Special Requests NONE  Final   Culture   Final    MULTIPLE SPECIES PRESENT, SUGGEST RECOLLECTION IF CLINICALLY INDICATED   Report Status 09/14/2014 FINAL  Final  MRSA PCR Screening     Status: None   Collection Time: 09/13/14  6:07 PM  Result Value Ref Range Status   MRSA by PCR NEGATIVE NEGATIVE Final    Comment:        The GeneXpert MRSA Assay (FDA approved for NASAL specimens only), is one component of a comprehensive MRSA colonization surveillance program. It is not intended to diagnose MRSA infection nor to guide or monitor treatment for MRSA infections.     Radiology Reports Dg Chest 2 View  09/13/2014   CLINICAL DATA:  Chest pain  EXAM: CHEST  2 VIEW  COMPARISON:  09/05/2014  FINDINGS: The heart size and mediastinal contours are within normal limits. Both lungs are clear. The visualized skeletal structures are unremarkable.  IMPRESSION: No active cardiopulmonary disease.   Electronically Signed   By: Marlan Palauharles  Clark M.D.   On: 09/13/2014 13:20   Dg Abd Acute W/chest  09/13/2014   CLINICAL DATA:  Three-day history of abdominal pain with diarrhea and emesis  EXAM: DG ABDOMEN ACUTE W/ 1V CHEST  COMPARISON:  Abdomen series September 05, 2014; chest radiograph September 13, 2014  FINDINGS: PA chest: There is no edema or consolidation. The heart size and pulmonary vascularity are normal. No adenopathy.  Supine and upright abdomen: There is moderate stool throughout colon. There is no bowel dilatation or  air-fluid level suggesting obstruction. No free air. There are occasional foci of vascular calcification in the pelvis bilaterally.  IMPRESSION: Bowel gas pattern unremarkable. No obstruction or free air. Moderate stool throughout colon. No lung edema or consolidation.   Electronically Signed   By: Bretta BangWilliam  Woodruff III M.D.   On: 09/13/2014 14:09       CBC  Recent Labs Lab 09/13/14 1212  WBC 5.8  HGB 12.0  HCT 36.0  PLT 208  MCV 79.3  MCH 26.4  MCHC 33.3  RDW 14.0    Chemistries   Recent Labs Lab 09/13/14 1212 09/13/14 1630 09/13/14 2210  NA 120* 130* 132*  K 6.1* 4.5 3.7  CL 81* 96* 98*  CO2 22 24 27   GLUCOSE 966* 493* 172*  BUN 38* 32* 25*  CREATININE 2.06* 1.58* 1.27*  CALCIUM 9.4 8.8* 8.7*  AST 17  --   --   ALT 14  --   --   ALKPHOS 151*  --   --   BILITOT 0.9  --   --    ------------------------------------------------------------------------------------------------------------------ estimated creatinine clearance is 43.7 mL/min (by C-G formula based on Cr of 1.27). ------------------------------------------------------------------------------------------------------------------ No results for input(s): HGBA1C in the last 72 hours. ------------------------------------------------------------------------------------------------------------------ No results for input(s): CHOL, HDL, LDLCALC, TRIG, CHOLHDL, LDLDIRECT in the last 72 hours. ------------------------------------------------------------------------------------------------------------------ No results for input(s): TSH, T4TOTAL, T3FREE, THYROIDAB in the last 72 hours.  Invalid input(s): FREET3 ------------------------------------------------------------------------------------------------------------------ No results for input(s): VITAMINB12, FOLATE, FERRITIN, TIBC, IRON, RETICCTPCT in the last 72 hours.  Coagulation profile No results for input(s): INR, PROTIME in the last 168 hours.  No results  for input(s): DDIMER in the last 72 hours.  Cardiac Enzymes  Recent Labs Lab 09/13/14 1212 09/13/14 2210 09/14/14 0350  TROPONINI 0.03 0.04* 0.03   ------------------------------------------------------------------------------------------------------------------ Invalid input(s): POCBNP   Time Spent in minutes  35   SINGH,PRASHANT K M.D on 09/14/2014 at 12:37 PM  Between 7am to 7pm - Pager - [phone]  After 7pm go to [email or web address] - password [facility]  [facility]   Office  [phone]

## 2014-09-14 NOTE — Progress Notes (Signed)
Utilization Review completed. Emilynn Srinivasan RN BSN CM 

## 2014-09-14 NOTE — Progress Notes (Addendum)
Hypoglycemic Event  CBG: 48  Treatment: 50% dextrose 25 ml  Symptoms: lethargic  Follow-up CBG: Time: 0511 CBG Result:130  Possible Reasons for Event: unstable diabetes   Comments/MD notified:  FNP C. Withrow.     Jefm MilesKhokhal, Ekin Pilar J  Remember to initiate Hypoglycemia Order Set & complete

## 2014-09-14 NOTE — Progress Notes (Signed)
Inpatient Diabetes Program Recommendations  AACE/ADA: New Consensus Statement on Inpatient Glycemic Control (2013)  Target Ranges:  Prepandial:   less than 140 mg/dL      Peak postprandial:   less than 180 mg/dL (1-2 hours)      Critically ill patients:  140 - 180 mg/dL   Note patient admitted with hyperglycemia and glucose=966 mg/dL.    Diabetes history: Type 2 diabetes Outpatient Diabetes medications: Levemir 25 units daily, Humalog 14 units with lunch and dinner. Current orders for Inpatient glycemic control:  Patient transitioned off insulin drip to Levemir 25 units q HS and Novolog moderate tid with meals and HS  Spoke to pt. Regarding potential reasons for elevated CBG's.  She states that she stopped taking her insulin on Tuesday due to being sick and the pain in her chest.  She states that her appetite had decreased prior to the nausea and vomiting.  Briefly discussed sick day rules and the importance of close monitoring of blood sugars when sick.  Explained the importance of calling PCP when she is sick and to get further instructions regarding insulin to prevent hyperglycemia.  She verbalized understanding.  Patient states her appetite is still diminished.   Will follow.  Thanks, Beryl MeagerJenny Erric Machnik, RN, BC-ADM Inpatient Diabetes Coordinator Pager [phone] (8a-5p)

## 2014-09-14 NOTE — Progress Notes (Signed)
Patient transferring to [id number]. Report called to receiving nurse.

## 2014-09-15 DIAGNOSIS — R8290 Unspecified abnormal findings in urine: Secondary | ICD-10-CM

## 2014-09-15 LAB — GLUCOSE, CAPILLARY
GLUCOSE-CAPILLARY: 222 mg/dL — AB (ref 65–99)
Glucose-Capillary: 164 mg/dL — ABNORMAL HIGH (ref 65–99)

## 2014-09-15 LAB — HEMOGLOBIN A1C
Hgb A1c MFr Bld: 13.9 % — ABNORMAL HIGH (ref 4.8–5.6)
Mean Plasma Glucose: 352 mg/dL

## 2014-09-15 MED ORDER — INSULIN DETEMIR 100 UNIT/ML ~~LOC~~ SOLN
20.0000 [IU] | Freq: Two times a day (BID) | SUBCUTANEOUS | Status: DC
  Administered 2014-09-15: 20 [IU] via SUBCUTANEOUS
  Filled 2014-09-15 (×2): qty 0.2

## 2014-09-15 MED ORDER — INSULIN DETEMIR 100 UNIT/ML ~~LOC~~ SOLN: 15.0000 [IU] | Freq: Two times a day (BID) | SUBCUTANEOUS | Status: DC

## 2014-09-15 MED ORDER — CEFPODOXIME PROXETIL 200 MG PO TABS: 200.0000 mg | ORAL_TABLET | Freq: Two times a day (BID) | ORAL | Status: DC

## 2014-09-15 NOTE — Progress Notes (Addendum)
09/15/14 Patient complaining of being kick out of hospital ,but patient has met her goals and doesn't want to be discharged.Patient to be discharged this afternoon ,IV sites removed.Assisted to bathroom with little assist. PT evaluated after discharge instructions stated needs SNF placement, performed badly with eval.

## 2014-09-15 NOTE — Discharge Instructions (Signed)
Follow with Primary MD Morrie Sheldonlark,Katherine Kendal, NP in 7 days   Get CBC, CMP, 2 view Chest X ray checked  by Primary MD next visit.    Activity: As tolerated with Full fall precautions use walker/cane & assistance as needed   Disposition Home     Diet: Heart Healthy Low carb.  Accuchecks 4 times/day, Once in AM empty stomach and then before each meal. Log in all results and show them to your Prim.MD in 3 days. If any glucose reading is under 80 or above 300 call your Prim MD immidiately. Follow Low glucose instructions for glucose under 80 as instructed.    For Heart failure patients - Check your Weight same time everyday, if you gain over 2 pounds, or you develop in leg swelling, experience more shortness of breath or chest pain, call your Primary MD immediately. Follow Cardiac Low Salt Diet and 1.5 lit/day fluid restriction.   On your next visit with your primary care physician please Get Medicines reviewed and adjusted.   Please request your Prim.MD to go over all Hospital Tests and Procedure/Radiological results at the follow up, please get all Hospital records sent to your Prim MD by signing hospital release before you go home.   If you experience worsening of your admission symptoms, develop shortness of breath, life threatening emergency, suicidal or homicidal thoughts you must seek medical attention immediately by calling 911 or calling your MD immediately  if symptoms less severe.  You Must read complete instructions/literature along with all the possible adverse reactions/side effects for all the Medicines you take and that have been prescribed to you. Take any new Medicines after you have completely understood and accpet all the possible adverse reactions/side effects.   Do not drive, operating heavy machinery, perform activities at heights, swimming or participation in water activities or provide baby sitting services if your were admitted for syncope or siezures until you  have seen by Primary MD or a Neurologist and advised to do so again.  Do not drive when taking Pain medications.    Do not take more than prescribed Pain, Sleep and Anxiety Medications  Special Instructions: If you have smoked or chewed Tobacco  in the last 2 yrs please stop smoking, stop any regular Alcohol  and or any Recreational drug use.  Wear Seat belts while driving.   Please note  You were cared for by a hospitalist during your hospital stay. If you have any questions about your discharge medications or the care you received while you were in the hospital after you are discharged, you can call the unit and asked to speak with the hospitalist on call if the hospitalist that took care of you is not available. Once you are discharged, your primary care physician will handle any further medical issues. Please note that NO REFILLS for any discharge medications will be authorized once you are discharged, as it is imperative that you return to your primary care physician (or establish a relationship with a primary care physician if you do not have one) for your aftercare needs so that they can reassess your need for medications and monitor your lab values.

## 2014-09-15 NOTE — Progress Notes (Addendum)
LCSW aware of consult. Patient is planned for DC today with ?SNF consult. Patient is walking with RN with min assist. Patient needs insurance authorization prior to going to SNF. Patient noncompliant with medications at home.    [facility].    AD Called for additional assistance as this is late information on day of DC. LCSW explored with patient 2 options per AD of a LOG for 5 days.  Patient not agreeable with outside facilities. Agreeable for HH.  RN made aware of plan and has [facility].  Deretha EmoryHannah Eevie Lapp LCSW, MSW Clinical Social Work: Emergency Room [phone]

## 2014-09-15 NOTE — Discharge Summary (Signed)
Rachel Gonzalez, is a 64 y.o. female  DOB Feb 21, 1951  MRN [id number].  Admission date:  09/13/2014  Admitting Physician  Leatha Gilding, MD  Discharge Date:  09/15/2014   Primary MD  Morrie Sheldon, NP  Recommendations for primary care physician for things to follow:   Monitor CBC, BMP and glycemic control closely.  Must follow with OB, GI and cardiology outpatient in 1-2 weeks   Admission Diagnosis  Hyperkalemia [E87.5] Renal failure [N19] Hyperglycemia [R73.9]   Discharge Diagnosis  Hyperkalemia [E87.5] Renal failure [N19] Hyperglycemia [R73.9]     Principal Problem:   DKA (diabetic ketoacidoses) Active Problems:   Diabetes mellitus type 2, uncontrolled   Essential hypertension   Anemia   Obesity (BMI 30-39.9)   Abnormal urinalysis   Chest pain   Abdominal pain, recurrent   Nausea vomiting and diarrhea   Dehydration with hyponatremia   Acute hyperkalemia   Acute renal failure superimposed on stage 3 chronic kidney disease      Past Medical History  Diagnosis Date  . Diabetes mellitus   . Hypertension   . Anemia     Past Surgical History  Procedure Laterality Date  . Arm surgery     . Cesarean section    . I&d extremity Right 05/08/2014    Procedure: IRRIGATION AND DEBRIDEMENT FOOT;  Surgeon: Nadara Mustard, MD;  Location: [facility] OR;  Service: Orthopedics;  Laterality: Right;  . Amputation Right 05/08/2014    Procedure: AMPUTATION RAY, right great toe;  Surgeon: Nadara Mustard, MD;  Location: [facility] OR;  Service: Orthopedics;  Laterality: Right;       HPI  from the history and physical done on the day of admission:    This is a 64 year old female patient with known diabetes on insulin, hypertension, anemia and known diabetic neuropathy who presents to the ER with chest pain radiating into  the abdomen and progressive generalized weakness associated with nausea and vomiting and diarrhea. Patient has been having issues for several weeks with nonspecific abdominal pain and has undergone workups with her primary care physician or has been evaluated in the ER with no definitive cause found. Over the past week the patient really hasn't been able to eat or drink much of anything and because of this she did not take her long-acting insulin for fear of inducing hypoglycemia but she had been utilizing her short acting insulin. She also has a history of constipation and had continued using her normal stool softeners and laxatives. In regards to the chest/abdominal pain there is no real pattern to the discomfort; it doesn't particularly come on with food, exertion or rest. She does occasionally have a sensation of bloating with onset of this pain and the pain appears to be colicky in nature and waxes and wanes in intensity and typically will have a duration of several days per occurrence. She states she is having diarrhea but when asked to clarify she is actually having loose stools. There is no blood in the stools or emesis.  She reports her husband has become quite concerned she hasn't eaten or drank in anything in over 72 hours. Patient was last hospitalized in March 2016 for right lower extremity cellulitis and took antibiotics but reports she did not have any diarrhea while taking the antibiotics.  In the ER patient was afebrile, pulse was 96, BP was 143/73. Initial EKG was unremarkable and did not demonstrate evidence of acute ischemia. Troponin was normal. Acute abdominal series revealed no obstruction or free air and moderate stool throughout the colon, no acute process seen on chest x-ray. She did have significant abnormalities in her laboratory results. Her sodium was 120 in the setting of glucose 966, potassium was 6.1 in the setting of acute renal failure with BUN 38 and creatinine 2.6 (baseline  renal function BUN 19 and creatinine 1.43). She had an elevated anion gap of 17 and a mildly elevated alkaline phosphatase of 151 with normal LFTs including normal total bilirubin. CBC was normal. Urinalysis was abnormal as follows: Cloudy appearance, greater than 1000 glucose, moderate leukocytes, 30 protein, urine specific gravity 1.02, many squamous epithelials, and WBCs 11-20. Patient does report urinary symptoms of frequency and urgency. Because of DKA patient has been started on insulin infusion by the ER physician and has been given 2 L of normal saline followed by NS infusion of 125 mL per hour.     Hospital Course:     1. Nausea vomiting and nonspecific chest/epigastric abdominal pain. Patient has had 5 admissions in the last 6 months for similar complaints, she says her pain and nausea have been present for at least 3 months.  Today she is much improved after PPI AND BOWEL REST, SHE HAD FINISHED A TACO WITH HOT SAUCE LAST NIGHT, KUB unremarkable, lipase normal, abdominal exam benign. Her chest pain is reproducible, most likely musculoskeletal since ongoing for 3-5 months. She is now nausea and symptom-free, tolerating diet, will discharge with outpatient GI and cardiology follow-up if needed. Request PCP to kindly follow-up on this.  Note patient wishes to stay for 1-2 more days as she is not feeling up to it in her own words, she has had a repeat EKG this morning which is unremarkable, her lab work is unremarkable, her physical exam is benign. She was counseled on compliance with medications as her A1c is 13.9. She says that she takes her insulin and checks her glucose religiously however she stopped taking her insulin one week ago because she didn't feel like taking it.  She was told that at this point there is nothing that will keep her in the hospital and she can continue taking her medications at home and follow with PCP. She got very upset that she was being pushed out of the hospital.  Note during our conversation nurse Zella Ball was bedside.  Before I was leaving she started complaining that she is now having pain in her vagina for the last few days, I did a bedside which on exam and it was unremarkable, she says that her pain is located in her left vulva, on examis no cysts or abscess was located. Exam was pain free. Nurse Zella Ball assisted.     2. DKA in a type II diabetic patient to stop taking insulin for a week due to #1 above. DKA resolved, off insulin drip, stable post gentle hydration, ? compliance A1c 13.9,   Lab Results  Component Value Date   HGBA1C 13.9* 09/14/2014    CBG (last 3)   Recent Labs  09/14/14 1215  09/14/14 1712 09/14/14 2132  GLUCAP 169* 310* 243*     3. 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 Due to DKA. Creatinine now back to baseline, hyperkalemia resolved with hydration. Monitor.    4. Possible UTI. 2 days Rocephin then PO Vantin.      Discharge Condition: Stable  Follow UP  Follow-up Information    Follow up with Morrie Sheldon, NP. Schedule an appointment as soon as possible for a visit in 1 week.   Specialty:  Nurse Practitioner   Contact information:   [address] [name] [address] [phone]       Follow up with Yates Decamp, MD. Schedule an appointment as soon as possible for a visit in 1 week.   Specialty:  Cardiology   Why:  chest pain   Contact information:   [address] [name] [address] [phone]       Follow up with Stan Head, MD. Schedule an appointment as soon as possible for a visit in 1 week.   Specialty:  Gastroenterology   Why:  EGD   Contact information:   [address] [name] [address] [phone]        Consults obtained - None  Diet and Activity recommendation: See Discharge Instructions below  Discharge Instructions       Discharge Instructions    Discharge instructions    Complete by:  As directed   Follow with Primary MD Morrie Sheldon,  NP in 7 days   Get CBC, CMP, 2 view Chest X ray checked  by Primary MD next visit.    Activity: As tolerated with Full fall precautions use walker/cane & assistance as needed   Disposition Home     Diet: Heart Healthy Low carb.  Accuchecks 4 times/day, Once in AM empty stomach and then before each meal. Log in all results and show them to your Prim.MD in 3 days. If any glucose reading is under 80 or above 300 call your Prim MD immidiately. Follow Low glucose instructions for glucose under 80 as instructed.    For Heart failure patients - Check your Weight same time everyday, if you gain over 2 pounds, or you develop in leg swelling, experience more shortness of breath or chest pain, call your Primary MD immediately. Follow Cardiac Low Salt Diet and 1.5 lit/day fluid restriction.   On your next visit with your primary care physician please Get Medicines reviewed and adjusted.   Please request your Prim.MD to go over all Hospital Tests and Procedure/Radiological results at the follow up, please get all Hospital records sent to your Prim MD by signing hospital release before you go home.   If you experience worsening of your admission symptoms, develop shortness of breath, life threatening emergency, suicidal or homicidal thoughts you must seek medical attention immediately by calling 911 or calling your MD immediately  if symptoms less severe.  You Must read complete instructions/literature along with all the possible adverse reactions/side effects for all the Medicines you take and that have been prescribed to you. Take any new Medicines after you have completely understood and accpet all the possible adverse reactions/side effects.   Do not drive, operating heavy machinery, perform activities at heights, swimming or participation in water activities or provide baby sitting services if your were admitted for syncope or siezures until you have seen by Primary MD or a Neurologist and  advised to do so again.  Do not drive when taking Pain medications.  Do not take more than prescribed Pain, Sleep and Anxiety Medications  Special Instructions: If you have smoked or chewed Tobacco  in the last 2 yrs please stop smoking, stop any regular Alcohol  and or any Recreational drug use.  Wear Seat belts while driving.   Please note  You were cared for by a hospitalist during your hospital stay. If you have any questions about your discharge medications or the care you received while you were in the hospital after you are discharged, you can call the unit and asked to speak with the hospitalist on call if the hospitalist that took care of you is not available. Once you are discharged, your primary care physician will handle any further medical issues. Please note that NO REFILLS for any discharge medications will be authorized once you are discharged, as it is imperative that you return to your primary care physician (or establish a relationship with a primary care physician if you do not have one) for your aftercare needs so that they can reassess your need for medications and monitor your lab values.     Increase activity slowly    Complete by:  As directed              Discharge Medications       Medication List    TAKE these medications        cefpodoxime 200 MG tablet  Commonly known as:  VANTIN  Take 1 tablet (200 mg total) by mouth 2 (two) times daily.     docusate sodium 100 MG capsule  Commonly known as:  COLACE  Take 1 capsule (100 mg total) by mouth 2 (two) times daily.     ferrous sulfate 325 (65 FE) MG tablet  Take 1 tablet (325 mg total) by mouth daily with breakfast.     gabapentin 100 MG capsule  Commonly known as:  NEURONTIN  Take 1 capsule (100 mg total) by mouth 2 (two) times daily.     hydrochlorothiazide 25 MG tablet  Commonly known as:  HYDRODIURIL  Take 1 tablet (25 mg total) by mouth daily.     insulin detemir 100 UNIT/ML injection    Commonly known as:  LEVEMIR  Inject 0.15 mLs (15 Units total) into the skin 2 (two) times daily.     insulin lispro 100 UNIT/ML KiwkPen  Commonly known as:  HUMALOG KWIKPEN  Inject 14 units subcutaneously with lunch and dinner.  Hold if blood sugar is below 110.     lisinopril 2.5 MG tablet  Commonly known as:  PRINIVIL,ZESTRIL  Take 1 tablet (2.5 mg total) by mouth daily.     ondansetron 4 MG disintegrating tablet  Commonly known as:  ZOFRAN ODT  Take 1 tablet (4 mg total) by mouth every 8 (eight) hours as needed for nausea or vomiting.     oxyCODONE-acetaminophen 5-325 MG per tablet  Commonly known as:  PERCOCET/ROXICET  Take 1 tablet by mouth every 8 (eight) hours as needed for severe pain.     pantoprazole 40 MG tablet  Commonly known as:  PROTONIX  Take 1 tablet (40 mg total) by mouth daily at 12 noon.     polyethylene glycol packet  Commonly known as:  MIRALAX / GLYCOLAX  Take 17 g by mouth 2 (two) times daily. Take 17g PO BID until you have daily soft bowel movements, then you may decrease dose to 17g PO QD        Major procedures and Radiology Reports -  PLEASE review detailed and final reports for all details, in brief -     Dg Chest 2 View  09/13/2014   CLINICAL DATA:  Chest pain  EXAM: CHEST  2 VIEW  COMPARISON:  09/05/2014  FINDINGS: The heart size and mediastinal contours are within normal limits. Both lungs are clear. The visualized skeletal structures are unremarkable.  IMPRESSION: No active cardiopulmonary disease.   Electronically Signed   By: Marlan Palauharles  Clark M.D.   On: 09/13/2014 13:20   Dg Chest 2 View  08/30/2014   CLINICAL DATA:  Chest pain.  EXAM: CHEST  2 VIEW  COMPARISON:  May 06, 2014.  FINDINGS: The heart size and mediastinal contours are within normal limits. Both lungs are clear. No pneumothorax or pleural effusion is noted. The visualized skeletal structures are unremarkable.  IMPRESSION: No active cardiopulmonary disease.   Electronically Signed    By: Lupita RaiderJames  Green Jr, M.D.   On: 08/30/2014 16:50   Ct Abdomen Pelvis W Contrast  08/30/2014   CLINICAL DATA:  Left-sided abdominal pain.  EXAM: CT ABDOMEN AND PELVIS WITH CONTRAST  TECHNIQUE: Multidetector CT imaging of the abdomen and pelvis was performed using the standard protocol following bolus administration of intravenous contrast.  CONTRAST:  100mL OMNIPAQUE IOHEXOL 300 MG/ML  SOLN  COMPARISON:  CT scan of June 18, 2010.  FINDINGS: Visualized lung bases appear normal. No significant osseous abnormality is noted.  No gallstones are noted. The liver, spleen and pancreas appear normal. Adrenal glands and kidneys appear normal. No hydronephrosis or renal obstruction is noted. No renal or ureteral calculi are noted. The appendix appears normal. There is no evidence of bowel obstruction. Stool is noted throughout the colon. No abnormal fluid collection is noted. Uterus and ovaries appear normal. Urinary bladder appears normal. No significant adenopathy is noted.  IMPRESSION: No definite abnormality seen in the abdomen or pelvis.   Electronically Signed   By: Lupita RaiderJames  Green Jr, M.D.   On: 08/30/2014 22:51   Dg Abd Acute W/chest  09/13/2014   CLINICAL DATA:  Three-day history of abdominal pain with diarrhea and emesis  EXAM: DG ABDOMEN ACUTE W/ 1V CHEST  COMPARISON:  Abdomen series September 05, 2014; chest radiograph September 13, 2014  FINDINGS: PA chest: There is no edema or consolidation. The heart size and pulmonary vascularity are normal. No adenopathy.  Supine and upright abdomen: There is moderate stool throughout colon. There is no bowel dilatation or air-fluid level suggesting obstruction. No free air. There are occasional foci of vascular calcification in the pelvis bilaterally.  IMPRESSION: Bowel gas pattern unremarkable. No obstruction or free air. Moderate stool throughout colon. No lung edema or consolidation.   Electronically Signed   By: Bretta BangWilliam  Woodruff III M.D.   On: 09/13/2014 14:09   Dg Abd Acute  W/chest  09/05/2014   CLINICAL DATA:  Left-sided abdominal pain and low back pain.  EXAM: DG ABDOMEN ACUTE W/ 1V CHEST  COMPARISON:  CT of 08/30/2014.  Chest radiograph 08/30/2014.  FINDINGS: Frontal view of the chest demonstrates midline trachea. Normal heart size and mediastinal contours. No pleural effusion or pneumothorax. Clear lungs.  Abdominal films demonstrate no free intraperitoneal air or significant air-fluid levels on upright positioning. No bowel distention on supine imaging. Moderate amount of stool throughout the colon. No abnormal abdominal calcifications. No appendicolith. Distal gas and stool.  IMPRESSION: No acute findings.  Possible constipation.   Electronically Signed   By: Jeronimo GreavesKyle  Talbot M.D.   On: 09/05/2014 19:24  Micro Results      Recent Results (from the past 240 hour(s))  Urine culture     Status: None   Collection Time: 09/13/14  3:04 PM  Result Value Ref Range Status   Specimen Description URINE, CLEAN CATCH  Final   Special Requests NONE  Final   Culture   Final    MULTIPLE SPECIES PRESENT, SUGGEST RECOLLECTION IF CLINICALLY INDICATED   Report Status 09/14/2014 FINAL  Final  Culture, blood (routine x 2)     Status: None (Preliminary result)   Collection Time: 09/13/14  3:49 PM  Result Value Ref Range Status   Specimen Description BLOOD LEFT ARM  Final   Special Requests BOTTLES DRAWN AEROBIC AND ANAEROBIC 5CC  Final   Culture NO GROWTH < 24 HOURS  Final   Report Status PENDING  Incomplete  Culture, blood (routine x 2)     Status: None (Preliminary result)   Collection Time: 09/13/14  4:30 PM  Result Value Ref Range Status   Specimen Description BLOOD HAND RIGHT  Final   Special Requests BOTTLES DRAWN AEROBIC ONLY 10CC  Final   Culture NO GROWTH < 24 HOURS  Final   Report Status PENDING  Incomplete  MRSA PCR Screening     Status: None   Collection Time: 09/13/14  6:07 PM  Result Value Ref Range Status   MRSA by PCR NEGATIVE NEGATIVE Final    Comment:         The GeneXpert MRSA Assay (FDA approved for NASAL specimens only), is one component of a comprehensive MRSA colonization surveillance program. It is not intended to diagnose MRSA infection nor to guide or monitor treatment for MRSA infections.        Today   Subjective    Kerianne Gurr today has no headache,no chest abdominal pain,no new weakness tingling or numbness, feels much better .   Objective   Blood pressure 132/71, pulse 93, temperature 99.4 F (37.4 C), temperature source Oral, resp. rate 20, height 5\' 2"  (1.575 m), weight 75.66 kg (166 lb 12.8 oz), SpO2 95 %.   Intake/Output Summary (Last 24 hours) at 09/15/14 0803 Last data filed at 09/15/14 1610  Gross per 24 hour  Intake    900 ml  Output    200 ml  Net    700 ml    Exam Awake Alert, Oriented x 3, No new F.N deficits, Normal affect [place].AT,PERRAL Supple Neck,No JVD, No cervical lymphadenopathy appriciated.  Symmetrical Chest wall movement, Good air movement bilaterally, CTAB RRR,No Gallops,Rubs or new Murmurs, No Parasternal Heave +ve B.Sounds, Abd Soft, Non tender, No organomegaly appriciated, No rebound -guarding or rigidity. No Cyanosis, Clubbing or edema, No new Rash or bruise Bedside vaginal exam unremarkable no cyst or abscess palpated, exam nontender   Data Review   CBC w Diff: Lab Results  Component Value Date   WBC 5.8 09/13/2014   HGB 12.0 09/13/2014   HCT 36.0 09/13/2014   PLT 208 09/13/2014   LYMPHOPCT 38 09/05/2014   MONOPCT 6 09/05/2014   EOSPCT 1 09/05/2014   BASOPCT 0 09/05/2014    CMP: Lab Results  Component Value Date   NA 132* 09/13/2014   K 3.7 09/13/2014   CL 98* 09/13/2014   CO2 27 09/13/2014   BUN 25* 09/13/2014   CREATININE 1.27* 09/13/2014   CREATININE 0.81 03/05/2011   PROT 7.7 09/13/2014   ALBUMIN 4.0 09/13/2014   BILITOT 0.9 09/13/2014   ALKPHOS 151* 09/13/2014   AST 17  09/13/2014   ALT 14 09/13/2014  .   Total Time in preparing paper work,  data evaluation and todays exam - 35 minutes  Leroy Sea M.D on 09/15/2014 at 8:03 AM  [facility]   Office  [phone]

## 2014-09-15 NOTE — Plan of Care (Signed)
Note this morning patient was observed by the nursing staff, she got up and went to the bathroom by herself and came back to the bed.  She was subsequently discharged home however patient was reluctant going home.  After discharge process was done PT came to evaluate the patient, patient performed poorly with PT and PT recommends SNF placement. SNF placement initiated. Question if her weakness is effort dependent.

## 2014-09-15 NOTE — Care Management Note (Signed)
Case Management Note  Patient Details  Name: Rachel Gonzalez MRN: [id number] Date of Birth: 1950-09-04  Subjective/Objective:                  Hyperkalemia Renal failure Hyperglycemia   Action/Plan: CM spoke to patient at the bedside who states that she is going home today with the support of her husband and home health. CM offered patient choice and pt choice is [facility]. Pt states that she has a wheelchair, cane, BSC, and shower chair at home. Rolling walker ordered; CM [facility] who states that he would bring the equipment. CM [facility]. Pt denies further discharge needs and states that she lives at home with her husband and that her son is coming to pick her up. No further CM needs communicated.   Expected Discharge Date:  09/15/14               Expected Discharge Plan:  Home w Home Health Services  In-House Referral:     Discharge planning Services  CM Consult  Post Acute Care Choice:  Durable Medical Equipment, Home Health Choice offered to:  Patient  DME Arranged:  Walker rolling DME Agency:  [facility]  HH Arr[facility]  [facility]  [facility]  Status of Service:  Completed, signed off  Medicare Important Message Given:    Date Medicare IM Given:    Medicare IM give by:    Date Additional Medicare IM Given:    Additional Medicare Important Message give by:     If discussed at Long Length of Stay Meetings, dates discussed:    Additional Comments:  Darcel SmallingAnna C Wreatha Sturgeon, RN 09/15/2014, 4:18 PM

## 2014-09-15 NOTE — Evaluation (Signed)
Occupational Therapy Evaluation Patient Details Name: Rachel HalfCarol A Taborn MRN: [id number] DOB: Jan 14, 1951 Today's Date: 09/15/2014    History of Present Illness 64 yo female with uncontrolled DM and chest pain was admitted with symptoms of cognitive change, home alone at times and in acute renal failure   Clinical Impression   Pt getting dressed for discharge and calling friend for a ride stating she was getting kicked out. Pt anticipated and wants ST rehab in SNF. Pt demonstrated impaired balance which improved with use of RW.  She needed assist for LB dressing with one LOB backward, requiring guarding, in standing.  Will follow acutely.    Follow Up Recommendations  SNF (HHOT if pt returns home)    Equipment Recommendations  None recommended by OT    Recommendations for Other Services       Precautions / Restrictions Precautions Precautions: Fall Restrictions Weight Bearing Restrictions: No      Mobility Bed Mobility Overal bed mobility: Modified Independent                Transfers Overall transfer level: Needs assistance Equipment used: Rolling walker (2 wheeled) Transfers: Sit to/from Stand Sit to Stand: Min guard Stand pivot transfers: Min assist       General transfer comment: safety with stand related to RLE control which is insecure and looks buckling at times,     Balance Overall balance assessment: Needs assistance         Standing balance support: Bilateral upper extremity supported Standing balance-Leahy Scale: Poor Standing balance comment: better RLE control in static                            ADL Overall ADL's : Needs assistance/impaired Eating/Feeding: Independent;Sitting   Grooming: Brushing hair;Standing;Min guard   Upper Body Bathing: Set up;Sitting   Lower Body Bathing: Minimal assistance;Sit to/from stand   Upper Body Dressing : Set up;Sitting   Lower Body Dressing: Minimal assistance;Sit to/from stand Lower Body  Dressing Details (indicate cue type and reason): assist for balance when pulling up pants, assist for post op shoe and socks, wears slip on shoe on L foot Toilet Transfer: Min guard;RW;Ambulation   Toileting- Clothing Manipulation and Hygiene: Min guard;Sit to/from stand       Functional mobility during ADLs: Min guard;Rolling walker General ADL Comments: Reports the RW she received in February doesn't work right, but not specific.     Vision     Perception     Praxis      Pertinent Vitals/Pain Pain Assessment: No/denies pain     Hand Dominance Right   Extremity/Trunk Assessment Upper Extremity Assessment Upper Extremity Assessment: Overall WFL for tasks assessed   Lower Extremity Assessment Lower Extremity Assessment: Defer to PT evaluation (wearing post op shoe on R since toe amputation in Feb?) RLE Deficits / Details: R DF is 4 strength RLE Coordination: decreased gross motor   Cervical / Trunk Assessment Cervical / Trunk Assessment: Normal   Communication Communication Communication: No difficulties   Cognition Arousal/Alertness: Awake/alert Behavior During Therapy: WFL for tasks assessed/performed Overall Cognitive Status: No family/caregiver present to determine baseline cognitive functioning Area of Impairment: Safety/judgement;Problem solving;Awareness     Memory: Decreased recall of precautions   Safety/Judgement: Decreased awareness of safety;Decreased awareness of deficits Awareness: Intellectual Problem Solving: Decreased initiation;Slow processing;Requires verbal cues     General Comments       Exercises Exercises: Other exercises (R ankle 4/5 strength)  Shoulder Instructions      Home Living Family/patient expects to be discharged to:: Private residence Living Arrangements: Spouse/significant other Available Help at Discharge: Family;Available PRN/intermittently Type of Home: House Home Access: Level entry     Home Layout: One  level     Bathroom Shower/Tub: Producer, television/film/video: Handicapped height     Home Equipment: Environmental consultant - 2 wheels;Cane - single point;Shower seat;Bedside commode;Grab bars - toilet;Grab bars - tub/shower   Additional Comments: husband works days      Prior Functioning/Environment Level of Independence: Independent with assistive device(s)             OT Diagnosis: Generalized weakness   OT Problem List: Impaired balance (sitting and/or standing);Decreased safety awareness   OT Treatment/Interventions: Self-care/ADL training;DME and/or AE instruction;Patient/family education;Balance training    OT Goals(Current goals can be found in the care plan section) Acute Rehab OT Goals Patient Stated Goal: go to rehab OT Goal Formulation: With patient Time For Goal Achievement: 09/22/14 Potential to Achieve Goals: Good ADL Goals Pt Will Perform Grooming: with modified independence;standing Pt Will Perform Lower Body Bathing: with modified independence;sit to/from stand Pt Will Perform Lower Body Dressing: with modified independence;sit to/from stand Pt Will Transfer to Toilet: with modified independence;ambulating Pt Will Perform Toileting - Clothing Manipulation and hygiene: with modified independence;sit to/from stand Pt Will Perform Tub/Shower Transfer: Shower transfer;with modified independence;ambulating;shower seat;grab bars;rolling walker  OT Frequency: Min 2X/week   Barriers to D/C: Decreased caregiver support          Co-evaluation              End of Session Equipment Utilized During Treatment: Engineer, water Communication:  (pt wants to go to ST rehab)  Activity Tolerance: Patient tolerated treatment well Patient left: in bed;with call bell/phone within reach   Time: 1515-1534 OT Time Calculation (min): 19 min Charges:  OT General Charges $OT Visit: 1 Procedure OT Evaluation $Initial OT Evaluation Tier I: 1 Procedure G-Codes:     Evern Bio 09/15/2014, 3:53 PM

## 2014-09-15 NOTE — Evaluation (Signed)
Physical Therapy Evaluation Patient Details Name: Rachel Gonzalez MRN: [id number] DOB: 1950/10/08 Today's Date: 09/15/2014   History of Present Illness  64 yo female with uncontrolled DM and chest pain was admitted with symptoms of cognitive change, home alone at times and in acute renal failure  Clinical Impression  Pt was assessed and noted unsafe gait, needed assistance to stand from commode.  Her plan is to investigate SNF admission with SW contacted by PT to get permission.  Will keep on caseload for her stay to increase RLE strength and work on balance and gait control.    Follow Up Recommendations SNF    Equipment Recommendations  Rolling walker with 5" wheels    Recommendations for Other Services       Precautions / Restrictions Precautions Precautions: Fall;Other (comment) (telemetry) Restrictions Weight Bearing Restrictions: No      Mobility  Bed Mobility Overal bed mobility: Modified Independent                Transfers Overall transfer level: Needs assistance Equipment used: Rolling walker (2 wheeled);1 person hand held assist Transfers: Sit to/from UGI CorporationStand;Stand Pivot Transfers Sit to Stand: Min assist Stand pivot transfers: Min assist       General transfer comment: safety with stand related to RLE control which is insecure and looks buckling at times,   Ambulation/Gait Ambulation/Gait assistance: Min assist Ambulation Distance (Feet): 40 Feet Assistive device: Rolling walker (2 wheeled);1 person hand held assist Gait Pattern/deviations: Decreased dorsiflexion - right;Decreased dorsiflexion - left;Wide base of support;Trunk flexed;Drifts right/left;Ataxic Gait velocity: reduced, halting Gait velocity interpretation: Below normal speed for age/gender General Gait Details: due to buckling on RLE with weakness on ankle. pt was not safe to walk far, required close guard and a great deal of cuing for safety  Stairs            Wheelchair Mobility    Modified Rankin (Stroke Patients Only)       Balance Overall balance assessment: Needs assistance         Standing balance support: Bilateral upper extremity supported Standing balance-Leahy Scale: Poor Standing balance comment: better RLE control in static                             Pertinent Vitals/Pain Pain Assessment: No/denies pain    Home Living Family/patient expects to be discharged to:: Private residence Living Arrangements: Spouse/significant other Available Help at Discharge: Family;Available PRN/intermittently Type of Home: House Home Access: Level entry     Home Layout: One level Home Equipment: Walker - 2 wheels;Cane - single point;Shower seat;Bedside commode      Prior Function Level of Independence: Independent with assistive device(s)               Hand Dominance   Dominant Hand: Right    Extremity/Trunk Assessment   Upper Extremity Assessment: Generalized weakness           Lower Extremity Assessment: RLE deficits/detail RLE Deficits / Details: R DF is 4 strength    Cervical / Trunk Assessment: Normal  Communication   Communication: Other (comment) (some what confused)  Cognition Arousal/Alertness: Awake/alert Behavior During Therapy: Impulsive Overall Cognitive Status: Impaired/Different from baseline Area of Impairment: Safety/judgement;Problem solving;Awareness     Memory: Decreased recall of precautions   Safety/Judgement: Decreased awareness of safety;Decreased awareness of deficits Awareness: Intellectual Problem Solving: Decreased initiation;Slow processing;Requires verbal cues      General Comments General comments (skin integrity,  edema, etc.): Limited tolerance for standing and walking due to RLE instability and recent R great toe amputation.      Exercises        Assessment/Plan    PT Assessment Patient needs continued PT services  PT Diagnosis Difficulty walking   PT Problem List  Decreased strength;Decreased range of motion;Decreased activity tolerance;Decreased balance;Decreased mobility;Decreased cognition;Decreased coordination;Decreased knowledge of use of DME;Decreased safety awareness;Decreased knowledge of precautions;Cardiopulmonary status limiting activity;Decreased skin integrity  PT Treatment Interventions DME instruction;Gait training;Functional mobility training;Therapeutic activities;Therapeutic exercise;Balance training;Neuromuscular re-education;Patient/family education;Cognitive remediation   PT Goals (Current goals can be found in the Care Plan section) Acute Rehab PT Goals Patient Stated Goal: get home PT Goal Formulation: With patient Time For Goal Achievement: 09/29/14 Potential to Achieve Goals: Good    Frequency Min 2X/week   Barriers to discharge Decreased caregiver support;Inaccessible home environment      Co-evaluation               End of Session Equipment Utilized During Treatment: Gait belt Activity Tolerance: Patient limited by fatigue;Patient limited by lethargy Patient left: in bed;with call bell/phone within reach;with bed alarm set Nurse Communication: Mobility status         Time: 8119-1478 PT Time Calculation (min) (ACUTE ONLY): 21 min   Charges:   PT Evaluation $Initial PT Evaluation Tier I: 1 Procedure     PT G CodesIvar Gonzalez 10/01/14, 12:51 PM   Rachel Gonzalez, PT MS Acute Rehab Dept. Number: [facility] [id number] and [facility] [phone]

## 2014-09-17 ENCOUNTER — Ambulatory Visit: Payer: 59 | Admitting: Primary Care

## 2014-09-18 LAB — CULTURE, BLOOD (ROUTINE X 2)
CULTURE: NO GROWTH
Culture: NO GROWTH

## 2014-09-27 ENCOUNTER — Telehealth: Payer: Self-pay

## 2014-09-27 NOTE — Telephone Encounter (Signed)
Left a message for patient to return my call about having a Mammogram set up. Will await call back.  

## 2014-10-01 ENCOUNTER — Telehealth: Payer: Self-pay

## 2014-10-01 ENCOUNTER — Encounter: Payer: Self-pay | Admitting: Primary Care

## 2014-10-01 ENCOUNTER — Ambulatory Visit (INDEPENDENT_AMBULATORY_CARE_PROVIDER_SITE_OTHER): Payer: Self-pay | Admitting: Primary Care

## 2014-10-01 VITALS — BP 116/64 | HR 80 | Temp 98.0°F | Ht 62.0 in | Wt 174.0 lb

## 2014-10-01 DIAGNOSIS — R079 Chest pain, unspecified: Secondary | ICD-10-CM

## 2014-10-01 DIAGNOSIS — I10 Essential (primary) hypertension: Secondary | ICD-10-CM

## 2014-10-01 DIAGNOSIS — R197 Diarrhea, unspecified: Secondary | ICD-10-CM

## 2014-10-01 DIAGNOSIS — IMO0002 Reserved for concepts with insufficient information to code with codable children: Secondary | ICD-10-CM

## 2014-10-01 DIAGNOSIS — E1165 Type 2 diabetes mellitus with hyperglycemia: Secondary | ICD-10-CM

## 2014-10-01 DIAGNOSIS — K219 Gastro-esophageal reflux disease without esophagitis: Secondary | ICD-10-CM

## 2014-10-01 DIAGNOSIS — R112 Nausea with vomiting, unspecified: Secondary | ICD-10-CM

## 2014-10-01 LAB — BASIC METABOLIC PANEL
BUN: 22 mg/dL (ref 6–23)
CO2: 29 meq/L (ref 19–32)
Calcium: 9.8 mg/dL (ref 8.4–10.5)
Chloride: 103 mEq/L (ref 96–112)
Creatinine, Ser: 1.38 mg/dL — ABNORMAL HIGH (ref 0.40–1.20)
GFR: 49.68 mL/min — ABNORMAL LOW (ref >60.00)
GLUCOSE: 93 mg/dL (ref 70–99)
Potassium: 4.1 mEq/L (ref 3.5–5.1)
SODIUM: 140 meq/L (ref 135–145)

## 2014-10-01 LAB — GLUCOSE, POCT (MANUAL RESULT ENTRY)
POC GLUCOSE: 124 mg/dL — AB (ref 70–99)
POC Glucose: 42 mg/dl — AB (ref 70–99)
POC Glucose: 66 mg/dl — AB (ref 70–99)

## 2014-10-01 MED ORDER — HYDROCHLOROTHIAZIDE 12.5 MG PO TABS: 12.5000 mg | ORAL_TABLET | Freq: Every day | ORAL | Status: DC

## 2014-10-01 MED ORDER — TRIAMCINOLONE ACETONIDE 0.1 % EX CREA: 1.0000 "application " | TOPICAL_CREAM | Freq: Two times a day (BID) | CUTANEOUS | Status: DC | PRN

## 2014-10-01 MED ORDER — PANTOPRAZOLE SODIUM 20 MG PO TBEC: 20.0000 mg | DELAYED_RELEASE_TABLET | Freq: Every day | ORAL | Status: DC

## 2014-10-01 NOTE — Progress Notes (Signed)
Pre visit review using our clinic review tool, if applicable. No additional management support is needed unless otherwise documented below in the visit note. 

## 2014-10-01 NOTE — Assessment & Plan Note (Signed)
She declines to follow up with cardiology despite recommendation from prior hospitalization.

## 2014-10-01 NOTE — Assessment & Plan Note (Signed)
She declines follow up with GI despite recommendations from prior hospitalization.

## 2014-10-01 NOTE — Assessment & Plan Note (Signed)
Stable on current meds. Will decrease HCTZ to 12.5 mg due to recent hyponatremia. Will consider eliminating ACE if creatinine is elevated.

## 2014-10-01 NOTE — Progress Notes (Signed)
Subjective:    Patient ID: Rachel Gonzalez, female    DOB: 1950/09/14, 64 y.o.   MRN: [id number]  HPI  Rachel Gonzalez is a 64 year old female who presents today for follow up.   1) Hospital Follow Up: She presented to [facility] with a chief complaint of diarrhea, weakness, body aches, and chest pain on 09/13/14. She was found to be hyperglycemic with hyponatremia and hyprekalemia. She was admitted to stepdown and initiated on an insulin infusion with regular checks of blood levels. She was advised upon discharge to follow up with cardiology and GI regarding symptoms.   Since discharge from the hospital she reports tenderness and itching to her skin under her left breast for the past 2 months. She has not completed a mammogram in the past several years. She's also reporting itching to her back for the past 2 months. Denies new soaps or detergents. Her husband reports some small bumps without drainage.   Today she declines follow up to cardiology for further evaluation of chest pain and she declines follow up to GI for follow up of diarrhea and abdominal pain.  2) Diabetes: She is checking her blood sugars once to twice daily. In the morning she's been getting 91-170's with an average of 115. She will get about 160's-170's around 5pm. She will not check her blood sugars more frequently than twice daily and does not coordinate them on a regular schedule. She is to be taking Levemir 25 units at bedtime and Humalog 14 units at lunch and dinner. She took her Humalog this morning even though it's not prescribed this way. She did eat breakfast this morning. She reports feeling weak and dizzy at the moment. Her initial blood sugar was checked in the office and was 42, she was provided with crackers and apple juice, blood sugar increased to 66, she was then provided with more juice and crackers and was up to 120 upon leaving the office. She has been through diabetes education in the hospital. Her right foot continues to  heal. Her last appointment with podiatry was July 18th. She is scheduled to have a follow up with podiatry in late August.  Her current diet consists of: Breakfast: Oatmeal bars, cereal, fruit, glucerna Lunch: Sandwich, soup, salad. Once weekly will get tacos. Dinner: Soup, cracker. Occasional [employer].  Beverages: sodas, sweet tea, water, juice  3) Hypertension: Continues to take lisinopril and HCTZ as prescribed. She was noted have a potassium of 6.1 and sodium of 120 in the hospital on 09/13/14. Her BP meds were held at the time. Her BP today is stable. She is taking her Lisinopril 2.5 mg and HCTZ 25.   Review of Systems  Constitutional: Negative for fever and chills.  Respiratory: Negative for shortness of breath.   Cardiovascular: Negative for chest pain.  Gastrointestinal: Negative for abdominal pain.  Skin: Positive for rash.       Itching and rash to posterior chest and anterior chest under left breast.  Neurological: Positive for dizziness and weakness. Negative for numbness and headaches.       Past Medical History  Diagnosis Date  . Diabetes mellitus   . Hypertension   . Anemia     History   Social History  . Marital Status: Married    Spouse Name: N/A  . Number of Children: N/A  . Years of Education: N/A   Occupational History  . Not on file.   Social History Main Topics  . Smoking status: Never  Smoker   . Smokeless tobacco: Not on file  . Alcohol Use: No  . Drug Use: No  . Sexual Activity: Not on file   Other Topics Concern  . Not on file   Social History Narrative   From [place].   Married.   Three children, 9 grandchildren.   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   Travels to [place], [place]       Past Surgical History  Procedure Laterality Date  . Arm surgery     . Cesarean section    . I&d extremity Right 05/08/2014    Procedure: IRRIGATION AND DEBRIDEMENT FOOT;  Surgeon: Nadara Mustard, MD;   Location: [facility] OR;  Service: Orthopedics;  Laterality: Right;  . Amputation Right 05/08/2014    Procedure: AMPUTATION RAY, right great toe;  Surgeon: Nadara Mustard, MD;  Location: [facility] OR;  Service: Orthopedics;  Laterality: Right;    Family History  Problem Relation Age of Onset  . Diabetes Mother     Allergies  Allergen Reactions  . Tramadol Nausea And Vomiting    Current Outpatient Prescriptions on File Prior to Visit  Medication Sig Dispense Refill  . cefpodoxime (VANTIN) 200 MG tablet Take 1 tablet (200 mg total) by mouth 2 (two) times daily. 4 tablet 0  . docusate sodium (COLACE) 100 MG capsule Take 1 capsule (100 mg total) by mouth 2 (two) times daily. 10 capsule 0  . ferrous sulfate 325 (65 FE) MG tablet Take 1 tablet (325 mg total) by mouth daily with breakfast.  3  . gabapentin (NEURONTIN) 100 MG capsule Take 1 capsule (100 mg total) by mouth 2 (two) times daily. 60 capsule 5  . insulin detemir (LEVEMIR) 100 UNIT/ML injection Inject 0.15 mLs (15 Units total) into the skin 2 (two) times daily. 10 mL 11  . insulin lispro (HUMALOG KWIKPEN) 100 UNIT/ML KiwkPen Inject 14 units subcutaneously with lunch and dinner.  Hold if blood sugar is below 110. 15 mL 11  . lisinopril (PRINIVIL,ZESTRIL) 2.5 MG tablet Take 1 tablet (2.5 mg total) by mouth daily. 30 tablet 5  . ondansetron (ZOFRAN ODT) 4 MG disintegrating tablet Take 1 tablet (4 mg total) by mouth every 8 (eight) hours as needed for nausea or vomiting. 15 tablet 0  . oxyCODONE-acetaminophen (PERCOCET/ROXICET) 5-325 MG per tablet Take 1 tablet by mouth every 8 (eight) hours as needed for severe pain. 15 tablet 0  . polyethylene glycol (MIRALAX / GLYCOLAX) packet Take 17 g by mouth 2 (two) times daily. Take 17g PO BID until you have daily soft bowel movements, then you may decrease dose to 17g PO QD 14 each 0   No current facility-administered medications on file prior to visit.    BP 116/64 mmHg  Pulse 80  Temp(Src) 98 F (36.7 C)  (Oral)  Ht 5\' 2"  (1.575 m)  Wt 174 lb (78.926 kg)  BMI 31.82 kg/m2  SpO2 99%    Objective:   Physical Exam  Constitutional: She is oriented to person, place, and time. She appears well-nourished.  Appeared weak upon initial interview. After blood sugar returned to normal, she was more attentive and alert.  Cardiovascular: Normal rate and regular rhythm.   Pulmonary/Chest: Effort normal and breath sounds normal.  Abdominal: Soft. Bowel sounds are normal. There is no tenderness.  Neurological: She is alert and oriented to person, place, and time.  Skin: Skin is warm and dry.  Mild rash to  posterior chest.          Assessment & Plan:  Patient spent 3 hours in our clinic due to low blood sugar levels. Upon discharge her blood sugar increased to 126. She was provided with instructions to skip her insulin dose this afternoon and to check her sugars before administering her levemir tonight. If sugar is below 160 the hold.

## 2014-10-01 NOTE — Telephone Encounter (Signed)
Pt at pharmacy and request cream sent to pharmacy; Mayra Reel NP said to call in triamcinolone Cream 0.1 % with instructions one application topically bid prn for rash # 30 gm x 0. Med called to Leonette Most at [facility]. Pt notified rx called in.

## 2014-10-01 NOTE — Patient Instructions (Addendum)
Hold your insulin for today.  Check your blood sugar at bedtime tonight. If it is above 160 then take the Levemir.  You must check your blood sugar before you take your insulin. If it is below 120 then DO NOT TAKE.   Insulin instruction: Take Humalog 14 units at lunch and dinner. Take Levemir 20 units at bedtime.  We are decreasing your Hydrochlorothiazide medication for blood pressure to 12.5 mg. This is a reduction from . Take 1 tablet by mouth daily.  Complete lab work prior to leaving today. I will notify you of your results.  Follow up in 2 weeks for re-evaluation. It was nice to see you!

## 2014-10-01 NOTE — Assessment & Plan Note (Signed)
BS of 42 in office today. She administered her insulin this morning as she is not to do.  Continue Humalog 14 units at lunch and dinner. Decrease Levemir to 20 units at bedtime. Will refer to endocrinology due to complicated case, frequent hospitalizations with hyperglycemia. BMP today. Follow up in 2 weeks. Specific instructions provided regarding administration of insulin. Discussed to hold insulin if BS below 120.

## 2014-10-15 ENCOUNTER — Encounter: Payer: Self-pay | Admitting: Primary Care

## 2014-10-15 ENCOUNTER — Ambulatory Visit (INDEPENDENT_AMBULATORY_CARE_PROVIDER_SITE_OTHER): Payer: Self-pay | Admitting: Primary Care

## 2014-10-15 VITALS — BP 132/72 | HR 78 | Temp 98.0°F | Ht 62.0 in | Wt 181.1 lb

## 2014-10-15 DIAGNOSIS — IMO0002 Reserved for concepts with insufficient information to code with codable children: Secondary | ICD-10-CM

## 2014-10-15 DIAGNOSIS — E1140 Type 2 diabetes mellitus with diabetic neuropathy, unspecified: Secondary | ICD-10-CM

## 2014-10-15 DIAGNOSIS — E1165 Type 2 diabetes mellitus with hyperglycemia: Secondary | ICD-10-CM

## 2014-10-15 DIAGNOSIS — I10 Essential (primary) hypertension: Secondary | ICD-10-CM

## 2014-10-15 MED ORDER — GABAPENTIN 100 MG PO CAPS: 200.0000 mg | ORAL_CAPSULE | Freq: Three times a day (TID) | ORAL | Status: DC

## 2014-10-15 NOTE — Assessment & Plan Note (Signed)
Worse. Present across anterior and posterior trunk. Uncomfortable to wear a bra. Will increase gabapentin to 200 mg TID. Follow up in 2 weeks for re-evaluation.

## 2014-10-15 NOTE — Assessment & Plan Note (Signed)
Endorses stable blood sugars, however she is checking them only once daily despite repetitive instructions to check three times daily. Currently managed on Humalog and Levemir. Will not change doses today. Poor diet continues. Denies dizziness, weakness in office today. She has an appointment to follow with endocrinology later this month for evaluation.

## 2014-10-15 NOTE — Patient Instructions (Signed)
Continue taking your insulin as discussed. You will take your Humalog 14 units at lunch and dinner. You will take your Levemir 20 units at bedtime.  Please check your sugars in the morning, at lunch, and at dinner.  Your gabapentin has been increased. You will take 2 capsules by mouth three times daily for nerve pain.  Follow up in 2 weeks for re-evaluation.  It was a pleasure to see you today!  Diabetic Neuropathy Diabetic neuropathy is a nerve disease or nerve damage that is caused by diabetes mellitus. About half of all people with diabetes mellitus have some form of nerve damage. Nerve damage is more common in those who have had diabetes mellitus for many years and who generally have not had good control of their blood sugar (glucose) level. Diabetic neuropathy is a common complication of diabetes mellitus. There are three more common types of diabetic neuropathy and a fourth type that is less common and less understood:   Peripheral neuropathy--This is the most common type of diabetic neuropathy. It causes damage to the nerves of the feet and legs first and then eventually the hands and arms.The damage affects the ability to sense touch.  Autonomic neuropathy--This type causes damage to the autonomic nervous system, which controls the following functions:  Heartbeat.  Body temperature.  Blood pressure.  Urination.  Digestion.  Sweating.  Sexual function.  Focal neuropathy--Focal neuropathy can be painful and unpredictable and occurs most often in older adults with diabetes mellitus. It involves a specific nerve or one area and often comes on suddenly. It usually does not cause long-term problems.  Radiculoplexus neuropathy-- Sometimes called lumbosacral radiculoplexus neuropathy, radiculoplexus neuropathy affects the nerves of the thighs, hips, buttocks, or legs. It is more common in people with type 2 diabetes mellitus and in older men. It is characterized by debilitating  pain, weakness, and atrophy, usually in the thigh muscles. CAUSES  The cause of peripheral, autonomic, and focal neuropathies is diabetes mellitus that is uncontrolled and high glucose levels. The cause of radiculoplexus neuropathy is unknown. However, it is thought to be caused by inflammation related to uncontrolled glucose levels. SIGNS AND SYMPTOMS  Peripheral Neuropathy Peripheral neuropathy develops slowly over time. When the nerves of the feet and legs no longer work there may be:   Burning, stabbing, or aching pain in the legs or feet.  Inability to feel pressure or pain in your feet. This can lead to:  Thick calluses over pressure areas.  Pressure sores.  Ulcers.  Foot deformities.  Reduced ability to feel temperature changes.  Muscle weakness. Autonomic Neuropathy The symptoms of autonomic neuropathy vary depending on which nerves are affected. Symptoms may include:  Problems with digestion, such as:  Feeling sick to your stomach (nausea).  Vomiting.  Bloating.  Constipation.  Diarrhea.  Abdominal pain.  Difficulty with urination. This occurs if you lose your ability to sense when your bladder is full. Problems include:  Urine leakage (incontinence).  Inability to empty your bladder completely (retention).  Rapid or irregular heartbeat (palpitations).  Blood pressure drops when you stand up (orthostatic hypotension). When you stand up you may feel:  Dizzy.  Weak.  Faint.  In men, inability to attain and maintain an erection.  In women, vaginal dryness and problems with decreased sexual desire and arousal.  Problems with body temperature regulation.  Increased or decreased sweating. Focal Neuropathy  Abnormal eye movements or abnormal alignment of both eyes.  Weakness in the wrist.  Foot drop. This results  in an inability to lift the foot properly and abnormal walking or foot movement.  Paralysis on one side of your face (Bell  palsy).  Chest or abdominal pain. Radiculoplexus Neuropathy  Sudden, severe pain in your hip, thigh, or buttocks.  Weakness and wasting of thigh muscles.  Difficulty rising from a seated position.  Abdominal swelling.  Unexplained weight loss (usually more than 10 lb [4.5 kg]). DIAGNOSIS  Peripheral Neuropathy Your senses may be tested. Sensory function testing can be done with:  A light touch using a monofilament.  A vibration with tuning fork.  A sharp sensation with a pin prick. Other tests that can help diagnose neuropathy are:  Nerve conduction velocity. This test checks the transmission of an electrical current through a nerve.  Electromyography. This shows how muscles respond to electrical signals transmitted by nearby nerves.  Quantitative sensory testing. This is used to assess how your nerves respond to vibrations and changes in temperature. Autonomic Neuropathy Diagnosis is often based on reported symptoms. Tell your health care provider if you experience:   Dizziness.   Constipation.   Diarrhea.   Inappropriate urination or inability to urinate.   Inability to get or maintain an erection.  Tests that may be done include:   Electrocardiography or Holter monitor. These are tests that can help show problems with the heart rate or heart rhythm.   An X-ray exam may be done. Focal Neuropathy Diagnosis is made based on your symptoms and what your health care provider finds during your exam. Other tests may be done. They may include:  Nerve conduction velocities. This checks the transmission of electrical current through a nerve.  Electromyography. This shows how muscles respond to electrical signals transmitted by nearby nerves.  Quantitative sensory testing. This test is used to assess how your nerves respond to vibration and changes in temperature. Radiculoplexus Neuropathy  Often the first thing is to eliminate any other issue or problems that  might be the cause, as there is no stick test for diagnosis.  X-ray exam of your spine and lumbar region.  Spinal tap to rule out cancer.  MRI to rule out other lesions. TREATMENT  Once nerve damage occurs, it cannot be reversed. The goal of treatment is to keep the disease or nerve damage from getting worse and affecting more nerve fibers. Controlling your blood glucose level is the key. Most people with radiculoplexus neuropathy see at least a partial improvement over time. You will need to keep your blood glucose and HbA1c levels in the target range determined by your health care provider. Things that help control blood glucose levels include:   Blood glucose monitoring.   Meal planning.   Physical activity.   Diabetes medicine.  Over time, maintaining lower blood glucose levels helps lessen symptoms. Sometimes, prescription pain medicine is needed. HOME CARE INSTRUCTIONS:  Do not smoke.  Keep your blood glucose level in the range that you and your health care provider have determined acceptable for you.  Keep your blood pressure level in the range that you and your health care provider have determined acceptable for you.  Eat a well-balanced diet.  Be active every day.  Check your feet every day. SEEK MEDICAL CARE IF:   You have burning, stabbing, or aching pain in the legs or feet.  You are unable to feel pressure or pain in your feet.  You develop problems with digestion such as:  Nausea.  Vomiting.  Bloating.  Constipation.  Diarrhea.  Abdominal pain.  You have difficulty with urination, such as:  Incontinence.  Retention.  You have palpitations.  You develop orthostatic hypotension. When you stand up you may feel:  Dizzy.  Weak.  Faint.  You cannot attain and maintain an erection (in men).  You have vaginal dryness and problems with decreased sexual desire and arousal (in women).  You have severe pain in your thighs, legs, or  buttocks.  You have unexplained weight loss. Document Released: 05/04/2001 Document Revised: 12/14/2012 Document Reviewed: 08/04/2012 [facility] Patient Information 2015 [place], [place]. This information is not intended to replace advice given to you by your health care provider. Make sure you discuss any questions you have with your health care provider.

## 2014-10-15 NOTE — Assessment & Plan Note (Signed)
Stable in office today. She has been out of her lisinopril, explained that she has refills available at her pharmacy and that she just needs to call. Continue low dose ACE and low dose HCTZ.

## 2014-10-15 NOTE — Progress Notes (Signed)
Pre visit review using our clinic review tool, if applicable. No additional management support is needed unless otherwise documented below in the visit note. 

## 2014-10-15 NOTE — Progress Notes (Signed)
Subjective:    Patient ID: Rachel Gonzalez, female    DOB: 06/23/1950, 64 y.o.   MRN: [id number]  HPI  Rachel Gonzalez is a 64 year old female who presents today for follow up of diabetes. She was evaluated two weeks ago in our office for follow up and was determined to have a blood sugar in the 40's. She is managed on Levemir 20 units at bedtime and Humalog 14 units at lunch and dinner. She is not to take her Humalog in the mornings. Last visit she took her Humalog that morning.  Since her last visit her sugars have been normal per patient. She is checking her sugars every morning and has been getting:  Today: 145.  Saturday: 89 Last week: 115's.  She endorses taking her Humalog at lunch and dinner (14 units), and Levemir 20 units at bedtime. She does not check her insulin at lunch or in the evenings, "only when I feel different".   2) Diabetic Neuropathy: Present to left lateral trunk, bilateral shoulder blades, and left and right upper abdomen. She describes her pain as sore and tender. She cannot wear her bra as this causes pain. She describes her pain as sharp and shooting and has become worse over the past several weeks.   Review of Systems  Constitutional: Negative for fever and chills.  Respiratory: Negative for shortness of breath.   Cardiovascular: Negative for chest pain.  Gastrointestinal: Positive for abdominal pain. Negative for nausea, vomiting, diarrhea, constipation and blood in stool.  Skin: Negative for rash.       Burning to skin over left lateral trunk and LUQ with radiation to RUQ  Neurological: Negative for dizziness.       Past Medical History  Diagnosis Date  . Diabetes mellitus   . Hypertension   . Anemia     History   Social History  . Marital Status: Married    Spouse Name: N/A  . Number of Children: N/A  . Years of Education: N/A   Occupational History  . Not on file.   Social History Main Topics  . Smoking status: Never Smoker   . Smokeless  tobacco: Not on file  . Alcohol Use: No  . Drug Use: No  . Sexual Activity: Not on file   Other Topics Concern  . Not on file   Social History Narrative   From [place].   Married.   Three children, 9 grandchildren.   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   Travels to [place], [place]       Past Surgical History  Procedure Laterality Date  . Arm surgery     . Cesarean section    . I&d extremity Right 05/08/2014    Procedure: IRRIGATION AND DEBRIDEMENT FOOT;  Surgeon: Nadara Mustard, MD;  Location: [facility] OR;  Service: Orthopedics;  Laterality: Right;  . Amputation Right 05/08/2014    Procedure: AMPUTATION RAY, right great toe;  Surgeon: Nadara Mustard, MD;  Location: [facility] OR;  Service: Orthopedics;  Laterality: Right;    Family History  Problem Relation Age of Onset  . Diabetes Mother     Allergies  Allergen Reactions  . Tramadol Nausea And Vomiting    Current Outpatient Prescriptions on File Prior to Visit  Medication Sig Dispense Refill  . cefpodoxime (VANTIN) 200 MG tablet Take 1 tablet (200 mg total) by mouth 2 (two) times daily. 4 tablet 0  . docusate  sodium (COLACE) 100 MG capsule Take 1 capsule (100 mg total) by mouth 2 (two) times daily. 10 capsule 0  . ferrous sulfate 325 (65 FE) MG tablet Take 1 tablet (325 mg total) by mouth daily with breakfast.  3  . gabapentin (NEURONTIN) 100 MG capsule Take 1 capsule (100 mg total) by mouth 2 (two) times daily. 60 capsule 5  . hydrochlorothiazide (HYDRODIURIL) 12.5 MG tablet Take 1 tablet (12.5 mg total) by mouth daily. 30 tablet 5  . insulin detemir (LEVEMIR) 100 UNIT/ML injection Inject 0.15 mLs (15 Units total) into the skin 2 (two) times daily. 10 mL 11  . insulin lispro (HUMALOG KWIKPEN) 100 UNIT/ML KiwkPen Inject 14 units subcutaneously with lunch and dinner.  Hold if blood sugar is below 110. 15 mL 11  . lisinopril (PRINIVIL,ZESTRIL) 2.5 MG tablet Take 1 tablet (2.5 mg total) by  mouth daily. 30 tablet 5  . ondansetron (ZOFRAN ODT) 4 MG disintegrating tablet Take 1 tablet (4 mg total) by mouth every 8 (eight) hours as needed for nausea or vomiting. 15 tablet 0  . oxyCODONE-acetaminophen (PERCOCET/ROXICET) 5-325 MG per tablet Take 1 tablet by mouth every 8 (eight) hours as needed for severe pain. 15 tablet 0  . pantoprazole (PROTONIX) 20 MG tablet Take 1 tablet (20 mg total) by mouth daily. 30 tablet 5  . polyethylene glycol (MIRALAX / GLYCOLAX) packet Take 17 g by mouth 2 (two) times daily. Take 17g PO BID until you have daily soft bowel movements, then you may decrease dose to 17g PO QD 14 each 0  . triamcinolone cream (KENALOG) 0.1 % Apply 1 application topically 2 (two) times daily as needed (for rash). 30 g 0   No current facility-administered medications on file prior to visit.    BP 132/72 mmHg  Pulse 78  Temp(Src) 98 F (36.7 C) (Oral)  Ht 5\' 2"  (1.575 m)  Wt 181 lb 1.9 oz (82.155 kg)  BMI 33.12 kg/m2  SpO2 99%    Objective:   Physical Exam  Constitutional: She appears well-nourished.  Cardiovascular: Normal rate and regular rhythm.   Pulmonary/Chest: Effort normal and breath sounds normal.  Abdominal: Soft. Bowel sounds are normal. There is tenderness in the left upper quadrant and left lower quadrant. There is no rebound, no tenderness at McBurney's point and negative Murphy's sign.  Skin: Skin is warm and dry. No rash noted.  Tender upon palpation to left lateral trunk and posterior trunk.  Psychiatric: She has a normal mood and affect.          Assessment & Plan:

## 2014-10-18 ENCOUNTER — Telehealth: Payer: Self-pay

## 2014-10-18 NOTE — Telephone Encounter (Signed)
Rachel Gonzalez with [facility] left v/m; Rachel Gonzalez received paperwork signed by Mayra Reel NP; for insurance purposes need MD signature. Rachel Gonzalez will resend paperwork but request cb with which MD to send to. Do not see copy of paperwork under Media tab. Spoke with Charlette Caffey and advised that Mayra Reel NP is pts PCP but supervised by Dr Kerby Nora. Rachel Gonzalez will refax request to Terese Door CMA at [phone].

## 2014-10-25 ENCOUNTER — Telehealth: Payer: Self-pay | Admitting: Primary Care

## 2014-10-25 ENCOUNTER — Ambulatory Visit: Payer: 59 | Admitting: Endocrinology

## 2014-10-25 DIAGNOSIS — E1140 Type 2 diabetes mellitus with diabetic neuropathy, unspecified: Secondary | ICD-10-CM

## 2014-10-25 DIAGNOSIS — Z0289 Encounter for other administrative examinations: Secondary | ICD-10-CM

## 2014-10-25 MED ORDER — GABAPENTIN 300 MG PO CAPS: 300.0000 mg | ORAL_CAPSULE | Freq: Three times a day (TID) | ORAL | Status: DC

## 2014-10-25 NOTE — Telephone Encounter (Signed)
Suspect the side pain she is referring to is the neuropathy that we are trying to treat. Will increase gabapentin to 300 mg TID. Johny Drilling, will you notify Ms. Brassfield that will send a new prescription to her pharmacy? She will take 1 capsule by mouth three times daily. Thanks.

## 2014-10-25 NOTE — Telephone Encounter (Signed)
What are her blood sugars running? Is she taking her insulin as directed? Also, is she noticing a difference since we increased the gabapentin? Thanks.

## 2014-10-25 NOTE — Telephone Encounter (Signed)
Pt called in a lot of pain with side, it's not getting any better and would like to know if Jae Dire could do anything before Monday for her. She would like a call back if possible.

## 2014-10-25 NOTE — Telephone Encounter (Signed)
Called and notified patient of Rachel Gonzalez's comments. Patient verbalized understanding.  

## 2014-10-25 NOTE — Telephone Encounter (Signed)
Called patient. Asked patient how her sugar is running. Patient stated 110 to 120. Yes, patient stated she is taking her insulin as directed. She did not noticed any difference with the increased of gabapentin. Her last bowel movement was last night. There has not been in dark or bloody stool. No bloating. Patient stated that still in pain on the left side.

## 2014-10-29 ENCOUNTER — Telehealth: Payer: Self-pay | Admitting: Primary Care

## 2014-10-29 ENCOUNTER — Ambulatory Visit: Payer: 59 | Admitting: Primary Care

## 2014-10-29 NOTE — Telephone Encounter (Signed)
Pt did not come in for their appt today for 2 week follow up. Please let me know if pt needs to be contacted immediately for follow up or no follow up needed. Best phone number to contact pt is [phone].

## 2014-10-29 NOTE — Telephone Encounter (Signed)
Yes, please reschedule at her convienece within the next 1-2 weeks. Thank you!

## 2014-10-29 NOTE — Telephone Encounter (Signed)
Yes, please schedule at her convienece within the next 1-2 weeks.

## 2014-11-01 ENCOUNTER — Telehealth: Payer: Self-pay

## 2014-11-01 NOTE — Telephone Encounter (Signed)
Pt left v/m requesting cb; spoke with pt and she wants med refilled; when asked name of med pt did not know; was on hold for several mins and then I asked pt to ck on name of med needed and cb. Pt voiced understanding.

## 2014-11-02 NOTE — Telephone Encounter (Signed)
Left message for pt to call back and reschedule appt

## 2014-11-07 ENCOUNTER — Telehealth: Payer: Self-pay | Admitting: Primary Care

## 2014-11-07 ENCOUNTER — Other Ambulatory Visit: Payer: Self-pay | Admitting: Primary Care

## 2014-11-07 ENCOUNTER — Encounter: Payer: Self-pay | Admitting: Primary Care

## 2014-11-07 ENCOUNTER — Ambulatory Visit (INDEPENDENT_AMBULATORY_CARE_PROVIDER_SITE_OTHER): Payer: Self-pay | Admitting: Primary Care

## 2014-11-07 VITALS — BP 156/86 | HR 109 | Temp 98.0°F | Ht 64.0 in | Wt 178.0 lb

## 2014-11-07 DIAGNOSIS — E1140 Type 2 diabetes mellitus with diabetic neuropathy, unspecified: Secondary | ICD-10-CM

## 2014-11-07 DIAGNOSIS — E1165 Type 2 diabetes mellitus with hyperglycemia: Secondary | ICD-10-CM

## 2014-11-07 DIAGNOSIS — IMO0002 Reserved for concepts with insufficient information to code with codable children: Secondary | ICD-10-CM

## 2014-11-07 DIAGNOSIS — R21 Rash and other nonspecific skin eruption: Secondary | ICD-10-CM

## 2014-11-07 LAB — BASIC METABOLIC PANEL
BUN: 18 mg/dL (ref 6–23)
CO2: 25 meq/L (ref 19–32)
CREATININE: 1.12 mg/dL (ref 0.40–1.20)
Calcium: 9.5 mg/dL (ref 8.4–10.5)
Chloride: 97 mEq/L (ref 96–112)
GFR: 63.19 mL/min (ref >60.00)
Glucose, Bld: 580 mg/dL (ref 70–99)
POTASSIUM: 4.1 meq/L (ref 3.5–5.1)
Sodium: 133 mEq/L — ABNORMAL LOW (ref 135–145)

## 2014-11-07 MED ORDER — INSULIN NPH (HUMAN) (ISOPHANE) 100 UNIT/ML ~~LOC~~ SUSP: 8.0000 [IU] | Freq: Two times a day (BID) | SUBCUTANEOUS | Status: DC

## 2014-11-07 MED ORDER — NYSTATIN 100000 UNIT/GM EX CREA: 1.0000 "application " | TOPICAL_CREAM | Freq: Two times a day (BID) | CUTANEOUS | Status: DC

## 2014-11-07 MED ORDER — DOXYCYCLINE HYCLATE 100 MG PO TABS: 100.0000 mg | ORAL_TABLET | Freq: Two times a day (BID) | ORAL | Status: DC

## 2014-11-07 NOTE — Progress Notes (Signed)
Subjective:    Patient ID: Rachel Gonzalez, female    DOB: 09-06-1950, 64 y.o.   MRN: [id number]  HPI  Rachel Gonzalez is a 64 year old female who presents today with a chief complaint of rash.  Her rash is located underneath bilateral breasts and has been present for several weeks. Her rash is painful with redness, and describes her pain as sharp and tender. Denies fevers, skin breakdown, drainage, but has not been able to sleep for the past 2 nights.   2) Diabetes Type 2: Currently managed on Humalog 14 units at lunch and dinner; and Levemir 20 units at bedtime. She was provided with a prescription for Gabapentin 300 mg three times daily for diabetic neuropathy. She has not checked her sugars in 2 weeks due to a recent move. Her meter is packed in boxes. She is taking Levemir 20 units at bedtime and Humalog 14 units at lunch and dinner. She endorses that her sugars have been in the 90's during her last several checks prior to her move. She plans to use a spare meter that a family member will lend her.  Review of Systems  Constitutional: Negative for fever and fatigue.  Respiratory: Negative for shortness of breath.   Cardiovascular: Negative for chest pain and leg swelling.  Skin: Positive for rash.  Neurological: Negative for dizziness and numbness.       Diabetic neuropathy pain to right lower extremity.       Past Medical History  Diagnosis Date  . Diabetes mellitus   . Hypertension   . Anemia     Social History   Social History  . Marital Status: Married    Spouse Name: N/A  . Number of Children: N/A  . Years of Education: N/A   Occupational History  . Not on file.   Social History Main Topics  . Smoking status: Never Smoker   . Smokeless tobacco: Not on file  . Alcohol Use: No  . Drug Use: No  . Sexual Activity: Not on file   Other Topics Concern  . Not on file   Social History Narrative   From [place].   Married.   Three children, 9 grandchildren.   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   Travels to [place], [place]       Past Surgical History  Procedure Laterality Date  . Arm surgery     . Cesarean section    . I&d extremity Right 05/08/2014    Procedure: IRRIGATION AND DEBRIDEMENT FOOT;  Surgeon: Nadara Mustard, MD;  Location: [facility] OR;  Service: Orthopedics;  Laterality: Right;  . Amputation Right 05/08/2014    Procedure: AMPUTATION RAY, right great toe;  Surgeon: Nadara Mustard, MD;  Location: [facility] OR;  Service: Orthopedics;  Laterality: Right;    Family History  Problem Relation Age of Onset  . Diabetes Mother     Allergies  Allergen Reactions  . Tramadol Nausea And Vomiting    Current Outpatient Prescriptions on File Prior to Visit  Medication Sig Dispense Refill  . cefpodoxime (VANTIN) 200 MG tablet Take 1 tablet (200 mg total) by mouth 2 (two) times daily. 4 tablet 0  . docusate sodium (COLACE) 100 MG capsule Take 1 capsule (100 mg total) by mouth 2 (two) times daily. 10 capsule 0  . ferrous sulfate 325 (65 FE) MG tablet Take 1 tablet (325 mg total) by mouth daily with breakfast.  3  .  hydrochlorothiazide (HYDRODIURIL) 12.5 MG tablet Take 1 tablet (12.5 mg total) by mouth daily. 30 tablet 5  . insulin detemir (LEVEMIR) 100 UNIT/ML injection Inject 0.15 mLs (15 Units total) into the skin 2 (two) times daily. 10 mL 11  . insulin lispro (HUMALOG KWIKPEN) 100 UNIT/ML KiwkPen Inject 14 units subcutaneously with lunch and dinner.  Hold if blood sugar is below 110. 15 mL 11  . lisinopril (PRINIVIL,ZESTRIL) 2.5 MG tablet Take 1 tablet (2.5 mg total) by mouth daily. 30 tablet 5  . ondansetron (ZOFRAN ODT) 4 MG disintegrating tablet Take 1 tablet (4 mg total) by mouth every 8 (eight) hours as needed for nausea or vomiting. 15 tablet 0  . oxyCODONE-acetaminophen (PERCOCET/ROXICET) 5-325 MG per tablet Take 1 tablet by mouth every 8 (eight) hours as needed for severe pain. 15 tablet 0  .  pantoprazole (PROTONIX) 20 MG tablet Take 1 tablet (20 mg total) by mouth daily. 30 tablet 5  . polyethylene glycol (MIRALAX / GLYCOLAX) packet Take 17 g by mouth 2 (two) times daily. Take 17g PO BID until you have daily soft bowel movements, then you may decrease dose to 17g PO QD 14 each 0  . triamcinolone cream (KENALOG) 0.1 % Apply 1 application topically 2 (two) times daily as needed (for rash). 30 g 0  . gabapentin (NEURONTIN) 300 MG capsule Take 1 capsule (300 mg total) by mouth 3 (three) times daily. (Patient not taking: Reported on 11/07/2014) 90 capsule 3   No current facility-administered medications on file prior to visit.    BP 156/86 mmHg  Pulse 109  Temp(Src) 98 F (36.7 C) (Oral)  Ht 5\' 4"  (1.626 m)  Wt 178 lb (80.74 kg)  BMI 30.54 kg/m2  SpO2 99%    Objective:   Physical Exam  Constitutional: She appears well-nourished.  Cardiovascular: Normal rate and regular rhythm.   Pulmonary/Chest: Effort normal and breath sounds normal.  Skin: Skin is dry. Rash noted.  Mild-moderate redness underneath bilateral breasts which are very tender upon palpation. No breakdown, no drainage.           Assessment & Plan:  Rash:  Present bilaterally, does not appear to be shingles Appears fungal. No breakdown, very tender upon light palpation. Suspect this could have some bacterial involvement due to extreme tenderness. RX for Nystatin cream BID and Doxycycline BID (due to suspicion of bacterial involvement). Follow up PRN.

## 2014-11-07 NOTE — Telephone Encounter (Signed)
Spoke with both Rachel Gonzalez and daughter. RX for NPH sent into pharmacy in [place]. 8 units twice daily before meals. Discussed the importance of checking blood sugars, especially when taking insulin. She cannot afford the Levemir anymore and has not had in several days, this was discontinued.  Will call to check on sugars tomorrow.

## 2014-11-07 NOTE — Assessment & Plan Note (Signed)
Patient could not pick up script for gabapentin 300 mg and has not taken her 100 mg. Will have her take 200 mg twice daily until she's able to pick up the 300 mg tablets. Suspect this is causing her pain under breasts to be more intense. Discussed how to take her gabapentin, daughter present during this appointment.

## 2014-11-07 NOTE — Assessment & Plan Note (Signed)
Has not checked sugars in 2 weeks due to recent move. Endorses sugars running in the 90's prior to her move. She continues to take her Levemir 20 units at bedtime and Humalog 14 units at lunch and dinner. Denies weakness, dizziness, fatigue.  BMP today, Discussed the importance of checking her sugars. She plans to borrow a family member's meter that is no longer being used. Follow up with endocrinology as scheduled. Follow up with me in 1 month.

## 2014-11-07 NOTE — Patient Instructions (Addendum)
Start Doxycycline antibiotics. Take 1 tablet by mouth twice daily for 7 days.  Apply Nystatin cream twice daily under breasts.  Continue taking your Gabapentin at home. Take 2 capsules by mouth twice daily for nerve pain. You should be able to get the Gabapentin 300 mg tablets next week. You will take 1 tablet by mouth three times daily.   Complete lab work prior to leaving today. I will notify you of your results.  Please schedule a follow up appointment in 1 month.  It was a pleasure to see you today!

## 2014-11-14 ENCOUNTER — Other Ambulatory Visit: Payer: Self-pay

## 2014-11-14 DIAGNOSIS — R21 Rash and other nonspecific skin eruption: Secondary | ICD-10-CM

## 2014-11-14 MED ORDER — NYSTATIN 100000 UNIT/GM EX CREA: 1.0000 "application " | TOPICAL_CREAM | Freq: Two times a day (BID) | CUTANEOUS | Status: DC

## 2014-11-14 NOTE — Telephone Encounter (Signed)
Pt said still has redness under lt breast(redness is much better) and request refill of nystatin cream to [facility] [place].Please advise. Pt is out of med that was picked up on 11/07/14. Mayra Reel NP out of office.

## 2014-11-26 ENCOUNTER — Encounter (HOSPITAL_COMMUNITY): Payer: Self-pay | Admitting: *Deleted

## 2014-11-26 ENCOUNTER — Emergency Department (HOSPITAL_COMMUNITY)
Admission: EM | Admit: 2014-11-26 | Discharge: 2014-11-26 | Disposition: A | Discharge status: Home / Self Care | Payer: Medicaid Other | Attending: Emergency Medicine | Admitting: Emergency Medicine

## 2014-11-26 ENCOUNTER — Emergency Department (HOSPITAL_COMMUNITY): Payer: Medicaid Other

## 2014-11-26 DIAGNOSIS — E119 Type 2 diabetes mellitus without complications: Secondary | ICD-10-CM | POA: Insufficient documentation

## 2014-11-26 DIAGNOSIS — Z79899 Other long term (current) drug therapy: Secondary | ICD-10-CM | POA: Insufficient documentation

## 2014-11-26 DIAGNOSIS — R109 Unspecified abdominal pain: Secondary | ICD-10-CM

## 2014-11-26 DIAGNOSIS — I10 Essential (primary) hypertension: Secondary | ICD-10-CM | POA: Diagnosis not present

## 2014-11-26 DIAGNOSIS — R1084 Generalized abdominal pain: Secondary | ICD-10-CM | POA: Diagnosis not present

## 2014-11-26 DIAGNOSIS — Z87448 Personal history of other diseases of urinary system: Secondary | ICD-10-CM | POA: Insufficient documentation

## 2014-11-26 DIAGNOSIS — R1012 Left upper quadrant pain: Secondary | ICD-10-CM

## 2014-11-26 DIAGNOSIS — D649 Anemia, unspecified: Secondary | ICD-10-CM | POA: Insufficient documentation

## 2014-11-26 DIAGNOSIS — Z794 Long term (current) use of insulin: Secondary | ICD-10-CM | POA: Insufficient documentation

## 2014-11-26 HISTORY — DX: Disorder of kidney and ureter, unspecified: N28.9

## 2014-11-26 LAB — COMPREHENSIVE METABOLIC PANEL
ALK PHOS: 119 U/L (ref 38–126)
ALT: 10 U/L — AB (ref 14–54)
AST: 17 U/L (ref 15–41)
Albumin: 3.4 g/dL — ABNORMAL LOW (ref 3.5–5.0)
Anion gap: 8 (ref 5–15)
BILIRUBIN TOTAL: 0.6 mg/dL (ref 0.3–1.2)
BUN: 29 mg/dL — AB (ref 6–20)
CALCIUM: 9.4 mg/dL (ref 8.9–10.3)
CHLORIDE: 105 mmol/L (ref 101–111)
CO2: 26 mmol/L (ref 22–32)
CREATININE: 1.36 mg/dL — AB (ref 0.44–1.00)
GFR calc Af Amer: 47 mL/min — ABNORMAL LOW (ref >60)
GFR, EST NON AFRICAN AMERICAN: 40 mL/min — AB (ref >60)
Glucose, Bld: 266 mg/dL — ABNORMAL HIGH (ref 65–99)
Potassium: 4.6 mmol/L (ref 3.5–5.1)
Sodium: 139 mmol/L (ref 135–145)
Total Protein: 6.6 g/dL (ref 6.5–8.1)

## 2014-11-26 LAB — URINALYSIS, ROUTINE W REFLEX MICROSCOPIC
BILIRUBIN URINE: NEGATIVE
GLUCOSE, UA: 500 mg/dL — AB
KETONES UR: NEGATIVE mg/dL
Leukocytes, UA: NEGATIVE
NITRITE: NEGATIVE
PH: 5 (ref 5.0–8.0)
Protein, ur: 30 mg/dL — AB
Specific Gravity, Urine: 1.02 (ref 1.005–1.030)
Urobilinogen, UA: 0.2 mg/dL (ref 0.0–1.0)

## 2014-11-26 LAB — CBC
HCT: 34.2 % — ABNORMAL LOW (ref 36.0–46.0)
Hemoglobin: 11.1 g/dL — ABNORMAL LOW (ref 12.0–15.0)
MCH: 27.3 pg (ref 26.0–34.0)
MCHC: 32.5 g/dL (ref 30.0–36.0)
MCV: 84 fL (ref 78.0–100.0)
PLATELETS: 194 10*3/uL (ref 150–400)
RBC: 4.07 MIL/uL (ref 3.87–5.11)
RDW: 12.5 % (ref 11.5–15.5)
WBC: 4.4 10*3/uL (ref 4.0–10.5)

## 2014-11-26 LAB — URINE MICROSCOPIC-ADD ON

## 2014-11-26 LAB — LIPASE, BLOOD: LIPASE: 17 U/L — AB (ref 22–51)

## 2014-11-26 MED ORDER — ACETAMINOPHEN 325 MG PO TABS
650.0000 mg | ORAL_TABLET | Freq: Once | ORAL | Status: AC
  Administered 2014-11-26: 650 mg via ORAL
  Filled 2014-11-26: qty 2

## 2014-11-26 MED ORDER — PANTOPRAZOLE SODIUM 40 MG IV SOLR
40.0000 mg | Freq: Once | INTRAVENOUS | Status: DC
  Filled 2014-11-26: qty 40

## 2014-11-26 MED ORDER — FENTANYL CITRATE (PF) 100 MCG/2ML IJ SOLN
INTRAMUSCULAR | Status: AC
  Filled 2014-11-26: qty 2

## 2014-11-26 MED ORDER — PANTOPRAZOLE SODIUM 40 MG PO TBEC
40.0000 mg | DELAYED_RELEASE_TABLET | Freq: Once | ORAL | Status: AC
  Administered 2014-11-26: 40 mg via ORAL
  Filled 2014-11-26: qty 1

## 2014-11-26 MED ORDER — FENTANYL CITRATE (PF) 100 MCG/2ML IJ SOLN
50.0000 ug | Freq: Once | INTRAMUSCULAR | Status: AC
  Administered 2014-11-26: 50 ug via NASAL

## 2014-11-26 MED ORDER — RANITIDINE HCL 150 MG/10ML PO SYRP
150.0000 mg | ORAL_SOLUTION | Freq: Once | ORAL | Status: AC
  Administered 2014-11-26: 150 mg via ORAL
  Filled 2014-11-26: qty 10

## 2014-11-26 MED ORDER — ACETAMINOPHEN 500 MG PO TABS: 500.0000 mg | ORAL_TABLET | ORAL | Status: DC | PRN

## 2014-11-26 MED ORDER — GI COCKTAIL ~~LOC~~
30.0000 mL | Freq: Once | ORAL | Status: AC
  Administered 2014-11-26: 30 mL via ORAL
  Filled 2014-11-26: qty 30

## 2014-11-26 NOTE — ED Notes (Signed)
Pt acknowledges taking all her belongings home with her.

## 2014-11-26 NOTE — ED Provider Notes (Signed)
CSN: [id number]     Arrival date & time 11/26/14  4098 History   First MD Initiated Contact with Patient 11/26/14 1703     Chief Complaint  Patient presents with  . Abdominal Pain     (Consider location/radiation/quality/duration/timing/severity/associated sxs/prior Treatment) HPI   Rachel Gonzalez is a 64 y.o. female with PMH significant for DM, anemia, renal disorder who presents with 7 mo history of intermittent, stabbing, radiating (epigastric, RUQ, and back) LUQ pain.  She states this is her 3rd ED visit for this problem and she reports it has gotten worse since then.  Previous workups have been unremarkable with the exception of increased stool burden on plain films (09/13/14) and CT (08/30/14).  Alleviating factors include pressure.  Denies, HA, fevers, chills, N/V/D, polydipsia, polyuria, CP, SOB, or changes in bowel habits.  She states she is tolerating PO intake.  She has also run out of her pantoprazole.  Her abdominal pain does not seem to be associated with food.    Past Medical History  Diagnosis Date  . Diabetes mellitus   . Hypertension   . Anemia   . Renal disorder    Past Surgical History  Procedure Laterality Date  . Arm surgery     . Cesarean section    . I&d extremity Right 05/08/2014    Procedure: IRRIGATION AND DEBRIDEMENT FOOT;  Surgeon: Nadara Mustard, MD;  Location: [facility] OR;  Service: Orthopedics;  Laterality: Right;  . Amputation Right 05/08/2014    Procedure: AMPUTATION RAY, right great toe;  Surgeon: Nadara Mustard, MD;  Location: [facility] OR;  Service: Orthopedics;  Laterality: Right;   Family History  Problem Relation Age of Onset  . Diabetes Mother    Social History  Substance Use Topics  . Smoking status: Never Smoker   . Smokeless tobacco: None  . Alcohol Use: No   OB History    No data available     Review of Systems All other systems negative unless otherwise stated in HPI   Allergies  Tramadol  Home Medications   Prior to Admission medications    Medication Sig Start Date End Date Taking? Authorizing Provider  cefpodoxime (VANTIN) 200 MG tablet Take 1 tablet (200 mg total) by mouth 2 (two) times daily. 09/15/14  Yes Leroy Sea, MD  docusate sodium (COLACE) 100 MG capsule Take 1 capsule (100 mg total) by mouth 2 (two) times daily. 05/16/14  Yes Leroy Sea, MD  ferrous sulfate 325 (65 FE) MG tablet Take 1 tablet (325 mg total) by mouth daily with breakfast. 05/16/14  Yes Leroy Sea, MD  hydrochlorothiazide (HYDRODIURIL) 12.5 MG tablet Take 1 tablet (12.5 mg total) by mouth daily. 10/01/14  Yes Doreene Nest, NP  insulin lispro (HUMALOG KWIKPEN) 100 UNIT/ML KiwkPen Inject 14 units subcutaneously with lunch and dinner.  Hold if blood sugar is below 110. 07/30/14  Yes Doreene Nest, NP  insulin NPH Human (HUMULIN N) 100 UNIT/ML injection Inject 0.08 mLs (8 Units total) into the skin 2 (two) times daily before a meal. 11/07/14  Yes Doreene Nest, NP  lisinopril (PRINIVIL,ZESTRIL) 2.5 MG tablet Take 1 tablet (2.5 mg total) by mouth daily. 08/14/14  Yes Doreene Nest, NP  polyethylene glycol (MIRALAX / GLYCOLAX) packet Take 17 g by mouth 2 (two) times daily. Take 17g PO BID until you have daily soft bowel movements, then you may decrease dose to 17g PO QD 08/31/14  Yes Mercedes Camprubi-Soms, PA-C  triamcinolone  cream (KENALOG) 0.1 % Apply 1 application topically 2 (two) times daily as needed (for rash). 10/01/14  Yes Doreene Nest, NP  gabapentin (NEURONTIN) 300 MG capsule Take 1 capsule (300 mg total) by mouth 3 (three) times daily. Patient not taking: Reported on 11/07/2014 10/25/14   Doreene Nest, NP  ondansetron (ZOFRAN ODT) 4 MG disintegrating tablet Take 1 tablet (4 mg total) by mouth every 8 (eight) hours as needed for nausea or vomiting. Patient not taking: Reported on 11/26/2014 06/03/14   Mercedes Camprubi-Soms, PA-C  oxyCODONE-acetaminophen (PERCOCET/ROXICET) 5-325 MG per tablet Take 1 tablet by mouth every 8  (eight) hours as needed for severe pain. Patient not taking: Reported on 11/26/2014 05/16/14   Leroy Sea, MD  pantoprazole (PROTONIX) 20 MG tablet Take 1 tablet (20 mg total) by mouth daily. Patient not taking: Reported on 11/26/2014 10/01/14   Doreene Nest, NP   BP 168/85 mmHg  Pulse 90  Temp(Src) 98.4 F (36.9 C) (Oral)  Resp 14  SpO2 98% Physical Exam  Constitutional: She is oriented to person, place, and time. She appears well-developed and well-nourished.  HENT:  Head: Normocephalic and atraumatic.  Mouth/Throat: Oropharynx is clear and moist.  Eyes: Pupils are equal, round, and reactive to light.  Neck: Normal range of motion. Neck supple.  Cardiovascular: Normal rate and regular rhythm.   No murmur heard. Pulmonary/Chest: Effort normal and breath sounds normal. No respiratory distress. She has no wheezes. She has no rales.  Abdominal: Soft. Bowel sounds are normal. She exhibits no distension. There is generalized tenderness. There is no rebound and no guarding.  Musculoskeletal: Normal range of motion.  Lymphadenopathy:    She has no cervical adenopathy.  Neurological: She is alert and oriented to person, place, and time.  Skin: Skin is warm and dry.  Psychiatric: She has a normal mood and affect. Her behavior is normal.    ED Course  Procedures (including critical care time) Labs Review Labs Reviewed  LIPASE, BLOOD - Abnormal; Notable for the following:    Lipase 17 (*)    All other components within normal limits  COMPREHENSIVE METABOLIC PANEL - Abnormal; Notable for the following:    Glucose, Bld 266 (*)    BUN 29 (*)    Creatinine, Ser 1.36 (*)    Albumin 3.4 (*)    ALT 10 (*)    GFR calc non Af Amer 40 (*)    GFR calc Af Amer 47 (*)    All other components within normal limits  CBC - Abnormal; Notable for the following:    Hemoglobin 11.1 (*)    HCT 34.2 (*)    All other components within normal limits  URINALYSIS, ROUTINE W REFLEX MICROSCOPIC (NOT  AT [facility])    Imaging Review No results found. I have personally reviewed and evaluated these images and lab results as part of my medical decision-making.   EKG Interpretation None      MDM   Final diagnoses:  None    Patient presents with chronic LUQ intermittent stabbing radiating (epigastric/RUQ) abdominal pain.  VSS, patient appears nontoxic, NAD.  On exam, mild generalized abdominal tenderness.   Suspect LUQ pain.  Low suspicion for DKA or acute abdominal etiology.   Imaging acute abdominal series.  Labs include UA, lipase, CMP, and CBC.  Will give GI cocktail, pantoprazole, and ranitidine.  Likely d/c home with GI follow up. UA shows glucose.  CMP-glucose 266, Cr 1.36, anion gap 8.  Abdominal series  shows mild fecal retention over right colon, no obstruction.  7:00 PM: pt reports pain is unchanged after receiving GI cocktail, ranitidine, pantoprazole, and tylenol.   Suspect abdominal pain. Doubt emergent abdominal processes. Pt stable for d/c.  Advised to follow up with [facility], she will call to schedule an appointment tomorrow.  Discussed return precautions and supportive care.  Patient acknowledges and agrees with the above plan.       Cheri Fowler, PA-C 11/26/14 1926  Arby Barrette, MD 11/27/14 [phone]

## 2014-11-26 NOTE — Discharge Instructions (Signed)

## 2014-11-26 NOTE — ED Notes (Signed)
PT is here with left upper abdominal pain that radiates some around to back.  Pt complains her stomach burns.  States she has been seen for this before.  No nausea.  Pt is IDDM.

## 2014-11-29 ENCOUNTER — Ambulatory Visit: Payer: 59 | Admitting: Endocrinology

## 2014-12-05 ENCOUNTER — Telehealth: Payer: Self-pay | Admitting: Primary Care

## 2014-12-05 NOTE — Telephone Encounter (Signed)
Pt called to cancel appointment on Friday she stated you told her to ask for you when she called  Pt would not discuss what it was regarding only wanted to talk to you

## 2014-12-05 NOTE — Telephone Encounter (Signed)
Returned patients call, no answer, voicemail left.   

## 2014-12-07 ENCOUNTER — Ambulatory Visit: Payer: Self-pay | Admitting: Primary Care

## 2014-12-17 ENCOUNTER — Encounter: Payer: Self-pay | Admitting: Primary Care

## 2014-12-17 ENCOUNTER — Ambulatory Visit (INDEPENDENT_AMBULATORY_CARE_PROVIDER_SITE_OTHER): Payer: Medicaid Other | Admitting: Primary Care

## 2014-12-17 VITALS — BP 176/92 | HR 96 | Temp 97.5°F | Ht 64.0 in | Wt 184.1 lb

## 2014-12-17 DIAGNOSIS — L03115 Cellulitis of right lower limb: Secondary | ICD-10-CM | POA: Diagnosis not present

## 2014-12-17 DIAGNOSIS — E118 Type 2 diabetes mellitus with unspecified complications: Secondary | ICD-10-CM

## 2014-12-17 DIAGNOSIS — I10 Essential (primary) hypertension: Secondary | ICD-10-CM | POA: Diagnosis not present

## 2014-12-17 DIAGNOSIS — E1140 Type 2 diabetes mellitus with diabetic neuropathy, unspecified: Secondary | ICD-10-CM

## 2014-12-17 DIAGNOSIS — E1165 Type 2 diabetes mellitus with hyperglycemia: Secondary | ICD-10-CM

## 2014-12-17 DIAGNOSIS — Z794 Long term (current) use of insulin: Secondary | ICD-10-CM

## 2014-12-17 DIAGNOSIS — IMO0002 Reserved for concepts with insufficient information to code with codable children: Secondary | ICD-10-CM

## 2014-12-17 MED ORDER — HYDROCHLOROTHIAZIDE 12.5 MG PO TABS: 12.5000 mg | ORAL_TABLET | Freq: Every day | ORAL | Status: DC

## 2014-12-17 MED ORDER — LISINOPRIL 2.5 MG PO TABS: 2.5000 mg | ORAL_TABLET | Freq: Every day | ORAL | Status: DC

## 2014-12-17 MED ORDER — INSULIN NPH (HUMAN) (ISOPHANE) 100 UNIT/ML ~~LOC~~ SUSP: 10.0000 [IU] | Freq: Two times a day (BID) | SUBCUTANEOUS | Status: DC

## 2014-12-17 NOTE — Patient Instructions (Addendum)
Increase Novolin N insulin to 10 units twice daily. Do not use other insulins, only use this one. Do not use Levemir or Humalog until seen by another provider.  Continue checking your sugars twice daily. Call me if sugars are below 80 or above 300.  I have sent in refills to your pharmacy for the insulin (Novolin N), hydrochlorothiazide, lisinopril, gabapentin.   It was a pleasure to see you today!

## 2014-12-17 NOTE — Assessment & Plan Note (Signed)
Last month switched to Novolin N 8 units BID as she could no longer afford her Levemir and Humalog. Sugars running in 150-200 range. Will have her increase to 10 units BID and notify me if sugars are below 80 or above 300.

## 2014-12-17 NOTE — Progress Notes (Signed)
Pre visit review using our clinic review tool, if applicable. No additional management support is needed unless otherwise documented below in the visit note. 

## 2014-12-17 NOTE — Assessment & Plan Note (Signed)
Significantly improved. Boot removed several weeks ago. Skin looks great without any s/s of infection or irritation. She has follow up with podiatry today.

## 2014-12-17 NOTE — Progress Notes (Signed)
Subjective:    Patient ID: Rachel Gonzalez, female    DOB: 02/06/1951, 64 y.o.   MRN: [id number]  HPI  Rachel Gonzalez is a 64 year old female who presents today for follow up of chronic conditions. She also mentions that this is her last visit with [place] as she is going to Medicaid.  1) Type 2 Diabetes: Currently managed on Novilin Gonzalez 8 units twice daily. This was switched from Rachel Gonzalez and Rachel Gonzalez last visit as she could not afford these medications.  She has been out of Novlin Gonzalez since Wednesday last week and has been using the remaining Humalong since. Her sugars have been running in the 140-200 range. Denies dizziness.  2) Essential Hypertension: Currently managed on Rachel Gonzalez 12.5 mg and Rachel Gonzalez 2.5 mg. She hasn't had her medication in 2 weeks. BP elevated in clinic today. Denies chest pain, has had some headaches.  3) Diabetic Neuropathy: Currently managed on Rachel Gonzalez 300 mg three times daily. Overall she's had a reduction of pain to her lower extremities. She had her diabetic shoe removed recently and has follow up with podiatry this afternoon.  Review of Systems  Constitutional: Negative for fever.  Respiratory: Negative for shortness of breath.   Cardiovascular: Negative for chest pain.  Neurological: Positive for headaches. Negative for dizziness and numbness.       Past Medical History  Diagnosis Date  . Diabetes mellitus   . Hypertension   . Anemia   . Renal disorder     Social History   Social History  . Marital Status: Married    Spouse Name: Gonzalez/A  . Number of Children: Gonzalez/A  . Years of Education: Gonzalez/A   Occupational History  . Not on file.   Social History Main Topics  . Smoking status: Never Smoker   . Smokeless tobacco: Not on file  . Alcohol Use: No  . Drug Use: No  . Sexual Activity: Not on file   Other Topics Concern  . Not on file   Social History Narrative   From [place].   Married.   Three children, 9 grandchildren.   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   Travels to [place], [place]       Past Surgical History  Procedure Laterality Date  . Arm surgery     . Cesarean section    . I&d extremity Right 05/08/2014    Procedure: IRRIGATION AND DEBRIDEMENT FOOT;  Surgeon: Nadara Mustard, MD;  Location: [facility] OR;  Service: Orthopedics;  Laterality: Right;  . Amputation Right 05/08/2014    Procedure: AMPUTATION RAY, right great toe;  Surgeon: Nadara Mustard, MD;  Location: [facility] OR;  Service: Orthopedics;  Laterality: Right;    Family History  Problem Relation Age of Onset  . Diabetes Mother     Allergies  Allergen Reactions  . Tramadol Nausea And Vomiting    Current Outpatient Prescriptions on File Prior to Visit  Medication Sig Dispense Refill  . acetaminophen (TYLENOL) 500 MG tablet Take 1 tablet (500 mg total) by mouth every 4 (four) hours as needed. 21 tablet 0  . cefpodoxime (VANTIN) 200 MG tablet Take 1 tablet (200 mg total) by mouth 2 (two) times daily. 4 tablet 0  . docusate sodium (COLACE) 100 MG capsule Take 1 capsule (100 mg total) by mouth 2 (two) times daily. 10 capsule 0  . ferrous sulfate 325 (65 FE) MG tablet Take 1 tablet (325 mg  total) by mouth daily with breakfast.  3  . polyethylene glycol (MIRALAX / GLYCOLAX) packet Take 17 g by mouth 2 (two) times daily. Take 17g PO BID until you have daily soft bowel movements, then you may decrease dose to 17g PO QD 14 each 0  . triamcinolone cream (KENALOG) 0.1 % Apply 1 application topically 2 (two) times daily as needed (for rash). 30 g 0  . Rachel Gonzalez (NEURONTIN) 300 MG capsule Take 1 capsule (300 mg total) by mouth 3 (three) times daily. (Patient not taking: Reported on 11/07/2014) 90 capsule 3  . ondansetron (ZOFRAN ODT) 4 MG disintegrating tablet Take 1 tablet (4 mg total) by mouth every 8 (eight) hours as needed for nausea or vomiting. (Patient not taking: Reported on 11/26/2014) 15 tablet 0  . oxyCODONE-acetaminophen  (PERCOCET/ROXICET) 5-325 MG per tablet Take 1 tablet by mouth every 8 (eight) hours as needed for severe pain. (Patient not taking: Reported on 11/26/2014) 15 tablet 0  . pantoprazole (PROTONIX) 20 MG tablet Take 1 tablet (20 mg total) by mouth daily. (Patient not taking: Reported on 11/26/2014) 30 tablet 5   No current facility-administered medications on file prior to visit.    BP 176/92 mmHg  Pulse 96  Temp(Src) 97.5 F (36.4 C) (Oral)  Ht  (1.626 m)  Wt 184 lb 1.9 oz (83.516 kg)  BMI 31.59 kg/m2  SpO2 98%    Objective:   Physical Exam  Constitutional: She appears well-nourished.  Cardiovascular: Normal rate and regular rhythm.   Pulmonary/Chest: Effort normal and breath sounds normal.  Skin: Skin is warm and dry.          Assessment & Plan:

## 2014-12-17 NOTE — Assessment & Plan Note (Signed)
Elevated today, has been off of BP meds for 2 weeks because she's been "out". Called pharmacy today and she has refills available. Explained this to patient and we called pharmacy to refill.

## 2014-12-17 NOTE — Assessment & Plan Note (Signed)
Overall improvement since increase in gabapentin to 300 mg TID.

## 2014-12-31 ENCOUNTER — Telehealth: Payer: Self-pay | Admitting: Primary Care

## 2014-12-31 DIAGNOSIS — E118 Type 2 diabetes mellitus with unspecified complications: Secondary | ICD-10-CM

## 2014-12-31 DIAGNOSIS — N898 Other specified noninflammatory disorders of vagina: Secondary | ICD-10-CM

## 2014-12-31 DIAGNOSIS — Z794 Long term (current) use of insulin: Secondary | ICD-10-CM

## 2014-12-31 MED ORDER — GLUCOSE BLOOD VI STRP: ORAL_STRIP | Status: DC

## 2014-12-31 MED ORDER — ONETOUCH LANCETS MISC: Status: DC

## 2014-12-31 MED ORDER — ONETOUCH ULTRA SYSTEM W/DEVICE KIT: PACK | Status: DC

## 2014-12-31 MED ORDER — FLUCONAZOLE 150 MG PO TABS: 150.0000 mg | ORAL_TABLET | Freq: Once | ORAL | Status: DC

## 2014-12-31 NOTE — Telephone Encounter (Signed)
Message left for patient to return my call.  

## 2014-12-31 NOTE — Telephone Encounter (Signed)
Spoke with Rachel Gonzalez regarding concerns. She has not checked her sugars in several weeks due to her meter being broken. New meter with lancets and test strips sent to pharmacy. Also with vaginal itching and whitish discharge. RX for Diflucan sent into pharmacy. She has not established with new PCP. I highly recommended she do that this week.

## 2014-12-31 NOTE — Telephone Encounter (Signed)
Will you ceck on Rachel Gonzalez for me please? Did she get her Novolin N insulin from the pharmacy? How are her blood sugars? Has she established with a new PCP? Thanks.

## 2014-12-31 NOTE — Telephone Encounter (Signed)
Called patient. Patient stated that she is ok. Yes, she did get the Novolin insulin from the pharmacy. She have not check her sugar since she broke the meter. She have not established care with anyone as of yet. She would like call from [place]. She have some questions and concerns.

## 2015-02-05 ENCOUNTER — Ambulatory Visit: Payer: Medicaid Other | Admitting: Family Medicine

## 2015-02-12 ENCOUNTER — Encounter: Payer: Self-pay | Admitting: Family Medicine

## 2015-02-12 ENCOUNTER — Ambulatory Visit (INDEPENDENT_AMBULATORY_CARE_PROVIDER_SITE_OTHER): Payer: Medicaid Other | Admitting: Family Medicine

## 2015-02-12 ENCOUNTER — Other Ambulatory Visit: Payer: Self-pay

## 2015-02-12 VITALS — BP 147/77 | HR 79 | Temp 97.7°F | Resp 14 | Ht 62.0 in | Wt 172.0 lb

## 2015-02-12 DIAGNOSIS — IMO0002 Reserved for concepts with insufficient information to code with codable children: Secondary | ICD-10-CM

## 2015-02-12 DIAGNOSIS — E118 Type 2 diabetes mellitus with unspecified complications: Secondary | ICD-10-CM | POA: Diagnosis not present

## 2015-02-12 DIAGNOSIS — E1165 Type 2 diabetes mellitus with hyperglycemia: Secondary | ICD-10-CM

## 2015-02-12 DIAGNOSIS — K219 Gastro-esophageal reflux disease without esophagitis: Secondary | ICD-10-CM

## 2015-02-12 DIAGNOSIS — Z23 Encounter for immunization: Secondary | ICD-10-CM | POA: Diagnosis not present

## 2015-02-12 DIAGNOSIS — E1140 Type 2 diabetes mellitus with diabetic neuropathy, unspecified: Secondary | ICD-10-CM

## 2015-02-12 DIAGNOSIS — E669 Obesity, unspecified: Secondary | ICD-10-CM

## 2015-02-12 DIAGNOSIS — Z1239 Encounter for other screening for malignant neoplasm of breast: Secondary | ICD-10-CM

## 2015-02-12 DIAGNOSIS — Z794 Long term (current) use of insulin: Secondary | ICD-10-CM

## 2015-02-12 DIAGNOSIS — I10 Essential (primary) hypertension: Secondary | ICD-10-CM

## 2015-02-12 LAB — CBC WITH DIFFERENTIAL/PLATELET
BASOS ABS: 0.1 10*3/uL (ref 0.0–0.1)
Basophils Relative: 1 % (ref 0–1)
Eosinophils Absolute: 0.1 10*3/uL (ref 0.0–0.7)
Eosinophils Relative: 1 % (ref 0–5)
HEMATOCRIT: 36.3 % (ref 36.0–46.0)
HEMOGLOBIN: 11.4 g/dL — AB (ref 12.0–15.0)
LYMPHS ABS: 2 10*3/uL (ref 0.7–4.0)
LYMPHS PCT: 36 % (ref 12–46)
MCH: 27.2 pg (ref 26.0–34.0)
MCHC: 31.4 g/dL (ref 30.0–36.0)
MCV: 86.6 fL (ref 78.0–100.0)
MONOS PCT: 5 % (ref 3–12)
MPV: 11.9 fL (ref 8.6–12.4)
Monocytes Absolute: 0.3 10*3/uL (ref 0.1–1.0)
NEUTROS ABS: 3.2 10*3/uL (ref 1.7–7.7)
NEUTROS PCT: 57 % (ref 43–77)
PLATELETS: 235 10*3/uL (ref 150–400)
RBC: 4.19 MIL/uL (ref 3.87–5.11)
RDW: 14 % (ref 11.5–15.5)
WBC: 5.6 10*3/uL (ref 4.0–10.5)

## 2015-02-12 LAB — COMPLETE METABOLIC PANEL WITH GFR
ALT: 6 U/L (ref 6–29)
AST: 9 U/L — ABNORMAL LOW (ref 10–35)
Albumin: 3.7 g/dL (ref 3.6–5.1)
Alkaline Phosphatase: 162 U/L — ABNORMAL HIGH (ref 33–130)
BUN: 24 mg/dL (ref 7–25)
CHLORIDE: 95 mmol/L — AB (ref 98–110)
CO2: 27 mmol/L (ref 20–31)
Calcium: 9.2 mg/dL (ref 8.6–10.4)
Creat: 1.3 mg/dL — ABNORMAL HIGH (ref 0.50–0.99)
GFR, EST AFRICAN AMERICAN: 50 mL/min — AB (ref >=60)
GFR, EST NON AFRICAN AMERICAN: 44 mL/min — AB (ref >=60)
GLUCOSE: 468 mg/dL — AB (ref 65–99)
POTASSIUM: 4.4 mmol/L (ref 3.5–5.3)
Sodium: 137 mmol/L (ref 135–146)
Total Bilirubin: 0.4 mg/dL (ref 0.2–1.2)
Total Protein: 6.4 g/dL (ref 6.1–8.1)

## 2015-02-12 LAB — LIPID PANEL
CHOL/HDL RATIO: 2 ratio (ref <=5.0)
Cholesterol: 190 mg/dL (ref 125–200)
HDL: 93 mg/dL (ref >=46)
LDL CALC: 84 mg/dL (ref <130)
TRIGLYCERIDES: 64 mg/dL (ref <150)
VLDL: 13 mg/dL (ref <30)

## 2015-02-12 LAB — POCT URINALYSIS DIP (DEVICE)
Bilirubin Urine: NEGATIVE
GLUCOSE, UA: 500 mg/dL — AB
Ketones, ur: NEGATIVE mg/dL
Leukocytes, UA: NEGATIVE
NITRITE: NEGATIVE
PROTEIN: NEGATIVE mg/dL
SPECIFIC GRAVITY, URINE: 1.01 (ref 1.005–1.030)
UROBILINOGEN UA: 0.2 mg/dL (ref 0.0–1.0)
pH: 5.5 (ref 5.0–8.0)

## 2015-02-12 LAB — GLUCOSE, CAPILLARY
Glucose-Capillary: 435 mg/dL — ABNORMAL HIGH (ref 65–99)
Glucose-Capillary: 457 mg/dL — ABNORMAL HIGH (ref 65–99)
Glucose-Capillary: 420 mg/dL — ABNORMAL HIGH (ref 65–99)

## 2015-02-12 LAB — TSH: TSH: 2.025 u[IU]/mL (ref 0.350–4.500)

## 2015-02-12 MED ORDER — ONETOUCH ULTRA SYSTEM W/DEVICE KIT: PACK | Status: DC

## 2015-02-12 MED ORDER — ONETOUCH LANCETS MISC: Status: DC

## 2015-02-12 MED ORDER — INSULIN REGULAR HUMAN 100 UNIT/ML IJ SOLN
10.0000 [IU] | Freq: Once | INTRAMUSCULAR | Status: AC
  Administered 2015-02-12: 10 [IU] via SUBCUTANEOUS

## 2015-02-12 MED ORDER — OMEPRAZOLE 20 MG PO CPDR: 20.0000 mg | DELAYED_RELEASE_CAPSULE | Freq: Every day | ORAL | Status: DC

## 2015-02-12 MED ORDER — LISINOPRIL 2.5 MG PO TABS: 2.5000 mg | ORAL_TABLET | Freq: Every day | ORAL | Status: DC

## 2015-02-12 MED ORDER — HYDROCHLOROTHIAZIDE 12.5 MG PO TABS: 12.5000 mg | ORAL_TABLET | Freq: Every day | ORAL | Status: DC

## 2015-02-12 MED ORDER — GABAPENTIN 300 MG PO CAPS: 300.0000 mg | ORAL_CAPSULE | Freq: Three times a day (TID) | ORAL | Status: DC

## 2015-02-12 MED ORDER — INSULIN NPH (HUMAN) (ISOPHANE) 100 UNIT/ML ~~LOC~~ SUSP: 10.0000 [IU] | Freq: Two times a day (BID) | SUBCUTANEOUS | Status: DC

## 2015-02-12 MED ORDER — GLUCOSE BLOOD VI STRP: ORAL_STRIP | Status: DC

## 2015-02-12 NOTE — Patient Instructions (Signed)
Basic Carbohydrate Counting for Diabetes Mellitus Carbohydrate counting is a method for keeping track of the amount of carbohydrates you eat. Eating carbohydrates naturally increases the level of sugar (glucose) in your blood, so it is important for you to know the amount that is okay for you to have in every meal. Carbohydrate counting helps keep the level of glucose in your blood within normal limits. The amount of carbohydrates allowed is different for every person. A dietitian can help you calculate the amount that is right for you. Once you know the amount of carbohydrates you can have, you can count the carbohydrates in the foods you want to eat. Carbohydrates are found in the following foods:  Grains, such as breads and cereals.  Dried beans and soy products.  Starchy vegetables, such as potatoes, peas, and corn.  Fruit and fruit juices.  Milk and yogurt.  Sweets and snack foods, such as cake, cookies, candy, chips, soft drinks, and fruit drinks. CARBOHYDRATE COUNTING There are two ways to count the carbohydrates in your food. You can use either of the methods or a combination of both. Reading the "Nutrition Facts" on Packaged Food The "Nutrition Facts" is an area that is included on the labels of almost all packaged food and beverages in the [place]. It includes the serving size of that food or beverage and information about the nutrients in each serving of the food, including the grams (g) of carbohydrate per serving.  Decide the number of servings of this food or beverage that you will be able to eat or drink. Multiply that number of servings by the number of grams of carbohydrate that is listed on the label for that serving. The total will be the amount of carbohydrates you will be having when you eat or drink this food or beverage. Learning Standard Serving Sizes of Food When you eat food that is not packaged or does not include "Nutrition Facts" on the label, you need to  measure the servings in order to count the amount of carbohydrates.A serving of most carbohydrate-rich foods contains about 15 g of carbohydrates. The following list includes serving sizes of carbohydrate-rich foods that provide 15 g ofcarbohydrate per serving:   1 slice of bread (1 oz) or 1 six-inch tortilla.    of a hamburger bun or English muffin.  4-6 crackers.   cup unsweetened dry cereal.    cup hot cereal.   cup rice or pasta.    cup mashed potatoes or  of a large baked potato.  1 cup fresh fruit or one small piece of fruit.    cup canned or frozen fruit or fruit juice.  1 cup milk.   cup plain fat-free yogurt or yogurt sweetened with artificial sweeteners.   cup cooked dried beans or starchy vegetable, such as peas, corn, or potatoes.  Decide the number of standard-size servings that you will eat. Multiply that number of servings by 15 (the grams of carbohydrates in that serving). For example, if you eat 2 cups of strawberries, you will have eaten 2 servings and 30 g of carbohydrates (2 servings x 15 g = 30 g). For foods such as soups and casseroles, in which more than one food is mixed in, you will need to count the carbohydrates in each food that is included. EXAMPLE OF CARBOHYDRATE COUNTING Sample Dinner  3 oz chicken breast.   cup of brown rice.   cup of corn.  1 cup milk.   1 cup strawberries with   sugar-free whipped topping.  Carbohydrate Calculation Step 1: Identify the foods that contain carbohydrates:   Rice.   Corn.   Milk.   Strawberries. Step 2:Calculate the number of servings eaten of each:   2 servings of rice.   1 serving of corn.   1 serving of milk.   1 serving of strawberries. Step 3: Multiply each of those number of servings by 15 g:   2 servings of rice x 15 g = 30 g.   1 serving of corn x 15 g = 15 g.   1 serving of milk x 15 g = 15 g.   1 serving of strawberries x 15 g = 15 g. Step 4: Add  together all of the amounts to find the total grams of carbohydrates eaten: 30 g + 15 g + 15 g + 15 g = 75 g.   This information is not intended to replace advice given to you by your health care provider. Make sure you discuss any questions you have with your health care provider.   Document Released: 02/23/2005 Document Revised: 03/16/2014 Document Reviewed: 01/20/2013 Elsevier Interactive Patient Education 2016 [place]. Diabetes and Foot Care Diabetes may cause you to have problems because of poor blood supply (circulation) to your feet and legs. This may cause the skin on your feet to become thinner, break easier, and heal more slowly. Your skin may become dry, and the skin may peel and crack. You may also have nerve damage in your legs and feet causing decreased feeling in them. You may not notice minor injuries to your feet that could lead to infections or more serious problems. Taking care of your feet is one of the most important things you can do for yourself.  HOME CARE INSTRUCTIONS  Wear shoes at all times, even in the house. Do not go barefoot. Bare feet are easily injured.  Check your feet daily for blisters, cuts, and redness. If you cannot see the bottom of your feet, use a mirror or ask someone for help.  Wash your feet with warm water (do not use hot water) and mild soap. Then pat your feet and the areas between your toes until they are completely dry. Do not soak your feet as this can dry your skin.  Apply a moisturizing lotion or petroleum jelly (that does not contain alcohol and is unscented) to the skin on your feet and to dry, brittle toenails. Do not apply lotion between your toes.  Trim your toenails straight across. Do not dig under them or around the cuticle. File the edges of your nails with an emery board or nail file.  Do not cut corns or calluses or try to remove them with medicine.  Wear clean socks or stockings every day. Make sure they are not too tight.  Do not wear knee-high stockings since they may decrease blood flow to your legs.  Wear shoes that fit properly and have enough cushioning. To break in new shoes, wear them for just a few hours a day. This prevents you from injuring your feet. Always look in your shoes before you put them on to be sure there are no objects inside.  Do not cross your legs. This may decrease the blood flow to your feet.  If you find a minor scrape, cut, or break in the skin on your feet, keep it and the skin around it clean and dry. These areas may be cleansed with mild soap and water. Do not  cleanse the area with peroxide, alcohol, or iodine.  When you remove an adhesive bandage, be sure not to damage the skin around it.  If you have a wound, look at it several times a day to make sure it is healing.  Do not use heating pads or hot water bottles. They may burn your skin. If you have lost feeling in your feet or legs, you may not know it is happening until it is too late.  Make sure your health care provider performs a complete foot exam at least annually or more often if you have foot problems. Report any cuts, sores, or bruises to your health care provider immediately. SEEK MEDICAL CARE IF:   You have an injury that is not healing.  You have cuts or breaks in the skin.  You have an ingrown nail.  You notice redness on your legs or feet.  You feel burning or tingling in your legs or feet.  You have pain or cramps in your legs and feet.  Your legs or feet are numb.  Your feet always feel cold. SEEK IMMEDIATE MEDICAL CARE IF:   There is increasing redness, swelling, or pain in or around a wound.  There is a red line that goes up your leg.  Pus is coming from a wound.  You develop a fever or as directed by your health care provider.  You notice a bad smell coming from an ulcer or wound.   This information is not intended to replace advice given to you by your health care provider. Make sure you  discuss any questions you have with your health care provider.   Document Released: 02/21/2000 Document Revised: 10/26/2012 Document Reviewed: 08/02/2012 Elsevier Interactive Patient Education 2016 [employer] Daily Diabetes Record Check your blood glucose (BG) as directed by your health care provider. Use this form to record your results as well as any diabetes medicines you take, including insulin. Checking your BG, recording it, and bringing your records to your health care provider is very helpful in managing your diabetes. These numbers help your health care provider know if any changes are needed to your diabetes plan.  Week of _____________________________ Date: _________  Seward Tasfia, BG/Medicines: ________________ / __________________________________________________________  LUNCH, BG/Medicines: ____________________ / __________________________________________________________  Laural Golden, BG/Medicines: ___________________ / __________________________________________________________  BEDTIME, BG/Medicines: __________________ / __________________________________________________________ Date: _________  Seward Falan, BG/Medicines: ________________ / __________________________________________________________  LUNCH, BG/Medicines: ____________________ / __________________________________________________________  Laural Golden, BG/Medicines: ___________________ / __________________________________________________________  BEDTIME, BG/Medicines: __________________ / __________________________________________________________ Date: _________  Seward Deliliah, BG/Medicines: ________________ / __________________________________________________________  LUNCH, BG/Medicines: ____________________ / __________________________________________________________  Laural Golden, BG/Medicines: ___________________ / __________________________________________________________  BEDTIME, BG/Medicines: __________________ /  __________________________________________________________ Date: _________  Seward Delrae, BG/Medicines: ________________ / __________________________________________________________  LUNCH, BG/Medicines: ____________________ / __________________________________________________________  Laural Golden, BG/Medicines: ___________________ / __________________________________________________________  BEDTIME, BG/Medicines: __________________ / __________________________________________________________ Date: _________  Seward Ashlen, BG/Medicines: ________________ / __________________________________________________________  LUNCH, BG/Medicines: ____________________ / __________________________________________________________  Laural Golden, BG/Medicines: ___________________ / __________________________________________________________  BEDTIME, BG/Medicines: __________________ / __________________________________________________________ Date: _________  Seward Wakisha, BG/Medicines: ________________ / __________________________________________________________  LUNCH, BG/Medicines: ____________________ / __________________________________________________________  Laural Golden, BG/Medicines: ___________________ / __________________________________________________________  BEDTIME, BG/Medicines: __________________ / __________________________________________________________ Date: _________  Seward Benny, BG/Medicines: ________________ / __________________________________________________________  LUNCH, BG/Medicines: ____________________ / __________________________________________________________  Laural Golden, BG/Medicines: ___________________ / __________________________________________________________  BEDTIME, BG/Medicines: __________________ / __________________________________________________________ Notes: __________________________________________________________________________________________________   This  information is not intended to replace advice given to you by your health care provider. Make sure you discuss any questions you have with your health care provider.   Document Released: 01/28/2004 Document Revised: 03/16/2014 Document  Reviewed: 04/19/2013 Elsevier Interactive Patient Education 2016 [employer] How to Avoid Diabetes Problems You can do a lot to prevent or slow down diabetes problems. Following your diabetes plan and taking care of yourself can reduce your risk of serious or life-threatening complications. Below, you will find certain things you can do to prevent diabetes problems. MANAGE YOUR DIABETES Follow your health care provider's, nurse educator's, and dietitian's instructions for managing your diabetes. They will teach you the basics of diabetes care. They can help answer questions you may have. Learn about diabetes and make healthy choices regarding eating and physical activity. Monitor your blood glucose level regularly. Your health care provider will help you decide how often to check your blood glucose level depending on your treatment goals and how well you are meeting them.  DO NOT USE NICOTINE Nicotine and diabetes are a dangerous combination. Nicotine raises your risk for diabetes problems. If you quit using nicotine, you will lower your risk for heart attack, stroke, nerve disease, and kidney disease. Your cholesterol and your blood pressure levels may improve. Your blood circulation will also improve. Do not use any tobacco products, including cigarettes, chewing tobacco, or electronic cigarettes. If you need help quitting, ask your health care provider. KEEP YOUR BLOOD PRESSURE UNDER CONTROL Your health care provider will determine your individualized target blood pressure based on your age, your medicines, how long you have had diabetes, and any other medical conditions you have. Blood pressure consists of two numbers. Generally, the goal is to keep your top number  (systolic pressure) at or below 130, and your bottom number (diastolic pressure) at or below 80. Your health care provider may recommend a lower target blood pressure reading, if appropriate. Meal planning, medicines, and exercise can help you reach your target blood pressure. Make sure your health care provider checks your blood pressure at every visit. KEEP YOUR CHOLESTEROL UNDER CONTROL Normal cholesterol levels will help prevent heart disease and stroke. These are the biggest health problems for people with diabetes. Keeping cholesterol levels under control can also help with blood flow. Have your cholesterol level checked at least once a year. Your health care provider may prescribe a medicine known as a statin. Statins lower your cholesterol. If you are not taking a statin, ask your health care provider if you should be. Meal planning, exercise, and medicines can help you reach your cholesterol targets.  SCHEDULE AND KEEP YOUR ANNUAL PHYSICAL EXAMS AND EYE EXAMS Your health care provider will tell you how often he or she wants to see you depending on your plan of treatment. It is important that you keep these appointments so that possible problems can be identified early and complications can be avoided or treated.  Every visit with your health care provider should include your weight, blood pressure, and an evaluation of your blood glucose control.  Your hemoglobin A1c should be checked:  At least twice a year if you are at your goal.  Every 3 months if there are changes in treatment.  If you are not meeting your goals.  Your blood lipids should be checked yearly. You should also be checked yearly to see if you have protein in your urine (microalbumin).  Schedule a dilated eye exam within 5 years of your diagnosis if you have type 1 diabetes, and then yearly. Schedule a dilated eye exam at diagnosis if you have type 2 diabetes, and then yearly. All exams thereafter can be extended to every 2  to 3 years  if one or more exams have been normal. KEEP YOUR VACCINES CURRENT It is recommended that you receive a flu (influenza) vaccine every year. It is also recommended that you receive a pneumonia (pneumococcal) vaccine. If you are 38 years of age or older and have never received a pneumonia vaccine, this vaccine may be given as a series of two separate shots. Ask your health care provider which additional vaccines may be recommended. TAKE CARE OF YOUR FEET  Diabetes may cause you to have a poor blood supply (circulation) to your legs and feet. Because of this, the skin may be thinner, break easier, and heal more slowly. You also may have nerve damage in your legs and feet, causing decreased feeling. You may not notice minor injuries to your feet that could lead to serious problems or infections. Taking care of your feet is very important. Visual foot exams are performed at every routine medical visit. The exams check for cuts, injuries, or other problems with the feet. A comprehensive foot exam should be done yearly. This includes visual inspection as well as assessing foot pulses and testing for loss of sensation. You should also do the following:  Inspect your feet daily for cuts, calluses, blisters, ingrown toenails, and signs of infection, such as redness, swelling, or pus.  Wash and dry your feet thoroughly, especially between the toes.  Avoid soaking your feet regularly in hot water baths.  Moisturize dry skin with lotion, avoiding areas between your toes.  Cut toenails straight across and file the edges.  Avoid shoes that do not fit well or have areas that irritate your skin.  Avoid going barefooted or wearing only socks. Your feet need protection. TAKE CARE OF YOUR TEETH People with poorly controlled diabetes are more likely to have gum (periodontal) disease. These infections make diabetes harder to control. Periodontal diseases, if left untreated, can lead to tooth loss. Brush  your teeth twice a day, floss, and see your dentist for checkups and cleaning every 6 months, or 2 times a year. ASK YOUR HEALTH CARE PROVIDER ABOUT TAKING ASPIRIN Taking aspirin daily is recommended to help prevent cardiovascular disease in people with and without diabetes. Ask your health care provider if this would benefit you and what dose he or she would recommend. DRINK RESPONSIBLY Moderate amounts of alcohol (less than 1 drink per day for adult women and less than 2 drinks per day for adult men) have a minimal effect on blood glucose if ingested with food. It is important to eat food with alcohol to avoid hypoglycemia. People should avoid alcohol if they have a history of alcohol abuse or dependence, if they are pregnant, and if they have liver disease, pancreatitis, advanced neuropathy, or severe hypertriglyceridemia. LESSEN STRESS Living with diabetes can be stressful. When you are under stress, your blood glucose may be affected in two ways:  Stress hormones may cause your blood glucose to rise.  You may be distracted from taking good care of yourself. It is a good idea to be aware of your stress level and make changes that are necessary to help you better manage challenging situations. Support groups, planned relaxation, a hobby you enjoy, meditation, healthy relationships, and exercise all work to lower your stress level. If your efforts do not seem to be helping, get help from your health care provider or a trained mental health professional.   This information is not intended to replace advice given to you by your health care provider. Make sure you discuss  any questions you have with your health care provider.   Document Released: 11/11/2010 Document Revised: 03/16/2014 Document Reviewed: 04/19/2013 Elsevier Interactive Patient Education 2016 [employer]. How to Avoid Diabetes Problems You can do a lot to prevent or slow down diabetes problems. Following your diabetes plan and  taking care of yourself can reduce your risk of serious or life-threatening complications. Below, you will find certain things you can do to prevent diabetes problems. MANAGE YOUR DIABETES Follow your health care provider's, nurse educator's, and dietitian's instructions for managing your diabetes. They will teach you the basics of diabetes care. They can help answer questions you may have. Learn about diabetes and make healthy choices regarding eating and physical activity. Monitor your blood glucose level regularly. Your health care provider will help you decide how often to check your blood glucose level depending on your treatment goals and how well you are meeting them.  DO NOT USE NICOTINE Nicotine and diabetes are a dangerous combination. Nicotine raises your risk for diabetes problems. If you quit using nicotine, you will lower your risk for heart attack, stroke, nerve disease, and kidney disease. Your cholesterol and your blood pressure levels may improve. Your blood circulation will also improve. Do not use any tobacco products, including cigarettes, chewing tobacco, or electronic cigarettes. If you need help quitting, ask your health care provider. KEEP YOUR BLOOD PRESSURE UNDER CONTROL Your health care provider will determine your individualized target blood pressure based on your age, your medicines, how long you have had diabetes, and any other medical conditions you have. Blood pressure consists of two numbers. Generally, the goal is to keep your top number (systolic pressure) at or below 130, and your bottom number (diastolic pressure) at or below 80. Your health care provider may recommend a lower target blood pressure reading, if appropriate. Meal planning, medicines, and exercise can help you reach your target blood pressure. Make sure your health care provider checks your blood pressure at every visit. KEEP YOUR CHOLESTEROL UNDER CONTROL Normal cholesterol levels will help prevent heart  disease and stroke. These are the biggest health problems for people with diabetes. Keeping cholesterol levels under control can also help with blood flow. Have your cholesterol level checked at least once a year. Your health care provider may prescribe a medicine known as a statin. Statins lower your cholesterol. If you are not taking a statin, ask your health care provider if you should be. Meal planning, exercise, and medicines can help you reach your cholesterol targets.  SCHEDULE AND KEEP YOUR ANNUAL PHYSICAL EXAMS AND EYE EXAMS Your health care provider will tell you how often he or she wants to see you depending on your plan of treatment. It is important that you keep these appointments so that possible problems can be identified early and complications can be avoided or treated.  Every visit with your health care provider should include your weight, blood pressure, and an evaluation of your blood glucose control.  Your hemoglobin A1c should be checked:  At least twice a year if you are at your goal.  Every 3 months if there are changes in treatment.  If you are not meeting your goals.  Your blood lipids should be checked yearly. You should also be checked yearly to see if you have protein in your urine (microalbumin).  Schedule a dilated eye exam within 5 years of your diagnosis if you have type 1 diabetes, and then yearly. Schedule a dilated eye exam at diagnosis if you have  type 2 diabetes, and then yearly. All exams thereafter can be extended to every 2 to 3 years if one or more exams have been normal. KEEP YOUR VACCINES CURRENT It is recommended that you receive a flu (influenza) vaccine every year. It is also recommended that you receive a pneumonia (pneumococcal) vaccine. If you are 465 years of age or older and have never received a pneumonia vaccine, this vaccine may be given as a series of two separate shots. Ask your health care provider which additional vaccines may be  recommended. TAKE CARE OF YOUR FEET  Diabetes may cause you to have a poor blood supply (circulation) to your legs and feet. Because of this, the skin may be thinner, break easier, and heal more slowly. You also may have nerve damage in your legs and feet, causing decreased feeling. You may not notice minor injuries to your feet that could lead to serious problems or infections. Taking care of your feet is very important. Visual foot exams are performed at every routine medical visit. The exams check for cuts, injuries, or other problems with the feet. A comprehensive foot exam should be done yearly. This includes visual inspection as well as assessing foot pulses and testing for loss of sensation. You should also do the following:  Inspect your feet daily for cuts, calluses, blisters, ingrown toenails, and signs of infection, such as redness, swelling, or pus.  Wash and dry your feet thoroughly, especially between the toes.  Avoid soaking your feet regularly in hot water baths.  Moisturize dry skin with lotion, avoiding areas between your toes.  Cut toenails straight across and file the edges.  Avoid shoes that do not fit well or have areas that irritate your skin.  Avoid going barefooted or wearing only socks. Your feet need protection. TAKE CARE OF YOUR TEETH People with poorly controlled diabetes are more likely to have gum (periodontal) disease. These infections make diabetes harder to control. Periodontal diseases, if left untreated, can lead to tooth loss. Brush your teeth twice a day, floss, and see your dentist for checkups and cleaning every 6 months, or 2 times a year. ASK YOUR HEALTH CARE PROVIDER ABOUT TAKING ASPIRIN Taking aspirin daily is recommended to help prevent cardiovascular disease in people with and without diabetes. Ask your health care provider if this would benefit you and what dose he or she would recommend. DRINK RESPONSIBLY Moderate amounts of alcohol (less than 1  drink per day for adult women and less than 2 drinks per day for adult men) have a minimal effect on blood glucose if ingested with food. It is important to eat food with alcohol to avoid hypoglycemia. People should avoid alcohol if they have a history of alcohol abuse or dependence, if they are pregnant, and if they have liver disease, pancreatitis, advanced neuropathy, or severe hypertriglyceridemia. LESSEN STRESS Living with diabetes can be stressful. When you are under stress, your blood glucose may be affected in two ways:  Stress hormones may cause your blood glucose to rise.  You may be distracted from taking good care of yourself. It is a good idea to be aware of your stress level and make changes that are necessary to help you better manage challenging situations. Support groups, planned relaxation, a hobby you enjoy, meditation, healthy relationships, and exercise all work to lower your stress level. If your efforts do not seem to be helping, get help from your health care provider or a trained mental health professional.   This  information is not intended to replace advice given to you by your health care provider. Make sure you discuss any questions you have with your health care provider.   Document Released: 11/11/2010 Document Revised: 03/16/2014 Document Reviewed: 04/19/2013 Elsevier Interactive Patient Education [employer]

## 2015-02-12 NOTE — Progress Notes (Signed)
Subjective:    Patient ID: Rachel Gonzalez, female    DOB: 15-May-1950, 64 y.o.   MRN: [id number]  HPI  [name], a 64 year old female with a history of hypertension and diabetes mellitus type 2 presents to establish care. Patient states that she was a patient of Vernona Rieger, FNP at [facility]. She states that he has been unable to continue care due to insurance constraints.   Patient presents for a follow up of hypertension. She is not exercising and is not adherent to low salt diet.  She does not check blood pressure at home.   Patient denies chest pain, chest pressure/discomfort, fatigue, irregular heart beat, near-syncope, palpitations and tachypnea.  Cardiovascular risk factors include: diabetes mellitus and sedentary lifestyle.    Patient also has a history of uncontrolled type 2 diabetes mellitus. She states that she has not been taking medication consistently. She ran out of insulin 1 week ago. She is also not checking blood sugars at home. She does not have diabetic supplies. 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 She maintains that wound has healed. She continues to walk with a cane due to gait abnormality. She maintains that she was cleared with orthopedic physician several weeks ago.   Rachel Gonzalez has a history of GERD. Her primary symptoms include heartburn and belching. She was previously taking Protonix 20 mg daily with minimal relief.  She denies difficulty swallowing, dysphagia, early satiety, hoarseness, nausea, need to clear throat frequently, regurgitation of undigested food and unexpected weight loss. Symptoms have been present for several months. She denies dysphagia.  She has not lost weight. She denies melena, hematochezia, hematemesis, and coffee ground emesis.   Past Medical History  Diagnosis Date  . Diabetes mellitus   . Hypertension   . Anemia   . Renal disorder    Immunization History  Administered Date(s)  Administered  . Pneumococcal Polysaccharide-23 02/12/2015  . Td 05/07/2014  . Tdap 11/21/2007   Allergies  Allergen Reactions  . Tramadol Nausea And Vomiting   Social History   Social History  . Marital Status: Married    Spouse Name: N/A  . Number of Children: N/A  . Years of Education: N/A   Occupational History  . Not on file.   Social History Main Topics  . Smoking status: Never Smoker   . Smokeless tobacco: Not on file  . Alcohol Use: No  . Drug Use: No  . Sexual Activity: Not on file   Other Topics Concern  . Not on file   Social History Narrative   From [place].   Married.   Three children, 9 grandchildren.   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   Travels to [place], [address]      Review of Systems  Constitutional: Positive for fatigue and unexpected weight change. Negative for fever.  HENT: Negative.   Respiratory: Negative.   Cardiovascular: Negative.  Negative for chest pain, palpitations and leg swelling.  Gastrointestinal: Negative.        History of heartburn  Endocrine: Positive for polydipsia, polyphagia and polyuria.  Genitourinary: Negative.  Negative for hematuria.  Musculoskeletal: Positive for myalgias (right foot pain/ ambulates with cane).       S/P right great toe amputation  Skin: Negative.   Neurological: Negative.  Negative for dizziness, facial asymmetry, weakness, light-headedness and numbness.  Hematological: Negative.   Psychiatric/Behavioral: Negative.  Patient reports depression       Objective:   Physical Exam  Constitutional: She appears well-developed and well-nourished. She is active. She does not have a sickly appearance.  HENT:  Head: Normocephalic and atraumatic.  Eyes: Lids are normal.  Neck: Normal range of motion and full passive range of motion without pain. Neck supple.  Cardiovascular: Normal rate, regular rhythm, S1 normal, normal heart sounds and  normal pulses.   Pulmonary/Chest: Effort normal and breath sounds normal.  Abdominal: Soft. Normal appearance and bowel sounds are normal. There is no tenderness.  Musculoskeletal:       Right ankle: She exhibits decreased range of motion. She exhibits no swelling and normal pulse.  Right great toe amputation  Lymphadenopathy:       Head (right side): No submental and no submandibular adenopathy present.       Head (left side): No submental and no submandibular adenopathy present.  Neurological: She is alert.  Skin: Skin is warm, dry and intact.  Hyperpigmented scar to left anterior neck  Psychiatric: Her speech is normal and behavior is normal. Judgment and thought content normal. Cognition and memory are normal. She exhibits a depressed mood.      BP 147/77 mmHg  Pulse 79  Temp(Src) 97.7 F (36.5 C) (Oral)  Resp 14  Ht 5\' 2"  (1.575 m)  Wt 172 lb (78.019 kg)  BMI 31.45 kg/m2 Assessment & Plan:    1. Uncontrolled type 2 diabetes mellitus with complication, unspecified long term insulin use status (HCC) Patient's fasting CBG 447, ave bolus doses of insulin in office. Patient is asymptomatic, alert, oriented, and ambulatory. Previous hemoglobin a1C is 14%. Will send a referral to diabetes management.  - Hemoglobin A1c - COMPLETE METABOLIC PANEL WITH GFR - TSH - Lipid Panel - Glucose (CBG) - Glucose, capillary - insulin regular (NOVOLIN R,HUMULIN R) 100 units/mL injection 10 Units; Inject 0.1 mLs (10 Units total) into the skin once. - POCT urinalysis dip (device) - Glucose, capillary - Glucose, capillary  2. Type 2 diabetes mellitus with diabetic neuropathy, with long-term current use of insulin (HCC) Will continue gabapentin 300 mg TID at current dosage.   3. Essential hypertension Blood pressure is above goal on current medication regimen. Will increase Lisinopril to 5 mg daily.  - CBC with Differential  4. Obesity (BMI 30-39.9) Recommend a lowfat, low carbohydrate diet  divided over 5-6 small meals, increase water intake to 6-8 glasses, and 150 minutes per week of cardiovascular exercise.    5. Breast cancer screening - MM Digital Screening; Future  6. Type 2 diabetes mellitus with complication, with long-term current use of insulin (HCC) - ONE TOUCH LANCETS MISC; Test 3 times daily with glucometer. Dx Type 2 diabetes with unspecified complications.  Dispense: 200 each; Refill: 5 - glucose blood test strip; Test three times daily. One Touch Ultra test strips. Dx. Type 2 Diabetes.  Dispense: 100 each; Refill: 12  8. Gastroesophageal reflux disease without esophagitis Will start a trial of omeprazole. Will follow up in 6 weeks. Given information pertaining to diet.   9. Immunization due - Pneumococcal polysaccharide vaccine 23-valent greater than or equal to 2yo subcutaneous/IM - POCT urinalysis dipstick   RTC: 1 month Jahsir Rama M, FNP

## 2015-02-13 ENCOUNTER — Telehealth: Payer: Self-pay | Admitting: Family Medicine

## 2015-02-13 DIAGNOSIS — E1165 Type 2 diabetes mellitus with hyperglycemia: Secondary | ICD-10-CM

## 2015-02-13 DIAGNOSIS — E118 Type 2 diabetes mellitus with unspecified complications: Principal | ICD-10-CM

## 2015-02-13 LAB — HEMOGLOBIN A1C
HEMOGLOBIN A1C: 14.9 % — AB (ref <5.7)
MEAN PLASMA GLUCOSE: 381 mg/dL — AB (ref <117)

## 2015-02-13 MED ORDER — INSULIN ASPART 100 UNIT/ML CARTRIDGE (PENFILL): 6.0000 [IU] | Freq: Three times a day (TID) | SUBCUTANEOUS | Status: DC

## 2015-02-13 MED ORDER — INSULIN GLARGINE 100 UNIT/ML SOLOSTAR PEN: 20.0000 [IU] | PEN_INJECTOR | Freq: Every day | SUBCUTANEOUS | Status: DC

## 2015-02-13 NOTE — Telephone Encounter (Signed)
   Reviewed laboratory values. Hemoglobin a1C is markedly elevated. Patient has been out of insulin and was not taking medication consistently prior to running out. I will discontinue Humalin N (NPH) and add a bolus insulin Novalog with meal coverage. I will also add basal insulin, Lantus 20 units at bed time.  [name] is to check blood sugars as follows:  Fasting blood sugar in AM.  Take 6 units of Novalog with breakfast. Refrain from taking Novalog if CBG is < 100.  Check 2-3 hours post prandially, which is prior to lunch. Take 6 units of Novalog with lunch.  Check blood sugar 2-3 hours post prandially. Take 6 units of insulin with dinner. Refrain from taking Novalog if CBG is <100.    Check blood sugar at bed time. Take Lantus 20 units at bedtime.   Patient to have a low carbohydrate, high protein snack at bedtime (peanut butter, greek yogurt, string cheese, etc).    Patient is to stop by office for information package on hyper/hypoglycemia, meal planning, and daily diabetes log.   Patient will need to follow up in office in 1 week. She will need extensive education and medication management. I will also send a referral to diabetes and nutrition education. Patient has had diabetes for 27 years and does not understand basic carbohydrate exchange.    Meds ordered this encounter  Medications  . Insulin Glargine (LANTUS SOLOSTAR) 100 UNIT/ML Solostar Pen    Sig: Inject 20 Units into the skin daily at 10 pm.    Dispense:  15 mL    Refill:  3  . insulin aspart (NOVOLOG) cartridge    Sig: Inject 6 Units into the skin 3 (three) times daily with meals.    Dispense:  15 mL    Refill:  3    Cicley Ganesh M, FNP

## 2015-02-15 NOTE — Telephone Encounter (Signed)
Left message asking patient to call back regarding changes in medication. Thanks!

## 2015-02-18 NOTE — Telephone Encounter (Signed)
Called and left message asking patient to return call regarding medication changes and left call back number. Thanks!

## 2015-02-19 NOTE — Telephone Encounter (Signed)
Have not been able to reach patient by phone for several days. Will mail letter, asking patient to contact our office asap regarding medication changes. Thanks!

## 2015-03-13 ENCOUNTER — Other Ambulatory Visit: Payer: Self-pay | Admitting: Family Medicine

## 2015-03-18 ENCOUNTER — Ambulatory Visit: Payer: Medicaid Other | Admitting: Family Medicine

## 2015-03-19 ENCOUNTER — Ambulatory Visit: Payer: Medicaid Other | Admitting: Family Medicine

## 2015-03-26 ENCOUNTER — Ambulatory Visit: Payer: Medicaid Other | Admitting: *Deleted

## 2015-04-10 ENCOUNTER — Other Ambulatory Visit: Payer: Self-pay | Admitting: Family Medicine

## 2015-04-10 DIAGNOSIS — Z1231 Encounter for screening mammogram for malignant neoplasm of breast: Secondary | ICD-10-CM

## 2015-04-14 ENCOUNTER — Other Ambulatory Visit: Payer: Self-pay | Admitting: Family Medicine

## 2015-04-15 ENCOUNTER — Other Ambulatory Visit: Payer: Self-pay

## 2015-04-15 DIAGNOSIS — K219 Gastro-esophageal reflux disease without esophagitis: Secondary | ICD-10-CM

## 2015-04-15 MED ORDER — OMEPRAZOLE 20 MG PO CPDR: 20.0000 mg | DELAYED_RELEASE_CAPSULE | Freq: Every day | ORAL | Status: DC

## 2015-04-15 NOTE — Telephone Encounter (Signed)
Refill for omeprazole  has been completed and sent into pharmacy. Thanks!

## 2015-04-23 ENCOUNTER — Ambulatory Visit
Admission: RE | Admit: 2015-04-23 | Discharge: 2015-04-23 | Disposition: A | Discharge status: Home / Self Care | Payer: Medicaid Other | Source: Ambulatory Visit | Attending: Family Medicine | Admitting: Family Medicine

## 2015-04-23 DIAGNOSIS — Z1231 Encounter for screening mammogram for malignant neoplasm of breast: Secondary | ICD-10-CM

## 2015-05-09 ENCOUNTER — Ambulatory Visit: Payer: Medicaid Other | Admitting: Family Medicine

## 2015-06-10 ENCOUNTER — Ambulatory Visit (HOSPITAL_COMMUNITY)
Admission: RE | Admit: 2015-06-10 | Discharge: 2015-06-10 | Disposition: A | Discharge status: Home / Self Care | Payer: Medicaid Other | Source: Ambulatory Visit | Attending: Internal Medicine | Admitting: Internal Medicine

## 2015-06-10 ENCOUNTER — Other Ambulatory Visit: Payer: Self-pay | Admitting: Family Medicine

## 2015-06-10 ENCOUNTER — Ambulatory Visit (INDEPENDENT_AMBULATORY_CARE_PROVIDER_SITE_OTHER): Payer: Medicaid Other | Admitting: Family Medicine

## 2015-06-10 ENCOUNTER — Encounter: Payer: Self-pay | Admitting: Family Medicine

## 2015-06-10 VITALS — BP 162/78 | HR 78 | Temp 97.6°F | Resp 14 | Ht 62.0 in | Wt 181.0 lb

## 2015-06-10 DIAGNOSIS — D6489 Other specified anemias: Secondary | ICD-10-CM | POA: Diagnosis not present

## 2015-06-10 DIAGNOSIS — R81 Glycosuria: Secondary | ICD-10-CM

## 2015-06-10 DIAGNOSIS — I10 Essential (primary) hypertension: Secondary | ICD-10-CM

## 2015-06-10 DIAGNOSIS — E118 Type 2 diabetes mellitus with unspecified complications: Secondary | ICD-10-CM | POA: Diagnosis not present

## 2015-06-10 DIAGNOSIS — E669 Obesity, unspecified: Secondary | ICD-10-CM | POA: Diagnosis not present

## 2015-06-10 DIAGNOSIS — E1165 Type 2 diabetes mellitus with hyperglycemia: Secondary | ICD-10-CM

## 2015-06-10 DIAGNOSIS — J309 Allergic rhinitis, unspecified: Secondary | ICD-10-CM

## 2015-06-10 DIAGNOSIS — R739 Hyperglycemia, unspecified: Secondary | ICD-10-CM | POA: Insufficient documentation

## 2015-06-10 DIAGNOSIS — Z794 Long term (current) use of insulin: Secondary | ICD-10-CM

## 2015-06-10 LAB — GLUCOSE, CAPILLARY
GLUCOSE-CAPILLARY: 499 mg/dL — AB (ref 65–99)
GLUCOSE-CAPILLARY: 499 mg/dL — AB (ref 65–99)
Glucose-Capillary: 350 mg/dL — ABNORMAL HIGH (ref 65–99)
Glucose-Capillary: 408 mg/dL — ABNORMAL HIGH (ref 65–99)

## 2015-06-10 LAB — CBC WITH DIFFERENTIAL/PLATELET
BASOS ABS: 53 {cells}/uL (ref 0–200)
BASOS PCT: 1 %
EOS PCT: 1 %
Eosinophils Absolute: 53 cells/uL (ref 15–500)
HCT: 34.9 % — ABNORMAL LOW (ref 35.0–45.0)
Hemoglobin: 10.5 g/dL — ABNORMAL LOW (ref 11.7–15.5)
LYMPHS ABS: 1802 {cells}/uL (ref 850–3900)
Lymphocytes Relative: 34 %
MCH: 26.2 pg — AB (ref 27.0–33.0)
MCHC: 30.1 g/dL — ABNORMAL LOW (ref 32.0–36.0)
MCV: 87 fL (ref 80.0–100.0)
MONOS PCT: 6 %
MPV: 11.7 fL (ref 7.5–12.5)
Monocytes Absolute: 318 cells/uL (ref 200–950)
NEUTROS ABS: 3074 {cells}/uL (ref 1500–7800)
Neutrophils Relative %: 58 %
PLATELETS: 249 10*3/uL (ref 140–400)
RBC: 4.01 MIL/uL (ref 3.80–5.10)
RDW: 13.1 % (ref 11.0–15.0)
WBC: 5.3 10*3/uL (ref 3.8–10.8)

## 2015-06-10 LAB — HEMOGLOBIN A1C: Hgb A1c MFr Bld: >18 % — ABNORMAL HIGH (ref <5.7)

## 2015-06-10 LAB — POCT URINALYSIS DIP (DEVICE)
Bilirubin Urine: NEGATIVE
GLUCOSE, UA: 500 mg/dL — AB
KETONES UR: NEGATIVE mg/dL
NITRITE: NEGATIVE
PH: 5.5 (ref 5.0–8.0)
PROTEIN: NEGATIVE mg/dL
Specific Gravity, Urine: <=1.005 (ref 1.005–1.030)
Urobilinogen, UA: 0.2 mg/dL (ref 0.0–1.0)

## 2015-06-10 MED ORDER — CETIRIZINE HCL 10 MG PO TABS: 10.0000 mg | ORAL_TABLET | Freq: Every day | ORAL | Status: DC

## 2015-06-10 MED ORDER — LISINOPRIL 2.5 MG PO TABS: 2.5000 mg | ORAL_TABLET | Freq: Every day | ORAL | Status: DC

## 2015-06-10 MED ORDER — INSULIN GLARGINE 100 UNIT/ML SOLOSTAR PEN: 20.0000 [IU] | PEN_INJECTOR | Freq: Every day | SUBCUTANEOUS | Status: DC

## 2015-06-10 MED ORDER — HYDROCHLOROTHIAZIDE 12.5 MG PO TABS: 12.5000 mg | ORAL_TABLET | Freq: Every day | ORAL | Status: DC

## 2015-06-10 MED ORDER — ONETOUCH LANCETS MISC: Status: DC

## 2015-06-10 MED ORDER — ONETOUCH ULTRA SYSTEM W/DEVICE KIT: PACK | Status: DC

## 2015-06-10 MED ORDER — SODIUM CHLORIDE 0.9 % IV SOLN
INTRAVENOUS | Status: DC
  Administered 2015-06-10: 16:00:00 via INTRAVENOUS

## 2015-06-10 MED ORDER — INSULIN REGULAR HUMAN 100 UNIT/ML IJ SOLN
20.0000 [IU] | Freq: Once | INTRAMUSCULAR | Status: AC
Start: 2015-06-10 — End: 2015-06-10
  Administered 2015-06-10: 20 [IU] via SUBCUTANEOUS

## 2015-06-10 MED ORDER — GLUCOSE BLOOD VI STRP: ORAL_STRIP | Status: DC

## 2015-06-10 MED ORDER — INSULIN ASPART 100 UNIT/ML CARTRIDGE (PENFILL): 6.0000 [IU] | Freq: Three times a day (TID) | SUBCUTANEOUS | Status: DC

## 2015-06-10 NOTE — Progress Notes (Addendum)
Diagnosis Association: Hyperglycemia (790.29)          Provider: C. Hollis, NP  Procedure: Pt received 1000 ml of normal saline and blood sugar checked.  Pt tolerated procedure well.  Post procedure: Pt alert, oriented and ambulatory at discharge. Provider notified of blood sugar results.

## 2015-06-10 NOTE — Discharge Instructions (Signed)
Pt received 1 liter of IV fluids and blood sugars checked.

## 2015-06-10 NOTE — Progress Notes (Signed)
Subjective:    Patient ID: Rachel Gonzalez, female    DOB: [birth year], 65 y.o.   MRN: [id number]  HPI  [name], a 65 year old female with a history of hypertension and diabetes mellitus type 2 presents for a follow up of diabetes mellitus. Patient presents for a follow up of hypertension. She is not exercising and is not adherent to low salt diet.  She does not check blood pressure at home.   Patient denies chest pain, chest pressure/discomfort, fatigue, irregular heart beat, near-syncope, palpitations and tachypnea.  Cardiovascular risk factors include: diabetes mellitus and sedentary lifestyle.    Patient also has a history of uncontrolled type 2 diabetes mellitus. She states that she has not been taking medication consistently. She has only been taking humalin and has not picked up Novolog or Lantus. She is also not checking blood sugars at home. She does not have diabetic supplies. I prescribed diabetic supplies, but patient did not pick up from [place]. 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 She maintains that wound has healed. She continues to walk with a cane due to gait abnormality. She maintains that she was cleared with orthopedic physician several weeks ago.   Past Medical History  Diagnosis Date  . Diabetes mellitus   . Hypertension   . Anemia   . Renal disorder    Immunization History  Administered Date(s) Administered  . Pneumococcal Polysaccharide-23 02/12/2015  . Td 05/07/2014  . Tdap 11/21/2007   Allergies  Allergen Reactions  . Tramadol Nausea And Vomiting   Social History   Social History  . Marital Status: Married    Spouse Name: N/A  . Number of Children: N/A  . Years of Education: N/A   Occupational History  . Not on file.   Social History Main Topics  . Smoking status: Never Smoker   . Smokeless tobacco: Not on file  . Alcohol Use: No  . Drug Use: No  . Sexual Activity: Not on file   Other Topics Concern  . Not  on file   Social History Narrative   From [place].   Married.   Three children, 9 grandchildren.   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   Travels to [place], [place]      Review of Systems  Constitutional: Positive for fatigue and unexpected weight change. Negative for fever.  HENT: Negative.   Respiratory: Negative.   Cardiovascular: Negative.  Negative for chest pain, palpitations and leg swelling.  Gastrointestinal: Negative.   Endocrine: Positive for polydipsia, polyphagia and polyuria.  Genitourinary: Negative.  Negative for hematuria.  Musculoskeletal: Positive for myalgias (right foot pain/ ambulates with cane).       S/P right great toe amputation  Skin: Negative.   Allergic/Immunologic: Positive for environmental allergies.  Neurological: Negative.  Negative for dizziness, facial asymmetry, weakness, light-headedness and numbness.  Hematological: Negative.   Psychiatric/Behavioral: Negative.        Objective:   Physical Exam  Constitutional: She appears well-developed and well-nourished. She is active. She does not have a sickly appearance.  HENT:  Head: Normocephalic and atraumatic.  Nose: Mucosal edema present.  Eyes: Lids are normal.  Neck: Normal range of motion and full passive range of motion without pain. Neck supple.  Cardiovascular: Normal rate, regular rhythm, S1 normal, normal heart sounds and normal pulses.   Pulmonary/Chest: Effort normal and breath sounds normal.  Abdominal: Soft. Normal appearance  and bowel sounds are normal. There is no tenderness.  Musculoskeletal:       Right ankle: She exhibits decreased range of motion. She exhibits no swelling and normal pulse.  Right great toe amputation  Lymphadenopathy:       Head (right side): No submental and no submandibular adenopathy present.       Head (left side): No submental and no submandibular adenopathy present.  Neurological: She is alert.   Skin: Skin is warm, dry and intact.  Hyperpigmented scar to left anterior neck  Psychiatric: Her speech is normal and behavior is normal. Judgment and thought content normal. Cognition and memory are normal. She exhibits a depressed mood.      BP 162/78 mmHg  Pulse 78  Temp(Src) 97.6 F (36.4 C) (Oral)  Resp 14  Ht '5\' 2"'$  (1.575 m)  Wt 181 lb (82.101 kg)  BMI 33.10 kg/m2  SpO2 100% Assessment & Plan:   1. Uncontrolled type 2 diabetes mellitus with complication, unspecified long term insulin use status ([place]) There have been some probable compliance issues here. I have discussed with her the great importance of following the treatment plan exactly as directed in order to achieve a good medical outcome. Printed off a copy of medications and discussed at length. I highlighted medications and wrote medication times to assist with adherence. Patient will follow up in 1 week.  - Hemoglobin A1C - COMPLETE METABOLIC PANEL WITH GFR - Insulin Glargine (LANTUS SOLOSTAR) 100 UNIT/ML Solostar Pen; Inject 20 Units into the skin daily at 10 pm.  Dispense: 15 mL; Refill: 3 - insulin aspart (NOVOLOG) cartridge; Inject 6 Units into the skin 3 (three) times daily with meals.  Dispense: 15 mL; Refill: 3  2. Hyperglycemia Patient's CBG is 499. Patient received 20 units of insulin. She transitioned to the day infusion center for a Bolus of normal saline for hyperglycemia.  - insulin regular (NOVOLIN R,HUMULIN R) 100 units/mL injection 20 Units; Inject 0.2 mLs (20 Units total) into the skin once.  3. Essential hypertension Blood pressure is above goal. Stressed the importance of taking medications consistently.  - Glucose (CBG) - POCT urinalysis dipstick - COMPLETE METABOLIC PANEL WITH GFR - hydrochlorothiazide (HYDRODIURIL) 12.5 MG tablet; Take 1 tablet (12.5 mg total) by mouth daily.  Dispense: 90 tablet; Refill: 0  4. Anemia due to other cause  - CBC with Differential  5. Type 2 diabetes mellitus  with complication, with long-term current use of insulin (HCC)  - lisinopril (PRINIVIL,ZESTRIL) 2.5 MG tablet; Take 1 tablet (2.5 mg total) by mouth daily.  Dispense: 90 tablet; Refill: 0 - ONE TOUCH LANCETS MISC; Test 3 times daily with glucometer. Dx Type 2 diabetes with unspecified complications.  Dispense: 200 each; Refill: 5 - Blood Glucose Monitoring Suppl (ONE TOUCH ULTRA SYSTEM KIT) w/Device KIT; Test three times daily. Dx Type 2 Diabetes.  Dispense: 1 each; Refill: 0 - glucose blood test strip; Test three times daily. One Touch Ultra test strips. Dx. Type 2 Diabetes.  Dispense: 100 each; Refill: 12  6. Glucosuria - Microalbumin/Creatinine Ratio, Urine  7. Allergic rhinitis, unspecified allergic rhinitis type  - cetirizine (ZYRTEC) 10 MG tablet; Take 1 tablet (10 mg total) by mouth daily.  Dispense: 30 tablet; Refill: 11    RTC: 1 week for diabetes mellitus type 2 and hypertension   Gail Vendetti M, FNP    The patient was given clear instructions to go to ER or return to medical center if symptoms do not improve, worsen or  new problems develop. The patient verbalized understanding. Will notify patient with laboratory results.

## 2015-06-11 ENCOUNTER — Other Ambulatory Visit: Payer: Self-pay | Admitting: Family Medicine

## 2015-06-11 ENCOUNTER — Telehealth: Payer: Self-pay | Admitting: Family Medicine

## 2015-06-11 ENCOUNTER — Other Ambulatory Visit: Payer: Self-pay

## 2015-06-11 DIAGNOSIS — Z794 Long term (current) use of insulin: Principal | ICD-10-CM

## 2015-06-11 DIAGNOSIS — IMO0002 Reserved for concepts with insufficient information to code with codable children: Secondary | ICD-10-CM

## 2015-06-11 DIAGNOSIS — R739 Hyperglycemia, unspecified: Secondary | ICD-10-CM | POA: Diagnosis not present

## 2015-06-11 DIAGNOSIS — E118 Type 2 diabetes mellitus with unspecified complications: Principal | ICD-10-CM

## 2015-06-11 DIAGNOSIS — E1165 Type 2 diabetes mellitus with hyperglycemia: Secondary | ICD-10-CM

## 2015-06-11 LAB — COMPLETE METABOLIC PANEL WITH GFR
ALBUMIN: 3.5 g/dL — AB (ref 3.6–5.1)
ALK PHOS: 197 U/L — AB (ref 33–130)
ALT: 8 U/L (ref 6–29)
AST: 10 U/L (ref 10–35)
BILIRUBIN TOTAL: 0.3 mg/dL (ref 0.2–1.2)
BUN: 27 mg/dL — ABNORMAL HIGH (ref 7–25)
CO2: 25 mmol/L (ref 20–31)
Calcium: 8.9 mg/dL (ref 8.6–10.4)
Chloride: 91 mmol/L — ABNORMAL LOW (ref 98–110)
Creat: 1.42 mg/dL — ABNORMAL HIGH (ref 0.50–0.99)
GFR, EST AFRICAN AMERICAN: 45 mL/min — AB (ref >=60)
GFR, EST NON AFRICAN AMERICAN: 39 mL/min — AB (ref >=60)
GLUCOSE: 562 mg/dL — AB (ref 65–99)
POTASSIUM: 4.6 mmol/L (ref 3.5–5.3)
SODIUM: 127 mmol/L — AB (ref 135–146)
Total Protein: 6 g/dL — ABNORMAL LOW (ref 6.1–8.1)

## 2015-06-11 LAB — MICROALBUMIN / CREATININE URINE RATIO
Creatinine, Urine: 53 mg/dL (ref 20–320)
MICROALB/CREAT RATIO: 30 ug/mg{creat} — AB (ref <30)
Microalb, Ur: 1.6 mg/dL

## 2015-06-11 MED ORDER — ACCU-CHEK SOFTCLIX LANCET DEV MISC: Status: DC

## 2015-06-11 MED ORDER — GLUCOSE BLOOD VI STRP: ORAL_STRIP | Status: DC

## 2015-06-11 MED ORDER — ACCU-CHEK AVIVA PLUS W/DEVICE KIT: 1.0000 | PACK | Freq: Once | Status: DC

## 2015-06-11 MED ORDER — INSULIN PEN NEEDLE 29G X 12.7MM MISC: 1.0000 | Freq: Three times a day (TID) | Status: DC

## 2015-06-11 NOTE — Telephone Encounter (Signed)
Correct meter and strips sent into pharmacy for insurance. Thanks!

## 2015-06-11 NOTE — Telephone Encounter (Signed)
Ms. Rachel SellerCarol Gonzalez, a 65 year old female with a history of uncontrolled diabetes mellitus type 2. [name] presented on 06/10/2015 with a blood sugar of 499, she was given 20 units of insulin and a 1000 liter bolus of normal saline. CBG decreased to 350 prior to discharge. Patient's hemoglobin a1C was >18. She is not checking blood sugars at home and has not been taking insulin regularly. She missed her last scheduled appointments. [name] and I discussed finding at length. She states that she picked up all medications and plans on starting today. She will follow up in office in 1 week and bring glucometer to f/u appointment.   Massie MaroonHollis,Rachel Gonzalez M, FNP

## 2015-06-12 ENCOUNTER — Telehealth: Payer: Self-pay

## 2015-06-12 NOTE — Telephone Encounter (Signed)
Received a fax from [facility] for Medical records request. This has  [facility]. Thanks!

## 2015-06-18 ENCOUNTER — Ambulatory Visit: Payer: Medicaid Other | Admitting: Family Medicine

## 2015-07-16 ENCOUNTER — Ambulatory Visit: Payer: Medicaid Other | Admitting: Family Medicine

## 2015-07-16 ENCOUNTER — Other Ambulatory Visit: Payer: Self-pay | Admitting: Primary Care

## 2015-07-16 ENCOUNTER — Encounter: Payer: Self-pay | Admitting: Family Medicine

## 2015-07-16 ENCOUNTER — Ambulatory Visit (INDEPENDENT_AMBULATORY_CARE_PROVIDER_SITE_OTHER): Payer: Medicaid Other | Admitting: Family Medicine

## 2015-07-16 VITALS — BP 196/98 | HR 84 | Temp 98.4°F | Resp 16 | Ht 62.0 in | Wt 197.0 lb

## 2015-07-16 DIAGNOSIS — E118 Type 2 diabetes mellitus with unspecified complications: Secondary | ICD-10-CM | POA: Diagnosis not present

## 2015-07-16 DIAGNOSIS — Z794 Long term (current) use of insulin: Secondary | ICD-10-CM | POA: Diagnosis not present

## 2015-07-16 DIAGNOSIS — I10 Essential (primary) hypertension: Secondary | ICD-10-CM

## 2015-07-16 LAB — POCT URINALYSIS DIP (DEVICE)
Bilirubin Urine: NEGATIVE
GLUCOSE, UA: 250 mg/dL — AB
Ketones, ur: NEGATIVE mg/dL
LEUKOCYTES UA: NEGATIVE
NITRITE: NEGATIVE
Protein, ur: 100 mg/dL — AB
SPECIFIC GRAVITY, URINE: 1.015 (ref 1.005–1.030)
UROBILINOGEN UA: 0.2 mg/dL (ref 0.0–1.0)
pH: 6 (ref 5.0–8.0)

## 2015-07-16 LAB — GLUCOSE, CAPILLARY: Glucose-Capillary: 210 mg/dL — ABNORMAL HIGH (ref 65–99)

## 2015-07-16 MED ORDER — LISINOPRIL 10 MG PO TABS: 10.0000 mg | ORAL_TABLET | Freq: Every day | ORAL | Status: DC

## 2015-07-16 MED ORDER — CLONIDINE HCL 0.1 MG PO TABS
0.2000 mg | ORAL_TABLET | Freq: Once | ORAL | Status: AC
  Administered 2015-07-16: 0.2 mg via ORAL

## 2015-07-16 NOTE — Patient Instructions (Addendum)
Please take medications prior to arrival  DASH Eating Plan DASH stands for "Dietary Approaches to Stop Hypertension." The DASH eating plan is a healthy eating plan that has been shown to reduce high blood pressure (hypertension). Additional health benefits may include reducing the risk of type 2 diabetes mellitus, heart disease, and stroke. The DASH eating plan may also help with weight loss. WHAT DO I NEED TO KNOW ABOUT THE DASH EATING PLAN? For the DASH eating plan, you will follow these general guidelines:  Choose foods with a percent daily value for sodium of less than 5% (as listed on the food label).  Use salt-free seasonings or herbs instead of table salt or sea salt.  Check with your health care provider or pharmacist before using salt substitutes.  Eat lower-sodium products, often labeled as "lower sodium" or "no salt added."  Eat fresh foods.  Eat more vegetables, fruits, and low-fat dairy products.  Choose whole grains. Look for the word "whole" as the first word in the ingredient list.  Choose fish and skinless chicken or [place] more often than red meat. Limit fish, poultry, and meat to 6 oz (170 g) each day.  Limit sweets, desserts, sugars, and sugary drinks.  Choose heart-healthy fats.  Limit cheese to 1 oz (28 g) per day.  Eat more home-cooked food and less restaurant, buffet, and fast food.  Limit fried foods.  Cook foods using methods other than frying.  Limit canned vegetables. If you do use them, rinse them well to decrease the sodium.  When eating at a restaurant, ask that your food be prepared with less salt, or no salt if possible. WHAT FOODS CAN I EAT? Seek help from a dietitian for individual calorie needs. Grains Whole grain or whole wheat bread. Brown rice. Whole grain or whole wheat pasta. Quinoa, bulgur, and whole grain cereals. Low-sodium cereals. Corn or whole wheat flour tortillas. Whole grain cornbread. Whole grain crackers. Low-sodium  crackers. Vegetables Fresh or frozen vegetables (raw, steamed, roasted, or grilled). Low-sodium or reduced-sodium tomato and vegetable juices. Low-sodium or reduced-sodium tomato sauce and paste. Low-sodium or reduced-sodium canned vegetables.  Fruits All fresh, canned (in natural juice), or frozen fruits. Meat and Other Protein Products Ground beef (85% or leaner), grass-fed beef, or beef trimmed of fat. Skinless chicken or [place]. Ground chicken or [place]. Pork trimmed of fat. All fish and seafood. Eggs. Dried beans, peas, or lentils. Unsalted nuts and seeds. Unsalted canned beans. Dairy Low-fat dairy products, such as skim or 1% milk, 2% or reduced-fat cheeses, low-fat ricotta or cottage cheese, or plain low-fat yogurt. Low-sodium or reduced-sodium cheeses. Fats and Oils Tub margarines without trans fats. Light or reduced-fat mayonnaise and salad dressings (reduced sodium). Avocado. Safflower, olive, or canola oils. Natural peanut or almond butter. Other Unsalted popcorn and pretzels. The items listed above may not be a complete list of recommended foods or beverages. Contact your dietitian for more options. WHAT FOODS ARE NOT RECOMMENDED? Grains White bread. White pasta. White rice. Refined cornbread. Bagels and croissants. Crackers that contain trans fat. Vegetables Creamed or fried vegetables. Vegetables in a cheese sauce. Regular canned vegetables. Regular canned tomato sauce and paste. Regular tomato and vegetable juices. Fruits Dried fruits. Canned fruit in light or heavy syrup. Fruit juice. Meat and Other Protein Products Fatty cuts of meat. Ribs, chicken wings, bacon, sausage, bologna, salami, chitterlings, fatback, hot dogs, bratwurst, and packaged luncheon meats. Salted nuts and seeds. Canned beans with salt. Dairy Whole or 2% milk, cream, half-and-half, and  cream cheese. Whole-fat or sweetened yogurt. Full-fat cheeses or blue cheese. Nondairy creamers and whipped toppings.  Processed cheese, cheese spreads, or cheese curds. Condiments Onion and garlic salt, seasoned salt, table salt, and sea salt. Canned and packaged gravies. Worcestershire sauce. Tartar sauce. Barbecue sauce. Teriyaki sauce. Soy sauce, including reduced sodium. Steak sauce. Fish sauce. Oyster sauce. Cocktail sauce. Horseradish. Ketchup and mustard. Meat flavorings and tenderizers. Bouillon cubes. Hot sauce. Tabasco sauce. Marinades. Taco seasonings. Relishes. Fats and Oils Butter, stick margarine, lard, shortening, ghee, and bacon fat. Coconut, palm kernel, or palm oils. Regular salad dressings. Other Pickles and olives. Salted popcorn and pretzels. The items listed above may not be a complete list of foods and beverages to avoid. Contact your dietitian for more information. WHERE CAN I FIND MORE INFORMATION? National Heart, Lung, and Blood Institute: travelstabloid.com   This information is not intended to replace advice given to you by your health care provider. Make sure you discuss any questions you have with your health care provider.   Document Released: 02/12/2011 Document Revised: 03/16/2014 Document Reviewed: 12/28/2012 Elsevier Interactive Patient Education [employer].

## 2015-07-16 NOTE — Progress Notes (Signed)
Subjective:    Patient ID: Rachel Gonzalez, female    DOB: 03-11-1950, 65 y.o.   MRN: [id number]  HPI  [name], a 65 year old female with a history of hypertension and diabetes mellitus type 2 presents for a follow up of diabetes mellitus.  She is not exercising and is not adherent to low salt diet. [name] did not take medication prior to arrival. Blood pressure is markedly elevated. She does not check blood pressure at home.   Patient denies chest pain, chest pressure/discomfort, fatigue, irregular heart beat, near-syncope, palpitations and tachypnea.  Cardiovascular risk factors include: diabetes mellitus and sedentary lifestyle.    Patient also has a history of uncontrolled type 2 diabetes mellitus. She states that she has been taking medication  consistently. 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 She maintains that wound has healed. She continues to walk with a cane due to gait abnormality. She maintains that she was cleared with orthopedic physician several weeks ago.   Past Medical History  Diagnosis Date  . Diabetes mellitus   . Hypertension   . Anemia   . Renal disorder    Immunization History  Administered Date(s) Administered  . Pneumococcal Polysaccharide-23 02/12/2015  . Td 05/07/2014  . Tdap 11/21/2007   Allergies  Allergen Reactions  . Tramadol Nausea And Vomiting   Social History   Social History  . Marital Status: Married    Spouse Name: N/A  . Number of Children: N/A  . Years of Education: N/A   Occupational History  . Not on file.   Social History Main Topics  . Smoking status: Never Smoker   . Smokeless tobacco: Not on file  . Alcohol Use: No  . Drug Use: No  . Sexual Activity: Not on file   Other Topics Concern  . Not on file   Social History Narrative   From [place].   Married.   Three children, 9 grandchildren.   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   Travels to [place], [address]      Review of Systems  Constitutional: Positive for fatigue and unexpected weight change. Negative for fever.  HENT: Negative.   Respiratory: Negative.   Cardiovascular: Negative.  Negative for chest pain, palpitations and leg swelling.  Gastrointestinal: Negative.   Endocrine: Negative for polydipsia, polyphagia and polyuria.  Genitourinary: Negative.  Negative for hematuria.  Musculoskeletal: Negative.  Myalgias: right foot pain/ ambulates with cane.       S/P right great toe amputation  Skin: Negative.   Allergic/Immunologic: Positive for environmental allergies.  Neurological: Negative.  Negative for dizziness, facial asymmetry, weakness, light-headedness and numbness.  Hematological: Negative.   Psychiatric/Behavioral: Negative.        Objective:   Physical Exam  Constitutional: She appears well-developed and well-nourished. She is active. She does not have a sickly appearance.  HENT:  Head: Normocephalic and atraumatic.  Nose: Mucosal edema present.  Eyes: Lids are normal.  Neck: Normal range of motion and full passive range of motion without pain. Neck supple.  Cardiovascular: Normal rate, regular rhythm, S1 normal, normal heart sounds and normal pulses.   Pulmonary/Chest: Effort normal and breath sounds normal.  Abdominal: Soft. Normal appearance and bowel sounds are normal. There is no tenderness.  Musculoskeletal:       Right ankle: She exhibits decreased range of motion. She exhibits no swelling and normal pulse.  Right great toe  amputation  Lymphadenopathy:       Head (right side): No submental and no submandibular adenopathy present.       Head (left side): No submental and no submandibular adenopathy present.  Neurological: She is alert.  Skin: Skin is warm, dry and intact.  Hyperpigmented scar to left anterior neck  Psychiatric: Her speech is normal and behavior is normal. Judgment and thought content normal. Cognition and  memory are normal.      BP 196/98 mmHg  Pulse 84  Temp(Src) 98.4 F (36.9 C) (Oral)  Resp 16  Ht 5\' 2"  (1.575 m)  Wt 197 lb (89.359 kg)  BMI 36.02 kg/m2  SpO2 98% Assessment & Plan:  1. Essential hypertension There have been some probable compliance issues here. I have discussed with her the great importance of following the treatment plan exactly as directed in order to achieve a good medical outcome. Printed off a copy of medications and discussed at length. I will increase Lisinopril to 10 mg daily. Will recheck blood pressure in 1 week.  - cloNIDine (CATAPRES) tablet 0.2 mg; Take 2 tablets (0.2 mg total) by mouth once. - POCT urinalysis dip (device)   2. Type 2 diabetes mellitus with complication, with long-term current use of insulin (HCC) - Glucose, capillary - lisinopril (PRINIVIL,ZESTRIL) 10 MG tablet; Take 1 tablet (10 mg total) by mouth daily.  Dispense: 30 tablet; Refill: 1   RTC: 1 week for blood pressure check and 1 month for hypertension   Rachel Gonzalez M, FNP    The patient was given clear instructions to go to ER or return to medical center if symptoms do not improve, worsen or new problems develop. The patient verbalized understanding. Will notify patient with laboratory results.

## 2015-08-22 ENCOUNTER — Ambulatory Visit (INDEPENDENT_AMBULATORY_CARE_PROVIDER_SITE_OTHER): Payer: Medicaid Other | Admitting: Family Medicine

## 2015-08-22 ENCOUNTER — Encounter: Payer: Self-pay | Admitting: Family Medicine

## 2015-08-22 VITALS — BP 168/78 | HR 87 | Temp 98.4°F | Resp 16 | Ht 62.0 in | Wt 197.0 lb

## 2015-08-22 DIAGNOSIS — E1165 Type 2 diabetes mellitus with hyperglycemia: Secondary | ICD-10-CM

## 2015-08-22 DIAGNOSIS — IMO0002 Reserved for concepts with insufficient information to code with codable children: Secondary | ICD-10-CM

## 2015-08-22 DIAGNOSIS — J309 Allergic rhinitis, unspecified: Secondary | ICD-10-CM

## 2015-08-22 DIAGNOSIS — I10 Essential (primary) hypertension: Secondary | ICD-10-CM | POA: Diagnosis not present

## 2015-08-22 DIAGNOSIS — Z794 Long term (current) use of insulin: Secondary | ICD-10-CM

## 2015-08-22 DIAGNOSIS — E118 Type 2 diabetes mellitus with unspecified complications: Secondary | ICD-10-CM

## 2015-08-22 LAB — POCT URINALYSIS DIP (DEVICE)
Bilirubin Urine: NEGATIVE
Glucose, UA: 100 mg/dL — AB
NITRITE: NEGATIVE
PH: 5.5 (ref 5.0–8.0)
SPECIFIC GRAVITY, URINE: 1.025 (ref 1.005–1.030)
UROBILINOGEN UA: 0.2 mg/dL (ref 0.0–1.0)

## 2015-08-22 LAB — GLUCOSE, CAPILLARY: Glucose-Capillary: 178 mg/dL — ABNORMAL HIGH (ref 65–99)

## 2015-08-22 MED ORDER — HYDROCHLOROTHIAZIDE 12.5 MG PO TABS: 12.5000 mg | ORAL_TABLET | Freq: Every day | ORAL | Status: DC

## 2015-08-22 MED ORDER — LEVOCETIRIZINE DIHYDROCHLORIDE 5 MG PO TABS: 5.0000 mg | ORAL_TABLET | Freq: Every evening | ORAL | Status: DC

## 2015-08-22 NOTE — Progress Notes (Signed)
Subjective:    Patient ID: Rachel Gonzalez, female    DOB: 05/23/1950, 65 y.o.   MRN: [id number]  HPI  [name], a 65 year old female with a history of hypertension and diabetes mellitus type 2 presents for a follow up of diabetes mellitus.  She is not exercising and is not adherent to low salt diet. Ms. Kiedrowski has not been taking Hydrochlorothiazide, she did not pick up medication from pharmacy . Blood pressure is markedly elevated. She does not check blood pressure at home.   Patient denies chest pain, chest pressure/discomfort, fatigue, irregular heart beat, near-syncope, palpitations and tachypnea.  Cardiovascular risk factors include: diabetes mellitus and sedentary lifestyle.    Patient also has a history of uncontrolled type 2 diabetes mellitus. She states that she has been taking medication  consistently. 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 She states that she has been using Lantus 20 units HS and sliding scale consistently. She did not bring glucometer to appointment.  Patient denies foot ulcerations, increase appetite, nausea, paresthesia of the feet, polydipsia, polyuria, visual disturbances, vomitting and weight loss.    Past Medical History  Diagnosis Date  . Diabetes mellitus   . Hypertension   . Anemia   . Renal disorder    Immunization History  Administered Date(s) Administered  . Pneumococcal Polysaccharide-23 02/12/2015  . Td 05/07/2014  . Tdap 11/21/2007   Allergies  Allergen Reactions  . Tramadol Nausea And Vomiting   Social History   Social History  . Marital Status: Married    Spouse Name: N/A  . Number of Children: N/A  . Years of Education: N/A   Occupational History  . Not on file.   Social History Main Topics  . Smoking status: Never Smoker   . Smokeless tobacco: Not on file  . Alcohol Use: No  . Drug Use: No  . Sexual Activity: Not on file   Other Topics Concern  . Not on file   Social History Narrative    From [place].   Married.   Three children, 9 grandchildren.   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   Travels to [place], [address]      Review of Systems  Constitutional: Positive for fatigue and unexpected weight change. Negative for fever.  HENT: Negative.   Respiratory: Negative.   Cardiovascular: Negative.  Negative for chest pain, palpitations and leg swelling.  Gastrointestinal: Negative.   Endocrine: Negative for polydipsia, polyphagia and polyuria.  Genitourinary: Negative.  Negative for hematuria.  Musculoskeletal: Negative.  Myalgias: right foot pain/ ambulates with cane.       S/P right great toe amputation  Skin: Negative.   Allergic/Immunologic: Positive for environmental allergies.  Neurological: Negative.  Negative for dizziness, facial asymmetry, weakness, light-headedness and numbness.  Hematological: Negative.   Psychiatric/Behavioral: Negative.        Objective:   Physical Exam  Constitutional: She appears well-developed and well-nourished. She is active. She does not have a sickly appearance.  HENT:  Head: Normocephalic and atraumatic.  Nose: Mucosal edema present.  Eyes: Lids are normal.  Neck: Normal range of motion and full passive range of motion without pain. Neck supple.  Cardiovascular: Normal rate, regular rhythm, S1 normal, normal heart sounds and normal pulses.   Pulmonary/Chest: Effort normal and breath sounds normal.  Abdominal: Soft. Normal appearance and bowel sounds are normal. There is no tenderness.  Musculoskeletal:  Right ankle: She exhibits decreased range of motion. She exhibits no swelling and normal pulse.  Right great toe amputation  Lymphadenopathy:       Head (right side): No submental and no submandibular adenopathy present.       Head (left side): No submental and no submandibular adenopathy present.  Neurological: She is alert.  Skin: Skin is warm, dry and intact.   Hyperpigmented scar to left anterior neck  Psychiatric: Her speech is normal and behavior is normal. Judgment and thought content normal. Cognition and memory are normal.      BP 168/78 mmHg  Pulse 87  Temp(Src) 98.4 F (36.9 C) (Oral)  Resp 16  Ht 5\' 2"  (1.575 m)  Wt 197 lb (89.359 kg)  BMI 36.02 kg/m2  SpO2 98% Assessment & Plan:   1. Uncontrolled type 2 diabetes mellitus with complication, with long-term current use of insulin (HCC) Continue to take medications consistently. Blood sugar 168 on arrival she had breakfast 2 hours ago with 6 units of Novolog per sliding scale. Will re-check labs in 1 month - COMPLETE METABOLIC PANEL WITH GFR; Future - Lipid Panel; Future - Hemoglobin A1c; Future - Ambulatory referral to diabetic education  2. Essential hypertension Blood pressure is not at goal on current medication regimen.  There have been some probable noncompliance issues here. I have discussed with her the great importance of following the treatment plan exactly as directed in order to achieve a good medical outcome. - hydrochlorothiazide (HYDRODIURIL) 12.5 MG tablet; Take 1 tablet (12.5 mg total) by mouth daily.  Dispense: 90 tablet; Refill: 1 - COMPLETE METABOLIC PANEL WITH GFR; Future  3. Allergic rhinitis, unspecified allergic rhinitis type - levocetirizine (XYZAL) 5 MG tablet; Take 1 tablet (5 mg total) by mouth every evening.  Dispense: 30 tablet; Refill: 11   ESTRIL) 10 MG tablet; Take 1 tablet (10 mg total) by mouth daily.  Dispense: 30 tablet; Refill: 1    RTC: 1 month for blood pressure check and labs. Will schedule a 2 month follow-up   Amitai Delaughter M, FNP    The patient was given clear instructions to go to ER or return to medical center if symptoms do not improve, worsen or new problems develop. The patient verbalized understanding. Will notify patient with laboratory results.

## 2015-08-22 NOTE — Patient Instructions (Signed)
Basic Carbohydrate Counting for Diabetes Mellitus  Carbohydrate counting is a method for keeping track of the amount of carbohydrates you eat. Eating carbohydrates naturally increases the level of sugar (glucose) in your blood, so it is important for you to know the amount that is okay for you to have in every meal. Carbohydrate counting helps keep the level of glucose in your blood within normal limits. The amount of carbohydrates allowed is different for every person. A dietitian can help you calculate the amount that is right for you. Once you know the amount of carbohydrates you can have, you can count the carbohydrates in the foods you want to eat.  Carbohydrates are found in the following foods:  · Grains, such as breads and cereals.  · Dried beans and soy products.  · Starchy vegetables, such as potatoes, peas, and corn.  · Fruit and fruit juices.  · Milk and yogurt.  · Sweets and snack foods, such as cake, cookies, candy, chips, soft drinks, and fruit drinks.  CARBOHYDRATE COUNTING  There are two ways to count the carbohydrates in your food. You can use either of the methods or a combination of both.  Reading the "Nutrition Facts" on Packaged Food  The "Nutrition Facts" is an area that is included on the labels of almost all packaged food and beverages in the [place]. It includes the serving size of that food or beverage and information about the nutrients in each serving of the food, including the grams (g) of carbohydrate per serving.   Decide the number of servings of this food or beverage that you will be able to eat or drink. Multiply that number of servings by the number of grams of carbohydrate that is listed on the label for that serving. The total will be the amount of carbohydrates you will be having when you eat or drink this food or beverage.  Learning Standard Serving Sizes of Food  When you eat food that is not packaged or does not include "Nutrition Facts" on the label, you need to  measure the servings in order to count the amount of carbohydrates. A serving of most carbohydrate-rich foods contains about 15 g of carbohydrates. The following list includes serving sizes of carbohydrate-rich foods that provide 15 g of carbohydrate per serving:    · 1 slice of bread (1 oz) or 1 six-inch tortilla.    · ½ of a hamburger bun or English muffin.  · 4-6 crackers.  · ¾ cup unsweetened dry cereal.    · ½ cup hot cereal.  · ? cup rice or pasta.    · ½ cup mashed potatoes or ¼ of a large baked potato.  · 1 cup fresh fruit or one small piece of fruit.    · ½ cup canned or frozen fruit or fruit juice.  · 1 cup milk.  · ? cup plain fat-free yogurt or yogurt sweetened with artificial sweeteners.  · ½ cup cooked dried beans or starchy vegetable, such as peas, corn, or potatoes.    Decide the number of standard-size servings that you will eat. Multiply that number of servings by 15 (the grams of carbohydrates in that serving). For example, if you eat 2 cups of strawberries, you will have eaten 2 servings and 30 g of carbohydrates (2 servings x 15 g = 30 g). For foods such as soups and casseroles, in which more than one food is mixed in, you will need to count the carbohydrates in each food that is included.  EXAMPLE OF CARBOHYDRATE   COUNTING  Sample Dinner   · 3 oz chicken breast.  · ? cup of brown rice.  · ½ cup of corn.  · 1 cup milk.    · 1 cup strawberries with sugar-free whipped topping.    Carbohydrate Calculation   Step 1: Identify the foods that contain carbohydrates:   · Rice.    · Corn.    · Milk.    · Strawberries.  Step 2: Calculate the number of servings eaten of each:   · 2 servings of rice.    · 1 serving of corn.    · 1 serving of milk.    · 1 serving of strawberries.  Step 3:  Multiply each of those number of servings by 15 g:   · 2 servings of rice x 15 g = 30 g.    · 1 serving of corn x 15 g = 15 g.    · 1 serving of milk x 15 g = 15 g.    · 1 serving of strawberries x 15 g = 15 g.  Step 4: Add  together all of the amounts to find the total grams of carbohydrates eaten: 30 g + 15 g + 15 g + 15 g = 75 g.     This information is not intended to replace advice given to you by your health care provider. Make sure you discuss any questions you have with your health care provider.     Document Released: 02/23/2005 Document Revised: 03/16/2014 Document Reviewed: 01/20/2013  Elsevier Interactive Patient Education ©2016 Elsevier Inc.  Blood Glucose Monitoring, Adult  Monitoring your blood glucose (also know as blood sugar) helps you to manage your diabetes. It also helps you and your health care provider monitor your diabetes and determine how well your treatment plan is working.  WHY SHOULD YOU MONITOR YOUR BLOOD GLUCOSE?  · It can help you understand how food, exercise, and medicine affect your blood glucose.  · It allows you to know what your blood glucose is at any given moment. You can quickly tell if you are having low blood glucose (hypoglycemia) or high blood glucose (hyperglycemia).  · It can help you and your health care provider know how to adjust your medicines.  · It can help you understand how to manage an illness or adjust medicine for exercise.  WHEN SHOULD YOU TEST?  Your health care provider will help you decide how often you should check your blood glucose. This may depend on the type of diabetes you have, your diabetes control, or the types of medicines you are taking. Be sure to write down all of your blood glucose readings so that this information can be reviewed with your health care provider. See below for examples of testing times that your health care provider may suggest.  Type 1 Diabetes  · Test at least 2 times per day if your diabetes is well controlled, if you are using an insulin pump, or if you perform multiple daily injections.  · If your diabetes is not well controlled or if you are sick, you may need to test more often.  · It is a good idea to also test:    Before every insulin  injection.    Before and after exercise.    Between meals and 2 hours after a meal.    Occasionally between 2:00 a.m. and 3:00 a.m.  Type 2 Diabetes  · If you are taking insulin, test at least 2 times per day. However, it is best to test before every insulin injection.  · If   you take medicines by mouth (orally), test 2 times a day.  · If you are on a controlled diet, test once a day.  · If your diabetes is not well controlled or if you are sick, you may need to monitor more often.  HOW TO MONITOR YOUR BLOOD GLUCOSE  Supplies Needed  · Blood glucose meter.  · Test strips for your meter. Each meter has its own strips. You must use the strips that go with your own meter.  · A pricking needle (lancet).  · A device that holds the lancet (lancing device).  · A journal or log book to write down your results.  Procedure  · Wash your hands with soap and water. Alcohol is not preferred.  · Prick the side of your finger (not the tip) with the lancet.  · Gently milk the finger until a small drop of blood appears.  · Follow the instructions that come with your meter for inserting the test strip, applying blood to the strip, and using your blood glucose meter.  Other Areas to Get Blood for Testing  Some meters allow you to use other areas of your body (other than your finger) to test your blood. These areas are called alternative sites. The most common alternative sites are:  · The forearm.  · The thigh.  · The back area of the lower leg.  · The palm of the hand.  The blood flow in these areas is slower. Therefore, the blood glucose values you get may be delayed, and the numbers are different from what you would get from your fingers. Do not use alternative sites if you think you are having hypoglycemia. Your reading will not be accurate. Always use a finger if you are having hypoglycemia. Also, if you cannot feel your lows (hypoglycemia unawareness), always use your fingers for your blood glucose checks.  ADDITIONAL TIPS FOR  GLUCOSE MONITORING  · Do not reuse lancets.  · Always carry your supplies with you.  · All blood glucose meters have a 24-hour "hotline" number to call if you have questions or need help.  · Adjust (calibrate) your blood glucose meter with a control solution after finishing a few boxes of strips.  BLOOD GLUCOSE RECORD KEEPING  It is a good idea to keep a daily record or log of your blood glucose readings. Most glucose meters, if not all, keep your glucose records stored in the meter. Some meters come with the ability to download your records to your home computer. Keeping a record of your blood glucose readings is especially helpful if you are wanting to look for patterns. Make notes to go along with the blood glucose readings because you might forget what happened at that exact time. Keeping good records helps you and your health care provider to work together to achieve good diabetes management.      This information is not intended to replace advice given to you by your health care provider. Make sure you discuss any questions you have with your health care provider.     Document Released: 02/26/2003 Document Revised: 03/16/2014 Document Reviewed: 07/18/2012  Elsevier Interactive Patient Education ©2016 Elsevier Inc.

## 2015-09-16 ENCOUNTER — Telehealth: Payer: Self-pay | Admitting: *Deleted

## 2015-09-16 ENCOUNTER — Other Ambulatory Visit: Payer: Self-pay | Admitting: Family Medicine

## 2015-09-16 DIAGNOSIS — E1140 Type 2 diabetes mellitus with diabetic neuropathy, unspecified: Secondary | ICD-10-CM

## 2015-09-16 MED ORDER — GABAPENTIN 300 MG PO CAPS: 300.0000 mg | ORAL_CAPSULE | Freq: Three times a day (TID) | ORAL | Status: DC

## 2015-09-16 NOTE — Telephone Encounter (Signed)
Refill for gabapentin sent into pharmacy. Thanks!  

## 2015-09-16 NOTE — Telephone Encounter (Signed)
Patient called and states she  needs a refill of her Gabapentin. Her pharmacy is [facility] on [address]. Thanks

## 2015-09-23 ENCOUNTER — Other Ambulatory Visit: Payer: Self-pay

## 2015-09-23 ENCOUNTER — Other Ambulatory Visit (INDEPENDENT_AMBULATORY_CARE_PROVIDER_SITE_OTHER): Payer: Medicaid Other

## 2015-09-23 DIAGNOSIS — E1165 Type 2 diabetes mellitus with hyperglycemia: Secondary | ICD-10-CM

## 2015-09-23 DIAGNOSIS — Z794 Long term (current) use of insulin: Secondary | ICD-10-CM

## 2015-09-23 DIAGNOSIS — E118 Type 2 diabetes mellitus with unspecified complications: Secondary | ICD-10-CM

## 2015-09-23 DIAGNOSIS — I10 Essential (primary) hypertension: Secondary | ICD-10-CM

## 2015-09-23 DIAGNOSIS — E1140 Type 2 diabetes mellitus with diabetic neuropathy, unspecified: Secondary | ICD-10-CM

## 2015-09-23 DIAGNOSIS — IMO0002 Reserved for concepts with insufficient information to code with codable children: Secondary | ICD-10-CM

## 2015-09-23 LAB — HEMOGLOBIN A1C
HEMOGLOBIN A1C: 11.8 % — AB (ref <5.7)
MEAN PLASMA GLUCOSE: 292 mg/dL

## 2015-09-23 MED ORDER — GABAPENTIN 300 MG PO CAPS
300.0000 mg | ORAL_CAPSULE | Freq: Three times a day (TID) | ORAL | Status: DC
Start: 2015-09-23 — End: 2016-10-14

## 2015-09-23 NOTE — Telephone Encounter (Signed)
Refill for gabapentin sent into pharmacy. Thanks!  

## 2015-09-24 LAB — COMPLETE METABOLIC PANEL WITH GFR
ALBUMIN: 3.3 g/dL — AB (ref 3.6–5.1)
ALK PHOS: 149 U/L — AB (ref 33–130)
ALT: 7 U/L (ref 6–29)
AST: 10 U/L (ref 10–35)
BUN: 31 mg/dL — AB (ref 7–25)
CHLORIDE: 104 mmol/L (ref 98–110)
CO2: 23 mmol/L (ref 20–31)
Calcium: 9.1 mg/dL (ref 8.6–10.4)
Creat: 1.15 mg/dL — ABNORMAL HIGH (ref 0.50–0.99)
GFR, Est African American: 59 mL/min — ABNORMAL LOW (ref >=60)
GFR, Est Non African American: 51 mL/min — ABNORMAL LOW (ref >=60)
GLUCOSE: 291 mg/dL — AB (ref 65–99)
POTASSIUM: 4.9 mmol/L (ref 3.5–5.3)
SODIUM: 142 mmol/L (ref 135–146)
Total Bilirubin: 0.2 mg/dL (ref 0.2–1.2)
Total Protein: 6 g/dL — ABNORMAL LOW (ref 6.1–8.1)

## 2015-09-24 LAB — LIPID PANEL
CHOL/HDL RATIO: 2.1 ratio (ref <=5.0)
Cholesterol: 192 mg/dL (ref 125–200)
HDL: 93 mg/dL (ref >=46)
LDL CALC: 86 mg/dL (ref <130)
Triglycerides: 66 mg/dL (ref <150)
VLDL: 13 mg/dL (ref <30)

## 2015-10-14 ENCOUNTER — Other Ambulatory Visit: Payer: Self-pay | Admitting: Family Medicine

## 2015-10-14 DIAGNOSIS — K219 Gastro-esophageal reflux disease without esophagitis: Secondary | ICD-10-CM

## 2015-10-21 ENCOUNTER — Encounter: Payer: Self-pay | Admitting: Family Medicine

## 2015-10-21 ENCOUNTER — Ambulatory Visit (INDEPENDENT_AMBULATORY_CARE_PROVIDER_SITE_OTHER): Payer: Medicaid Other | Admitting: Family Medicine

## 2015-10-21 VITALS — BP 140/90 | HR 78 | Temp 98.5°F | Ht 64.0 in | Wt 197.0 lb

## 2015-10-21 DIAGNOSIS — Z794 Long term (current) use of insulin: Secondary | ICD-10-CM | POA: Diagnosis not present

## 2015-10-21 DIAGNOSIS — E118 Type 2 diabetes mellitus with unspecified complications: Secondary | ICD-10-CM

## 2015-10-21 LAB — GLUCOSE, CAPILLARY: GLUCOSE-CAPILLARY: 319 mg/dL — AB (ref 65–99)

## 2015-10-21 MED ORDER — INSULIN REGULAR HUMAN 100 UNIT/ML IJ SOLN
15.0000 [IU] | Freq: Once | INTRAMUSCULAR | Status: AC
  Administered 2015-10-21: 15 [IU] via SUBCUTANEOUS

## 2015-10-21 NOTE — Patient Instructions (Signed)
Be careful of sweets and other carbs in diet Make an appointment for mid Oct. For recheck of BP

## 2015-10-21 NOTE — Progress Notes (Signed)
Rachel Gonzalez, is a 65 y.o. female  QRF:[id number]  [id number]:[id number]  DOB - [birth year]  CC:  Chief Complaint  Patient presents with  . Follow-up    diabetes and HTN follow up, 147 this morning glucose at home       HPI: Rachel Gonzalez is a 65 y.o. female here for follow-up diabetes. I am unsure why she scheduled for today as her last A1C was in July. In addition to diabetes she has a history of hypertension and CKD.She is on Lantus 20 units daily and Novolog 6 units tid with meals. She reports before breakfast this morning her blood sugar was 147. It is not 315. She reports she did take Novolog with breakfast. She is on lisinopril and hctz for hypertension and renal protection. She is not on a statin. She reports not needing refills today.  Health Maintenance: Is in need of PAP. Reports mammo up to date. She reports a diabetic eye exam in March.  Allergies  Allergen Reactions  . Tramadol Nausea And Vomiting   Past Medical History:  Diagnosis Date  . Anemia   . Diabetes mellitus   . Hypertension   . Renal disorder    Current Outpatient Prescriptions on File Prior to Visit  Medication Sig Dispense Refill  . acetaminophen (TYLENOL) 500 MG tablet Take 1 tablet (500 mg total) by mouth every 4 (four) hours as needed. 21 tablet 0  . Blood Glucose Monitoring Suppl (ACCU-CHEK AVIVA PLUS) w/Device KIT 1 Device by Does not apply route once. Use as directed to check blood sugar 1 kit 0  . docusate sodium (COLACE) 100 MG capsule Take 1 capsule (100 mg total) by mouth 2 (two) times daily. 10 capsule 0  . ferrous sulfate 325 (65 FE) MG tablet Take 1 tablet (325 mg total) by mouth daily with breakfast.  3  . gabapentin (NEURONTIN) 300 MG capsule Take 1 capsule (300 mg total) by mouth 3 (three) times daily. 90 capsule 3  . glucose blood (ACCU-CHEK AVIVA) test strip Use to check blood sugar 3 times daily and at bedtime. 100 each 12  . hydrochlorothiazide (HYDRODIURIL) 12.5 MG tablet Take 1 tablet  (12.5 mg total) by mouth daily. 90 tablet 1  . insulin aspart (NOVOLOG) cartridge Inject 6 Units into the skin 3 (three) times daily with meals. 15 mL 3  . Insulin Glargine (LANTUS SOLOSTAR) 100 UNIT/ML Solostar Pen Inject 20 Units into the skin daily at 10 pm. 15 mL 3  . Insulin Pen Needle 29G X 12.7MM MISC 1 each by Does not apply route 4 (four) times daily -  before meals and at bedtime. 100 each 11  . Lancet Devices (ACCU-CHEK SOFTCLIX) lancets Use as instructed 100 each 12  . levocetirizine (XYZAL) 5 MG tablet Take 1 tablet (5 mg total) by mouth every evening. 30 tablet 11  . lisinopril (PRINIVIL,ZESTRIL) 10 MG tablet TAKE ONE TABLET BY MOUTH ONCE DAILY 30 tablet 0  . omeprazole (PRILOSEC) 20 MG capsule TAKE ONE CAPSULE BY MOUTH  DAILY 30 capsule 3  . polyethylene glycol (MIRALAX / GLYCOLAX) packet Take 17 g by mouth 2 (two) times daily. Take 17g PO BID until you have daily soft bowel movements, then you may decrease dose to 17g PO QD 14 each 0  . Blood Glucose Monitoring Suppl (ONE TOUCH ULTRA SYSTEM KIT) w/Device KIT Test three times daily. Dx Type 2 Diabetes. 1 each 0  . glucose blood test strip Test three times daily. One Touch Ultra test  strips. Dx. Type 2 Diabetes. (Patient not taking: Reported on 10/21/2015) 100 each 12  . omeprazole (PRILOSEC) 20 MG capsule TAKE ONE CAPSULE BY MOUTH DAILY (Patient not taking: Reported on 10/21/2015) 30 capsule 0  . ONE TOUCH LANCETS MISC Test 3 times daily with glucometer. Dx Type 2 diabetes with unspecified complications. 200 each 5   No current facility-administered medications on file prior to visit.    Family History  Problem Relation Age of Onset  . Diabetes Mother    Social History   Social History  . Marital status: Married    Spouse name: N/A  . Number of children: N/A  . Years of education: N/A   Occupational History  . Not on file.   Social History Main Topics  . Smoking status: Never Smoker  . Smokeless tobacco: Never Used  .  Alcohol use No  . Drug use: No  . Sexual activity: Not on file   Other Topics Concern  . Not on file   Social History Narrative   From [place].   Married.   Three children, 9 grandchildren.   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   Travels to [place], [place]       Review of Systems: Constitutional: Negative for fever, chills, appetite change, weight loss,  Fatigue. Skin: Negative for rashes or lesions of concern. HENT: Negative ear discharge.nose bleeds. Positive for left ear pain due to dental issue. Eyes: Negative for pain, discharge, redness, itching and visual disturbance. Neck: Negative for pain, stiffness Respiratory: Negative for cough, shortness of breath,   Cardiovascular: Negative for chest pain, palpitations. Occ swelling of feet and leg Gastrointestinal: Negative for abdominal pain, nausea, vomiting, diarrhea, constipations Genitourinary: Negative for dysuria, urgency, frequency, hematuria,  Musculoskeletal: Negative for back pain, joint pain, joint  swelling.Negative for weakness.Reports falling recently Neurological: Negative for dizziness, tremors, seizures, syncope,   light-headedness, numbness and headaches.  Hematological: Negative for easy bruising or bleeding Psychiatric/Behavioral: Negative for depression, anxiety, decreased concentration, confusion   Objective:   Vitals:   10/21/15 1026 10/21/15 1109  BP: (!) 166/70 140/90  Pulse: 78   Temp: 98.5 F (36.9 C)     Physical Exam: Constitutional: Patient appears well-developed and well-nourished. No distress. HENT: Normocephalic, atraumatic, External right and left ear normal. Oropharynx is clear and moist.  Eyes: Conjunctivae and EOM are normal. PERRLA, no scleral icterus. Neck: Normal ROM. Neck supple. No lymphadenopathy, No thyromegaly. CVS: RRR, S1/S2 +, no murmurs, no gallops, no rubs Pulmonary: Effort and breath sounds normal, no stridor,  rhonchi, wheezes, rales.  Abdominal: Soft. Normoactive BS,, no distension, tenderness, rebound or guarding.  Musculoskeletal: Normal range of motion. No edema and no tenderness. Using a cane for support Neuro: Alert.Normal muscle tone coordination. Non-focal Skin: Skin is warm and dry. No rash noted. Not diaphoretic. No erythema. No pallor. Psychiatric: Normal mood and affect. Behavior, judgment, thought content normal.  Lab Results  Component Value Date   WBC 5.3 06/10/2015   HGB 10.5 (L) 06/10/2015   HCT 34.9 (L) 06/10/2015   MCV 87.0 06/10/2015   PLT 249 06/10/2015   Lab Results  Component Value Date   CREATININE 1.15 (H) 09/23/2015   BUN 31 (H) 09/23/2015   NA 142 09/23/2015   K 4.9 09/23/2015   CL 104 09/23/2015   CO2 23 09/23/2015    Lab Results  Component Value Date   HGBA1C 11.8 (H) 09/23/2015   Lipid Panel  Component Value Date/Time   CHOL 192 09/23/2015 0900   TRIG 66 09/23/2015 0900   HDL 93 09/23/2015 0900   CHOLHDL 2.1 09/23/2015 0900   VLDL 13 09/23/2015 0900   LDLCALC 86 09/23/2015 0900       Assessment and plan:   1. Type 2 diabetes mellitus with complication, with long-term current use of insulin (HCC)  - insulin regular (NOVOLIN R,HUMULIN R) 100 units/mL injection 15 Units; Inject 0.15 mLs (15 Units total) into the skin once.   Return in about 2 months (around 12/21/2015).  The patient was given clear instructions to go to ER or return to medical center if symptoms don't improve, worsen or new problems develop. The patient verbalized understanding.    Micheline Chapman FNP  10/21/2015, 11:57 AM

## 2015-11-18 ENCOUNTER — Other Ambulatory Visit: Payer: Self-pay | Admitting: Family Medicine

## 2015-12-18 ENCOUNTER — Other Ambulatory Visit: Payer: Self-pay | Admitting: Family Medicine

## 2015-12-24 ENCOUNTER — Ambulatory Visit: Payer: Medicaid Other | Admitting: Family Medicine

## 2016-01-14 ENCOUNTER — Ambulatory Visit: Payer: Medicaid Other

## 2016-01-18 ENCOUNTER — Other Ambulatory Visit: Payer: Self-pay | Admitting: Family Medicine

## 2016-01-27 ENCOUNTER — Encounter: Payer: Self-pay | Admitting: Family Medicine

## 2016-01-27 ENCOUNTER — Ambulatory Visit (INDEPENDENT_AMBULATORY_CARE_PROVIDER_SITE_OTHER): Payer: Medicaid Other | Admitting: Family Medicine

## 2016-01-27 VITALS — BP 122/84 | HR 88 | Temp 97.7°F | Resp 12 | Ht 64.0 in | Wt 205.0 lb

## 2016-01-27 DIAGNOSIS — Z1159 Encounter for screening for other viral diseases: Secondary | ICD-10-CM

## 2016-01-27 DIAGNOSIS — E118 Type 2 diabetes mellitus with unspecified complications: Secondary | ICD-10-CM | POA: Diagnosis not present

## 2016-01-27 DIAGNOSIS — E1165 Type 2 diabetes mellitus with hyperglycemia: Secondary | ICD-10-CM | POA: Diagnosis not present

## 2016-01-27 LAB — COMPLETE METABOLIC PANEL WITH GFR
ALT: 6 U/L (ref 6–29)
AST: 9 U/L — ABNORMAL LOW (ref 10–35)
Albumin: 3.5 g/dL — ABNORMAL LOW (ref 3.6–5.1)
Alkaline Phosphatase: 169 U/L — ABNORMAL HIGH (ref 33–130)
BUN: 47 mg/dL — ABNORMAL HIGH (ref 7–25)
CHLORIDE: 102 mmol/L (ref 98–110)
CO2: 26 mmol/L (ref 20–31)
Calcium: 8.9 mg/dL (ref 8.6–10.4)
Creat: 1.48 mg/dL — ABNORMAL HIGH (ref 0.50–0.99)
GFR, EST NON AFRICAN AMERICAN: 37 mL/min — AB (ref >=60)
GFR, Est African American: 43 mL/min — ABNORMAL LOW (ref >=60)
GLUCOSE: 385 mg/dL — AB (ref 65–99)
POTASSIUM: 5.3 mmol/L (ref 3.5–5.3)
SODIUM: 134 mmol/L — AB (ref 135–146)
Total Bilirubin: 0.3 mg/dL (ref 0.2–1.2)
Total Protein: 6.3 g/dL (ref 6.1–8.1)

## 2016-01-27 LAB — POCT GLYCOSYLATED HEMOGLOBIN (HGB A1C): Hemoglobin A1C: 14.6

## 2016-01-27 LAB — CBC WITH DIFFERENTIAL/PLATELET
BASOS ABS: 57 {cells}/uL (ref 0–200)
Basophils Relative: 1 %
EOS ABS: 114 {cells}/uL (ref 15–500)
EOS PCT: 2 %
HCT: 34.3 % — ABNORMAL LOW (ref 35.0–45.0)
HEMOGLOBIN: 10.5 g/dL — AB (ref 11.7–15.5)
LYMPHS ABS: 1596 {cells}/uL (ref 850–3900)
Lymphocytes Relative: 28 %
MCH: 26.8 pg — AB (ref 27.0–33.0)
MCHC: 30.6 g/dL — ABNORMAL LOW (ref 32.0–36.0)
MCV: 87.5 fL (ref 80.0–100.0)
MPV: 11.4 fL (ref 7.5–12.5)
Monocytes Absolute: 285 cells/uL (ref 200–950)
Monocytes Relative: 5 %
NEUTROS ABS: 3648 {cells}/uL (ref 1500–7800)
Neutrophils Relative %: 64 %
Platelets: 224 10*3/uL (ref 140–400)
RBC: 3.92 MIL/uL (ref 3.80–5.10)
RDW: 13.3 % (ref 11.0–15.0)
WBC: 5.7 10*3/uL (ref 3.8–10.8)

## 2016-01-27 MED ORDER — HYDROCHLOROTHIAZIDE 25 MG PO TABS: 25.0000 mg | ORAL_TABLET | Freq: Every day | ORAL | 3 refills | Status: DC

## 2016-01-27 MED ORDER — LISINOPRIL 10 MG PO TABS: 10.0000 mg | ORAL_TABLET | Freq: Every day | ORAL | 1 refills | Status: DC

## 2016-01-27 MED ORDER — INSULIN ASPART 100 UNIT/ML CARTRIDGE (PENFILL): 6.0000 [IU] | Freq: Three times a day (TID) | SUBCUTANEOUS | 3 refills | Status: DC

## 2016-01-27 MED ORDER — INSULIN GLARGINE 100 UNIT/ML SOLOSTAR PEN: 20.0000 [IU] | PEN_INJECTOR | Freq: Every day | SUBCUTANEOUS | 3 refills | Status: DC

## 2016-01-27 NOTE — Patient Instructions (Addendum)
Increase Lantus to 25 units at bedtime Increase Novolog to 8 units with each meal.  Let us know if BS run less than 100 in morning. Follow-up in 3 months.

## 2016-01-28 LAB — HEPATITIS C ANTIBODY: HCV AB: NEGATIVE

## 2016-01-29 NOTE — Progress Notes (Signed)
Rachel Gonzalez, is a 65 y.o. female  DDU:[id number]  CBJ:[id number]  DOB - May 08, 1950  CC:  Chief Complaint  Patient presents with  . Diabetes    having some balance issues recently  . arm pain    6/10 arm pain on right side   . Leg Pain    6/10 leg pain on right side        HPI: Rachel Gonzalez is a 65 y.o. female here to  follow-up diabetes, hypertension, anemia and a non-specific renal disorder. She reports some issues with balance recently and continued right arm and leg pain. She is currently on Lantus, Novolog. Her other medications are in medication list and have been reviewed. She reports no specific complaints. She does report that she has not taken her diabetic medications regularly for 2-3 weeks. Her A1C today is 14+.  Allergies  Allergen Reactions  . Tramadol Nausea And Vomiting   Past Medical History:  Diagnosis Date  . Anemia   . Diabetes mellitus   . Hypertension   . Renal disorder    Current Outpatient Prescriptions on File Prior to Visit  Medication Sig Dispense Refill  . Blood Glucose Monitoring Suppl (ACCU-CHEK AVIVA PLUS) w/Device KIT 1 Device by Does not apply route once. Use as directed to check blood sugar 1 kit 0  . Blood Glucose Monitoring Suppl (ONE TOUCH ULTRA SYSTEM KIT) w/Device KIT Test three times daily. Dx Type 2 Diabetes. 1 each 0  . gabapentin (NEURONTIN) 300 MG capsule Take 1 capsule (300 mg total) by mouth 3 (three) times daily. 90 capsule 3  . glucose blood (ACCU-CHEK AVIVA) test strip Use to check blood sugar 3 times daily and at bedtime. 100 each 12  . Insulin Pen Needle 29G X 12.7MM MISC 1 each by Does not apply route 4 (four) times daily -  before meals and at bedtime. 100 each 11  . Lancet Devices (ACCU-CHEK SOFTCLIX) lancets Use as instructed 100 each 12  . levocetirizine (XYZAL) 5 MG tablet Take 1 tablet (5 mg total) by mouth every evening. 30 tablet 11  . omeprazole (PRILOSEC) 20 MG capsule TAKE ONE CAPSULE BY MOUTH  DAILY 30 capsule  3  . polyethylene glycol (MIRALAX / GLYCOLAX) packet Take 17 g by mouth 2 (two) times daily. Take 17g PO BID until you have daily soft bowel movements, then you may decrease dose to 17g PO QD 14 each 0  . acetaminophen (TYLENOL) 500 MG tablet Take 1 tablet (500 mg total) by mouth every 4 (four) hours as needed. (Patient not taking: Reported on 01/27/2016) 21 tablet 0  . docusate sodium (COLACE) 100 MG capsule Take 1 capsule (100 mg total) by mouth 2 (two) times daily. (Patient not taking: Reported on 01/27/2016) 10 capsule 0  . ferrous sulfate 325 (65 FE) MG tablet Take 1 tablet (325 mg total) by mouth daily with breakfast. (Patient not taking: Reported on 01/27/2016)  3  . glucose blood test strip Test three times daily. One Touch Ultra test strips. Dx. Type 2 Diabetes. (Patient not taking: Reported on 10/21/2015) 100 each 12  . omeprazole (PRILOSEC) 20 MG capsule TAKE ONE CAPSULE BY MOUTH DAILY (Patient not taking: Reported on 10/21/2015) 30 capsule 0  . ONE TOUCH LANCETS MISC Test 3 times daily with glucometer. Dx Type 2 diabetes with unspecified complications. 200 each 5   No current facility-administered medications on file prior to visit.    Family History  Problem Relation Age of Onset  . Diabetes  Mother    Social History   Social History  . Marital status: Married    Spouse name: N/A  . Number of children: N/A  . Years of education: N/A   Occupational History  . Not on file.   Social History Main Topics  . Smoking status: Never Smoker  . Smokeless tobacco: Never Used  . Alcohol use No  . Drug use: No  . Sexual activity: Not on file   Other Topics Concern  . Not on file   Social History Narrative   From [place].   Married.   Three children, 9 grandchildren.   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   Travels to [place], [place]       Review of Systems: Constitutional: Negative Skin: Negative HENT: Negative  Eyes:  Negative  Neck: Negative Respiratory: Negative Cardiovascular: + for swelling for lower legs and feet Gastrointestinal: Negative Genitourinary: Negative  Musculoskeletal: + for leg cramps  Neurological: Negative for Hematological: Negative  Psychiatric/Behavioral: Negative    Objective:   Vitals:   01/27/16 1118  BP: 122/84  Pulse: 88  Resp: 12  Temp: 97.7 F (36.5 C)    Physical Exam: Constitutional: Patient appears well-developed and well-nourished. No distress. HENT: Normocephalic, atraumatic, External right and left ear normal. Oropharynx is clear and moist.  Eyes: Conjunctivae and EOM are normal. PERRLA, no scleral icterus. Neck: Normal ROM. Neck supple. No lymphadenopathy, No thyromegaly. CVS: RRR, S1/S2 +, no murmurs, no gallops, no rubs Pulmonary: Effort and breath sounds normal, no stridor, rhonchi, wheezes, rales.  Abdominal: Soft. Normoactive BS,, no distension, tenderness, rebound or guarding.  Musculoskeletal: Normal range of motion. No edema and no tenderness.  Neuro: Alert.Normal muscle tone coordination. Non-focal Skin: Skin is warm and dry. No rash noted. Not diaphoretic. No erythema. No pallor. Psychiatric: Normal mood and affect. Behavior, judgment, thought content normal.  Lab Results  Component Value Date   WBC 5.7 01/27/2016   HGB 10.5 (L) 01/27/2016   HCT 34.3 (L) 01/27/2016   MCV 87.5 01/27/2016   PLT 224 01/27/2016   Lab Results  Component Value Date   CREATININE 1.48 (H) 01/27/2016   BUN 47 (H) 01/27/2016   NA 134 (L) 01/27/2016   K 5.3 01/27/2016   CL 102 01/27/2016   CO2 26 01/27/2016    Lab Results  Component Value Date   HGBA1C 14.6 01/27/2016   Lipid Panel     Component Value Date/Time   CHOL 192 09/23/2015 0900   TRIG 66 09/23/2015 0900   HDL 93 09/23/2015 0900   CHOLHDL 2.1 09/23/2015 0900   VLDL 13 09/23/2015 0900   LDLCALC 86 09/23/2015 0900        Assessment and plan:   1. Type 2 diabetes mellitus with  complication, unspecified long term insulin use status (HCC)  - COMPLETE METABOLIC PANEL WITH GFR - CBC with Differential - POCT A1C  2. Encounter for hepatitis C screening test for low risk patient  - Hepatitis C Antibody  3. Uncontrolled type 2 diabetes mellitus with complication, unspecified long term insulin use status (HCC)  - insulin aspart (NOVOLOG) cartridge; Inject 6 Units into the skin 3 (three) times daily with meals.  Dispense: 15 mL; Refill: 3 - Insulin Glargine (LANTUS SOLOSTAR) 100 UNIT/ML Solostar Pen; Inject 20 Units into the skin daily at 10 pm.  Dispense: 15 mL; Refill: 3 - POCT A1C   Return in about 3 months (around 04/28/2016).  The patient was given clear instructions to go to ER or return to medical center if symptoms don't improve, worsen or new problems develop. The patient verbalized understanding.    Micheline Chapman FNP  01/29/2016, 8:35 AM

## 2016-02-10 IMAGING — CT CT HEAD W/O CM
2 series · 16 of 30 positions shown, 20 images · non-contrast
Comparison: 11/21/2007

CLINICAL DATA: Generalized headache of 2 days duration.

EXAM:
CT HEAD WITHOUT CONTRAST
TECHNIQUE: Contiguous axial images were obtained from the base of the skull
through the vertex without intravenous contrast.

[Series 201: head w/o, idose (1) · axial · non-contrast · 0.44mm/px · z∈[+85,+205]mm · 13 of 30 slices shown, 17 images]
[im 3/30  brain]
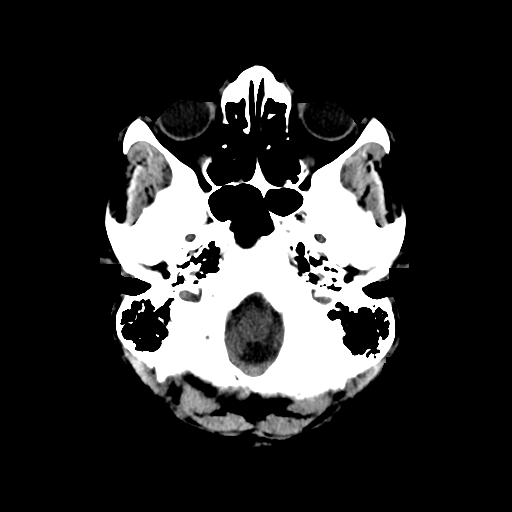
[im 3/30  bone]
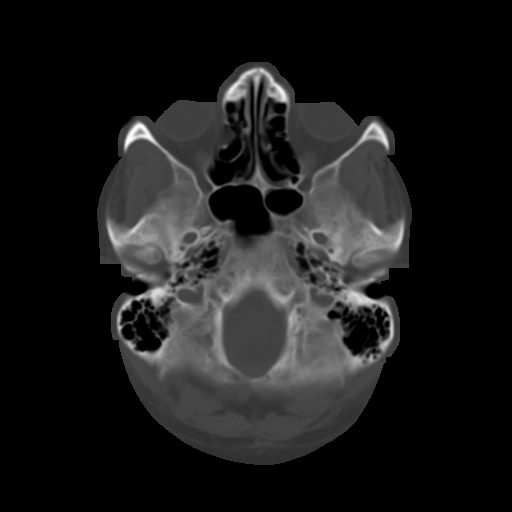
[im 5/30  brain]
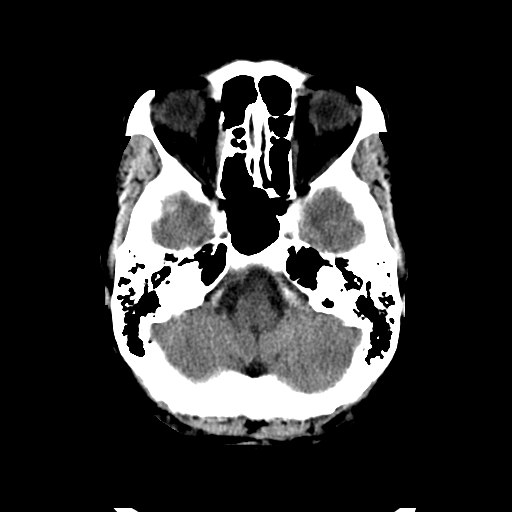
[im 7/30  brain]
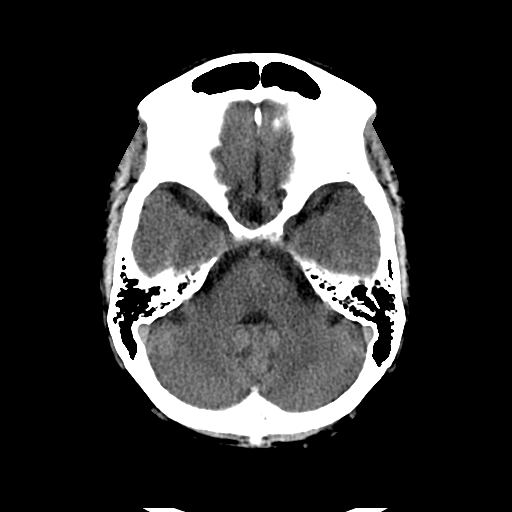
[im 9/30  brain]
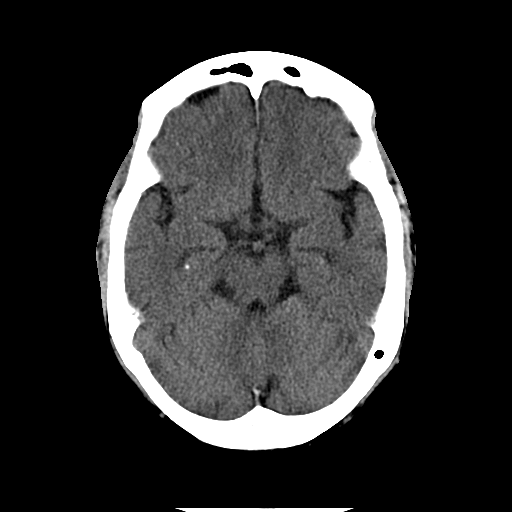
[im 11/30  brain]
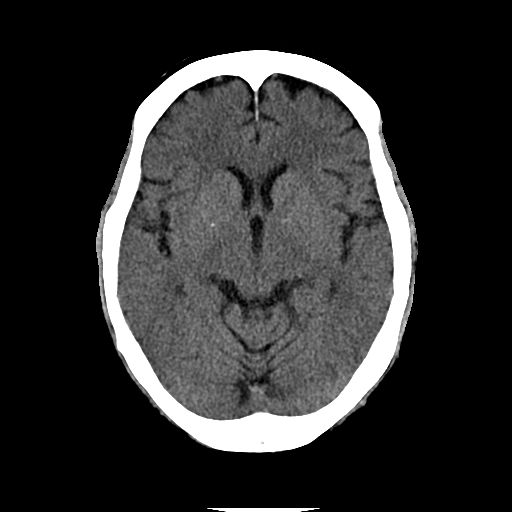
[im 11/30  bone]
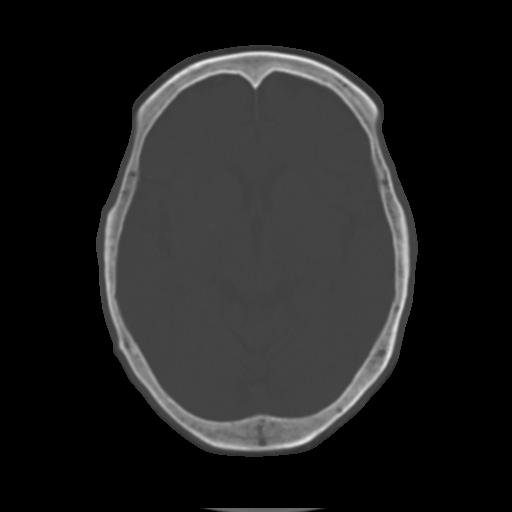
[im 13/30  brain]
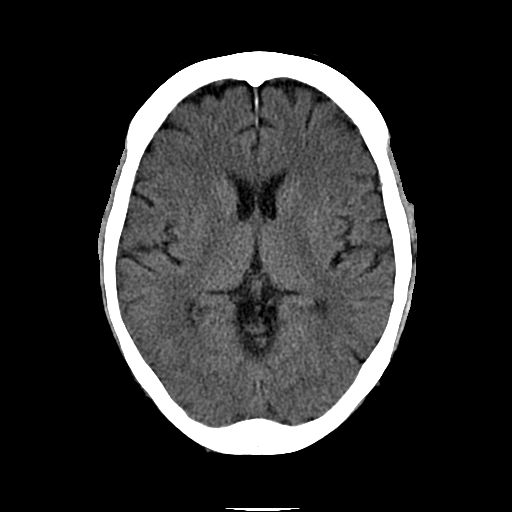
[im 15/30  brain]
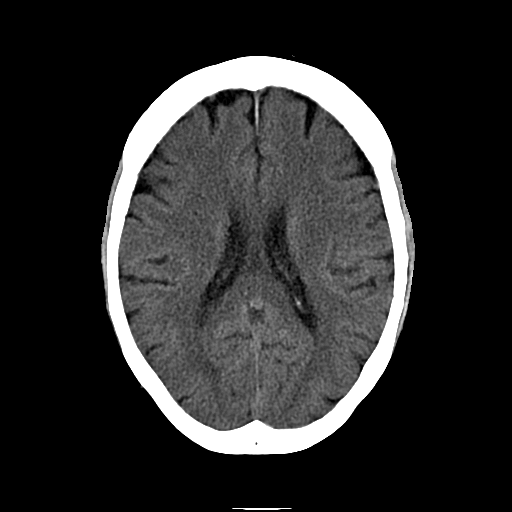
[im 17/30  brain]
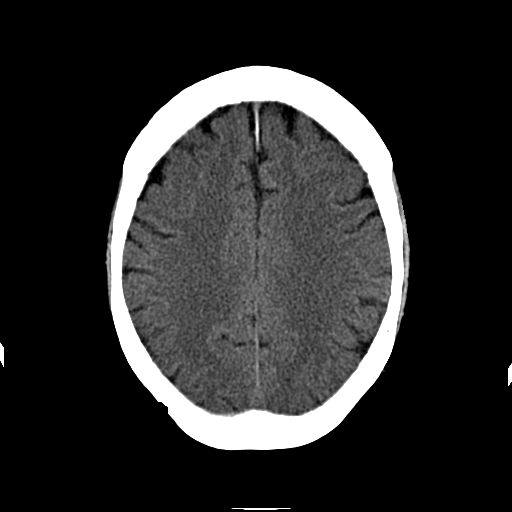
[im 19/30  brain]
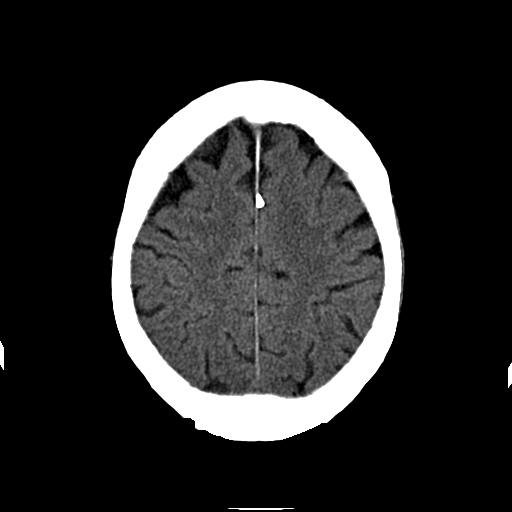
[im 19/30  bone]
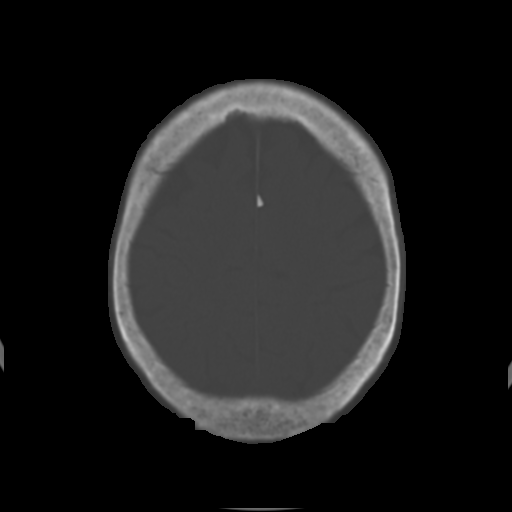
[im 21/30  brain]
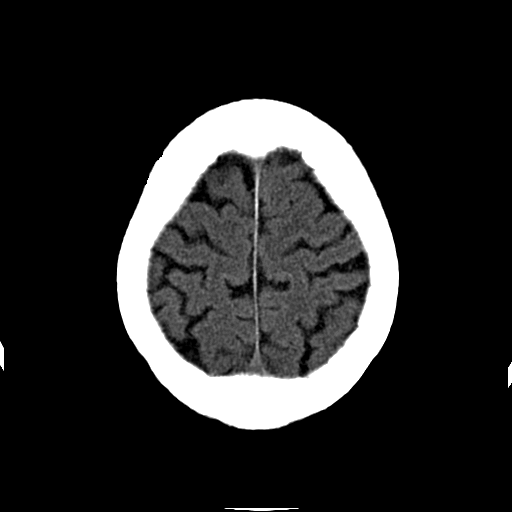
[im 23/30  brain]
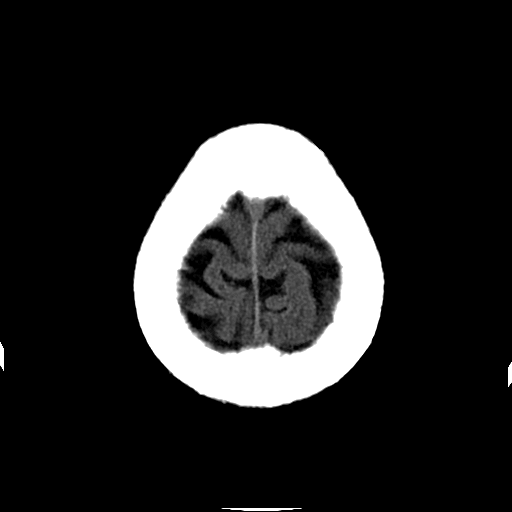
[im 25/30  brain]
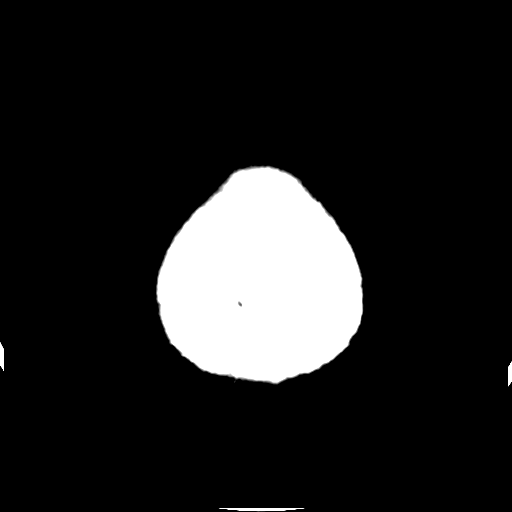
[im 27/30  brain]
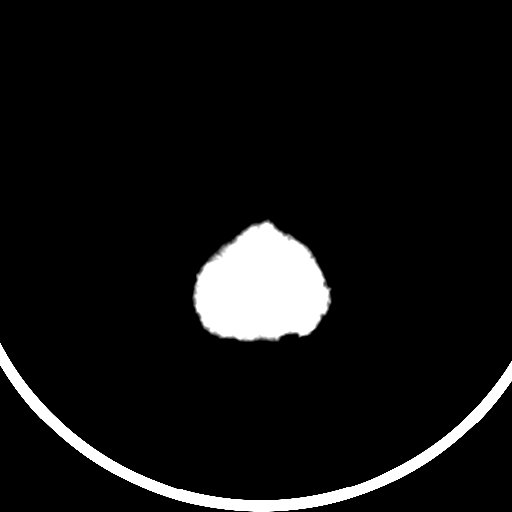
[im 27/30  bone]
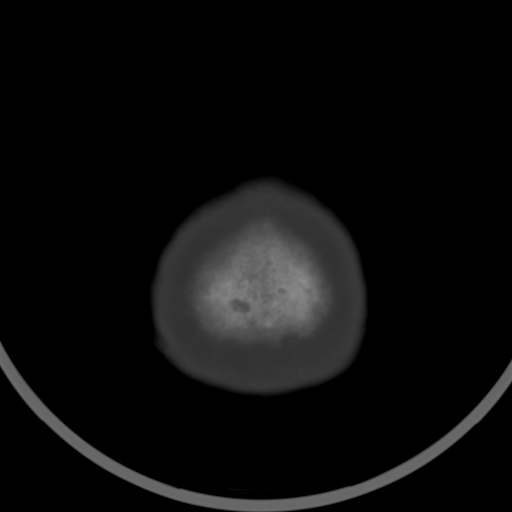

[Series 202: head w/o bone, idose (1) · axial · non-contrast · 0.44mm/px · z∈[+85,+125]mm · 3 of 30 slices shown]
[im 3/30  bone]
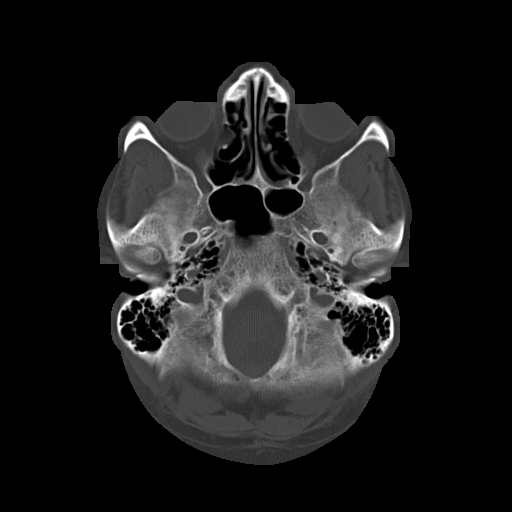
[im 7/30  bone]
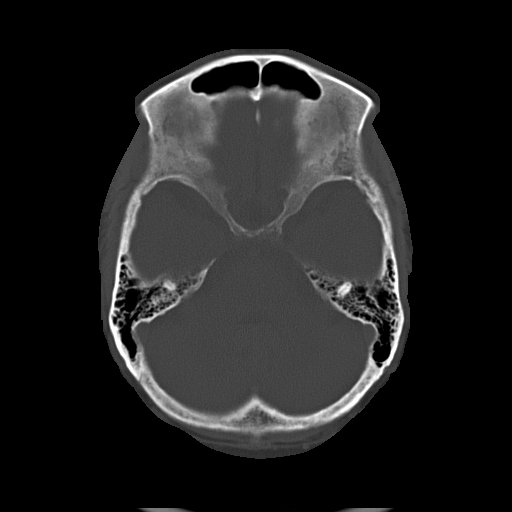
[im 11/30  bone]
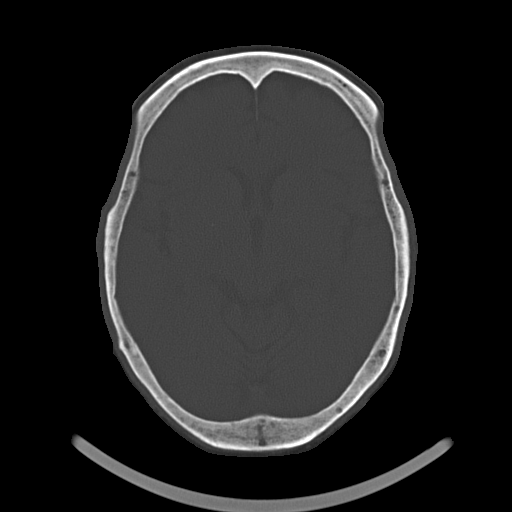

[16 of 30 positions shown; findings below may reference images not displayed]

FINDINGS: The brain has a normal appearance without evidence of atrophy,
infarction, mass lesion, hemorrhage, hydrocephalus or extra-axial
collection. The calvarium is unremarkable. The paranasal sinuses,
middle ears and mastoids are clear.
IMPRESSION: Normal head CT.  No abnormality seen to explain headache.

## 2016-02-17 ENCOUNTER — Other Ambulatory Visit: Payer: Self-pay | Admitting: Family Medicine

## 2016-02-17 DIAGNOSIS — K219 Gastro-esophageal reflux disease without esophagitis: Secondary | ICD-10-CM

## 2016-03-24 ENCOUNTER — Ambulatory Visit (HOSPITAL_COMMUNITY)
Admission: EM | Admit: 2016-03-24 | Discharge: 2016-03-24 | Disposition: A | Discharge status: Home / Self Care | Payer: Medicaid Other | Attending: Family Medicine | Admitting: Family Medicine

## 2016-03-24 ENCOUNTER — Encounter (HOSPITAL_COMMUNITY): Payer: Self-pay | Admitting: Family Medicine

## 2016-03-24 DIAGNOSIS — M1990 Unspecified osteoarthritis, unspecified site: Secondary | ICD-10-CM | POA: Diagnosis not present

## 2016-03-24 DIAGNOSIS — J00 Acute nasopharyngitis [common cold]: Secondary | ICD-10-CM | POA: Diagnosis not present

## 2016-03-24 MED ORDER — MELOXICAM 7.5 MG PO TABS: 7.5000 mg | ORAL_TABLET | Freq: Every day | ORAL | 0 refills | Status: DC

## 2016-03-24 MED ORDER — IPRATROPIUM BROMIDE 0.06 % NA SOLN: 2.0000 | Freq: Four times a day (QID) | NASAL | 0 refills | Status: DC

## 2016-03-24 MED ORDER — AZITHROMYCIN 250 MG PO TABS: 250.0000 mg | ORAL_TABLET | Freq: Every day | ORAL | 0 refills | Status: DC

## 2016-03-24 MED ORDER — IPRATROPIUM BROMIDE 0.06 % NA SOLN: 2.0000 | Freq: Four times a day (QID) | NASAL | 12 refills | Status: DC

## 2016-03-24 NOTE — ED Triage Notes (Signed)
Pt here for URI symptoms since Sunday. sts also arthritis pain.

## 2016-03-24 NOTE — ED Provider Notes (Signed)
CSN: [id number]     Arrival date & time 03/24/16  1002 History   First MD Initiated Contact with Patient 03/24/16 1024     Chief Complaint  Patient presents with  . Cough  . Nasal Congestion   (Consider location/radiation/quality/duration/timing/severity/associated sxs/prior Treatment) Patient c/o head congestion, cough, uri and arthritis in her hands.  She is diabetic and she was told by her PCP to take tylenol and she states it is  Not working. To relieve her arthritis sx's.   The history is provided by the patient.  URI  Presenting symptoms: congestion, cough and fatigue   Severity:  Moderate Onset quality:  Sudden Timing:  Constant Progression:  Worsening Chronicity:  New Relieved by:  Nothing Worsened by:  Nothing Ineffective treatments:  None tried Associated symptoms: arthralgias   Risk factors: being elderly, chronic kidney disease, diabetes mellitus and recent illness   Risk factors: no chronic cardiac disease     Past Medical History:  Diagnosis Date  . Anemia   . Diabetes mellitus   . Hypertension   . Renal disorder    Past Surgical History:  Procedure Laterality Date  . AMPUTATION Right 05/08/2014   Procedure: AMPUTATION RAY, right great toe;  Surgeon: Newt Minion, MD;  Location: [place];  Service: Orthopedics;  Laterality: Right;  . arm surgery     . CESAREAN SECTION    . I&D EXTREMITY Right 05/08/2014   Procedure: IRRIGATION AND DEBRIDEMENT FOOT;  Surgeon: Newt Minion, MD;  Location: [place];  Service: Orthopedics;  Laterality: Right;   Family History  Problem Relation Age of Onset  . Diabetes Mother    Social History  Substance Use Topics  . Smoking status: Never Smoker  . Smokeless tobacco: Never Used  . Alcohol use No   OB History    No data available     Review of Systems  Constitutional: Positive for fatigue.  HENT: Positive for congestion.   Eyes: Negative.   Respiratory: Positive for cough.   Cardiovascular: Negative.    Gastrointestinal: Negative.   Endocrine: Negative.   Genitourinary: Negative.   Musculoskeletal: Positive for arthralgias.  Skin: Negative.   Allergic/Immunologic: Negative.   Neurological: Negative.   Hematological: Negative.   Psychiatric/Behavioral: Negative.     Allergies  Tramadol  Home Medications   Prior to Admission medications   Medication Sig Start Date End Date Taking? Authorizing Provider  acetaminophen (TYLENOL) 500 MG tablet Take 1 tablet (500 mg total) by mouth every 4 (four) hours as needed. Patient not taking: Reported on 01/27/2016 11/26/14   Gloriann Loan, PA-C  azithromycin (ZITHROMAX) 250 MG tablet Take 1 tablet (250 mg total) by mouth daily. Take first 2 tablets together, then 1 every day until finished. 03/24/16   Lysbeth Penner, FNP  Blood Glucose Monitoring Suppl (ACCU-CHEK AVIVA PLUS) w/Device KIT 1 Device by Does not apply route once. Use as directed to check blood sugar 06/11/15   Dorena Dew, FNP  Blood Glucose Monitoring Suppl (ONE TOUCH ULTRA SYSTEM KIT) w/Device KIT Test three times daily. Dx Type 2 Diabetes. 06/10/15   Dorena Dew, FNP  docusate sodium (COLACE) 100 MG capsule Take 1 capsule (100 mg total) by mouth 2 (two) times daily. Patient not taking: Reported on 01/27/2016 05/16/14   Thurnell Lose, MD  ferrous sulfate 325 (65 FE) MG tablet Take 1 tablet (325 mg total) by mouth daily with breakfast. Patient not taking: Reported on 01/27/2016 05/16/14   Thurnell Lose,  MD  gabapentin (NEURONTIN) 300 MG capsule Take 1 capsule (300 mg total) by mouth 3 (three) times daily. 09/23/15   Dorena Dew, FNP  glucose blood (ACCU-CHEK AVIVA) test strip Use to check blood sugar 3 times daily and at bedtime. 06/11/15   Dorena Dew, FNP  glucose blood test strip Test three times daily. One Touch Ultra test strips. Dx. Type 2 Diabetes. Patient not taking: Reported on 10/21/2015 06/10/15   Dorena Dew, FNP  hydrochlorothiazide (HYDRODIURIL) 25 MG  tablet Take 1 tablet (25 mg total) by mouth daily. 01/27/16   Micheline Chapman, NP  insulin aspart (NOVOLOG) cartridge Inject 6 Units into the skin 3 (three) times daily with meals. 01/27/16   Micheline Chapman, NP  Insulin Glargine (LANTUS SOLOSTAR) 100 UNIT/ML Solostar Pen Inject 20 Units into the skin daily at 10 pm. 01/27/16   Micheline Chapman, NP  Insulin Pen Needle 29G X 12.7MM MISC 1 each by Does not apply route 4 (four) times daily -  before meals and at bedtime. 06/11/15   Dorena Dew, FNP  ipratropium (ATROVENT) 0.06 % nasal spray Place 2 sprays into both nostrils 4 (four) times daily. 03/24/16   Lysbeth Penner, FNP  Lancet Devices [facility]) lancets Use as instructed 06/11/15   Dorena Dew, FNP  levocetirizine (XYZAL) 5 MG tablet Take 1 tablet (5 mg total) by mouth every evening. 08/22/15   Dorena Dew, FNP  lisinopril (PRINIVIL,ZESTRIL) 10 MG tablet Take 1 tablet (10 mg total) by mouth daily. 01/27/16   Micheline Chapman, NP  meloxicam (MOBIC) 7.5 MG tablet Take 1 tablet (7.5 mg total) by mouth daily. 03/24/16   Lysbeth Penner, FNP  omeprazole (PRILOSEC) 20 MG capsule TAKE ONE CAPSULE BY MOUTH DAILY Patient not taking: Reported on 10/21/2015 04/15/15   Dorena Dew, FNP  omeprazole (PRILOSEC) 20 MG capsule TAKE ONE CAPSULE BY MOUTH ONCE DAILY 02/17/16   Micheline Chapman, NP  ONE TOUCH LANCETS MISC Test 3 times daily with glucometer. Dx Type 2 diabetes with unspecified complications. 06/10/15   Dorena Dew, FNP  polyethylene glycol (MIRALAX / GLYCOLAX) packet Take 17 g by mouth 2 (two) times daily. Take 17g PO BID until you have daily soft bowel movements, then you may decrease dose to 17g PO QD 08/31/14   [place], PA-C   Meds Ordered and Administered this Visit  Medications - No data to display  BP 187/98   Pulse 85   Temp 98.5 F (36.9 C)   Resp 18   SpO2 99%  No data found.   Physical Exam  Constitutional: She appears well-developed  and well-nourished.  HENT:  Head: Normocephalic and atraumatic.  Right Ear: External ear normal.  Left Ear: External ear normal.  Mouth/Throat: Oropharynx is clear and moist.  Eyes: Conjunctivae and EOM are normal. Pupils are equal, round, and reactive to light.  Neck: Normal range of motion. Neck supple.  Cardiovascular: Normal rate, regular rhythm and normal heart sounds.   Pulmonary/Chest: Effort normal and breath sounds normal.  Musculoskeletal:  Tenderness to Bilateral hands and DIP joints on fingers with synovial thickening.  Nursing note and vitals reviewed.   Urgent Care Course   Clinical Course     Procedures (including critical care time)  Labs Review Labs Reviewed - No data to display  Imaging Review No results found.   Visual Acuity Review  Right Eye Distance:   Left Eye Distance:  Bilateral Distance:    Right Eye Near:   Left Eye Near:    Bilateral Near:         MDM   1. Acute nasopharyngitis   2. Arthritis    Zpak Mobic 7.91m one po qd #14 (Advised patient to talk with PCP about arthritis) Take tylenol otc as directed. Atrovent Nasal Spray 0.06% 2 sprays per nostril qid prn      WLysbeth Penner FNP 03/24/16 1100

## 2016-04-06 IMAGING — CR DG CHEST 2V
2 series · 2 of 2 positions shown · non-contrast
Comparison: 02/26/2011

CLINICAL DATA: Stepped on a tack 2 days ago. Pain in the plantar
surface of the right foot. Mid sternal chest pain and shortness of
breath started today i[REDACTED].

EXAM:
CHEST  2 VIEW

[w chest pa]
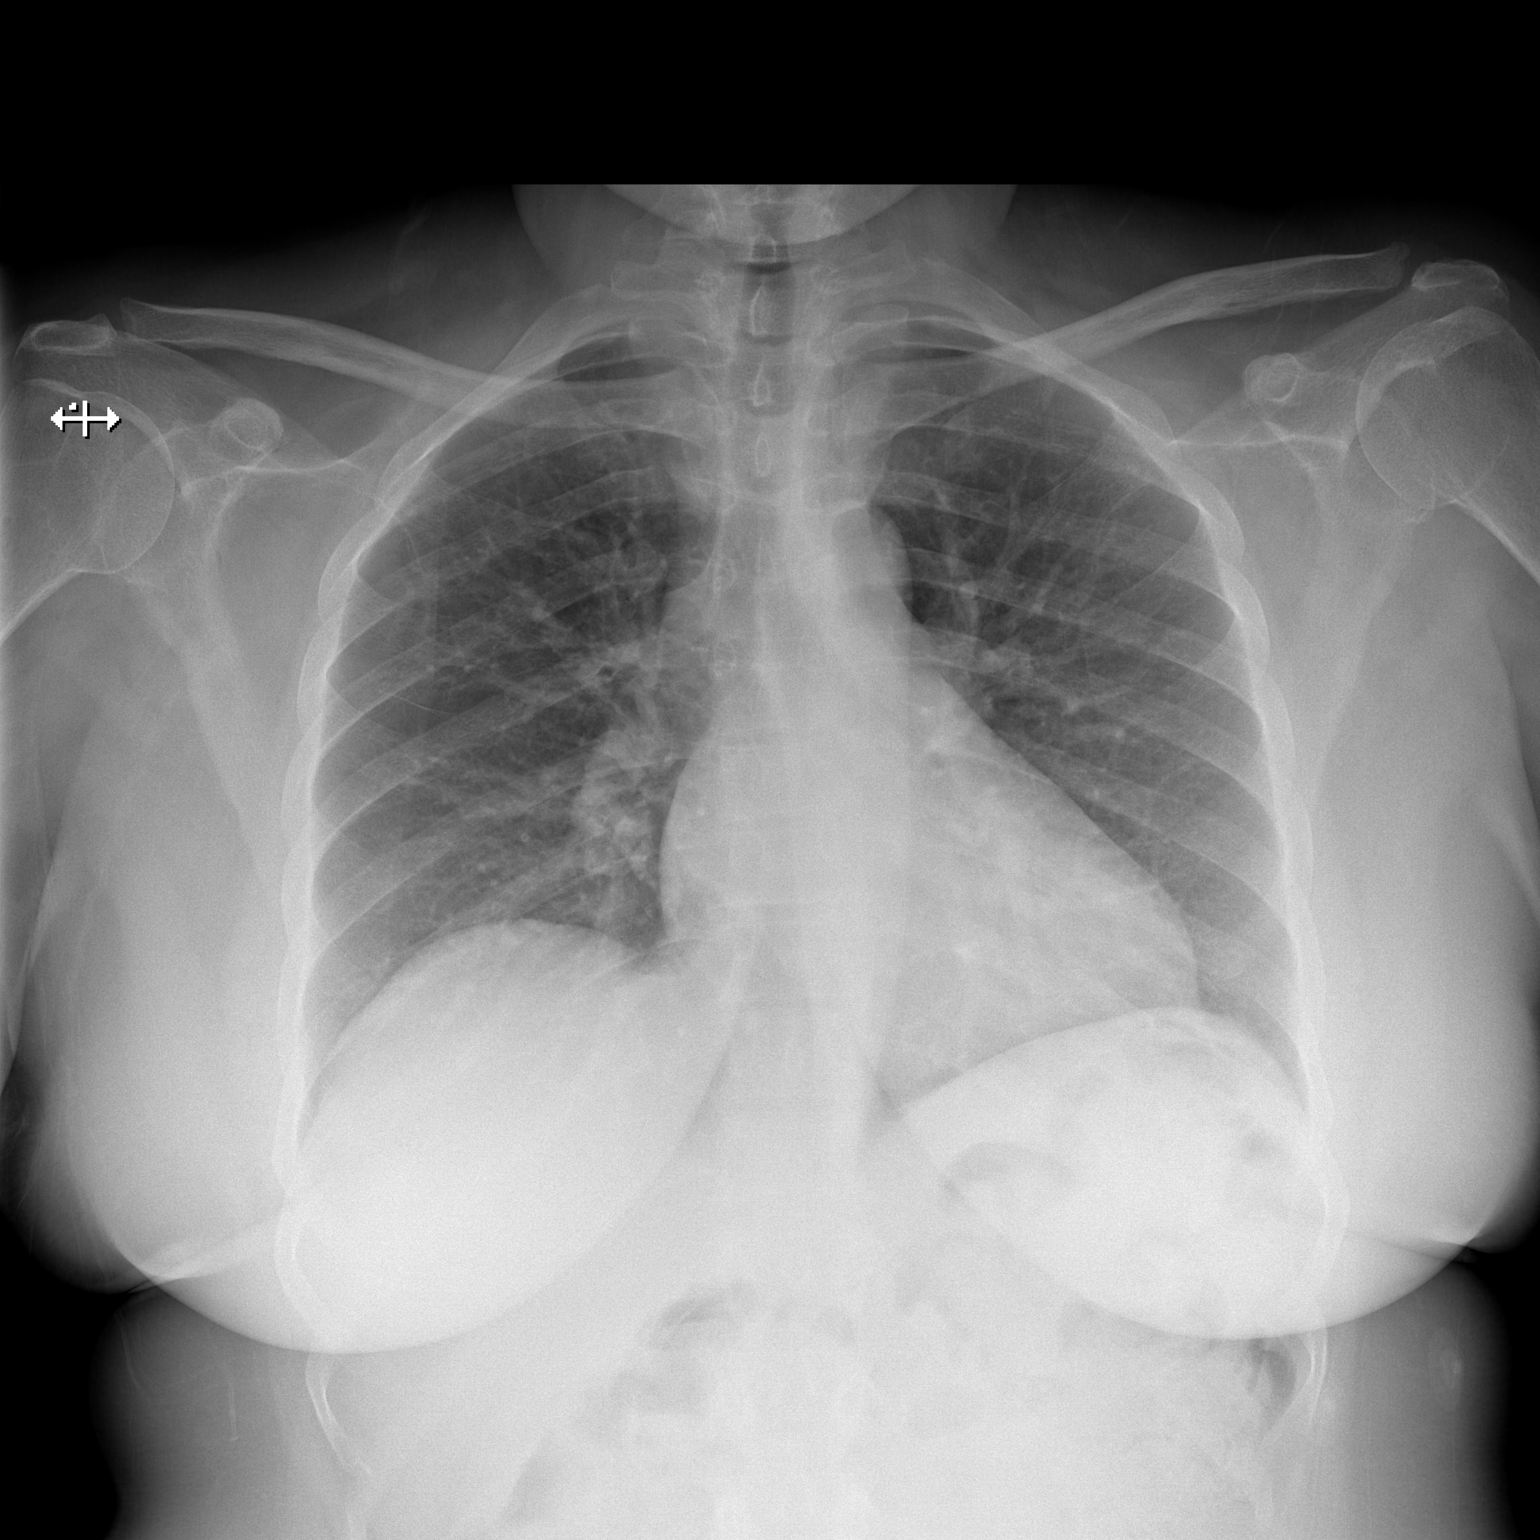

[w chest lat]
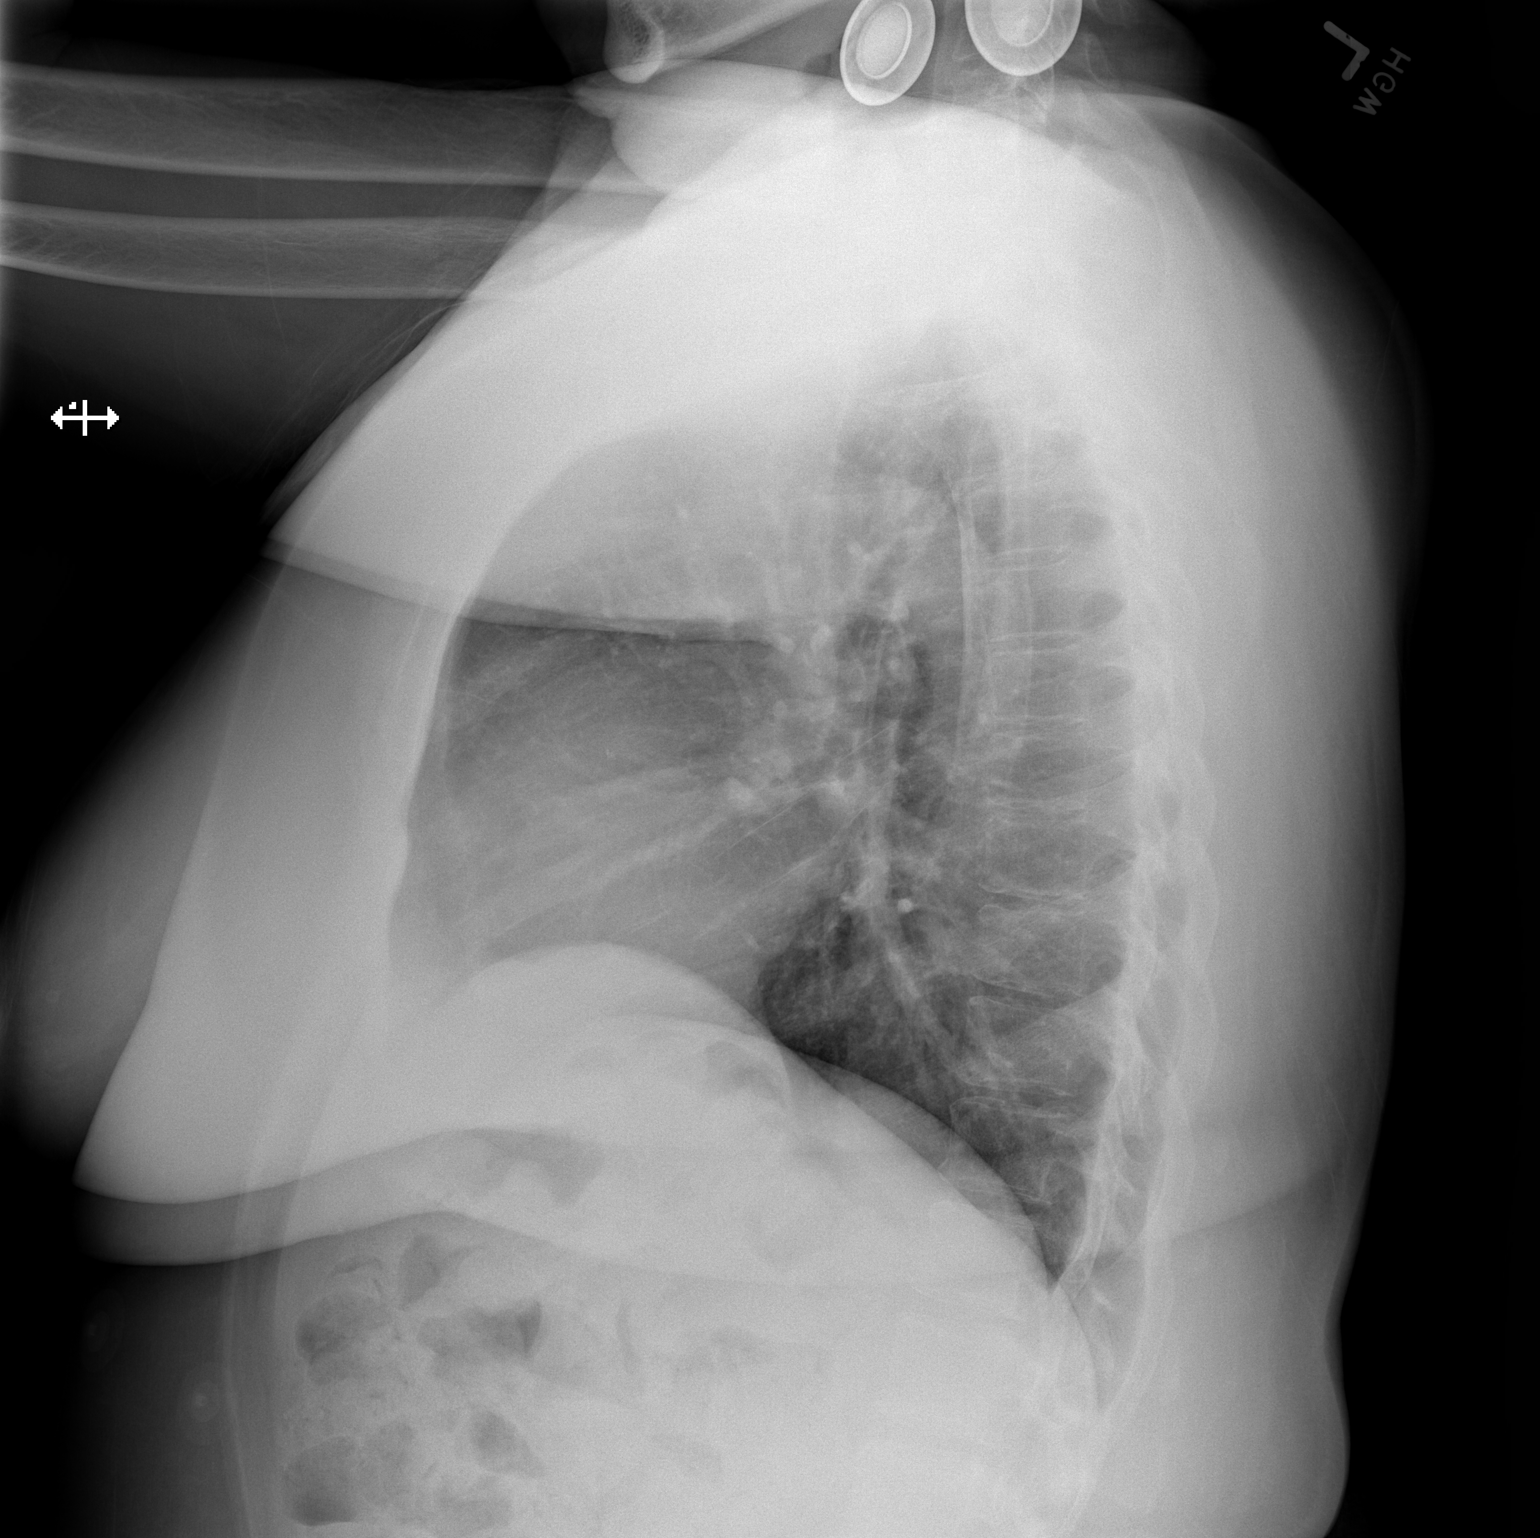

[2 of 2 positions shown; findings below may reference images not displayed]

FINDINGS: The heart size and mediastinal contours are within normal limits.
Both lungs are clear. The visualized skeletal structures are
unremarkable.
IMPRESSION: No active cardiopulmonary disease.

## 2016-04-09 IMAGING — CR DG ABD PORTABLE 1V
2 series · 2 of 2 positions shown · non-contrast
Comparison: None.

CLINICAL DATA: Initial encounter for one week history of
constipation and loss of appetite.

EXAM:
PORTABLE ABDOMEN - 1 VIEW

[AP (1 of 2)]
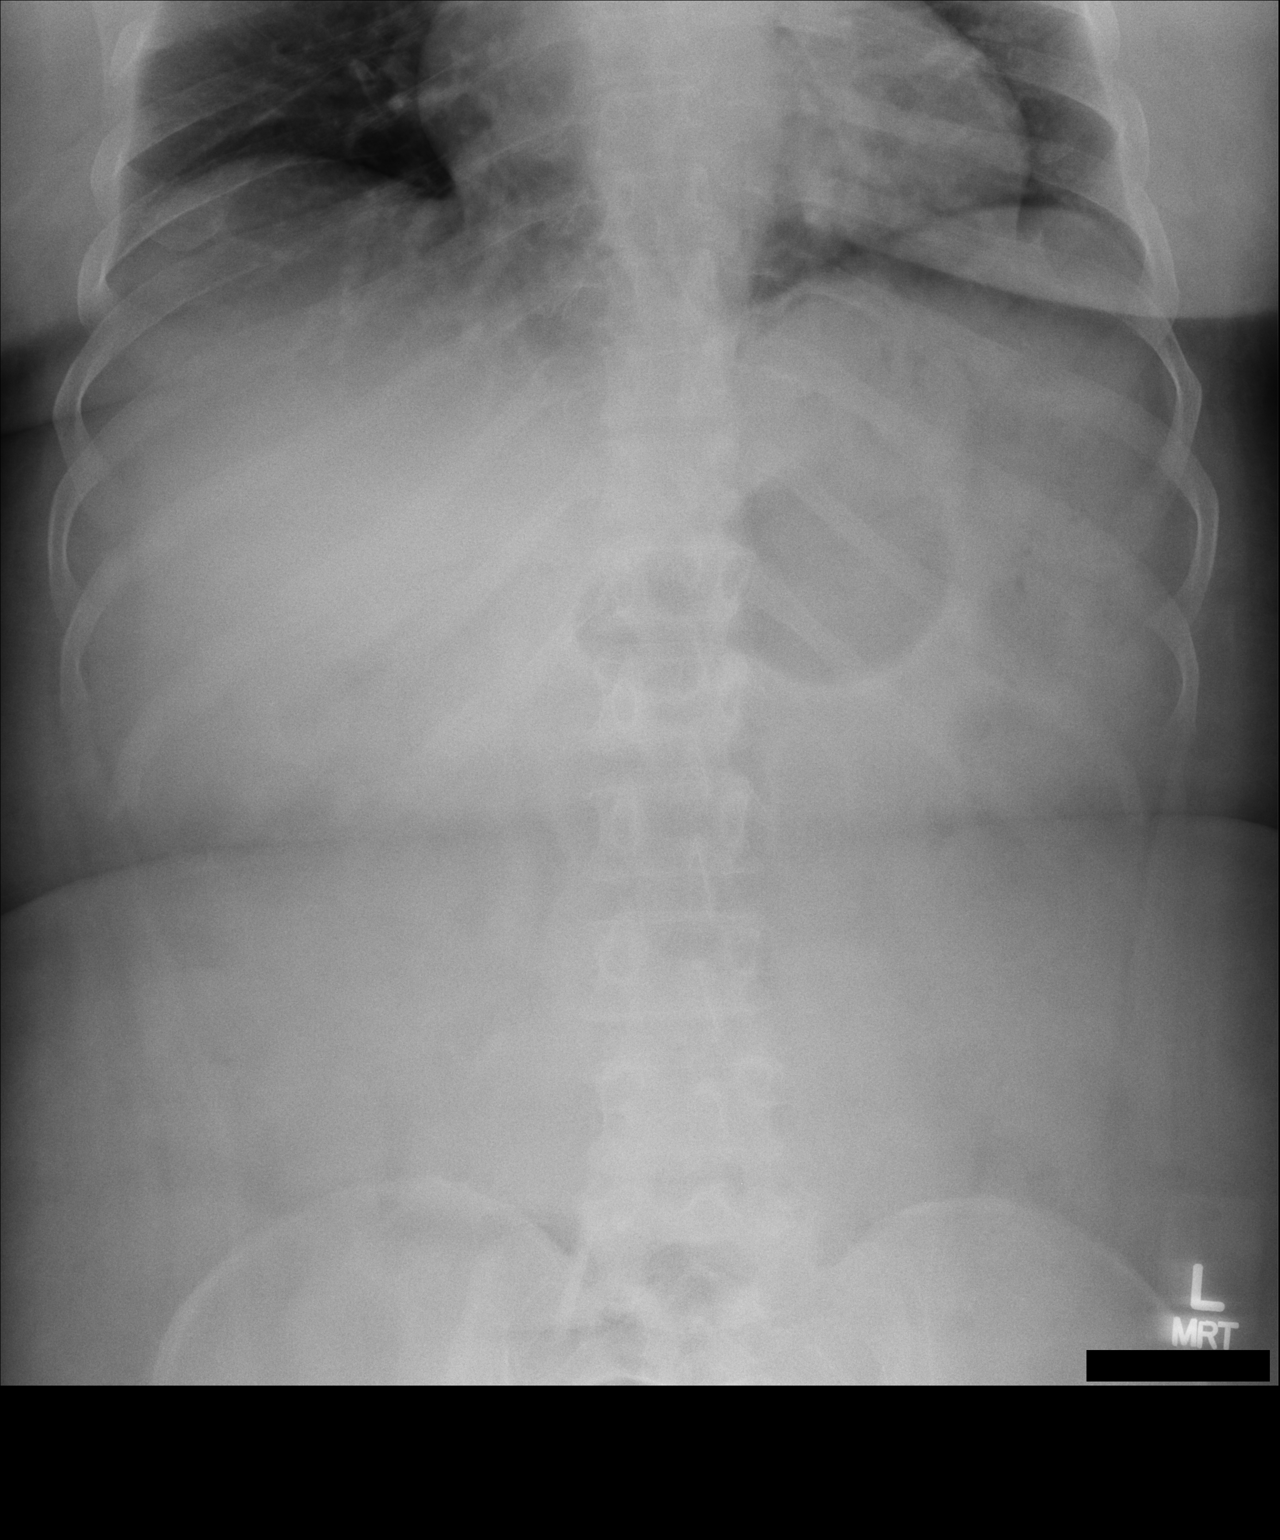

[AP (2 of 2)]
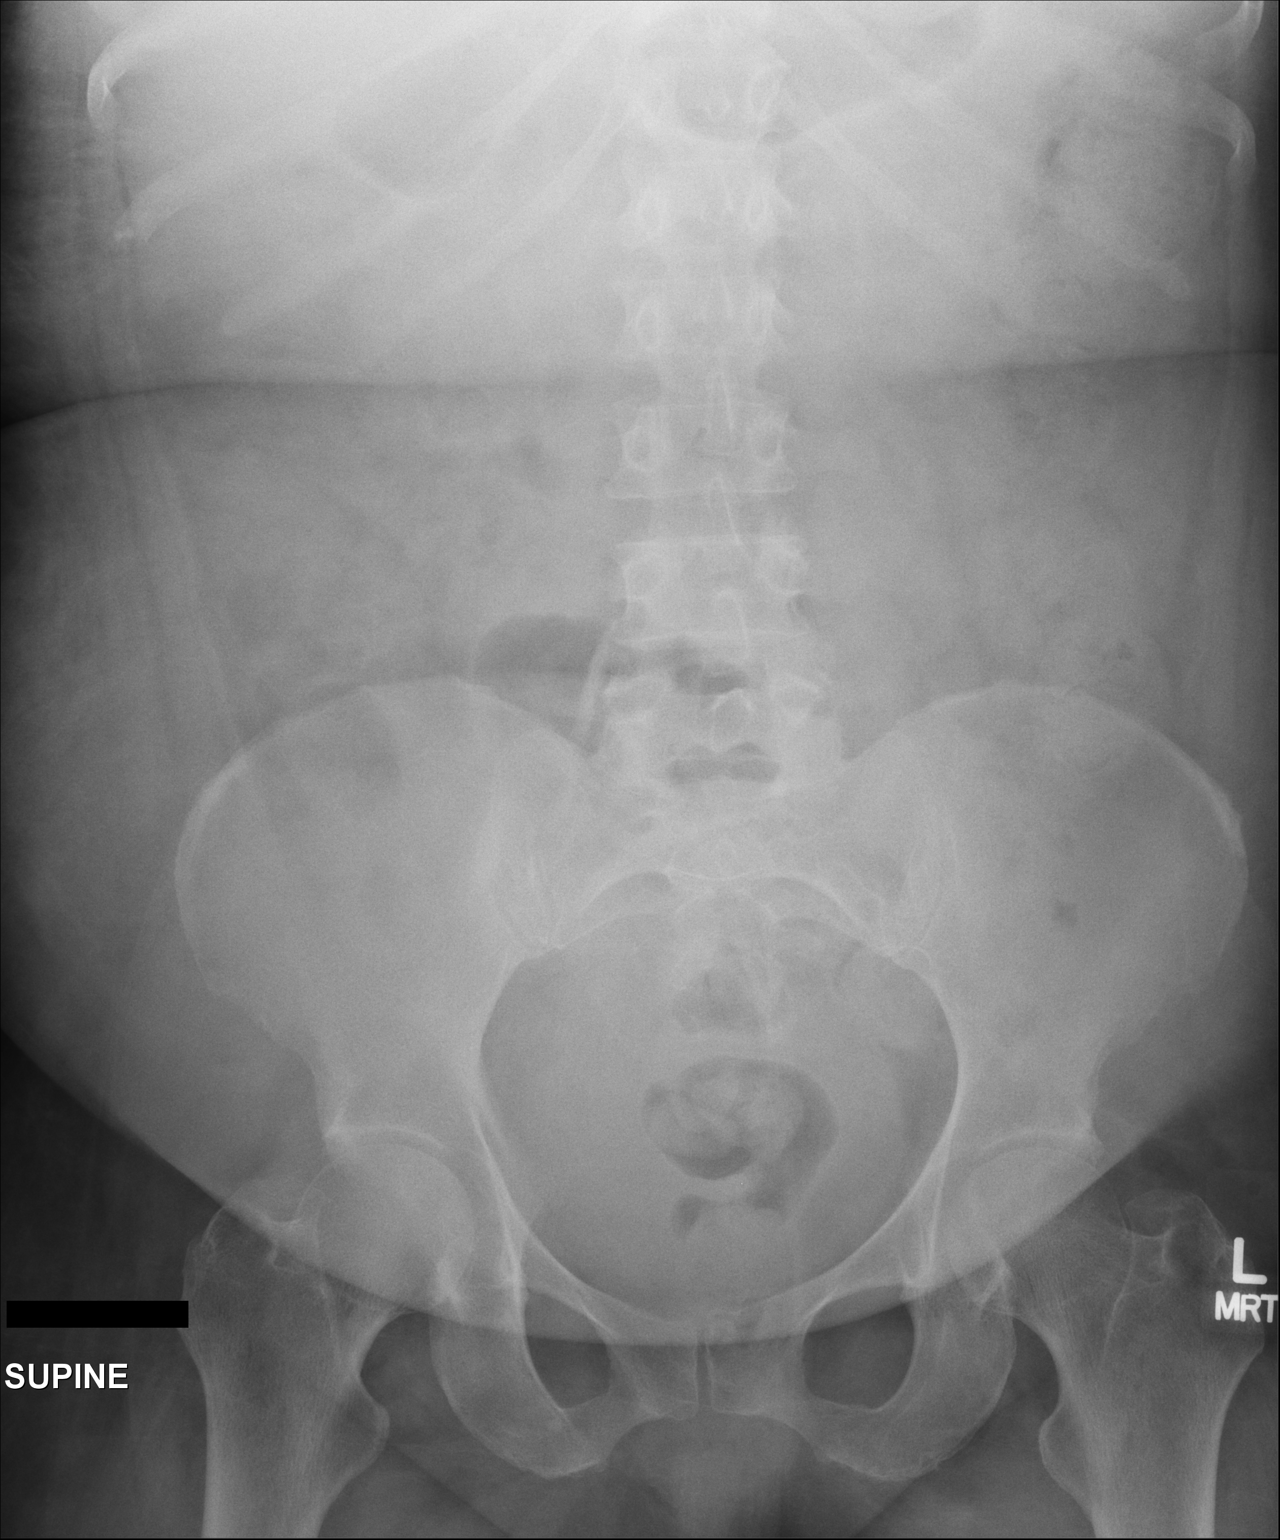

[2 of 2 positions shown; findings below may reference images not displayed]

FINDINGS: There is a paucity of bowel gas. No radiographic evidence to suggest
obstruction. No unexpected abdominal pelvic calcification. The
visualized bony anatomy is unremarkable.
IMPRESSION: No evidence for bowel obstruction.

## 2016-04-28 ENCOUNTER — Ambulatory Visit (INDEPENDENT_AMBULATORY_CARE_PROVIDER_SITE_OTHER): Payer: Medicaid Other | Admitting: Family Medicine

## 2016-04-28 ENCOUNTER — Encounter: Payer: Self-pay | Admitting: Family Medicine

## 2016-04-28 VITALS — BP 153/73 | HR 78 | Temp 98.3°F | Resp 18 | Ht 62.0 in | Wt 212.4 lb

## 2016-04-28 DIAGNOSIS — M79601 Pain in right arm: Secondary | ICD-10-CM

## 2016-04-28 DIAGNOSIS — R600 Localized edema: Secondary | ICD-10-CM

## 2016-04-28 DIAGNOSIS — Z1211 Encounter for screening for malignant neoplasm of colon: Secondary | ICD-10-CM | POA: Insufficient documentation

## 2016-04-28 DIAGNOSIS — R29898 Other symptoms and signs involving the musculoskeletal system: Secondary | ICD-10-CM | POA: Diagnosis not present

## 2016-04-28 DIAGNOSIS — Z794 Long term (current) use of insulin: Secondary | ICD-10-CM | POA: Diagnosis not present

## 2016-04-28 DIAGNOSIS — E118 Type 2 diabetes mellitus with unspecified complications: Secondary | ICD-10-CM | POA: Diagnosis not present

## 2016-04-28 DIAGNOSIS — R296 Repeated falls: Secondary | ICD-10-CM | POA: Diagnosis not present

## 2016-04-28 LAB — POCT URINALYSIS DIP (DEVICE)
BILIRUBIN URINE: NEGATIVE
Glucose, UA: 250 mg/dL — AB
Ketones, ur: NEGATIVE mg/dL
Nitrite: NEGATIVE
Protein, ur: 30 mg/dL — AB
SPECIFIC GRAVITY, URINE: 1.015 (ref 1.005–1.030)
Urobilinogen, UA: 0.2 mg/dL (ref 0.0–1.0)
pH: 5.5 (ref 5.0–8.0)

## 2016-04-28 LAB — CBC WITH DIFFERENTIAL/PLATELET
BASOS ABS: 53 {cells}/uL (ref 0–200)
Basophils Relative: 1 %
EOS ABS: 106 {cells}/uL (ref 15–500)
Eosinophils Relative: 2 %
HCT: 33 % — ABNORMAL LOW (ref 35.0–45.0)
HEMOGLOBIN: 9.9 g/dL — AB (ref 11.7–15.5)
LYMPHS ABS: 1643 {cells}/uL (ref 850–3900)
Lymphocytes Relative: 31 %
MCH: 25.8 pg — AB (ref 27.0–33.0)
MCHC: 30 g/dL — ABNORMAL LOW (ref 32.0–36.0)
MCV: 86.2 fL (ref 80.0–100.0)
MPV: 11.1 fL (ref 7.5–12.5)
Monocytes Absolute: 318 cells/uL (ref 200–950)
Monocytes Relative: 6 %
NEUTROS ABS: 3180 {cells}/uL (ref 1500–7800)
Neutrophils Relative %: 60 %
Platelets: 238 10*3/uL (ref 140–400)
RBC: 3.83 MIL/uL (ref 3.80–5.10)
RDW: 13.8 % (ref 11.0–15.0)
WBC: 5.3 10*3/uL (ref 3.8–10.8)

## 2016-04-28 LAB — POCT GLYCOSYLATED HEMOGLOBIN (HGB A1C): Hemoglobin A1C: 11.2

## 2016-04-28 LAB — GLUCOSE, CAPILLARY: Glucose-Capillary: 250 mg/dL — ABNORMAL HIGH (ref 65–99)

## 2016-04-28 NOTE — Progress Notes (Signed)
Subjective:    Patient ID: Rachel Gonzalez, female    DOB: 1950-09-04, 66 y.o.   MRN: [id number]  HPI  [name], a 66 year old female with a history of hypertension and diabetes mellitus type 2 presents for a follow up of diabetes mellitus.  She is not exercising and is not adherent to low salt diet. She does not check blood pressure at home. She endorses lower extremity edema for greater than 1 week.    Patient denies chest pain, chest pressure/discomfort, fatigue, irregular heart beat, near-syncope, palpitations and tachypnea.  Cardiovascular risk factors include: diabetes mellitus and sedentary lifestyle.    Patient also has a history of uncontrolled type 2 diabetes mellitus. She states that she has been taking medication  consistently. 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  She states that she has been using Lantus 20 units HS and sliding scale consistently. She did not bring glucometer to appointment.  Patient denies foot ulcerations, increase appetite, nausea, paresthesia of the feet, polydipsia, polyuria, visual disturbances, vomitting and weight loss.    Past Medical History:  Diagnosis Date  . Anemia   . Diabetes mellitus   . Hypertension   . Renal disorder    Immunization History  Administered Date(s) Administered  . Pneumococcal Polysaccharide-23 02/12/2015  . Td 05/07/2014  . Tdap 11/21/2007   Allergies  Allergen Reactions  . Tramadol Nausea And Vomiting   Social History   Social History  . Marital status: Married    Spouse name: N/A  . Number of children: N/A  . Years of education: N/A   Occupational History  . Not on file.   Social History Main Topics  . Smoking status: Never Smoker  . Smokeless tobacco: Never Used  . Alcohol use No  . Drug use: No  . Sexual activity: Not on file   Other Topics Concern  . Not on file   Social History Narrative   From [place].   Married.   Three children, 9 grandchildren.   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   [place] [address] [place]      Review of Systems  Constitutional: Positive for fatigue and unexpected weight change. Negative for fever.  HENT: Negative.   Respiratory: Negative.   Cardiovascular: Negative.  Negative for chest pain, palpitations and leg swelling.  Gastrointestinal: Negative.   Endocrine: Negative for polydipsia, polyphagia and polyuria.  Genitourinary: Negative.  Negative for hematuria.  Musculoskeletal: Negative.  Myalgias: right foot pain/ ambulates with cane.       S/P right great toe amputation  Skin: Negative.   Allergic/Immunologic: Positive for environmental allergies.  Neurological: Negative.  Negative for dizziness, facial asymmetry, weakness, light-headedness and numbness.  Hematological: Negative.   Psychiatric/Behavioral: Negative.        Objective:   Physical Exam  Constitutional: She appears well-developed and well-nourished. She is active. She does not have a sickly appearance.  HENT:  Head: Normocephalic and atraumatic.  Nose: Mucosal edema present.  Eyes: Lids are normal.  Neck: Normal range of motion and full passive range of motion without pain. Neck supple.  Cardiovascular: Normal rate, regular rhythm, S1 normal, normal heart sounds and normal pulses.   Bilateral lower extremity edema (2 +)  Pulmonary/Chest: Effort normal and breath sounds normal.  Abdominal: Soft. Normal appearance and bowel sounds are normal. There is no tenderness.  Musculoskeletal:       Right ankle:  She exhibits decreased range of motion. She exhibits no swelling and normal pulse.  Right great toe amputation  Lymphadenopathy:       Head (right side): No submental and no submandibular adenopathy present.       Head (left side): No submental and no submandibular adenopathy present.  Neurological: She is alert.  Skin: Skin is warm, dry and intact.  Hyperpigmented scar to left anterior  neck  Psychiatric: Her speech is normal and behavior is normal. Judgment and thought content normal. Cognition and memory are normal.      BP (!) 153/73 (BP Location: Right Arm, Patient Position: Sitting, Cuff Size: Large)   Pulse 78   Temp 98.3 F (36.8 C)   Resp 18   Ht 5\' 2"  (1.575 m)   Wt 212 lb 6.4 oz (96.3 kg)   SpO2 100%   BMI 38.85 kg/m  Assessment & Plan:  1. Controlled type 2 diabetes mellitus with complication, with long-term current use of insulin (HCC) Patient has been taking medications incorrectly. She had been taking 6 units of lantus 3 times per day and 20 units of Novolog HS. I explained to patient that this is incorrect and she needs to pay close attention to the names of her medications. We labeled the medications along with times and I wrote her out instructions in large letters to place on refrigerator.  - HgB A1c  2. Frequent falls Will evaluate labs. Patient may warrant referral to neurology for further workup and evaluation.   3. Right arm weakness - Sedimentation Rate - C-reactive protein - ANA - CBC with Differential  4. Right arm pain Refer to #4  5. Bilateral lower extremity edema - Brain natriuretic peptide  6. Screening for colon cancer - Ambulatory referral to Gastroenterology   RTC: 3 months for DMII and hypertension  Rachel Gonzalez M, FNP    The patient was given clear instructions to go to ER or return to medical center if symptoms do not improve, worsen or new problems develop. The patient verbalized understanding. Will notify patient with laboratory results.

## 2016-04-29 LAB — C-REACTIVE PROTEIN: CRP: 15.5 mg/L — ABNORMAL HIGH (ref <8.0)

## 2016-04-29 LAB — SEDIMENTATION RATE: Sed Rate: 49 mm/hr — ABNORMAL HIGH (ref 0–30)

## 2016-04-29 LAB — BRAIN NATRIURETIC PEPTIDE: Brain Natriuretic Peptide: 45.7 pg/mL (ref <100)

## 2016-04-29 LAB — ANA: Anti Nuclear Antibody(ANA): NEGATIVE

## 2016-05-13 ENCOUNTER — Telehealth: Payer: Self-pay

## 2016-05-13 NOTE — Telephone Encounter (Signed)
Rachel Gonzalez, Is there anything to advise patient on regarding last lab work? Please advise. Thanks!

## 2016-05-15 ENCOUNTER — Other Ambulatory Visit: Payer: Self-pay | Admitting: Family Medicine

## 2016-05-15 DIAGNOSIS — E1165 Type 2 diabetes mellitus with hyperglycemia: Secondary | ICD-10-CM

## 2016-05-15 DIAGNOSIS — E118 Type 2 diabetes mellitus with unspecified complications: Principal | ICD-10-CM

## 2016-05-26 ENCOUNTER — Encounter: Payer: Self-pay | Admitting: Family Medicine

## 2016-06-06 ENCOUNTER — Ambulatory Visit (HOSPITAL_COMMUNITY)
Admission: EM | Admit: 2016-06-06 | Discharge: 2016-06-06 | Disposition: A | Discharge status: Home / Self Care | Payer: Medicaid Other | Attending: Internal Medicine | Admitting: Internal Medicine

## 2016-06-06 ENCOUNTER — Encounter (HOSPITAL_COMMUNITY): Payer: Self-pay | Admitting: *Deleted

## 2016-06-06 DIAGNOSIS — G8929 Other chronic pain: Secondary | ICD-10-CM | POA: Diagnosis not present

## 2016-06-06 DIAGNOSIS — M79601 Pain in right arm: Secondary | ICD-10-CM | POA: Diagnosis not present

## 2016-06-06 DIAGNOSIS — M79602 Pain in left arm: Secondary | ICD-10-CM

## 2016-06-06 NOTE — Discharge Instructions (Signed)
Your pain today appears to be chronic. Unfortunately, there isn't much we can offer here as an urgent care. I don't xray would be beneficial today. I would suggest you to start taking ibuprofen over-the-counter. You may take ibuprofen up to 800 mg 3 times a day for this pain. I also suggest to follow-up with your primary care doctor next week for further evaluation.

## 2016-06-06 NOTE — ED Triage Notes (Signed)
Pt  Has  Been told  She  Has  Arthritis    She  Reports  Pain in  Both   Arms   X  6  Months  Worse  Last   Pm  r  Arm is  Worse  Than the  Left   denys  Any injury

## 2016-06-06 NOTE — ED Provider Notes (Signed)
CSN: [id number]     Arrival date & time 06/06/16  1210 History   First MD Initiated Contact with Patient 06/06/16 1332     Chief Complaint  Patient presents with  . Extremity Pain   (Consider location/radiation/quality/duration/timing/severity/associated sxs/prior Treatment) Patient is a 66 y.o. Female, presents today for chronic bilateral arm pain from shoulders down to the fingers of 6 month's duration but is getting worst within the last month. She reports her right side is worst than her left extremity. Her PCP is aware of this condition. Patient saw her PCP a month ago for the same complaint, was told that she has arthritis. Patient has been taking over-the-counter Tylenol 500 mg for this pain reports that the tylenol is making her pain worse. Patient has not tried ibuprofen. Patient states her pain is 8/10. Patient also endorses occasional numbness and tingling sensation. Patient reports weakness due to the pain. Patient is having more difficulty with her ADLs.       Past Medical History:  Diagnosis Date  . Anemia   . Diabetes mellitus   . Hypertension   . Renal disorder    Past Surgical History:  Procedure Laterality Date  . AMPUTATION Right 05/08/2014   Procedure: AMPUTATION RAY, right great toe;  Surgeon: Newt Minion, MD;  Location: [place];  Service: Orthopedics;  Laterality: Right;  . arm surgery     . CESAREAN SECTION    . I&D EXTREMITY Right 05/08/2014   Procedure: IRRIGATION AND DEBRIDEMENT FOOT;  Surgeon: Newt Minion, MD;  Location: [place];  Service: Orthopedics;  Laterality: Right;   Family History  Problem Relation Age of Onset  . Diabetes Mother    Social History  Substance Use Topics  . Smoking status: Never Smoker  . Smokeless tobacco: Never Used  . Alcohol use No   OB History    No data available     Review of Systems  Constitutional:       As stated in the history of present illness    Allergies  Tramadol  Home Medications   Prior to Admission  medications   Medication Sig Start Date End Date Taking? Authorizing Provider  acetaminophen (TYLENOL) 500 MG tablet Take 1 tablet (500 mg total) by mouth every 4 (four) hours as needed. Patient not taking: Reported on 01/27/2016 11/26/14   Gloriann Loan, PA-C  azithromycin (ZITHROMAX) 250 MG tablet Take 1 tablet (250 mg total) by mouth daily. Take first 2 tablets together, then 1 every day until finished. Patient not taking: Reported on 04/28/2016 03/24/16   Lysbeth Penner, FNP  Blood Glucose Monitoring Suppl (ACCU-CHEK AVIVA PLUS) w/Device KIT 1 Device by Does not apply route once. Use as directed to check blood sugar 06/11/15   Dorena Dew, FNP  Blood Glucose Monitoring Suppl (ONE TOUCH ULTRA SYSTEM KIT) w/Device KIT Test three times daily. Dx Type 2 Diabetes. 06/10/15   Dorena Dew, FNP  docusate sodium (COLACE) 100 MG capsule Take 1 capsule (100 mg total) by mouth 2 (two) times daily. Patient not taking: Reported on 01/27/2016 05/16/14   Thurnell Lose, MD  ferrous sulfate 325 (65 FE) MG tablet Take 1 tablet (325 mg total) by mouth daily with breakfast. Patient not taking: Reported on 01/27/2016 05/16/14   Thurnell Lose, MD  gabapentin (NEURONTIN) 300 MG capsule Take 1 capsule (300 mg total) by mouth 3 (three) times daily. 09/23/15   Dorena Dew, FNP  glucose blood (ACCU-CHEK AVIVA) test strip  Use to check blood sugar 3 times daily and at bedtime. 06/11/15   Dorena Dew, FNP  glucose blood test strip Test three times daily. One Touch Ultra test strips. Dx. Type 2 Diabetes. Patient not taking: Reported on 10/21/2015 06/10/15   Dorena Dew, FNP  hydrochlorothiazide (HYDRODIURIL) 25 MG tablet Take 1 tablet (25 mg total) by mouth daily. 01/27/16   Micheline Chapman, NP  insulin aspart (NOVOLOG) cartridge Inject 6 Units into the skin 3 (three) times daily with meals. 01/27/16   Micheline Chapman, NP  Insulin Glargine (LANTUS SOLOSTAR) 100 UNIT/ML Solostar Pen Inject 20 Units into the  skin daily at 10 pm. 01/27/16   Micheline Chapman, NP  Insulin Pen Needle 29G X 12.7MM MISC 1 each by Does not apply route 4 (four) times daily -  before meals and at bedtime. 06/11/15   Dorena Dew, FNP  ipratropium (ATROVENT) 0.06 % nasal spray Place 2 sprays into both nostrils 4 (four) times daily. 03/24/16   Lysbeth Penner, FNP  Lancet Devices [facility]) lancets Use as instructed 06/11/15   Dorena Dew, FNP  levocetirizine (XYZAL) 5 MG tablet Take 1 tablet (5 mg total) by mouth every evening. 08/22/15   Dorena Dew, FNP  lisinopril (PRINIVIL,ZESTRIL) 10 MG tablet Take 1 tablet (10 mg total) by mouth daily. 01/27/16   Micheline Chapman, NP  meloxicam (MOBIC) 7.5 MG tablet Take 1 tablet (7.5 mg total) by mouth daily. 03/24/16   Lysbeth Penner, FNP  NOVOLOG FLEXPEN 100 UNIT/ML FlexPen INJECT 6 UNITS INTO THE SKIN THREE TIMES DAILY WITH MEALS 05/18/16   Dorena Dew, FNP  omeprazole (PRILOSEC) 20 MG capsule TAKE ONE CAPSULE BY MOUTH DAILY Patient not taking: Reported on 10/21/2015 04/15/15   Dorena Dew, FNP  omeprazole (PRILOSEC) 20 MG capsule TAKE ONE CAPSULE BY MOUTH ONCE DAILY 02/17/16   Micheline Chapman, NP  ONE TOUCH LANCETS MISC Test 3 times daily with glucometer. Dx Type 2 diabetes with unspecified complications. 06/10/15   Dorena Dew, FNP  polyethylene glycol (MIRALAX / GLYCOLAX) packet Take 17 g by mouth 2 (two) times daily. Take 17g PO BID until you have daily soft bowel movements, then you may decrease dose to 17g PO QD 08/31/14   [address]cedes [address], PA-C   Meds Ordered and Administered this Visit  Medications - No data to display  BP (!) 153/64 (BP Location: Left Arm) Comment: notified rn  Pulse 61   Temp 98.7 F (37.1 C) (Oral)   Resp 14   SpO2 100%  No data found.   Physical Exam  Constitutional: She is oriented to person, place, and time. She appears well-developed and well-nourished.  Cardiovascular: Normal rate, regular rhythm and normal heart  sounds.   Pulmonary/Chest: Effort normal and breath sounds normal.  Musculoskeletal:  Patient does have  full range of motion of her shoulders, elbows, wrists in all her fingers. There is no deformity, edema or erythema noted. Patient reports tenderness on palpation over her fingers, wrists, elbows and her shoulders.   Neurological: She is alert and oriented to person, place, and time.  Skin: Skin is warm and dry.  Nursing note and vitals reviewed.   Urgent Care Course    Procedures (including critical care time)  Labs Review Labs Reviewed - No data to display  Imaging Review No results found.  MDM   1. Other chronic pain   2. Pain in both upper extremities  Your pain today appears to be chronic. Unfortunately, there isn't much we can offer here as an urgent care. I don't think xray would be beneficial today.  I would suggest you to start taking ibuprofen over-the-counter. You may take ibuprofen up to 800 mg 3 times a day for this pain. I also suggest to follow-up with your primary care doctor next week for further evaluation.    Barry Dienes, NP 06/06/16 1349

## 2016-06-23 ENCOUNTER — Other Ambulatory Visit: Payer: Self-pay | Admitting: Family Medicine

## 2016-06-23 DIAGNOSIS — K219 Gastro-esophageal reflux disease without esophagitis: Secondary | ICD-10-CM

## 2016-07-27 ENCOUNTER — Ambulatory Visit (HOSPITAL_COMMUNITY)
Admission: RE | Admit: 2016-07-27 | Discharge: 2016-07-27 | Disposition: A | Discharge status: Home / Self Care | Payer: Medicaid Other | Source: Ambulatory Visit | Attending: Family Medicine | Admitting: Family Medicine

## 2016-07-27 ENCOUNTER — Encounter: Payer: Self-pay | Admitting: Family Medicine

## 2016-07-27 ENCOUNTER — Ambulatory Visit (INDEPENDENT_AMBULATORY_CARE_PROVIDER_SITE_OTHER): Payer: Medicaid Other | Admitting: Family Medicine

## 2016-07-27 VITALS — BP 136/62 | HR 82 | Temp 98.2°F | Resp 16 | Ht 64.0 in | Wt 218.0 lb

## 2016-07-27 DIAGNOSIS — G8929 Other chronic pain: Secondary | ICD-10-CM | POA: Diagnosis present

## 2016-07-27 DIAGNOSIS — M25511 Pain in right shoulder: Secondary | ICD-10-CM | POA: Diagnosis not present

## 2016-07-27 DIAGNOSIS — I10 Essential (primary) hypertension: Secondary | ICD-10-CM | POA: Diagnosis not present

## 2016-07-27 DIAGNOSIS — Z794 Long term (current) use of insulin: Secondary | ICD-10-CM

## 2016-07-27 DIAGNOSIS — E1165 Type 2 diabetes mellitus with hyperglycemia: Secondary | ICD-10-CM

## 2016-07-27 DIAGNOSIS — R809 Proteinuria, unspecified: Secondary | ICD-10-CM

## 2016-07-27 DIAGNOSIS — M129 Arthropathy, unspecified: Secondary | ICD-10-CM | POA: Diagnosis not present

## 2016-07-27 DIAGNOSIS — E118 Type 2 diabetes mellitus with unspecified complications: Secondary | ICD-10-CM | POA: Diagnosis not present

## 2016-07-27 DIAGNOSIS — IMO0002 Reserved for concepts with insufficient information to code with codable children: Secondary | ICD-10-CM

## 2016-07-27 LAB — CBC WITH DIFFERENTIAL/PLATELET
BASOS ABS: 55 {cells}/uL (ref 0–200)
Basophils Relative: 1 %
EOS ABS: 110 {cells}/uL (ref 15–500)
Eosinophils Relative: 2 %
HCT: 33 % — ABNORMAL LOW (ref 35.0–45.0)
Hemoglobin: 9.9 g/dL — ABNORMAL LOW (ref 11.7–15.5)
LYMPHS PCT: 32 %
Lymphs Abs: 1760 cells/uL (ref 850–3900)
MCH: 25.5 pg — AB (ref 27.0–33.0)
MCHC: 30 g/dL — ABNORMAL LOW (ref 32.0–36.0)
MCV: 85.1 fL (ref 80.0–100.0)
MONOS PCT: 6 %
MPV: 10.9 fL (ref 7.5–12.5)
Monocytes Absolute: 330 cells/uL (ref 200–950)
Neutro Abs: 3245 cells/uL (ref 1500–7800)
Neutrophils Relative %: 59 %
PLATELETS: 276 10*3/uL (ref 140–400)
RBC: 3.88 MIL/uL (ref 3.80–5.10)
RDW: 14 % (ref 11.0–15.0)
WBC: 5.5 10*3/uL (ref 3.8–10.8)

## 2016-07-27 LAB — POCT URINALYSIS DIP (DEVICE)
BILIRUBIN URINE: NEGATIVE
Glucose, UA: 100 mg/dL — AB
Ketones, ur: NEGATIVE mg/dL
NITRITE: NEGATIVE
PH: 5.5 (ref 5.0–8.0)
Protein, ur: 100 mg/dL — AB
Specific Gravity, Urine: 1.015 (ref 1.005–1.030)
UROBILINOGEN UA: 0.2 mg/dL (ref 0.0–1.0)

## 2016-07-27 LAB — POCT GLYCOSYLATED HEMOGLOBIN (HGB A1C): HEMOGLOBIN A1C: 11.5

## 2016-07-27 LAB — GLUCOSE, CAPILLARY: Glucose-Capillary: 284 mg/dL — ABNORMAL HIGH (ref 65–99)

## 2016-07-27 MED ORDER — ACETAMINOPHEN-CODEINE #3 300-30 MG PO TABS: 1.0000 | ORAL_TABLET | ORAL | 0 refills | Status: DC | PRN

## 2016-07-27 MED ORDER — INSULIN GLARGINE 100 UNIT/ML SOLOSTAR PEN: 35.0000 [IU] | PEN_INJECTOR | Freq: Every day | SUBCUTANEOUS | 3 refills | Status: DC

## 2016-07-27 NOTE — Patient Instructions (Addendum)
Refrain from taking non steroidal anti inflammatory medication (Aleve, Ibuprofen, Naproxen)  Right shoulder Pain: Will start Tylenol # 3 every 4 hours as needed. Do no drink, drive, or operate machinery while taking medication.  Refrain from lifting greater than 10 pounds Apply ice to right shoulder interchangeably with heat  4 times per day.  Will call with xray results  Diabetes mellitus:  Will increase Lantus to 35 units at bedtime Continue sliding scale Novolog for uncontrolled diabetes mellitus  The patient is asked to make an attempt to improve diet and activity patterns to aid in medical management of this problem.

## 2016-07-27 NOTE — Progress Notes (Signed)
Subjective:    Patient ID: Rachel Gonzalez, female    DOB: [birth year], 66 y.o.   MRN: [id number]  HPI  [name], a 66 year old female with a history of hypertension and diabetes mellitus type 2 presents for a follow up of diabetes mellitus.  She is not exercising and is not adherent to low salt diet. She does not check blood pressure at home.  Patient denies chest pain, chest pressure/discomfort, fatigue, irregular heart beat, near-syncope, palpitations and tachypnea.  Cardiovascular risk factors include: diabetes mellitus and sedentary lifestyle.    Patient also has a history of uncontrolled type 2 diabetes mellitus. She states that she has been taking medication  consistently. 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  She states that she has been using Lantus 20 units HS and sliding scale inconsistently. she has been eating more sweets lately. She did not bring glucometer to appointment.  Patient denies new foot ulcerations, increase appetite, nausea, paresthesia of the feet, polydipsia, polyuria, visual disturbances, vomitting and weight loss.    She is also complaining of right shoulder pain. Pain has been present for greater than 1 year. She says that pain has worsened over the past several weeks. 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 Her husband and son have been assisting with bathing and dressing. She has taken Aleve, Ibuprofen and Goody's headache powders without sustained relief. Current pain intensity is 8/10 described as constant and throbbing.   Past Medical History:  Diagnosis Date  . Anemia   . Diabetes mellitus   . Hypertension   . Renal disorder    Immunization History  Administered Date(s) Administered  . Pneumococcal Polysaccharide-23 02/12/2015  . Td 05/07/2014  . Tdap 11/21/2007   Allergies  Allergen Reactions  . Tramadol Nausea And Vomiting   Social History   Social History  . Marital status: Married    Spouse  name: N/A  . Number of children: N/A  . Years of education: N/A   Occupational History  . Not on file.   Social History Main Topics  . Smoking status: Never Smoker  . Smokeless tobacco: Never Used  . Alcohol use No  . Drug use: No  . Sexual activity: Not on file   Other Topics Concern  . Not on file   Social History Narrative   From [place].   Married.   Three children, 9 grandchildren.   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   Travels to [place], [address]      Review of Systems  Constitutional: Positive for fatigue and unexpected weight change. Negative for fever.  HENT: Negative.   Respiratory: Negative.   Cardiovascular: Negative.  Negative for chest pain, palpitations and leg swelling.  Gastrointestinal: Negative.   Endocrine: Negative for polydipsia, polyphagia and polyuria.  Genitourinary: Negative.  Negative for hematuria.  Musculoskeletal: Positive for arthralgias and myalgias (right foot pain/ ambulates with cane.  Right shoulder pain. ).       S/P right great toe amputation   Right shoulder pain  Skin: Negative.   Allergic/Immunologic: Positive for environmental allergies.  Neurological: Negative.  Negative for dizziness, facial asymmetry, weakness, light-headedness and numbness.  Hematological: Negative.   Psychiatric/Behavioral: Negative.        Objective:   Physical Exam  Constitutional: She appears well-developed and well-nourished. She is active. She does not have a sickly appearance.  HENT:  Head: Normocephalic and atraumatic.  Nose: Mucosal edema present.  Eyes: Lids are normal.  Neck: Normal range of motion and full passive range of motion without pain. Neck supple.  Cardiovascular: Normal rate, regular rhythm, S1 normal, normal heart sounds and normal pulses.   Bilateral lower extremity edema (2 +)  Pulmonary/Chest: Effort normal and breath sounds normal.  Abdominal: Soft. Normal appearance  and bowel sounds are normal. There is no tenderness.  Musculoskeletal:       Right ankle: She exhibits decreased range of motion. She exhibits no swelling and normal pulse.  Right great toe amputation  Lymphadenopathy:       Head (right side): No submental and no submandibular adenopathy present.       Head (left side): No submental and no submandibular adenopathy present.  Neurological: She is alert.  Skin: Skin is warm, dry and intact.  Hyperpigmented scar to left anterior neck  Psychiatric: Her speech is normal and behavior is normal. Judgment and thought content normal. Cognition and memory are normal.      BP 136/62 (BP Location: Left Arm, Patient Position: Sitting, Cuff Size: Normal)   Pulse 82   Temp 98.2 F (36.8 C) (Oral)   Resp 16   Ht 5\' 4"  (1.626 m)   Wt 218 lb (98.9 kg)   SpO2 99%   BMI 37.42 kg/m  Assessment & Plan:  1. Uncontrolled type 2 diabetes mellitus with complication, with long-term current use of insulin (HCC) - HgB A1c - Comprehensive metabolic panel - CBC with Differential  2. Chronic right shoulder pain Worsening right shoulder pain. Will get a right shoulder xray to rule out acute injury. I will defer to orthopedics for further evaluation.  -Refrain from lifting greater than 10 pounds -Apply heat and cold interchangeably - DG Shoulder Right; Future - Insulin Glargine (LANTUS SOLOSTAR) 100 UNIT/ML Solostar Pen; Inject 35 Units into the skin daily at 10 pm.  Dispense: 15 mL; Refill: 3 -Reviewed [place] Substance Reporting system prior to prescribing opiate medications. No inconsistencies noted.   - Rheumatoid factor - acetaminophen-codeine (TYLENOL #3) 300-30 MG tablet; Take 1 tablet by mouth every 4 (four) hours as needed for moderate pain.  Dispense: 30 tablet; Refill: 0 - AMB referral to orthopedics  3. Essential hypertension Blood pressure is controlled on current medication regimen.  - CBC with Differential  4. Proteinuria, unspecified type -  Microalbumin/Creatinine Ratio, Urine  5. Arthritis/arthropathy of multiple joints - Sedimentation Rate   RTC: Will follow up by phone with xray results   Nolon NationsLaChina Moore Marrell Dicaprio  MSN, FNP-C [facility] [address]  [place], [place] [id number] [phone]

## 2016-07-28 ENCOUNTER — Telehealth: Payer: Self-pay

## 2016-07-28 LAB — COMPREHENSIVE METABOLIC PANEL
ALT: 8 U/L (ref 6–29)
AST: 9 U/L — AB (ref 10–35)
Albumin: 3.5 g/dL — ABNORMAL LOW (ref 3.6–5.1)
Alkaline Phosphatase: 204 U/L — ABNORMAL HIGH (ref 33–130)
BUN: 29 mg/dL — ABNORMAL HIGH (ref 7–25)
CALCIUM: 9.2 mg/dL (ref 8.6–10.4)
CHLORIDE: 105 mmol/L (ref 98–110)
CO2: 25 mmol/L (ref 20–31)
CREATININE: 1.33 mg/dL — AB (ref 0.50–0.99)
Glucose, Bld: 273 mg/dL — ABNORMAL HIGH (ref 65–99)
Potassium: 4.5 mmol/L (ref 3.5–5.3)
Sodium: 139 mmol/L (ref 135–146)
Total Bilirubin: 0.3 mg/dL (ref 0.2–1.2)
Total Protein: 6.3 g/dL (ref 6.1–8.1)

## 2016-07-28 LAB — RHEUMATOID FACTOR

## 2016-07-28 LAB — SEDIMENTATION RATE: Sed Rate: 58 mm/hr — ABNORMAL HIGH (ref 0–30)

## 2016-07-28 LAB — MICROALBUMIN / CREATININE URINE RATIO
CREATININE, URINE: 105 mg/dL (ref 20–320)
MICROALB UR: 20.4 mg/dL
Microalb Creat Ratio: 194 mcg/mg creat — ABNORMAL HIGH (ref <30)

## 2016-07-28 NOTE — Telephone Encounter (Signed)
-----   Message from Massie MaroonLachina M Hollis, [place] sent at 07/27/2016  9:53 PM EDT ----- Regarding: image results Please inform patient that shoulder xray was unremarkable. Referral has been sent to orthopedic specialist for further evaluation. Continue using warm and cool compresses interchangeably. Also, tylenol # 3 every 4 hours as needed for moderate to severe pain. Refrain from using NSAIDs. Will follow up for DMII as scheduled  Thanks ----- Message ----- From: Interface, Rad Results In Sent: 07/27/2016   2:12 PM To: Massie MaroonLachina M Hollis, FNP

## 2016-07-28 NOTE — Telephone Encounter (Signed)
Called and left message, advised of negative x ray and that we would refer her to orthopedic for further evaluation. Advised that she should still continue the warm and cool compresses interchangeably and can use Tylenol #3 every 4 hours as needed for pain. Advised to stay from using NSAIDs like Ibuprofen and Aleve. Asked that patient keeps next scheduled appointment and call us if she has any questions. Thanks!

## 2016-08-05 ENCOUNTER — Encounter (INDEPENDENT_AMBULATORY_CARE_PROVIDER_SITE_OTHER): Payer: Self-pay | Admitting: Orthopedic Surgery

## 2016-08-05 ENCOUNTER — Ambulatory Visit (INDEPENDENT_AMBULATORY_CARE_PROVIDER_SITE_OTHER): Payer: Medicaid Other

## 2016-08-05 ENCOUNTER — Ambulatory Visit (INDEPENDENT_AMBULATORY_CARE_PROVIDER_SITE_OTHER): Payer: Medicaid Other | Admitting: Orthopedic Surgery

## 2016-08-05 DIAGNOSIS — M25511 Pain in right shoulder: Secondary | ICD-10-CM

## 2016-08-05 DIAGNOSIS — G8929 Other chronic pain: Secondary | ICD-10-CM

## 2016-08-05 DIAGNOSIS — M542 Cervicalgia: Secondary | ICD-10-CM

## 2016-08-06 NOTE — Progress Notes (Signed)
Office Visit Note   Patient: Rachel Gonzalez           Date of Birth: 01-11-1951           MRN: [id number] Visit Date: 08/05/2016 Requested by: Massie Maroon, FNP [address] [place], [name] [address] PCP: Massie Maroon, FNP  Subjective: Chief Complaint  Patient presents with  . Right Shoulder - Pain    HPI: Rachel Gonzalez is a 66 year old patient with shoulder and arm pain.  She states this is been going on for a year.  Call bothers it makes it worse.  She states that the pain is gone down some since she was given Tylenol No. 3 from her primary care provider.  She states that her shoulder and neck feels stiff.  She had radiographs done at [facility] in May which were normal of the right shoulder.  She states that the pain runs from the shoulder into the neck but she denies any discrete numbness and tingling in the hand.  She does describe right middle finger pain.  She also does not report any shoulder blade pain.  She states the pain runs from the elbow up and from the elbow down into the arm.  She is disabled by her report from having an amputated toe.  She has been exercising some.  She describes decreased range of motion right shoulder versus left.  She is convinced she has arthritis in her shoulder.              ROS: All systems reviewed are negative as they relate to the chief complaint within the history of present illness.  Patient denies  fevers or chills.   Assessment & Plan: Visit Diagnoses:  1. Cervicalgia   2. Chronic right shoulder pain     Plan: Impression is unclear clinical picture with right upper extremity pain which could be early frozen shoulder or it could be early radicular symptoms.  Plan is physical therapy to work on the frozen shoulder MRI of the cervical spine to evaluate right-sided radiculopathy.  I'll see her back after that study to see how its going.  She should continue with anti-inflammatories and her own range of motion  exercises.  Follow-Up Instructions: Return for after MRI.   Orders:  Orders Placed This Encounter  Procedures  . XR Cervical Spine 2 or 3 views  . MR Cervical Spine w/o contrast   No orders of the defined types were placed in this encounter.     Procedures: No procedures performed   Clinical Data: No additional findings.  Objective: Vital Signs: There were no vitals taken for this visit.  Physical Exam:   Constitutional: Patient appears well-developed HEENT:  Head: Normocephalic Eyes:EOM are normal Neck: Normal range of motion Cardiovascular: Normal rate Pulmonary/chest: Effort normal Neurologic: Patient is alert Skin: Skin is warm Psychiatric: Patient has normal mood and affect    Ortho Exam: Orthopedic exam demonstrates reasonable shoes cervical spine range of motion with 5 out of 5 grip EPL FPL interosseous wrist flexion-extension biceps triceps and deltoid strength.  Symmetric reflexes, biceps and triceps bilaterally palpable radial pulses bilaterally.  She has pretty full cervical spine range of motion but with some pain with rotation to the right.  No discrete acromioclavicular joint tenderness right or left.  She has about 5-10 less passive range of motion of the right compared to the left.  No other masses lymph adenopathy or skin changes noted in the shoulder girdle  or arm region.  No muscle atrophy noted in the right arm region.  She does have pain with the extremes of range of motion of the shoulder.  Specialty Comments:  No specialty comments available.  Imaging: Xr Cervical Spine 2 Or 3 Views  Result Date: 08/06/2016 Cervical spine AP lateral reviewed.  Mild degenerative disc space disease noted at C6-7.  Mild facet arthritis also present.  Otherwise normal lordosis is maintained.  No other soft tissue calcifications noted within the cervical spine.    PMFS History: Patient Active Problem List   Diagnosis Date Noted  . Frequent falls 04/28/2016  .  Right arm weakness 04/28/2016  . Bilateral lower extremity edema 04/28/2016  . Screening for colon cancer 04/28/2016  . Abnormal urinalysis 09/13/2014  . Chest pain 09/13/2014  . Abdominal pain, recurrent 09/13/2014  . Dehydration with hyponatremia 09/13/2014  . Acute hyperkalemia 09/13/2014  . Acute renal failure superimposed on stage 3 chronic kidney disease (HCC) 09/13/2014  . Anemia 05/06/2014  . Diabetic neuropathy, type II diabetes mellitus (HCC) 05/06/2014  . Obesity (BMI 30-39.9) 05/06/2014  . Cellulitis of right foot 05/06/2014  . Leg swelling 03/31/2011  . Essential hypertension 03/07/2011  . 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 Right arm pain 08/04/2006  . OSTEOARTHRITIS 02/14/2006  . Diabetes mellitus type 2, uncontrolled (HCC) 01/11/2006   Past Medical History:  Diagnosis Date  . Anemia   . Diabetes mellitus   . Hypertension   . Renal disorder     Family History  Problem Relation Age of Onset  . Diabetes Mother     Past Surgical History:  Procedure Laterality Date  . AMPUTATION Right 05/08/2014   Procedure: AMPUTATION RAY, right great toe;  Surgeon: Nadara MustardMarcus Duda V, MD;  Location: [facility] OR;  Service: Orthopedics;  Laterality: Right;  . arm surgery     . CESAREAN SECTION    . I&D EXTREMITY Right 05/08/2014   Procedure: IRRIGATION AND DEBRIDEMENT FOOT;  Surgeon: Nadara MustardMarcus Duda V, MD;  Location: [facility] OR;  Service: Orthopedics;  Laterality: Right;   Social History   Occupational History  . Not on file.   Social History Main Topics  . Smoking status: Never Smoker  . Smokeless tobacco: Never Used  . Alcohol use No  . Drug use: No  . Sexual activity: Not on file

## 2016-08-07 ENCOUNTER — Other Ambulatory Visit: Payer: Self-pay

## 2016-08-07 MED ORDER — LISINOPRIL 10 MG PO TABS: 10.0000 mg | ORAL_TABLET | Freq: Every day | ORAL | 1 refills | Status: DC

## 2016-08-07 NOTE — Telephone Encounter (Signed)
Refill for lisinopril sent into pharmacy. Thanks!  

## 2016-08-27 ENCOUNTER — Ambulatory Visit (INDEPENDENT_AMBULATORY_CARE_PROVIDER_SITE_OTHER): Payer: Medicaid Other | Admitting: Family Medicine

## 2016-08-27 ENCOUNTER — Encounter: Payer: Self-pay | Admitting: Family Medicine

## 2016-08-27 VITALS — BP 119/65 | HR 79 | Temp 98.5°F | Resp 16 | Ht 64.0 in | Wt 217.0 lb

## 2016-08-27 DIAGNOSIS — E118 Type 2 diabetes mellitus with unspecified complications: Secondary | ICD-10-CM | POA: Diagnosis not present

## 2016-08-27 DIAGNOSIS — Z794 Long term (current) use of insulin: Secondary | ICD-10-CM

## 2016-08-27 DIAGNOSIS — M25511 Pain in right shoulder: Secondary | ICD-10-CM | POA: Diagnosis not present

## 2016-08-27 DIAGNOSIS — E1165 Type 2 diabetes mellitus with hyperglycemia: Secondary | ICD-10-CM | POA: Diagnosis not present

## 2016-08-27 DIAGNOSIS — IMO0002 Reserved for concepts with insufficient information to code with codable children: Secondary | ICD-10-CM

## 2016-08-27 DIAGNOSIS — I10 Essential (primary) hypertension: Secondary | ICD-10-CM

## 2016-08-27 DIAGNOSIS — G8929 Other chronic pain: Secondary | ICD-10-CM

## 2016-08-27 LAB — POCT URINALYSIS DIP (DEVICE)
Bilirubin Urine: NEGATIVE
Glucose, UA: 500 mg/dL — AB
Ketones, ur: NEGATIVE mg/dL
LEUKOCYTES UA: NEGATIVE
NITRITE: NEGATIVE
Protein, ur: 30 mg/dL — AB
Specific Gravity, Urine: 1.015 (ref 1.005–1.030)
UROBILINOGEN UA: 0.2 mg/dL (ref 0.0–1.0)
pH: 5.5 (ref 5.0–8.0)

## 2016-08-27 LAB — GLUCOSE, CAPILLARY: GLUCOSE-CAPILLARY: 273 mg/dL — AB (ref 65–99)

## 2016-08-27 LAB — POCT GLYCOSYLATED HEMOGLOBIN (HGB A1C): Hemoglobin A1C: 11.9

## 2016-08-27 MED ORDER — ACETAMINOPHEN-CODEINE #3 300-30 MG PO TABS: 1.0000 | ORAL_TABLET | ORAL | 0 refills | Status: DC | PRN

## 2016-08-27 MED ORDER — INSULIN ASPART 100 UNIT/ML CARTRIDGE (PENFILL): 8.0000 [IU] | Freq: Three times a day (TID) | SUBCUTANEOUS | 3 refills | Status: DC

## 2016-08-27 NOTE — Patient Instructions (Addendum)
Uncontrolled diabetes mellitus:   Will continue lantus 35 units at bedtime Will Increase Novolog to 8 units for meal coverage.  Recommend a lowfat, low carbohydrate diet divided over 5-6 small meals, increase water intake to 6-8 glasses, and 150 minutes per week of cardiovascular exercise.    Hypertension:  Blood pressure is at goal.  Will continue medications at current dosages.   Chronic right shoulder pain:  A referral was sent to physical therapy. Schedule appointment. Also, call Dr. August Saucer, orthopedic specialist to inquire about MRI   Diabetes Mellitus and Food It is important for you to manage your blood sugar (glucose) level. Your blood glucose level can be greatly affected by what you eat. Eating healthier foods in the appropriate amounts throughout the day at about the same time each day will help you control your blood glucose level. It can also help slow or prevent worsening of your diabetes mellitus. Healthy eating may even help you improve the level of your blood pressure and reach or maintain a healthy weight. General recommendations for healthful eating and cooking habits include:  Eating meals and snacks regularly. Avoid going long periods of time without eating to lose weight.  Eating a diet that consists mainly of plant-based foods, such as fruits, vegetables, nuts, legumes, and whole grains.  Using low-heat cooking methods, such as baking, instead of high-heat cooking methods, such as deep frying.  Work with your dietitian to make sure you understand how to use the Nutrition Facts information on food labels. How can food affect me? Carbohydrates Carbohydrates affect your blood glucose level more than any other type of food. Your dietitian will help you determine how many carbohydrates to eat at each meal and teach you how to count carbohydrates. Counting carbohydrates is important to keep your blood glucose at a healthy level, especially if you are using insulin or taking  certain medicines for diabetes mellitus. Alcohol Alcohol can cause sudden decreases in blood glucose (hypoglycemia), especially if you use insulin or take certain medicines for diabetes mellitus. Hypoglycemia can be a life-threatening condition. Symptoms of hypoglycemia (sleepiness, dizziness, and disorientation) are similar to symptoms of having too much alcohol. If your health care provider has given you approval to drink alcohol, do so in moderation and use the following guidelines:  Women should not have more than one drink per day, and men should not have more than two drinks per day. One drink is equal to: ? 12 oz of beer. ? 5 oz of wine. ? 1 oz of hard liquor.  Do not drink on an empty stomach.  Keep yourself hydrated. Have water, diet soda, or unsweetened iced tea.  Regular soda, juice, and other mixers might contain a lot of carbohydrates and should be counted.  What foods are not recommended? As you make food choices, it is important to remember that all foods are not the same. Some foods have fewer nutrients per serving than other foods, even though they might have the same number of calories or carbohydrates. It is difficult to get your body what it needs when you eat foods with fewer nutrients. Examples of foods that you should avoid that are high in calories and carbohydrates but low in nutrients include:  Trans fats (most processed foods list trans fats on the Nutrition Facts label).  Regular soda.  Juice.  Candy.  Sweets, such as cake, pie, doughnuts, and cookies.  Fried foods.  What foods can I eat? Eat nutrient-rich foods, which will nourish your body and  keep you healthy. The food you should eat also will depend on several factors, including:  The calories you need.  The medicines you take.  Your weight.  Your blood glucose level.  Your blood pressure level.  Your cholesterol level.  You should eat a variety of foods, including:  Protein. ? Lean  cuts of meat. ? Proteins low in saturated fats, such as fish, egg whites, and beans. Avoid processed meats.  Fruits and vegetables. ? Fruits and vegetables that may help control blood glucose levels, such as apples, mangoes, and yams.  Dairy products. ? Choose fat-free or low-fat dairy products, such as milk, yogurt, and cheese.  Grains, bread, pasta, and rice. ? Choose whole grain products, such as multigrain bread, whole oats, and brown rice. These foods may help control blood pressure.  Fats. ? Foods containing healthful fats, such as nuts, avocado, olive oil, canola oil, and fish.  Does everyone with diabetes mellitus have the same meal plan? Because every person with diabetes mellitus is different, there is not one meal plan that works for everyone. It is very important that you meet with a dietitian who will help you create a meal plan that is just right for you. This information is not intended to replace advice given to you by your health care provider. Make sure you discuss any questions you have with your health care provider. Document Released: 11/20/2004 Document Revised: 08/01/2015 Document Reviewed: 01/20/2013 Elsevier Interactive Patient Education  2017 [employer]  Carbohydrate Counting for Diabetes Mellitus, Adult Carbohydrate counting is a method for keeping track of how many carbohydrates you eat. Eating carbohydrates naturally increases the amount of sugar (glucose) in the blood. Counting how many carbohydrates you eat helps keep your blood glucose within normal limits, which helps you manage your diabetes (diabetes mellitus). It is important to know how many carbohydrates you can safely have in each meal. This is different for every person. A diet and nutrition specialist (registered dietitian) can help you make a meal plan and calculate how many carbohydrates you should have at each meal and snack. Carbohydrates are found in the following foods:  Grains, such as  breads and cereals.  Dried beans and soy products.  Starchy vegetables, such as potatoes, peas, and corn.  Fruit and fruit juices.  Milk and yogurt.  Sweets and snack foods, such as cake, cookies, candy, chips, and soft drinks.  How do I count carbohydrates? There are two ways to count carbohydrates in food. You can use either of the methods or a combination of both. Reading "Nutrition Facts" on packaged food The "Nutrition Facts" list is included on the labels of almost all packaged foods and beverages in the [place] It includes:  The serving size.  Information about nutrients in each serving, including the grams (g) of carbohydrate per serving.  To use the "Nutrition Facts":  Decide how many servings you will have.  Multiply the number of servings by the number of carbohydrates per serving.  The resulting number is the total amount of carbohydrates that you will be having.  Learning standard serving sizes of other foods When you eat foods containing carbohydrates that are not packaged or do not include "Nutrition Facts" on the label, you need to measure the servings in order to count the amount of carbohydrates:  Measure the foods that you will eat with a food scale or measuring cup, if needed.  Decide how many standard-size servings you will eat.  Multiply the number of servings by  15. Most carbohydrate-rich foods have about 15 g of carbohydrates per serving. ? For example, if you eat 8 oz (170 g) of strawberries, you will have eaten 2 servings and 30 g of carbohydrates (2 servings x 15 g = 30 g).  For foods that have more than one food mixed, such as soups and casseroles, you must count the carbohydrates in each food that is included.  The following list contains standard serving sizes of common carbohydrate-rich foods. Each of these servings has about 15 g of carbohydrates:   hamburger bun or  English muffin.   oz (15 mL) syrup.   oz (14 g) jelly.  1 slice of  bread.  1 six-inch tortilla.  3 oz (85 g) cooked rice or pasta.  4 oz (113 g) cooked dried beans.  4 oz (113 g) starchy vegetable, such as peas, corn, or potatoes.  4 oz (113 g) hot cereal.  4 oz (113 g) mashed potatoes or  of a large baked potato.  4 oz (113 g) canned or frozen fruit.  4 oz (120 mL) fruit juice.  4-6 crackers.  6 chicken nuggets.  6 oz (170 g) unsweetened dry cereal.  6 oz (170 g) plain fat-free yogurt or yogurt sweetened with artificial sweeteners.  8 oz (240 mL) milk.  8 oz (170 g) fresh fruit or one small piece of fruit.  24 oz (680 g) popped popcorn.  Example of carbohydrate counting Sample meal  3 oz (85 g) chicken breast.  6 oz (170 g) brown rice.  4 oz (113 g) corn.  8 oz (240 mL) milk.  8 oz (170 g) strawberries with sugar-free whipped topping. Carbohydrate calculation 1. Identify the foods that contain carbohydrates: ? Rice. ? Corn. ? Milk. ? Strawberries. 2. Calculate how many servings you have of each food: ? 2 servings rice. ? 1 serving corn. ? 1 serving milk. ? 1 serving strawberries. 3. Multiply each number of servings by 15 g: ? 2 servings rice x 15 g = 30 g. ? 1 serving corn x 15 g = 15 g. ? 1 serving milk x 15 g = 15 g. ? 1 serving strawberries x 15 g = 15 g. 4. Add together all of the amounts to find the total grams of carbohydrates eaten: ? 30 g + 15 g + 15 g + 15 g = 75 g of carbohydrates total. This information is not intended to replace advice given to you by your health care provider. Make sure you discuss any questions you have with your health care provider. Document Released: 02/23/2005 Document Revised: 09/13/2015 Document Reviewed: 08/07/2015 Elsevier Interactive Patient Education  2018 [employer].  Adhesive Capsulitis Adhesive capsulitis is inflammation of the tendons and ligaments that surround the shoulder joint (shoulder capsule). This condition causes the shoulder to become stiff and painful to  move. Adhesive capsulitis is also called frozen shoulder. What are the causes? This condition may be caused by:  An injury to the shoulder joint.  Straining the shoulder.  Not moving the shoulder for a period of time. This can happen if your arm was injured or in a sling.  Long-standing health problems, such as: ? Diabetes. ? Thyroid problems. ? Heart disease. ? Stroke. ? Rheumatoid arthritis. ? Lung disease.  In some cases, the cause may not be known. What increases the risk? This condition is more likely to develop in:  Women.  People who are older than 65 years of age.  What are the signs  or symptoms? Symptoms of this condition include:  Pain in the shoulder when moving the arm. There may also be pain when parts of the shoulder are touched. The pain is worse at night or when at rest.  Soreness or aching in the shoulder.  Inability to move the shoulder normally.  Muscle spasms.  How is this diagnosed? This condition is diagnosed with a physical exam and imaging tests, such as an X-ray or MRI. How is this treated? This condition may be treated with:  Treatment of the underlying cause or condition.  Physical therapy. This involves performing exercises to get the shoulder moving again.  Medicine. Medicine may be given to relieve pain, inflammation, or muscle spasms.  Steroid injections into the shoulder joint.  Shoulder manipulation. This is a procedure to move the shoulder into another position. It is done after you are given a medicine to make you fall asleep (general anesthetic). The joint may also be injected with salt water at high pressure to break down scarring.  Surgery. This may be done in severe cases when other treatments have failed.  Although most people recover completely from adhesive capsulitis, some may not regain the full movement of the shoulder. Follow these instructions at home:  Take over-the-counter and prescription medicines only as told  by your health care provider.  If you are being treated with physical therapy, follow instructions from your physical therapist.  Avoid exercises that put a lot of demand on your shoulder, such as throwing. These exercises can make pain worse.  If directed, apply ice to the injured area: ? Put ice in a plastic bag. ? Place a towel between your skin and the bag. ? Leave the ice on for 20 minutes, 2-3 times per day. Contact a health care provider if:  You develop new symptoms.  Your symptoms get worse. This information is not intended to replace advice given to you by your health care provider. Make sure you discuss any questions you have with your health care provider. Document Released: 12/21/2008 Document Revised: 08/01/2015 Document Reviewed: 06/18/2014 Elsevier Interactive Patient Education  [employer].

## 2016-08-27 NOTE — Progress Notes (Signed)
Subjective:    Patient ID: Rachel Gonzalez, female    DOB: 1950-11-30, 66 y.o.   MRN: [id number]  HPI  Rachel Gonzalez, a 66 year old female with a history of hypertension and diabetes mellitus type 2 presents for a follow up of diabetes mellitus.  She is not exercising and is not adherent to low salt diet. She does not check blood pressure at home.  Patient denies chest pain, chest pressure/discomfort, fatigue, irregular heart beat, near-syncope, palpitations and tachypnea.  Cardiovascular risk factors include: diabetes mellitus and sedentary lifestyle.    Patient also has a history of uncontrolled type 2 diabetes mellitus. 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  She states that she has been using Lantus 35 units HS and 6 units of Novolog prior to meals. She says that she does not always use Novolog consistently. She often forgets to take medication or leaves home without taking medication.She did not bring glucometer to appointment.  Patient denies new foot ulcerations, increase appetite, nausea, paresthesia of the feet, polydipsia, polyuria, visual disturbances, vomitting and weight loss.    She is also continues to complain of right shoulder pain. She was evaluated by Dr. Dorene GrebeScott Dean,  orthopedic specialist, on 6.21/2018. 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 An MRI and physical therapy were recommended. Rachel Gonzalez did not schedule MRI or PT. She was very confused about treatment plan.  Pain has been present for greater than 1 year. She says that pain has worsened over the past several weeks. 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 Her husband and son have been assisting with bathing and dressing. She has taken Aleve, Ibuprofen and Goody's headache powders without sustained relief. Current pain intensity is 5/10 described as intermittent and throbbing.   Past Medical History:  Diagnosis Date  . Anemia   . Diabetes mellitus   . Hypertension    . Renal disorder    Immunization History  Administered Date(s) Administered  . Pneumococcal Polysaccharide-23 02/12/2015  . Td 05/07/2014  . Tdap 11/21/2007   Allergies  Allergen Reactions  . Tramadol Nausea And Vomiting   Social History   Social History  . Marital status: Married    Spouse name: N/A  . Number of children: N/A  . Years of education: N/A   Occupational History  . Not on file.   Social History Main Topics  . Smoking status: Never Smoker  . Smokeless tobacco: Never Used  . Alcohol use No  . Drug use: No  . Sexual activity: Not on file   Other Topics Concern  . Not on file   Social History Narrative   From [place].   Married.   Three children, 9 grandchildren.   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   [place] [address] [place]      Review of Systems  Constitutional: Positive for fatigue and unexpected weight change. Negative for fever.  HENT: Negative.   Respiratory: Negative.   Cardiovascular: Negative.  Negative for chest pain, palpitations and leg swelling.  Gastrointestinal: Negative.   Endocrine: Negative for polydipsia, polyphagia and polyuria.  Genitourinary: Negative.  Negative for hematuria.  Musculoskeletal: Positive for arthralgias and myalgias (right foot pain/ ambulates with cane.  Right shoulder pain. ).       S/P right great toe amputation   Right shoulder pain  Skin: Negative.   Allergic/Immunologic: Positive for environmental  allergies.  Neurological: Negative.  Negative for dizziness, facial asymmetry, weakness, light-headedness and numbness.  Hematological: Negative.   Psychiatric/Behavioral: Negative.        Objective:   Physical Exam  Constitutional: She appears well-developed and well-nourished. She is active. She does not have a sickly appearance.  HENT:  Head: Normocephalic and atraumatic.  Eyes: Lids are normal.  Neck: Normal range of motion and full passive  range of motion without pain. Neck supple.  Cardiovascular: Normal rate, regular rhythm, S1 normal, normal heart sounds and normal pulses.   Bilateral lower extremity edema (2 +)  Pulmonary/Chest: Effort normal and breath sounds normal.  Abdominal: Soft. Normal appearance and bowel sounds are normal. There is no tenderness.  Musculoskeletal:       Right ankle: She exhibits decreased range of motion. She exhibits no swelling and normal pulse.  Right great toe amputation  Lymphadenopathy:       Head (right side): No submental and no submandibular adenopathy present.       Head (left side): No submental and no submandibular adenopathy present.  Neurological: She is alert.  Skin: Skin is warm, dry and intact.  Hyperpigmented scar to left anterior neck  Psychiatric: Her speech is normal and behavior is normal. Judgment and thought content normal. Cognition and memory are normal.      BP 119/65 (BP Location: Left Arm, Patient Position: Sitting, Cuff Size: Large)   Pulse 79   Temp 98.5 F (36.9 C) (Oral)   Resp 16   Ht 5\' 4"  (1.626 m)   Wt 217 lb (98.4 kg)   SpO2 99%   BMI 37.25 kg/m  Assessment & Plan:   1. Uncontrolled type 2 diabetes mellitus with complication, with long-term current use of insulin (HCC) Hemoglobin a1C is 11.9, will increase Novolog to 8 units prior to meals. Continue to check blood sugars prior to meal.  Continue Lantus 35 units HS There have been some compliance issues here. I have discussed with her the great importance of following the treatment plan exactly as directed in order to achieve a good medical outcome. - Glucose, capillary - HgB A1c - insulin aspart (NOVOLOG) cartridge; Inject 8 Units into the skin 3 (three) times daily with meals.  Dispense: 15 mL; Refill: 3 - POCT urinalysis dip (device)  2. Chronic right shoulder pain Rachel Gonzalez was advised to follow up with Dr. August Saucer as schedule. She will need to have MRI scheduled. I also recommend physical  therapy.Reviewed [place] Substance Reporting system prior to prescribing opiate medications. No inconsistencies noted.   - acetaminophen-codeine (TYLENOL #3) 300-30 MG tablet; Take 1 tablet by mouth every 4 (four) hours as needed for moderate pain.  Dispense: 30 tablet; Refill: 0  3. Essential hypertension  Blood pressure is controlled on current medication regimen.    RTC: 1 month for DMII    The patient was given clear instructions to go to ER or return to medical center if symptoms do not improve, worsen or new problems develop. The patient verbalized understanding.      Nolon Nations  MSN, FNP-C [facility] [address] [place], [name] [address] [phone]

## 2016-08-31 ENCOUNTER — Other Ambulatory Visit: Payer: Self-pay

## 2016-08-31 MED ORDER — LISINOPRIL 10 MG PO TABS: 10.0000 mg | ORAL_TABLET | Freq: Every day | ORAL | 1 refills | Status: DC

## 2016-08-31 NOTE — Telephone Encounter (Signed)
Refill for lisinopril sent into pharmacy  

## 2016-09-21 ENCOUNTER — Ambulatory Visit
Admission: RE | Admit: 2016-09-21 | Discharge: 2016-09-21 | Disposition: A | Discharge status: Home / Self Care | Payer: Medicaid Other | Source: Ambulatory Visit | Attending: Orthopedic Surgery | Admitting: Orthopedic Surgery

## 2016-09-21 DIAGNOSIS — M25511 Pain in right shoulder: Principal | ICD-10-CM

## 2016-09-21 DIAGNOSIS — G8929 Other chronic pain: Secondary | ICD-10-CM

## 2016-09-23 ENCOUNTER — Other Ambulatory Visit: Payer: Self-pay | Admitting: Family Medicine

## 2016-09-23 DIAGNOSIS — J309 Allergic rhinitis, unspecified: Secondary | ICD-10-CM

## 2016-09-28 ENCOUNTER — Encounter: Payer: Self-pay | Admitting: Family Medicine

## 2016-09-28 ENCOUNTER — Ambulatory Visit (INDEPENDENT_AMBULATORY_CARE_PROVIDER_SITE_OTHER): Payer: Medicaid Other | Admitting: Family Medicine

## 2016-09-28 VITALS — BP 136/64 | HR 80 | Temp 98.5°F | Resp 14 | Ht 64.0 in | Wt 220.0 lb

## 2016-09-28 DIAGNOSIS — R600 Localized edema: Secondary | ICD-10-CM | POA: Diagnosis not present

## 2016-09-28 DIAGNOSIS — Z794 Long term (current) use of insulin: Secondary | ICD-10-CM | POA: Diagnosis not present

## 2016-09-28 DIAGNOSIS — E118 Type 2 diabetes mellitus with unspecified complications: Secondary | ICD-10-CM | POA: Diagnosis not present

## 2016-09-28 DIAGNOSIS — M25511 Pain in right shoulder: Secondary | ICD-10-CM

## 2016-09-28 DIAGNOSIS — G8929 Other chronic pain: Secondary | ICD-10-CM

## 2016-09-28 DIAGNOSIS — IMO0002 Reserved for concepts with insufficient information to code with codable children: Secondary | ICD-10-CM

## 2016-09-28 DIAGNOSIS — E1165 Type 2 diabetes mellitus with hyperglycemia: Secondary | ICD-10-CM

## 2016-09-28 LAB — GLUCOSE, CAPILLARY: GLUCOSE-CAPILLARY: 135 mg/dL — AB (ref 65–99)

## 2016-09-28 LAB — COMPLETE METABOLIC PANEL WITH GFR
ALBUMIN: 3.5 g/dL — AB (ref 3.6–5.1)
ALK PHOS: 199 U/L — AB (ref 33–130)
ALT: 8 U/L (ref 6–29)
AST: 9 U/L — AB (ref 10–35)
BILIRUBIN TOTAL: 0.2 mg/dL (ref 0.2–1.2)
BUN: 31 mg/dL — AB (ref 7–25)
CO2: 23 mmol/L (ref 20–31)
CREATININE: 1.64 mg/dL — AB (ref 0.50–0.99)
Calcium: 8.9 mg/dL (ref 8.6–10.4)
Chloride: 109 mmol/L (ref 98–110)
GFR, EST NON AFRICAN AMERICAN: 33 mL/min — AB (ref >=60)
GFR, Est African American: 38 mL/min — ABNORMAL LOW (ref >=60)
GLUCOSE: 88 mg/dL (ref 65–99)
Potassium: 4.2 mmol/L (ref 3.5–5.3)
SODIUM: 141 mmol/L (ref 135–146)
TOTAL PROTEIN: 6.4 g/dL (ref 6.1–8.1)

## 2016-09-28 LAB — POCT URINALYSIS DIP (DEVICE)
BILIRUBIN URINE: NEGATIVE
Glucose, UA: 100 mg/dL — AB
KETONES UR: NEGATIVE mg/dL
Leukocytes, UA: NEGATIVE
NITRITE: NEGATIVE
PH: 5.5 (ref 5.0–8.0)
PROTEIN: 30 mg/dL — AB
Specific Gravity, Urine: 1.02 (ref 1.005–1.030)
Urobilinogen, UA: 0.2 mg/dL (ref 0.0–1.0)

## 2016-09-28 MED ORDER — ACETAMINOPHEN-CODEINE #3 300-30 MG PO TABS: 1.0000 | ORAL_TABLET | ORAL | 0 refills | Status: DC | PRN

## 2016-09-28 NOTE — Progress Notes (Signed)
Subjective:    Patient ID: Rachel Gonzalez, female    DOB: November 24, 1950, 66 y.o.   MRN: [id number]  HPI  Ms. Maziyah Vessel, a 66 year old female with a history of hypertension and diabetes mellitus type 2 presents for a follow up of diabetes mellitus. Ms. Jaffee has not been exercising routinely due to shoulder pain. She has also not been following a carbohydrate modified diet. She has been eating out more over the past month. She states that she has been using Lantus 40 units HS and 6 units of Novolog prior to meals. She has been using Novolog consistently. She did not bring glucometer to appointment.  Patient denies new foot ulcerations, increase appetite, nausea, paresthesia of the feet, polydipsia, polyuria, visual disturbances, vomitting and weight loss.    She is also continues to complain of right shoulder pain. She was evaluated by Dr. Dorene Grebe,  orthopedic specialist, on 6.21/2018. 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 An MRI and physical therapy were recommended. She had MRI on 09/21/2016, but has not been scheduled for follow up. She continues to be confused about treatment plan. Prescribed Tylenol #3 for moderate to severe pain 1 month ago. She says that pain improved with medication. 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 Her husband and son continue to assist her with bathing and dressing.  Current pain intensity is 5/10 described as intermittent and throbbing.   Past Medical History:  Diagnosis Date  . Anemia   . Diabetes mellitus   . Hypertension   . Renal disorder    Immunization History  Administered Date(s) Administered  . Pneumococcal Polysaccharide-23 02/12/2015  . Td 05/07/2014  . Tdap 11/21/2007   Allergies  Allergen Reactions  . Tramadol Nausea And Vomiting   Social History   Social History  . Marital status: Married    Spouse name: N/A  . Number of children: N/A  . Years of education: N/A   Occupational History  . Not on file.    Social History Main Topics  . Smoking status: Never Smoker  . Smokeless tobacco: Never Used  . Alcohol use No  . Drug use: No  . Sexual activity: Not on file   Other Topics Concern  . Not on file   Social History Narrative   From [place].   Married.   Three children, 9 grandchildren.   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   Travels to [place], [address]      Review of Systems  Constitutional: Positive for fatigue and unexpected weight change. Negative for fever.  HENT: Negative.   Respiratory: Negative.   Cardiovascular: Negative.  Negative for chest pain, palpitations and leg swelling.  Gastrointestinal: Negative.   Endocrine: Negative for polydipsia, polyphagia and polyuria.  Genitourinary: Negative.  Negative for hematuria.  Musculoskeletal: Positive for arthralgias and myalgias (right foot pain/ ambulates with cane.  Right shoulder pain. ).       S/P right great toe amputation   Right shoulder pain  Skin: Negative.   Neurological: Negative.  Negative for dizziness, facial asymmetry, weakness, light-headedness and numbness.  Hematological: Negative.   Psychiatric/Behavioral: Negative.        Objective:   Physical Exam  Constitutional: She appears well-developed and well-nourished. She is active. She does not have a sickly appearance.  HENT:  Head: Normocephalic and atraumatic.  Eyes: Lids are normal.  Neck: Normal range of motion and  full passive range of motion without pain. Neck supple.  Cardiovascular: Normal rate, regular rhythm, S1 normal, normal heart sounds and normal pulses.   Bilateral lower extremity edema (2 +)  Pulmonary/Chest: Effort normal and breath sounds normal.  Abdominal: Soft. Normal appearance and bowel sounds are normal. There is no tenderness.  Musculoskeletal:       Right ankle: She exhibits decreased range of motion. She exhibits no swelling and normal pulse.  Right great toe  amputation  Lymphadenopathy:       Head (right side): No submental and no submandibular adenopathy present.       Head (left side): No submental and no submandibular adenopathy present.  Neurological: She is alert.  Skin: Skin is warm, dry and intact.  Hyperpigmented scar to left anterior neck  Psychiatric: Her speech is normal and behavior is normal. Judgment and thought content normal. Cognition and memory are normal.      BP 136/64 (BP Location: Right Arm, Patient Position: Sitting, Cuff Size: Normal) Comment: manual  Pulse 80   Temp 98.5 F (36.9 C) (Oral)   Resp 14   Ht 5\' 4"  (1.626 m)   Wt 220 lb (99.8 kg)   SpO2 99%   BMI 37.76 kg/m  Assessment & Plan:   1. Uncontrolled type 2 diabetes mellitus with complication, with long-term current use of insulin (HCC)  Most recent Hemoglobin a1C 11.9. Continue Novolog at 8 units prior to meals. Continue to check blood sugars prior to meal.  Continue Lantus 35 units HS Reviewed urinalysis, glucosuria and proteinuria has improved.  - COMPLETE METABOLIC PANEL WITH GFR  2. Chronic right shoulder pain [name] was advised to schedule follow up with orthopedic services. She was referred to PT, but has not scheduled. Patient is requesting Tylenol #3, I will prescribe today. Will not manage pain long term.  Current pain is interfering with ADLs and employment. Discussed the importance of adhering to physical therapy and appointments with orthopedic services.  - acetaminophen-codeine (TYLENOL #3) 300-30 MG tablet; Take 1 tablet by mouth every 4 (four) hours as needed for moderate pain.  Dispense: 30 tablet; Refill: 0  3. Bilateral lower extremity edema Patient has increased edema to lower extremities, will review renal functioning and BNP as results become available.  Reviewed previous echocardiogram, she does not have a history of HF.  - Brain natriuretic peptide   RTC: 3 months for DMII and will follow up by phone to discuss hemoglobin  a1C results as they become available.    Nolon NationsLaChina Moore Haeden Hudock  MSN, FNP-C [facility] [address]  [place], [place] [id number] [phone]  The patient was given clear instructions to go to ER or return to medical center if symptoms do not improve, worsen or new problems develop. The patient verbalized understanding. Will notify patient with laboratory results.

## 2016-09-28 NOTE — Patient Instructions (Addendum)
Phone Dr. Lorin PicketScott Dean's office to schedule a follow up to discuss MRI.   Will continue Tylenol # 3 for right shoulder pain every 8 hour as needed for moderate to severe pain Edema Edema is when you have too much fluid in your body or under your skin. Edema may make your legs, feet, and ankles swell up. Swelling is also common in looser tissues, like around your eyes. This is a common condition. It gets more common as you get older. There are many possible causes of edema. Eating too much salt (sodium) and being on your feet or sitting for a long time can cause edema in your legs, feet, and ankles. Hot weather may make edema worse. Edema is usually painless. Your skin may look swollen or shiny. Follow these instructions at home:  Keep the swollen body part raised (elevated) above the level of your heart when you are sitting or lying down.  Do not sit still or stand for a long time.  Do not wear tight clothes. Do not wear garters on your upper legs.  Exercise your legs. This can help the swelling go down.  Wear elastic bandages or support stockings as told by your doctor.  Eat a low-salt (low-sodium) diet to reduce fluid as told by your doctor.  Depending on the cause of your swelling, you may need to limit how much fluid you drink (fluid restriction).  Take over-the-counter and prescription medicines only as told by your doctor. Contact a doctor if:  Treatment is not working.  You have heart, liver, or kidney disease and have symptoms of edema.  You have sudden and unexplained weight gain. Get help right away if:  You have shortness of breath or chest pain.  You cannot breathe when you lie down.  You have pain, redness, or warmth in the swollen areas.  You have heart, liver, or kidney disease and get edema all of a sudden.  You have a fever and your symptoms get worse all of a sudden. Summary  Edema is when you have too much fluid in your body or under your skin.  Edema may  make your legs, feet, and ankles swell up. Swelling is also common in looser tissues, like around your eyes.  Raise (elevate) the swollen body part above the level of your heart when you are sitting or lying down.  Follow your doctor's instructions about diet and how much fluid you can drink (fluid restriction). This information is not intended to replace advice given to you by your health care provider. Make sure you discuss any questions you have with your health care provider. Document Released: 08/12/2007 Document Revised: 03/13/2016 Document Reviewed: 03/13/2016 Elsevier Interactive Patient Education  2017 [employer]   Will follow up by phone with any abnormal laboratory results

## 2016-09-29 LAB — BRAIN NATRIURETIC PEPTIDE: Brain Natriuretic Peptide: 31.5 pg/mL (ref <100)

## 2016-10-01 ENCOUNTER — Telehealth: Payer: Self-pay

## 2016-10-01 NOTE — Telephone Encounter (Signed)
Patient returned call, I advised of kidney function being abnormal which is thought to be the reason she is having swelling in her extremities. Advised that she need to really focus on taking all medication as directed and trying to focus on getting diabetes in tighter control Advised that we are referring her to a diabetic educator that can help her with this as well. Patient verbalized understanding and was asked to keep next follow up here. Thanks!

## 2016-10-01 NOTE — Telephone Encounter (Signed)
-----   Message from Massie MaroonLachina M Hollis, [place] sent at 10/01/2016  5:21 AM EDT ----- Regarding: lab results Please inform Rachel Gonzalez that creatinine level, which is Gonzalez indicator of kidney functioning is increased and GFR is decreased. These findings are consistently with worsening chronic kidney disease. I suspect that lower extremity swelling is related to decreased kidney functioning. Will continue to monitor closely. Tighter control of type 2 diabetes is warranted. It is important to take antidiabetic medications consistently. Sent referral to diabetes education.   Follow up in office as scheduled.   Thanks ----- Message ----- From: Leory PlowmanInterface, [facility] [place] Sent: 09/28/2016  10:51 AM To: Massie MaroonLachina M Hollis, FNP

## 2016-10-01 NOTE — Telephone Encounter (Signed)
Called, no answer. Left message for patient to return call when available. Thanks!  

## 2016-10-14 ENCOUNTER — Other Ambulatory Visit: Payer: Self-pay | Admitting: Family Medicine

## 2016-10-14 DIAGNOSIS — E1140 Type 2 diabetes mellitus with diabetic neuropathy, unspecified: Secondary | ICD-10-CM

## 2016-11-03 ENCOUNTER — Other Ambulatory Visit: Payer: Self-pay | Admitting: Family Medicine

## 2016-11-03 DIAGNOSIS — Z794 Long term (current) use of insulin: Principal | ICD-10-CM

## 2016-11-03 DIAGNOSIS — IMO0002 Reserved for concepts with insufficient information to code with codable children: Secondary | ICD-10-CM

## 2016-11-03 DIAGNOSIS — E1165 Type 2 diabetes mellitus with hyperglycemia: Secondary | ICD-10-CM

## 2016-11-03 DIAGNOSIS — E118 Type 2 diabetes mellitus with unspecified complications: Principal | ICD-10-CM

## 2016-11-04 ENCOUNTER — Other Ambulatory Visit: Payer: Self-pay | Admitting: Family Medicine

## 2016-11-04 DIAGNOSIS — K219 Gastro-esophageal reflux disease without esophagitis: Secondary | ICD-10-CM

## 2016-11-19 ENCOUNTER — Telehealth: Payer: Self-pay

## 2016-11-19 NOTE — Telephone Encounter (Signed)
Called today at 9:15am. No answer left a message asking patient to call back. Thanks!

## 2016-11-19 NOTE — Telephone Encounter (Signed)
Called and spoke with patient. She wanted to inform me that should would not be by to pick up her handicap place-card form this week. I advised her that is fine and we would be closed tomorrow 11/20/2016 but as far as I know, we will re-open on Monday 11/23/2016 regular hours. Patient states understanding. Thanks!

## 2016-11-30 ENCOUNTER — Encounter: Payer: Self-pay | Admitting: Family Medicine

## 2016-11-30 ENCOUNTER — Telehealth: Payer: Self-pay

## 2016-11-30 ENCOUNTER — Other Ambulatory Visit: Payer: Self-pay | Admitting: Family Medicine

## 2016-11-30 ENCOUNTER — Ambulatory Visit (INDEPENDENT_AMBULATORY_CARE_PROVIDER_SITE_OTHER): Payer: Medicare Other | Admitting: Family Medicine

## 2016-11-30 VITALS — BP 134/64 | HR 74 | Temp 97.8°F | Resp 14 | Ht 64.0 in | Wt 231.0 lb

## 2016-11-30 DIAGNOSIS — I10 Essential (primary) hypertension: Secondary | ICD-10-CM | POA: Diagnosis not present

## 2016-11-30 DIAGNOSIS — G8929 Other chronic pain: Secondary | ICD-10-CM

## 2016-11-30 DIAGNOSIS — E118 Type 2 diabetes mellitus with unspecified complications: Secondary | ICD-10-CM | POA: Diagnosis not present

## 2016-11-30 DIAGNOSIS — Z794 Long term (current) use of insulin: Secondary | ICD-10-CM | POA: Diagnosis not present

## 2016-11-30 DIAGNOSIS — M25511 Pain in right shoulder: Secondary | ICD-10-CM | POA: Diagnosis not present

## 2016-11-30 DIAGNOSIS — E1165 Type 2 diabetes mellitus with hyperglycemia: Secondary | ICD-10-CM | POA: Diagnosis not present

## 2016-11-30 DIAGNOSIS — IMO0002 Reserved for concepts with insufficient information to code with codable children: Secondary | ICD-10-CM

## 2016-11-30 LAB — COMPLETE METABOLIC PANEL WITH GFR
AG RATIO: 1.2 (calc) (ref 1.0–2.5)
ALBUMIN MSPROF: 3.4 g/dL — AB (ref 3.6–5.1)
ALKALINE PHOSPHATASE (APISO): 163 U/L — AB (ref 33–130)
ALT: 10 U/L (ref 6–29)
AST: 9 U/L — ABNORMAL LOW (ref 10–35)
BILIRUBIN TOTAL: 0.2 mg/dL (ref 0.2–1.2)
BUN / CREAT RATIO: 20 (calc) (ref 6–22)
BUN: 39 mg/dL — ABNORMAL HIGH (ref 7–25)
CHLORIDE: 113 mmol/L — AB (ref 98–110)
CO2: 24 mmol/L (ref 20–32)
Calcium: 8.5 mg/dL — ABNORMAL LOW (ref 8.6–10.4)
Creat: 1.96 mg/dL — ABNORMAL HIGH (ref 0.50–0.99)
GFR, Est African American: 30 mL/min/{1.73_m2} — ABNORMAL LOW (ref >=60)
GFR, Est Non African American: 26 mL/min/{1.73_m2} — ABNORMAL LOW (ref >=60)
GLOBULIN: 2.8 g/dL (ref 1.9–3.7)
GLUCOSE: 79 mg/dL (ref 65–99)
POTASSIUM: 5 mmol/L (ref 3.5–5.3)
SODIUM: 142 mmol/L (ref 135–146)
TOTAL PROTEIN: 6.2 g/dL (ref 6.1–8.1)

## 2016-11-30 LAB — POCT GLYCOSYLATED HEMOGLOBIN (HGB A1C): Hemoglobin A1C: 7.4

## 2016-11-30 LAB — LIPID PANEL
Cholesterol: 120 mg/dL (ref <200)
HDL: 58 mg/dL (ref >50)
LDL Cholesterol (Calc): 48 mg/dL (calc)
NON-HDL CHOLESTEROL (CALC): 62 mg/dL (ref <130)
Total CHOL/HDL Ratio: 2.1 (calc) (ref <5.0)
Triglycerides: 67 mg/dL (ref <150)

## 2016-11-30 LAB — GLUCOSE, CAPILLARY: GLUCOSE-CAPILLARY: 83 mg/dL (ref 65–99)

## 2016-11-30 MED ORDER — INSULIN GLARGINE 100 UNIT/ML SOLOSTAR PEN: 20.0000 [IU] | PEN_INJECTOR | Freq: Every day | SUBCUTANEOUS | 3 refills | Status: DC

## 2016-11-30 MED ORDER — ACETAMINOPHEN-CODEINE #3 300-30 MG PO TABS: 1.0000 | ORAL_TABLET | ORAL | 0 refills | Status: DC | PRN

## 2016-11-30 NOTE — Telephone Encounter (Signed)
[place],  Please advise if Tylenol #3 can be refilled for patient. Please advise.

## 2016-11-30 NOTE — Progress Notes (Signed)
Subjective:    Patient ID: Rachel Gonzalez, female    DOB: Jun 28, 1950, 66 y.o.   MRN: [id number]  HPI  Rachel Gonzalez, a 66 year old female with a history of hypertension and diabetes mellitus type 2 presents for a follow up of diabetes mellitus.  She is not exercising due to chronic right shoulder pain and is not adherent to low salt diet. She does not check blood pressure at home. She endorses lower extremity edema for greater than 1 week.    Patient denies chest pain, chest pressure/discomfort, fatigue, irregular heart beat, near-syncope, palpitations and tachypnea.  Cardiovascular risk factors include: diabetes mellitus and sedentary lifestyle.    Patient also has a history of uncontrolled type 2 diabetes mellitus. She states that she has been taking medication  consistently. 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  She states that she has been using Lantus 35 units HS and sliding scale consistently. She did not bring glucometer on today  Patient denies foot ulcerations, increase appetite, nausea, paresthesia of the feet, polydipsia, polyuria, visual disturbances, vomitting and weight loss.    Past Medical History:  Diagnosis Date  . Anemia   . Diabetes mellitus   . Hypertension   . Renal disorder    Immunization History  Administered Date(s) Administered  . Pneumococcal Polysaccharide-23 02/12/2015  . Td 05/07/2014  . Tdap 11/21/2007   Allergies  Allergen Reactions  . Tramadol Nausea And Vomiting   Social History   Social History  . Marital status: Married    Spouse name: N/A  . Number of children: N/A  . Years of education: N/A   Occupational History  . Not on file.   Social History Main Topics  . Smoking status: Never Smoker  . Smokeless tobacco: Never Used  . Alcohol use No  . Drug use: No  . Sexual activity: Not on file   Other Topics Concern  . Not on file   Social History Narrative   From [place].   Married.   Three  children, 9 grandchildren.   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   Travels to [place], [address]      Review of Systems  Constitutional: Positive for unexpected weight change (weight gain). Negative for fever.  HENT: Negative.   Respiratory: Negative.   Cardiovascular: Negative.  Negative for chest pain, palpitations and leg swelling.  Gastrointestinal: Negative.   Endocrine: Negative for polydipsia, polyphagia and polyuria.  Genitourinary: Negative.  Negative for hematuria.  Musculoskeletal: Negative.  Myalgias: right foot pain/ ambulates with cane.  Skin: Negative.   Neurological: Negative.  Negative for dizziness, facial asymmetry, weakness, light-headedness and numbness.  Hematological: Negative.   Psychiatric/Behavioral: Negative.        Objective:   Physical Exam  Constitutional: She appears well-developed and well-nourished. She is active. She does not have a sickly appearance.  HENT:  Head: Normocephalic and atraumatic.  Nose: Mucosal edema present.  Eyes: Lids are normal.  Neck: Normal range of motion and full passive range of motion without pain. Neck supple.  Cardiovascular: Normal rate, regular rhythm, S1 normal, normal heart sounds and normal pulses.   Pulmonary/Chest: Effort normal and breath sounds normal.  Abdominal: Soft. Normal appearance and bowel sounds are normal. There is no tenderness.  Musculoskeletal:  Right great toe amputation  Lymphadenopathy:       Head (right side): No submental and no submandibular adenopathy present.  Head (left side): No submental and no submandibular adenopathy present.  Neurological: She is alert.  Skin: Skin is warm, dry and intact.  Psychiatric: Her speech is normal and behavior is normal. Judgment and thought content normal. Cognition and memory are normal.      BP 134/64 (BP Location: Left Arm, Patient Position: Sitting, Cuff Size: Large)   Pulse 74   Temp 97.8 F (36.6  C) (Oral)   Resp 14   Ht  (1.626 m)   Wt 231 lb (104.8 kg)   SpO2 98%   BMI 39.65 kg/m  Assessment & Plan:  1. Uncontrolled type 2 diabetes mellitus with complication, with long-term current use of insulin (HCC) Hemoglobin a1C has decreased from 11.9 to 7.4.  Will continue Lantus at 20 units at bedtime She says that she has difficulty with diabetic meal planning.  She was given an 1800 calorie carbohydrate modified diet plan.  Will also send a referral to diabetes education - Glucose, capillary - HgB A1c - Lipid Panel - COMPLETE METABOLIC PANEL WITH GFR - Ambulatory referral to diabetic education - Insulin Glargine (LANTUS SOLOSTAR) 100 UNIT/ML Solostar Pen; Inject 20 Units into the skin daily at 10 pm.  Dispense: 15 mL; Refill: 3 Your A1C goal is less than 7. Your fasting blood sugar  Upon awakening goal is between 110-140.  Your LDL  (bad cholesterol goal is less than 100 Blood pressure goal is <140/90.  Recommend a lowfat, low carbohydrate diet divided over 5-6 small meals, increase water intake to 6-8 glasses, and 150 minutes per week of cardiovascular exercise.   Take your medications as prescribed Make sure that you are familiar with each one of your medications and what they are used to treat.  If you are unsure of medications, please bring to follow up Will send referral for eye exam  Please keep your scheduled follow up appointment.   2. Essential hypertension Blood pressure is at goal on current medication regimen No changes warranted at this time  Continue medication, monitor blood pressure at home. Continue DASH diet. Reminder to go to the ER if any CP, SOB, nausea, dizziness, severe HA, changes vision/speech, left arm numbness and tingling and jaw pain. - Lipid Panel - COMPLETE METABOLIC PANEL WITH GFR   RTC: 3 months for DMII and hypertension  Nolon Nations  MSN, FNP-C [facility] [address] [place], [name] [address] [phone]

## 2016-11-30 NOTE — Patient Instructions (Addendum)
Type 2 diabetes mellitus: Recommend 1800 calorie diet and increase daily activity Your hemoglobin a1C has improved to 7.4, I am so proud of your progress.  If blood sugar is < 150, hold Novolog  Your A1C goal is less than 7. Your fasting blood sugar  Upon awakening goal is between 110-140.  Your LDL  (bad cholesterol goal is less than 100 Blood pressure goal is <140/90.  Recommend a lowfat, low carbohydrate diet divided over 5-6 small meals, increase water intake to 6-8 glasses, and 150 minutes per week of cardiovascular exercise.   Take your medications as prescribed Make sure that you are familiar with each one of your medications and what they are used to treat.  If you are unsure of medications, please bring to follow up Will send referral for eye exam  Please keep your scheduled follow up appointment.    Hypertension:  Blood pressure is at goal on current medication regimen.  No medication changes to anti hypertensive

## 2016-11-30 NOTE — Progress Notes (Signed)
Reviewed [place] Substance Reporting system prior to prescribing opiate medications. No inconsistencies noted.    Meds ordered this encounter  Medications  . acetaminophen-codeine (TYLENOL #3) 300-30 MG tablet    Sig: Take 1 tablet by mouth every 4 (four) hours as needed for moderate pain.    Dispense:  30 tablet    Refill:  0    Order Specific Question:   Supervising Provider    Answer:   Quentin Angst [[id number]]       Nolon Nations  MSN, FNP-C [facility] [address] [place], [name] [address] [phone]

## 2016-12-01 ENCOUNTER — Other Ambulatory Visit: Payer: Self-pay | Admitting: Family Medicine

## 2016-12-01 ENCOUNTER — Telehealth: Payer: Self-pay

## 2016-12-01 ENCOUNTER — Other Ambulatory Visit: Payer: Self-pay

## 2016-12-01 DIAGNOSIS — N183 Chronic kidney disease, stage 3 unspecified: Secondary | ICD-10-CM

## 2016-12-01 DIAGNOSIS — I10 Essential (primary) hypertension: Secondary | ICD-10-CM

## 2016-12-01 DIAGNOSIS — N184 Chronic kidney disease, stage 4 (severe): Secondary | ICD-10-CM | POA: Insufficient documentation

## 2016-12-01 MED ORDER — HYDROCHLOROTHIAZIDE 12.5 MG PO TABS: 12.5000 mg | ORAL_TABLET | Freq: Every day | ORAL | 3 refills | Status: DC

## 2016-12-01 NOTE — Telephone Encounter (Signed)
Patient returned call. I advised patient that we are decreasing the hctz to 12.5mg  once daily and that we are sending her to nephrology since the kidney function is worsening. Patient verbalized understanding. Thanks!

## 2016-12-01 NOTE — Telephone Encounter (Signed)
Called, no answer left a message for patient to call back. Also need to advise patient that we are referring her to nephrology. Thanks!

## 2016-12-01 NOTE — Telephone Encounter (Signed)
-----   Message from Massie Maroon, [place] sent at 12/01/2016  6:49 AM EDT ----- Will not add furosemide on Rachel Gonzalez, but will decrease hydrochlorothiazide to 12.5 mg daily. I apologize.   Thanks

## 2016-12-10 ENCOUNTER — Ambulatory Visit (INDEPENDENT_AMBULATORY_CARE_PROVIDER_SITE_OTHER): Payer: Medicare Other | Admitting: Orthopedic Surgery

## 2016-12-10 ENCOUNTER — Encounter (INDEPENDENT_AMBULATORY_CARE_PROVIDER_SITE_OTHER): Payer: Self-pay | Admitting: Orthopedic Surgery

## 2016-12-10 DIAGNOSIS — M5020 Other cervical disc displacement, unspecified cervical region: Secondary | ICD-10-CM

## 2016-12-10 DIAGNOSIS — M542 Cervicalgia: Secondary | ICD-10-CM | POA: Diagnosis not present

## 2016-12-10 DIAGNOSIS — Z6841 Body Mass Index (BMI) 40.0 and over, adult: Secondary | ICD-10-CM | POA: Diagnosis not present

## 2016-12-10 DIAGNOSIS — N183 Chronic kidney disease, stage 3 (moderate): Secondary | ICD-10-CM | POA: Diagnosis not present

## 2016-12-10 DIAGNOSIS — D631 Anemia in chronic kidney disease: Secondary | ICD-10-CM | POA: Diagnosis not present

## 2016-12-10 DIAGNOSIS — E1129 Type 2 diabetes mellitus with other diabetic kidney complication: Secondary | ICD-10-CM | POA: Diagnosis not present

## 2016-12-10 DIAGNOSIS — I129 Hypertensive chronic kidney disease with stage 1 through stage 4 chronic kidney disease, or unspecified chronic kidney disease: Secondary | ICD-10-CM | POA: Diagnosis not present

## 2016-12-10 DIAGNOSIS — R809 Proteinuria, unspecified: Secondary | ICD-10-CM | POA: Diagnosis not present

## 2016-12-12 NOTE — Progress Notes (Signed)
Office Visit Note   Patient: Rachel Gonzalez           Date of Birth: 1951-02-20           MRN: [id number] Visit Date: 12/10/2016 Requested by: Massie Maroon, FNP [address] [place], [name] [address] PCP: Massie Maroon, FNP  Subjective: Chief Complaint  Patient presents with  . Right Shoulder - Pain    HPI: Rachel Gonzalez is a 66 year old patient with right shoulder pain.  Denies any history of injury.  Reports stiffness and pain which wakes her from sleep at night.  The pain does radiate down her arm.  Here to follow-up MRI scan of her neck.  The scan is reviewed and it shows C4-5 protrusion with right foraminal encroachment.  I think this is causing at least some of her right shoulder pain.              ROS: All systems reviewed are negative as they relate to the chief complaint within the history of present illness.  Patient denies  fevers or chills.   Assessment & Plan: Visit Diagnoses:  1. Protrusion of cervical intervertebral disc   2. Cervicalgia     Plan: Impression is right sided C4-5 disc protrusion causing what is likely right-sided shoulder and arm pain.  I like for her to go see Dr. Alvester Morin for consult and epidural steroid injection in her neck to see what proportion of her pain is likely coming from this neck related pathology.  I'll see her back as needed.  Follow-Up Instructions: No Follow-up on file.   Orders:  Orders Placed This Encounter  Procedures  . Ambulatory referral to Physical Medicine Rehab   No orders of the defined types were placed in this encounter.     Procedures: No procedures performed   Clinical Data: No additional findings.  Objective: Vital Signs: There were no vitals taken for this visit.  Physical Exam:   Constitutional: Patient appears well-developed HEENT:  Head: Normocephalic Eyes:EOM are normal Neck: Normal range of motion Cardiovascular: Normal rate Pulmonary/chest: Effort normal Neurologic: Patient is  alert Skin: Skin is warm Psychiatric: Patient has normal mood and affect    Ortho Exam: Orthopedic exam demonstrates pretty reasonable cervical spine range of motion is some pain with rotation to the right.  Shoulder range of motion is full with no restriction of external rotation at 15 abduction.  No other masses lymph adenopathy or skin changes noted in the right shoulder region.  No course grinding or crepitus is noted.  No real weakness with EPL FPL interosseous wrist flexion-extension biceps triceps and deltoid strength testing   Specialty Comments:  No specialty comments available.  Imaging: No results found.   PMFS History: Patient Active Problem List   Diagnosis Date Noted  . Stage 3 chronic kidney disease (HCC) 12/01/2016  . Frequent falls 04/28/2016  . Right arm weakness 04/28/2016  . Bilateral lower extremity edema 04/28/2016  . Screening for colon cancer 04/28/2016  . Abnormal urinalysis 09/13/2014  . Chest pain 09/13/2014  . Abdominal pain, recurrent 09/13/2014  . Dehydration with hyponatremia 09/13/2014  . Acute hyperkalemia 09/13/2014  . Acute renal failure superimposed on stage 3 chronic kidney disease (HCC) 09/13/2014  . Anemia 05/06/2014  . Diabetic neuropathy, type II diabetes mellitus (HCC) 05/06/2014  . Obesity (BMI 30-39.9) 05/06/2014  . Cellulitis of right foot 05/06/2014  . Leg swelling 03/31/2011  . Essential hypertension 03/07/2011  . PERS HX NONCOMPLIANCE W/MED TX  PRS HAZARDS HLTH 02/07/2009  . Right arm pain 08/04/2006  . Osteoarthritis 02/14/2006  . Diabetes mellitus type 2, uncontrolled (HCC) 01/11/2006   Past Medical History:  Diagnosis Date  . Anemia   . Diabetes mellitus   . Hypertension   . Renal disorder     Family History  Problem Relation Age of Onset  . Diabetes Mother     Past Surgical History:  Procedure Laterality Date  . AMPUTATION Right 05/08/2014   Procedure: AMPUTATION RAY, right great toe;  Surgeon: Nadara Mustard, MD;   Location: [facility] OR;  Service: Orthopedics;  Laterality: Right;  . arm surgery     . CESAREAN SECTION    . I&D EXTREMITY Right 05/08/2014   Procedure: IRRIGATION AND DEBRIDEMENT FOOT;  Surgeon: Nadara Mustard, MD;  Location: [facility] OR;  Service: Orthopedics;  Laterality: Right;   Social History   Occupational History  . Not on file.   Social History Main Topics  . Smoking status: Never Smoker  . Smokeless tobacco: Never Used  . Alcohol use No  . Drug use: No  . Sexual activity: Not on file

## 2016-12-22 ENCOUNTER — Other Ambulatory Visit (HOSPITAL_COMMUNITY): Payer: Self-pay

## 2016-12-23 ENCOUNTER — Encounter (HOSPITAL_COMMUNITY)
Admission: RE | Admit: 2016-12-23 | Discharge: 2016-12-23 | Disposition: A | Discharge status: Home / Self Care | Payer: Medicare Other | Source: Ambulatory Visit | Attending: Nephrology | Admitting: Nephrology

## 2016-12-23 DIAGNOSIS — N189 Chronic kidney disease, unspecified: Secondary | ICD-10-CM | POA: Diagnosis not present

## 2016-12-23 DIAGNOSIS — D631 Anemia in chronic kidney disease: Secondary | ICD-10-CM | POA: Diagnosis not present

## 2016-12-23 MED ORDER — SODIUM CHLORIDE 0.9 % IV SOLN
510.0000 mg | INTRAVENOUS | Status: DC
  Administered 2016-12-23: 510 mg via INTRAVENOUS
  Filled 2016-12-23: qty 17

## 2016-12-23 NOTE — Discharge Instructions (Signed)

## 2016-12-24 ENCOUNTER — Encounter (HOSPITAL_COMMUNITY): Payer: Medicaid Other

## 2016-12-28 ENCOUNTER — Other Ambulatory Visit: Payer: Self-pay

## 2016-12-28 DIAGNOSIS — M25511 Pain in right shoulder: Principal | ICD-10-CM

## 2016-12-28 DIAGNOSIS — G8929 Other chronic pain: Secondary | ICD-10-CM

## 2016-12-29 ENCOUNTER — Ambulatory Visit (INDEPENDENT_AMBULATORY_CARE_PROVIDER_SITE_OTHER): Payer: Medicare Other | Admitting: Physical Medicine and Rehabilitation

## 2016-12-29 ENCOUNTER — Encounter (INDEPENDENT_AMBULATORY_CARE_PROVIDER_SITE_OTHER): Payer: Self-pay | Admitting: Physical Medicine and Rehabilitation

## 2016-12-29 VITALS — BP 175/91 | HR 75

## 2016-12-29 DIAGNOSIS — G8929 Other chronic pain: Secondary | ICD-10-CM

## 2016-12-29 DIAGNOSIS — M5412 Radiculopathy, cervical region: Secondary | ICD-10-CM

## 2016-12-29 DIAGNOSIS — M25511 Pain in right shoulder: Secondary | ICD-10-CM

## 2016-12-29 DIAGNOSIS — M609 Myositis, unspecified: Secondary | ICD-10-CM | POA: Diagnosis not present

## 2016-12-29 DIAGNOSIS — M4802 Spinal stenosis, cervical region: Secondary | ICD-10-CM | POA: Diagnosis not present

## 2016-12-29 NOTE — Progress Notes (Deleted)
Right sided neck and arm pain radiating into third and fourth fingers. Numbness in fingers also. Pain increases with grasping, cooking. Pain also increases when lying on left side. Extra gababpentin at night, PT note to August Saucerean and PCP

## 2016-12-29 NOTE — Progress Notes (Signed)
Rachel Gonzalez - 66 y.o. female MRN [id number]  Date of birth: 1950-05-15  Office Visit Note: Visit Date: 12/29/2016 PCP: Patient, No Pcp Per Referred by: Massie MaroonHollis, Lachina M, FNP  Subjective: Chief Complaint  Patient presents with  . Neck - Pain  . Right Shoulder - Pain  . Right Hand - Pain, Numbness   HPI: Rachel Gonzalez is a 66 year old right-hand-dominant female referred for treatment by Dr. August Saucerean in our office.  Rachel Gonzalez saw Dr. August Saucerean in May of this year with neck shoulder and arm pain.  Basically her pain complaints are right-sided neck pain that Rachel Gonzalez states will refer down the arm feels like her pain increases with grasping and cooking also has increased pain with lying on the left side.  Dr. August Saucerean evaluated her and felt like this was a cervical issue than her shoulder.  He did obtain an MRI of the cervical spine and this is reviewed below.  This really showed an area C4-5 with a small central protrusion with some anterior flattening of the real significant stenosis.  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  There was no compression at that level foraminal or central.  Dr. August Saucerean did feel like this could be a source of her symptoms.  Speaking with her today really concerned about the fact that the knuckles and the 2 digits middle of the right hand have difficulty moving.  Rachel Gonzalez feels like there is swollen in the knuckles are having issues.  Rachel Gonzalez reports to me that Rachel Gonzalez has had the same pain for over 6 years.  Rachel Gonzalez reports that the pain in the fingers is may be more like 2 years.  Rachel Gonzalez is currently on gabapentin and does have diabetes.  Rachel Gonzalez is on 300 mg of gabapentin 3 times per day.  Rachel Gonzalez asked about Tylenol 3 stating that it has helped.  Rachel Gonzalez has had no prior cervical surgery.  Rachel Gonzalez has had no prior physical therapy or chiropractic care.  According to Dr. Diamantina Providenceean's note in May he referred her for but Rachel Gonzalez reports that Rachel Gonzalez has not had any physical therapy in 6 years.  He does report generalized  weakness.  Rachel Gonzalez is very ginger with using the right arm.  Her last hemoglobin A1c was 7.4.  Rachel Gonzalez reports never having seen a neurologist studies.    Review of Systems  Constitutional: Negative for chills, fever, malaise/fatigue and weight loss.  HENT: Negative for hearing loss and sinus pain.   Eyes: Negative for blurred vision, double vision and photophobia.  Respiratory: Negative for cough and shortness of breath.   Cardiovascular: Negative for chest pain, palpitations and leg swelling.  Gastrointestinal: Negative for abdominal pain, nausea and vomiting.  Genitourinary: Negative for flank pain.  Musculoskeletal: Positive for joint pain and neck pain. Negative for myalgias.  Skin: Negative for itching and rash.  Neurological: Positive for tingling. Negative for tremors, focal weakness and weakness.  Endo/Heme/Allergies: Negative.   Psychiatric/Behavioral: Negative for depression.  All other systems reviewed and are negative.  Otherwise per HPI.  Assessment & Plan: Visit Diagnoses:  1. Cervical radiculopathy   2. Spinal stenosis of cervical region   3. Chronic right shoulder pain   4. Myofascitis     Plan: Findings:  Chronic 6-year history of neck and shoulder pain and referral pain.  Rachel Gonzalez does have MRI findings of central protrusion at C4-5 without any real compression.  This could cause a generalized neck and shoulder pain should not refer pain to  the C7 distribution.  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  Rachel Gonzalez does have some myofascial pain.  I think the next day but Rachel Gonzalez really was reluctant to even think about injections would be to have her see a physical therapist for this cervical sure this message to get sent to Dr. August Saucer.  We can see her in the future for epidural injection as a diagnostic test but her symptoms really do not go along with the imaging at this point.  He may wish to refer her to a neurologist for  electrodiagnostic study as well.  I do want her to increase her gabapentin by 1 capsule at night.  He takes this and this would help if this is more of a neurogenic pain.  In terms of potential for the epidural injection to help with the limited with a 6-year history of pain.  I do not feel Rachel Gonzalez would be a great candidate for chronic opioid treatment.  Some Tylenol 3 from her primary care physician which I think can be appropriate when it is flared up.    Meds & Orders: No orders of the defined types were placed in this encounter.   Orders Placed This Encounter  Procedures  . Ambulatory referral to Physical Therapy    Follow-up: Return if symptoms worsen or fail to improve, for Dr. August Saucer.   Procedures: No procedures performed  No notes on file   Clinical History: MRI CERVICAL SPINE WITHOUT CONTRAST  TECHNIQUE: Multiplanar, multisequence MR imaging of the cervical spine was performed. No intravenous contrast was administered.  COMPARISON:  None.  FINDINGS: Alignment: Reversal of the normal cervical lordotic curve. This could be positional or due to spasm. No subluxation.  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 No worrisome osseous lesion.  Cord: Minimal flattening at C4-5.  No abnormal cord signal.  Posterior Fossa, vertebral arteries, paraspinal tissues: Unremarkable. Kissing carotid arteries in the midline retropharynx.  Disc levels:  C2-3:  Normal.  C3-4:  Annular bulge.  No impingement.  C4-5: Central protrusion. Effacement anterior subarachnoid space with minimal flattening. BILATERAL C5 foraminal narrowing.  C5-6:  Annular bulge.  No impingement.  C6-7:  Hypoplastic.  C7-T1:  Normal.  IMPRESSION: Central protrusion at C4-5. BILATERAL C5 foraminal narrowing. Correlate clinically for RIGHT arm radicular symptoms. Mild stenosis and minimal cord flattening without abnormal cord  signal.  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   Electronically Signed   By: Elsie Stain M.D.   On: 09/21/2016 14:51  Rachel Gonzalez reports that Rachel Gonzalez has never smoked. Rachel Gonzalez has never used smokeless tobacco.   Recent Labs  07/27/16 1107 08/27/16 0952 11/30/16 1113  HGBA1C 11.5 11.9 7.4    Objective:  VS:  HT:    WT:   BMI:     BP:(!) 175/91  HR:75bpm  TEMP: ( )  RESP:  Physical Exam  Constitutional: Rachel Gonzalez is oriented to person, place, and time. Rachel Gonzalez appears well-developed and well-nourished. No distress.  HENT:  Head: Normocephalic and atraumatic.  Nose: Nose normal.  Mouth/Throat: Oropharynx is clear and moist.  Eyes: Pupils are equal, round, and reactive to light. Conjunctivae are normal.  Neck: Neck supple. No tracheal deviation present.  Cardiovascular: Regular rhythm and intact distal pulses.   Pulmonary/Chest: Effort normal. No respiratory distress.  Abdominal: Rachel Gonzalez exhibits no distension. There is no guarding.  Musculoskeletal:  Cervical  spine range of motion is limited by pain in all directions but worse with right rotation and forward flexion.  There are active trigger points noted in the levator scapula, supraspinatus and rhomboids.  Shoulder range of motion is limited on the right with impingement.  There is a negative Spurling's test bilaterally.  Patient with poor effort has 5 / 5 strength in all muscle groups of the upper extremities bilaterally without deficits.  The patient has 2+ muscle stretch reflexes at the biceps and brachioradialis bilaterally.  The patient has a negative Hoffman's test bilaterally.     Lymphadenopathy:    Rachel Gonzalez has no cervical adenopathy.  Neurological: Rachel Gonzalez is alert and oriented to person, place, and time. Rachel Gonzalez exhibits normal muscle tone. Coordination normal.  Skin: Skin is warm. No rash noted. No erythema.  Psychiatric: Rachel Gonzalez has a normal mood and affect. Her behavior is normal.  Nursing note and vitals  reviewed.   Ortho Exam Imaging: No results found.  Past Medical/Family/Surgical/Social History: Medications & Allergies reviewed per EMR Patient Active Problem List   Diagnosis Date Noted  . Stage 3 chronic kidney disease (HCC) 12/01/2016  . Frequent falls 04/28/2016  . Right arm weakness 04/28/2016  . Bilateral lower extremity edema 04/28/2016  . Screening for colon cancer 04/28/2016  . Abnormal urinalysis 09/13/2014  . Chest pain 09/13/2014  . Abdominal pain, recurrent 09/13/2014  . Dehydration with hyponatremia 09/13/2014  . Acute hyperkalemia 09/13/2014  . Acute renal failure superimposed on stage 3 chronic kidney disease (HCC) 09/13/2014  . Anemia 05/06/2014  . Diabetic neuropathy, type II diabetes mellitus (HCC) 05/06/2014  . Obesity (BMI 30-39.9) 05/06/2014  . Cellulitis of right foot 05/06/2014  . Leg swelling 03/31/2011  . Essential hypertension 03/07/2011  . 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 Right arm pain 08/04/2006  . Osteoarthritis 02/14/2006  . Diabetes mellitus type 2, uncontrolled (HCC) 01/11/2006   Past Medical History:  Diagnosis Date  . Anemia   . Diabetes mellitus   . Hypertension   . Renal disorder    Family History  Problem Relation Age of Onset  . Diabetes Mother    Past Surgical History:  Procedure Laterality Date  . AMPUTATION Right 05/08/2014   Procedure: AMPUTATION RAY, right great toe;  Surgeon: Nadara Mustard, MD;  Location: [facility] OR;  Service: Orthopedics;  Laterality: Right;  . arm surgery     . CESAREAN SECTION    . I&D EXTREMITY Right 05/08/2014   Procedure: IRRIGATION AND DEBRIDEMENT FOOT;  Surgeon: Nadara Mustard, MD;  Location: [facility] OR;  Service: Orthopedics;  Laterality: Right;   Social History   Occupational History  . Not on file.   Social History Main Topics  . Smoking status: Never Smoker  . Smokeless tobacco: Never Used  . Alcohol use No  . Drug use: No  . Sexual activity: Not on file

## 2016-12-30 ENCOUNTER — Ambulatory Visit (HOSPITAL_COMMUNITY)
Admission: RE | Admit: 2016-12-30 | Discharge: 2016-12-30 | Disposition: A | Discharge status: Home / Self Care | Payer: Medicare Other | Source: Ambulatory Visit | Attending: Nephrology | Admitting: Nephrology

## 2016-12-30 DIAGNOSIS — D631 Anemia in chronic kidney disease: Secondary | ICD-10-CM | POA: Diagnosis not present

## 2016-12-30 DIAGNOSIS — N189 Chronic kidney disease, unspecified: Secondary | ICD-10-CM | POA: Insufficient documentation

## 2016-12-30 MED ORDER — SODIUM CHLORIDE 0.9 % IV SOLN
510.0000 mg | INTRAVENOUS | Status: DC
  Administered 2016-12-30: 510 mg via INTRAVENOUS
  Filled 2016-12-30: qty 17

## 2017-01-07 ENCOUNTER — Telehealth: Payer: Self-pay

## 2017-01-07 NOTE — Telephone Encounter (Signed)
Patient called and is requesting a refill for Tylenol #3. Please advise. Thanks!

## 2017-01-08 ENCOUNTER — Other Ambulatory Visit: Payer: Self-pay | Admitting: Family Medicine

## 2017-01-08 DIAGNOSIS — G8929 Other chronic pain: Secondary | ICD-10-CM

## 2017-01-08 DIAGNOSIS — M25511 Pain in right shoulder: Principal | ICD-10-CM

## 2017-01-08 MED ORDER — ACETAMINOPHEN-CODEINE #3 300-30 MG PO TABS: 1.0000 | ORAL_TABLET | Freq: Four times a day (QID) | ORAL | 0 refills | Status: DC | PRN

## 2017-01-08 NOTE — Progress Notes (Signed)
Reviewed [place] Substance Reporting system prior to prescribing opiate medications. No inconsistencies noted.   Meds ordered this encounter  Medications  . acetaminophen-codeine (TYLENOL #3) 300-30 MG tablet    Sig: Take 1 tablet by mouth every 6 (six) hours as needed for moderate pain.    Dispense:  30 tablet    Refill:  0    Order Specific Question:   Supervising Provider    Answer:   JEGEDE, OLUGBEMIGA E [[id number]]    Rachel Gonzalez Rachel Gordner  MSN, FNP-C [facility] [address]  [place], [place] [address] [phone]  

## 2017-01-12 ENCOUNTER — Encounter: Payer: Self-pay | Admitting: Physical Therapy

## 2017-01-12 ENCOUNTER — Ambulatory Visit: Payer: Medicare Other | Attending: Physical Medicine and Rehabilitation | Admitting: Physical Therapy

## 2017-01-12 DIAGNOSIS — M5412 Radiculopathy, cervical region: Secondary | ICD-10-CM | POA: Diagnosis not present

## 2017-01-12 DIAGNOSIS — M791 Myalgia, unspecified site: Secondary | ICD-10-CM

## 2017-01-12 DIAGNOSIS — R208 Other disturbances of skin sensation: Secondary | ICD-10-CM | POA: Diagnosis not present

## 2017-01-12 DIAGNOSIS — M542 Cervicalgia: Secondary | ICD-10-CM | POA: Diagnosis not present

## 2017-01-12 DIAGNOSIS — R809 Proteinuria, unspecified: Secondary | ICD-10-CM | POA: Diagnosis not present

## 2017-01-12 DIAGNOSIS — Z6841 Body Mass Index (BMI) 40.0 and over, adult: Secondary | ICD-10-CM | POA: Diagnosis not present

## 2017-01-12 DIAGNOSIS — N183 Chronic kidney disease, stage 3 (moderate): Secondary | ICD-10-CM | POA: Diagnosis not present

## 2017-01-12 DIAGNOSIS — M79621 Pain in right upper arm: Secondary | ICD-10-CM

## 2017-01-12 DIAGNOSIS — D631 Anemia in chronic kidney disease: Secondary | ICD-10-CM | POA: Diagnosis not present

## 2017-01-12 DIAGNOSIS — I129 Hypertensive chronic kidney disease with stage 1 through stage 4 chronic kidney disease, or unspecified chronic kidney disease: Secondary | ICD-10-CM | POA: Diagnosis not present

## 2017-01-12 DIAGNOSIS — E1129 Type 2 diabetes mellitus with other diabetic kidney complication: Secondary | ICD-10-CM | POA: Diagnosis not present

## 2017-01-12 NOTE — Therapy (Signed)
[facility] [address] [place], [name], [address] Phone: [phone]   Fax:  [phone]  Physical Therapy Evaluation  Patient Details  Name: Rachel Gonzalez MRN: [id number] Date of Birth: 1950-10-08 Referring Provider: Dr. Ronne Binning    Encounter Date: 01/12/2017  PT End of Session - 01/12/17 2130    Visit Number  1    Number of Visits  16    Date for PT Re-Evaluation  03/09/17    PT Start Time  1505    PT Stop Time  1550    PT Time Calculation (min)  45 min    Activity Tolerance  Patient limited by pain    Behavior During Therapy  [facility] for tasks assessed/performed       Past Medical History:  Diagnosis Date  . Anemia   . Diabetes mellitus   . Hypertension   . Renal disorder     Past Surgical History:  Procedure Laterality Date  . arm surgery     . CESAREAN SECTION      There were no vitals filed for this visit.   Subjective Assessment - 01/12/17 1514    Subjective  Patient reports chronic pain in Rt. shoulder which extends into her fingers.  She has swelling in Rt. hand and finds it difficult to extend fingers.   She has R sided lateral neck pain and stiffness at times.  She does not move her Rt. arm with mobility. Walks with a cane but did not bring it today. She has weakness and trouble holding a cup or coffee.  Finds it hard to open things in the kitchen. and use Rt. arm for ADLs, sleeping.     Pertinent History  diabetes, HTN, renal, anemia     Limitations  Other (comment);Lifting;Writing;House hold activities    Diagnostic tests  MRI 09/2016 Dr. August Saucer:  C4-C5 central canal and bilateral C5 foraminal stenosis correlate with symptoms.     Patient Stated Goals  Pt would like to have less pain    Currently in Pain?  Yes    Pain Score  8     Pain Location  Neck    Pain Orientation  Right    Pain Descriptors / Indicators  Sore;Aching;Other (Comment);Throbbing earache   earache   Pain Type  Chronic pain     Pain Radiating Towards  arm and hand     Pain Onset  More than a month ago    Pain Frequency  Intermittent    Aggravating Factors   using her arm, ADLs    Pain Relieving Factors  meds, heat may have worsened.  not much relief at all. has to lay on her Rt. side to ease the pain and support it         [facility] PT Assessment - 01/12/17 0001      Assessment   Medical Diagnosis  cervical myofascitis    Referring Provider  Dr. Ronne Binning     Onset Date/Surgical Date  -- 5 yrs    5 yrs    Hand Dominance  Right    Next MD Visit  unknown    Prior Therapy  No       Precautions   Precautions  None      Restrictions   Weight Bearing Restrictions  No      Balance Screen   Has the patient fallen in the past 6 months  Yes    How many times?  2 unsure  of how, why.  legs gave out   unsure of how, why.  legs gave out   Has the patient had a decrease in activity level because of a fear of falling?   Yes    Is the patient reluctant to leave their home because of a fear of falling?   Yes      Home Environment   Living Environment  Private residence    Living Arrangements  Spouse/significant other    Available Help at Discharge  Family    Type of Home  House    Home Access  Stairs to enter    Entrance Stairs-Number of Steps  4    Entrance Stairs-Rails  Right    Home Layout  One level    Home Equipment  [place] - single point      Prior Function   Level of Independence  Independent    Vocation  Retired    Scientist, research (medical)Vocation Requirements  Hairstylist, retail     Leisure  nothing really lately      Cognition   Overall Cognitive Status  Within Functional Limits for tasks assessed      Observation/Other Assessments   Focus on Therapeutic Outcomes (FOTO)   NT       Observation/Other Assessments-Edema    Edema  -- not measured but visually inspected,, dig 3-4 lackROM, puffy   not measured but visually inspected,, dig 3-4 lackROM, puffy     Sensation   Light Touch  Appears Intact     Additional Comments  tingling in digits 3-4       Coordination   Gross Motor Movements are Fluid and Coordinated  Not tested      Posture/Postural Control   Posture/Postural Control  Postural limitations    Postural Limitations  Rounded Shoulders;Forward head;Increased thoracic kyphosis      AROM   Right Shoulder Extension  20 Degrees    Right Shoulder Flexion  30 Degrees    Right Shoulder ABduction  20 Degrees    Right Shoulder Internal Rotation  20 Degrees Unable to FR    Unable to FR    Right Shoulder External Rotation  15 Degrees unable to ER    unable to ER    Left Shoulder Flexion  125 Degrees    Cervical Flexion  34    Cervical Extension  25 pain R    pain R    Cervical - Right Side Bend  35    Cervical - Left Side Bend  30    Cervical - Right Rotation  50    Cervical - Left Rotation  65      Strength   Right Shoulder Flexion  2+/5    Right Shoulder ABduction  3/5    Right Shoulder Internal Rotation  3/5    Right Shoulder External Rotation  3/5    Right Elbow Flexion  3+/5    Left Elbow Flexion  4/5      Palpation   Spinal mobility  not tolerated     Palpation comment  not well tolerated grossly throughout Rt. lateral  neck and into cervicals, lateral, upper trap, swelling R pocket       Spurling's   Findings  Negative    Comment  pressure in neck bilaterally       Distraction Test   Findngs  Positive    side  Right    Comment  relief of Rt. arm pain      Ambulation/Gait  Ambulation Distance (Feet)  75 Feet    Assistive device  None    Gait Pattern  Step-to pattern;Decreased step length - right;Decreased step length - left;Antalgic;Narrow base of support    Ambulation Surface  Level;Indoor    Gait Comments  decr arm swing              Objective measurements completed on examination: See above findings.      [facility] Adult PT Treatment/Exercise - 01/12/17 0001      Modalities   Modalities  Cryotherapy      Cryotherapy   Number Minutes  Cryotherapy  8 Minutes    Cryotherapy Location  Shoulder;Cervical    Type of Cryotherapy  Ice pack      Manual Therapy   Manual Therapy  Myofascial release;Passive ROM;Manual Traction;Neural Stretch    Myofascial Release  neck     Passive ROM  rotation     Manual Traction  cervical spine gentle neutral and L sidebnd     Neural Stretch  Rt. UE nerve glides              PT Education - 01/12/17 2128    Education provided  Yes    Education Details  POC, PT, nerve anatomy, eval findings    Person(s) Educated  Patient    Methods  Tactile cues;Explanation;Demonstration    Comprehension  Verbalized understanding;Returned demonstration       PT Short Term Goals - 01/12/17 2131      PT SHORT TERM GOAL #1   Title  Pt will be I with HEP for cervical ROM, UE AROM    Time  4    Period  Weeks    Status  New    Target Date  02/09/17      PT SHORT TERM GOAL #2   Title  Pt will be able to use Rt. UE for a portion of ADLs with mod pain (5/10)    Time  4    Period  Weeks    Status  New    Target Date  02/09/17      PT SHORT TERM GOAL #3   Title  Pt will complete Balance Screen and set goal, if appropriate    Time  4    Period  Weeks    Status  New    Target Date  02/09/17      PT SHORT TERM GOAL #4   Title  Pt will better understand posture and body mechanics for lifting, sitting to preserve neck pain, UE function.     Time  4    Period  Weeks    Status  New    Target Date  02/09/17        PT Long Term Goals - 01/12/17 0600      PT LONG TERM GOAL #1   Title  Pt will be able to improve ability to turn head >60 deg in each direction for safer driving.     Time  8    Period  Weeks    Status  New    Target Date  03/09/17      PT LONG TERM GOAL #2   Title  Pt will be able to reach Rt. UE to shoulder height for improved functional mobility in the home, kitchen and with ADLs.     Time  8    Period  Weeks    Status  New    Target Date  03/09/17  PT LONG TERM GOAL  #3   Title  Pt will be able to sleep on L side for short periods at night and pain improved 25%     Time  8    Period  Weeks    Status  New    Target Date  03/09/17      PT LONG TERM GOAL #4   Title  Pt will be able to read, look down with less pain in neck and Rt arm (<15 min, pain <5/10)     Time  8    Period  Weeks    Status  New    Target Date  03/09/17             Plan - Jan 25, 2017 07/28/2136    Clinical Impression Statement  Pt presents for low complexity eval of 5-6 yr history of neck and Rt. arm pain. Symptoms correlate with MRI findings.  In additiion to neck and Rt. UE pain she has gait instability, with 2 falls last week of unknown cause.  Manual distraction of cervical spine did reduce Rt UE symptoms.  She has decreased fine motor and grip, adverse neural tension along C5.     Clinical Presentation  Stable    Clinical Decision Making  Low    Rehab Potential  Good    PT Frequency  2x / week    PT Duration  8 weeks    PT Treatment/Interventions  ADLs/Self Care Home Management;Gait training;Neuromuscular re-education;Passive range of motion;Manual techniques;Dry needling;Patient/family education;Functional mobility training;Traction;Ultrasound;Cryotherapy;Microbiologist;Therapeutic exercise    PT Next Visit Plan  give HEP (gentle cervial ROM and stab, gentle nerve glides in hooklying.) , UE ranger for Rt. UE , consider manual/mech traction and ice/heat post    PT Home Exercise Plan  unable on eval due to time constraints     Consulted and Agree with Plan of Care  Patient       Patient will benefit from skilled therapeutic intervention in order to improve the following deficits and impairments:  Abnormal gait, Increased fascial restricitons, Impaired sensation, Pain, Decreased mobility, Postural dysfunction, Impaired UE functional use, Decreased strength, Decreased range of motion, Impaired flexibility, Increased edema, Decreased balance, Decreased  activity tolerance  Visit Diagnosis: Cervicalgia  Pain in right upper arm  Other disturbances of skin sensation  Myalgia  Radiculopathy, cervical region  G-Codes - Jan 25, 2017 2153/07/28    Functional Assessment Tool Used (Outpatient Only)  clincial judgment    Functional Limitation  Carrying, moving and handling objects    Carrying, Moving and Handling Objects Current Status (N6295)  At least 80 percent but less than 100 percent impaired, limited or restricted    Carrying, Moving and Handling Objects Goal Status (M8413)  At least 60 percent but less than 80 percent impaired, limited or restricted        Problem List Patient Active Problem List   Diagnosis Date Noted  . Stage 3 chronic kidney disease (HCC) 12/01/2016  . Frequent falls 04/28/2016  . Right arm weakness 04/28/2016  . Bilateral lower extremity edema 04/28/2016  . Screening for colon cancer 04/28/2016  . Abnormal urinalysis 09/13/2014  . Chest pain 09/13/2014  . Abdominal pain, recurrent 09/13/2014  . Dehydration with hyponatremia 09/13/2014  . Acute hyperkalemia 09/13/2014  . Acute renal failure superimposed on stage 3 chronic kidney disease (HCC) 09/13/2014  . Anemia 05/06/2014  . Diabetic neuropathy, type II diabetes mellitus (HCC) 05/06/2014  . Obesity (BMI 30-39.9) 05/06/2014  . Cellulitis of right  foot 05/06/2014  . Leg swelling 03/31/2011  . Essential hypertension 03/07/2011  . 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 Right arm pain 08/04/2006  . Osteoarthritis 02/14/2006  . Diabetes mellitus type 2, uncontrolled (HCC) 01/11/2006    Nyellie Yetter 01/12/2017, 10:02 PM  [facility] [address] [place], [place], [address] Phone: [phone]   Fax:  [phone]  Name: Rachel Gonzalez MRN: [id number] Date of Birth: 26-Feb-1951   Karie MainlandJennifer Quinn Quam, PT 01/12/17 10:03 PM Phone: [phone] Fax: [phone]

## 2017-01-15 ENCOUNTER — Ambulatory Visit: Payer: Medicare Other | Admitting: Physical Therapy

## 2017-01-15 ENCOUNTER — Encounter: Payer: Self-pay | Admitting: Physical Therapy

## 2017-01-15 DIAGNOSIS — M79621 Pain in right upper arm: Secondary | ICD-10-CM | POA: Diagnosis not present

## 2017-01-15 DIAGNOSIS — M542 Cervicalgia: Secondary | ICD-10-CM

## 2017-01-15 DIAGNOSIS — M5412 Radiculopathy, cervical region: Secondary | ICD-10-CM

## 2017-01-15 DIAGNOSIS — R208 Other disturbances of skin sensation: Secondary | ICD-10-CM

## 2017-01-15 DIAGNOSIS — M791 Myalgia, unspecified site: Secondary | ICD-10-CM | POA: Diagnosis not present

## 2017-01-15 NOTE — Therapy (Signed)
[facility] [address] [place], [name], [address] Phone: [phone]   Fax:  [phone]  Physical Therapy Treatment  Patient Details  Name: Rachel Gonzalez MRN: [id number] Date of Birth: Apr 03, 1950 Referring Provider: Dr. Ronne Binning    Encounter Date: 01/15/2017  PT End of Session - 01/15/17 1200    Visit Number  2    Number of Visits  16    Date for PT Re-Evaluation  03/09/17    PT Start Time  1100    PT Stop Time  1200    PT Time Calculation (min)  60 min       Past Medical History:  Diagnosis Date  . Anemia   . Diabetes mellitus   . Hypertension   . Renal disorder     Past Surgical History:  Procedure Laterality Date  . arm surgery     . CESAREAN SECTION      There were no vitals filed for this visit.  Subjective Assessment - 01/15/17 1102    Subjective  Pt reports she is about the same since eval.     Currently in Pain?  Yes    Pain Score  8     Pain Location  Neck    Pain Orientation  Right    Pain Radiating Towards  arm and hand                       [facility] Adult PT Treatment/Exercise - 01/15/17 0001      Neck Exercises: Seated   Neck Retraction  5 reps    Other Seated Exercise  scap retract x 10, shoulder rolls forward and backward     Other Seated Exercise  Cervical AROM rotation, side bend, flexion, extension x 5 each bilateral       Shoulder Exercises: Pulleys   Flexion Limitations  discontinued due to pain       Shoulder Exercises: ROM/Strengthening   Other ROM/Strengthening Exercises  UE ranger standing at 90 degrees flexion, horizontal abduction/adductin      Modalities   Modalities  Electrical Stimulation      Electrical Stimulation   Electrical Stimulation Location  Right upper trap/shoulder     Electrical Stimulation Action  IFC x 15 min    Electrical Stimulation Parameters  14    Electrical Stimulation Goals  Pain      Manual Therapy   Passive ROM  side  bend and rotation, levator stretch, cervical distraction              PT Education - 01/15/17 1154    Education provided  Yes    Education Details  HEP    Person(s) Educated  Patient    Methods  Explanation;Handout    Comprehension  Verbalized understanding       PT Short Term Goals - 01/12/17 2131      PT SHORT TERM GOAL #1   Title  Pt will be I with HEP for cervical ROM, UE AROM    Time  4    Period  Weeks    Status  New    Target Date  02/09/17      PT SHORT TERM GOAL #2   Title  Pt will be able to use Rt. UE for a portion of ADLs with mod pain (5/10)    Time  4    Period  Weeks    Status  New    Target Date  02/09/17      PT SHORT TERM GOAL #3   Title  Pt will complete Balance Screen and set goal, if appropriate    Time  4    Period  Weeks    Status  New    Target Date  02/09/17      PT SHORT TERM GOAL #4   Title  Pt will better understand posture and body mechanics for lifting, sitting to preserve neck pain, UE function.     Time  4    Period  Weeks    Status  New    Target Date  02/09/17        PT Long Term Goals - 01/12/17 0600      PT LONG TERM GOAL #1   Title  Pt will be able to improve ability to turn head >60 deg in each direction for safer driving.     Time  8    Period  Weeks    Status  New    Target Date  03/09/17      PT LONG TERM GOAL #2   Title  Pt will be able to reach Rt. UE to shoulder height for improved functional mobility in the home, kitchen and with ADLs.     Time  8    Period  Weeks    Status  New    Target Date  03/09/17      PT LONG TERM GOAL #3   Title  Pt will be able to sleep on L side for short periods at night and pain improved 25%     Time  8    Period  Weeks    Status  New    Target Date  03/09/17      PT LONG TERM GOAL #4   Title  Pt will be able to read, look down with less pain in neck and Rt arm (<15 min, pain <5/10)     Time  8    Period  Weeks    Status  New    Target Date  03/09/17             Plan - 01/15/17 1154    Clinical Impression Statement  Pt arrives with 8/10 pain. Established HEP for neck ROM and stability. Manual PROM and soft tisuue work to right upper traps performed prior to trial of IFC with HMP. After modalities, pt reports feeling much better.     PT Next Visit Plan  Review HEP (gentle cervial ROM and stab,  add gentle nerve glides in hooklying.) , UE ranger for Rt. UE , consider manual/mech traction and ice/heat post    PT Home Exercise Plan  Neck AROM, chin tuck, scap squeeze     Consulted and Agree with Plan of Care  Patient       Patient will benefit from skilled therapeutic intervention in order to improve the following deficits and impairments:  Abnormal gait, Increased fascial restricitons, Impaired sensation, Pain, Decreased mobility, Postural dysfunction, Impaired UE functional use, Decreased strength, Decreased range of motion, Impaired flexibility, Increased edema, Decreased balance, Decreased activity tolerance  Visit Diagnosis: Cervicalgia  Pain in right upper arm  Other disturbances of skin sensation  Myalgia  Radiculopathy, cervical region     Problem List Patient Active Problem List   Diagnosis Date Noted  . Stage 3 chronic kidney disease (HCC) 12/01/2016  . Frequent falls 04/28/2016  . Right arm weakness 04/28/2016  . Bilateral lower extremity edema 04/28/2016  .  Screening for colon cancer 04/28/2016  . Abnormal urinalysis 09/13/2014  . Chest pain 09/13/2014  . Abdominal pain, recurrent 09/13/2014  . Dehydration with hyponatremia 09/13/2014  . Acute hyperkalemia 09/13/2014  . Acute renal failure superimposed on stage 3 chronic kidney disease (HCC) 09/13/2014  . Anemia 05/06/2014  . Diabetic neuropathy, type II diabetes mellitus (HCC) 05/06/2014  . Obesity (BMI 30-39.9) 05/06/2014  . Cellulitis of right foot 05/06/2014  . Leg swelling 03/31/2011  . Essential hypertension 03/07/2011  . PERS HX NONCOMPLIANCE W/MED  TX PRS HAZARDS HLTH 02/07/2009  . Right arm pain 08/04/2006  . Osteoarthritis 02/14/2006  . Diabetes mellitus type 2, uncontrolled (HCC) 01/11/2006    Sherrie Mustacheonoho, Santo Zahradnik McGee, PTA 01/15/2017, 12:15 PM  [facility] [address] [place], [place], [address] Phone: [phone]   Fax:  [phone]  Name: Rachel Gonzalez MRN: [id number] Date of Birth: 1951-03-09

## 2017-01-15 NOTE — Patient Instructions (Signed)
  Scapular Retraction (Standing)   With arms at sides, pinch shoulder blades together. Repeat __10__ times per set. Do _2___ sets per session. Do ___2_ sessions per day.  http://orth.exer.us/944   Copyright  VHI. All rights reserved.  Chin Protraction / Retraction   Slide head forward keeping chin level. Slide head back, pulling chin in. Hold each position __5_ seconds. Repeat __10_ times. Do __2_ sessions per day.  Copyright  VHI. All rights reserved.  AROM: Neck Rotation   Turn head slowly to look over one shoulder, then the other. Hold each position ___5_ seconds. Repeat __5__ times per set. Do __1__ sets per session. Do __5__ sessions per day.  http://orth.exer.us/294   Copyright  VHI. All rights reserved.  AROM: Lateral Neck Flexion   Slowly tilt head toward one shoulder, then the other. Hold each position _5___ seconds. Repeat __5__ times per set. Do __1__ sets per session. Do _2___ sessions per day.  http://orth.exer.us/296   Copyright  VHI. All rights reserved.  Extension   Hands behind neck, bend head back as far as is comfortable. Hold __5__ seconds. Repeat ___5_ times. Do _2___ sessions per day.  Copyright  VHI. All rights reserved.  AROM: Neck Flexion   Bend head forward. Hold __5__ seconds. Repeat ___5_ times per set. Do __1__ sets per session. Do __2__ sessions per day.  http://orth.exer.us/298   Copyright  VHI. All rights reserved.

## 2017-01-19 ENCOUNTER — Ambulatory Visit: Payer: Medicare Other | Admitting: Physical Therapy

## 2017-01-19 ENCOUNTER — Encounter: Payer: Self-pay | Admitting: Physical Therapy

## 2017-01-19 DIAGNOSIS — M5412 Radiculopathy, cervical region: Secondary | ICD-10-CM | POA: Diagnosis not present

## 2017-01-19 DIAGNOSIS — M791 Myalgia, unspecified site: Secondary | ICD-10-CM | POA: Diagnosis not present

## 2017-01-19 DIAGNOSIS — R208 Other disturbances of skin sensation: Secondary | ICD-10-CM

## 2017-01-19 DIAGNOSIS — M79621 Pain in right upper arm: Secondary | ICD-10-CM | POA: Diagnosis not present

## 2017-01-19 DIAGNOSIS — M542 Cervicalgia: Secondary | ICD-10-CM

## 2017-01-19 NOTE — Therapy (Signed)
[facility] [address] [place], [name], [address] Phone: [phone]   Fax:  [phone]  Physical Therapy Treatment  Patient Details  Name: Rachel Gonzalez MRN: [id number] Date of Birth: Jul 07, 1950 Referring Provider: Dr. Ronne Binning    Encounter Date: 01/19/2017  PT End of Session - 01/19/17 1503    Visit Number  3    Number of Visits  16    Date for PT Re-Evaluation  03/09/17    PT Start Time  0300    PT Stop Time  0350    PT Time Calculation (min)  50 min       Past Medical History:  Diagnosis Date  . Anemia   . Diabetes mellitus   . Hypertension   . Renal disorder     Past Surgical History:  Procedure Laterality Date  . arm surgery     . CESAREAN SECTION      There were no vitals filed for this visit.  Subjective Assessment - 01/19/17 1504    Currently in Pain?  Yes    Pain Score  7     Pain Location  Neck    Pain Descriptors / Indicators  Throbbing    Pain Radiating Towards  arm and hand     Aggravating Factors   using arm and ADLs     Pain Relieving Factors  meds, not much                       [facility] Adult PT Treatment/Exercise - 01/19/17 0001      Neck Exercises: Seated   Other Seated Exercise  scap retract x 10, shoulder rolls forward and backward     Other Seated Exercise  Cervical AROM rotation, side bend, flexion, extension x 5 each bilateral       Shoulder Exercises: ROM/Strengthening   Other ROM/Strengthening Exercises  UE ranger standing at 90 degrees flexion, horizontal abduction/adductin      Modalities   Modalities  Traction      Traction   Type of Traction  Cervical    Min (lbs)  7    Max (lbs)  18 increased from 15 per pt request     Hold Time  60    Rest Time  10    Time  10      Manual Therapy   Manual Therapy  Myofascial release;Soft tissue mobilization    Soft tissue mobilization  trigger point release x 3 right upper trap    Myofascial Release   right paraspinals , middle scalenes     Passive ROM  side bend and rotation, levator stretch, cervical distraction                PT Short Term Goals - 01/12/17 2131      PT SHORT TERM GOAL #1   Title  Pt will be I with HEP for cervical ROM, UE AROM    Time  4    Period  Weeks    Status  New    Target Date  02/09/17      PT SHORT TERM GOAL #2   Title  Pt will be able to use Rt. UE for a portion of ADLs with mod pain (5/10)    Time  4    Period  Weeks    Status  New    Target Date  02/09/17      PT SHORT TERM GOAL #3  Title  Pt will complete Balance Screen and set goal, if appropriate    Time  4    Period  Weeks    Status  New    Target Date  02/09/17      PT SHORT TERM GOAL #4   Title  Pt will better understand posture and body mechanics for lifting, sitting to preserve neck pain, UE function.     Time  4    Period  Weeks    Status  New    Target Date  02/09/17        PT Long Term Goals - 01/12/17 0600      PT LONG TERM GOAL #1   Title  Pt will be able to improve ability to turn head >60 deg in each direction for safer driving.     Time  8    Period  Weeks    Status  New    Target Date  03/09/17      PT LONG TERM GOAL #2   Title  Pt will be able to reach Rt. UE to shoulder height for improved functional mobility in the home, kitchen and with ADLs.     Time  8    Period  Weeks    Status  New    Target Date  03/09/17      PT LONG TERM GOAL #3   Title  Pt will be able to sleep on L side for short periods at night and pain improved 25%     Time  8    Period  Weeks    Status  New    Target Date  03/09/17      PT LONG TERM GOAL #4   Title  Pt will be able to read, look down with less pain in neck and Rt arm (<15 min, pain <5/10)     Time  8    Period  Weeks    Status  New    Target Date  03/09/17            Plan - 01/19/17 1513    Clinical Impression Statement  Pt reports decreased pain after last visit until the next day. Reports more  pain in arm and hand today at 8/10.  Neck still 7/10 on right side. Reviewed HEP. Cervical distraction reduces distal symproms in UE. Trial of Mechanical traction today for 10 minutes. She reported a little less UE/ hand pain afterward.     PT Next Visit Plan  Review HEP (gentle cervial ROM and stab,  add gentle nerve glides in hooklying.) , UE ranger for Rt. UE ,assess benefit of mechanical traction and continue or perform manual traction,  ice/heat post/IFC     PT Home Exercise Plan  Neck AROM, chin tuck, scap squeeze     Consulted and Agree with Plan of Care  Patient       Patient will benefit from skilled therapeutic intervention in order to improve the following deficits and impairments:  Abnormal gait, Increased fascial restricitons, Impaired sensation, Pain, Decreased mobility, Postural dysfunction, Impaired UE functional use, Decreased strength, Decreased range of motion, Impaired flexibility, Increased edema, Decreased balance, Decreased activity tolerance  Visit Diagnosis: Cervicalgia  Pain in right upper arm  Myalgia  Radiculopathy, cervical region  Other disturbances of skin sensation     Problem List Patient Active Problem List   Diagnosis Date Noted  . Stage 3 chronic kidney disease (HCC) 12/01/2016  . Frequent falls 04/28/2016  .  Right arm weakness 04/28/2016  . Bilateral lower extremity edema 04/28/2016  . Screening for colon cancer 04/28/2016  . Abnormal urinalysis 09/13/2014  . Chest pain 09/13/2014  . Abdominal pain, recurrent 09/13/2014  . Dehydration with hyponatremia 09/13/2014  . Acute hyperkalemia 09/13/2014  . Acute renal failure superimposed on stage 3 chronic kidney disease (HCC) 09/13/2014  . Anemia 05/06/2014  . Diabetic neuropathy, type II diabetes mellitus (HCC) 05/06/2014  . Obesity (BMI 30-39.9) 05/06/2014  . Cellulitis of right foot 05/06/2014  . Leg swelling 03/31/2011  . Essential hypertension 03/07/2011  . PERS HX NONCOMPLIANCE W/MED TX  PRS HAZARDS HLTH 02/07/2009  . Right arm pain 08/04/2006  . Osteoarthritis 02/14/2006  . Diabetes mellitus type 2, uncontrolled (HCC) 01/11/2006    Sherrie Mustacheonoho, Otilia Kareem McGee, PTA 01/19/2017, 3:58 PM  [facility] [address] [place], [place], [address] Phone: [phone]   Fax:  [phone]  Name: Josefa HalfCarol A Nicasio MRN: [id number] Date of Birth: [birth year]

## 2017-01-22 ENCOUNTER — Encounter: Payer: Self-pay | Admitting: Physical Therapy

## 2017-01-22 ENCOUNTER — Ambulatory Visit: Payer: Medicare Other | Admitting: Physical Therapy

## 2017-01-22 DIAGNOSIS — M79621 Pain in right upper arm: Secondary | ICD-10-CM | POA: Diagnosis not present

## 2017-01-22 DIAGNOSIS — M791 Myalgia, unspecified site: Secondary | ICD-10-CM | POA: Diagnosis not present

## 2017-01-22 DIAGNOSIS — M5412 Radiculopathy, cervical region: Secondary | ICD-10-CM | POA: Diagnosis not present

## 2017-01-22 DIAGNOSIS — R208 Other disturbances of skin sensation: Secondary | ICD-10-CM | POA: Diagnosis not present

## 2017-01-22 DIAGNOSIS — M542 Cervicalgia: Secondary | ICD-10-CM

## 2017-01-22 NOTE — Therapy (Signed)
[facility] [address] [place], [place], [address] Phone: [phone]   Fax:  [phone]  Physical Therapy Treatment  Patient Details  Name: Rachel Gonzalez MRN: [id number] Date of Birth: [birth year] Referring Provider: Dr. Ronne BinningFrederick Newton    Encounter Date: 01/22/2017  PT End of Session - 01/22/17 1100    Visit Number  4    Number of Visits  16    Date for PT Re-Evaluation  03/09/17    PT Start Time  1057    PT Stop Time  1205    PT Time Calculation (min)  68 min       Past Medical History:  Diagnosis Date  . Anemia   . Diabetes mellitus   . Hypertension   . Renal disorder     Past Surgical History:  Procedure Laterality Date  . 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 arm surgery     . CESAREAN SECTION    . IRRIGATION AND DEBRIDEMENT FOOT Right 05/08/2014   Performed by Nadara Mustarduda, [facility]    There were no vitals filed for this visit.  Subjective Assessment - 01/22/17 1100    Subjective  Sore and stiff today.     Currently in Pain?  Yes    Pain Score  7     Pain Location  Neck    Pain Orientation  Right    Pain Descriptors / Indicators  Sore stiff    Pain Radiating Towards  arm and hand                       [facility] Adult PT Treatment/Exercise - 01/22/17 0001      Neck Exercises: Standing   Other Standing Exercises  Radial ulnar and medial nerve  glides in standing       Shoulder Exercises: Supine   Other Supine Exercises  supine cane press up x 10, pullovers x 10       Shoulder Exercises: Standing   Row  10 reps    Theraband Level (Shoulder Row)  Level 2 (Red)      Shoulder Exercises: Pulleys   Flexion  1 minute      Electrical Stimulation   Electrical Stimulation Location  Right upper trap/right shoulder    Electrical Stimulation Action  IFC x 15 minutes     Electrical Stimulation Parameters  15     Electrical  Stimulation Goals  Pain      Traction   Min (lbs)  8    Max (lbs)  15    Hold Time  60    Rest Time  10    Time  10      Manual Therapy   Soft tissue mobilization  trigger point release x 2 right upper trap    Myofascial Release  right paraspinals , middle scalenes     Passive ROM  side bend and rotation, levator stretch, cervical distraction     Neural Stretch  Rt. UE nerve glides , medial and radial                PT Short Term Goals - 01/12/17 2131      PT SHORT TERM GOAL #1   Title  Pt will be I with HEP for cervical ROM, UE AROM    Time  4    Period  Weeks  Status  New    Target Date  02/09/17      PT SHORT TERM GOAL #2   Title  Pt will be able to use Rt. UE for a portion of ADLs with mod pain (5/10)    Time  4    Period  Weeks    Status  New    Target Date  02/09/17      PT SHORT TERM GOAL #3   Title  Pt will complete Balance Screen and set goal, if appropriate    Time  4    Period  Weeks    Status  New    Target Date  02/09/17      PT SHORT TERM GOAL #4   Title  Pt will better understand posture and body mechanics for lifting, sitting to preserve neck pain, UE function.     Time  4    Period  Weeks    Status  New    Target Date  02/09/17        PT Long Term Goals - 01/12/17 0600      PT LONG TERM GOAL #1   Title  Pt will be able to improve ability to turn head >60 deg in each direction for safer driving.     Time  8    Period  Weeks    Status  New    Target Date  03/09/17      PT LONG TERM GOAL #2   Title  Pt will be able to reach Rt. UE to shoulder height for improved functional mobility in the home, kitchen and with ADLs.     Time  8    Period  Weeks    Status  New    Target Date  03/09/17      PT LONG TERM GOAL #3   Title  Pt will be able to sleep on L side for short periods at night and pain improved 25%     Time  8    Period  Weeks    Status  New    Target Date  03/09/17      PT LONG TERM GOAL #4   Title  Pt will be able  to read, look down with less pain in neck and Rt arm (<15 min, pain <5/10)     Time  8    Period  Weeks    Status  New    Target Date  03/09/17            Plan - 01/22/17 1102    Clinical Impression Statement  Pt reports decreased right arm symptoms for 6 hours after cervical traction. She reports 7/10 pain in right neck, shoulder, arm and hand today. Performed postural and cervical PROM and repeated cervical traction. Also spplied Estim as pt reports this is helpful. Began active and passive nerve glides which pt reports feeling stetch in hand. Prior to estim pt reports feeling "really good. "     PT Next Visit Plan  Review nerve glides, perform passively as wel/, gentle ROM and stab, UE ranger/ AAROM shoulder, estim and traction.     PT Home Exercise Plan  Neck AROM, chin tuck, scap squeeze, Ue nerve glides: median and radial    Consulted and Agree with Plan of Care  Patient       Patient will benefit from skilled therapeutic intervention in order to improve the following deficits and impairments:  Abnormal gait, Increased fascial restricitons, Impaired  sensation, Pain, Decreased mobility, Postural dysfunction, Impaired UE functional use, Decreased strength, Decreased range of motion, Impaired flexibility, Increased edema, Decreased balance, Decreased activity tolerance  Visit Diagnosis: Cervicalgia  Pain in right upper arm  Myalgia  Radiculopathy, cervical region  Other disturbances of skin sensation     Problem List Patient Active Problem List   Diagnosis Date Noted  . Stage 3 chronic kidney disease (HCC) 12/01/2016  . Frequent falls 04/28/2016  . Right arm weakness 04/28/2016  . Bilateral lower extremity edema 04/28/2016  . Screening for colon cancer 04/28/2016  . Abnormal urinalysis 09/13/2014  . Chest pain 09/13/2014  . Abdominal pain, recurrent 09/13/2014  . Dehydration with hyponatremia 09/13/2014  . Acute hyperkalemia 09/13/2014  . Acute renal failure  superimposed on stage 3 chronic kidney disease (HCC) 09/13/2014  . Anemia 05/06/2014  . Diabetic neuropathy, type II diabetes mellitus (HCC) 05/06/2014  . Obesity (BMI 30-39.9) 05/06/2014  . Cellulitis of right foot 05/06/2014  . Leg swelling 03/31/2011  . Essential hypertension 03/07/2011  . 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 Right arm pain 08/04/2006  . Osteoarthritis 02/14/2006  . Diabetes mellitus type 2, uncontrolled (HCC) 01/11/2006    Sherrie Mustacheonoho, Tobenna Needs McGee, PTA 01/22/2017, 12:05 PM  [facility] [address] [place], [place], [address] Phone: [phone]   Fax:  [phone]  Name: Rachel Gonzalez MRN: [id number] Date of Birth: 1950/06/27

## 2017-01-26 ENCOUNTER — Ambulatory Visit: Payer: Medicare Other | Admitting: Physical Therapy

## 2017-01-26 ENCOUNTER — Encounter: Payer: Self-pay | Admitting: Physical Therapy

## 2017-01-26 DIAGNOSIS — M791 Myalgia, unspecified site: Secondary | ICD-10-CM | POA: Diagnosis not present

## 2017-01-26 DIAGNOSIS — M79621 Pain in right upper arm: Secondary | ICD-10-CM

## 2017-01-26 DIAGNOSIS — R208 Other disturbances of skin sensation: Secondary | ICD-10-CM

## 2017-01-26 DIAGNOSIS — M542 Cervicalgia: Secondary | ICD-10-CM | POA: Diagnosis not present

## 2017-01-26 DIAGNOSIS — M5412 Radiculopathy, cervical region: Secondary | ICD-10-CM

## 2017-01-26 NOTE — Therapy (Signed)
[name], [place], [address] Phone: [phone]   Fax:  [phone]  Physical Therapy Treatment  Patient Details  Name: Rachel Gonzalez MRN: [id number] Date of Birth: 1951/02/08 Referring Provider: Dr. Vonzella Nipple    Encounter Date: 01/26/2017  PT End of Session - 01/26/17 1340    Visit Number  5    Number of Visits  16    Date for PT Re-Evaluation  03/09/17    PT Start Time  0130    PT Stop Time  0230    PT Time Calculation (min)  60 min       Past Medical History:  Diagnosis Date  . Anemia   . Diabetes mellitus   . Hypertension   . Renal disorder     Past Surgical History:  Procedure Laterality Date  . AMPUTATION Right 05/08/2014   Procedure: AMPUTATION RAY, right great toe;  Surgeon: Newt Minion, MD;  Location: [facility];  Service: Orthopedics;  Laterality: Right;  . arm surgery     . CESAREAN SECTION    . I&D EXTREMITY Right 05/08/2014   Procedure: IRRIGATION AND DEBRIDEMENT FOOT;  Surgeon: Newt Minion, MD;  Location: [facility];  Service: Orthopedics;  Laterality: Right;    There were no vitals filed for this visit.  Subjective Assessment - 01/26/17 1339    Subjective  I am hurting today.     Currently in Pain?  Yes    Pain Score  9     Pain Location  Shoulder    Pain Orientation  Right    Pain Descriptors / Indicators  Throbbing    Pain Radiating Towards  right hand, fingers     Aggravating Factors   using arm     Pain Relieving Factors  meds, leave it alone                       [facility] Adult PT Treatment/Exercise - 01/26/17 0001      Shoulder Exercises: Supine   Other Supine Exercises  supine cane press up x 10, pullovers x 10       Electrical Stimulation   Electrical Stimulation Location  Right shoulder    Electrical Stimulation Action  IFC x 15 min    Electrical Stimulation Parameters  20    Electrical Stimulation Goals  Pain      Manual Therapy   Passive  ROM  right shoulder ER,IR, flexion and abduction    Manual Traction  cervical spine; 7, 1 minute holds, strong pull    Neural Stretch  Rt. UE nerve glides , medial and radial                PT Short Term Goals - 01/26/17 1429      PT SHORT TERM GOAL #1   Title  Pt will be I with HEP for cervical ROM, UE AROM    Time  4    Period  Weeks    Status  Achieved      PT SHORT TERM GOAL #2   Title  Pt will be able to use Rt. UE for a portion of ADLs with mod pain (5/10)    Time  4    Period  Weeks    Status  On-going      PT SHORT TERM GOAL #3   Title  Pt will complete Balance Screen and set goal, if appropriate    Time  4    Period  Weeks    Status  Partially Met      PT SHORT TERM GOAL #4   Title  Pt will better understand posture and body mechanics for lifting, sitting to preserve neck pain, UE function.     Time  4    Status  Unable to assess        PT Long Term Goals - 01/12/17 0600      PT LONG TERM GOAL #1   Title  Pt will be able to improve ability to turn head >60 deg in each direction for safer driving.     Time  8    Period  Weeks    Status  New    Target Date  03/09/17      PT LONG TERM GOAL #2   Title  Pt will be able to reach Rt. UE to shoulder height for improved functional mobility in the home, kitchen and with ADLs.     Time  8    Period  Weeks    Status  New    Target Date  03/09/17      PT LONG TERM GOAL #3   Title  Pt will be able to sleep on L side for short periods at night and pain improved 25%     Time  8    Period  Weeks    Status  New    Target Date  03/09/17      PT LONG TERM GOAL #4   Title  Pt will be able to read, look down with less pain in neck and Rt arm (<15 min, pain <5/10)     Time  8    Period  Weeks    Status  New    Target Date  03/09/17            Plan - 01/26/17 1414    Clinical Impression Statement  Pt reports cervical traction was not as helpful last visit. Performed manual traction which pt prefers.  This decreases her arm and shoulder pain but no relief of hand/finger pain. She  has improved tolerance to shoulder PROM. She is still limited in all ADLs. She does note an improvement in neck pain. Perfomed Estim to right shoulder. Independent with initial HEP. STG#1. met. Encouraged pt schedule follow up with MD.     PT Next Visit Plan  CHECK GOALS/ROM; Review nerve glides, perform passively as wel/, gentle ROM and stab, UE ranger/ AAROM shoulder, estim and traction.     PT Home Exercise Plan  Neck AROM, chin tuck, scap squeeze, Ue nerve glides: median and radial    Consulted and Agree with Plan of Care  Patient       Patient will benefit from skilled therapeutic intervention in order to improve the following deficits and impairments:  Abnormal gait, Increased fascial restricitons, Impaired sensation, Pain, Decreased mobility, Postural dysfunction, Impaired UE functional use, Decreased strength, Decreased range of motion, Impaired flexibility, Increased edema, Decreased balance, Decreased activity tolerance  Visit Diagnosis: Cervicalgia  Pain in right upper arm  Myalgia  Other disturbances of skin sensation  Radiculopathy, cervical region     Problem List Patient Active Problem List   Diagnosis Date Noted  . Stage 3 chronic kidney disease ([facility]) 12/01/2016  . Frequent falls 04/28/2016  . Right arm weakness 04/28/2016  . Bilateral lower extremity edema 04/28/2016  . Screening for colon cancer 04/28/2016  . Abnormal urinalysis 09/13/2014  . Chest  pain 09/13/2014  . Abdominal pain, recurrent 09/13/2014  . Dehydration with hyponatremia 09/13/2014  . Acute hyperkalemia 09/13/2014  . Acute renal failure superimposed on stage 3 chronic kidney disease ([facility]) 09/13/2014  . Anemia 05/06/2014  . Diabetic neuropathy, type II diabetes mellitus ([facility]) 05/06/2014  . Obesity (BMI 30-39.9) 05/06/2014  . Cellulitis of right foot 05/06/2014  . Leg swelling 03/31/2011  . Essential hypertension  03/07/2011  . 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 Right arm pain 08/04/2006  . Osteoarthritis 02/14/2006  . Diabetes mellitus type 2, uncontrolled ([facility]) 01/11/2006    Dorene Ar, PTA 01/26/2017, 3:33 PM  [facility] [address] [place], [place], [address] Phone: [phone]   Fax:  [phone]  Name: Rachel Gonzalez MRN: [id number] Date of Birth: [birth year]

## 2017-02-02 ENCOUNTER — Encounter: Payer: Self-pay | Admitting: Physical Therapy

## 2017-02-02 ENCOUNTER — Ambulatory Visit: Payer: Medicare Other | Admitting: Physical Therapy

## 2017-02-02 DIAGNOSIS — M79621 Pain in right upper arm: Secondary | ICD-10-CM | POA: Diagnosis not present

## 2017-02-02 DIAGNOSIS — M791 Myalgia, unspecified site: Secondary | ICD-10-CM | POA: Diagnosis not present

## 2017-02-02 DIAGNOSIS — M542 Cervicalgia: Secondary | ICD-10-CM | POA: Diagnosis not present

## 2017-02-02 DIAGNOSIS — R208 Other disturbances of skin sensation: Secondary | ICD-10-CM | POA: Diagnosis not present

## 2017-02-02 DIAGNOSIS — M5412 Radiculopathy, cervical region: Secondary | ICD-10-CM | POA: Diagnosis not present

## 2017-02-02 NOTE — Therapy (Signed)
[facility] [address] [place], [place], [address] Phone: [phone]   Fax:  [phone]  Physical Therapy Treatment  Patient Details  Name: Rachel HalfCarol A Kulikowski MRN: [id number] Date of Birth: 1950/09/27 Referring Provider: Dr. Ronne BinningFrederick Newton    Encounter Date: 02/02/2017  PT End of Session - 02/02/17 1517    Visit Number  6    Number of Visits  16    Date for PT Re-Evaluation  03/09/17    PT Start Time  0300    PT Stop Time  0345    PT Time Calculation (min)  45 min       Past Medical History:  Diagnosis Date  . Anemia   . Diabetes mellitus   . Hypertension   . Renal disorder     Past Surgical History:  Procedure Laterality Date  . AMPUTATION Right 05/08/2014   Procedure: AMPUTATION RAY, right great toe;  Surgeon: Nadara MustardMarcus Duda V, MD;  [facility];  Service: Orthopedics;  Laterality: Right;  . arm surgery     . CESAREAN SECTION    . I&D EXTREMITY Right 05/08/2014   Procedure: IRRIGATION AND DEBRIDEMENT FOOT;  Surgeon: Nadara MustardMarcus Duda V, MD;  [facility];  Service: Orthopedics;  Laterality: Right;    There were no vitals filed for this visit.  Subjective Assessment - 02/02/17 1510    Subjective  I fell twice. I normally use a cane but I neve bring it in here.     Currently in Pain?  Yes    Pain Score  8     Pain Location  Shoulder    Pain Orientation  Right    Pain Radiating Towards  right hand fingers     Aggravating Factors   constant     Pain Relieving Factors  BC         [facility] PT Assessment - 02/02/17 0001      AROM   Right Shoulder Extension  24 Degrees    Right Shoulder Flexion  120 Degrees    Right Shoulder ABduction  90 Degrees    Right Shoulder Internal Rotation  -- unable FR    Right Shoulder External Rotation  -- Touch back of neck     Cervical - Right Rotation  55    Cervical - Left Rotation  55                  [facility] Adult PT Treatment/Exercise - 02/02/17 0001      Neck  Exercises: Standing   Other Standing Exercises  Radial ulnar, median and medial nerve  glides in standing       Neck Exercises: Seated   Neck Retraction  5 reps    Other Seated Exercise  scap retract x 10, shoulder rolls forward and backward       Shoulder Exercises: Supine   Other Supine Exercises  --      Modalities   Modalities  Ultrasound      Electrical Stimulation   Electrical Stimulation Location  Right shoulder, cervical paraspinlas     Electrical Stimulation Action  IFC x 15 minutes     Electrical Stimulation Parameters  15    Electrical Stimulation Goals  Pain      Ultrasound   Ultrasound Location  Right anterior shoulder     Ultrasound Parameters  1mhz, 1.2 w/cm2, 50% x  minutes    Ultrasound Goals  Pain  PT Short Term Goals - 02/02/17 1602      PT SHORT TERM GOAL #1   Title  Pt will be I with HEP for cervical ROM, UE AROM    Time  4    Period  Weeks    Status  Achieved      PT SHORT TERM GOAL #2   Title  Pt will be able to use Rt. UE for a portion of ADLs with mod pain (5/10)    Time  4    Period  Weeks    Status  On-going      PT SHORT TERM GOAL #3   Title  Pt will complete Balance Screen and set goal, if appropriate    Period  Weeks    Status  Unable to assess      PT SHORT TERM GOAL #4   Title  Pt will better understand posture and body mechanics for lifting, sitting to preserve neck pain, UE function.     Baseline  verbal education provided    Time  4    Period  Weeks    Status  On-going        PT Long Term Goals - 01/12/17 0600      PT LONG TERM GOAL #1   Title  Pt will be able to improve ability to turn head >60 deg in each direction for safer driving.     Time  8    Period  Weeks    Status  New    Target Date  03/09/17      PT LONG TERM GOAL #2   Title  Pt will be able to reach Rt. UE to shoulder height for improved functional mobility in the home, kitchen and with ADLs.     Time  8    Period  Weeks    Status   New    Target Date  03/09/17      PT LONG TERM GOAL #3   Title  Pt will be able to sleep on L side for short periods at night and pain improved 25%     Time  8    Period  Weeks    Status  New    Target Date  03/09/17      PT LONG TERM GOAL #4   Title  Pt will be able to read, look down with less pain in neck and Rt arm (<15 min, pain <5/10)     Time  8    Period  Weeks    Status  New    Target Date  03/09/17            Plan - 02/02/17 1545    Clinical Impression Statement  Pt reports continued pain that is severe at times in right fingers. Arm and neck are a little better overall. She was able to cook for holiday meal however needed help with chopping and cutting. Trial of ultrasound to anterior shoulder today with pt reporting decreased pain afterward. Continued with assisted nervie glides and gentle postural exercises. Pt to call MD this week for follow up due to lack of progress. We need to check BERG next visit.     PT Next Visit Plan  BERG NEXT VISIT> Schedule more PT visits? or HOLD PT , did she call MD ? GIVE TENS INFO ; Review nerve glides, perform passively as wel/, gentle ROM and stab, UE ranger/ AAROM shoulder, estim and traction.    PT Home  Exercise Plan  Neck AROM, chin tuck, scap squeeze, Ue nerve glides: median and radial    Consulted and Agree with Plan of Care  Patient       Patient will benefit from skilled therapeutic intervention in order to improve the following deficits and impairments:  Abnormal gait, Increased fascial restricitons, Impaired sensation, Pain, Decreased mobility, Postural dysfunction, Impaired UE functional use, Decreased strength, Decreased range of motion, Impaired flexibility, Increased edema, Decreased balance, Decreased activity tolerance  Visit Diagnosis: Cervicalgia  Pain in right upper arm  Myalgia  Other disturbances of skin sensation  Radiculopathy, cervical region     Problem List Patient Active Problem List    Diagnosis Date Noted  . Stage 3 chronic kidney disease ([facility]) 12/01/2016  . Frequent falls 04/28/2016  . Right arm weakness 04/28/2016  . Bilateral lower extremity edema 04/28/2016  . Screening for colon cancer 04/28/2016  . Abnormal urinalysis 09/13/2014  . Chest pain 09/13/2014  . Abdominal pain, recurrent 09/13/2014  . Dehydration with hyponatremia 09/13/2014  . Acute hyperkalemia 09/13/2014  . Acute renal failure superimposed on stage 3 chronic kidney disease ([facility]) 09/13/2014  . Anemia 05/06/2014  . Diabetic neuropathy, type II diabetes mellitus ([facility]) 05/06/2014  . Obesity (BMI 30-39.9) 05/06/2014  . Cellulitis of right foot 05/06/2014  . Leg swelling 03/31/2011  . Essential hypertension 03/07/2011  . 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 Right arm pain 08/04/2006  . Osteoarthritis 02/14/2006  . Diabetes mellitus type 2, uncontrolled ([facility]) 01/11/2006    Sherrie Mustacheonoho, Talita Recht McGee, PTA 02/02/2017, 4:10 PM  [facility] [address] [place], [place], [address] Phone: [phone]   Fax:  [phone]  Name: Rachel HalfCarol A Shrader MRN: [id number] Date of Birth: 1950/06/10

## 2017-02-05 ENCOUNTER — Ambulatory Visit: Payer: Medicare Other | Admitting: Physical Therapy

## 2017-02-17 ENCOUNTER — Encounter (INDEPENDENT_AMBULATORY_CARE_PROVIDER_SITE_OTHER): Payer: Self-pay | Admitting: Orthopedic Surgery

## 2017-02-17 ENCOUNTER — Ambulatory Visit (INDEPENDENT_AMBULATORY_CARE_PROVIDER_SITE_OTHER): Payer: Medicare Other | Admitting: Orthopedic Surgery

## 2017-02-17 DIAGNOSIS — G8929 Other chronic pain: Secondary | ICD-10-CM | POA: Diagnosis not present

## 2017-02-17 DIAGNOSIS — M25511 Pain in right shoulder: Secondary | ICD-10-CM

## 2017-02-18 ENCOUNTER — Encounter (INDEPENDENT_AMBULATORY_CARE_PROVIDER_SITE_OTHER): Payer: Self-pay | Admitting: Orthopedic Surgery

## 2017-02-18 NOTE — Progress Notes (Signed)
Office Visit Note   Patient: Rachel Gonzalez           Date of Birth: [birth year]           MRN: [id number] Visit Date: 02/17/2017 Requested by: No referring provider defined for this encounter. PCP: Patient, No Pcp Per  Subjective: Chief Complaint  Patient presents with  . Right Shoulder - Pain, Follow-up    HPI: Rachel Gonzalez is a 66 year old patient with somewhat ill-defined neck shoulder arm and hand pain.  She describes pain for years.  Denies any history of injury.  She has neck pain but it is better after physical therapy.  She finished physical therapy last week which sounds like primarily consisted of soft tissue manipulation and massage.  She states that her neck is better.  She reports that her right arm is weak.  She states that it is hard for her to pick things up including pulling her pants up.  She reports tremors in the right arm.  She states that neck motion hurts her some.  She also reports dorsal wrist pain as well as swelling in the second and third fingers of the right hand.  She also reports contractures in both the right and left hand on the palmar surface.              ROS: All systems reviewed are negative as they relate to the chief complaint within the history of present illness.  Patient denies  fevers or chills.   Assessment & Plan: Visit Diagnoses:  1. Chronic right shoulder pain     Plan: Impression is neck pain improved with physical therapy but still there is a potential for radiculopathy present.  Her shoulder has symptoms consistent with possible rotator cuff pathology as well as possible frozen shoulder.  Again her exam is difficult to really nail that down.  Her hand shows evidence of swelling of the MCP joints 2 and 3 on the right along with PIP swelling in the same digits.  She also looks like she has Dupuytren's contractures of 2 and 3 on the right and 3 on the left.  This may eventually require surgical intervention.  In regards to the shoulder I would favor  MRI arthrogram of the shoulder to evaluate rotator cuff and labrum.  Would also like to refer her to Dr. Alvester MorinNewton for EMG nerve study of the right upper extremity to evaluate nerve compression.  Negative Tinel's in the cubital tunnel but she may have a component of either carpal tunnel syndrome or cervical radiculopathy.  Follow-Up Instructions: Return for after MRI.   Orders:  Orders Placed This Encounter  Procedures  . MR SHOULDER RIGHT W CONTRAST  . Arthrogram  . Ambulatory referral to Physical Medicine Rehab   No orders of the defined types were placed in this encounter.     Procedures: No procedures performed   Clinical Data: No additional findings.  Objective: Vital Signs: There were no vitals taken for this visit.  Physical Exam:   Constitutional: Patient appears well-developed HEENT:  Head: Normocephalic Eyes:EOM are normal Neck: Normal range of motion Cardiovascular: Normal rate Pulmonary/chest: Effort normal Neurologic: Patient is alert Skin: Skin is warm Psychiatric: Patient has normal mood and affect    Ortho Exam: Orthopedic exam demonstrates some pain with range of motion of the neck including flexion extension and rotation.  She reports trapezial tenderness on the right.  There is no scapular dyskinesia on the right or left.  Rotator cuff strength feels  reasonable but it is difficult to examine her arm due to apprehension in general lack of will to move the shoulder.  She has internal rotation in both arms to the posterior superior iliac crest.  Passive external rotation difficult to assess because of guarding.  Negative Tinel's cubital tunnel on the right.  Elbow wrist shoulder range of motion difficult to assess on the right but the elbow and wrist seem to be intact.  On the right-hand side she does have swelling of 2 and 3 MCP joint as well as 2 and 3 MCP joint and PIP joint.  She has early Dupuytren's contracture of the palmar fascia to digits 2 and 3 on the  right and 3 on the left.  This is causing an MCP contracture of about 15 degrees on the right and less than 5 degrees on the left  Specialty Comments:  No specialty comments available.  Imaging: No results found.   PMFS History: Patient Active Problem List   Diagnosis Date Noted  . Stage 3 chronic kidney disease ([facility]) 12/01/2016  . Frequent falls 04/28/2016  . Right arm weakness 04/28/2016  . Bilateral lower extremity edema 04/28/2016  . Screening for colon cancer 04/28/2016  . Abnormal urinalysis 09/13/2014  . Chest pain 09/13/2014  . Abdominal pain, recurrent 09/13/2014  . Dehydration with hyponatremia 09/13/2014  . Acute hyperkalemia 09/13/2014  . Acute renal failure superimposed on stage 3 chronic kidney disease ([facility]) 09/13/2014  . Anemia 05/06/2014  . Diabetic neuropathy, type II diabetes mellitus ([facility]) 05/06/2014  . Obesity (BMI 30-39.9) 05/06/2014  . Cellulitis of right foot 05/06/2014  . Leg swelling 03/31/2011  . Essential hypertension 03/07/2011  . 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 Right arm pain 08/04/2006  . Osteoarthritis 02/14/2006  . Diabetes mellitus type 2, uncontrolled ([facility]) 01/11/2006   Past Medical History:  Diagnosis Date  . Anemia   . Diabetes mellitus   . Hypertension   . Renal disorder     Family History  Problem Relation Age of Onset  . Diabetes Mother     Past Surgical History:  Procedure Laterality Date  . AMPUTATION Right 05/08/2014   Procedure: AMPUTATION RAY, right great toe;  Surgeon: Nadara MustardMarcus Duda V, MD;  Location: [facility];  Service: Orthopedics;  Laterality: Right;  . arm surgery     . CESAREAN SECTION    . I&D EXTREMITY Right 05/08/2014   Procedure: IRRIGATION AND DEBRIDEMENT FOOT;  Surgeon: Nadara MustardMarcus Duda V, MD;  Location: [facility];  Service: Orthopedics;  Laterality: Right;   Social History   Occupational History  . Not on file  Tobacco Use  . Smoking status: Never Smoker  . Smokeless tobacco: Never Used    Substance and Sexual Activity  . Alcohol use: No  . Drug use: No  . Sexual activity: Not on file

## 2017-02-26 ENCOUNTER — Ambulatory Visit: Payer: Medicaid Other | Admitting: Family Medicine

## 2017-03-05 ENCOUNTER — Encounter: Payer: Self-pay | Admitting: Family Medicine

## 2017-03-05 ENCOUNTER — Ambulatory Visit (INDEPENDENT_AMBULATORY_CARE_PROVIDER_SITE_OTHER): Payer: Medicare Other | Admitting: Family Medicine

## 2017-03-05 VITALS — BP 122/62 | HR 64 | Temp 98.3°F | Resp 16 | Ht 64.0 in | Wt 221.0 lb

## 2017-03-05 DIAGNOSIS — E1165 Type 2 diabetes mellitus with hyperglycemia: Secondary | ICD-10-CM | POA: Diagnosis not present

## 2017-03-05 DIAGNOSIS — G8929 Other chronic pain: Secondary | ICD-10-CM | POA: Diagnosis not present

## 2017-03-05 DIAGNOSIS — R82998 Other abnormal findings in urine: Secondary | ICD-10-CM

## 2017-03-05 DIAGNOSIS — E118 Type 2 diabetes mellitus with unspecified complications: Secondary | ICD-10-CM | POA: Diagnosis not present

## 2017-03-05 DIAGNOSIS — M25511 Pain in right shoulder: Secondary | ICD-10-CM

## 2017-03-05 LAB — POCT GLYCOSYLATED HEMOGLOBIN (HGB A1C): HEMOGLOBIN A1C: 12.2

## 2017-03-05 LAB — POCT URINALYSIS DIP (DEVICE)
BILIRUBIN URINE: NEGATIVE
GLUCOSE, UA: NEGATIVE mg/dL
KETONES UR: NEGATIVE mg/dL
Nitrite: NEGATIVE
PROTEIN: NEGATIVE mg/dL
SPECIFIC GRAVITY, URINE: 1.02 (ref 1.005–1.030)
Urobilinogen, UA: 0.2 mg/dL (ref 0.0–1.0)
pH: 5.5 (ref 5.0–8.0)

## 2017-03-05 LAB — GLUCOSE, CAPILLARY: Glucose-Capillary: 242 mg/dL — ABNORMAL HIGH (ref 65–99)

## 2017-03-05 MED ORDER — INSULIN ASPART 100 UNIT/ML FLEXPEN: PEN_INJECTOR | SUBCUTANEOUS | 3 refills | Status: DC

## 2017-03-05 MED ORDER — ACETAMINOPHEN-CODEINE #3 300-30 MG PO TABS: 1.0000 | ORAL_TABLET | Freq: Four times a day (QID) | ORAL | 0 refills | Status: DC | PRN

## 2017-03-05 MED ORDER — INSULIN GLARGINE 100 UNIT/ML SOLOSTAR PEN: 35.0000 [IU] | PEN_INJECTOR | Freq: Every day | SUBCUTANEOUS | 3 refills | Status: DC

## 2017-03-05 MED ORDER — GABAPENTIN 300 MG PO CAPS: 300.0000 mg | ORAL_CAPSULE | Freq: Four times a day (QID) | ORAL | 3 refills | Status: DC

## 2017-03-05 NOTE — Patient Instructions (Addendum)
  Your hemoglobin A1c has increased from 7.5 to 12.2. Will increase Lantus to 35 units at bedtime.  We will also add NovoLog 6 units prior to meals. Recommend a lowfat, low carbohydrate diet divided over 5-6 small meals, increase water intake to 6-8 glasses, and 150 minutes per week of cardiovascular exercise.   Your A1C goal is less than 7. Your fasting blood sugar  Upon awakening goal is between 110-140.  Your LDL  (bad cholesterol goal is less than 100 Blood pressure goal is <140/90.  Recommend a lowfat, low carbohydrate diet divided over 5-6 small meals, increase water intake to 6-8 glasses, and 150 minutes per week of cardiovascular exercise.   Take your medications as prescribed Make sure that you are familiar with each one of your medications and what they are used to treat.  If you are unsure of medications, please bring to follow up Will send referral for eye exam  Please keep your scheduled follow up appointment.    We will increase gabapentin to 300 mg 4 times daily for neuropathy For chronic right shoulder pain, will continue Tylenol 3 every 6 hours as needed for moderate to severe pain.

## 2017-03-05 NOTE — Progress Notes (Signed)
Subjective:    Patient ID: Rachel Gonzalez, female    DOB: Oct 16, 1950, 66 y.o.   MRN: [id number]  HPI  Rachel Gonzalez, a 66 year old female with a history of hypertension and diabetes mellitus type 2 presents for a follow up of diabetes mellitus. Most recent hemoglobin a1C was 7.4.  She is not exercising due to chronic right shoulder pain and is not adherent to low salt diet. She does not check blood pressure at home. She endorses lower extremity edema for greater than 1 week.    Patient denies chest pain, chest pressure/discomfort, fatigue, irregular heart beat, near-syncope, palpitations and tachypnea.  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  She states that she has been using Lantus 20 units at bedtime. She did not bring glucometer on today  Patient denies foot ulcerations, increase appetite, nausea, paresthesia of the feet, polydipsia, polyuria, visual disturbances, vomitting and weight loss.    Patient is also complaining of right shoulder pain. She has been follow by Dr. August Saucerean, orthopedic services. She says that pain improved minimally after physical therapy. She says that pain has increased over the past several weeks. She states that she has not been able to dress herself or reach overhead. Her spouse has been assisting with ADLs. Current pain intensity is 5/10 characterized as sharp and intermittent. She says that she is out of Tylenol # 3, which she has taken in the past with moderate relief.  Past Medical History:  Diagnosis Date  . Anemia   . Diabetes mellitus   . Hypertension   . Renal disorder    Immunization History  Administered Date(s) Administered  . Pneumococcal Polysaccharide-23 02/12/2015  . Td 05/07/2014  . Tdap 11/21/2007   Allergies  Allergen Reactions  . Tramadol Nausea And Vomiting   Social History   Socioeconomic History  . Marital status: Married   Spouse name: Not on file  . Number of children: Not on file  . Years of education: Not on file  . Highest education level: Not on file  Social Needs  . Financial resource strain: Not on file  . Food insecurity - worry: Not on file  . Food insecurity - inability: Not on file  . Transportation needs - medical: Not on file  . Transportation needs - non-medical: Not on file  Occupational History  . Not on file  Tobacco Use  . Smoking status: Never Smoker  . Smokeless tobacco: Never Used  Substance and Sexual Activity  . Alcohol use: No  . Drug use: No  . Sexual activity: Not on file  Other Topics Concern  . Not on file  Social History Narrative   [place].   Married.   Three children, 9 grandchildren.   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   [place] [address] [place]   Review of Systems  Constitutional: Positive for unexpected weight change (weight gain). Negative for fever.  HENT: Negative.   Respiratory: Negative.   Cardiovascular: Negative.  Negative for palpitations and leg swelling.  Gastrointestinal: Negative.   Genitourinary: Negative.  Negative for hematuria.  Musculoskeletal: Positive for arthralgias (right shoulder pain). Myalgias: right foot pain/ ambulates with cane.  Skin: Negative.   Neurological: Negative.  Negative for facial asymmetry, light-headedness and numbness.  Hematological: Negative.   Psychiatric/Behavioral: Negative.        Objective:   Physical  Exam  Constitutional: She appears well-developed and well-nourished. She is active. She does not have a sickly appearance.  HENT:  Head: Normocephalic and atraumatic.  Nose: Mucosal edema present.  Eyes: Lids are normal.  Neck: Normal range of motion and full passive range of motion without pain. Neck supple.  Cardiovascular: Normal rate, regular rhythm, S1 normal, normal heart sounds and normal pulses.  Pulmonary/Chest: Effort normal and  breath sounds normal.  Abdominal: Soft. Normal appearance and bowel sounds are normal. There is no tenderness.  Musculoskeletal:       Right shoulder: She exhibits decreased range of motion, tenderness, swelling and pain.  Right great toe amputation  Lymphadenopathy:       Head (right side): No submental and no submandibular adenopathy present.       Head (left side): No submental and no submandibular adenopathy present.  Neurological: She is alert.  Skin: Skin is warm, dry and intact.  Psychiatric: Her speech is normal and behavior is normal. Judgment and thought content normal. Cognition and memory are normal.      BP 122/62 (BP Location: Right Arm, Patient Position: Sitting, Cuff Size: Large) Comment: manually  Pulse 64   Temp 98.3 F (36.8 C) (Oral)   Resp 16   Ht 5\' 4"  (1.626 m)   Wt 221 lb (100.2 kg)   SpO2 100%   BMI 37.93 kg/m  Assessment & Plan:  1. Uncontrolled type 2 diabetes mellitus with complication, with hyperglycemia (HCC) Hemoglobin a1C has increased from 7.4 to 12.2 over the past 3 months Hemoglobin A1C goal is less than 7. Your fasting blood sugar should be between 110-140.  Your LDL  (bad cholesterol goal is less than 100 Blood pressure goal is <140/90.  Recommend a lowfat, low carbohydrate diet divided over 5-6 small meals, increase water intake to 6-8 glasses, and 150 minutes per week of cardiovascular exercise.  - insulin aspart (NOVOLOG FLEXPEN) 100 UNIT/ML FlexPen; INJECT 6 UNITS INTO THE SKIN THREE TIMES DAILY WITH MEALS  Dispense: 3 pen; Refill: 3 - CMP and Liver Take your medications as prescribed Make sure that you are familiar with each one of your medications and what they are used to treat.  If you are unsure of medications, please bring to follow up - gabapentin (NEURONTIN) 300 MG capsule; Take 1 capsule (300 mg total) by mouth 4 (four) times daily.  Dispense: 120 capsule; Refill: 3 - HgB A1c - Insulin Glargine (LANTUS SOLOSTAR) 100 UNIT/ML  Solostar Pen; Inject 35 Units into the skin daily at 10 pm.  Dispense: 15 mL; Refill: 3  2. Chronic right shoulder pain Will defer to orthopedic services for further treatment and evaluation Reviewed [place] Substance Reporting system prior to prescribing opiate medications. No inconsistencies noted.    - acetaminophen-codeine (TYLENOL #3) 300-30 MG tablet; Take 1 tablet by mouth every 6 (six) hours as needed for moderate pain.  Dispense: 30 tablet; Refill: 0  3. Urine leukocytes - Urine Culture    RTC: 1 month for DMII   Nolon NationsLachina Moore Hollis  MSN, FNP-C Patient Care [facility] [address]  [place], [place] [id number] [phone]

## 2017-03-06 LAB — CMP AND LIVER
ALT: 10 IU/L (ref 0–32)
AST: 10 IU/L (ref 0–40)
Albumin: 3.6 g/dL (ref 3.6–4.8)
Alkaline Phosphatase: 206 IU/L — ABNORMAL HIGH (ref 39–117)
BILIRUBIN, DIRECT: 0.07 mg/dL (ref 0.00–0.40)
BUN: 27 mg/dL (ref 8–27)
CALCIUM: 9.2 mg/dL (ref 8.7–10.3)
CHLORIDE: 105 mmol/L (ref 96–106)
CO2: 25 mmol/L (ref 20–29)
Creatinine, Ser: 1.72 mg/dL — ABNORMAL HIGH (ref 0.57–1.00)
GFR calc non Af Amer: 31 mL/min/{1.73_m2} — ABNORMAL LOW (ref >59)
GFR, EST AFRICAN AMERICAN: 35 mL/min/{1.73_m2} — AB (ref >59)
Glucose: 195 mg/dL — ABNORMAL HIGH (ref 65–99)
Potassium: 5.1 mmol/L (ref 3.5–5.2)
Sodium: 142 mmol/L (ref 134–144)
Total Protein: 6.4 g/dL (ref 6.0–8.5)

## 2017-03-07 LAB — URINE CULTURE

## 2017-03-12 ENCOUNTER — Ambulatory Visit (INDEPENDENT_AMBULATORY_CARE_PROVIDER_SITE_OTHER): Payer: Medicare Other | Admitting: Physical Medicine and Rehabilitation

## 2017-03-12 ENCOUNTER — Encounter (INDEPENDENT_AMBULATORY_CARE_PROVIDER_SITE_OTHER): Payer: Self-pay | Admitting: Physical Medicine and Rehabilitation

## 2017-03-12 ENCOUNTER — Other Ambulatory Visit: Payer: Self-pay | Admitting: Family Medicine

## 2017-03-12 DIAGNOSIS — M79601 Pain in right arm: Secondary | ICD-10-CM

## 2017-03-12 DIAGNOSIS — E1140 Type 2 diabetes mellitus with diabetic neuropathy, unspecified: Secondary | ICD-10-CM

## 2017-03-12 DIAGNOSIS — R202 Paresthesia of skin: Secondary | ICD-10-CM

## 2017-03-12 DIAGNOSIS — K219 Gastro-esophageal reflux disease without esophagitis: Secondary | ICD-10-CM

## 2017-03-12 DIAGNOSIS — Z794 Long term (current) use of insulin: Secondary | ICD-10-CM

## 2017-03-12 NOTE — Progress Notes (Deleted)
Pt states constant sharp pain in neck that radiates down to her right arm and right hand, with numbness and tingling in right arm and right hand. Pt states symptoms started about 5 yrs ago and has gotten worse over time. Pt states normal activities makes it worse, nothing makes it better. Right hand dominant. No lotion or cream on hands.

## 2017-03-15 NOTE — Progress Notes (Signed)
Rachel Gonzalez - 67 y.o. female MRN [id number]  Date of birth: 09/07/1950  Office Visit Note: Visit Date: 03/12/2017 PCP: Massie Maroon, FNP Referred by: No ref. provider found  Subjective: Chief Complaint  Patient presents with  . Neck - Pain  . Right Hand - Numbness, Pain, Tingling  . Right Arm - Pain, Numbness, Tingling   HPI: Rachel Gonzalez is a 67 year old right-hand-dominant female with chronic worsening history of constant sharp neck pain that seems to radiate into the shoulder but with consistent shoulder pain worse with movement.  Reports that she gets right hand pain and numbness and tingling particularly in the middle 2 digits.  She denies left-sided symptoms.  She reports that it started about 5 years ago has gotten worse over time.  She reports that normal activities make everything worse.  Again she reports significant shoulder pain and really does not want to move the shoulder because of the pain.  She is followed by Dr. August Saucer already completed an MRI of the cervical spine which is reviewed below.  I have seen the patient for evaluation for possible epidural injection and those notes can be reviewed.  Epidural injection was not performed patient's symptoms and apprehension in general.  Cervical MRI shows some central protrusion by foraminal narrowing at C4-5 but this would not cause numbness and tingling into the middle digits particularly on the right.  Could give her some shoulder pain but should not be worse with shoulder movement.  Dr. August Saucer has ordered an MRI of the right shoulder.  I believe this was an arthrogram.  This is not been completed yet.  He also sent her to [place] for electrodiagnostic study of the right upper extremity.  She has a history of insulin-dependent diabetes with her last hemoglobin A1c being 12.  She has prior diagnosis of diabetic peripheral polyneuropathy.    ROS Otherwise per HPI.  Assessment & Plan: Visit Diagnoses:  1. Paresthesia of skin   2. Type  2 diabetes mellitus with diabetic neuropathy, with long-term current use of insulin (HCC)   3. Right arm pain     Plan: No additional findings.  Impression: The above electrodiagnostic study is ABNORMAL and reveals evidence of: 1.  A moderate to severe right median nerve entrapment at the wrist (carpal tunnel syndrome) affecting sensory and motor components.   2. Underlying diabetic peripheral neuropathy of the right upper extremity. Patient does have long-term, insulin dependent diabetes and numbness in the feet as well as prior diagnosis of neuropathy.   There is no significant electrodiagnostic evidence of any other brachial plexopathy or cervical radiculopathy.   As you know, this particular electrodiagnostic study cannot rule out chemical radiculitis or sensory only radiculopathy.  Recommendations: 1.  Follow-up with referring physician. 2.  Continue current management of symptoms. Question surgical evaluation.   Meds & Orders: No orders of the defined types were placed in this encounter.   Orders Placed This Encounter  Procedures  . NCV with EMG (electromyography)    Follow-up: Return for Dr. August Saucer.   Procedures: No procedures performed  EMG & NCV Findings: Evaluation of the right median motor nerve showed prolonged distal onset latency (5.2 ms), reduced amplitude (2.5 mV), and decreased conduction velocity (Elbow-Wrist, 39 m/s).  The right ulnar motor nerve showed decreased conduction velocity (B Elbow-Wrist, 41 m/s) and decreased conduction velocity (A Elbow-B Elbow, 42 m/s).  The right median (across palm) sensory nerve showed no response (Palm), prolonged distal peak latency (4.9 ms),  and reduced amplitude (7.3 V).  The right ulnar sensory nerve showed prolonged distal peak latency (7.1 ms), reduced amplitude (6.6 V), and decreased conduction velocity (Wrist-5th Digit, 20 m/s).  All remaining nerves (as indicated in the following tables) were within normal limits.    All  examined muscles (as indicated in the following table) showed no evidence of electrical instability.    Impression: The above electrodiagnostic study is ABNORMAL and reveals evidence of: 3.  A moderate to severe right median nerve entrapment at the wrist (carpal tunnel syndrome) affecting sensory and motor components.   4. Underlying diabetic peripheral neuropathy of the right upper extremity. Patient does have long-term, insulin dependent diabetes and numbness in the feet as well as prior diagnosis of neuropathy.   There is no significant electrodiagnostic evidence of any other brachial plexopathy or cervical radiculopathy.   As you know, this particular electrodiagnostic study cannot rule out chemical radiculitis or sensory only radiculopathy.  Recommendations: 1.  Follow-up with referring physician. 2.  Continue current management of symptoms. Question surgical evaluation.     Nerve Conduction Studies Anti Sensory Summary Table   Stim Site NR Peak (ms) Norm Peak (ms) P-T Amp (V) Norm P-T Amp Site1 Site2 Delta-P (ms) Dist (cm) Vel (m/s) Norm Vel (m/s)  Right Median Acr Palm Anti Sensory (2nd Digit)  32.3C  Wrist    *4.9 <3.6 *7.3 >10 Wrist Palm  0.0    Palm *NR  <2.0          Right Radial Anti Sensory (Base 1st Digit)  32.6C  Wrist    2.4 <3.1 6.9  Wrist Base 1st Digit 2.4 0.0    Right Ulnar Anti Sensory (5th Digit)  33C  Wrist    *7.1 <3.7 *6.6 >15.0 Wrist 5th Digit 7.1 14.0 *20 >38   Motor Summary Table   Stim Site NR Onset (ms) Norm Onset (ms) O-P Amp (mV) Norm O-P Amp Site1 Site2 Delta-0 (ms) Dist (cm) Vel (m/s) Norm Vel (m/s)  Right Median Motor (Abd Poll Brev)  32C  Wrist    *5.2 <4.2 *2.5 >5 Elbow Wrist 5.3 20.5 *39 >50  Elbow    10.5  0.6         Right Ulnar Motor (Abd Dig Min)  31.3C  Wrist    4.0 <4.2 3.5 >3 B Elbow Wrist 5.1 21.0 *41 >53  B Elbow    9.1  0.5  A Elbow B Elbow 2.4 10.0 *42 >53  A Elbow    11.5  0.1  Axilla A Elbow 2.5 0.0    Axilla    9.0   0.5          EMG   Side Muscle Nerve Root Ins Act Fibs Psw Amp Dur Poly Recrt Int Dennie Bible Comment  Right 1stDorInt Ulnar C8-T1 Nml Nml Nml Nml Nml 0 Nml Nml   Right Abd Poll Brev Median C8-T1 Nml 1+ 1+ Nml Nml 0 Nml Nml   Right ExtDigCom   Nml Nml Nml Nml Nml 0 Nml Nml   Right Triceps Radial C6-7-8 Nml Nml Nml Nml Nml 0 Nml Nml   Right Deltoid Axillary C5-6 Nml Nml Nml Nml Nml 0 Nml Nml     Nerve Conduction Studies Anti Sensory Left/Right Comparison   Stim Site L Lat (ms) R Lat (ms) L-R Lat (ms) L Amp (V) R Amp (V) L-R Amp (%) Site1 Site2 L Vel (m/s) R Vel (m/s) L-R Vel (m/s)  Median Acr Palm Anti Sensory (2nd Digit)  32.3C  Wrist  *4.9   *7.3  Wrist Palm     Palm             Radial Anti Sensory (Base 1st Digit)  32.6C  Wrist  2.4   6.9  Wrist Base 1st Digit     Ulnar Anti Sensory (5th Digit)  33C  Wrist  *7.1   *6.6  Wrist 5th Digit  *20    Motor Left/Right Comparison   Stim Site L Lat (ms) R Lat (ms) L-R Lat (ms) L Amp (mV) R Amp (mV) L-R Amp (%) Site1 Site2 L Vel (m/s) R Vel (m/s) L-R Vel (m/s)  Median Motor (Abd Poll Brev)  32C  Wrist  *5.2   *2.5  Elbow Wrist  *39   Elbow  10.5   0.6        Ulnar Motor (Abd Dig Min)  31.3C  Wrist  4.0   3.5  B Elbow Wrist  *41   B Elbow  9.1   0.5  A Elbow B Elbow  *42   A Elbow  11.5   0.1  Axilla A Elbow     Axilla  9.0   0.5           Waveforms:            Clinical History: MRI CERVICAL SPINE WITHOUT CONTRAST  TECHNIQUE: Multiplanar, multisequence MR imaging of the cervical spine was performed. No intravenous contrast was administered.  COMPARISON:  None.  FINDINGS: Alignment: Reversal of the normal cervical lordotic curve. This could be positional or due to spasm. No subluxation.  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 No worrisome osseous lesion.  Cord: Minimal flattening at C4-5.  No abnormal cord signal.  Posterior Fossa, vertebral arteries,  paraspinal tissues: Unremarkable. Kissing carotid arteries in the midline retropharynx.  Disc levels:  C2-3:  Normal.  C3-4:  Annular bulge.  No impingement.  C4-5: Central protrusion. Effacement anterior subarachnoid space with minimal flattening. BILATERAL C5 foraminal narrowing.  C5-6:  Annular bulge.  No impingement.  C6-7:  Hypoplastic.  C7-T1:  Normal.  IMPRESSION: Central protrusion at C4-5. BILATERAL C5 foraminal narrowing. Correlate clinically for RIGHT arm radicular symptoms. Mild stenosis and minimal cord flattening without abnormal cord signal.  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   Electronically Signed   By: Elsie StainJohn T Curnes M.D.   On: 09/21/2016 14:51  She reports that  has never smoked. she has never used smokeless tobacco.  Recent Labs    08/27/16 0952 11/30/16 1113 03/05/17 1227  HGBA1C 11.9 7.4 12.2    Objective:  VS:  HT:    WT:   BMI:     BP:   HR: bpm  TEMP: ( )  RESP:  Physical Exam  Musculoskeletal:  Painful right shoulder with any type of shoulder motion.  Inspection reveals flattening of the right APB but no atrophy of the bilateral APB or FDI or hand intrinsics. There is no swelling, color changes, allodynia or dystrophic changes. There is 5 out of 5 strength in the bilateral wrist extension, finger abduction and long finger flexion.  There is decreased sensation to light touch subjectively in the middle digits on the right. There is a negative Tinel's test at the bilateral wrist and elbow. There is a negative Phalen's test bilaterally. There is a negative Hoffmann's test bilaterally.    Ortho Exam Imaging: No results found.  Past  Medical/Family/Surgical/Social History: Medications & Allergies reviewed per EMR Patient Active Problem List   Diagnosis Date Noted  . Stage 3 chronic kidney disease (HCC) 12/01/2016  . Frequent falls 04/28/2016  . Right arm weakness 04/28/2016  .  Bilateral lower extremity edema 04/28/2016  . Screening for colon cancer 04/28/2016  . Abnormal urinalysis 09/13/2014  . Chest pain 09/13/2014  . Abdominal pain, recurrent 09/13/2014  . Dehydration with hyponatremia 09/13/2014  . Acute hyperkalemia 09/13/2014  . Acute renal failure superimposed on stage 3 chronic kidney disease (HCC) 09/13/2014  . Anemia 05/06/2014  . Diabetic neuropathy, type II diabetes mellitus (HCC) 05/06/2014  . Obesity (BMI 30-39.9) 05/06/2014  . Cellulitis of right foot 05/06/2014  . Leg swelling 03/31/2011  . Essential hypertension 03/07/2011  . 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 Right arm pain 08/04/2006  . Osteoarthritis 02/14/2006  . Diabetes mellitus type 2, uncontrolled (HCC) 01/11/2006   Past Medical History:  Diagnosis Date  . Anemia   . Diabetes mellitus   . Hypertension   . Renal disorder    Family History  Problem Relation Age of Onset  . Diabetes Mother    Past Surgical History:  Procedure Laterality Date  . AMPUTATION Right 05/08/2014   Procedure: AMPUTATION RAY, right great toe;  Surgeon: Nadara Mustard, MD;  Location: [facility] OR;  Service: Orthopedics;  Laterality: Right;  . arm surgery     . CESAREAN SECTION    . I&D EXTREMITY Right 05/08/2014   Procedure: IRRIGATION AND DEBRIDEMENT FOOT;  Surgeon: Nadara Mustard, MD;  Location: [facility] OR;  Service: Orthopedics;  Laterality: Right;   Social History   Occupational History  . Not on file  Tobacco Use  . Smoking status: Never Smoker  . Smokeless tobacco: Never Used  Substance and Sexual Activity  . Alcohol use: No  . Drug use: No  . Sexual activity: Not on file

## 2017-03-15 NOTE — Procedures (Signed)
EMG & NCV Findings: Evaluation of the right median motor nerve showed prolonged distal onset latency (5.2 ms), reduced amplitude (2.5 mV), and decreased conduction velocity (Elbow-Wrist, 39 m/s).  The right ulnar motor nerve showed decreased conduction velocity (B Elbow-Wrist, 41 m/s) and decreased conduction velocity (A Elbow-B Elbow, 42 m/s).  The right median (across palm) sensory nerve showed no response (Palm), prolonged distal peak latency (4.9 ms), and reduced amplitude (7.3 V).  The right ulnar sensory nerve showed prolonged distal peak latency (7.1 ms), reduced amplitude (6.6 V), and decreased conduction velocity (Wrist-5th Digit, 20 m/s).  All remaining nerves (as indicated in the following tables) were within normal limits.    All examined muscles (as indicated in the following table) showed no evidence of electrical instability.    Impression: The above electrodiagnostic study is ABNORMAL and reveals evidence of: 1.  A moderate to severe right median nerve entrapment at the wrist (carpal tunnel syndrome) affecting sensory and motor components.   2. Underlying diabetic peripheral neuropathy of the right upper extremity. Patient does have long-term, insulin dependent diabetes and numbness in the feet as well as prior diagnosis of neuropathy.   There is no significant electrodiagnostic evidence of any other brachial plexopathy or cervical radiculopathy.   As you know, this particular electrodiagnostic study cannot rule out chemical radiculitis or sensory only radiculopathy.  Recommendations: 1.  Follow-up with referring physician. 2.  Continue current management of symptoms. Question surgical evaluation.     Nerve Conduction Studies Anti Sensory Summary Table   Stim Site NR Peak (ms) Norm Peak (ms) P-T Amp (V) Norm P-T Amp Site1 Site2 Delta-P (ms) Dist (cm) Vel (m/s) Norm Vel (m/s)  Right Median Acr Palm Anti Sensory (2nd Digit)  32.3C  Wrist    *4.9 <3.6 *7.3 >10 Wrist Palm   0.0    Palm *NR  <2.0          Right Radial Anti Sensory (Base 1st Digit)  32.6C  Wrist    2.4 <3.1 6.9  Wrist Base 1st Digit 2.4 0.0    Right Ulnar Anti Sensory (5th Digit)  33C  Wrist    *7.1 <3.7 *6.6 >15.0 Wrist 5th Digit 7.1 14.0 *20 >38   Motor Summary Table   Stim Site NR Onset (ms) Norm Onset (ms) O-P Amp (mV) Norm O-P Amp Site1 Site2 Delta-0 (ms) Dist (cm) Vel (m/s) Norm Vel (m/s)  Right Median Motor (Abd Poll Brev)  32C  Wrist    *5.2 <4.2 *2.5 >5 Elbow Wrist 5.3 20.5 *39 >50  Elbow    10.5  0.6         Right Ulnar Motor (Abd Dig Min)  31.3C  Wrist    4.0 <4.2 3.5 >3 B Elbow Wrist 5.1 21.0 *41 >53  B Elbow    9.1  0.5  A Elbow B Elbow 2.4 10.0 *42 >53  A Elbow    11.5  0.1  Axilla A Elbow 2.5 0.0    Axilla    9.0  0.5          EMG   Side Muscle Nerve Root Ins Act Fibs Psw Amp Dur Poly Recrt Int Dennie BiblePat Comment  Right 1stDorInt Ulnar C8-T1 Nml Nml Nml Nml Nml 0 Nml Nml   Right Abd Poll Brev Median C8-T1 Nml 1+ 1+ Nml Nml 0 Nml Nml   Right ExtDigCom   Nml Nml Nml Nml Nml 0 Nml Nml   Right Triceps Radial C6-7-8 Nml Nml Nml Nml Nml  0 Nml Nml   Right Deltoid Axillary C5-6 Nml Nml Nml Nml Nml 0 Nml Nml     Nerve Conduction Studies Anti Sensory Left/Right Comparison   Stim Site L Lat (ms) R Lat (ms) L-R Lat (ms) L Amp (V) R Amp (V) L-R Amp (%) Site1 Site2 L Vel (m/s) R Vel (m/s) L-R Vel (m/s)  Median Acr Palm Anti Sensory (2nd Digit)  32.3C  Wrist  *4.9   *7.3  Wrist Palm     Palm             Radial Anti Sensory (Base 1st Digit)  32.6C  Wrist  2.4   6.9  Wrist Base 1st Digit     Ulnar Anti Sensory (5th Digit)  33C  Wrist  *7.1   *6.6  Wrist 5th Digit  *20    Motor Left/Right Comparison   Stim Site L Lat (ms) R Lat (ms) L-R Lat (ms) L Amp (mV) R Amp (mV) L-R Amp (%) Site1 Site2 L Vel (m/s) R Vel (m/s) L-R Vel (m/s)  Median Motor (Abd Poll Brev)  32C  Wrist  *5.2   *2.5  Elbow Wrist  *39   Elbow  10.5   0.6        Ulnar Motor (Abd Dig Min)  31.3C  Wrist  4.0    3.5  B Elbow Wrist  *41   B Elbow  9.1   0.5  A Elbow B Elbow  *42   A Elbow  11.5   0.1  Axilla A Elbow     Axilla  9.0   0.5           Waveforms:

## 2017-03-16 ENCOUNTER — Ambulatory Visit
Admission: RE | Admit: 2017-03-16 | Discharge: 2017-03-16 | Disposition: A | Discharge status: Home / Self Care | Payer: Medicaid Other | Source: Ambulatory Visit | Attending: Orthopedic Surgery | Admitting: Orthopedic Surgery

## 2017-03-16 DIAGNOSIS — M25511 Pain in right shoulder: Principal | ICD-10-CM

## 2017-03-16 DIAGNOSIS — M67813 Other specified disorders of tendon, right shoulder: Secondary | ICD-10-CM | POA: Diagnosis not present

## 2017-03-16 DIAGNOSIS — G8929 Other chronic pain: Secondary | ICD-10-CM

## 2017-03-16 MED ORDER — IOPAMIDOL (ISOVUE-M 200) INJECTION 41%
15.0000 mL | Freq: Once | INTRAMUSCULAR | Status: AC
  Administered 2017-03-16: 15 mL via INTRA_ARTICULAR

## 2017-03-18 ENCOUNTER — Encounter (INDEPENDENT_AMBULATORY_CARE_PROVIDER_SITE_OTHER): Payer: Self-pay | Admitting: Orthopedic Surgery

## 2017-03-18 ENCOUNTER — Ambulatory Visit (INDEPENDENT_AMBULATORY_CARE_PROVIDER_SITE_OTHER): Payer: Medicare Other | Admitting: Orthopedic Surgery

## 2017-03-18 DIAGNOSIS — M7541 Impingement syndrome of right shoulder: Secondary | ICD-10-CM

## 2017-03-19 ENCOUNTER — Encounter (INDEPENDENT_AMBULATORY_CARE_PROVIDER_SITE_OTHER): Payer: Self-pay | Admitting: Orthopedic Surgery

## 2017-03-19 NOTE — Progress Notes (Signed)
Office Visit Note   Patient: Rachel Gonzalez           Date of Birth: 03-21-1950           MRN: [id number] Visit Date: 03/18/2017 Requested by: No referring provider defined for this encounter. PCP: Massie MaroonHollis, Lachina M, FNP  Subjective: Chief Complaint  Patient presents with  . Right Shoulder - Follow-up    HPI: Rachel Gonzalez is a 67 year old patient with right shoulder pain.  Since I have seen her she has had an MRI scan which is reviewed.  She has supraspinatus and infraspinatus tendinopathy but no rotator cuff tear.  The pain will wake her from sleep at times.  She does take Tylenol 3 for the problem.  MRI scan also shows biceps tendinopathy.  She does not really want to pursue surgical intervention.              ROS: All systems reviewed are negative as they relate to the chief complaint within the history of present illness.  Patient denies  fevers or chills.   Assessment & Plan: Visit Diagnoses:  1. Impingement syndrome of right shoulder     Plan: Impression is impingement syndrome and tendinitis of the rotator cuff tendons and the biceps tendon.  Plan is physical therapy with consideration of injection in the future.  I do not think it is an operative problem yet and she wants to live with what she has.  We will see her back  Follow-Up Instructions: Return if symptoms worsen or fail to improve.   Orders:  No orders of the defined types were placed in this encounter.  No orders of the defined types were placed in this encounter.     Procedures: No procedures performed   Clinical Data: No additional findings.  Objective: Vital Signs: There were no vitals taken for this visit.  Physical Exam:   Constitutional: Patient appears well-developed HEENT:  Head: Normocephalic Eyes:EOM are normal Neck: Normal range of motion Cardiovascular: Normal rate Pulmonary/chest: Effort normal Neurologic: Patient is alert Skin: Skin is warm Psychiatric: Patient has normal mood and  affect    Ortho Exam: Orthopedic exam demonstrates full active and passive range of motion of the right shoulder with no restriction of external rotation at 50 degrees of abduction.  It is a little bit tougher to get her into full forward flexion on the right than the left.  She may have an early frozen shoulder but it does not really look like the inferior capsule as thickening on the MRI scan.  External rotation is symmetric but again slightly painful on the right compared to the left.  Rotator cuff strength is intact to infraspinatus supraspinatus and subscap muscle testing.  Negative O'Brien's testing on the right and no AC joint tenderness on the right  Specialty Comments:  No specialty comments available.  Imaging: No results found.   PMFS History: Patient Active Problem List   Diagnosis Date Noted  . Stage 3 chronic kidney disease (HCC) 12/01/2016  . Frequent falls 04/28/2016  . Right arm weakness 04/28/2016  . Bilateral lower extremity edema 04/28/2016  . Screening for colon cancer 04/28/2016  . Abnormal urinalysis 09/13/2014  . Chest pain 09/13/2014  . Abdominal pain, recurrent 09/13/2014  . Dehydration with hyponatremia 09/13/2014  . Acute hyperkalemia 09/13/2014  . Acute renal failure superimposed on stage 3 chronic kidney disease (HCC) 09/13/2014  . Anemia 05/06/2014  . Diabetic neuropathy, type II diabetes mellitus (HCC) 05/06/2014  . Obesity (BMI 30-39.9)  05/06/2014  . Cellulitis of right foot 05/06/2014  . Leg swelling 03/31/2011  . Essential hypertension 03/07/2011  . 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 Right arm pain 08/04/2006  . Osteoarthritis 02/14/2006  . Diabetes mellitus type 2, uncontrolled (HCC) 01/11/2006   Past Medical History:  Diagnosis Date  . Anemia   . Diabetes mellitus   . Hypertension   . Renal disorder     Family History  Problem Relation Age of Onset  . Diabetes Mother     Past Surgical History:  Procedure  Laterality Date  . AMPUTATION Right 05/08/2014   Procedure: AMPUTATION RAY, right great toe;  Surgeon: Nadara Mustard, MD;  Location: [facility] OR;  Service: Orthopedics;  Laterality: Right;  . arm surgery     . CESAREAN SECTION    . I&D EXTREMITY Right 05/08/2014   Procedure: IRRIGATION AND DEBRIDEMENT FOOT;  Surgeon: Nadara Mustard, MD;  Location: [facility] OR;  Service: Orthopedics;  Laterality: Right;   Social History   Occupational History  . Not on file  Tobacco Use  . Smoking status: Never Smoker  . Smokeless tobacco: Never Used  Substance and Sexual Activity  . Alcohol use: No  . Drug use: No  . Sexual activity: Not on file

## 2017-03-29 ENCOUNTER — Ambulatory Visit: Payer: Medicare Other | Attending: Physical Medicine and Rehabilitation | Admitting: Physical Therapy

## 2017-03-29 ENCOUNTER — Other Ambulatory Visit: Payer: Self-pay

## 2017-03-29 ENCOUNTER — Encounter: Payer: Self-pay | Admitting: Physical Therapy

## 2017-03-29 DIAGNOSIS — G8929 Other chronic pain: Secondary | ICD-10-CM

## 2017-03-29 DIAGNOSIS — M25611 Stiffness of right shoulder, not elsewhere classified: Secondary | ICD-10-CM | POA: Insufficient documentation

## 2017-03-29 DIAGNOSIS — M6281 Muscle weakness (generalized): Secondary | ICD-10-CM

## 2017-03-29 DIAGNOSIS — R293 Abnormal posture: Secondary | ICD-10-CM | POA: Diagnosis not present

## 2017-03-29 DIAGNOSIS — M25511 Pain in right shoulder: Secondary | ICD-10-CM | POA: Insufficient documentation

## 2017-03-29 NOTE — Therapy (Signed)
[facility] [address] [place], [place], [address] Phone: [phone]   Fax:  [phone]  Physical Therapy Evaluation  Patient Details  Name: Rachel HalfCarol A Pollett MRN: [id number] Date of Birth: 02/27/1951 Referring Provider: Cammy CopaGregory Scott Dean MD   Encounter Date: 03/29/2017  PT End of Session - 03/29/17 1733    Visit Number  1    Number of Visits  13    Date for PT Re-Evaluation  05/10/17    Authorization Type  [employer] (resubmit at 3rd or 4th visit)    PT Start Time  1547    PT Stop Time  1630    PT Time Calculation (min)  43 min    Activity Tolerance  Patient tolerated treatment well;Patient limited by pain    Behavior During Therapy  [facility] for tasks assessed/performed       Past Medical History:  Diagnosis Date  . Anemia   . Diabetes mellitus   . Hypertension   . Renal disorder     Past Surgical History:  Procedure Laterality Date  . AMPUTATION Right 05/08/2014   Procedure: AMPUTATION RAY, right great toe;  Surgeon: Nadara MustardMarcus Duda V, MD;  Location: [facility] OR;  Service: Orthopedics;  Laterality: Right;  . arm surgery     . CESAREAN SECTION    . I&D EXTREMITY Right 05/08/2014   Procedure: IRRIGATION AND DEBRIDEMENT FOOT;  Surgeon: Nadara MustardMarcus Duda V, MD;  Location: [facility] OR;  Service: Orthopedics;  Laterality: Right;    There were no vitals filed for this visit.   Subjective Assessment - 03/29/17 1554    Subjective  pt is a 67 y.o F with CC of R shoulder that has been going on for a while atleast a few years and reports there is no specific onset. reports pain starts in the shoulder and radiates down to the wrist, pt denies any N/T. since onset the pain seems to be worsening. Reports she has been getting occasional HA. She reported an injection in the shoulder 03/16/2017 reporting improvement of pain but it didn't last    Pertinent History  diabetes, HTN, renal, anemia     Limitations  Lifting;Other (comment) bathing/ dressing    How long can  you sit comfortably?  30 min    How long can you stand comfortably?  10 min    How long can you walk comfortably?  5 min    Diagnostic tests  MRI on 03/17/2017    Patient Stated Goals  to get better, decrease pain, improve motion, to be able to bath and don clothing     Currently in Pain?  Yes    Pain Score  8  at worst 10/10.    Pain Location  Shoulder    Pain Orientation  Right;Anterior;Upper    Pain Descriptors / Indicators  Nagging;Sharp    Pain Type  Chronic pain    Pain Onset  More than a month ago    Pain Frequency  Constant    Aggravating Factors   lifting, using the arm alot, lifting overhead    Pain Relieving Factors  medication,          [facility] PT Assessment - 03/29/17 1600      Assessment   Medical Diagnosis  R shoulder tendiopathy of RC and bicep     Referring Provider  Cammy CopaGregory Scott Dean MD    Onset Date/Surgical Date  -- many years    Hand Dominance  Right    Next MD  Visit  make one as needed    Prior Therapy  yes      Precautions   Precautions  None      Restrictions   Weight Bearing Restrictions  No      Balance Screen   Has the patient fallen in the past 6 months  Yes    How many times?  3    Has the patient had a decrease in activity level because of a fear of falling?   Yes    Is the patient reluctant to leave their home because of a fear of falling?   Yes      Home Environment   Living Environment  Private residence    Living Arrangements  Spouse/significant other    Available Help at Discharge  Family    Type of Home  House    Home Access  Stairs to enter    Entrance Stairs-Number of Steps  4    Entrance Stairs-Rails  Right    Home Layout  One level      Prior Function   Level of Independence  Needs assistance with ADLs    Dressing  Moderate    Grooming  Moderate    Vocation  Retired      IT consultant   Overall Cognitive Status  Within Functional Limits for tasks assessed      Observation/Other Assessments   Focus on Therapeutic Outcomes  (FOTO)   Medicaid      Posture/Postural Control   Posture/Postural Control  Postural limitations    Postural Limitations  Rounded Shoulders;Forward head;Increased thoracic kyphosis      ROM / Strength   AROM / PROM / Strength  AROM;PROM;Strength      AROM   AROM Assessment Site  Shoulder    Right/Left Shoulder  Right;Left    Right Shoulder Extension  21 Degrees    Right Shoulder Flexion  70 Degrees Pain during movement    Right Shoulder ABduction  58 Degrees    Right Shoulder Internal Rotation  48 Degrees assessed with shoulder in neutral, to stomach    Right Shoulder External Rotation  8 Degrees assessed with shoulder in neutral    Left Shoulder Extension  30 Degrees    Left Shoulder Flexion  130 Degrees    Left Shoulder ABduction  68 Degrees    Left Shoulder Internal Rotation  28 Degrees assessed in max abduction (68 degrees)    Left Shoulder External Rotation  42 Degrees assessed in max abduction (68 degrees)      PROM   Overall PROM Comments  significant guarding during PROM    PROM Assessment Site  Shoulder    Right/Left Shoulder  Right;Left    Right Shoulder Flexion  45 Degrees    Right Shoulder ABduction  52 Degrees    Right Shoulder Internal Rotation  70 Degrees with abduction to 20 degrees    Right Shoulder External Rotation  10 Degrees 20 degrees of abduction      Strength   Strength Assessment Site  Shoulder;Hand    Right/Left Shoulder  Right;Left    Right Shoulder Flexion  3-/5    Right Shoulder Extension  3-/5    Right Shoulder ABduction  3-/5    Right Shoulder Internal Rotation  2+/5    Right Shoulder External Rotation  2+/5    Left Shoulder Flexion  4-/5    Left Shoulder Extension  4/5    Left Shoulder ABduction  4-/5    Left  Shoulder Internal Rotation  3+/5    Left Shoulder External Rotation  3+/5    Right Hand Grip (lbs)  5.3 5,4,7    Left Hand Grip (lbs)  29.3 28,37,23      Palpation   Palpation comment  TTP along the distal clavicle, supra/  infraspinatus, proximal bicep and tightness inthe upper trap      Special Tests    Special Tests  Rotator Cuff Impingement    Rotator Cuff Impingment tests  Neer impingement test;Hawkins- Kennedy test;other;Painful Arc of Motion;Empty Can test;Full Can test      other   Comments  scapular assist              Objective measurements completed on examination: See above findings.              PT Education - 03/29/17 1732    Education provided  Yes    Education Details  evaluation findings, POC, goals, HEP with form/rationale, anatomy of area involved    Person(s) Educated  Patient    Methods  Explanation;Verbal cues;Handout    Comprehension  Verbalized understanding;Verbal cues required       PT Short Term Goals - 03/29/17 1739      PT SHORT TERM GOAL #1   Title  pt will be I with initial HEP    Baseline  no previous HEP    Time  3    Period  Weeks    Status  New    Target Date  04/19/17      PT SHORT TERM GOAL #2   Title  pt will be able to verbalize/ demo techniques to reduce pain and inflammation via RICE and HEP    Baseline  only used medication to calm pain    Time  3    Period  Weeks    Status  New    Target Date  04/19/17      PT SHORT TERM GOAL #3   Title  pt to increase R grip strength by >/= 8# to demo improvement of R shoulder function     Baseline  5.3#    Time  3    Period  Weeks    Status  New    Target Date  04/19/17      PT SHORT TERM GOAL #4   Title  increase PROM shoulder flexion/ abduction to >/= 70 degrees to promtoe shoulder mobility     Baseline  PROM flexion 45 degrees and 52 abduction with significant guarding    Time  3    Period  Weeks    Status  New    Target Date  04/19/17        PT Long Term Goals - 03/29/17 1742      PT LONG TERM GOAL #1   Title  pt to increase R shoulder AROM flexion to >/= 100 degrees and IR/ER to >/= 60 degree with </= 4/10 pain  for functional mobility required for ADLs and personal hygiene     Baseline  70 degrees of flexion 58 degrees of abduction 48 degrees of internal rotation and 8 degrees of external rotaiton with pain rated at 7/10    Time  6    Period  Weeks    Status  New    Target Date  05/10/17      PT LONG TERM GOAL #2   Title  increase R shoulder strength to >/= 4-/5 to promote functional stability with lifting and  carrying activities associated with ADLs    Baseline  flexion 3-/5, abduction 3-/5, IR 3-/5, ER 2+/5    Time  6    Period  Weeks    Status  New    Target Date  05/10/17      PT LONG TERM GOAL #3   Title  pt to be able to lift/ lower >/= 8# from an overhead shelf with </= 4/10 pain to promote functional liflting and carrying activities    Baseline  unable to reach and overhead shelf withRUE    Time  6    Period  Weeks    Status  New    Target Date  05/10/17      PT LONG TERM GOAL #4   Title  pt to be I with all HEP given as of last visit to [facility] and progress current level of function    Baseline  no prevous HEP     Time  6    Period  Weeks    Status  New    Target Date  05/10/17             Plan - 03/29/17 1733    Clinical Impression Statement  Pt presents to OPPT with CC of R shoulder pain starting a few years ago with non-speicific onset. limited shoulder AROM/PROM as well as strength due to reported pain. pt exhibits minimal visual indication of pain but demonstrates signifcant guarding of R shoulder. tenderness inthe infra/suprasinatus, distal calvicle, and proximal bicep attachment,  tightness inthe upper trap. She would benefit from physical therapy to decrease R shoulder pain, improve mobility and strength and maximize her function by addressing the deficits listed    History and Personal Factors relevant to plan of care:  hx of Anemia, HTN, and DM, age    Clinical Presentation  Evolving    Clinical Presentation due to:  chronic shoulder pain that is increasing, limited shoulder ROM, limited strength    Clinical Decision Making   Moderate    Rehab Potential  Good    PT Frequency  1x / week    PT Duration  3 weeks progressing to 2 x a week for 6 weeks follwing inital Medicaid authorization    PT Treatment/Interventions  ADLs/Self Care Home Management;Neuromuscular re-education;Passive range of motion;Manual techniques;Dry needling;Patient/family education;Traction;Ultrasound;Cryotherapy;Electrical Stimulation;Therapeutic exercise;Iontophoresis 4mg /ml Dexamethasone;Moist Heat;Therapeutic activities;Functional mobility training;Taping    PT Next Visit Plan  review and update HEP, shoulder PROM/ AAROM, scapular stability, gripping exercise    PT Home Exercise Plan  shoulder table slides flexion/ abduction, scapular setting, upper trap stretching, wand ER    Consulted and Agree with Plan of Care  Patient       Patient will benefit from skilled therapeutic intervention in order to improve the following deficits and impairments:  Pain, Impaired UE functional use, Increased fascial restricitons, Increased muscle spasms, Postural dysfunction, Improper body mechanics, Decreased range of motion, Decreased activity tolerance, Decreased knowledge of precautions, Decreased endurance, Decreased strength  Visit Diagnosis: Chronic right shoulder pain  Muscle weakness (generalized)  Abnormal posture  Stiffness of right shoulder, not elsewhere classified     Problem List Patient Active Problem List   Diagnosis Date Noted  . Stage 3 chronic kidney disease (HCC) 12/01/2016  . Frequent falls 04/28/2016  . Right arm weakness 04/28/2016  . Bilateral lower extremity edema 04/28/2016  . Screening for colon cancer 04/28/2016  . Abnormal urinalysis 09/13/2014  . Chest pain 09/13/2014  . Abdominal pain, recurrent 09/13/2014  .  Dehydration with hyponatremia 09/13/2014  . Acute hyperkalemia 09/13/2014  . Acute renal failure superimposed on stage 3 chronic kidney disease (HCC) 09/13/2014  . Anemia 05/06/2014  . Diabetic neuropathy,  type II diabetes mellitus (HCC) 05/06/2014  . Obesity (BMI 30-39.9) 05/06/2014  . Cellulitis of right foot 05/06/2014  . Leg swelling 03/31/2011  . Essential hypertension 03/07/2011  . 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 Right arm pain 08/04/2006  . Osteoarthritis 02/14/2006  . Diabetes mellitus type 2, uncontrolled (HCC) 01/11/2006   Lulu Riding PT, DPT, LAT, ATC  03/29/17  5:47 PM      [facility] [address] [place], [name], [address] Phone: [phone]   Fax:  [phone]  Name: DANELY BAYLISS MRN: [id number] Date of Birth: [birth year]

## 2017-04-01 ENCOUNTER — Encounter: Payer: Self-pay | Admitting: Family Medicine

## 2017-04-01 ENCOUNTER — Ambulatory Visit: Payer: Medicare Other | Admitting: Family Medicine

## 2017-04-01 VITALS — BP 136/57 | HR 78 | Temp 98.4°F | Resp 16 | Ht 64.0 in | Wt 228.0 lb

## 2017-04-01 DIAGNOSIS — L304 Erythema intertrigo: Secondary | ICD-10-CM

## 2017-04-01 DIAGNOSIS — R8290 Unspecified abnormal findings in urine: Secondary | ICD-10-CM | POA: Diagnosis not present

## 2017-04-01 DIAGNOSIS — Z794 Long term (current) use of insulin: Secondary | ICD-10-CM | POA: Diagnosis not present

## 2017-04-01 DIAGNOSIS — I10 Essential (primary) hypertension: Secondary | ICD-10-CM | POA: Diagnosis not present

## 2017-04-01 DIAGNOSIS — N61 Mastitis without abscess: Secondary | ICD-10-CM

## 2017-04-01 DIAGNOSIS — E1140 Type 2 diabetes mellitus with diabetic neuropathy, unspecified: Secondary | ICD-10-CM | POA: Diagnosis not present

## 2017-04-01 LAB — POCT URINALYSIS DIP (DEVICE)
BILIRUBIN URINE: NEGATIVE
GLUCOSE, UA: NEGATIVE mg/dL
KETONES UR: NEGATIVE mg/dL
Nitrite: NEGATIVE
PH: 5.5 (ref 5.0–8.0)
Protein, ur: 30 mg/dL — AB
Specific Gravity, Urine: 1.015 (ref 1.005–1.030)
Urobilinogen, UA: 0.2 mg/dL (ref 0.0–1.0)

## 2017-04-01 LAB — GLUCOSE, CAPILLARY: GLUCOSE-CAPILLARY: 91 mg/dL (ref 65–99)

## 2017-04-01 LAB — POCT GLYCOSYLATED HEMOGLOBIN (HGB A1C): HEMOGLOBIN A1C: 11.5

## 2017-04-01 MED ORDER — SULFAMETHOXAZOLE-TRIMETHOPRIM 800-160 MG PO TABS: 1.0000 | ORAL_TABLET | Freq: Two times a day (BID) | ORAL | 0 refills | Status: AC

## 2017-04-01 MED ORDER — NYSTATIN 100000 UNIT/GM EX POWD: Freq: Four times a day (QID) | CUTANEOUS | 5 refills | Status: DC

## 2017-04-01 NOTE — Patient Instructions (Signed)
No medication changes warranted for type 2 diabetes.  Hemoglobin A1c is 11.2 which is decreased from previous value. You have intertrigo, related to type 2 diabetes mellitus.  We will start a trial of nystatin apply powder to clean dry skin 3-4 times per day as needed   You also have cellulitis to left breast.  Will start Bactrim 800-160 mg twice daily for 7 days.  Please contact clinic if condition worsens.   Intertrigo Intertrigo is skin irritation (inflammation) that happens in warm, moist areas of the body. The irritation can cause a rash and make skin raw and itchy. The rash is usually pink or red. It happens mostly between folds of skin or where skin rubs together, such as:  Toes.  Armpits.  Groin.  Belly.  Breasts.  Buttocks.  This condition is not passed from person to person (is not contagious). Follow these instructions at home:  Keep the affected area clean and dry.  Do not scratch your skin.  Stay cool as much as possible. Use an air conditioner or fan, if you can.  Apply over-the-counter and prescription medicines only as told by your doctor.  If you were prescribed an antibiotic medicine, use it as told by your doctor. Do not stop using the antibiotic even if your condition starts to get better.  Keep all follow-up visits as told by your doctor. This is important. How is this prevented?  Stay at a healthy weight.  Keep your feet dry. This is very important if you have diabetes. Wear cotton or wool socks.  Take care of and protect the skin in your groin and butt area as told by your doctor.  Do not wear tight clothes. Wear clothes that: ? Are loose. ? Take away moisture from your body. ? Are made of cotton.  Wear a bra that gives good support, if needed.  Shower and dry yourself fully after being active.  Keep your blood sugar under control if you have diabetes. Contact a doctor if:  Your symptoms do not get better with treatment.  Your symptoms  get worse or they spread.  You notice more redness and warmth.  You have a fever. This information is not intended to replace advice given to you by your health care provider. Make sure you discuss any questions you have with your health care provider. Document Released: 03/28/2010 Document Revised: 08/01/2015 Document Reviewed: 08/27/2014 Elsevier Interactive Patient Education  2018 [employer]  Cellulitis, Adult Cellulitis is a skin infection. The infected area is usually red and sore. This condition occurs most often in the arms and lower legs. It is very important to get treated for this condition. Follow these instructions at home:  Take over-the-counter and prescription medicines only as told by your doctor.  If you were prescribed an antibiotic medicine, take it as told by your doctor. Do not stop taking the antibiotic even if you start to feel better.  Drink enough fluid to keep your pee (urine) clear or pale yellow.  Do not touch or rub the infected area.  Raise (elevate) the infected area above the level of your heart while you are sitting or lying down.  Place warm or cold wet cloths (warm or cold compresses) on the infected area. Do this as told by your doctor.  Keep all follow-up visits as told by your doctor. This is important. These visits let your doctor make sure your infection is not getting worse. Contact a doctor if:  You have a fever.  Your symptoms do not get better after 1-2 days of treatment.  Your bone or joint under the infected area starts to hurt after the skin has healed.  Your infection comes back. This can happen in the same area or another area.  You have a swollen bump in the infected area.  You have new symptoms.  You feel ill and also have muscle aches and pains. Get help right away if:  Your symptoms get worse.  You feel very sleepy.  You throw up (vomit) or have watery poop (diarrhea) for a long time.  There are red streaks  coming from the infected area.  Your red area gets larger.  Your red area turns darker. This information is not intended to replace advice given to you by your health care provider. Make sure you discuss any questions you have with your health care provider. Document Released: 08/12/2007 Document Revised: 08/01/2015 Document Reviewed: 01/02/2015 Elsevier Interactive Patient Education  2018 [employer].

## 2017-04-01 NOTE — Progress Notes (Signed)
Chief Complaint  Patient presents with  . Follow-up  . Diabetes  . Fatigue  . Rash    under left breast --painful rash    Subjective:    Patient ID: Rachel Gonzalez, female    DOB: 02-02-1951, 67 y.o.   MRN: [id number]  HPI  [name], a 67 year old female with a history of hypertension and diabetes mellitus type 2 presents for a follow up of diabetes mellitus. Patient also complaining of rash to skin folds, primarily under breast tissue. She is also complaining to pain to left breast for 3 days.   Discomfort associated with rash: is painful and is pruritic. Denies: arthralgia, congestion, cough, crankiness, decrease in appetite, decrease in energy level, headache and irritability. Patient has not had previous evaluation of rash.  Patient has not had contacts with similar rash. Patient has not identified precipitant. Patient has not had new exposures (soaps, lotions, laundry detergents, foods, medications, plants, insects or animals.)   Most recent hemoglobin a1C was 12.2.  She is not exercising due to chronic right shoulder pain and is not adherent to low salt diet. She does not check blood pressure at home. She endorses lower extremity edema for greater than 1 week.    Patient denies chest pain, chest pressure/discomfort, fatigue, irregular heart beat, near-syncope, palpitations and tachypnea.  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  She states that she has been using Lantus 35  units at bedtime and Novolog 6 units for meal coverage. She did not bring glucometer on today  Patient denies foot ulcerations, increase appetite, nausea, paresthesia of the feet, polydipsia, polyuria, visual disturbances, vomitting and weight loss.    Past Medical History:  Diagnosis Date  . Anemia   . Diabetes mellitus   . Hypertension   . Renal disorder    Immunization History  Administered Date(s)  Administered  . Pneumococcal Polysaccharide-23 02/12/2015  . Td 05/07/2014  . Tdap 11/21/2007   Allergies  Allergen Reactions  . Tramadol Nausea And Vomiting   Social History   Socioeconomic History  . Marital status: Married    Spouse name: Not on file  . Number of children: Not on file  . Years of education: Not on file  . Highest education level: Not on file  Social Needs  . Financial resource strain: Not on file  . Food insecurity - worry: Not on file  . Food insecurity - inability: Not on file  . Transportation needs - medical: Not on file  . Transportation needs - non-medical: Not on file  Occupational History  . Not on file  Tobacco Use  . Smoking status: Never Smoker  . Smokeless tobacco: Never Used  Substance and Sexual Activity  . Alcohol use: No  . Drug use: No  . Sexual activity: Not on file  Other Topics Concern  . Not on file  Social History Narrative   From [place].   Married.   Three children, 9 grandchildren.   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   Travels to [place], [place]   Review of Systems  Constitutional: Negative for fever.  HENT: Negative.   Respiratory: Negative.   Cardiovascular: Negative.  Negative for palpitations and leg swelling.  Gastrointestinal: Negative.   Genitourinary: Negative.  Negative for hematuria.  Musculoskeletal: Positive for arthralgias (right shoulder pain). Myalgias: right foot pain/ ambulates with cane.  Skin: Positive for rash.       Pain to left breast  Neurological: Negative.  Negative for facial asymmetry, light-headedness and numbness.  Hematological: Negative.   Psychiatric/Behavioral: Negative.        Objective:   Physical Exam  Constitutional: She appears well-developed and well-nourished. She is active. She does not have a sickly appearance.  HENT:  Head: Normocephalic and atraumatic.  Nose: Mucosal edema present.  Eyes: Lids are normal.    Neck: Normal range of motion and full passive range of motion without pain. Neck supple.  Cardiovascular: Normal rate, regular rhythm, S1 normal, normal heart sounds and normal pulses.  Pulmonary/Chest: Effort normal and breath sounds normal.  Abdominal: Soft. Normal appearance and bowel sounds are normal. There is no tenderness.  Musculoskeletal:       Right shoulder: She exhibits decreased range of motion, tenderness, swelling and pain.  Right great toe amputation  Lymphadenopathy:       Head (right side): No submental and no submandibular adenopathy present.       Head (left side): No submental and no submandibular adenopathy present.  Neurological: She is alert.  Skin: Skin is warm, dry and intact. Rash noted.     Flat maculopapular rash underneath breast tissue, hyperpigmented with increased moisture.   Psychiatric: Her speech is normal and behavior is normal. Judgment and thought content normal. Cognition and memory are normal.      BP (!) 136/57 (BP Location: Left Arm, Patient Position: Sitting, Cuff Size: Large)   Pulse 78   Temp 98.4 F (36.9 C) (Oral)   Resp 16   Ht 5\' 4"  (1.626 m)   Wt 228 lb (103.4 kg)   SpO2 98%   BMI 39.14 kg/m  Assessment & Plan:   Type 2 diabetes mellitus with diabetic neuropathy, with long-term current use of insulin (HCC) Hemoglobin a1C has decreased 12.2 to 11.5 over the past several weeks. No medication changes warranted on today. Goal is < 7.  Recommend a lowfat, low carbohydrate diet divided over 5-6 small meals, increase water intake to 6-8 glasses, and 150 minutes per week of cardiovascular exercise.   - HgB A1c  Essential hypertension Blood pressure is at goal on current medication regimen.  We have discussed target BP range and blood pressure goal. I have advised patient to check BP regularly and to call us back or report to clinic if the numbers are consistently higher than 140/90. We discussed the importance of compliance with  medical therapy and DASH diet recommended, consequences of uncontrolled hypertension discussed.  - continue current BP medications  Intertrigo Will start a trial of Nystatin powder. Apply to skin folds 4 times per day as needed.  - nystatin (NYSTATIN) powder; Apply topically 4 (four) times daily.  Dispense: 15 g; Refill: 5  Cellulitis of left breast Keep area clean and dry.  Apply warm, moist compresses to area.  - sulfamethoxazole-trimethoprim (BACTRIM DS,SEPTRA DS) 800-160 MG tablet; Take 1 tablet by mouth 2 (two) times daily for 7 days.  Dispense: 14 tablet; Refill: 0   RTC: 3 months for chronic conditions  Nolon NationsLachina Moore Elleanor Guyett  MSN, FNP-C Patient Care [facility] [address]  [place], [place] [id number] [phone]

## 2017-04-03 LAB — URINE CULTURE

## 2017-04-07 ENCOUNTER — Ambulatory Visit: Payer: Medicare Other | Admitting: Family Medicine

## 2017-04-13 ENCOUNTER — Ambulatory Visit: Payer: Medicare Other | Attending: Physical Medicine and Rehabilitation | Admitting: Physical Therapy

## 2017-04-13 ENCOUNTER — Encounter: Payer: Self-pay | Admitting: Physical Therapy

## 2017-04-13 DIAGNOSIS — M6281 Muscle weakness (generalized): Secondary | ICD-10-CM | POA: Insufficient documentation

## 2017-04-13 DIAGNOSIS — R293 Abnormal posture: Secondary | ICD-10-CM | POA: Insufficient documentation

## 2017-04-13 DIAGNOSIS — M25611 Stiffness of right shoulder, not elsewhere classified: Secondary | ICD-10-CM

## 2017-04-13 DIAGNOSIS — G8929 Other chronic pain: Secondary | ICD-10-CM

## 2017-04-13 DIAGNOSIS — M25511 Pain in right shoulder: Secondary | ICD-10-CM | POA: Diagnosis not present

## 2017-04-13 NOTE — Therapy (Signed)
[facility] [address] [place], [place], [address] Phone: [phone]   Fax:  [phone]  Physical Therapy Treatment  Patient Details  Name: Rachel HalfCarol A Dalgleish MRN: [id number] Date of Birth: 05-01-1950 Referring Provider: Cammy CopaGregory Scott Dean MD   Encounter Date: 04/13/2017  PT End of Session - 04/13/17 1544    Visit Number  2    Number of Visits  13    Date for PT Re-Evaluation  05/10/17    Authorization Type  MCD (resubmit at 3rd or 4th visit)    PT Start Time  1502    PT Stop Time  1544    PT Time Calculation (min)  42 min    Activity Tolerance  Patient tolerated treatment well    Behavior During Therapy  [facility] for tasks assessed/performed       Past Medical History:  Diagnosis Date  . Anemia   . Diabetes mellitus   . Hypertension   . Renal disorder     Past Surgical History:  Procedure Laterality Date  . AMPUTATION Right 05/08/2014   Procedure: AMPUTATION RAY, right great toe;  Surgeon: Nadara MustardMarcus Duda V, MD;  Location: [facility] OR;  Service: Orthopedics;  Laterality: Right;  . arm surgery     . CESAREAN SECTION    . I&D EXTREMITY Right 05/08/2014   Procedure: IRRIGATION AND DEBRIDEMENT FOOT;  Surgeon: Nadara MustardMarcus Duda V, MD;  Location: [facility] OR;  Service: Orthopedics;  Laterality: Right;    There were no vitals filed for this visit.  Subjective Assessment - 04/13/17 1506    Subjective  "I feel like I am doing better today, I have been doing my exercises everyday"     Currently in Pain?  Yes    Pain Score  5     Pain Orientation  Right    Pain Descriptors / Indicators  Aching;Sore    Pain Type  Acute pain    Pain Frequency  Constant    Aggravating Factors   lifting over head. and reaching behind her back.                      [facility] Adult PT Treatment/Exercise - 04/13/17 1513      Neck Exercises: Seated   Other Seated Exercise  scap retract 2 x 10 holding 2 sec      Shoulder Exercises: Seated   Row   Strengthening;Both;10 reps;Theraband    Theraband Level (Shoulder Row)  Level 1 (Yellow)    Protraction  Strengthening;Right;12 reps using UE ranger    External Rotation  Strengthening;10 reps;Theraband    Theraband Level (Shoulder External Rotation)  Level 1 (Yellow)    Internal Rotation  AROM;Right;Strengthening;10 reps;Theraband    Theraband Level (Shoulder Internal Rotation)  Level 1 (Yellow)    Other Seated Exercises  lower trap stretch with elbows propped on bolster using yellow therabnd 2 x 10    Other Seated Exercises  scaption angle flexion to shoulder height 2 x 10  with controlled lowering      Manual Therapy   Manual Therapy  Scapular mobilization;Joint mobilization    Manual therapy comments  MTPR along R upper trap/ sub-clavius    Joint Mobilization  distal clavicle posterior / inferior grade 3    Scapular Mobilization  upward scapular assist with pt perfoming UE ranger protraction and abduction                PT Short Term Goals - 03/29/17 1739  PT SHORT TERM GOAL #1   Title  pt will be I with initial HEP    Baseline  no previous HEP    Time  3    Period  Weeks    Status  New    Target Date  04/19/17      PT SHORT TERM GOAL #2   Title  pt will be able to verbalize/ demo techniques to reduce pain and inflammation via RICE and HEP    Baseline  only used medication to calm pain    Time  3    Period  Weeks    Status  New    Target Date  04/19/17      PT SHORT TERM GOAL #3   Title  pt to increase R grip strength by >/= 8# to demo improvement of R shoulder function     Baseline  5.3#    Time  3    Period  Weeks    Status  New    Target Date  04/19/17      PT SHORT TERM GOAL #4   Title  increase PROM shoulder flexion/ abduction to >/= 70 degrees to promtoe shoulder mobility     Baseline  PROM flexion 45 degrees and 52 abduction with significant guarding    Time  3    Period  Weeks    Status  New    Target Date  04/19/17        PT Long Term  Goals - 03/29/17 1742      PT LONG TERM GOAL #1   Title  pt to increase R shoulder AROM flexion to >/= 100 degrees and IR/ER to >/= 60 degree with </= 4/10 pain  for functional mobility required for ADLs and personal hygiene    Baseline  70 degrees of flexion 58 degrees of abduction 48 degrees of internal rotation and 8 degrees of external rotaiton with pain rated at 7/10    Time  6    Period  Weeks    Status  New    Target Date  05/10/17      PT LONG TERM GOAL #2   Title  increase R shoulder strength to >/= 4-/5 to promote functional stability with lifting and carrying activities associated with ADLs    Baseline  flexion 3-/5, abduction 3-/5, IR 3-/5, ER 2+/5    Time  6    Period  Weeks    Status  New    Target Date  05/10/17      PT LONG TERM GOAL #3   Title  pt to be able to lift/ lower >/= 8# from an overhead shelf with </= 4/10 pain to promote functional liflting and carrying activities    Baseline  unable to reach and overhead shelf withRUE    Time  6    Period  Weeks    Status  New    Target Date  05/10/17      PT LONG TERM GOAL #4   Title  pt to be I with all HEP given as of [facility] to [facility] and progress current level of function    Baseline  no prevous HEP     Time  6    Period  Weeks    Status  New    Target Date  05/10/17            Plan - 04/13/17 1545    Clinical Impression Statement  pt reports she has  been consistent with her HEp and states she is feeling much better. reviewed previously provided HEP which she required minimal cues for proper form .continued with scaplar assist mobs and shoulder strengthening/ scap stability exercise which she reproted pain dropped to 3/10.     PT Next Visit Plan  review and update HEP, shoulder PROM/ AAROM, scapular stability, gripping exercise, update HEP for shoulder IR/ER with yellow band.     PT Home Exercise Plan  shoulder table slides flexion/ abduction, scapular setting, upper trap stretching, wand ER     Consulted and Agree with Plan of Care  Patient       Patient will benefit from skilled therapeutic intervention in order to improve the following deficits and impairments:  Pain, Impaired UE functional use, Increased fascial restricitons, Increased muscle spasms, Postural dysfunction, Improper body mechanics, Decreased range of motion, Decreased activity tolerance, Decreased knowledge of precautions, Decreased endurance, Decreased strength  Visit Diagnosis: Chronic right shoulder pain  Muscle weakness (generalized)  Abnormal posture  Stiffness of right shoulder, not elsewhere classified     Problem List Patient Active Problem List   Diagnosis Date Noted  . Stage 3 chronic kidney disease (HCC) 12/01/2016  . Frequent falls 04/28/2016  . Right arm weakness 04/28/2016  . Bilateral lower extremity edema 04/28/2016  . Screening for colon cancer 04/28/2016  . Abnormal urinalysis 09/13/2014  . Chest pain 09/13/2014  . Abdominal pain, recurrent 09/13/2014  . Dehydration with hyponatremia 09/13/2014  . Acute hyperkalemia 09/13/2014  . Acute renal failure superimposed on stage 3 chronic kidney disease (HCC) 09/13/2014  . Anemia 05/06/2014  . Diabetic neuropathy, type II diabetes mellitus (HCC) 05/06/2014  . Obesity (BMI 30-39.9) 05/06/2014  . Cellulitis of right foot 05/06/2014  . Leg swelling 03/31/2011  . Essential hypertension 03/07/2011  . 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 Right arm pain 08/04/2006  . Osteoarthritis 02/14/2006  . Diabetes mellitus type 2, uncontrolled (HCC) 01/11/2006   Lulu Riding PT, DPT, LAT, ATC  04/13/17  3:47 PM      [facility] [address] [place], [name], [address] Phone: [phone]   Fax:  [phone]  Name: BROOKE STEINHILBER MRN: [id number] Date of Birth: 11/05/1950

## 2017-04-20 ENCOUNTER — Ambulatory Visit: Payer: Medicare Other | Admitting: Physical Therapy

## 2017-04-20 ENCOUNTER — Encounter: Payer: Self-pay | Admitting: Physical Therapy

## 2017-04-20 DIAGNOSIS — R293 Abnormal posture: Secondary | ICD-10-CM

## 2017-04-20 DIAGNOSIS — G8929 Other chronic pain: Secondary | ICD-10-CM | POA: Diagnosis not present

## 2017-04-20 DIAGNOSIS — M25511 Pain in right shoulder: Principal | ICD-10-CM

## 2017-04-20 DIAGNOSIS — M25611 Stiffness of right shoulder, not elsewhere classified: Secondary | ICD-10-CM | POA: Diagnosis not present

## 2017-04-20 DIAGNOSIS — M6281 Muscle weakness (generalized): Secondary | ICD-10-CM

## 2017-04-20 NOTE — Therapy (Signed)
[facility] [address] [place], [name], [address] Phone: [phone]   Fax:  [phone]  Physical Therapy Treatment / Medicaid Re-submission  Patient Details  Name: Rachel Gonzalez MRN: [id number] Date of Birth: Jan 19, 1951 Referring Provider: Cammy Copa MD   Encounter Date: 04/20/2017  PT End of Session - 04/20/17 1535    Visit Number  3    Number of Visits  13    Date for PT Re-Evaluation  05/10/17    Authorization Type  MCD (resubmit at 3rd or 4th visit)    PT Start Time  1501    PT Stop Time  1544    PT Time Calculation (min)  43 min    Activity Tolerance  Patient tolerated treatment well    Behavior During Therapy  [facility] for tasks assessed/performed       Past Medical History:  Diagnosis Date  . Anemia   . Diabetes mellitus   . Hypertension   . Renal disorder     Past Surgical History:  Procedure Laterality Date  . AMPUTATION Right 05/08/2014   Procedure: AMPUTATION RAY, right great toe;  Surgeon: Nadara Mustard, MD;  Location: [facility] OR;  Service: Orthopedics;  Laterality: Right;  . arm surgery     . CESAREAN SECTION    . I&D EXTREMITY Right 05/08/2014   Procedure: IRRIGATION AND DEBRIDEMENT FOOT;  Surgeon: Nadara Mustard, MD;  Location: [facility] OR;  Service: Orthopedics;  Laterality: Right;    There were no vitals filed for this visit.  Subjective Assessment - 04/20/17 1502    Subjective  "I have been doing pretty good, not as bad since I started PT."    Currently in Pain?  Yes    Pain Score  5     Pain Location  Shoulder    Pain Orientation  Right    Pain Descriptors / Indicators  Sore    Pain Type  Acute pain    Pain Onset  More than a month ago    Pain Frequency  Constant    Aggravating Factors   unsure    Pain Relieving Factors  medication, exercise,          [facility] PT Assessment - 04/20/17 1505      AROM   Right Shoulder Extension  45 Degrees    Right Shoulder Flexion  122 Degrees    Right  Shoulder ABduction  98 Degrees    Right Shoulder Internal Rotation  4 Degrees assessed at 90/90    Right Shoulder External Rotation  52 Degrees assessed at 90/90      Strength   Right Shoulder Flexion  3/5    Right Shoulder Extension  3+/5    Right Shoulder ABduction  3/5    Right Shoulder Internal Rotation  3/5    Right Shoulder External Rotation  3/5    Right Hand Grip (lbs)  13 16,13,10                  [facility] Adult PT Treatment/Exercise - 04/20/17 1529      Shoulder Exercises: Seated   External Rotation  Strengthening;10 reps;Theraband    Theraband Level (Shoulder External Rotation)  Level 1 (Yellow)    Internal Rotation  AROM;Right;Strengthening;10 reps;Theraband    Theraband Level (Shoulder Internal Rotation)  Level 1 (Yellow)    Other Seated Exercises  lower trap stretch with elbows propped on bolster using yellow therabnd 2 x 10,  Other Seated Exercises  scapular retraction 2 x 10 with yellow theraband      Shoulder Exercises: Standing   Other Standing Exercises  wall push up 2 x 10  tactile cues for appropriate form      Manual Therapy   Manual therapy comments  MTPR along R upper trap/ sub-clavius    Joint Mobilization  R first rib mobs grade 3  how to perform with towel    Scapular Mobilization  upward scapular assist with pt perfoming UE ranger protraction and abduction                PT Short Term Goals - 04/20/17 1511      PT SHORT TERM GOAL #1   Title  pt will be I with initial HEP    Time  3    Period  Weeks    Status  Achieved      PT SHORT TERM GOAL #2   Title  pt will be able to verbalize/ demo techniques to reduce pain and inflammation via RICE and HEP    Time  3    Period  Weeks    Status  Achieved      PT SHORT TERM GOAL #3   Title  pt to increase R grip strength by >/= 8# to demo improvement of R shoulder function     Time  3    Period  Weeks    Status  Achieved      PT SHORT TERM GOAL #4   Title  increase PROM  shoulder flexion/ abduction to >/= 70 degrees to promtoe shoulder mobility     Time  3    Period  Weeks    Status  Achieved        PT Long Term Goals - 04/20/17 1512      PT LONG TERM GOAL #1   Title  pt to increase R shoulder AROM flexion to >/= 100 degrees and IR/ER to >/= 60 degree with </= 4/10 pain  for functional mobility required for ADLs and personal hygiene    Baseline  UPDATED: 122 degrees of flexion 98 degrees of abduction 4 degrees of internal rotation and 8 degrees of 52 external rotaiton with pain rated at 7/10    Time  6    Period  Weeks    Status  On-going    Target Date  06/01/17      PT LONG TERM GOAL #2   Title  increase R shoulder strength to >/= 4-/5 to promote functional stability with lifting and carrying activities associated with ADLs    Baseline  UPDATED: flexion 3/5, abduction 3/5, IR 3/5, ER 3/5    Time  6    Period  Weeks    Status  On-going    Target Date  06/01/17      PT LONG TERM GOAL #3   Title  pt to be able to lift/ lower >/= 8# from an overhead shelf with </= 4/10 pain to promote functional liflting and carrying activities    Baseline  UPDATED: unable to lift 2# overhead with pain at 7/10    Time  6    Period  Weeks    Status  On-going      PT LONG TERM GOAL #4   Title  pt to be I with all HEP [facility] current level of function    Baseline  UPDATED: independent with current exercise  given but continue progress as tolerated    Time  6    Period  Weeks    Status  On-going    Target Date  06/01/17            Plan - 04/20/17 1735    Clinical Impression Statement  Mrs. Hesch continues to make progress with physical therapy improving shoulder AROM and strength compared to initial evaluation. She still exhibits limited R shoulder mobility compared bil and reports pain fluctuates between 4-6/10 pain. She is progressing well with goals meeting all STG's this visit and is progressing appropriately with  long term goals. She would benefit from continued physical therapy to reduce pain, promote appropriate scapulohumeral rhythm to reduce impingement, increase strength and promote lifting/ carrying above and below shoulder height to maximze function 2 x a week for 4 weeks by addressing the deficits listed below.     PT Frequency  2x / week    PT Duration  4 weeks    PT Treatment/Interventions  ADLs/Self Care Home Management;Neuromuscular re-education;Passive range of motion;Manual techniques;Dry needling;Patient/family education;Traction;Ultrasound;Cryotherapy;Electrical Stimulation;Therapeutic exercise;Iontophoresis 4mg /ml Dexamethasone;Moist Heat;Therapeutic activities;Functional mobility training;Taping    PT Next Visit Plan  pdate HEP, shoulder PROM/ AAROM, scapular stability, gripping exercise, update HEP for shoulder IR/ER with yellow band.     PT Home Exercise Plan  shoulder table slides flexion/ abduction, scapular setting, upper trap stretching, wand ER, 1st rib mobs    Consulted and Agree with Plan of Care  Patient       Patient will benefit from skilled therapeutic intervention in order to improve the following deficits and impairments:  Pain, Impaired UE functional use, Increased fascial restricitons, Increased muscle spasms, Postural dysfunction, Improper body mechanics, Decreased range of motion, Decreased activity tolerance, Decreased knowledge of precautions, Decreased endurance, Decreased strength  Visit Diagnosis: Chronic right shoulder pain  Muscle weakness (generalized)  Abnormal posture  Stiffness of right shoulder, not elsewhere classified     Problem List Patient Active Problem List   Diagnosis Date Noted  . Stage 3 chronic kidney disease (HCC) 12/01/2016  . Frequent falls 04/28/2016  . Right arm weakness 04/28/2016  . Bilateral lower extremity edema 04/28/2016  . Screening for colon cancer 04/28/2016  . Abnormal urinalysis 09/13/2014  . Chest pain 09/13/2014  .  Abdominal pain, recurrent 09/13/2014  . Dehydration with hyponatremia 09/13/2014  . Acute hyperkalemia 09/13/2014  . Acute renal failure superimposed on stage 3 chronic kidney disease (HCC) 09/13/2014  . Anemia 05/06/2014  . Diabetic neuropathy, type II diabetes mellitus (HCC) 05/06/2014  . Obesity (BMI 30-39.9) 05/06/2014  . Cellulitis of right foot 05/06/2014  . Leg swelling 03/31/2011  . Essential hypertension 03/07/2011  . 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 Right arm pain 08/04/2006  . Osteoarthritis 02/14/2006  . Diabetes mellitus type 2, uncontrolled (HCC) 01/11/2006   Lulu Riding PT, DPT, LAT, ATC  04/20/17  5:49 PM      [facility] [address] [place], [name], [address] Phone: [phone]   Fax:  [phone]  Name: SOMALIA SEGLER MRN: [id number] Date of Birth: 1951/02/22

## 2017-04-23 ENCOUNTER — Ambulatory Visit (HOSPITAL_COMMUNITY)
Admission: EM | Admit: 2017-04-23 | Discharge: 2017-04-23 | Disposition: A | Discharge status: Home / Self Care | Payer: Medicaid Other | Attending: Family Medicine | Admitting: Family Medicine

## 2017-04-23 ENCOUNTER — Telehealth (HOSPITAL_COMMUNITY): Payer: Self-pay | Admitting: Emergency Medicine

## 2017-04-23 ENCOUNTER — Ambulatory Visit (INDEPENDENT_AMBULATORY_CARE_PROVIDER_SITE_OTHER): Payer: Medicaid Other

## 2017-04-23 ENCOUNTER — Encounter (HOSPITAL_COMMUNITY): Payer: Self-pay | Admitting: Family Medicine

## 2017-04-23 DIAGNOSIS — S90851A Superficial foreign body, right foot, initial encounter: Secondary | ICD-10-CM | POA: Diagnosis not present

## 2017-04-23 DIAGNOSIS — S99921A Unspecified injury of right foot, initial encounter: Secondary | ICD-10-CM | POA: Diagnosis not present

## 2017-04-23 MED ORDER — MUPIROCIN 2 % EX OINT: 1.0000 "application " | TOPICAL_OINTMENT | Freq: Three times a day (TID) | CUTANEOUS | 1 refills | Status: DC

## 2017-04-23 NOTE — ED Triage Notes (Addendum)
Pt reports that she stepped on a piece of glass earlier today and worried there is a piece in her right  foot. Pt has hx of diabetes.

## 2017-04-23 NOTE — ED Provider Notes (Signed)
[facility]   [id number] 04/23/17 Arrival Time: 1506   SUBJECTIVE:  Rachel Gonzalez is a 67 y.o. female who presents to the urgent care with complaint of pain right foot after stepping on glass.  Last Td 2016  Past Medical History:  Diagnosis Date  . Anemia   . Diabetes mellitus   . Hypertension   . Renal disorder    Family History  Problem Relation Age of Onset  . Diabetes Mother    Social History   Socioeconomic History  . Marital status: Married    Spouse name: Not on file  . Number of children: Not on file  . Years of education: Not on file  . Highest education level: Not on file  Social Needs  . Financial resource strain: Not on file  . Food insecurity - worry: Not on file  . Food insecurity - inability: Not on file  . Transportation needs - medical: Not on file  . Transportation needs - non-medical: Not on file  Occupational History  . Not on file  Tobacco Use  . Smoking status: Never Smoker  . Smokeless tobacco: Never Used  Substance and Sexual Activity  . Alcohol use: No  . Drug use: No  . Sexual activity: Not on file  Other Topics Concern  . Not on file  Social History Narrative   From [place].   Married.   Three children, 9 grandchildren.   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   Travels to [place], [place]   No outpatient medications have been marked as taking for the 04/23/17 encounter [facility] Encounter).   Allergies  Allergen Reactions  . Tramadol Nausea And Vomiting  . Adhesive [Tape]       ROS: As per HPI, remainder of ROS negative.   OBJECTIVE:   Vitals:   04/23/17 1618  BP: (!) 144/65  Pulse: 68  Resp: 16  Temp: 98 F (36.7 C)  TempSrc: Oral  SpO2: 98%     General appearance: alert; no distress Eyes: PERRL; EOMI; conjunctiva normal HENT: normocephalic; atraumatic;oral mucosa normal Neck: supple Back: no CVA tenderness Extremities: no cyanosis or edema;  symmetrical with no gross deformities Skin: warm and dry, foreign body identified and removed with No. 15 BP blade by flicking the edge of it Neurologic: normal gait; grossly normal Psychological: alert and cooperative; normal mood and affect      Labs:  Results for orders placed or performed in visit on 04/01/17  Urine Culture  Result Value Ref Range   Urine Culture, Routine Final report    Organism ID, Bacteria Comment   Glucose, capillary  Result Value Ref Range   Glucose-Capillary 91 65 - 99 mg/dL  HgB N8G  Result Value Ref Range   Hemoglobin A1C 11.5   POCT urinalysis dip (device)  Result Value Ref Range   Glucose, UA NEGATIVE NEGATIVE mg/dL   Bilirubin Urine NEGATIVE NEGATIVE   Ketones, ur NEGATIVE NEGATIVE mg/dL   Specific Gravity, Urine 1.015 1.005 - 1.030   Hgb urine dipstick SMALL (A) NEGATIVE   pH 5.5 5.0 - 8.0   Protein, ur 30 (A) NEGATIVE mg/dL   Urobilinogen, UA 0.2 0.0 - 1.0 mg/dL   Nitrite NEGATIVE NEGATIVE   Leukocytes, UA MODERATE (A) NEGATIVE    Labs Reviewed - No data to display  Dg Foot Complete Right  Result Date: 04/23/2017 CLINICAL DATA:  Stepped on glass. The question foreign body. Personal history of  amputation. EXAM: RIGHT FOOT COMPLETE - 3+ VIEW COMPARISON:  Right foot radiographs 05/06/2014 FINDINGS: A linear density projects along the plantar surface of the foot at the level of the proximal phalanges. This is not clearly seen on the AP or oblique images. Amputation of the great toe and first metatarsal is noted. Remaining osseous structures are unremarkable. IMPRESSION: 1. 4 mm linear density along the plantar surface of the foot projecting below the proximal phalanges is compatible with the radiopaque foreign body/glass. 2. The density is not localized on the AP or oblique views. 3. Amputation. Electronically Signed   By: Marin Robertshristopher  Mattern M.D.   On: 04/23/2017 16:43       ASSESSMENT & PLAN:  1. Foreign body in right foot, initial  encounter     Meds ordered this encounter  Medications  . mupirocin ointment (BACTROBAN) 2 %    Sig: Apply 1 application topically 3 (three) times daily.    Dispense:  22 g    Refill:  1    Reviewed expectations re: course of current medical issues. Questions answered. Outlined signs and symptoms indicating need for more acute intervention. Patient verbalized understanding. After Visit Summary given.    Procedures:      Elvina SidleLauenstein, Quill Grinder, MD 04/23/17 [phone]

## 2017-04-27 ENCOUNTER — Encounter: Payer: Self-pay | Admitting: Physical Therapy

## 2017-04-27 ENCOUNTER — Ambulatory Visit: Payer: Medicare Other | Admitting: Physical Therapy

## 2017-04-27 DIAGNOSIS — M25611 Stiffness of right shoulder, not elsewhere classified: Secondary | ICD-10-CM | POA: Diagnosis not present

## 2017-04-27 DIAGNOSIS — M25511 Pain in right shoulder: Secondary | ICD-10-CM | POA: Diagnosis not present

## 2017-04-27 DIAGNOSIS — M6281 Muscle weakness (generalized): Secondary | ICD-10-CM

## 2017-04-27 DIAGNOSIS — R293 Abnormal posture: Secondary | ICD-10-CM

## 2017-04-27 DIAGNOSIS — G8929 Other chronic pain: Secondary | ICD-10-CM | POA: Diagnosis not present

## 2017-04-27 NOTE — Therapy (Addendum)
[place], [place], [address] Phone: [phone]   Fax:  [phone]  Physical Therapy Treatment / Discharge Summary  Patient Details  Name: Rachel Gonzalez MRN: [id number] Date of Birth: 1950-04-01 Referring Provider: Meredith Pel MD   Encounter Date: 04/27/2017  PT End of Session - 04/27/17 1449    Visit Number  4    Number of Visits  13    Date for PT Re-Evaluation  05/10/17    PT Start Time  1448    PT Stop Time  1536    PT Time Calculation (min)  48 min    Activity Tolerance  Patient tolerated treatment well    Behavior During Therapy  [facility] for tasks assessed/performed       Past Medical History:  Diagnosis Date  . Anemia   . Diabetes mellitus   . Hypertension   . Renal disorder     Past Surgical History:  Procedure Laterality Date  . AMPUTATION Right 05/08/2014   Procedure: AMPUTATION RAY, right great toe;  Surgeon: Newt Minion, MD;  Location: [place];  Service: Orthopedics;  Laterality: Right;  . arm surgery     . CESAREAN SECTION    . I&D EXTREMITY Right 05/08/2014   Procedure: IRRIGATION AND DEBRIDEMENT FOOT;  Surgeon: Newt Minion, MD;  Location: [place];  Service: Orthopedics;  Laterality: Right;    There were no vitals filed for this visit.  Subjective Assessment - 04/27/17 1446    Subjective  "I am feeling like pain is only 4-5/10, I am unsure what sets it off"    Currently in Pain?  Yes    Pain Score  4     Pain Location  Shoulder    Pain Orientation  Right    Pain Descriptors / Indicators  Sore    Pain Type  Chronic pain    Pain Frequency  Intermittent    Aggravating Factors   unsure    Pain Relieving Factors  medication, exercise                       [facility] Adult PT Treatment/Exercise - 04/27/17 0001      Self-Care   Self-Care  Other Self-Care Comments    Other Self-Care Comments   teaching significant other on how to perform MTPR along the R upper  trap      Shoulder Exercises: Seated   Other Seated Exercises  lower trap strengthening  with elbows propped on bolster using yellow therabnd 2 x 10,      Other Seated Exercises  scapular retraction 2 x 10 with yellow theraband      Shoulder Exercises: Stretch   Other Shoulder Stretches  rhomboid stretch 2 x 30    Other Shoulder Stretches  upper trap stretch 2 x30 sec hold      Manual Therapy   Manual therapy comments  MTPR along R upper trap/ sub-clavius    Scapular Mobilization  upward scapular assist with pt perfoming UE ranger protraction and horizontal abduction/ adduction             PT Education - 04/27/17 1537    Education provided  Yes    Education Details  how to perofrm MTPR along the R upper trap and appropriate pressure    Person(s) Educated  Patient;Spouse    Methods  Explanation;Verbal cues    Comprehension  Verbalized understanding;Verbal cues required  PT Short Term Goals - 04/20/17 1511      PT SHORT TERM GOAL #1   Title  pt will be I with initial HEP    Time  3    Period  Weeks    Status  Achieved      PT SHORT TERM GOAL #2   Title  pt will be able to verbalize/ demo techniques to reduce pain and inflammation via RICE and HEP    Time  3    Period  Weeks    Status  Achieved      PT SHORT TERM GOAL #3   Title  pt to increase R grip strength by >/= 8# to demo improvement of R shoulder function     Time  3    Period  Weeks    Status  Achieved      PT SHORT TERM GOAL #4   Title  increase PROM shoulder flexion/ abduction to >/= 70 degrees to promtoe shoulder mobility     Time  3    Period  Weeks    Status  Achieved        PT Long Term Goals - 04/20/17 1512      PT LONG TERM GOAL #1   Title  pt to increase R shoulder AROM flexion to >/= 100 degrees and IR/ER to >/= 60 degree with </= 4/10 pain  for functional mobility required for ADLs and personal hygiene    Baseline  UPDATED: 122 degrees of flexion 98 degrees of abduction 4 degrees of  internal rotation and 8 degrees of 52 external rotaiton with pain rated at 7/10    Time  6    Period  Weeks    Status  On-going    Target Date  06/01/17      PT LONG TERM GOAL #2   Title  increase R shoulder strength to >/= 4-/5 to promote functional stability with lifting and carrying activities associated with ADLs    Baseline  UPDATED: flexion 3/5, abduction 3/5, IR 3/5, ER 3/5    Time  6    Period  Weeks    Status  On-going    Target Date  06/01/17      PT LONG TERM GOAL #3   Title  pt to be able to lift/ lower >/= 8# from an overhead shelf with </= 4/10 pain to promote functional liflting and carrying activities    Baseline  UPDATED: unable to lift 2# overhead with pain at 7/10    Time  6    Period  Weeks    Status  On-going      PT LONG TERM GOAL #4   Title  pt to be I with all HEP given as of last visit to [facility] and progress current level of function    Baseline  UPDATED: independent with current exercise given but continue progress as tolerated    Time  6    Period  Weeks    Status  On-going    Target Date  06/01/17            Plan - 04/27/17 1538    Clinical Impression Statement  pt continues to make progress with physical therpay reporting pain at 4-5/10 today. continued soft tissue techniques and taught pt's husband how to perform them at home. continued working scapular stabilizers and lower trap to promote scapular upward rotation which she performed well. post session she reported pain dropped to 0/10.  PT Next Visit Plan  pdate HEP, shoulder PROM/ AAROM, scapular stability, gripping exercise, update HEP for shoulder IR/ER with yellow band.     PT Home Exercise Plan  shoulder table slides flexion/ abduction, scapular setting, upper trap stretching, wand ER, 1st rib mobs    Consulted and Agree with Plan of Care  Patient       Patient will benefit from skilled therapeutic intervention in order to improve the following deficits and impairments:  Pain,  Impaired UE functional use, Increased fascial restricitons, Increased muscle spasms, Postural dysfunction, Improper body mechanics, Decreased range of motion, Decreased activity tolerance, Decreased knowledge of precautions, Decreased endurance, Decreased strength  Visit Diagnosis: Chronic right shoulder pain  Muscle weakness (generalized)  Abnormal posture  Stiffness of right shoulder, not elsewhere classified     Problem List Patient Active Problem List   Diagnosis Date Noted  . Stage 3 chronic kidney disease ([place]) 12/01/2016  . Frequent falls 04/28/2016  . Right arm weakness 04/28/2016  . Bilateral lower extremity edema 04/28/2016  . Screening for colon cancer 04/28/2016  . Abnormal urinalysis 09/13/2014  . Chest pain 09/13/2014  . Abdominal pain, recurrent 09/13/2014  . Dehydration with hyponatremia 09/13/2014  . Acute hyperkalemia 09/13/2014  . Acute renal failure superimposed on stage 3 chronic kidney disease ([place]) 09/13/2014  . Anemia 05/06/2014  . Diabetic neuropathy, type II diabetes mellitus ([place]) 05/06/2014  . Obesity (BMI 30-39.9) 05/06/2014  . Cellulitis of right foot 05/06/2014  . Leg swelling 03/31/2011  . Essential hypertension 03/07/2011  . 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 Right arm pain 08/04/2006  . Osteoarthritis 02/14/2006  . Diabetes mellitus type 2, uncontrolled ([place]) 01/11/2006   Starr Lake PT, DPT, LAT, ATC  04/27/17  3:40 PM      [facility] [address] [place], [place], [address] Phone: [phone]   Fax:  [phone]  Name: SANAIYA WELLIVER MRN: [id number] Date of Birth: [birth year]        PHYSICAL THERAPY DISCHARGE SUMMARY  Visits from Start of Care: 4  Current functional level related to goals / functional outcomes: See goals   Remaining deficits: unknown   Education / Equipment: HEP  Plan: Patient agrees to discharge.  Patient  goals were not met. Patient is being discharged due to not returning since the last visit.  ?????

## 2017-05-07 DIAGNOSIS — E1122 Type 2 diabetes mellitus with diabetic chronic kidney disease: Secondary | ICD-10-CM | POA: Diagnosis not present

## 2017-05-07 DIAGNOSIS — D631 Anemia in chronic kidney disease: Secondary | ICD-10-CM | POA: Diagnosis not present

## 2017-05-07 DIAGNOSIS — N183 Chronic kidney disease, stage 3 (moderate): Secondary | ICD-10-CM | POA: Diagnosis not present

## 2017-05-07 DIAGNOSIS — R809 Proteinuria, unspecified: Secondary | ICD-10-CM | POA: Diagnosis not present

## 2017-05-07 DIAGNOSIS — I129 Hypertensive chronic kidney disease with stage 1 through stage 4 chronic kidney disease, or unspecified chronic kidney disease: Secondary | ICD-10-CM | POA: Diagnosis not present

## 2017-05-07 DIAGNOSIS — Z6841 Body Mass Index (BMI) 40.0 and over, adult: Secondary | ICD-10-CM | POA: Diagnosis not present

## 2017-05-11 ENCOUNTER — Ambulatory Visit: Payer: Medicare Other | Admitting: Physical Therapy

## 2017-05-20 ENCOUNTER — Other Ambulatory Visit: Payer: Self-pay | Admitting: Family Medicine

## 2017-05-20 NOTE — Telephone Encounter (Signed)
[place], Please advise.  

## 2017-05-26 ENCOUNTER — Telehealth: Payer: Self-pay

## 2017-05-26 NOTE — Telephone Encounter (Signed)
Patient called and is asking if she can have a refill on Tylenol #3 and she needs a new handicap placard filled out if possible. Please advise. Thanks!

## 2017-05-28 ENCOUNTER — Other Ambulatory Visit: Payer: Self-pay | Admitting: Family Medicine

## 2017-05-28 DIAGNOSIS — G8929 Other chronic pain: Secondary | ICD-10-CM

## 2017-05-28 DIAGNOSIS — M25511 Pain in right shoulder: Principal | ICD-10-CM

## 2017-05-28 MED ORDER — ACETAMINOPHEN-CODEINE #3 300-30 MG PO TABS: 1.0000 | ORAL_TABLET | Freq: Four times a day (QID) | ORAL | 0 refills | Status: DC | PRN

## 2017-05-28 NOTE — Progress Notes (Signed)
Reviewed [place] Substance Reporting system prior to prescribing opiate medications. No inconsistencies noted.  Meds ordered this encounter  Medications  . acetaminophen-codeine (TYLENOL #3) 300-30 MG tablet    Sig: Take 1 tablet by mouth every 6 (six) hours as needed for moderate pain.    Dispense:  15 tablet    Refill:  0    Order Specific Question:   Supervising Provider    Answer:   JEGEDE, OLUGBEMIGA E [[id number]]  Rachel Gonzalez Yonah Tangeman  MSN, FNP-C [facility] [address]  [place], [place] [address] [phone]  

## 2017-06-01 NOTE — Telephone Encounter (Signed)
Called and left message advising this was ready

## 2017-06-03 ENCOUNTER — Other Ambulatory Visit: Payer: Self-pay

## 2017-06-03 DIAGNOSIS — J309 Allergic rhinitis, unspecified: Secondary | ICD-10-CM

## 2017-06-03 MED ORDER — LEVOCETIRIZINE DIHYDROCHLORIDE 5 MG PO TABS: ORAL_TABLET | ORAL | 11 refills | Status: DC

## 2017-06-28 ENCOUNTER — Other Ambulatory Visit: Payer: Self-pay | Admitting: Family Medicine

## 2017-06-28 DIAGNOSIS — G8929 Other chronic pain: Secondary | ICD-10-CM

## 2017-06-28 DIAGNOSIS — M25511 Pain in right shoulder: Principal | ICD-10-CM

## 2017-06-30 ENCOUNTER — Ambulatory Visit (INDEPENDENT_AMBULATORY_CARE_PROVIDER_SITE_OTHER): Payer: Medicare Other | Admitting: Family Medicine

## 2017-06-30 ENCOUNTER — Encounter: Payer: Self-pay | Admitting: Family Medicine

## 2017-06-30 VITALS — BP 156/70 | HR 84 | Temp 98.6°F | Resp 14 | Ht 64.0 in | Wt 228.0 lb

## 2017-06-30 DIAGNOSIS — Z794 Long term (current) use of insulin: Secondary | ICD-10-CM | POA: Diagnosis not present

## 2017-06-30 DIAGNOSIS — I10 Essential (primary) hypertension: Secondary | ICD-10-CM

## 2017-06-30 DIAGNOSIS — E118 Type 2 diabetes mellitus with unspecified complications: Secondary | ICD-10-CM

## 2017-06-30 DIAGNOSIS — G8929 Other chronic pain: Secondary | ICD-10-CM

## 2017-06-30 DIAGNOSIS — M25511 Pain in right shoulder: Secondary | ICD-10-CM | POA: Diagnosis not present

## 2017-06-30 DIAGNOSIS — E1165 Type 2 diabetes mellitus with hyperglycemia: Secondary | ICD-10-CM

## 2017-06-30 DIAGNOSIS — E119 Type 2 diabetes mellitus without complications: Secondary | ICD-10-CM | POA: Diagnosis not present

## 2017-06-30 DIAGNOSIS — IMO0001 Reserved for inherently not codable concepts without codable children: Secondary | ICD-10-CM

## 2017-06-30 LAB — POCT URINALYSIS DIP (MANUAL ENTRY)
BILIRUBIN UA: NEGATIVE mg/dL
Bilirubin, UA: NEGATIVE
Glucose, UA: NEGATIVE mg/dL
Nitrite, UA: NEGATIVE
PH UA: 5.5 (ref 5.0–8.0)
Spec Grav, UA: 1.015 (ref 1.010–1.025)
Urobilinogen, UA: 0.2 E.U./dL

## 2017-06-30 LAB — POCT GLYCOSYLATED HEMOGLOBIN (HGB A1C): HEMOGLOBIN A1C: 11.3

## 2017-06-30 MED ORDER — FREESTYLE LIBRE 14 DAY READER DEVI: 1.0000 | Freq: Three times a day (TID) | 0 refills | Status: DC

## 2017-06-30 MED ORDER — LISINOPRIL 20 MG PO TABS: 20.0000 mg | ORAL_TABLET | Freq: Every day | ORAL | 1 refills | Status: DC

## 2017-06-30 MED ORDER — ACETAMINOPHEN-CODEINE #3 300-30 MG PO TABS
1.0000 | ORAL_TABLET | Freq: Four times a day (QID) | ORAL | 0 refills | Status: DC | PRN
Start: 2017-06-30 — End: 2017-07-30

## 2017-06-30 NOTE — Progress Notes (Signed)
poct

## 2017-06-30 NOTE — Patient Instructions (Signed)
Your hemoglobin a1c is 11.3. It is important that you take all antidiabetic medications consistnently to achieve positive outcomes.   Your blood pressure is above goal. Will increase Lisinopril to 20 mg daily.   Continue medication, monitor blood pressure at home. Continue DASH diet. Reminder to go to the ER if any CP, SOB, nausea, dizziness, severe HA, changes vision/speech, left arm numbness and tingling and jaw pain.

## 2017-07-01 LAB — COMPREHENSIVE METABOLIC PANEL
ALBUMIN: 3.9 g/dL (ref 3.6–4.8)
ALK PHOS: 196 IU/L — AB (ref 39–117)
ALT: 9 IU/L (ref 0–32)
AST: 10 IU/L (ref 0–40)
Albumin/Globulin Ratio: 1.4 (ref 1.2–2.2)
BILIRUBIN TOTAL: 0.2 mg/dL (ref 0.0–1.2)
BUN/Creatinine Ratio: 16 (ref 12–28)
BUN: 20 mg/dL (ref 8–27)
CHLORIDE: 107 mmol/L — AB (ref 96–106)
CO2: 26 mmol/L (ref 20–29)
Calcium: 9.2 mg/dL (ref 8.7–10.3)
Creatinine, Ser: 1.29 mg/dL — ABNORMAL HIGH (ref 0.57–1.00)
GFR calc Af Amer: 50 mL/min/{1.73_m2} — ABNORMAL LOW (ref >59)
GFR calc non Af Amer: 44 mL/min/{1.73_m2} — ABNORMAL LOW (ref >59)
GLUCOSE: 56 mg/dL — AB (ref 65–99)
Globulin, Total: 2.8 g/dL (ref 1.5–4.5)
Potassium: 4.2 mmol/L (ref 3.5–5.2)
Sodium: 145 mmol/L — ABNORMAL HIGH (ref 134–144)
TOTAL PROTEIN: 6.7 g/dL (ref 6.0–8.5)

## 2017-07-04 NOTE — Progress Notes (Signed)
Rachel Gonzalez, a 67 year old female with a history of uncontrolled hypertension and type 2 diabetes mellitus. Patient says that she has not been taking medications consistently.   Diabetes  Her disease course has been stable. Associated symptoms include fatigue. Pertinent negatives for diabetes include no blurred vision, no chest pain, no foot ulcerations, no polydipsia, no polyphagia, no polyuria, no weakness and no weight loss. Pertinent negatives for diabetic complications include no CVA or heart disease. Risk factors for coronary artery disease include hypertension and dyslipidemia. Current diabetic treatment includes insulin injections and oral agent (monotherapy). She is compliant with treatment some of the time. She is following a high fat/cholesterol diet. When asked about meal planning, she reported none. She has not had a previous visit with a dietitian. She rarely participates in exercise. (Ms. Hascall does not monitor blood sugars frequently. ) An ACE inhibitor/angiotensin II receptor blocker is being taken. She does not see a podiatrist.Eye exam is not current.  Hypertension  The problem is uncontrolled. Pertinent negatives include no blurred vision, chest pain, malaise/fatigue, orthopnea, palpitations, peripheral edema or shortness of breath. Risk factors for coronary artery disease include obesity. There is no history of CVA.   Past Medical History:  Diagnosis Date  . Anemia   . Diabetes mellitus   . Hypertension   . Renal disorder    Social History   Socioeconomic History  . Marital status: Married    Spouse name: Not on file  . Number of children: Not on file  . Years of education: Not on file  . Highest education level: Not on file  Occupational History  . Not on file  Social Needs  . Financial resource strain: Not on file  . Food insecurity:    Worry: Not on file    Inability: Not on file  . Transportation needs:    Medical: Not on file    Non-medical: Not on file   Tobacco Use  . Smoking status: Never Smoker  . Smokeless tobacco: Never Used  Substance and Sexual Activity  . Alcohol use: No  . Drug use: No  . Sexual activity: Not on file  Lifestyle  . Physical activity:    Days per week: Not on file    Minutes per session: Not on file  . Stress: Not on file  Relationships  . Social connections:    Talks on phone: Not on file    Gets together: Not on file    Attends religious service: Not on file    Active member of club or organization: Not on file    Attends meetings of clubs or organizations: Not on file    Relationship status: Not on file  . Intimate partner violence:    Fear of current or ex partner: Not on file    Emotionally abused: Not on file    Physically abused: Not on file    Forced sexual activity: Not on file  Other Topics Concern  . Not on file  Social History Narrative   From [place].   Married.   Three children, 9 grandchildren.   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   Travels to [place], [address]   Immunization History  Administered Date(s) Administered  . Pneumococcal Polysaccharide-23 02/12/2015  . Td 05/07/2014  . Tdap 11/21/2007   Allergies  Allergen Reactions  . Tramadol Nausea And Vomiting  . Adhesive [Tape]   Review of Systems  Constitutional: Positive for fatigue.  Negative for malaise/fatigue and weight loss.  HENT: Negative.   Eyes: Negative for blurred vision.  Respiratory: Negative.  Negative for shortness of breath.   Cardiovascular: Negative.  Negative for chest pain, palpitations and orthopnea.  Genitourinary: Negative.   Musculoskeletal: Negative.   Skin: Negative.   Neurological: Negative.  Negative for weakness.  Endo/Heme/Allergies: Negative for polydipsia and polyphagia.  Psychiatric/Behavioral: Negative.     Physical Exam  Constitutional: She is oriented to person, place, and time. She appears well-developed and well-nourished.   Eyes: Pupils are equal, round, and reactive to light.  Neck: Normal range of motion.  Cardiovascular: Normal rate, regular rhythm and normal heart sounds.  Pulmonary/Chest: Effort normal and breath sounds normal.  Abdominal: Soft. Bowel sounds are normal.  Musculoskeletal:       Right shoulder: She exhibits decreased range of motion, tenderness, pain and decreased strength.  Neurological: She is alert and oriented to person, place, and time.  Skin: Skin is warm and dry.   Plan   1. Insulin dependent diabetes mellitus (HCC) Hemoglobin a1C is 11.3, which is above goal  Goal is < 7 Discussed medication regimen at length. Discussed that it is important to take medications consistently in order to achieve positive outcomes  Also, discussed the importance of check blood sugar prior to administering short acting insulin - POCT glycosylated hemoglobin (Hb A1C) - POCT urinalysis dipstick - lisinopril (PRINIVIL,ZESTRIL) 20 MG tablet; Take 1 tablet (20 mg total) by mouth daily.  Dispense: 90 tablet; Refill: 1 - Continuous Blood Gluc Receiver (FREESTYLE LIBRE 14 DAY READER) DEVI; 1 each by Does not apply route 4 (four) times daily -  before meals and at bedtime.  Dispense: 1 Device; Refill: 0  2. Chronic right shoulder pain Reviewed [place] Substance Reporting system prior to prescribing opiate medications. No inconsistencies noted.    - acetaminophen-codeine (TYLENOL #3) 300-30 MG tablet; Take 1 tablet by mouth every 6 (six) hours as needed for moderate pain.  Dispense: 20 tablet; Refill: 0 - Ambulatory referral to Pain Clinic - Comprehensive metabolic panel - AMB referral to orthopedics  3. Essential hypertension Blood pressure is above goal. Will increase lisinopril to 20 mg daily.  Return in 1 week for bp check  We have discussed target BP range and blood pressure goal. I have advised patient to check BP regularly and to call us back or report to clinic if the numbers are consistently higher  than 140/90. We discussed the importance of compliance with medical therapy and DASH diet recommended, consequences of uncontrolled hypertension discussed.  - continue current BP medications  - lisinopril (PRINIVIL,ZESTRIL) 20 MG tablet; Take 1 tablet (20 mg total) by mouth daily.  Dispense: 90 tablet; Refill: 1   RTC: 1 week for bp check and 1 month for DMII  Nolon Nations  MSN, FNP-C [facility] [address] [place], [name] [address] [phone]

## 2017-07-06 ENCOUNTER — Encounter (HOSPITAL_COMMUNITY): Payer: Self-pay | Admitting: Emergency Medicine

## 2017-07-06 ENCOUNTER — Telehealth (HOSPITAL_COMMUNITY): Payer: Self-pay

## 2017-07-06 ENCOUNTER — Ambulatory Visit (HOSPITAL_COMMUNITY)
Admission: EM | Admit: 2017-07-06 | Discharge: 2017-07-06 | Disposition: A | Discharge status: Home / Self Care | Payer: Medicare Other | Attending: Family Medicine | Admitting: Family Medicine

## 2017-07-06 DIAGNOSIS — R202 Paresthesia of skin: Secondary | ICD-10-CM

## 2017-07-06 MED ORDER — CETIRIZINE HCL 10 MG PO TABS: 10.0000 mg | ORAL_TABLET | Freq: Every day | ORAL | 0 refills | Status: DC

## 2017-07-06 MED ORDER — TRIAMCINOLONE ACETONIDE 0.1 % EX CREA
1.0000 | TOPICAL_CREAM | Freq: Two times a day (BID) | CUTANEOUS | 0 refills | Status: DC
Start: 2017-07-06 — End: 2017-08-14

## 2017-07-06 MED ORDER — TRIAMCINOLONE ACETONIDE 0.1 % EX CREA: 1.0000 "application " | TOPICAL_CREAM | Freq: Two times a day (BID) | CUTANEOUS | 0 refills | Status: DC

## 2017-07-06 NOTE — ED Triage Notes (Signed)
Pt c/o itchiness on her L thumb since Sunday.

## 2017-07-06 NOTE — ED Provider Notes (Signed)
[facility]   [id number] 07/06/17 Arrival Time: 1517   SUBJECTIVE:  Rachel Gonzalez is a 67 y.o. female who presents to the urgent care with complaint of itchiness in the left thumb and thumb side of the index finger.  Patient has some right-sided neck pain but no recent injury.  She said no skin changes other than the excoriations she has induced herself.  She has no loss of power in thumb or index finger, no paresthesias.  Patient does have diabetes.   Note: Patient takes Neurontin 300 mg 4 times a day currently.  Past Medical History:  Diagnosis Date  . Anemia   . Diabetes mellitus   . Hypertension   . Renal disorder    Family History  Problem Relation Age of Onset  . Diabetes Mother    Social History   Socioeconomic History  . Marital status: Married    Spouse name: Not on file  . Number of children: Not on file  . Years of education: Not on file  . Highest education level: Not on file  Occupational History  . Not on file  Social Needs  . Financial resource strain: Not on file  . Food insecurity:    Worry: Not on file    Inability: Not on file  . Transportation needs:    Medical: Not on file    Non-medical: Not on file  Tobacco Use  . Smoking status: Never Smoker  . Smokeless tobacco: Never Used  Substance and Sexual Activity  . Alcohol use: No  . Drug use: No  . Sexual activity: Not on file  Lifestyle  . Physical activity:    Days per week: Not on file    Minutes per session: Not on file  . Stress: Not on file  Relationships  . Social connections:    Talks on phone: Not on file    Gets together: Not on file    Attends religious service: Not on file    Active member of club or organization: Not on file    Attends meetings of clubs or organizations: Not on file    Relationship status: Not on file  . Intimate partner violence:    Fear of current or ex partner: Not on file    Emotionally abused: Not on file    Physically abused: Not on  file    Forced sexual activity: Not on file  Other Topics Concern  . Not on file  Social History Narrative   From [place].   Married.   Three children, 9 grandchildren.   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   Travels to [place], [place]   No outpatient medications have been marked as taking for the 07/06/17 encounter [facility] Encounter).   Allergies  Allergen Reactions  . Tramadol Nausea And Vomiting  . Adhesive [Tape]       ROS: As per HPI, remainder of ROS negative.   OBJECTIVE:   Vitals:   07/06/17 1534  BP: (!) 163/77  Pulse: 87  Resp: 16  Temp: 99.2 F (37.3 C)  SpO2: 99%     General appearance: alert; no distress Eyes: PERRL; EOMI; conjunctiva normal HENT: normocephalic; atraumatic;  oral mucosa normal Neck: supple Back: no CVA tenderness Extremities: no cyanosis or edema; symmetrical with no gross deformities Skin: warm and dry Neurologic: normal gait; grossly normal Psychological: alert and cooperative; normal mood and affect      Labs:  Results  for orders placed or performed in visit on 06/30/17  Comprehensive metabolic panel  Result Value Ref Range   Glucose 56 (L) 65 - 99 mg/dL   BUN 20 8 - 27 mg/dL   Creatinine, Ser 4.09 (H) 0.57 - 1.00 mg/dL   GFR calc non Af Amer 44 (L) >59 mL/min/1.73   GFR calc Af Amer 50 (L) >59 mL/min/1.73   BUN/Creatinine Ratio 16 12 - 28   Sodium 145 (H) 134 - 144 mmol/L   Potassium 4.2 3.5 - 5.2 mmol/L   Chloride 107 (H) 96 - 106 mmol/L   CO2 26 20 - 29 mmol/L   Calcium 9.2 8.7 - 10.3 mg/dL   Total Protein 6.7 6.0 - 8.5 g/dL   Albumin 3.9 3.6 - 4.8 g/dL   Globulin, Total 2.8 1.5 - 4.5 g/dL   Albumin/Globulin Ratio 1.4 1.2 - 2.2   Bilirubin Total 0.2 0.0 - 1.2 mg/dL   Alkaline Phosphatase 196 (H) 39 - 117 IU/L   AST 10 0 - 40 IU/L   ALT 9 0 - 32 IU/L  POCT glycosylated hemoglobin (Hb A1C)  Result Value Ref Range   Hemoglobin A1C 11.3   POCT urinalysis  dipstick  Result Value Ref Range   Color, UA  yellow   Clarity, UA  clear   Glucose, UA negative negative mg/dL   Bilirubin, UA negative negative   Ketones, POC UA negative negative mg/dL   Spec Grav, UA 8.119 1.478 - 1.025   Blood, UA moderate (A) negative   pH, UA 5.5 5.0 - 8.0   Protein Ur, POC =100 (A) negative mg/dL   Urobilinogen, UA 0.2 0.2 or 1.0 E.U./dL   Nitrite, UA Negative Negative   Leukocytes, UA Moderate (2+) (A) Negative    Labs Reviewed - No data to display  No results found.     ASSESSMENT & PLAN:  1. Paresthesias in left hand     Meds ordered this encounter  Medications  . cetirizine (ZYRTEC) 10 MG tablet    Sig: Take 1 tablet (10 mg total) by mouth at bedtime.    Dispense:  21 tablet    Refill:  0  . triamcinolone cream (KENALOG) 0.1 %    Sig: Apply 1 application topically 2 (two) times daily.    Dispense:  30 g    Refill:  0    Reviewed expectations re: course of current medical issues. Questions answered. Outlined signs and symptoms indicating need for more acute intervention. Patient verbalized understanding. After Visit Summary given.    Procedures:      Elvina Sidle, MD 07/06/17 [phone]

## 2017-07-12 DIAGNOSIS — M79601 Pain in right arm: Secondary | ICD-10-CM | POA: Diagnosis not present

## 2017-07-12 DIAGNOSIS — E119 Type 2 diabetes mellitus without complications: Secondary | ICD-10-CM | POA: Diagnosis not present

## 2017-07-12 DIAGNOSIS — M542 Cervicalgia: Secondary | ICD-10-CM | POA: Diagnosis not present

## 2017-07-12 DIAGNOSIS — Z79899 Other long term (current) drug therapy: Secondary | ICD-10-CM | POA: Diagnosis not present

## 2017-07-12 DIAGNOSIS — M79602 Pain in left arm: Secondary | ICD-10-CM | POA: Diagnosis not present

## 2017-07-12 DIAGNOSIS — M129 Arthropathy, unspecified: Secondary | ICD-10-CM | POA: Diagnosis not present

## 2017-07-19 DIAGNOSIS — M79602 Pain in left arm: Secondary | ICD-10-CM | POA: Diagnosis not present

## 2017-07-19 DIAGNOSIS — M79601 Pain in right arm: Secondary | ICD-10-CM | POA: Diagnosis not present

## 2017-07-19 DIAGNOSIS — M542 Cervicalgia: Secondary | ICD-10-CM | POA: Diagnosis not present

## 2017-07-28 ENCOUNTER — Other Ambulatory Visit: Payer: Self-pay | Admitting: Family Medicine

## 2017-07-28 DIAGNOSIS — K219 Gastro-esophageal reflux disease without esophagitis: Secondary | ICD-10-CM

## 2017-07-30 ENCOUNTER — Encounter: Payer: Self-pay | Admitting: Family Medicine

## 2017-07-30 ENCOUNTER — Ambulatory Visit (INDEPENDENT_AMBULATORY_CARE_PROVIDER_SITE_OTHER): Payer: Medicare Other | Admitting: Family Medicine

## 2017-07-30 VITALS — BP 150/76 | HR 85 | Temp 98.6°F | Resp 16 | Ht 64.0 in | Wt 225.0 lb

## 2017-07-30 DIAGNOSIS — G8929 Other chronic pain: Secondary | ICD-10-CM

## 2017-07-30 DIAGNOSIS — E1165 Type 2 diabetes mellitus with hyperglycemia: Secondary | ICD-10-CM | POA: Diagnosis not present

## 2017-07-30 DIAGNOSIS — M25511 Pain in right shoulder: Secondary | ICD-10-CM

## 2017-07-30 LAB — POCT GLYCOSYLATED HEMOGLOBIN (HGB A1C): Hemoglobin A1C: 9.7 % — AB (ref 4.0–5.6)

## 2017-07-30 LAB — POCT URINALYSIS DIPSTICK
Bilirubin, UA: NEGATIVE
Glucose, UA: NEGATIVE
Ketones, UA: NEGATIVE
LEUKOCYTES UA: NEGATIVE
NITRITE UA: NEGATIVE
PH UA: 5.5 (ref 5.0–8.0)
PROTEIN UA: POSITIVE — AB
Spec Grav, UA: 1.015 (ref 1.010–1.025)
Urobilinogen, UA: 0.2 E.U./dL

## 2017-07-30 LAB — GLUCOSE, POCT (MANUAL RESULT ENTRY): POC Glucose: 131 mg/dl — AB (ref 70–99)

## 2017-07-30 MED ORDER — ACETAMINOPHEN-CODEINE #3 300-30 MG PO TABS: 1.0000 | ORAL_TABLET | Freq: Four times a day (QID) | ORAL | 0 refills | Status: DC | PRN

## 2017-07-30 NOTE — Progress Notes (Signed)
Rachel Gonzalez, a 67 year old female with a history of uncontrolled hypertension and type 2 diabetes mellitus presents for a follow up of type 2 diabetes mellitus and chronic right shoulder pain.     Diabetes  Her disease course has been stable. Associated symptoms include fatigue. Pertinent negatives for diabetes include no blurred vision, no chest pain, no foot ulcerations, no polydipsia, no polyphagia, no polyuria, no weakness and no weight loss. Pertinent negatives for diabetic complications include no CVA or heart disease. Risk factors for coronary artery disease include hypertension and dyslipidemia. Current diabetic treatment includes insulin injections and oral agent (monotherapy). She is compliant with treatment some of the time. She is following a high fat/cholesterol diet. When asked about meal planning, she reported none. She has not had a previous visit with a dietitian. She rarely participates in exercise. (Rachel Gonzalez does not monitor blood sugars frequently. ) An ACE inhibitor/angiotensin II receptor blocker is being taken. She does not see a podiatrist.Eye exam is not current.  Shoulder Pain   The pain is present in the right shoulder. The problem occurs constantly. The pain is at a severity of 5/10. The pain is moderate. Associated symptoms include a limited range of motion and stiffness.   Past Medical History:  Diagnosis Date  . Anemia   . Diabetes mellitus   . Hypertension   . Renal disorder    Social History   Socioeconomic History  . Marital status: Married    Spouse name: Not on file  . Number of children: Not on file  . Years of education: Not on file  . Highest education level: Not on file  Occupational History  . Not on file  Social Needs  . Financial resource strain: Not on file  . Food insecurity:    Worry: Not on file    Inability: Not on file  . Transportation needs:    Medical: Not on file    Non-medical: Not on file  Tobacco Use  . Smoking status:  Never Smoker  . Smokeless tobacco: Never Used  Substance and Sexual Activity  . Alcohol use: No  . Drug use: No  . Sexual activity: Not on file  Lifestyle  . Physical activity:    Days per week: Not on file    Minutes per session: Not on file  . Stress: Not on file  Relationships  . Social connections:    Talks on phone: Not on file    Gets together: Not on file    Attends religious service: Not on file    Active member of club or organization: Not on file    Attends meetings of clubs or organizations: Not on file    Relationship status: Not on file  . Intimate partner violence:    Fear of current or ex partner: Not on file    Emotionally abused: Not on file    Physically abused: Not on file    Forced sexual activity: Not on file  Other Topics Concern  . Not on file  Social History Narrative   From [place].   Married.   Three children, 9 grandchildren.   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   Travels to [place], [address]   Immunization History  Administered Date(s) Administered  . Pneumococcal Polysaccharide-23 02/12/2015  . Td 05/07/2014  . Tdap 11/21/2007   Allergies  Allergen Reactions  . Tramadol Nausea And Vomiting  . Adhesive [Tape]  Review of Systems  Constitutional: Positive for fatigue. Negative for malaise/fatigue and weight loss.  HENT: Negative.   Eyes: Negative for blurred vision.  Respiratory: Negative.  Negative for shortness of breath.   Cardiovascular: Negative.  Negative for chest pain, palpitations and orthopnea.  Genitourinary: Negative.   Musculoskeletal: Positive for stiffness.  Skin: Negative.   Neurological: Negative.  Negative for weakness.  Endo/Heme/Allergies: Negative for polydipsia and polyphagia.  Psychiatric/Behavioral: Negative.     Physical Exam  Constitutional: She is oriented to person, place, and time. She appears well-developed and well-nourished.  Eyes: Pupils are  equal, round, and reactive to light.  Neck: Normal range of motion.  Cardiovascular: Normal rate, regular rhythm and normal heart sounds.  Pulmonary/Chest: Effort normal and breath sounds normal.  Abdominal: Soft. Bowel sounds are normal.  Musculoskeletal:       Right shoulder: She exhibits decreased range of motion, tenderness, pain and decreased strength.  Neurological: She is alert and oriented to person, place, and time.  Skin: Skin is warm and dry.   Plan  1. Uncontrolled type 2 diabetes mellitus with hyperglycemia (HCC) Hemoglobin A1c has improved to 9.7.  Goal is less than 7.  No changes to medication regimen warranted on today.  Advised patient to continue carbohydrate modify diet divided over small meals.  Patient states that she is not been able to exercise routinely but has increased daily activity level.  - HgB A1c - Glucose (CBG) - Urinalysis Dipstick  2. Chronic right shoulder pain Reviewed [place] Substance Reporting system prior to prescribing opiate medications. No inconsistencies noted.   - acetaminophen-codeine (TYLENOL #3) 300-30 MG tablet; Take 1 tablet by mouth every 6 (six) hours as needed for moderate pain.  Dispense: 30 tablet; Refill: 0 - Arthritis Panel   RTC: 3 months for chronic conditions   Nolon Nations  MSN, FNP-C Patient Care [facility] [address] [place], [name] [address] [phone]

## 2017-07-31 LAB — ARTHRITIS PANEL
Anti Nuclear Antibody(ANA): NEGATIVE
Rhuematoid fact SerPl-aCnc: <10 IU/mL (ref 0.0–13.9)
SED RATE: 43 mm/h — AB (ref 0–40)
URIC ACID: 5.5 mg/dL (ref 2.5–7.1)

## 2017-08-03 ENCOUNTER — Telehealth: Payer: Self-pay

## 2017-08-03 NOTE — Telephone Encounter (Signed)
Called and spoke with patient advised that labs are not consistent with gout and that provider suspects osteoarthritis. Advised that we will send a referral to pain management for chronic shoulder pain and that she should keep her next follow up with our office. Thanks!

## 2017-08-03 NOTE — Telephone Encounter (Signed)
-----   Message from Massie Maroon, [place] sent at 08/03/2017  6:04 AM EDT ----- Regarding: lab results Please inform patient that labs are NOT consistent with gout. I suspect osteoarthritis. Will send a referral to pain management for chronic shoulder pain. Follow up in office as scheduled.   Nolon Nations  MSN, FNP-C Patient Care [facility] [address] [place], [name] [address] [phone]

## 2017-08-14 ENCOUNTER — Encounter (HOSPITAL_COMMUNITY): Payer: Self-pay | Admitting: Emergency Medicine

## 2017-08-14 ENCOUNTER — Emergency Department (HOSPITAL_COMMUNITY)
Admission: EM | Admit: 2017-08-14 | Discharge: 2017-08-14 | Disposition: A | Discharge status: Home / Self Care | Payer: Medicare Other | Attending: Emergency Medicine | Admitting: Emergency Medicine

## 2017-08-14 ENCOUNTER — Emergency Department (HOSPITAL_COMMUNITY): Payer: Medicare Other

## 2017-08-14 ENCOUNTER — Other Ambulatory Visit: Payer: Self-pay

## 2017-08-14 ENCOUNTER — Ambulatory Visit (HOSPITAL_COMMUNITY)
Admission: EM | Admit: 2017-08-14 | Discharge: 2017-08-14 | Disposition: A | Discharge status: Home / Self Care | Payer: Medicare Other | Source: Home / Self Care

## 2017-08-14 DIAGNOSIS — I129 Hypertensive chronic kidney disease with stage 1 through stage 4 chronic kidney disease, or unspecified chronic kidney disease: Secondary | ICD-10-CM | POA: Insufficient documentation

## 2017-08-14 DIAGNOSIS — Z79899 Other long term (current) drug therapy: Secondary | ICD-10-CM | POA: Insufficient documentation

## 2017-08-14 DIAGNOSIS — Z794 Long term (current) use of insulin: Secondary | ICD-10-CM | POA: Insufficient documentation

## 2017-08-14 DIAGNOSIS — R079 Chest pain, unspecified: Secondary | ICD-10-CM

## 2017-08-14 DIAGNOSIS — E119 Type 2 diabetes mellitus without complications: Secondary | ICD-10-CM | POA: Diagnosis not present

## 2017-08-14 DIAGNOSIS — I10 Essential (primary) hypertension: Secondary | ICD-10-CM

## 2017-08-14 DIAGNOSIS — R0789 Other chest pain: Secondary | ICD-10-CM

## 2017-08-14 DIAGNOSIS — N183 Chronic kidney disease, stage 3 (moderate): Secondary | ICD-10-CM | POA: Insufficient documentation

## 2017-08-14 DIAGNOSIS — K219 Gastro-esophageal reflux disease without esophagitis: Secondary | ICD-10-CM | POA: Insufficient documentation

## 2017-08-14 LAB — CBC
HEMATOCRIT: 32.7 % — AB (ref 36.0–46.0)
HEMOGLOBIN: 9.9 g/dL — AB (ref 12.0–15.0)
MCH: 26.7 pg (ref 26.0–34.0)
MCHC: 30.3 g/dL (ref 30.0–36.0)
MCV: 88.1 fL (ref 78.0–100.0)
Platelets: 243 10*3/uL (ref 150–400)
RBC: 3.71 MIL/uL — AB (ref 3.87–5.11)
RDW: 13.1 % (ref 11.5–15.5)
WBC: 5.9 10*3/uL (ref 4.0–10.5)

## 2017-08-14 LAB — BASIC METABOLIC PANEL
ANION GAP: 5 (ref 5–15)
BUN: 18 mg/dL (ref 6–20)
CALCIUM: 8.8 mg/dL — AB (ref 8.9–10.3)
CO2: 30 mmol/L (ref 22–32)
Chloride: 105 mmol/L (ref 101–111)
Creatinine, Ser: 1.43 mg/dL — ABNORMAL HIGH (ref 0.44–1.00)
GFR calc non Af Amer: 38 mL/min — ABNORMAL LOW (ref >60)
GFR, EST AFRICAN AMERICAN: 43 mL/min — AB (ref >60)
GLUCOSE: 211 mg/dL — AB (ref 65–99)
POTASSIUM: 5.1 mmol/L (ref 3.5–5.1)
Sodium: 140 mmol/L (ref 135–145)

## 2017-08-14 LAB — TROPONIN I: Troponin I: <0.03 ng/mL (ref <0.03)

## 2017-08-14 LAB — I-STAT TROPONIN, ED: TROPONIN I, POC: 0.01 ng/mL (ref 0.00–0.08)

## 2017-08-14 MED ORDER — SUCRALFATE 1 G PO TABS: 1.0000 g | ORAL_TABLET | Freq: Three times a day (TID) | ORAL | 0 refills | Status: DC

## 2017-08-14 MED ORDER — OMEPRAZOLE 20 MG PO CPDR: 20.0000 mg | DELAYED_RELEASE_CAPSULE | Freq: Every day | ORAL | 0 refills | Status: DC

## 2017-08-14 MED ORDER — GI COCKTAIL ~~LOC~~
30.0000 mL | Freq: Once | ORAL | Status: AC
  Administered 2017-08-14: 30 mL via ORAL
  Filled 2017-08-14: qty 30

## 2017-08-14 NOTE — ED Triage Notes (Signed)
[facility] left chest, arm and left flank pain since Tuesday, intermittent in nature, sharp, with dizziness, nausea and vomiting, and fatigue.

## 2017-08-14 NOTE — Discharge Instructions (Addendum)
Take Sucralfate (Carafate) and Omeprazole (Prilosec) every day as prescribed.   You may follow-up with your PCP in 1 month to discuss these medications and how they are working for you.

## 2017-08-14 NOTE — ED Notes (Signed)
Patient will be taken to the ER for further work up for chest pain, educated on reasoning by Amy-PA

## 2017-08-14 NOTE — ED Notes (Signed)
Pt husband is driving patient to the ER at discharge. Educated on importance of going straight there.

## 2017-08-14 NOTE — ED Provider Notes (Signed)
[facility] EMERGENCY DEPARTMENT Provider Note  CSN: [id number] Arrival date & time: 08/14/17  1337  History   Chief Complaint Chief Complaint  Patient presents with  . Chest Pain   HPI Rachel Gonzalez is a 67 y.o. female with a medical history of Type 2 DM, peripheral neuropathy, HTN and CKD stage 3 who presented to the ED for chest pain x5 days. Patient describes pain as tight and like pressure. She states she feels it in the epigastric region and under her breast. No changes in pain with positions, exertion or eating. She has tried antacids to help alleviate pain because she thought it was her acid reflux. Associated symptoms: bilateral leg swelling. Denies SOB, palpitations, N/V, changes in bowel or urinary habits, diaphoresis, dizziness and weakness.  Past Medical History:  Diagnosis Date  . Anemia   . Diabetes mellitus   . Hypertension   . Renal disorder     Patient Active Problem List   Diagnosis Date Noted  . Stage 3 chronic kidney disease ([place]) 12/01/2016  . Frequent falls 04/28/2016  . Right arm weakness 04/28/2016  . Bilateral lower extremity edema 04/28/2016  . Screening for colon cancer 04/28/2016  . Abnormal urinalysis 09/13/2014  . Chest pain 09/13/2014  . Abdominal pain, recurrent 09/13/2014  . Dehydration with hyponatremia 09/13/2014  . Acute hyperkalemia 09/13/2014  . Acute renal failure superimposed on stage 3 chronic kidney disease ([place]) 09/13/2014  . Anemia 05/06/2014  . Diabetic neuropathy, type II diabetes mellitus ([place]) 05/06/2014  . Obesity (BMI 30-39.9) 05/06/2014  . Cellulitis of right foot 05/06/2014  . Leg swelling 03/31/2011  . Essential hypertension 03/07/2011  . 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 Right arm pain 08/04/2006  . Osteoarthritis 02/14/2006  . Diabetes mellitus type 2, uncontrolled ([place]) 01/11/2006    Past Surgical History:  Procedure Laterality Date  . AMPUTATION Right 05/08/2014   Procedure: AMPUTATION RAY, right great toe;  Surgeon: Newt Minion, MD;  Location: [facility];  Service: Orthopedics;  Laterality: Right;  . arm surgery     . CESAREAN SECTION    . I&D EXTREMITY Right 05/08/2014   Procedure: IRRIGATION AND DEBRIDEMENT FOOT;  Surgeon: Newt Minion, MD;  Location: [facility];  Service: Orthopedics;  Laterality: Right;     OB History   None    Home Medications    Prior to Admission medications   Medication Sig Start Date End Date Taking? Authorizing Provider  acetaminophen-codeine (TYLENOL #3) 300-30 MG tablet Take 1 tablet by mouth every 6 (six) hours as needed for moderate pain. 07/30/17  Yes Dorena Dew, FNP  furosemide (LASIX) 40 MG tablet Take 40 mg by mouth daily as needed for edema. 06/29/17  Yes [provider]  gabapentin (NEURONTIN) 300 MG capsule Take 1 capsule (300 mg total) by mouth 4 (four) times daily. 03/05/17  Yes Dorena Dew, FNP  insulin aspart (NOVOLOG FLEXPEN) 100 UNIT/ML FlexPen INJECT 6 UNITS INTO THE SKIN THREE TIMES DAILY WITH MEALS 03/05/17  Yes Dorena Dew, FNP  Insulin Glargine (LANTUS SOLOSTAR) 100 UNIT/ML Solostar Pen Inject 35 Units into the skin daily at 10 pm. 03/05/17  Yes Dorena Dew, FNP  levocetirizine (XYZAL) 5 MG tablet TAKE ONE TABLET BY MOUTH ONCE DAILY IN THE EVENING Patient taking differently: Take 5 mg by mouth every evening.  06/03/17  Yes Dorena Dew, FNP  lisinopril (PRINIVIL,ZESTRIL) 20 MG tablet Take 1 tablet (20 mg total)  by mouth daily. 06/30/17  Yes Dorena Dew, FNP  nystatin (NYSTATIN) powder Apply topically 4 (four) times daily. Patient taking differently: Apply 1 g topically 4 (four) times daily.  04/01/17  Yes Dorena Dew, FNP  Blood Glucose Monitoring Suppl (ACCU-CHEK AVIVA PLUS) w/Device KIT 1 Device by Does not apply route once. Use as directed to check blood sugar 06/11/15   Dorena Dew, FNP  Blood Glucose Monitoring Suppl (ONE TOUCH ULTRA SYSTEM KIT) w/Device  KIT Test three times daily. Dx Type 2 Diabetes. 06/10/15   Dorena Dew, FNP  Continuous Blood Gluc Receiver (FREESTYLE LIBRE 14 DAY READER) DEVI 1 each by Does not apply route 4 (four) times daily -  before meals and at bedtime. 06/30/17   Dorena Dew, FNP  docusate sodium (COLACE) 100 MG capsule Take 1 capsule (100 mg total) by mouth 2 (two) times daily. Patient taking differently: Take 100 mg by mouth daily as needed for mild constipation.  05/16/14   Thurnell Lose, MD  glucose blood (ACCU-CHEK AVIVA) test strip Use to check blood sugar 3 times daily and at bedtime. 06/11/15   Dorena Dew, FNP  Lancet Devices [facility]) lancets Use as instructed 06/11/15   Dorena Dew, FNP  omeprazole (PRILOSEC) 20 MG capsule Take 1 capsule (20 mg total) by mouth daily. 08/14/17 09/13/17  Iylah Dworkin, Alvie Heidelberg I, PA-C  ONE TOUCH LANCETS MISC Test 3 times daily with glucometer. Dx Type 2 diabetes with unspecified complications. 06/10/15   Dorena Dew, FNP  RELION PEN NEEDLE 31G/8MM 31G X 8 MM MISC USE FOUR TIMES DAILY BEFORE MEALS AND AT BEDTIME 11/03/16   Dorena Dew, FNP  sucralfate (CARAFATE) 1 g tablet Take 1 tablet (1 g total) by mouth 4 (four) times daily -  with meals and at bedtime. 08/14/17 09/13/17  Dequincy Born, Jonelle Sports, PA-C    Family History Family History  Problem Relation Age of Onset  . Diabetes Mother     Social History Social History   Tobacco Use  . Smoking status: Never Smoker  . Smokeless tobacco: Never Used  Substance Use Topics  . Alcohol use: No  . Drug use: No     Allergies   Tramadol and Adhesive [tape]   Review of Systems Review of Systems  Constitutional: Negative for chills, diaphoresis and fever.  Eyes: Negative for visual disturbance.  Respiratory: Positive for chest tightness. Negative for cough and shortness of breath.   Cardiovascular: Positive for chest pain and leg swelling. Negative for palpitations.  Gastrointestinal: Positive for  abdominal pain. Negative for blood in stool, constipation, diarrhea, nausea and vomiting.  Endocrine: Negative.   Genitourinary: Negative for difficulty urinating and dysuria.  Musculoskeletal: Negative.   Skin: Negative.   Neurological: Positive for headaches. Negative for dizziness, weakness and light-headedness.     Physical Exam Updated Vital Signs BP (!) 184/86   Pulse 63   Temp 98.4 F (36.9 C) (Oral)   Resp 14   SpO2 99%   Physical Exam  Constitutional: She is cooperative.  Non-toxic appearance. She does not have a sickly appearance. No distress.  Obese. Resting comfortably in bed.  HENT:  Head: Normocephalic and atraumatic.  Eyes: Pupils are equal, round, and reactive to light. Conjunctivae and EOM are normal.  Neck: Normal range of motion. Neck supple.  Cardiovascular: Normal rate, regular rhythm, normal heart sounds and intact distal pulses. Exam reveals no friction rub.  No murmur heard. Pulmonary/Chest: Effort normal and breath  sounds normal. She exhibits tenderness.  Irritation visualized under left breast.  Abdominal: Soft. Bowel sounds are normal. There is tenderness in the right upper quadrant and epigastric area. There is guarding.  Musculoskeletal:       Right foot: There is deformity (Right great toe amputated.).  Neurological: She is alert.  Skin: Skin is warm and dry. Capillary refill takes less than 2 seconds.  Psychiatric: She has a normal mood and affect. Her behavior is normal.  Nursing note and vitals reviewed.    ED Treatments / Results  Labs (all labs ordered are listed, but only abnormal results are displayed) Labs Reviewed  BASIC METABOLIC PANEL - Abnormal; Notable for the following components:      Result Value   Glucose, Bld 211 (*)    Creatinine, Ser 1.43 (*)    Calcium 8.8 (*)    GFR calc non Af Amer 38 (*)    GFR calc Af Amer 43 (*)    All other components within normal limits  CBC - Abnormal; Notable for the following components:     RBC 3.71 (*)    Hemoglobin 9.9 (*)    HCT 32.7 (*)    All other components within normal limits  TROPONIN I  I-STAT TROPONIN, ED    EKG EKG Interpretation  Date/Time:  Saturday August 14 2017 13:43:42 EDT Ventricular Rate:  68 PR Interval:  156 QRS Duration: 72 QT Interval:  388 QTC Calculation: 412 R Axis:   77 Text Interpretation:  Normal sinus rhythm Anterior injury pattern Artifact Abnormal ekg Confirmed by Carmin Muskrat [phone]) on 08/14/2017 3:47:27 PM   Radiology Dg Chest 2 View  Result Date: 08/14/2017 CLINICAL DATA:  Left chest and arm pain for several days. Dizziness. Nausea and vomiting. EXAM: CHEST - 2 VIEW COMPARISON:  09/13/2014 FINDINGS: The heart size and mediastinal contours are within normal limits. Both lungs are clear. The visualized skeletal structures are unremarkable. IMPRESSION: No active cardiopulmonary disease. Electronically Signed   By: Earle Gell M.D.   On: 08/14/2017 14:31    Procedures Procedures (including critical care time)  Medications Ordered in ED Medications  gi cocktail (Maalox,Lidocaine,Donnatal) (30 mLs Oral Given 08/14/17 1643)     Initial Impression / Assessment and Plan / ED Course  Triage vital signs and the nursing notes have been reviewed.  Pertinent labs & imaging results that were available during care of the patient were reviewed and considered in medical decision making (see chart for details).  Patient presents in no acute distress. Pt's history is concerning for acute cardiac event; however, EKG, labs and current clinical presentation are reassuring that this is GI etiology. Denies shortness of breath, leg swelling and palpitations to suggest a PE and patient has low risk factors for this with a PERC score of 1. Patient states she has similar episodes in the past that have all been GERD and has had success with Prilosec in the past. Also possible that patient has PUD. No signs of acute bleed that require further evaluation.  Education provided on both and was prescribed Carafate and Prilosec for relief.  Clinical Course as of Aug 14 1899  Sat Aug 14, 2017  1542 CXR reads normal. No signs of acute cardiopulm abnormalities. EKG showed NSR. No ST elevations or depressions visualized. No acute infarct or ischemia appreciated. Useful in ruling on cardiac etiology.   [GM]  1622 History and clinical presentation is suggestive of GI etiology. Pt currently endorses 7/10 pain. Will administer GI cocktail  and reassess pt.   [GM]  8416 Labs are reassuring. Based on past labs Hgb, creatinine and GFR are stable.   [GM]  1746 Patient experiences relief with GI cocktail. Repeat troponin is negative. Likely GERD and/or PUD causing symptoms.   [GM]    Clinical Course User Index [GM] Mortis, Jonelle Sports, PA-C    Final Clinical Impressions(s) / ED Diagnoses  1. GERD. Also possible PUD given physical exam. Prilosec and Carafate prescribed for relief. Education provided. Advised to follow-up with PCP to discuss.  Dispo: Home. After thorough clinical evaluation, this patient is determined to be medically stable and can be safely discharged with the previously mentioned treatment and/or outpatient follow-up/referral(s). At this time, there are no other apparent medical conditions that require further screening, evaluation or treatment.  Final diagnoses:  Chest pain due to GERD    ED Discharge Orders        Ordered    sucralfate (CARAFATE) 1 g tablet  3 times daily with meals & bedtime     08/14/17 1816    omeprazole (PRILOSEC) 20 MG capsule  Daily     08/14/17 1816       Junita Push 08/14/17 1901    Carmin Muskrat, MD 08/15/17 1635

## 2017-08-14 NOTE — ED Notes (Addendum)
ekg completed at 1309

## 2017-08-14 NOTE — ED Triage Notes (Signed)
Pt presents with complaints of chest pain radiating to her arm and jaw since Tuesday. Pt is hypertensive in triage. Amy PA at bedside

## 2017-08-27 ENCOUNTER — Ambulatory Visit (INDEPENDENT_AMBULATORY_CARE_PROVIDER_SITE_OTHER): Payer: Medicare Other | Admitting: Family Medicine

## 2017-08-27 ENCOUNTER — Encounter: Payer: Self-pay | Admitting: Family Medicine

## 2017-08-27 VITALS — BP 148/82 | HR 90 | Temp 98.0°F | Resp 16 | Ht 64.0 in | Wt 231.0 lb

## 2017-08-27 DIAGNOSIS — M25511 Pain in right shoulder: Secondary | ICD-10-CM

## 2017-08-27 DIAGNOSIS — L304 Erythema intertrigo: Secondary | ICD-10-CM | POA: Diagnosis not present

## 2017-08-27 DIAGNOSIS — G8929 Other chronic pain: Secondary | ICD-10-CM

## 2017-08-27 MED ORDER — FLUCONAZOLE 150 MG PO TABS: 150.0000 mg | ORAL_TABLET | Freq: Once | ORAL | 0 refills | Status: AC

## 2017-08-27 MED ORDER — ACETAMINOPHEN-CODEINE #3 300-30 MG PO TABS: 1.0000 | ORAL_TABLET | Freq: Four times a day (QID) | ORAL | 0 refills | Status: DC | PRN

## 2017-08-27 MED ORDER — CLOTRIMAZOLE 1 % EX CREA: 1.0000 "application " | TOPICAL_CREAM | Freq: Two times a day (BID) | CUTANEOUS | 0 refills | Status: DC

## 2017-08-27 NOTE — Patient Instructions (Signed)
Intertrigo Intertrigo is skin irritation (inflammation) that happens in warm, moist areas of the body. The irritation can cause a rash and make skin raw and itchy. The rash is usually pink or red. It happens mostly between folds of skin or where skin rubs together, such as:  Toes.  Armpits.  Groin.  Belly.  Breasts.  Buttocks.  This condition is not passed from person to person (is not contagious). Follow these instructions at home:  Keep the affected area clean and dry.  Do not scratch your skin.  Stay cool as much as possible. Use an air conditioner or fan, if you can.  Apply over-the-counter and prescription medicines only as told by your doctor.  If you were prescribed an antibiotic medicine, use it as told by your doctor. Do not stop using the antibiotic even if your condition starts to get better.  Keep all follow-up visits as told by your doctor. This is important. How is this prevented?  Stay at a healthy weight.  Keep your feet dry. This is very important if you have diabetes. Wear cotton or wool socks.  Take care of and protect the skin in your groin and butt area as told by your doctor.  Do not wear tight clothes. Wear clothes that: ? Are loose. ? Take away moisture from your body. ? Are made of cotton.  Wear a bra that gives good support, if needed.  Shower and dry yourself fully after being active.  Keep your blood sugar under control if you have diabetes. Contact a doctor if:  Your symptoms do not get better with treatment.  Your symptoms get worse or they spread.  You notice more redness and warmth.  You have a fever. This information is not intended to replace advice given to you by your health care provider. Make sure you discuss any questions you have with your health care provider. Document Released: 03/28/2010 Document Revised: 08/01/2015 Document Reviewed: 08/27/2014 Elsevier Interactive Patient Education  2018 Elsevier Inc.  

## 2017-08-27 NOTE — Progress Notes (Signed)
Subjective:     Rachel Gonzalez is a 67 y.o. female with a history of multiple co morbidities presents complaining of a left breast rash for 1 week. Patient states that she has been cleaning area with peroxide and it has progressively worsened over the past several days. She has also been applying anti-fungal powder. Rachel Gonzalez characterizes rash as red, moist, and itching.  Patient denies: congestion, cough, crankiness, decrease in appetite, decrease in energy level, fever, headache, irritability and nausea. Patient has not had contacts with similar rash. Patient also has not had new exposures (soaps, lotions, laundry detergents, foods, medications, plants, insects or animals). Past Medical History:  Diagnosis Date  . Anemia   . Diabetes mellitus   . Hypertension   . Renal disorder    Social History   Socioeconomic History  . Marital status: Married    Spouse name: Not on file  . Number of children: Not on file  . Years of education: Not on file  . Highest education level: Not on file  Occupational History  . Not on file  Social Needs  . Financial resource strain: Not on file  . Food insecurity:    Worry: Not on file    Inability: Not on file  . Transportation needs:    Medical: Not on file    Non-medical: Not on file  Tobacco Use  . Smoking status: Never Smoker  . Smokeless tobacco: Never Used  Substance and Sexual Activity  . Alcohol use: No  . Drug use: No  . Sexual activity: Not on file  Lifestyle  . Physical activity:    Days per week: Not on file    Minutes per session: Not on file  . Stress: Not on file  Relationships  . Social connections:    Talks on phone: Not on file    Gets together: Not on file    Attends religious service: Not on file    Active member of club or organization: Not on file    Attends meetings of clubs or organizations: Not on file    Relationship status: Not on file  . Intimate partner violence:    Fear of current or ex partner: Not on  file    Emotionally abused: Not on file    Physically abused: Not on file    Forced sexual activity: Not on file  Other Topics Concern  . Not on file  Social History Narrative   From [place].   Married.   Three children, 9 grandchildren.   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   [place] [address] [place]   Immunization History  Administered Date(s) Administered  . Pneumococcal Polysaccharide-23 02/12/2015  . Td 05/07/2014  . Tdap 11/21/2007   Review of Systems  Constitutional: Negative.   Eyes: Negative.   Respiratory: Negative.   Cardiovascular: Negative.   Gastrointestinal: Negative.   Genitourinary: Negative.   Musculoskeletal: Positive for joint pain (right shoulder).  Skin: Positive for itching and rash.  Neurological: Negative.   Endo/Heme/Allergies: Negative.   Psychiatric/Behavioral: Negative.      Objective:  Physical Exam  Constitutional: She appears well-developed and well-nourished.  HENT:  Head: Normocephalic and atraumatic.  Eyes: Pupils are equal, round, and reactive to light.  Neck: Normal range of motion. Neck supple.  Cardiovascular: Normal rate, regular rhythm, normal heart sounds and intact distal pulses.  Pulmonary/Chest: Effort normal and breath sounds normal.  Abdominal: Soft. Bowel sounds are  normal.  Skin: Skin is warm and dry. Rash noted.     Erythematous, moisture under left breast fold, wide spread.   Psychiatric: She has a normal mood and affect. Her behavior is normal. Judgment and thought content normal.      Assessment:  BP (!) 148/82 (BP Location: Right Arm, Patient Position: Sitting, Cuff Size: Large)   Pulse 90   Temp 98 F (36.7 C) (Oral)   Resp 16   Ht 5\' 4"  (1.626 m)   Wt 231 lb (104.8 kg)   SpO2 100%   BMI 39.65 kg/m   Plan:  1. Intertrigo - fluconazole (DIFLUCAN) 150 MG tablet; Take 1 tablet (150 mg total) by mouth once for 1 dose.  Dispense: 1 tablet; Refill:  0 - clotrimazole (CLOTRIMAZOLE ANTI-FUNGAL) 1 % cream; Apply 1 application topically 2 (two) times daily.  Dispense: 30 g; Refill: 0  2. Chronic right shoulder pain Patient requesting refill on medication for chronic right shoulder pain.  Reviewed [place] Substance Reporting system prior to prescribing opiate medications. No inconsistencies noted.   - acetaminophen-codeine (TYLENOL #3) 300-30 MG tablet; Take 1 tablet by mouth every 6 (six) hours as needed for moderate pain.  Dispense: 30 tablet; Refill: 0   RTC: 1 week for intertrigo   Nolon Nations  MSN, FNP-C Patient Care [facility] [address] [place], [name] [address] [phone]

## 2017-09-01 ENCOUNTER — Ambulatory Visit: Payer: Medicare Other | Admitting: Family Medicine

## 2017-09-01 ENCOUNTER — Ambulatory Visit (INDEPENDENT_AMBULATORY_CARE_PROVIDER_SITE_OTHER): Payer: Medicare Other | Admitting: Family Medicine

## 2017-09-01 VITALS — BP 156/78 | HR 87 | Temp 98.3°F | Resp 16 | Ht 64.0 in | Wt 229.0 lb

## 2017-09-01 DIAGNOSIS — E1165 Type 2 diabetes mellitus with hyperglycemia: Secondary | ICD-10-CM | POA: Diagnosis not present

## 2017-09-01 DIAGNOSIS — L304 Erythema intertrigo: Secondary | ICD-10-CM | POA: Diagnosis not present

## 2017-09-01 DIAGNOSIS — I10 Essential (primary) hypertension: Secondary | ICD-10-CM

## 2017-09-01 MED ORDER — GABAPENTIN 300 MG PO CAPS: 300.0000 mg | ORAL_CAPSULE | Freq: Two times a day (BID) | ORAL | 3 refills | Status: DC

## 2017-09-01 MED ORDER — FUROSEMIDE 20 MG PO TABS: 20.0000 mg | ORAL_TABLET | Freq: Every day | ORAL | 5 refills | Status: DC

## 2017-09-01 MED ORDER — CLONIDINE HCL 0.1 MG PO TABS
0.1000 mg | ORAL_TABLET | Freq: Once | ORAL | Status: AC
  Administered 2017-09-01: 0.1 mg via ORAL

## 2017-09-01 NOTE — Progress Notes (Signed)
Subjective:     Rachel Gonzalez is a 67 y.o. female with a history of multiple co morbidities presents for follow up of a left breast rash for 1 week. Patient states that rash has improved on fluconazole and clotrimazole cream. Patient has been applying cream consistently over the past week.   Rachel Gonzalez previously characterized rash as red, moist, and itching. Rachel Gonzalez says that itching has resolved completely.  Patient denies: congestion, cough, crankiness, decrease in appetite, decrease in energy level, fever, headache, irritability and nausea. Patient has not had contacts with similar rash. Patient also has not had new exposures (soaps, lotions, laundry detergents, foods, medications, plants, insects or animals).  Rachel Gonzalez has a history of essential hypertension. Blood pressure markedly elevated on arrival. Patient says that she has been taking Lisinopril consistently. Furosemide was discontinued by cardiology several months ago. She endorses bilateral lower extremity edema. She denies chest pain, headache, dizziness, heart palpitations, or syncope.  Past Medical History:  Diagnosis Date  . Anemia   . Diabetes mellitus   . Hypertension   . Renal disorder    Social History   Socioeconomic History  . Marital status: Married    Spouse name: Not on file  . Number of children: Not on file  . Years of education: Not on file  . Highest education level: Not on file  Occupational History  . Not on file  Social Needs  . Financial resource strain: Not on file  . Food insecurity:    Worry: Not on file    Inability: Not on file  . Transportation needs:    Medical: Not on file    Non-medical: Not on file  Tobacco Use  . Smoking status: Never Smoker  . Smokeless tobacco: Never Used  Substance and Sexual Activity  . Alcohol use: No  . Drug use: No  . Sexual activity: Not on file  Lifestyle  . Physical activity:    Days per week: Not on file    Minutes per session: Not on file  .  Stress: Not on file  Relationships  . Social connections:    Talks on phone: Not on file    Gets together: Not on file    Attends religious service: Not on file    Active member of club or organization: Not on file    Attends meetings of clubs or organizations: Not on file    Relationship status: Not on file  . Intimate partner violence:    Fear of current or ex partner: Not on file    Emotionally abused: Not on file    Physically abused: Not on file    Forced sexual activity: Not on file  Other Topics Concern  . Not on file  Social History Narrative   From [place].   Married.   Three children, 9 grandchildren.   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   [place] [address]   Immunization History  Administered Date(s) Administered  . Pneumococcal Polysaccharide-23 02/12/2015  . Td 05/07/2014  . Tdap 11/21/2007   Review of Systems  Constitutional: Negative.   Eyes: Negative.   Respiratory: Negative.   Cardiovascular: Negative.   Gastrointestinal: Negative.   Genitourinary: Negative.   Musculoskeletal: Positive for joint pain (right shoulder).  Skin: Positive for rash. Negative for itching.  Neurological: Negative.   Endo/Heme/Allergies: Negative.   Psychiatric/Behavioral: Negative.      Objective:  Physical Exam  Constitutional:  She appears well-developed and well-nourished.  HENT:  Head: Normocephalic and atraumatic.  Eyes: Pupils are equal, round, and reactive to light.  Neck: Normal range of motion. Neck supple.  Cardiovascular: Normal rate, regular rhythm, normal heart sounds and intact distal pulses.  2 + bilateral lower extremity edema  Pulmonary/Chest: Effort normal and breath sounds normal.  Abdominal: Soft. Bowel sounds are normal.  Skin: Skin is warm and dry. Rash noted.     Erythematous, moisture under left breast fold, wide spread.   Psychiatric: She has a normal mood and affect. Her behavior  is normal. Judgment and thought content normal.      Assessment:  BP (!) 176/80 (BP Location: Left Arm, Patient Position: Sitting, Cuff Size: Large)   Pulse 87   Temp 98.3 F (36.8 C) (Oral)   Resp 16   Ht 5\' 4"  (1.626 m)   Wt 229 lb (103.9 kg)   SpO2 100%   BMI 39.31 kg/m   Plan:  1. Essential hypertension Blood pressure markedly elevated on arrival. Patient says that she was "chased by birds" in her yard prior to arrival.  Administered Clonidine 0.1 mg. BP improved to 156/78 after 15 minutes.  Patient discontinued furosemide several weeks ago, she continues to experience bilateral lower extremity edema.  Will restart furosemide 20 mg and continue Lisinopril at current dosage.  Most recent GFR is 43 and creatinine 1.43, which is consistent with CKD 3.  No renal adjustments warranted on current medication regimen.  - cloNIDine (CATAPRES) tablet 0.1 mg - furosemide (LASIX) 20 MG tablet; Take 1 tablet (20 mg total) by mouth daily.  Dispense: 30 tablet; Refill: 5 - Basic Metabolic Panel; Future  2. Intertrigo Continue to apply clotrimazole to skin folds of left breast BID. Leave area open to air at home.   3. Uncontrolled type 2 diabetes mellitus with hyperglycemia (HCC) Most recent hemoglobin a1C was 9.7, which was improved from 11.3. No medication changes warranted.  Will recheck hemoglobin a1c in 2 months Discussed the importance of taking medications consistently in order to achieve positive outcomes.  - gabapentin (NEURONTIN) 300 MG capsule; Take 1 capsule (300 mg total) by mouth 2 (two) times daily.  Dispense: 180 capsule; Refill: 3   RTC: Blood pressure check in 1 week   Nolon Nations  MSN, FNP-C Patient Care [facility] [address] [place], [name] [address] [phone]

## 2017-09-06 ENCOUNTER — Encounter: Payer: Self-pay | Admitting: Family Medicine

## 2017-09-15 ENCOUNTER — Other Ambulatory Visit: Payer: Self-pay | Admitting: Family Medicine

## 2017-09-15 ENCOUNTER — Other Ambulatory Visit: Payer: Medicare Other

## 2017-09-15 DIAGNOSIS — I10 Essential (primary) hypertension: Secondary | ICD-10-CM | POA: Diagnosis not present

## 2017-09-15 DIAGNOSIS — G8929 Other chronic pain: Secondary | ICD-10-CM

## 2017-09-15 DIAGNOSIS — M25511 Pain in right shoulder: Principal | ICD-10-CM

## 2017-09-16 LAB — BASIC METABOLIC PANEL
BUN/Creatinine Ratio: 18 (ref 12–28)
BUN: 30 mg/dL — AB (ref 8–27)
CALCIUM: 9.4 mg/dL (ref 8.7–10.3)
CHLORIDE: 103 mmol/L (ref 96–106)
CO2: 27 mmol/L (ref 20–29)
Creatinine, Ser: 1.65 mg/dL — ABNORMAL HIGH (ref 0.57–1.00)
GFR calc Af Amer: 37 mL/min/{1.73_m2} — ABNORMAL LOW (ref >59)
GFR, EST NON AFRICAN AMERICAN: 32 mL/min/{1.73_m2} — AB (ref >59)
Glucose: 185 mg/dL — ABNORMAL HIGH (ref 65–99)
Potassium: 5 mmol/L (ref 3.5–5.2)
Sodium: 141 mmol/L (ref 134–144)

## 2017-10-06 ENCOUNTER — Telehealth: Payer: Self-pay

## 2017-10-06 ENCOUNTER — Other Ambulatory Visit: Payer: Self-pay | Admitting: Family Medicine

## 2017-10-06 DIAGNOSIS — M25511 Pain in right shoulder: Principal | ICD-10-CM

## 2017-10-06 DIAGNOSIS — G8929 Other chronic pain: Secondary | ICD-10-CM

## 2017-10-06 MED ORDER — OMEPRAZOLE 20 MG PO CPDR: 20.0000 mg | DELAYED_RELEASE_CAPSULE | Freq: Every day | ORAL | 0 refills | Status: DC

## 2017-10-06 NOTE — Telephone Encounter (Signed)
Omeprazole has been refilled. Please advise on Tylenol #3. Thanks!

## 2017-10-07 NOTE — Telephone Encounter (Signed)
Debby BudAndre, please advise.

## 2017-10-18 ENCOUNTER — Ambulatory Visit (INDEPENDENT_AMBULATORY_CARE_PROVIDER_SITE_OTHER): Payer: Medicare Other | Admitting: Family Medicine

## 2017-10-18 ENCOUNTER — Encounter: Payer: Self-pay | Admitting: Family Medicine

## 2017-10-18 ENCOUNTER — Other Ambulatory Visit: Payer: Self-pay | Admitting: Family Medicine

## 2017-10-18 VITALS — BP 160/72 | HR 80 | Temp 97.9°F | Resp 16 | Ht 64.0 in | Wt 224.0 lb

## 2017-10-18 DIAGNOSIS — E119 Type 2 diabetes mellitus without complications: Secondary | ICD-10-CM | POA: Diagnosis not present

## 2017-10-18 DIAGNOSIS — E569 Vitamin deficiency, unspecified: Secondary | ICD-10-CM

## 2017-10-18 DIAGNOSIS — E1140 Type 2 diabetes mellitus with diabetic neuropathy, unspecified: Secondary | ICD-10-CM

## 2017-10-18 DIAGNOSIS — G8929 Other chronic pain: Secondary | ICD-10-CM

## 2017-10-18 DIAGNOSIS — M25511 Pain in right shoulder: Secondary | ICD-10-CM | POA: Diagnosis not present

## 2017-10-18 DIAGNOSIS — Z1231 Encounter for screening mammogram for malignant neoplasm of breast: Secondary | ICD-10-CM | POA: Diagnosis not present

## 2017-10-18 DIAGNOSIS — E1165 Type 2 diabetes mellitus with hyperglycemia: Secondary | ICD-10-CM | POA: Diagnosis not present

## 2017-10-18 DIAGNOSIS — I10 Essential (primary) hypertension: Secondary | ICD-10-CM | POA: Diagnosis not present

## 2017-10-18 DIAGNOSIS — Z794 Long term (current) use of insulin: Secondary | ICD-10-CM

## 2017-10-18 DIAGNOSIS — IMO0001 Reserved for inherently not codable concepts without codable children: Secondary | ICD-10-CM

## 2017-10-18 DIAGNOSIS — Z13820 Encounter for screening for osteoporosis: Secondary | ICD-10-CM | POA: Diagnosis not present

## 2017-10-18 DIAGNOSIS — Z9189 Other specified personal risk factors, not elsewhere classified: Secondary | ICD-10-CM | POA: Diagnosis not present

## 2017-10-18 DIAGNOSIS — Z1239 Encounter for other screening for malignant neoplasm of breast: Secondary | ICD-10-CM

## 2017-10-18 LAB — POCT URINALYSIS DIPSTICK
Bilirubin, UA: NEGATIVE
Glucose, UA: NEGATIVE
Ketones, UA: NEGATIVE
Leukocytes, UA: NEGATIVE
Nitrite, UA: NEGATIVE
Protein, UA: POSITIVE — AB
Spec Grav, UA: 1.015 (ref 1.010–1.025)
Urobilinogen, UA: 0.2 E.U./dL
pH, UA: 5.5 (ref 5.0–8.0)

## 2017-10-18 LAB — POCT GLYCOSYLATED HEMOGLOBIN (HGB A1C): Hemoglobin A1C: 10.3 % — AB (ref 4.0–5.6)

## 2017-10-18 LAB — GLUCOSE, POCT (MANUAL RESULT ENTRY): POC Glucose: 210 mg/dl — AB (ref 70–99)

## 2017-10-18 MED ORDER — INSULIN GLARGINE 100 UNIT/ML SOLOSTAR PEN: PEN_INJECTOR | SUBCUTANEOUS | 3 refills | Status: DC

## 2017-10-18 MED ORDER — INSULIN ASPART 100 UNIT/ML FLEXPEN: PEN_INJECTOR | SUBCUTANEOUS | 3 refills | Status: DC

## 2017-10-18 NOTE — Patient Instructions (Signed)
Breakfast:  "Break your fast" 2 slices of Malaw[place]urkey bacon 1 boiled egg 2 six ounce glasses of water Coffee/tea (Splenda/stevia) Snack:  High protein (10 almonds, cottage cheese, or greek yogurt)   Lunch:  Small garden salad (oil & vinegar)  can of tuna (or chicken breast, lamb chop, salmon, etc)   Snack:  Almonds ( or protein shake)   Dinner:  eBaySensible dinner Lean meat, vegetable, and complex carbohydrate (brown rice, kenoa, sweet potato) The 2018 Physical Activity Guidelines recommend the equivalent of 150 minutes per week of moderate to vigorous aerobic activity each week, with muscle-strengthening activities on two days during the week    PRESCRIPTION FOR EXERCISE  Frequency Four to five days per week  Intensity Moderate- During moderate intensity exercise, a person is too winded to sing but is not so winded they cannot talk  Time 30 minutes or a total of 150 minutes per week.   Type Brisk walking PLUS One set each of: 0 Body weight squats  10 0 Plank hold for 30 seconds   Basic bodyweight exercise guide: Squat and plank  (A) Body weight squat: 0 The squat develops strength in the legs and torso. Stand with your feet about shoulder's width apart and slightly externally rotated. Maintain your weight on your heels or midfoot throughout the exercise. Keep your lower back flat; do not allow it to round or to extend excessively. Your knees should remain aligned with your ankles and legs throughout the motion (knees caving inward towards one another is a common problem). Descend until your hips come just below the level of your knees. If strength or mobility limitations prevent you from reaching this depth, begin with partial squats and gradually increase the depth over time. Once you can perform 15 squats with proper technique and full depth, make the exercise more challenging by holding a weight. (B) Standard plank: 0 The basic plank develops torso (core) strength. Maintain a  straight torso, not allowing your lower back to sag or your hips to elevate, throughout the exercise. Gradually increase the time you can hold the position.

## 2017-10-18 NOTE — Progress Notes (Signed)
Patient [facility]  Provider: Lanae Boast, FNP   Hypertension Follow Up Visit  SUBJECTIVE:  Rachel Gonzalez is a 67 y.o. female with hypertension and diabetes. A1c today is 10.3. This is an increase since the last visit. Patient states that she checks her FBS and they are 150s. Patient reports taking 6 units of novolog prior to meals. Takes 35 units of Lantus each night. Next eye exam 10/26/2017.  24 hour diet recall: rice, baked chicken, cabbage, cornbread, Whopper Jr, fries and Sprite. Today: raisin bran and a fruit cup.     Current Outpatient Medications  Medication Sig Dispense Refill  . acetaminophen-codeine (TYLENOL #3) 300-30 MG tablet TAKE 1 TABLET BY MOUTH EVERY 6 HOURS AS NEEDED FOR MODERATE PAIN 30 tablet 0  . Blood Glucose Monitoring Suppl (ACCU-CHEK AVIVA PLUS) w/Device KIT 1 Device by Does not apply route once. Use as directed to check blood sugar 1 kit 0  . Blood Glucose Monitoring Suppl (ONE TOUCH ULTRA SYSTEM KIT) w/Device KIT Test three times daily. Dx Type 2 Diabetes. 1 each 0  . clotrimazole (CLOTRIMAZOLE ANTI-FUNGAL) 1 % cream Apply 1 application topically 2 (two) times daily. 30 g 0  . Continuous Blood Gluc Receiver (FREESTYLE LIBRE 14 DAY READER) DEVI 1 each by Does not apply route 4 (four) times daily -  before meals and at bedtime. 1 Device 0  . docusate sodium (COLACE) 100 MG capsule Take 1 capsule (100 mg total) by mouth 2 (two) times daily. (Patient taking differently: Take 100 mg by mouth daily as needed for mild constipation. ) 10 capsule 0  . furosemide (LASIX) 20 MG tablet Take 1 tablet (20 mg total) by mouth daily. 30 tablet 5  . gabapentin (NEURONTIN) 300 MG capsule Take 1 capsule (300 mg total) by mouth 2 (two) times daily. 180 capsule 3  . glucose blood (ACCU-CHEK AVIVA) test strip Use to check blood sugar 3 times daily and at bedtime. 100 each 12  . insulin aspart (NOVOLOG FLEXPEN) 100 UNIT/ML FlexPen INJECT  6 UNITS INTO THE SKIN THREE TIMES DAILY WITH MEALS 3 pen 3  . Lancet Devices (ACCU-CHEK SOFTCLIX) lancets Use as instructed 100 each 12  . LANTUS SOLOSTAR 100 UNIT/ML Solostar Pen INJECT 35 UNITS SUBCUTANEOUSLY ONCE DAILY AT  10PM 15 mL 3  . levocetirizine (XYZAL) 5 MG tablet TAKE ONE TABLET BY MOUTH ONCE DAILY IN THE EVENING (Patient taking differently: Take 5 mg by mouth every evening. ) 30 tablet 11  . lisinopril (PRINIVIL,ZESTRIL) 20 MG tablet Take 1 tablet (20 mg total) by mouth daily. 90 tablet 1  . nystatin (NYSTATIN) powder Apply topically 4 (four) times daily. (Patient taking differently: Apply 1 g topically 4 (four) times daily. ) 15 g 5  . omeprazole (PRILOSEC) 20 MG capsule Take 1 capsule (20 mg total) by mouth daily. 30 capsule 0  . ONE TOUCH LANCETS MISC Test 3 times daily with glucometer. Dx Type 2 diabetes with unspecified complications. 200 each 5  . RELION PEN NEEDLE 31G/8MM 31G X 8 MM MISC USE FOUR TIMES DAILY BEFORE MEALS AND AT BEDTIME 100 each 11  . sucralfate (CARAFATE) 1 g tablet Take 1 tablet (1 g total) by mouth 4 (four) times daily -  with meals and at bedtime. 120 tablet 0   No current facility-administered medications for this visit.     Recent Results (from the past 2160 hour(s))  Glucose (CBG)     Status: Abnormal  Collection Time: 07/30/17 11:38 AM  Result Value Ref Range   POC Glucose 131 (A) 70 - 99 mg/dl  HgB A1c     Status: Abnormal   Collection Time: 07/30/17 11:41 AM  Result Value Ref Range   Hemoglobin A1C 9.7 (A) 4.0 - 5.6 %   HbA1c, POC (prediabetic range)  5.7 - 6.4 %   HbA1c, POC (controlled diabetic range)  0.0 - 7.0 %  Urinalysis Dipstick     Status: Abnormal   Collection Time: 07/30/17 12:19 PM  Result Value Ref Range   Color, UA     Clarity, UA     Glucose, UA Negative Negative   Bilirubin, UA neg    Ketones, UA neg    Spec Grav, UA 1.015 1.010 - 1.025   Blood, UA small    pH, UA 5.5 5.0 - 8.0   Protein, UA Positive (A) Negative     Comment: 100   Urobilinogen, UA 0.2 0.2 or 1.0 E.U./dL   Nitrite, UA neg    Leukocytes, UA Negative Negative   Appearance     Odor    Arthritis Panel     Status: Abnormal   Collection Time: 07/30/17  1:12 PM  Result Value Ref Range   Uric Acid 5.5 2.5 - 7.1 mg/dL    Comment:            Therapeutic target for gout patients: <6.0   Anti Nuclear Antibody(ANA) Negative Negative   Rhuematoid fact SerPl-aCnc <10.0 0.0 - 13.9 IU/mL   Sed Rate 43 (H) 0 - 40 mm/hr  I-stat troponin, ED     Status: None   Collection Time: 08/14/17  2:11 PM  Result Value Ref Range   Troponin i, poc 0.01 0.00 - 0.08 ng/mL   Comment 3            Comment: Due to the release kinetics of cTnI, a negative result within the first hours of the onset of symptoms does not rule out myocardial infarction with certainty. If myocardial infarction is still suspected, repeat the test at appropriate intervals.   Basic metabolic panel     Status: Abnormal   Collection Time: 08/14/17  2:17 PM  Result Value Ref Range   Sodium 140 135 - 145 mmol/L   Potassium 5.1 3.5 - 5.1 mmol/L   Chloride 105 101 - 111 mmol/L   CO2 30 22 - 32 mmol/L   Glucose, Bld 211 (H) 65 - 99 mg/dL   BUN 18 6 - 20 mg/dL   Creatinine, Ser 1.43 (H) 0.44 - 1.00 mg/dL   Calcium 8.8 (L) 8.9 - 10.3 mg/dL   GFR calc non Af Amer 38 (L) >60 mL/min   GFR calc Af Amer 43 (L) >60 mL/min    Comment: (NOTE) The eGFR has been calculated using the CKD EPI equation. This calculation has not been validated in all clinical situations. eGFR's persistently <60 mL/min signify possible Chronic Kidney Disease.    Anion gap 5 5 - 15    Comment: Performed at [facility] [address], [place], [place] [address]  CBC     Status: Abnormal   Collection Time: 08/14/17  2:17 PM  Result Value Ref Range   WBC 5.9 4.0 - 10.5 K/uL   RBC 3.71 (L) 3.87 - 5.11 MIL/uL   Hemoglobin 9.9 (L) 12.0 - 15.0 g/dL   HCT 32.7 (L) 36.0 - 46.0 %   MCV 88.1 78.0 - 100.0 fL   MCH  26.7 26.0 - 34.0 pg   MCHC 30.3 30.0 - 36.0 g/dL   RDW 13.1 11.5 - 15.5 %   Platelets 243 150 - 400 K/uL    Comment: Performed at [facility], [place] [address], [place], [place] [address]  Troponin I     Status: None   Collection Time: 08/14/17  4:48 PM  Result Value Ref Range   Troponin I <0.03 <0.03 ng/mL    Comment: Performed at [facility] [address], [place], [place] [address]  Basic Metabolic Panel     Status: Abnormal   Collection Time: 09/15/17  9:57 AM  Result Value Ref Range   Glucose 185 (H) 65 - 99 mg/dL   BUN 30 (H) 8 - 27 mg/dL   Creatinine, Ser 1.65 (H) 0.57 - 1.00 mg/dL   GFR calc non Af Amer 32 (L) >59 mL/min/1.73   GFR calc Af Amer 37 (L) >59 mL/min/1.73   BUN/Creatinine Ratio 18 12 - 28   Sodium 141 134 - 144 mmol/L   Potassium 5.0 3.5 - 5.2 mmol/L   Chloride 103 96 - 106 mmol/L   CO2 27 20 - 29 mmol/L   Calcium 9.4 8.7 - 10.3 mg/dL  Glucose (CBG)     Status: Abnormal   Collection Time: 10/18/17 10:46 AM  Result Value Ref Range   POC Glucose 210 (A) 70 - 99 mg/dl  HgB A1c     Status: Abnormal   Collection Time: 10/18/17 10:51 AM  Result Value Ref Range   Hemoglobin A1C 10.3 (A) 4.0 - 5.6 %   HbA1c POC (<> result, manual entry)     HbA1c, POC (prediabetic range)     HbA1c, POC (controlled diabetic range)      Hypertension ROS: Review of Systems  Constitutional: Negative.   HENT: Negative.   Eyes: Negative.   Respiratory: Negative.   Cardiovascular: Negative.   Gastrointestinal: Negative.   Genitourinary: Negative.   Musculoskeletal: Negative.   Skin: Negative.   Neurological: Negative.   Psychiatric/Behavioral: Negative.      OBJECTIVE:   BP (!) 160/72 (BP Location: Left Arm, Patient Position: Sitting, Cuff Size: Normal) Comment: manually  Pulse 80   Temp 97.9 F (36.6 C) (Oral)   Resp 16   Ht _0  (1.626 m)   Wt 224 lb (101.6 kg)   SpO2 100%   BMI 38.45 kg/m   Physical Exam  Constitutional: She is oriented to person,  place, and time. She appears well-developed and well-nourished. No distress.  HENT:  Head: Normocephalic and atraumatic.  Eyes: Pupils are equal, round, and reactive to light. Conjunctivae are normal.  Neck: Neck supple.  Cardiovascular: Normal rate, regular rhythm, normal heart sounds and intact distal pulses.  No murmur heard. Pulmonary/Chest: Effort normal and breath sounds normal. No respiratory distress. She has no wheezes. She exhibits no tenderness.  Abdominal: Soft. Bowel sounds are normal. She exhibits no distension and no mass. There is no tenderness.  Musculoskeletal: Normal range of motion.  Neurological: She is alert and oriented to person, place, and time.  Skin: Skin is warm and dry.  Psychiatric: She has a normal mood and affect. Her behavior is normal. Judgment and thought content normal.  Nursing note and vitals reviewed.    ASSESSMENT/PLAN:   1. Essential hypertension - Urinalysis Dipstick  2. Type 2 diabetes mellitus with diabetic neuropathy, with long-term current use of insulin (HCC) - Urinalysis Dipstick - HgB A1c - Glucose (CBG)  3.  Insulin dependent diabetes mellitus ([place]) We discussed diet and exercise. Identified carbohydrates and foods to be avoided. Patient visibly upset and tearful in the encounter.   The patient is asked to make an attempt to improve diet and exercise patterns to aid in medical management of this problem.  Return to care as scheduled and prn. Patient verbalized understanding and agreed with plan of care.    Ms. Doug Sou. Nathaneil Canary, FNP-BC [facility] [address] [place], [place] [address] [phone]

## 2017-10-19 LAB — BASIC METABOLIC PANEL
BUN/Creatinine Ratio: 16 (ref 12–28)
BUN: 25 mg/dL (ref 8–27)
CO2: 25 mmol/L (ref 20–29)
Calcium: 9.4 mg/dL (ref 8.7–10.3)
Chloride: 106 mmol/L (ref 96–106)
Creatinine, Ser: 1.53 mg/dL — ABNORMAL HIGH (ref 0.57–1.00)
GFR calc Af Amer: 41 mL/min/{1.73_m2} — ABNORMAL LOW (ref >59)
GFR calc non Af Amer: 35 mL/min/{1.73_m2} — ABNORMAL LOW (ref >59)
Glucose: 135 mg/dL — ABNORMAL HIGH (ref 65–99)
Potassium: 4.9 mmol/L (ref 3.5–5.2)
Sodium: 142 mmol/L (ref 134–144)

## 2017-10-26 DIAGNOSIS — Z961 Presence of intraocular lens: Secondary | ICD-10-CM | POA: Diagnosis not present

## 2017-10-26 DIAGNOSIS — H35342 Macular cyst, hole, or pseudohole, left eye: Secondary | ICD-10-CM | POA: Diagnosis not present

## 2017-10-26 DIAGNOSIS — E113593 Type 2 diabetes mellitus with proliferative diabetic retinopathy without macular edema, bilateral: Secondary | ICD-10-CM | POA: Diagnosis not present

## 2017-10-27 DIAGNOSIS — E113521 Type 2 diabetes mellitus with proliferative diabetic retinopathy with traction retinal detachment involving the macula, right eye: Secondary | ICD-10-CM | POA: Diagnosis not present

## 2017-10-27 DIAGNOSIS — H3582 Retinal ischemia: Secondary | ICD-10-CM | POA: Diagnosis not present

## 2017-10-27 DIAGNOSIS — Z961 Presence of intraocular lens: Secondary | ICD-10-CM | POA: Diagnosis not present

## 2017-10-27 DIAGNOSIS — H3563 Retinal hemorrhage, bilateral: Secondary | ICD-10-CM | POA: Diagnosis not present

## 2017-10-27 DIAGNOSIS — E113522 Type 2 diabetes mellitus with proliferative diabetic retinopathy with traction retinal detachment involving the macula, left eye: Secondary | ICD-10-CM | POA: Diagnosis not present

## 2017-10-27 DIAGNOSIS — H35373 Puckering of macula, bilateral: Secondary | ICD-10-CM | POA: Diagnosis not present

## 2017-11-02 ENCOUNTER — Other Ambulatory Visit: Payer: Self-pay | Admitting: Family Medicine

## 2017-11-02 DIAGNOSIS — Z794 Long term (current) use of insulin: Secondary | ICD-10-CM

## 2017-11-02 DIAGNOSIS — I10 Essential (primary) hypertension: Secondary | ICD-10-CM

## 2017-11-02 DIAGNOSIS — E119 Type 2 diabetes mellitus without complications: Secondary | ICD-10-CM

## 2017-11-02 DIAGNOSIS — IMO0001 Reserved for inherently not codable concepts without codable children: Secondary | ICD-10-CM

## 2017-11-15 DIAGNOSIS — E113512 Type 2 diabetes mellitus with proliferative diabetic retinopathy with macular edema, left eye: Secondary | ICD-10-CM | POA: Diagnosis not present

## 2017-11-30 ENCOUNTER — Telehealth: Payer: Self-pay

## 2017-11-30 DIAGNOSIS — I10 Essential (primary) hypertension: Secondary | ICD-10-CM

## 2017-11-30 DIAGNOSIS — M25511 Pain in right shoulder: Secondary | ICD-10-CM

## 2017-11-30 DIAGNOSIS — G8929 Other chronic pain: Secondary | ICD-10-CM

## 2017-11-30 NOTE — Telephone Encounter (Signed)
Patient called for refill on her acid reflux medication, blood pressure medication, and tylenol number 3.  Thanks

## 2017-12-01 MED ORDER — OMEPRAZOLE 20 MG PO CPDR: 20.0000 mg | DELAYED_RELEASE_CAPSULE | Freq: Every day | ORAL | 2 refills | Status: DC

## 2017-12-01 MED ORDER — FUROSEMIDE 20 MG PO TABS: 20.0000 mg | ORAL_TABLET | Freq: Every day | ORAL | 5 refills | Status: DC

## 2017-12-01 MED ORDER — ACETAMINOPHEN-CODEINE #3 300-30 MG PO TABS: ORAL_TABLET | ORAL | 0 refills | Status: DC

## 2017-12-01 NOTE — Telephone Encounter (Signed)
Refilled

## 2017-12-06 DIAGNOSIS — E113511 Type 2 diabetes mellitus with proliferative diabetic retinopathy with macular edema, right eye: Secondary | ICD-10-CM | POA: Diagnosis not present

## 2017-12-18 ENCOUNTER — Other Ambulatory Visit: Payer: Self-pay | Admitting: Family Medicine

## 2017-12-18 DIAGNOSIS — E118 Type 2 diabetes mellitus with unspecified complications: Principal | ICD-10-CM

## 2017-12-18 DIAGNOSIS — E1165 Type 2 diabetes mellitus with hyperglycemia: Secondary | ICD-10-CM

## 2017-12-18 DIAGNOSIS — IMO0002 Reserved for concepts with insufficient information to code with codable children: Secondary | ICD-10-CM

## 2017-12-18 DIAGNOSIS — Z794 Long term (current) use of insulin: Principal | ICD-10-CM

## 2018-01-04 ENCOUNTER — Ambulatory Visit: Payer: Medicare Other | Admitting: Orthotics

## 2018-01-04 ENCOUNTER — Ambulatory Visit (INDEPENDENT_AMBULATORY_CARE_PROVIDER_SITE_OTHER): Payer: Medicare Other | Admitting: Podiatry

## 2018-01-04 ENCOUNTER — Encounter: Payer: Self-pay | Admitting: Podiatry

## 2018-01-04 ENCOUNTER — Ambulatory Visit (INDEPENDENT_AMBULATORY_CARE_PROVIDER_SITE_OTHER): Payer: Medicare Other

## 2018-01-04 VITALS — BP 182/92 | HR 81 | Resp 16

## 2018-01-04 DIAGNOSIS — B351 Tinea unguium: Secondary | ICD-10-CM

## 2018-01-04 DIAGNOSIS — M79676 Pain in unspecified toe(s): Secondary | ICD-10-CM

## 2018-01-04 DIAGNOSIS — E1142 Type 2 diabetes mellitus with diabetic polyneuropathy: Secondary | ICD-10-CM

## 2018-01-04 DIAGNOSIS — R296 Repeated falls: Secondary | ICD-10-CM

## 2018-01-04 NOTE — Progress Notes (Signed)
Being seen by NP.Marland Kitchenwill get appointment with MD.

## 2018-01-04 NOTE — Progress Notes (Signed)
Subjective:  Patient ID: Rachel Gonzalez, female    DOB: 02-07-1951,  MRN: [id number] HPI Chief Complaint  Patient presents with  . Diabetes    Diabetic Foot Exam - requesting nail trim and diabetic shoes - last a1c was 10.3  . New Patient (Initial Visit)    67 y.o. female presents with the above complaint.   ROS: Denies fever chills nausea vomiting muscle aches pains calf pain back pain chest pain shortness of breath.  Past Medical History:  Diagnosis Date  . Anemia   . Diabetes mellitus   . Hypertension   . Renal disorder    Past Surgical History:  Procedure Laterality Date  . AMPUTATION Right 05/08/2014   Procedure: AMPUTATION RAY, right great toe;  Surgeon: Newt Minion, MD;  Location: [place];  Service: Orthopedics;  Laterality: Right;  . arm surgery     . CESAREAN SECTION    . I&D EXTREMITY Right 05/08/2014   Procedure: IRRIGATION AND DEBRIDEMENT FOOT;  Surgeon: Newt Minion, MD;  Location: [place];  Service: Orthopedics;  Laterality: Right;    Current Outpatient Medications:  .  acetaminophen-codeine (TYLENOL #3) 300-30 MG tablet, TAKE 1 TABLET BY MOUTH EVERY 6 HOURS AS NEEDED FOR MODERATE PAIN, Disp: 30 tablet, Rfl: 0 .  Blood Glucose Monitoring Suppl (ACCU-CHEK AVIVA PLUS) w/Device KIT, 1 Device by Does not apply route once. Use as directed to check blood sugar, Disp: 1 kit, Rfl: 0 .  Blood Glucose Monitoring Suppl (ONE TOUCH ULTRA SYSTEM KIT) w/Device KIT, Test three times daily. Dx Type 2 Diabetes., Disp: 1 each, Rfl: 0 .  clotrimazole (CLOTRIMAZOLE ANTI-FUNGAL) 1 % cream, Apply 1 application topically 2 (two) times daily., Disp: 30 g, Rfl: 0 .  Continuous Blood Gluc Receiver (FREESTYLE LIBRE 14 DAY READER) DEVI, 1 each by Does not apply route 4 (four) times daily -  before meals and at bedtime., Disp: 1 Device, Rfl: 0 .  docusate sodium (COLACE) 100 MG capsule, Take 1 capsule (100 mg total) by mouth 2 (two) times daily. (Patient taking differently: Take 100 mg by mouth  daily as needed for mild constipation. ), Disp: 10 capsule, Rfl: 0 .  furosemide (LASIX) 20 MG tablet, Take 1 tablet (20 mg total) by mouth daily., Disp: 30 tablet, Rfl: 5 .  gabapentin (NEURONTIN) 300 MG capsule, Take 1 capsule (300 mg total) by mouth 2 (two) times daily., Disp: 180 capsule, Rfl: 3 .  glucose blood (ACCU-CHEK AVIVA) test strip, Use to check blood sugar 3 times daily and at bedtime., Disp: 100 each, Rfl: 12 .  insulin aspart (NOVOLOG FLEXPEN) 100 UNIT/ML FlexPen, INJECT 6 UNITS INTO THE SKIN THREE TIMES DAILY WITH MEALS, Disp: 3 pen, Rfl: 3 .  Insulin Glargine (LANTUS SOLOSTAR) 100 UNIT/ML Solostar Pen, INJECT 35 UNITS SUBCUTANEOUSLY ONCE DAILY AT  10PM, Disp: 15 mL, Rfl: 3 .  Lancet Devices (ACCU-CHEK SOFTCLIX) lancets, Use as instructed, Disp: 100 each, Rfl: 12 .  levocetirizine (XYZAL) 5 MG tablet, TAKE ONE TABLET BY MOUTH ONCE DAILY IN THE EVENING (Patient taking differently: Take 5 mg by mouth every evening. ), Disp: 30 tablet, Rfl: 11 .  lisinopril (PRINIVIL,ZESTRIL) 20 MG tablet, TAKE 1 TABLET BY MOUTH ONCE DAILY, Disp: 90 tablet, Rfl: 1 .  nystatin (NYSTATIN) powder, Apply topically 4 (four) times daily. (Patient taking differently: Apply 1 g topically 4 (four) times daily. ), Disp: 15 g, Rfl: 5 .  omeprazole (PRILOSEC) 20 MG capsule, Take 1 capsule (20 mg total) by  mouth daily., Disp: 30 capsule, Rfl: 2 .  ONE TOUCH LANCETS MISC, Test 3 times daily with glucometer. Dx Type 2 diabetes with unspecified complications., Disp: 200 each, Rfl: 5 .  RELION PEN NEEDLE 31G/8MM 31G X 8 MM MISC, USE AS DIRECTED 4 TIMES DAILY, Disp: 100 each, Rfl: 11 .  sucralfate (CARAFATE) 1 g tablet, Take 1 tablet (1 g total) by mouth 4 (four) times daily -  with meals and at bedtime., Disp: 120 tablet, Rfl: 0  Allergies  Allergen Reactions  . Tramadol Nausea And Vomiting  . Adhesive [Tape] Itching   Review of Systems Objective:   Vitals:   01/04/18 1157  BP: (!) 182/92  Pulse: 81  Resp: 16     General: Well developed, nourished, in no acute distress, alert and oriented x3   Dermatological: Skin is warm, dry and supple bilateral. Nails x 10 are well maintained; remaining integument appears unremarkable at this time. There are no open sores, no preulcerative lesions, no rash or signs of infection present.  Toenails are long thick yellow dystrophic clinically mycotic.  Vascular: Dorsalis Pedis artery and Posterior Tibial artery pedal pulses are 2/4 bilateral with immedate capillary fill time. Pedal hair growth present. No varicosities and no lower extremity edema present bilateral.   Neruologic: Grossly intact via light touch bilateral. Vibratory intact via tuning fork bilateral. Protective threshold with Semmes Wienstein monofilament i diminished to all pedal sites bilateral. Patellar and Achilles deep tendon reflexes 2+ bilateral. No Babinski or clonus noted bilateral.   Musculoskeletal: No gross boney pedal deformities bilateral. No pain, crepitus, or limitation noted with foot and ankle range of motion bilateral. Muscular strength 5/5 in all groups tested bilateral.  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  Gait: Unassisted, Nonantalgic.    Radiographs:  Radiographs taken today demonstrate full ray amputation no signs of infection.  No acute findings.  Assessment & Plan:   Assessment: Diabetes with diabetic peripheral neuropathy hammertoe deformity history of osteomyelitis with amputation and pain in limb secondary to onychomycosis.  Plan: Debrided toenails 1 through 5 left and 2 through 5 right.  I would like to see by getting her diabetic shoes with toe filler.  She made an appointment to follow-up with Liliane Channel at 345 today.  Plan:      Tayjah Lobdell T. [place], [place]

## 2018-01-19 ENCOUNTER — Ambulatory Visit (INDEPENDENT_AMBULATORY_CARE_PROVIDER_SITE_OTHER): Payer: Medicare Other | Admitting: Family Medicine

## 2018-01-19 ENCOUNTER — Telehealth: Payer: Self-pay

## 2018-01-19 VITALS — BP 172/69 | HR 79 | Temp 98.1°F | Resp 14 | Ht 64.0 in | Wt 229.0 lb

## 2018-01-19 DIAGNOSIS — M25511 Pain in right shoulder: Secondary | ICD-10-CM

## 2018-01-19 DIAGNOSIS — N183 Chronic kidney disease, stage 3 unspecified: Secondary | ICD-10-CM

## 2018-01-19 DIAGNOSIS — I10 Essential (primary) hypertension: Secondary | ICD-10-CM | POA: Diagnosis not present

## 2018-01-19 DIAGNOSIS — K219 Gastro-esophageal reflux disease without esophagitis: Secondary | ICD-10-CM | POA: Diagnosis not present

## 2018-01-19 DIAGNOSIS — G8929 Other chronic pain: Secondary | ICD-10-CM

## 2018-01-19 DIAGNOSIS — J309 Allergic rhinitis, unspecified: Secondary | ICD-10-CM | POA: Diagnosis not present

## 2018-01-19 DIAGNOSIS — E1165 Type 2 diabetes mellitus with hyperglycemia: Secondary | ICD-10-CM

## 2018-01-19 LAB — POCT URINALYSIS DIPSTICK
Bilirubin, UA: NEGATIVE
Glucose, UA: NEGATIVE
Ketones, UA: NEGATIVE
Nitrite, UA: NEGATIVE
Protein, UA: POSITIVE — AB
Spec Grav, UA: 1.015 (ref 1.010–1.025)
Urobilinogen, UA: 0.2 E.U./dL
pH, UA: 6 (ref 5.0–8.0)

## 2018-01-19 LAB — GLUCOSE, RANDOM

## 2018-01-19 LAB — GLUCOSE, POCT (MANUAL RESULT ENTRY)
POC Glucose: 45 mg/dl — AB (ref 70–99)
POC Glucose: 68 mg/dl — AB (ref 70–99)

## 2018-01-19 LAB — POCT GLYCOSYLATED HEMOGLOBIN (HGB A1C): Hemoglobin A1C: 8.3 % — AB (ref 4.0–5.6)

## 2018-01-19 MED ORDER — OMEPRAZOLE 20 MG PO CPDR: 20.0000 mg | DELAYED_RELEASE_CAPSULE | Freq: Every day | ORAL | 2 refills | Status: DC

## 2018-01-19 MED ORDER — ACETAMINOPHEN-CODEINE #3 300-30 MG PO TABS: ORAL_TABLET | ORAL | 0 refills | Status: DC

## 2018-01-19 MED ORDER — LEVOCETIRIZINE DIHYDROCHLORIDE 5 MG PO TABS: ORAL_TABLET | ORAL | 11 refills | Status: DC

## 2018-01-19 MED ORDER — FUROSEMIDE 40 MG PO TABS: 40.0000 mg | ORAL_TABLET | Freq: Every day | ORAL | 3 refills | Status: DC

## 2018-01-19 MED ORDER — INSULIN GLARGINE 100 UNIT/ML SOLOSTAR PEN: PEN_INJECTOR | SUBCUTANEOUS | 3 refills | Status: DC

## 2018-01-19 NOTE — Progress Notes (Signed)
8

## 2018-01-19 NOTE — Telephone Encounter (Signed)
Can we do this? Please advise. 

## 2018-01-19 NOTE — Progress Notes (Addendum)
[facility]   Progress Note: General Provider: Mike Gip, FNP  SUBJECTIVE:   Rachel Gonzalez is a 67 y.o. female who  has a past medical history of Anemia, Diabetes mellitus, Hypertension, and Renal disorder.. Patient presents today for Hypertension and Diabetes  Patient states that she ate breakfast this morning and had 6 units of  novolog this AM. Presents today with a CBG of 45. Patient given glucose tablet, orange juice, graham crackers and peanut butter. CB after 20 minutes is 68. Patient states that she is feeling light headed today. Patient is not checking her FBS at home prior to taking  insulin.  Patient states that her blood "has been running low in the mornings" based on feeling light headed. Patient states that she does not have an appetite. States that she has hypoglycemic episodes at least 2 times  per week.  Paitent with CKD stage 3. Was referred to nephrology and  seen 2 times. Patient  has not gone back since 01/2017 despite being instructed to rtc in 3 months.No further follow ups scheduled.  Review of Systems  Constitutional: Negative.   HENT: Negative.   Eyes: Negative.   Respiratory: Negative.   Cardiovascular: Negative.   Gastrointestinal: Negative.   Genitourinary: Negative.   Musculoskeletal: Negative.   Skin: Negative.   Neurological: Negative.   Psychiatric/Behavioral: Negative.      OBJECTIVE: BP (!) 172/69 (BP Location: Left Arm, Patient Position: Sitting, Cuff Size: Large)   Pulse 79   Temp 98.1 F (36.7 C) (Oral)   Resp 14   Ht 5\' 4"  (1.626 m)   Wt 229 lb (103.9 kg)   SpO2 100%   BMI 39.31 kg/m   Physical Exam  Constitutional: She is oriented to person, place, and time. She appears well-developed and well-nourished. No distress.  HENT:  Head: Normocephalic and atraumatic.  Eyes: Pupils are equal, round, and reactive to light. Conjunctivae and EOM are normal.  Neck: Normal range of motion.    Cardiovascular: Normal rate, regular rhythm, normal heart sounds and intact distal pulses.  Pulmonary/Chest: Effort normal and breath sounds normal. No respiratory distress.  Abdominal: Soft. Bowel sounds are normal. She exhibits no distension.  Musculoskeletal: Normal range of motion.  Neurological: She is alert and oriented to person, place, and time.  Skin: Skin is warm and dry.  Psychiatric: She has a normal mood and affect. Her behavior is normal. Thought content normal.  Nursing note and vitals reviewed.   ASSESSMENT/PLAN:   1. Essential hypertension - furosemide (LASIX) 20 MG tablet; Take 1 tablet (20 mg total) by mouth daily.  Dispense: 30 tablet; Refill: 5  2. Uncontrolled type 2 diabetes mellitus with hyperglycemia (HCC) Stop novolog TID. Increase lantus to 45 units at supper. Small snack before bed time with high protein. Advised patient to check her glucose levels and bring them in to her next appt. Patient is requesting diabetic shoes. Has been seen by podiatry for this on 01/04/2018. - HgB A1c - Urinalysis Dipstick - Glucose (CBG)  3. Allergic rhinitis - levocetirizine (XYZAL) 5 MG tablet; TAKE ONE TABLET BY MOUTH ONCE DAILY IN THE EVENING  Dispense: 30 tablet; Refill: 11  4. Chronic right shoulder pain - acetaminophen-codeine (TYLENOL #3) 300-30 MG tablet; TAKE 1 TABLET BY MOUTH EVERY 6 HOURS AS NEEDED FOR MODERATE PAIN  Dispense: 30 tablet; Refill: 0        The patient was given clear instructions to go to ER or  return to medical center if symptoms do not improve, worsen or new problems develop. The patient verbalized understanding and agreed with plan of care.   [name], FNP-BC [facility] [address]  [place], [place] [id number] [phone]     This note has been created with Dragon speech recognition software and smart phrase technology. Any transcriptional errors are unintentional.

## 2018-01-19 NOTE — Patient Instructions (Addendum)
DASH Eating Plan DASH stands for "Dietary Approaches to Stop Hypertension." The DASH eating plan is a healthy eating plan that has been shown to reduce high blood pressure (hypertension). It may also reduce your risk for type 2 diabetes, heart disease, and stroke. The DASH eating plan may also help with weight loss. What are tips for following this plan? General guidelines  Avoid eating more than 2,300 mg (milligrams) of salt (sodium) a day. If you have hypertension, you may need to reduce your sodium intake to 1,500 mg a day.  Limit alcohol intake to no more than 1 drink a day for nonpregnant women and 2 drinks a day for men. One drink equals 12 oz of beer, 5 oz of wine, or 1 oz of hard liquor.  Work with your health care provider to maintain a healthy body weight or to lose weight. Ask what an ideal weight is for you.  Get at least 30 minutes of exercise that causes your heart to beat faster (aerobic exercise) most days of the week. Activities may include walking, swimming, or biking.  Work with your health care provider or diet and nutrition specialist (dietitian) to adjust your eating plan to your individual calorie needs. Reading food labels  Check food labels for the amount of sodium per serving. Choose foods with less than 5 percent of the Daily Value of sodium. Generally, foods with less than 300 mg of sodium per serving fit into this eating plan.  To find whole grains, look for the word "whole" as the first word in the ingredient list. Shopping  Buy products labeled as "low-sodium" or "no salt added."  Buy fresh foods. Avoid canned foods and premade or frozen meals. Cooking  Avoid adding salt when cooking. Use salt-free seasonings or herbs instead of table salt or sea salt. Check with your health care provider or pharmacist before using salt substitutes.  Do not fry foods. Cook foods using healthy methods such as baking, boiling, grilling, and broiling instead.  Cook with  heart-healthy oils, such as olive, canola, soybean, or sunflower oil. Meal planning   Eat a balanced diet that includes: ? 5 or more servings of fruits and vegetables each day. At each meal, try to fill half of your plate with fruits and vegetables. ? Up to 6-8 servings of whole grains each day. ? Less than 6 oz of lean meat, poultry, or fish each day. A 3-oz serving of meat is about the same size as a deck of cards. One egg equals 1 oz. ? 2 servings of low-fat dairy each day. ? A serving of nuts, seeds, or beans 5 times each week. ? Heart-healthy fats. Healthy fats called Omega-3 fatty acids are found in foods such as flaxseeds and coldwater fish, like sardines, salmon, and mackerel.  Limit how much you eat of the following: ? Canned or prepackaged foods. ? Food that is high in trans fat, such as fried foods. ? Food that is high in saturated fat, such as fatty meat. ? Sweets, desserts, sugary drinks, and other foods with added sugar. ? Full-fat dairy products.  Do not salt foods before eating.  Try to eat at least 2 vegetarian meals each week.  Eat more home-cooked food and less restaurant, buffet, and fast food.  When eating at a restaurant, ask that your food be prepared with less salt or no salt, if possible. What foods are recommended? The items listed may not be a complete list. Talk with your dietitian about what   dietary choices are best for you. Grains Whole-grain or whole-wheat bread. Whole-grain or whole-wheat pasta. Brown rice. Modena Morrow. Bulgur. Whole-grain and low-sodium cereals. Pita bread. Low-fat, low-sodium crackers. Whole-wheat flour tortillas. Vegetables Fresh or frozen vegetables (raw, steamed, roasted, or grilled). Low-sodium or reduced-sodium tomato and vegetable juice. Low-sodium or reduced-sodium tomato sauce and tomato paste. Low-sodium or reduced-sodium canned vegetables. Fruits All fresh, dried, or frozen fruit. Canned fruit in natural juice (without  added sugar). Meat and other protein foods Skinless chicken or [place]. Ground chicken or [place]. Pork with fat trimmed off. Fish and seafood. Egg whites. Dried beans, peas, or lentils. Unsalted nuts, nut butters, and seeds. Unsalted canned beans. Lean cuts of beef with fat trimmed off. Low-sodium, lean deli meat. Dairy Low-fat (1%) or fat-free (skim) milk. Fat-free, low-fat, or reduced-fat cheeses. Nonfat, low-sodium ricotta or cottage cheese. Low-fat or nonfat yogurt. Low-fat, low-sodium cheese. Fats and oils Soft margarine without trans fats. Vegetable oil. Low-fat, reduced-fat, or light mayonnaise and salad dressings (reduced-sodium). Canola, safflower, olive, soybean, and sunflower oils. Avocado. Seasoning and other foods Herbs. Spices. Seasoning mixes without salt. Unsalted popcorn and pretzels. Fat-free sweets. What foods are not recommended? The items listed may not be a complete list. Talk with your dietitian about what dietary choices are best for you. Grains Baked goods made with fat, such as croissants, muffins, or some breads. Dry pasta or rice meal packs. Vegetables Creamed or fried vegetables. Vegetables in a cheese sauce. Regular canned vegetables (not low-sodium or reduced-sodium). Regular canned tomato sauce and paste (not low-sodium or reduced-sodium). Regular tomato and vegetable juice (not low-sodium or reduced-sodium). Angie Fava. Olives. Fruits Canned fruit in a light or heavy syrup. Fried fruit. Fruit in cream or butter sauce. Meat and other protein foods Fatty cuts of meat. Ribs. Fried meat. Berniece Salines. Sausage. Bologna and other processed lunch meats. Salami. Fatback. Hotdogs. Bratwurst. Salted nuts and seeds. Canned beans with added salt. Canned or smoked fish. Whole eggs or egg yolks. Chicken or [place] with skin. Dairy Whole or 2% milk, cream, and half-and-half. Whole or full-fat cream cheese. Whole-fat or sweetened yogurt. Full-fat cheese. Nondairy creamers. Whipped toppings.  Processed cheese and cheese spreads. Fats and oils Butter. Stick margarine. Lard. Shortening. Ghee. Bacon fat. Tropical oils, such as coconut, palm kernel, or palm oil. Seasoning and other foods Salted popcorn and pretzels. Onion salt, garlic salt, seasoned salt, table salt, and sea salt. Worcestershire sauce. Tartar sauce. Barbecue sauce. Teriyaki sauce. Soy sauce, including reduced-sodium. Steak sauce. Canned and packaged gravies. Fish sauce. Oyster sauce. Cocktail sauce. Horseradish that you find on the shelf. Ketchup. Mustard. Meat flavorings and tenderizers. Bouillon cubes. Hot sauce and Tabasco sauce. Premade or packaged marinades. Premade or packaged taco seasonings. Relishes. Regular salad dressings. Where to find more information:  National Heart, Lung, and [place]: https://wilson-eaton.com/  American Heart Association: www.heart.org Summary  The DASH eating plan is a healthy eating plan that has been shown to reduce high blood pressure (hypertension). It may also reduce your risk for type 2 diabetes, heart disease, and stroke.  With the DASH eating plan, you should limit salt (sodium) intake to 2,300 mg a day. If you have hypertension, you may need to reduce your sodium intake to 1,500 mg a day.  When on the DASH eating plan, aim to eat more fresh fruits and vegetables, whole grains, lean proteins, low-fat dairy, and heart-healthy fats.  Work with your health care provider or diet and nutrition specialist (dietitian) to adjust your eating plan to your individual  calorie needs. This information is not intended to replace advice given to you by your health care provider. Make sure you discuss any questions you have with your health care provider. Document Released: 02/12/2011 Document Revised: 02/17/2016 Document Reviewed: 02/17/2016 Elsevier Interactive Patient Education  2018 Elsevier Inc. Hypoglycemia Hypoglycemia is when the sugar (glucose) level in the blood is too low. Symptoms  of low blood sugar may include:  Feeling: ? Hungry. ? Worried or nervous (anxious). ? Sweaty and clammy. ? Confused. ? Dizzy. ? Sleepy. ? Sick to your stomach (nauseous).  Having: ? A fast heartbeat. ? A headache. ? A change in your vision. ? Jerky movements that you cannot control (seizure). ? Nightmares. ? Tingling or no feeling (numbness) around the mouth, lips, or tongue.  Having trouble with: ? Talking. ? Paying attention (concentrating). ? Moving (coordination). ? Sleeping.  Shaking.  Passing out (fainting).  Getting upset easily (irritability).  Low blood sugar can happen to people who have diabetes and people who do not have diabetes. Low blood sugar can happen quickly, and it can be an emergency. Treating Low Blood Sugar Low blood sugar is often treated by eating or drinking something sugary right away. If you can think clearly and swallow safely, follow the 15:15 rule:  Take 15 grams of a fast-acting carb (carbohydrate). Some fast-acting carbs are: ? 1 tube of glucose gel. ? 3 sugar tablets (glucose pills). ? 6-8 pieces of hard candy. ? 4 oz (120 mL) of fruit juice. ? 4 oz (120 mL) of regular (not diet) soda.  Check your blood sugar 15 minutes after you take the carb.  If your blood sugar is still at or below 70 mg/dL (3.9 mmol/L), take 15 grams of a carb again.  If your blood sugar does not go above 70 mg/dL (3.9 mmol/L) after 3 tries, get help right away.  After your blood sugar goes back to normal, eat a meal or a snack within 1 hour.  Treating Very Low Blood Sugar If your blood sugar is at or below 54 mg/dL (3 mmol/L), you have very low blood sugar (severe hypoglycemia). This is an emergency. Do not wait to see if the symptoms will go away. Get medical help right away. Call your local emergency services (911 in the [place] Do not drive yourself to the hospital. If you have very low blood sugar and you cannot eat or drink, you may need a glucagon shot  (injection). A family member or friend should learn how to check your blood sugar and how to give you a glucagon shot. Ask your doctor if you need to have a glucagon shot kit at home. Follow these instructions at home: General instructions  Avoid any diets that cause you to not eat enough food. Talk with your doctor before you start any new diet.  Take over-the-counter and prescription medicines only as told by your doctor.  Limit alcohol to no more than 1 drink per day for nonpregnant women and 2 drinks per day for men. One drink equals 12 oz of beer, 5 oz of wine, or 1 oz of hard liquor.  Keep all follow-up visits as told by your doctor. This is important. If You Have Diabetes:   Make sure you know the symptoms of low blood sugar.  Always keep a source of sugar with you, such as: ? Sugar. ? Sugar tablets. ? Glucose gel. ? Fruit juice. ? Regular soda (not diet soda). ? Milk. ? Hard candy. ? Honey.    Take your medicines as told.  Follow your exercise and meal plan. ? Eat on time. Do not skip meals. ? Follow your sick day plan when you cannot eat or drink normally. Make this plan ahead of time with your doctor.  Check your blood sugar as often as told by your doctor. Always check before and after exercise.  Share your diabetes care plan with: ? Your work or school. ? People you live with.  Check your pee (urine) for ketones: ? When you are sick. ? As told by your doctor.  Carry a card or wear jewelry that says you have diabetes. If You Have Low Blood Sugar From Other Causes:   Check your blood sugar as often as told by your doctor.  Follow instructions from your doctor about what you cannot eat or drink. Contact a doctor if:  You have trouble keeping your blood sugar in your target range.  You have low blood sugar often. Get help right away if:  You still have symptoms after you eat or drink something sugary.  Your blood sugar is at or below 54 mg/dL (3  mmol/L).  You have jerky movements that you cannot control.  You pass out. These symptoms may be an emergency. Do not wait to see if the symptoms will go away. Get medical help right away. Call your local emergency services (911 in the [place] Do not drive yourself to the hospital. This information is not intended to replace advice given to you by your health care provider. Make sure you discuss any questions you have with your health care provider. Document Released: 05/20/2009 Document Revised: 08/01/2015 Document Reviewed: 03/29/2015 Elsevier Interactive Patient Education  [employer].

## 2018-01-20 LAB — BASIC METABOLIC PANEL
BUN/Creatinine Ratio: 15 (ref 12–28)
BUN: 22 mg/dL (ref 8–27)
CO2: 25 mmol/L (ref 20–29)
Calcium: 9.1 mg/dL (ref 8.7–10.3)
Chloride: 106 mmol/L (ref 96–106)
Creatinine, Ser: 1.47 mg/dL — ABNORMAL HIGH (ref 0.57–1.00)
GFR calc Af Amer: 43 mL/min/{1.73_m2} — ABNORMAL LOW (ref >59)
GFR calc non Af Amer: 37 mL/min/{1.73_m2} — ABNORMAL LOW (ref >59)
Glucose: 46 mg/dL — ABNORMAL LOW (ref 65–99)
Potassium: 4.7 mmol/L (ref 3.5–5.2)
Sodium: 145 mmol/L — ABNORMAL HIGH (ref 134–144)

## 2018-01-20 NOTE — Telephone Encounter (Signed)
Can refer to podiatry for this.

## 2018-01-31 ENCOUNTER — Telehealth: Payer: Self-pay

## 2018-01-31 ENCOUNTER — Encounter: Payer: Self-pay | Admitting: Family Medicine

## 2018-01-31 NOTE — Telephone Encounter (Signed)
Sent to Dr. Hyman HopesJegede to sign.

## 2018-01-31 NOTE — Telephone Encounter (Signed)
Please let the provider send the note to me. Ensure it says co-sign needed. Thanks

## 2018-01-31 NOTE — Telephone Encounter (Signed)
Patient needs to have last office visit notes signed by Dr. Hyman HopesJegede so that insurance will pay for diabetic shoes. Please call patient once this is done and also contact [facility] so they can get notes and fax to insurance.

## 2018-01-31 NOTE — Telephone Encounter (Signed)
Dr. Hyman HopesJegede, Can you co-sign this patient's last visit note in the chart? Please advise. Thanks!

## 2018-01-31 NOTE — Telephone Encounter (Signed)
Debby BudAndre,  Can you send patient's last note to Dr. Hyman HopesJegede to co sign? Please advise. Thanks!

## 2018-02-02 NOTE — Telephone Encounter (Signed)
Called and left  A message with [facility] and called patient and left a message advising this has been done. Thanks!

## 2018-02-05 ENCOUNTER — Encounter (HOSPITAL_COMMUNITY): Payer: Self-pay | Admitting: *Deleted

## 2018-02-05 ENCOUNTER — Ambulatory Visit (HOSPITAL_COMMUNITY)
Admission: EM | Admit: 2018-02-05 | Discharge: 2018-02-05 | Disposition: A | Discharge status: Home / Self Care | Payer: Medicare Other

## 2018-02-05 DIAGNOSIS — S61211A Laceration without foreign body of left index finger without damage to nail, initial encounter: Secondary | ICD-10-CM | POA: Diagnosis not present

## 2018-02-05 NOTE — ED Triage Notes (Signed)
Reports cutting a bag with a knife when left index finger sustained laceration approx 1 hr ago.  Bleeding controlled.

## 2018-02-05 NOTE — Discharge Instructions (Signed)
WOUND CARE  Keep area clean and dry for 24 hours.   Continue daily cleansing with soap and water until stitches/staples are removed.  Do not apply any ointments or creams to the wound while the glue is in place, as this may cause delayed healing.  Notify the office if you experience any of the following signs of infection: Swelling, redness, pus drainage, streaking, fever >101.0 F  Notify the office if you experience excessive bleeding that does not stop after 15-20 minutes of constant, firm pressure.

## 2018-02-05 NOTE — ED Provider Notes (Signed)
[place]    CSN: [id number] Arrival date & time: 02/05/18  1147     History   Chief Complaint Chief Complaint  Patient presents with  . Laceration    HPI Rachel Gonzalez is a 66 y.o. female history of hypertension, DM type II presenting today for evaluation of finger laceration.  Patient was cutting meat with a knife, accidentally cut her left index finger.  Bleeding controlled.  Concerned that she was having stinging sensations.  Tetanus up-to-date, last given in 2016.  Denies numbness or tingling.  Denies difficulty moving finger.  Has been applying pressure since incident.  HPI  Past Medical History:  Diagnosis Date  . Anemia   . Diabetes mellitus   . Hypertension   . Renal disorder     Patient Active Problem List   Diagnosis Date Noted  . Stage 3 chronic kidney disease ([place]) 12/01/2016  . Frequent falls 04/28/2016  . Right arm weakness 04/28/2016  . Bilateral lower extremity edema 04/28/2016  . Screening for colon cancer 04/28/2016  . Abnormal urinalysis 09/13/2014  . Chest pain 09/13/2014  . Abdominal pain, recurrent 09/13/2014  . Dehydration with hyponatremia 09/13/2014  . Acute hyperkalemia 09/13/2014  . Acute renal failure superimposed on stage 3 chronic kidney disease ([place]) 09/13/2014  . Anemia 05/06/2014  . Diabetic neuropathy, type II diabetes mellitus ([place]) 05/06/2014  . Obesity (BMI 30-39.9) 05/06/2014  . Cellulitis of right foot 05/06/2014  . Essential hypertension 03/07/2011  . 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 Right arm pain 08/04/2006  . Osteoarthritis 02/14/2006  . Diabetes mellitus type 2, uncontrolled ([place]) 01/11/2006    Past Surgical History:  Procedure Laterality Date  . AMPUTATION Right 05/08/2014   Procedure: AMPUTATION RAY, right great toe;  Surgeon: Newt Minion, MD;  Location: [facility];  Service: Orthopedics;  Laterality: Right;  . arm surgery     . CESAREAN SECTION    . I&D EXTREMITY Right  05/08/2014   Procedure: IRRIGATION AND DEBRIDEMENT FOOT;  Surgeon: Newt Minion, MD;  Location: [facility];  Service: Orthopedics;  Laterality: Right;    OB History   None      Home Medications    Prior to Admission medications   Medication Sig Start Date End Date Taking? Authorizing Provider  acetaminophen-codeine (TYLENOL #3) 300-30 MG tablet TAKE 1 TABLET BY MOUTH EVERY 6 HOURS AS NEEDED FOR MODERATE PAIN 01/19/18  Yes Lanae Boast, FNP  gabapentin (NEURONTIN) 300 MG capsule Take 1 capsule (300 mg total) by mouth 2 (two) times daily. 09/01/17  Yes Dorena Dew, FNP  Insulin Glargine (LANTUS SOLOSTAR) 100 UNIT/ML Solostar Pen INJECT 45 UNITS SUBCUTANEOUSLY ONCE DAILY AT  Dinner time. 01/19/18  Yes Lanae Boast, FNP  levocetirizine (XYZAL) 5 MG tablet TAKE ONE TABLET BY MOUTH ONCE DAILY IN THE EVENING 01/19/18  Yes Lanae Boast, FNP  lisinopril (PRINIVIL,ZESTRIL) 20 MG tablet TAKE 1 TABLET BY MOUTH ONCE DAILY 11/03/17  Yes Lanae Boast, FNP  nystatin (NYSTATIN) powder Apply topically 4 (four) times daily. Patient taking differently: Apply 1 g topically 4 (four) times daily.  04/01/17  Yes Dorena Dew, FNP  Blood Glucose Monitoring Suppl (ACCU-CHEK AVIVA PLUS) w/Device KIT 1 Device by Does not apply route once. Use as directed to check blood sugar 06/11/15   Dorena Dew, FNP  Blood Glucose Monitoring Suppl (ONE TOUCH ULTRA SYSTEM KIT) w/Device KIT Test three times daily. Dx Type 2 Diabetes. 06/10/15   Smith Robert,  Asencion Partridge, FNP  Continuous Blood Gluc Receiver (FREESTYLE LIBRE 14 DAY READER) DEVI 1 each by Does not apply route 4 (four) times daily -  before meals and at bedtime. 06/30/17   Dorena Dew, FNP  furosemide (LASIX) 40 MG tablet Take 1 tablet (40 mg total) by mouth daily. 01/19/18   Lanae Boast, FNP  glucose blood (ACCU-CHEK AVIVA) test strip Use to check blood sugar 3 times daily and at bedtime. 06/11/15   Dorena Dew, FNP  Lancet Devices [facility])  lancets Use as instructed 06/11/15   Dorena Dew, FNP  ONE TOUCH LANCETS MISC Test 3 times daily with glucometer. Dx Type 2 diabetes with unspecified complications. 06/10/15   Dorena Dew, FNP  RELION PEN NEEDLE 31G/8MM 31G X 8 MM MISC USE AS DIRECTED 4 TIMES DAILY 12/20/17   Lanae Boast, FNP  sucralfate (CARAFATE) 1 g tablet Take 1 tablet (1 g total) by mouth 4 (four) times daily -  with meals and at bedtime. Patient not taking: Reported on 01/19/2018 08/14/17 01/19/18  Romie Jumper, PA-C    Family History Family History  Problem Relation Age of Onset  . Diabetes Mother     Social History Social History   Tobacco Use  . Smoking status: Never Smoker  . Smokeless tobacco: Never Used  Substance Use Topics  . Alcohol use: No  . Drug use: No     Allergies   Tramadol and Adhesive [tape]   Review of Systems Review of Systems  Constitutional: Negative for fatigue and fever.  Eyes: Negative for visual disturbance.  Respiratory: Negative for shortness of breath.   Cardiovascular: Negative for chest pain.  Gastrointestinal: Negative for abdominal pain, nausea and vomiting.  Musculoskeletal: Negative for arthralgias, joint swelling and myalgias.  Skin: Positive for wound. Negative for color change and rash.  Neurological: Negative for dizziness, weakness, light-headedness and headaches.     Physical Exam Triage Vital Signs ED Triage Vitals  Enc Vitals Group     BP 02/05/18 1253 (!) 155/71     Pulse Rate 02/05/18 1253 76     Resp 02/05/18 1253 16     Temp 02/05/18 1253 98 F (36.7 C)     Temp Source 02/05/18 1253 Oral     SpO2 02/05/18 1253 100 %     Weight --      Height --      Head Circumference --      Peak Flow --      Pain Score 02/05/18 1254 5     Pain Loc --      Pain Edu? --      Excl. in [place]? --    No data found.  Updated Vital Signs BP (!) 155/71   Pulse 76   Temp 98 F (36.7 C) (Oral)   Resp 16   SpO2 100%   Visual Acuity Right  Eye Distance:   Left Eye Distance:   Bilateral Distance:    Right Eye Near:   Left Eye Near:    Bilateral Near:     Physical Exam  Constitutional: She is oriented to person, place, and time. She appears well-developed and well-nourished.  No acute distress  HENT:  Head: Normocephalic and atraumatic.  Nose: Nose normal.  Eyes: Conjunctivae are normal.  Neck: Neck supple.  Cardiovascular: Normal rate.  Pulmonary/Chest: Effort normal. No respiratory distress.  Abdominal: She exhibits no distension.  Musculoskeletal: Normal range of motion.  Full active range of motion  of left index finger  Neurological: She is alert and oriented to person, place, and time.  Skin: Skin is warm and dry.  Left index finger with small 0.5 cm superficial abrasion, well reapproximated, no active bleeding  Psychiatric: She has a normal mood and affect.  Nursing note and vitals reviewed.    UC Treatments / Results  Labs (all labs ordered are listed, but only abnormal results are displayed) Labs Reviewed - No data to display  EKG None  Radiology No results found.  Procedures Procedures (including critical care time)  Medications Ordered in UC Medications - No data to display  Initial Impression / Assessment and Plan / UC Course  I have reviewed the triage vital signs and the nursing notes.  Pertinent labs & imaging results that were available during my care of the patient were reviewed by me and considered in my medical decision making (see chart for details).     Superficial laceration, wound cleansed with warm soapy water, Dermabond applied with Band-Aid.  Would expect self healing.  Continue to monitor for signs of infection.  Tylenol for discomfort.Discussed strict return precautions. Patient verbalized understanding and is agreeable with plan.  Final Clinical Impressions(s) / UC Diagnoses   Final diagnoses:  Laceration of left index finger without foreign body without damage to  nail, initial encounter     Discharge Instructions     WOUND CARE . Keep area clean and dry for 24 hours.  . Continue daily cleansing with soap and water until stitches/staples are removed. . Do not apply any ointments or creams to the wound while the glue is in place, as this may cause delayed healing. . Notify the office if you experience any of the following signs of infection: Swelling, redness, pus drainage, streaking, fever >101.0 F . Notify the office if you experience excessive bleeding that does not stop after 15-20 minutes of constant, firm pressure.   ED Prescriptions    None     Controlled Substance Prescriptions [place] Controlled Substance Registry consulted? Not Applicable   Janith Lima, [place] 02/05/18 1332

## 2018-02-08 NOTE — Progress Notes (Deleted)
Name: Rachel Gonzalez  MRN/ DOB: [id number], [birth year]   Age/ Sex: 67 y.o., female    PCP: Lanae Boast, [place]   Reason for Endocrinology Evaluation: Type {NUMBERS 1 OR 2:522190} Diabetes Mellitus     Date of Initial Endocrinology Visit: 02/08/2018     PATIENT IDENTIFIER: Rachel Gonzalez is a 67 y.o. female with a past medical history of anemia, diabetes, HTN, and CKD. The patient presented for initial endocrinology clinic visit on 02/08/2018 for consultative assistance with her diabetes management.    HPI: Ms. Steinmiller was    Diagnosed with DM *** Prior Medications tried/Intolerance: *** Currently checking blood sugars *** x / day,  before breakfast and ***.  Hypoglycemia episodes : ***               Symptoms: ***                 Frequency: ***/  Hemoglobin A1c has ranged from *** in ***, peaking at *** in***. Patient required assistance for hypoglycemia:  Patient has required hospitalization within the last 1 year from hyper or hypoglycemia:   In terms of diet, the patient ***   HOME DIABETES REGIMEN: Lantus 45 units daily  Novolog stopped 11/13 due to hypoglycemia.   Statin: {Yes/No:11203} ACE-I/ARB: {YES/NO:17245} Prior Diabetic Education: {Yes/No:11203}   METER DOWNLOAD SUMMARY: Date range evaluated: *** Fingerstick Blood Glucose Tests = *** Average Number Tests/Day = *** Overall Mean FS Glucose = *** Standard Deviation = ***  BG Ranges: Low = *** High = ***   Hypoglycemic Events/30 Days: BG < 50 = *** Episodes of symptomatic severe hypoglycemia = ***   DIABETIC COMPLICATIONS: Microvascular complications:   ***  Denies: ***  Last eye exam: Completed   Macrovascular complications:   ***  Denies: CAD, PVD, CVA   PAST HISTORY: Past Medical History:  Past Medical History:  Diagnosis Date  . Anemia   . Diabetes mellitus   . Hypertension   . Renal disorder     Past Surgical History:  Past Surgical History:  Procedure Laterality Date    . AMPUTATION Right 05/08/2014   Procedure: AMPUTATION RAY, right great toe;  Surgeon: Newt Minion, MD;  Location: [facility];  Service: Orthopedics;  Laterality: Right;  . arm surgery     . CESAREAN SECTION    . I&D EXTREMITY Right 05/08/2014   Procedure: IRRIGATION AND DEBRIDEMENT FOOT;  Surgeon: Newt Minion, MD;  Location: [facility];  Service: Orthopedics;  Laterality: Right;      Social History:  reports that she has never smoked. She has never used smokeless tobacco. She reports that she does not drink alcohol or use drugs. Family History:  Family History  Problem Relation Age of Onset  . Diabetes Mother       HOME MEDICATIONS: Allergies as of 02/09/2018      Reactions   Tramadol Nausea And Vomiting   Adhesive [tape] Itching      Medication List        Accurate as of 02/08/18  8:50 AM. Always use your most recent med list.          accu-chek softclix lancets Use as instructed   acetaminophen-codeine 300-30 MG tablet Commonly known as:  TYLENOL #3 TAKE 1 TABLET BY MOUTH EVERY 6 HOURS AS NEEDED FOR MODERATE PAIN   FREESTYLE LIBRE 14 DAY READER Devi 1 each by Does not apply route 4 (four) times daily -  before meals and at bedtime.  furosemide 40 MG tablet Commonly known as:  LASIX Take 1 tablet (40 mg total) by mouth daily.   gabapentin 300 MG capsule Commonly known as:  NEURONTIN Take 1 capsule (300 mg total) by mouth 2 (two) times daily.   glucose blood test strip Use to check blood sugar 3 times daily and at bedtime.   Insulin Glargine 100 UNIT/ML Solostar Pen Commonly known as:  LANTUS INJECT 45 UNITS SUBCUTANEOUSLY ONCE DAILY AT  [employer] time.   levocetirizine 5 MG tablet Commonly known as:  XYZAL TAKE ONE TABLET BY MOUTH ONCE DAILY IN THE EVENING   lisinopril 20 MG tablet Commonly known as:  PRINIVIL,ZESTRIL TAKE 1 TABLET BY MOUTH ONCE DAILY   nystatin powder Commonly known as:  MYCOSTATIN/NYSTOP Apply topically 4 (four) times daily.   ONE TOUCH  LANCETS Misc Test 3 times daily with glucometer. Dx Type 2 diabetes with unspecified complications.   ONE TOUCH ULTRA SYSTEM KIT w/Device Kit Test three times daily. Dx Type 2 Diabetes.   ACCU-CHEK AVIVA PLUS w/Device Kit 1 Device by Does not apply route once. Use as directed to check blood sugar   RELION PEN NEEDLE 31G/8MM 31G X 8 MM Misc Generic drug:  Insulin Pen Needle USE AS DIRECTED 4 TIMES DAILY   sucralfate 1 g tablet Commonly known as:  CARAFATE Take 1 tablet (1 g total) by mouth 4 (four) times daily -  with meals and at bedtime.        ALLERGIES: Allergies  Allergen Reactions  . Tramadol Nausea And Vomiting  . Adhesive [Tape] Itching     REVIEW OF SYSTEMS: A comprehensive ROS was conducted with the patient and is negative except as per HPI and below:  ROS    OBJECTIVE:   VITAL SIGNS: There were no vitals taken for this visit.   PHYSICAL EXAM:  General: Pt appears well and is in NAD  Hydration: Well-hydrated with moist mucous membranes and good skin turgor  HEENT: Head: Unremarkable with good dentition. Oropharynx clear without exudate.  Eyes: External eye exam normal without stare, lid lag or exophthalmos.  EOM intact.  PERRL.  Neck: General: Supple without adenopathy or carotid bruits. Thyroid: Thyroid size normal.  No goiter or nodules appreciated. No thyroid bruit.  Lungs: Clear with good BS bilat with no rales, rhonchi, or wheezes  Heart: RRR with normal S1 and S2 and no gallops; no murmurs; no rub  Abdomen: Normoactive bowel sounds, soft, nontender, without masses or organomegaly palpable  Extremities:  Lower extremities - No pretibial edema. No lesions.  Skin: Normal texture and temperature to palpation. No rash noted. No Acanthosis nigricans/skin tags. No lipohypertrophy.  Neuro: MS is good with appropriate affect, pt is alert and Ox3    DM foot exam:    DATA REVIEWED:  Lab Results  Component Value Date   HGBA1C 8.3 (A) 01/19/2018    HGBA1C 10.3 (A) 10/18/2017   HGBA1C 9.7 (A) 07/30/2017   Lab Results  Component Value Date   MICROALBUR 20.4 07/27/2016   LDLCALC 48 11/30/2016   CREATININE 1.47 (H) 01/19/2018   Lab Results  Component Value Date   MICRALBCREAT 194 (H) 07/27/2016    Lab Results  Component Value Date   CHOL 120 11/30/2016   HDL 58 11/30/2016   LDLCALC 48 11/30/2016   LDLDIRECT 74 08/10/2007   TRIG 67 11/30/2016   CHOLHDL 2.1 11/30/2016        ASSESSMENT / PLAN / RECOMMENDATIONS:   1) Type *** Diabetes Mellitus, ***  controlled, With*** complications - Most recent A1c of 8.3 %. Goal A1c < 7.0 %.    Plan: GENERAL:  ***  MEDICATIONS:  ***  EDUCATION / INSTRUCTIONS:  BG monitoring instructions: Patient is instructed to check her blood sugars *** times a day, ***.  Call [facility] if: BG persistently < 70 or > 300. . I reviewed the Rule of 15 for the treatment of hypoglycemia in detail with the patient. Literature supplied.   2) Diabetic complications:   Eye: Does *** have known diabetic retinopathy. Last eye exam was   Neuro/ Feet: Does *** have known diabetic peripheral neuropathy.  Renal: Patient does *** have known baseline CKD. She is *** on an ACEI/ARB at present.   3) Lipids: Patient is *** on a statin.    4) Hypertension: ***  at goal of < 140/90 mmHg.       Signed electronically by: Mack Guise, MD  [facility]  [facility] [address] [place], [place] [address] Phone: [phone] FAX: [phone]   CC: Lanae Boast, [place] [address] Phone: [phone]  Fax: [phone]    Return to Endocrinology clinic as below: Future Appointments  Date Time Provider [place]  02/09/2018  8:30 AM Falcon Mccaskey, Melanie Crazier, MD [facility] None  02/16/2018  2:40 PM Lanae Boast, [place] [facility] None  04/05/2018  3:15 PM Garrel Ridgel, DPM [facility]

## 2018-02-09 ENCOUNTER — Ambulatory Visit: Payer: Medicare Other | Admitting: Internal Medicine

## 2018-02-09 DIAGNOSIS — Z0289 Encounter for other administrative examinations: Secondary | ICD-10-CM

## 2018-02-10 DIAGNOSIS — E113522 Type 2 diabetes mellitus with proliferative diabetic retinopathy with traction retinal detachment involving the macula, left eye: Secondary | ICD-10-CM | POA: Diagnosis not present

## 2018-02-10 DIAGNOSIS — H3582 Retinal ischemia: Secondary | ICD-10-CM | POA: Diagnosis not present

## 2018-02-10 DIAGNOSIS — E113521 Type 2 diabetes mellitus with proliferative diabetic retinopathy with traction retinal detachment involving the macula, right eye: Secondary | ICD-10-CM | POA: Diagnosis not present

## 2018-02-10 DIAGNOSIS — H21543 Posterior synechiae (iris), bilateral: Secondary | ICD-10-CM | POA: Diagnosis not present

## 2018-02-16 ENCOUNTER — Ambulatory Visit (INDEPENDENT_AMBULATORY_CARE_PROVIDER_SITE_OTHER): Payer: Medicare Other | Admitting: Family Medicine

## 2018-02-16 VITALS — BP 188/84 | HR 84 | Temp 98.0°F | Resp 14 | Ht 64.0 in | Wt 230.0 lb

## 2018-02-16 DIAGNOSIS — E1140 Type 2 diabetes mellitus with diabetic neuropathy, unspecified: Secondary | ICD-10-CM

## 2018-02-16 DIAGNOSIS — I10 Essential (primary) hypertension: Secondary | ICD-10-CM

## 2018-02-16 DIAGNOSIS — Z794 Long term (current) use of insulin: Secondary | ICD-10-CM

## 2018-02-16 LAB — POCT URINALYSIS DIPSTICK
Bilirubin, UA: NEGATIVE
Glucose, UA: POSITIVE — AB
Ketones, UA: NEGATIVE
Nitrite, UA: NEGATIVE
Protein, UA: POSITIVE — AB
Spec Grav, UA: 1.01 (ref 1.010–1.025)
Urobilinogen, UA: 0.2 E.U./dL
pH, UA: 5.5 (ref 5.0–8.0)

## 2018-02-16 LAB — GLUCOSE, POCT (MANUAL RESULT ENTRY): POC Glucose: 363 mg/dl — AB (ref 70–99)

## 2018-02-16 MED ORDER — BLOOD GLUCOSE MONITOR KIT: PACK | 0 refills | Status: DC

## 2018-02-16 NOTE — Progress Notes (Signed)
Patient [name]   Progress Note: General Provider: Lanae Boast, FNP  SUBJECTIVE:   Rachel Gonzalez is a 67 y.o. female who  has a past medical history of Anemia, Diabetes mellitus, Hypertension, and Renal disorder.. Patient presents today for Hypertension   Patient presents for follow-up on her diabetes.  Last A1c 8.3 on 01/19/2018.   At last visit, her lantus was increased to 45 units.Patient is not monitoring her glucose level. Denies hypoglycemic episodes. She has not been following a carbohydrate modified diet. She is not exercising.    Patient states that she needs a script for diabetic shoes. She was seen by Dr. Milinda Pointer at [facility] on 01/04/2018.  Recommended by Dr. Milinda Pointer  Diabetes with diabetic peripheral neuropathy hammertoe deformity history of osteomyelitis with amputation and pain in limb secondary to onychomycosis.  Plan: Debrided toenails 1 through 5 left and 2 through 5 right.   would like to see about her getting her diabetic shoes with toe filler.      Review of Systems  Constitutional: Negative.   HENT: Negative.   Eyes: Negative.   Respiratory: Negative.   Cardiovascular: Negative.   Gastrointestinal: Negative.   Genitourinary: Negative.   Musculoskeletal: Negative.   Skin: Negative.   Neurological: Negative.   Psychiatric/Behavioral: Negative.      OBJECTIVE: BP (!) 188/84 (BP Location: Right Arm, Patient Position: Sitting, Cuff Size: Large)   Pulse 84   Temp 98 F (36.7 C) (Oral)   Resp 14   Ht _0  (1.626 m)   Wt 230 lb (104.3 kg)   SpO2 100%   BMI 39.48 kg/m   Wt Readings from Last 3 Encounters:  02/16/18 230 lb (104.3 kg)  01/19/18 229 lb (103.9 kg)  10/18/17 224 lb (101.6 kg)     Physical Exam Vitals signs and nursing note reviewed.  Constitutional:      General: She is not in acute distress.    Appearance: She is well-developed and well-nourished.  HENT:     Head: Normocephalic  and atraumatic.  Eyes:     Extraocular Movements: EOM normal.     Conjunctiva/sclera: Conjunctivae normal.     Pupils: Pupils are equal, round, and reactive to light.  Neck:     Musculoskeletal: Normal range of motion.  Cardiovascular:     Rate and Rhythm: Normal rate and regular rhythm.     Pulses: Intact distal pulses.     Heart sounds: Normal heart sounds.  Pulmonary:     Effort: Pulmonary effort is normal. No respiratory distress.     Breath sounds: Normal breath sounds.  Abdominal:     General: Bowel sounds are normal. There is no distension.     Palpations: Abdomen is soft.  Musculoskeletal: Normal range of motion.  Skin:    General: Skin is warm and dry.  Neurological:     Mental Status: She is alert and oriented to person, place, and time.  Psychiatric:        Mood and Affect: Mood and affect and mood normal.        Behavior: Behavior normal.        Thought Content: Thought content normal.        Judgment: Judgment normal.     ASSESSMENT/PLAN: 1. Essential hypertension Continue with current medications.  - Urinalysis Dipstick  2. Type 2 diabetes mellitus with diabetic neuropathy, with long-term current use of insulin [facility]) Referral made for nutrition and diabetes  education. The patient is asked to make an attempt to improve diet and exercise patterns to aid in medical management of this problem.  - blood glucose meter kit and supplies KIT; Dispense based on patient and insurance preference. Use up to four times daily as directed. (FOR ICD-9 250.00, 250.01).  Dispense: 1 each; Refill: 0 - Glucose (CBG) - Ambulatory referral to diabetic education - PR DIABETIC CUSTOM MOLDED SHOE No changes to medications.    Discussed diet and exercise to help regulate diabetes. Instructed patient to check her blood glucose daily, specifically FBS.     The patient was given clear instructions to go to ER or return to medical center if symptoms do not improve, worsen or new  problems develop. The patient verbalized understanding and agreed with plan of care.   Ms. Doug Sou. Nathaneil Canary, FNP-BC Patient [facility] [place], [place] [address] [phone]  Time Spent: 25 minutes face-to-face with this patient discussing problems, treatments, and answering patient's questions.

## 2018-02-17 ENCOUNTER — Telehealth: Payer: Self-pay

## 2018-02-17 NOTE — Telephone Encounter (Signed)
Pharmacy states that the script for the meter has to have ICD-10 codes, which meter you want, sign the script, and directions because patient has medicare part B.

## 2018-02-17 NOTE — Telephone Encounter (Signed)
Pharmacy states to try the [employer] Guide

## 2018-02-17 NOTE — Telephone Encounter (Signed)
Can you please ask the pharmacy what meter is covered. I will resend the script with the correct information.  thanks

## 2018-03-11 ENCOUNTER — Other Ambulatory Visit: Payer: Self-pay

## 2018-03-11 ENCOUNTER — Encounter (HOSPITAL_COMMUNITY): Payer: Self-pay | Admitting: *Deleted

## 2018-03-11 NOTE — Progress Notes (Signed)
Spoke with pt for pre-op call. Pt denies cardiac history. Pt is a type 2 diabetic. Last A1C was 8.3 on 01/19/18. Pt states she does not have a meter at this time to check her blood sugar at home. Pt instructed to take 1/2 of her regular dose of Lantus Insulin Monday evening, she will take 22 units. Pt voiced understanding.

## 2018-03-14 ENCOUNTER — Ambulatory Visit: Payer: Self-pay | Admitting: Ophthalmology

## 2018-03-14 NOTE — H&P (Signed)
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 Anemia   . Arthritis    right finger  . Diabetes mellitus   . GERD (gastroesophageal reflux disease)   . Hypertension   . Renal disorder     Pertinent ocular history:  Proliferative diabetic retinopathy  Pertinent family history:  Family History  Problem Relation Age of Onset  . Diabetes Mother     General:  Healthy appearing patient in no distress.    Eyes:    Acuity OD 20/50   External: normal  Anterior segment:no NVI  Fundus: PDR with TRD involving the macula OD   Impression: PDR with TRD OD   Plan: Tractional retinal detachment repair right eye  Harrold Donath

## 2018-03-14 NOTE — H&P (Deleted)
  The note originally documented on this encounter has been moved the the encounter in which it belongs.  

## 2018-03-15 ENCOUNTER — Ambulatory Visit (HOSPITAL_COMMUNITY): Payer: Medicare HMO | Admitting: Anesthesiology

## 2018-03-15 ENCOUNTER — Encounter (HOSPITAL_COMMUNITY): Payer: Self-pay

## 2018-03-15 ENCOUNTER — Other Ambulatory Visit: Payer: Self-pay

## 2018-03-15 ENCOUNTER — Ambulatory Visit (HOSPITAL_COMMUNITY)
Admission: RE | Admit: 2018-03-15 | Discharge: 2018-03-15 | Disposition: A | Discharge status: Home / Self Care | Payer: Medicare HMO | Source: Ambulatory Visit | Attending: Ophthalmology | Admitting: Ophthalmology

## 2018-03-15 ENCOUNTER — Encounter (HOSPITAL_COMMUNITY)
Admission: RE | Disposition: A | Discharge status: Home / Self Care | Payer: Self-pay | Source: Ambulatory Visit | Attending: Ophthalmology

## 2018-03-15 DIAGNOSIS — I10 Essential (primary) hypertension: Secondary | ICD-10-CM | POA: Diagnosis not present

## 2018-03-15 DIAGNOSIS — K219 Gastro-esophageal reflux disease without esophagitis: Secondary | ICD-10-CM | POA: Diagnosis not present

## 2018-03-15 DIAGNOSIS — N183 Chronic kidney disease, stage 3 (moderate): Secondary | ICD-10-CM | POA: Diagnosis not present

## 2018-03-15 DIAGNOSIS — I129 Hypertensive chronic kidney disease with stage 1 through stage 4 chronic kidney disease, or unspecified chronic kidney disease: Secondary | ICD-10-CM | POA: Diagnosis not present

## 2018-03-15 DIAGNOSIS — E113521 Type 2 diabetes mellitus with proliferative diabetic retinopathy with traction retinal detachment involving the macula, right eye: Secondary | ICD-10-CM | POA: Insufficient documentation

## 2018-03-15 DIAGNOSIS — Z794 Long term (current) use of insulin: Secondary | ICD-10-CM | POA: Insufficient documentation

## 2018-03-15 DIAGNOSIS — E1122 Type 2 diabetes mellitus with diabetic chronic kidney disease: Secondary | ICD-10-CM | POA: Diagnosis not present

## 2018-03-15 HISTORY — PX: MEMBRANE PEEL: SHX5967

## 2018-03-15 HISTORY — PX: REPAIR OF COMPLEX TRACTION RETINAL DETACHMENT: SHX6217

## 2018-03-15 HISTORY — PX: PHOTOCOAGULATION WITH LASER: SHX6027

## 2018-03-15 HISTORY — DX: Unspecified osteoarthritis, unspecified site: M19.90

## 2018-03-15 HISTORY — DX: Gastro-esophageal reflux disease without esophagitis: K21.9

## 2018-03-15 LAB — CBC
HCT: 36.5 % (ref 36.0–46.0)
HEMOGLOBIN: 11 g/dL — AB (ref 12.0–15.0)
MCH: 26.1 pg (ref 26.0–34.0)
MCHC: 30.1 g/dL (ref 30.0–36.0)
MCV: 86.5 fL (ref 80.0–100.0)
Platelets: 234 10*3/uL (ref 150–400)
RBC: 4.22 MIL/uL (ref 3.87–5.11)
RDW: 12.7 % (ref 11.5–15.5)
WBC: 6.4 10*3/uL (ref 4.0–10.5)
nRBC: 0 % (ref 0.0–0.2)

## 2018-03-15 LAB — GLUCOSE, CAPILLARY
Glucose-Capillary: 191 mg/dL — ABNORMAL HIGH (ref 70–99)
Glucose-Capillary: 174 mg/dL — ABNORMAL HIGH (ref 70–99)
Glucose-Capillary: 145 mg/dL — ABNORMAL HIGH (ref 70–99)
Glucose-Capillary: 130 mg/dL — ABNORMAL HIGH (ref 70–99)

## 2018-03-15 LAB — BASIC METABOLIC PANEL
Anion gap: 8 (ref 5–15)
BUN: 29 mg/dL — ABNORMAL HIGH (ref 8–23)
CHLORIDE: 102 mmol/L (ref 98–111)
CO2: 28 mmol/L (ref 22–32)
Calcium: 9.3 mg/dL (ref 8.9–10.3)
Creatinine, Ser: 1.79 mg/dL — ABNORMAL HIGH (ref 0.44–1.00)
GFR calc Af Amer: 34 mL/min — ABNORMAL LOW (ref >60)
GFR calc non Af Amer: 29 mL/min — ABNORMAL LOW (ref >60)
Glucose, Bld: 196 mg/dL — ABNORMAL HIGH (ref 70–99)
POTASSIUM: 4.4 mmol/L (ref 3.5–5.1)
Sodium: 138 mmol/L (ref 135–145)

## 2018-03-15 SURGERY — REPAIR, RETINAL DETACHMENT, COMPLEX
Anesthesia: Monitor Anesthesia Care | Site: Eye | Laterality: Right

## 2018-03-15 MED ORDER — ATROPINE SULFATE 1 % OP SOLN
OPHTHALMIC | Status: AC
  Filled 2018-03-15: qty 5

## 2018-03-15 MED ORDER — ONDANSETRON HCL 4 MG/2ML IJ SOLN
INTRAMUSCULAR | Status: DC | PRN
  Administered 2018-03-15: 4 mg via INTRAVENOUS

## 2018-03-15 MED ORDER — BSS IO SOLN
INTRAOCULAR | Status: AC
  Filled 2018-03-15: qty 15

## 2018-03-15 MED ORDER — FENTANYL CITRATE (PF) 250 MCG/5ML IJ SOLN
INTRAMUSCULAR | Status: AC
  Filled 2018-03-15: qty 5

## 2018-03-15 MED ORDER — OFLOXACIN 0.3 % OP SOLN
1.0000 [drp] | OPHTHALMIC | Status: AC | PRN
  Administered 2018-03-15: 1 [drp] via OPHTHALMIC
  Filled 2018-03-15: qty 5

## 2018-03-15 MED ORDER — PHENYLEPHRINE HCL 2.5 % OP SOLN
1.0000 [drp] | OPHTHALMIC | Status: AC | PRN
  Administered 2018-03-15 (×3): 1 [drp] via OPHTHALMIC
  Filled 2018-03-15: qty 2

## 2018-03-15 MED ORDER — SODIUM HYALURONATE 10 MG/ML IO SOLN
INTRAOCULAR | Status: DC | PRN
  Administered 2018-03-15: 0.85 mL via INTRAOCULAR

## 2018-03-15 MED ORDER — BSS IO SOLN
INTRAOCULAR | Status: DC | PRN
  Administered 2018-03-15: 15 mL via INTRAOCULAR

## 2018-03-15 MED ORDER — LIDOCAINE 2% (20 MG/ML) 5 ML SYRINGE
INTRAMUSCULAR | Status: AC
  Filled 2018-03-15: qty 5

## 2018-03-15 MED ORDER — TETRACAINE HCL 0.5 % OP SOLN
OPHTHALMIC | Status: DC | PRN
  Administered 2018-03-15: 1 [drp] via OPHTHALMIC

## 2018-03-15 MED ORDER — DEXAMETHASONE SODIUM PHOSPHATE 10 MG/ML IJ SOLN
INTRAMUSCULAR | Status: AC
  Filled 2018-03-15: qty 1

## 2018-03-15 MED ORDER — DEXAMETHASONE SODIUM PHOSPHATE 4 MG/ML IJ SOLN
INTRAMUSCULAR | Status: DC | PRN
  Administered 2018-03-15: 4 mg via INTRAVENOUS

## 2018-03-15 MED ORDER — ONDANSETRON HCL 4 MG/2ML IJ SOLN: 4.0000 mg | Freq: Once | INTRAMUSCULAR | Status: DC | PRN

## 2018-03-15 MED ORDER — INDOCYANINE GREEN 25 MG IV SOLR
INTRAVENOUS | Status: AC
  Filled 2018-03-15: qty 25

## 2018-03-15 MED ORDER — PROPOFOL 10 MG/ML IV BOLUS
INTRAVENOUS | Status: AC
  Filled 2018-03-15: qty 20

## 2018-03-15 MED ORDER — CEFAZOLIN SUBCONJUNCTIVAL INJECTION 100 MG/0.5 ML
200.0000 mg | INJECTION | SUBCONJUNCTIVAL | Status: AC
  Administered 2018-03-15: 200 mg via SUBCONJUNCTIVAL
  Filled 2018-03-15: qty 2

## 2018-03-15 MED ORDER — PROPOFOL 10 MG/ML IV BOLUS
INTRAVENOUS | Status: DC | PRN
  Administered 2018-03-15: 30 mg via INTRAVENOUS

## 2018-03-15 MED ORDER — OFLOXACIN 0.3 % OP SOLN
1.0000 [drp] | OPHTHALMIC | Status: AC | PRN
  Administered 2018-03-15 (×2): 1 [drp] via OPHTHALMIC

## 2018-03-15 MED ORDER — FENTANYL CITRATE (PF) 100 MCG/2ML IJ SOLN
INTRAMUSCULAR | Status: DC | PRN
  Administered 2018-03-15 (×2): 25 ug via INTRAVENOUS
  Administered 2018-03-15: 50 ug via INTRAVENOUS
  Administered 2018-03-15 (×2): 25 ug via INTRAVENOUS
  Administered 2018-03-15: 50 ug via INTRAVENOUS

## 2018-03-15 MED ORDER — INDOCYANINE GREEN 25 MG IV SOLR
INTRAVENOUS | Status: DC | PRN
  Administered 2018-03-15: 25 mg via OPHTHALMIC

## 2018-03-15 MED ORDER — PROPARACAINE HCL 0.5 % OP SOLN
1.0000 [drp] | Freq: Once | OPHTHALMIC | Status: AC
  Administered 2018-03-15: 1 [drp] via OPHTHALMIC
  Filled 2018-03-15: qty 15

## 2018-03-15 MED ORDER — FENTANYL CITRATE (PF) 100 MCG/2ML IJ SOLN: 25.0000 ug | INTRAMUSCULAR | Status: DC | PRN

## 2018-03-15 MED ORDER — BUPIVACAINE HCL (PF) 0.75 % IJ SOLN
INTRAMUSCULAR | Status: AC
  Filled 2018-03-15: qty 10

## 2018-03-15 MED ORDER — SODIUM CHLORIDE 0.9 % IV SOLN
INTRAVENOUS | Status: DC
  Administered 2018-03-15 (×2): via INTRAVENOUS

## 2018-03-15 MED ORDER — LIDOCAINE HCL 2 % IJ SOLN
INTRAMUSCULAR | Status: DC | PRN
  Administered 2018-03-15: 6 mL via OPHTHALMIC

## 2018-03-15 MED ORDER — MIDAZOLAM HCL 2 MG/2ML IJ SOLN
INTRAMUSCULAR | Status: AC
  Filled 2018-03-15: qty 2

## 2018-03-15 MED ORDER — TOBRAMYCIN-DEXAMETHASONE 0.3-0.1 % OP OINT
TOPICAL_OINTMENT | OPHTHALMIC | Status: AC
  Filled 2018-03-15: qty 3.5

## 2018-03-15 MED ORDER — ONDANSETRON HCL 4 MG/2ML IJ SOLN
INTRAMUSCULAR | Status: AC
  Filled 2018-03-15: qty 2

## 2018-03-15 MED ORDER — SODIUM HYALURONATE 10 MG/ML IO SOLN
INTRAOCULAR | Status: AC
  Filled 2018-03-15: qty 0.85

## 2018-03-15 MED ORDER — BSS PLUS IO SOLN
INTRAOCULAR | Status: AC
  Filled 2018-03-15: qty 500

## 2018-03-15 MED ORDER — EPINEPHRINE PF 1 MG/ML IJ SOLN
INTRAOCULAR | Status: DC | PRN
  Administered 2018-03-15: 500 mL

## 2018-03-15 MED ORDER — MIDAZOLAM HCL 5 MG/5ML IJ SOLN
INTRAMUSCULAR | Status: DC | PRN
  Administered 2018-03-15: 2 mg via INTRAVENOUS

## 2018-03-15 MED ORDER — TETRACAINE HCL 0.5 % OP SOLN
OPHTHALMIC | Status: AC
  Filled 2018-03-15: qty 4

## 2018-03-15 MED ORDER — HYALURONIDASE HUMAN 150 UNIT/ML IJ SOLN
INTRAMUSCULAR | Status: AC
  Filled 2018-03-15: qty 1

## 2018-03-15 MED ORDER — STERILE WATER FOR INJECTION IJ SOLN
INTRAMUSCULAR | Status: DC | PRN
  Administered 2018-03-15: 200 mL

## 2018-03-15 MED ORDER — EPINEPHRINE PF 1 MG/ML IJ SOLN
INTRAMUSCULAR | Status: AC
  Filled 2018-03-15: qty 1

## 2018-03-15 MED ORDER — TOBRAMYCIN-DEXAMETHASONE 0.3-0.1 % OP OINT
TOPICAL_OINTMENT | OPHTHALMIC | Status: DC | PRN
  Administered 2018-03-15: 1 via OPHTHALMIC

## 2018-03-15 MED ORDER — TROPICAMIDE 1 % OP SOLN
1.0000 [drp] | OPHTHALMIC | Status: AC | PRN
  Administered 2018-03-15 (×3): 1 [drp] via OPHTHALMIC
  Filled 2018-03-15: qty 15

## 2018-03-15 MED ORDER — LIDOCAINE 2% (20 MG/ML) 5 ML SYRINGE
INTRAMUSCULAR | Status: DC | PRN
  Administered 2018-03-15: 60 mg via INTRAVENOUS

## 2018-03-15 MED ORDER — PROPARACAINE HCL 0.5 % OP SOLN
1.0000 [drp] | OPHTHALMIC | Status: AC | PRN
  Administered 2018-03-15 (×2): 1 [drp] via OPHTHALMIC

## 2018-03-15 MED ORDER — DEXAMETHASONE SODIUM PHOSPHATE 10 MG/ML IJ SOLN
INTRAMUSCULAR | Status: DC | PRN
  Administered 2018-03-15: 10 mg via INTRAMUSCULAR

## 2018-03-15 MED ORDER — LIDOCAINE HCL 2 % IJ SOLN
INTRAMUSCULAR | Status: AC
  Filled 2018-03-15: qty 20

## 2018-03-15 MED ORDER — HYDRALAZINE HCL 20 MG/ML IJ SOLN
INTRAMUSCULAR | Status: DC | PRN
  Administered 2018-03-15 (×2): 2.5 mg via INTRAVENOUS

## 2018-03-15 SURGICAL SUPPLY — 54 items
APPLICATOR COTTON TIP 6IN STRL (MISCELLANEOUS) ×2 IMPLANT
APPLICATOR DR MATTHEWS STRL (MISCELLANEOUS) ×2 IMPLANT
BLADE MINI 60D BLUE (BLADE) ×2 IMPLANT
BLADE MINI RND TIP GREEN BEAV (BLADE) IMPLANT
CANNULA ANT CHAM MAIN (OPHTHALMIC RELATED) IMPLANT
CANNULA DUALBORE 25G (CANNULA) ×2 IMPLANT
CAUTERY EYE LOW TEMP 1300F FIN (OPHTHALMIC RELATED) IMPLANT
CHANDELIER CONSTEL 25G RFID (OPHTHALMIC) ×1 IMPLANT
CORDS BIPOLAR (ELECTRODE) IMPLANT
COVER SURGICAL LIGHT HANDLE (MISCELLANEOUS) ×2 IMPLANT
COVER WAND RF STERILE (DRAPES) ×2 IMPLANT
FILTER BLUE MILLIPORE (MISCELLANEOUS) IMPLANT
FILTER STRAW FLUID ASPIR (MISCELLANEOUS) IMPLANT
FORCEPS GRIESHABER ILM 27G (INSTRUMENTS) ×1 IMPLANT
GAS AUTO FILL CONSTEL (OPHTHALMIC) ×2
GAS AUTO FILL CONSTELLATION (OPHTHALMIC) IMPLANT
GAS OPHTHALMIC (MISCELLANEOUS) ×1 IMPLANT
GLOVE ECLIPSE 7.5 STRL STRAW (GLOVE) ×2 IMPLANT
GOWN STRL REUS W/ TWL LRG LVL3 (GOWN DISPOSABLE) ×2 IMPLANT
GOWN STRL REUS W/TWL LRG LVL3 (GOWN DISPOSABLE) ×2
KIT BASIN OR (CUSTOM PROCEDURE TRAY) ×2 IMPLANT
KIT PERFLUORON PROCEDURE 5ML (MISCELLANEOUS) IMPLANT
KIT TURNOVER KIT B (KITS) ×2 IMPLANT
LENS BIOM SUPER VIEW SET DISP (OPHTHALMIC RELATED) ×1 IMPLANT
NDL 18GX1X1/2 (RX/OR ONLY) (NEEDLE) ×1 IMPLANT
NDL HYPO 25GX1X1/2 BEV (NEEDLE) IMPLANT
NDL HYPO 30X.5 LL (NEEDLE) ×2 IMPLANT
NEEDLE 18GX1X1/2 (RX/OR ONLY) (NEEDLE) ×2 IMPLANT
NEEDLE HYPO 25GX1X1/2 BEV (NEEDLE) IMPLANT
NEEDLE HYPO 30X.5 LL (NEEDLE) ×4 IMPLANT
NS IRRIG 1000ML POUR BTL (IV SOLUTION) ×2 IMPLANT
PACK VITRECTOMY CUSTOM (CUSTOM PROCEDURE TRAY) ×2 IMPLANT
PAD ARMBOARD 7.5X6 YLW CONV (MISCELLANEOUS) ×4 IMPLANT
PAK VITRECTOMY PIK 25 GA (OPHTHALMIC RELATED) IMPLANT
PIC ILLUMINATED 25G (OPHTHALMIC) ×2
PIK ILLUMINATED 25G (OPHTHALMIC) IMPLANT
REPL STRA BRUSH NDL (NEEDLE) IMPLANT
REPL STRA BRUSH NEEDLE (NEEDLE) IMPLANT
RESERVOIR BACK FLUSH (MISCELLANEOUS) IMPLANT
RETRACTOR IRIS FLEX 25G GRIESH (INSTRUMENTS) IMPLANT
ROLLS DENTAL (MISCELLANEOUS) IMPLANT
SET FLUID INJECTOR (SET/KITS/TRAYS/PACK) IMPLANT
SHEET MEDIUM DRAPE 40X70 STRL (DRAPES) ×2 IMPLANT
SOLUTION ANTI FOG 6CC (MISCELLANEOUS) ×2 IMPLANT
STOCKINETTE IMPERVIOUS 9X36 MD (GAUZE/BANDAGES/DRESSINGS) ×4 IMPLANT
STOPCOCK 4 WAY LG BORE MALE ST (IV SETS) IMPLANT
SUT ETHILON 5.0 S-24 (SUTURE) ×2 IMPLANT
SUT SILK 2 0 (SUTURE) ×1
SUT SILK 2-0 18XBRD TIE 12 (SUTURE) ×1 IMPLANT
SUT VICRYL 7 0 TG140 8 (SUTURE) IMPLANT
SYR 20CC LL (SYRINGE) IMPLANT
TOWEL OR 17X24 6PK STRL BLUE (TOWEL DISPOSABLE) ×4 IMPLANT
WATER STERILE IRR 1000ML POUR (IV SOLUTION) ×2 IMPLANT
WIPE INSTRUMENT VISIWIPE 73X73 (MISCELLANEOUS) IMPLANT

## 2018-03-15 NOTE — Op Note (Signed)
03/15/2018  5:03 PM  PATIENT:  Rachel Gonzalez  68 y.o. 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 Retrobulbar anesthesia was obtained in the left eye without complication  The  patient was prepped and draped in the usual fashion for ocular surgery on the  right eye .  A lid speculum was placed.  Infusion line and trocar was placed at the 4 o'clock position approximately 3.5 mm from the surgical limbus.   The infusion line was allowed to run and then clamped when placed at the cannula opening. The line was inserted and secured to the drape with an adhesive strip.   Active trocars/cannula were placed at the 10 and 2 o'clock positions approximately 3.5 mm from the surgical limbus. The cannula was visualized in the vitreous cavity.  The light pipe and vitreous cutter were inserted into the vitreous cavity and a core vitrectomy was performed.  Care taken to remove the vitreous up to the vitreous base for 360 degrees. A chandelier light source was placed at 6 o'clock.  Attention was directed toward relieving the tractional detachment from the posterior pole in particular surrounding the macular arcades and the contraction associated with the elevated fibrotic membrane using the vitrector, lighted pic, as well as ILM forceps. Removal of the traction was done  carefully at the disc and surrounding arcades.  ICG dye was injected over the macula to better highlight the tractional membranes.  There was notable neovascular fronds with traction and associated fibrosis at the disc and along the arcades. Care was taken to elevate the membranes and remove them both with a vitrector and a light pipe with dissection. Hemostasis of the neovascular fronds was performed with endocautery and endolaser. Following hemostasis, continued dissection of membranes and removal of membranes was performed including the temporal and inferior retina. Significant gliotic tissue was encountered along the inferotemporal and superotemporal arcades and was not safely able to be removed without inducing hemorrhage or breaks in the retina.  Endolaser was applied to the areas where the neovascular fronds were still present and in the nasal and inferior break created while performing membranectomy.   A complete air-fluid exchange was then performed and additional endolaser was applied. 12% C3F8 gas was injected into the eye. The trocars were sequentially removed and all were noted to be airtight. Subconjunctival injections of Ancef and Decadron were placed.   The speculum and drapes were removed and the eye was patched with Polymixin/Bacitracin ophthalmic ointment. An eye shield was placed and the patient was transferred alert and conversant with stable vital signs to the post operative recovery area.  The patient tolerated the procedure well and no complications were noted.

## 2018-03-15 NOTE — Anesthesia Postprocedure Evaluation (Signed)
Anesthesia Post Note  Patient: Rachel Gonzalez  Procedure(s) Performed: RIGHT EYE COMPLEX RETINA DETACHMENT VITRECTOMY MEMBRANE PEEL WITH AIR,GAS,SILICONE OIL PHOTOCOAGULATION (Right Eye) PHOTOCOAGULATION WITH LASER (Right Eye) MEMBRANE PEEL (Right Eye)     Patient location during evaluation: PACU Anesthesia Type: MAC Level of consciousness: awake and alert Pain management: pain level controlled Vital Signs Assessment: post-procedure vital signs reviewed and stable Respiratory status: spontaneous breathing, nonlabored ventilation, respiratory function stable and patient connected to nasal cannula oxygen Cardiovascular status: stable and blood pressure returned to baseline Postop Assessment: no apparent nausea or vomiting Anesthetic complications: no    Last Vitals:  Vitals:   03/15/18 1747 03/15/18 1814  BP: 122/72 118/69  Pulse: 64 66  Resp: 11 12  Temp: 36.5 C 36.5 C  SpO2: 95% 93%    Last Pain:  Vitals:   03/15/18 1747  TempSrc:   PainSc: Asleep                 Ryan P Ellender

## 2018-03-15 NOTE — Anesthesia Preprocedure Evaluation (Addendum)
Anesthesia Evaluation  Patient identified by MRN, date of birth, ID band Patient awake    Reviewed: Allergy & Precautions, NPO status , Patient's Chart, lab work & pertinent test results  Airway Mallampati: III  TM Distance: >3 FB Neck ROM: Full    Dental no notable dental hx. (+) Dental Advisory Given, Teeth Intact   Pulmonary neg pulmonary ROS,    Pulmonary exam normal breath sounds clear to auscultation       Cardiovascular hypertension, Pt. on medications Normal cardiovascular exam Rhythm:Regular Rate:Normal  ECG: NSR, rate 68   Neuro/Psych negative neurological ROS  negative psych ROS   GI/Hepatic Neg liver ROS, GERD  Medicated and Controlled,  Endo/Other  diabetes, Insulin Dependent  Renal/GU Renal InsufficiencyRenal disease     Musculoskeletal negative musculoskeletal ROS (+)   Abdominal (+) + obese,   Peds  Hematology  (+) anemia ,   Anesthesia Other Findings PROLIFERATIVE DIABETIC RETINOPATHY WITH TRACTION RETINAL DETACHMENT INVOLVONG THE MACULA ,RIGHT EYE  Reproductive/Obstetrics                           Anesthesia Physical Anesthesia Plan  ASA: III  Anesthesia Plan: MAC   Post-op Pain Management:    Induction: Intravenous  PONV Risk Score and Plan: 2 and Midazolam, Ondansetron, Treatment may vary due to age or medical condition and Propofol infusion  Airway Management Planned: Nasal Cannula  Additional Equipment:   Intra-op Plan:   Post-operative Plan:   Informed Consent: I have reviewed the patients History and Physical, chart, labs and discussed the procedure including the risks, benefits and alternatives for the proposed anesthesia with the patient or authorized representative who has indicated his/her understanding and acceptance.   Dental advisory given  Plan Discussed with: CRNA  Anesthesia Plan Comments:         Anesthesia Quick Evaluation

## 2018-03-15 NOTE — Discharge Instructions (Addendum)
DO NOT SLEEP ON BACK, THE EYE PRESSURE CAN GO UP AND CAUSE VISION LOSS   SLEEP ON SIDE WITH NOSE TO PILLOW  DURING DAY KEEP FACE DOWN.  15 MINUTES EVERY 2 HOURS IT IS OK TO LOOK STRAIGHT AHEAD (USE BATHROOM, EAT, WALK, ETC.)  

## 2018-03-15 NOTE — Anesthesia Procedure Notes (Signed)
Procedure Name: MAC Date/Time: 03/15/2018 3:10 PM Performed by: Jenne Campus, CRNA Pre-anesthesia Checklist: Patient identified, Emergency Drugs available, Suction available and Patient being monitored Oxygen Delivery Method: Nasal cannula

## 2018-03-15 NOTE — Brief Op Note (Signed)
03/15/2018  5:03 PM  PATIENT:  [name]  68 y.o. 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 ,RIGHT EYE  PROCEDURE:  Procedure(s): RIGHT EYE COMPLEX RETINA DETACHMENT VITRECTOMY MEMBRANE PEEL WITH AIR,GAS, PHOTOCOAGULATION (Right)  SURGEON:  Surgeon(s) and Role:    * Jalene Mullet, MD - Primary  PHYSICIAN ASSISTANT:   ASSISTANTS: none   ANESTHESIA:   local and MAC  EBL:  1 mL   BLOOD ADMINISTERED:none  DRAINS: none   LOCAL MEDICATIONS USED:  MARCAINE    and LIDOCAINE   SPECIMEN:  No Specimen  DISPOSITION OF SPECIMEN:  N/A  COUNTS:  YES  TOURNIQUET:  * No tourniquets in log *  DICTATION: .Note written in EPIC  PLAN OF CARE: Discharge to home after PACU  PATIENT DISPOSITION:  PACU - hemodynamically stable.   Delay start of Pharmacological VTE agent (>24hrs) due to surgical blood loss or risk of bleeding: not applicable

## 2018-03-15 NOTE — Transfer of Care (Signed)
Immediate Anesthesia Transfer of Care Note  Patient: Rachel Gonzalez  Procedure(s) Performed: RIGHT EYE COMPLEX RETINA DETACHMENT VITRECTOMY MEMBRANE PEEL WITH AIR,GAS,SILICONE OIL PHOTOCOAGULATION (Right Eye) PHOTOCOAGULATION WITH LASER (Right Eye) MEMBRANE PEEL (Right Eye)  Patient Location: ICU  Anesthesia Type:MAC  Level of Consciousness: awake, alert  and oriented  Airway & Oxygen Therapy: Patient Spontanous Breathing and Patient connected to nasal cannula oxygen  Post-op Assessment: Report given to RN and Post -op Vital signs reviewed and stable  Post vital signs: Reviewed and stable  Last Vitals:  Vitals Value Taken Time  BP 154/101 03/15/2018  5:04 PM  Temp    Pulse 74 03/15/2018  5:04 PM  Resp 8 03/15/2018  5:04 PM  SpO2 98 % 03/15/2018  5:04 PM  Vitals shown include unvalidated device data.  Last Pain:  Vitals:   03/15/18 1141  TempSrc:   PainSc: 8       Patients Stated Pain Goal: 2 (54/62/70 3500)  Complications: No apparent anesthesia complications

## 2018-03-16 ENCOUNTER — Encounter (HOSPITAL_COMMUNITY): Payer: Self-pay | Admitting: Ophthalmology

## 2018-03-24 ENCOUNTER — Telehealth: Payer: Self-pay

## 2018-03-24 DIAGNOSIS — G8929 Other chronic pain: Secondary | ICD-10-CM

## 2018-03-24 DIAGNOSIS — M25511 Pain in right shoulder: Principal | ICD-10-CM

## 2018-03-24 MED ORDER — ACETAMINOPHEN-CODEINE #3 300-30 MG PO TABS: ORAL_TABLET | ORAL | 0 refills | Status: DC

## 2018-03-24 NOTE — Telephone Encounter (Signed)
Reviewed [place] Substance Reporting system prior to prescribing opiate medications. No inconsistencies noted.   

## 2018-04-05 ENCOUNTER — Ambulatory Visit: Payer: Medicare HMO | Admitting: Podiatry

## 2018-04-06 ENCOUNTER — Ambulatory Visit: Payer: Medicare Other | Admitting: *Deleted

## 2018-04-06 ENCOUNTER — Encounter: Payer: Medicare HMO | Attending: Family Medicine | Admitting: Registered"

## 2018-04-06 ENCOUNTER — Encounter: Payer: Self-pay | Admitting: Registered"

## 2018-04-06 DIAGNOSIS — E1165 Type 2 diabetes mellitus with hyperglycemia: Secondary | ICD-10-CM

## 2018-04-06 NOTE — Progress Notes (Signed)
Diabetes Self-Management Education  Visit Type: First/Initial  Appt. Start Time: 1525 Appt. End Time: 1620  04/06/2018  [name], identified by name and date of birth, is a 68 y.o. female with a diagnosis of Diabetes: Type 2.   ASSESSMENT  There were no vitals taken for this visit. (patient declined) There is no height or weight on file to calculate BMI.   Patient was brought to appointment by her husband who decided to wait in lobby instead of coming in for her appointment. Pt states she wants to know how to eat healthy to help control her blood sugar.  Patient states she had procedure on her eyes 3 weeks ago and her vision has not improved, can only see close up. Pt states she could only see the color of RD's sweater and could not see RD's face across the desk.   Pt states she stopped checking her BG because her meter broke 2 yrs ago. RD saw Rx in her chart for a new meter. RD provided a starter kit and patient demonstrated ability to use it even with diminished eye sight. Pt seemed a little surprised when the reading showed 406 mg/dL. RD provided patient with water and encouraged patient to always check blood sugar if she thinks it is low and not use soda to feel better.   Pt states that her insulin needle bends sometimes and was wondering if she is not getting insulin when that happens. Pt also states she immediately withdraws needle after injection.  When RD suggested diet soda, pt was resistant and states she has not like the ones she has tried. When patient saw the MyPlate graphic she asked where is the dessert. Pt reports frequent fruit intake for snacks and was receptive to including protein with these snacks.  Sleep: 8-9 hrs per night. Energy level - sometimes good, sometimes not. Pt states now energy is good, patient appeared sleepy to RD, but is not familiar with patient or her usual affect.  Pt reports this morning was tired when she woke up, ate breakfast around 10  started feeling woosy/sluggish 1:30 and drank mellow yellow, ate chips and strawberries (no lunch) and felt more energized.  Meter provided: [employer] (from [employer] for [employer] preferred meter) Lot #: [id number] X Exp: 11/07/18 BG: 406 mg/dL  Diabetes Self-Management Education - 04/06/18 1533      Visit Information   Visit Type  First/Initial      Initial Visit   Diabetes Type  Type 2    Are you currently following a meal plan?  No    Are you taking your medications as prescribed?  Yes   insulin 45 units   Date Diagnosed  1989      Health Coping   How would you rate your overall health?  Fair      Psychosocial Assessment   How often do you need to have someone help you when you read instructions, pamphlets, or other written materials from your doctor or pharmacy?  1 - Never    What is the last grade level you completed in school?  10      Complications   Last HgB A1C per patient/outside source  8.3 %    How often do you check your blood sugar?  0 times/day (not testing)    Have you had a dilated eye exam in the past 12 months?  Yes    Have you had a dental exam in the past 12 months?  Yes    Are you checking your feet?  Yes    How many days per week are you checking your feet?  2      Dietary Intake   Breakfast  coffee, flavored creamer and grits, eggs, bacon OR cereal OR waffle, sugar-free syrup, sausage    Snack (morning)  fruit    Lunch  sandwich, ham OR bologna OR salad, eggs, cheese, almonds    Snack (afternoon)  none    Dinner  "whatever I get" rice, meat, baked potato, coleslaw OR ribs, sweet potato OR bbq chicken, rice, salad    Snack (evening)  ginger ale, cookies OR salad OR chips (no salt) OR orange    Beverage(s)  coffee, ginger ale (regular), mellow Yellow (reg), sweet tea with supper      Exercise   Exercise Type  ADL's    How many days per week to you exercise?  0    How many minutes per day do you exercise?  0    Total minutes per week of  exercise  0      Patient Education   Previous Diabetes Education  Yes (please comment)   attended a class a long time ago   Nutrition management   Role of diet in the treatment of diabetes and the relationship between the three main macronutrients and blood glucose level    Medications  Taught/reviewed insulin injection, site rotation, insulin storage and needle disposal.    Monitoring  Identified appropriate SMBG and/or A1C goals.    Acute complications  Taught treatment of hypoglycemia - the 15 rule.      Individualized Goals (developed by patient)   Nutrition  General guidelines for healthy choices and portions discussed    Medications  take my medication as prescribed    Monitoring   test my blood glucose as discussed      Outcomes   Expected Outcomes  Demonstrated interest in learning. Expect positive outcomes    Future DMSE  4-6 wks    Program Status  Not Completed      Individualized Plan for Diabetes Self-Management Training:   Learning Objective:  Patient will have a greater understanding of diabetes self-management. Patient education plan is to attend individual and/or group sessions per assessed needs and concerns.  Patient Instructions  Remember to leave the insulin needle injected into your skin for a count of 10. Contact your doctor about more specific questions on technique and question about needle bending Contact your doctor about your high blood sugar (during visit today it was 406 mg/dL) Consider checking your blood sugar when you feel sluggish, if high (over 250) drink plenty of water Consider cutting back regular soda and sweet tea and only drink with meals if you feel you need it. Aim to eat balanced meals and snacks. Eating protein with fruit is a good idea - nuts or cheese are options   Expected Outcomes:  Demonstrated interest in learning. Expect positive outcomes  Education material provided: A1C conversion sheet, Snack sheet and Support group flyer,    If problems or questions, patient to contact team via:  Phone  Future DSME appointment: 4-6 wks

## 2018-04-06 NOTE — Patient Instructions (Addendum)
Remember to leave the insulin needle injected into your skin for a count of 10. Contact your doctor about more specific questions on technique and question about needle bending Contact your doctor about your high blood sugar (during visit today it was 406 mg/dL) Consider checking your blood sugar when you feel sluggish, if high (over 250) drink plenty of water Consider cutting back regular soda and sweet tea and only drink with meals if you feel you need it. Aim to eat balanced meals and snacks. Eating protein with fruit is a good idea - nuts or cheese are options

## 2018-04-08 ENCOUNTER — Telehealth: Payer: Self-pay

## 2018-04-08 NOTE — Telephone Encounter (Signed)
I have called [facility] and left a message to follow up on this

## 2018-04-08 NOTE — Telephone Encounter (Signed)
Patient is asking for a referral for diabetic shoes. Can she be referred to podiatry.

## 2018-04-08 NOTE — Telephone Encounter (Signed)
I think she has already been referred. He recommended them. Please call [facility] to see what they need done for her to get them.

## 2018-04-11 ENCOUNTER — Telehealth: Payer: Self-pay | Admitting: Podiatry

## 2018-04-11 NOTE — Telephone Encounter (Signed)
I explained to Rachel Gonzalez @ pts pcp  that pt has to be seen by a md/do so that they can sign the diabetic shoe paperwork per medicare.She is currently seen by a NP. She was originally scheduled to see Dr Virgilio Frees but canceled and no showed for those appts.  If they will just let me know what MD pt is going to be seen by and I will send the paperwork over for them to sign off on.

## 2018-04-11 NOTE — Telephone Encounter (Signed)
Dawn from [facility] called today and advised that when she last spoke with Patient she was to see Dr. Lonzo Cloud with Endocrinology and [facility] had sent them paper work to fill out for this. Paper work has to be Done by a MD or DO. She no showed the appointment in December with dr. Lonzo Cloud. When ever she sees an MD/DO they [facility] will take care of paper work and getting her the shoes.   Called and left a message for Ms. Butt to call back so I can explain this to her.

## 2018-04-13 ENCOUNTER — Ambulatory Visit: Payer: Medicare Other | Admitting: Family Medicine

## 2018-04-28 ENCOUNTER — Other Ambulatory Visit: Payer: Self-pay | Admitting: Family Medicine

## 2018-04-28 DIAGNOSIS — G8929 Other chronic pain: Secondary | ICD-10-CM

## 2018-04-28 DIAGNOSIS — M25511 Pain in right shoulder: Principal | ICD-10-CM

## 2018-04-28 NOTE — Telephone Encounter (Signed)
Refill request for tylenol #3. Please advise.  

## 2018-05-02 ENCOUNTER — Telehealth: Payer: Self-pay

## 2018-05-02 NOTE — Telephone Encounter (Signed)
Received a fax from the pharamcy. Insurance will not cover Lantus but suggest to try Mariella Saa, Evaristo Bury, or Levemir. Please advise. Thanks!

## 2018-05-03 ENCOUNTER — Ambulatory Visit: Payer: Medicare HMO | Admitting: Registered"

## 2018-05-04 NOTE — Telephone Encounter (Signed)
Patient is scheduling an appointment

## 2018-05-04 NOTE — Telephone Encounter (Signed)
Please have her come in for a follow up. She cancelled her last visit and the nutrition visit. I need to know what her food and CBGs are prior to making a change. Thanks

## 2018-05-09 ENCOUNTER — Other Ambulatory Visit: Payer: Self-pay | Admitting: Family Medicine

## 2018-05-09 DIAGNOSIS — E1165 Type 2 diabetes mellitus with hyperglycemia: Secondary | ICD-10-CM

## 2018-05-09 MED ORDER — INSULIN DEGLUDEC 200 UNIT/ML ~~LOC~~ SOPN: 45.0000 [IU] | PEN_INJECTOR | Freq: Every day | SUBCUTANEOUS | 11 refills | Status: DC

## 2018-05-09 NOTE — Progress Notes (Signed)
Insurance requesting change from lantus to tresiba.

## 2018-05-11 ENCOUNTER — Encounter: Payer: Self-pay | Admitting: Family Medicine

## 2018-05-11 ENCOUNTER — Ambulatory Visit (INDEPENDENT_AMBULATORY_CARE_PROVIDER_SITE_OTHER): Payer: Medicare HMO | Admitting: Family Medicine

## 2018-05-11 VITALS — BP 169/81 | HR 75 | Resp 14 | Ht 64.0 in | Wt 226.8 lb

## 2018-05-11 DIAGNOSIS — Z91199 Patient's noncompliance with other medical treatment and regimen due to unspecified reason: Secondary | ICD-10-CM

## 2018-05-11 DIAGNOSIS — J309 Allergic rhinitis, unspecified: Secondary | ICD-10-CM | POA: Diagnosis not present

## 2018-05-11 DIAGNOSIS — L304 Erythema intertrigo: Secondary | ICD-10-CM

## 2018-05-11 DIAGNOSIS — E1165 Type 2 diabetes mellitus with hyperglycemia: Secondary | ICD-10-CM | POA: Diagnosis not present

## 2018-05-11 DIAGNOSIS — Z9119 Patient's noncompliance with other medical treatment and regimen: Secondary | ICD-10-CM | POA: Diagnosis not present

## 2018-05-11 LAB — GLUCOSE, POCT (MANUAL RESULT ENTRY): POC Glucose: 250 mg/dl — AB (ref 70–99)

## 2018-05-11 LAB — POCT GLYCOSYLATED HEMOGLOBIN (HGB A1C): Hemoglobin A1C: 13.1 % — AB (ref 4.0–5.6)

## 2018-05-11 MED ORDER — LEVOCETIRIZINE DIHYDROCHLORIDE 5 MG PO TABS: ORAL_TABLET | ORAL | 11 refills | Status: DC

## 2018-05-11 MED ORDER — NYSTATIN 100000 UNIT/GM EX POWD: Freq: Four times a day (QID) | CUTANEOUS | 5 refills | Status: DC

## 2018-05-11 MED ORDER — NYSTATIN 100000 UNIT/GM EX OINT: 1.0000 "application " | TOPICAL_OINTMENT | Freq: Two times a day (BID) | CUTANEOUS | 0 refills | Status: DC

## 2018-05-11 MED ORDER — INSULIN DEGLUDEC 200 UNIT/ML ~~LOC~~ SOPN: 45.0000 [IU] | PEN_INJECTOR | Freq: Every day | SUBCUTANEOUS | 11 refills | Status: DC

## 2018-05-11 NOTE — Patient Instructions (Signed)
Insulin Degludec injection  What is this medicine?  INSULIN DEGLUDEC (IN su lin de GLOO dek) is a human-made form of insulin. This drug lowers the amount of sugar in your blood. It is a long-acting insulin that is usually given once a day.  This medicine may be used for other purposes; ask your health care provider or pharmacist if you have questions.  COMMON BRAND NAME(S): Tresiba  What should I tell my health care provider before I take this medicine?  They need to know if you have any of these conditions:  -episodes of low blood sugar  -eye disease, vision problems  -kidney disease  -liver disease  -an unusual or allergic reaction to insulin, other medicines, foods, dyes, or preservatives  -pregnant or trying to get pregnant  -breast-feeding  How should I use this medicine?  This medicine is for injection under the skin. Use exactly as directed. This insulin should never be mixed in the same syringe with other insulins before injection. Do not vigorously shake before use. You will be taught how to use this medicine and how to adjust doses for activities and illness. Do not use more insulin than prescribed.  Always check the appearance of your insulin before using it. This medicine should be clear and colorless like water. Do not use it if it is cloudy, thickened, colored, or has solid particles in it.  If you use an insulin pen, be sure to take off the outer needle cover before using the dose.  It is important that you put your used needles and syringes in a special sharps container. Do not put them in a trash can. If you do not have a sharps container, call your pharmacist or healthcare provider to get one.  Talk to your pediatrician regarding the use of this medicine in children. While this drug may be prescribed for children as young as 1 year for selected conditions, precautions do apply.  Overdosage: If you think you have taken too much of this medicine contact a poison control center or emergency room at  once.  NOTE: This medicine is only for you. Do not share this medicine with others.  What if I miss a dose?  For adults: If you miss a dose, take it as soon as you can. Make sure your next dose is taken at least 8 hours later. Do not take double or extra doses.  For adolescents and children: It is important not to miss a dose. Your health care professional or doctor should discuss a plan for missed doses with you. If you do miss a dose, follow their plan. Do not take double doses.  What may interact with this medicine?  -other medicines for diabetes  Many medications may cause changes in blood sugar, these include:  -alcohol containing beverages  -antiviral medicines for HIV or AIDS  -aspirin and aspirin-like drugs  -certain medicines for blood pressure, heart disease, irregular heart beat  -chromium  -diuretics  -female hormones, such as estrogens or progestins, birth control pills  -fenofibrate  -gemfibrozil  -isoniazid  -lanreotide  -female hormones or anabolic steroids  -MAOIs like Carbex, Eldepryl, Marplan, Nardil, and Parnate  -medicines for weight loss  -medicines for allergies, asthma, cold, or cough  -medicines for depression, anxiety, or psychotic disturbances  -niacin  -nicotine  -NSAIDs, medicines for pain and inflammation, like ibuprofen or naproxen  -octreotide  -pasireotide  -pentamidine  -phenytoin  -probenecid  -quinolone antibiotics such as ciprofloxacin, levofloxacin, ofloxacin  -some herbal dietary   supplements  -steroid medicines such as prednisone or cortisone  -sulfamethoxazole; trimethoprim  -thyroid hormones  Some medications can hide the warning symptoms of low blood sugar (hypoglycemia). You may need to monitor your blood sugar more closely if you are taking one of these medications. These include:  -beta-blockers, often used for high blood pressure or heart problems (examples include atenolol, metoprolol, propranolol)  -clonidine  -guanethidine  -reserpine  This list may not describe all  possible interactions. Give your health care provider a list of all the medicines, herbs, non-prescription drugs, or dietary supplements you use. Also tell them if you smoke, drink alcohol, or use illegal drugs. Some items may interact with your medicine.  What should I watch for while using this medicine?  Visit your health care professional or doctor for regular checks on your progress.  A test called the HbA1C (A1C) will be monitored. This is a simple blood test. It measures your blood sugar control over the last 2 to 3 months. You will receive this test every 3 to 6 months.  Learn how to check your blood sugar. Learn the symptoms of low and high blood sugar and how to manage them.  Always carry a quick-source of sugar with you in case you have symptoms of low blood sugar. Examples include hard sugar candy or glucose tablets. Make sure others know that you can choke if you eat or drink when you develop serious symptoms of low blood sugar, such as seizures or unconsciousness. They must get medical help at once.  Tell your doctor or health care professional if you have high blood sugar. You might need to change the dose of your medicine. If you are sick or exercising more than usual, you might need to change the dose of your medicine.  Do not skip meals. Ask your doctor or health care professional if you should avoid alcohol. Many nonprescription cough and cold products contain sugar or alcohol. These can affect blood sugar.  Make sure that you have the right kind of syringe for the type of insulin you use. Try not to change the brand and type of insulin or syringe unless your health care professional or doctor tells you to. Switching insulin brand or type can cause dangerously high or low blood sugar. Always keep an extra supply of insulin, syringes, and needles on hand. Use a syringe one time only. Throw away syringe and needle in a closed container to prevent accidental needle sticks.  Insulin pens and  cartridges should never be shared. Even if the needle is changed, sharing may result in passing of viruses like hepatitis or HIV.  Each time you get a new box of pen needles, check to see if they are the same type as the ones you were trained to use. If not, ask your health care professional to show you how to use this new type properly.  Wear a medical ID bracelet or chain, and carry a card that describes your disease and details of your medicine and dosage times.  What side effects may I notice from receiving this medicine?  Side effects that you should report to your doctor or health care professional as soon as possible:  -allergic reactions like skin rash, itching or hives, swelling of the face, lips, or tongue  -breathing problems  -signs and symptoms of high blood sugar such as dizziness, dry mouth, dry skin, fruity breath, nausea, stomach pain, increased hunger or thirst, increased urination  -signs and symptoms of low blood   sugar such as feeling anxious, confusion, dizziness, increased hunger, unusually weak or tired, sweating, shakiness, cold, irritable, headache, blurred vision, fast heartbeat, loss of consciousness  Side effects that usually do not require medical attention (report to your doctor or health care professional if they continue or are bothersome):  -increase or decrease in fatty tissue under the skin due to overuse of a particular injection site  -itching, burning, swelling, or rash at site where injected  This list may not describe all possible side effects. Call your doctor for medical advice about side effects. You may report side effects to FDA at 1-800-FDA-1088.  Where should I keep my medicine?  Keep out of the reach of children.  Store unopened pen in a refrigerator between 2 and 8 degrees C (36 and 46 degrees F). Do not freeze or store directly next to the refrigerator cooling element. Do not use if the insulin if it has been frozen. Store an in-use pen in a refrigerator between 2  and 8 degrees C (36 and 46 degrees F) or at room temperature, below 30 degrees C (86 degrees F). The in-use pen may be used for up to 56 days (8 weeks) after being opened, if it is refrigerated or kept at room temperature.  Protect from light and excessive heat. Throw away any unused medicine after the expiration date or after the specified time for room temperature storage has passed.  NOTE: This sheet is a summary. It may not cover all possible information. If you have questions about this medicine, talk to your doctor, pharmacist, or health care provider.   2019 Elsevier/Gold Standard (2017-08-26 14:56:35)

## 2018-05-11 NOTE — Progress Notes (Signed)
[facility]   Progress Note: General Provider: Mike Gip, FNP  SUBJECTIVE:   Rachel Gonzalez is a 68 y.o. female who  has a past medical history of Anemia, Arthritis, Diabetes mellitus, GERD (gastroesophageal reflux disease), Hypertension, and Renal disorder.. Patient presents today for Follow-up  Since the last visit, patient had eye surgery and is being followed by Dr. Allena Katz. Patient states that she is waiting for approval diabetic shoes with a prosthetic right great toe. States that she has been seen by [facility] for this. Patient states that she has been told that she needs "approval" from her PCP.  Patient is not following a carb modified diet. She is not exercising. She does not check her blood sugars. She states that she is eating a lot of fried chicken and bread.    Review of Systems  Constitutional: Negative.   HENT: Negative.   Eyes: Negative.   Respiratory: Negative.   Cardiovascular: Negative.   Gastrointestinal: Negative.   Genitourinary: Negative.   Musculoskeletal: Negative.   Skin: Negative.   Neurological: Negative.   Psychiatric/Behavioral: Negative.      OBJECTIVE: BP (!) 169/81 (BP Location: Right Arm, Patient Position: Sitting) Comment: checked both arms  Pulse 75   Resp 14   Ht 5\' 4"  (1.626 m)   Wt 226 lb 12.8 oz (102.9 kg)   SpO2 99%   BMI 38.93 kg/m   Wt Readings from Last 3 Encounters:  05/11/18 226 lb 12.8 oz (102.9 kg)  03/15/18 200 lb (90.7 kg)  02/16/18 230 lb (104.3 kg)     Physical Exam Vitals signs and nursing note reviewed.  Constitutional:      General: She is not in acute distress.    Appearance: She is well-developed.  HENT:     Head: Normocephalic and atraumatic.  Eyes:     Conjunctiva/sclera: Conjunctivae normal.     Pupils: Pupils are equal, round, and reactive to light.  Neck:     Musculoskeletal: Normal range of motion.  Cardiovascular:     Rate and Rhythm:  Normal rate and regular rhythm.     Heart sounds: Normal heart sounds.  Pulmonary:     Effort: Pulmonary effort is normal. No respiratory distress.     Breath sounds: Normal breath sounds.  Abdominal:     General: Bowel sounds are normal. There is no distension.     Palpations: Abdomen is soft.  Musculoskeletal: Normal range of motion.  Skin:    General: Skin is warm and dry.  Neurological:     Mental Status: She is alert and oriented to person, place, and time.  Psychiatric:        Mood and Affect: Mood normal.        Behavior: Behavior normal.        Thought Content: Thought content normal.        Judgment: Judgment normal.     ASSESSMENT/PLAN:   1. Uncontrolled type 2 diabetes mellitus with hyperglycemia (HCC) A1c has increased to 13.1. Patient is non compliant with her diabetes management and follow up appts. Discussed this with patient today. Insulin changed due to insurance request.  - POCT glycosylated hemoglobin (Hb A1C) - POCT glucose (manual entry) - Insulin Degludec (TRESIBA FLEXTOUCH) 200 UNIT/ML SOPN; Inject 46 Units into the skin daily.  Dispense: 5 pen; Refill: 11  2. Allergic rhinitis, unspecified seasonality, unspecified trigger - levocetirizine (XYZAL) 5 MG tablet; TAKE ONE TABLET BY MOUTH ONCE DAILY  IN THE EVENING  Dispense: 30 tablet; Refill: 11  3. Intertrigo Discussed that this is most likely due to increased A1c.  - nystatin (NYSTATIN) powder; Apply topically 4 (four) times daily.  Dispense: 15 g; Refill: 5 - nystatin ointment (MYCOSTATIN); Apply 1 application topically 2 (two) times daily.  Dispense: 30 g; Refill: 0 4. Non-compliance  Return in about 4 weeks (around 06/08/2018) for DM.    The patient was given clear instructions to go to ER or return to medical center if symptoms do not improve, worsen or new problems develop. The patient verbalized understanding and agreed with plan of care.   [name]. Rachel Lam, FNP-BC [facility] [address] [place], [name] [address] [phone]

## 2018-05-16 ENCOUNTER — Telehealth: Payer: Self-pay

## 2018-05-16 NOTE — Telephone Encounter (Signed)
Debby Bud, I am not familiar with this. Can you please advise? Thank you

## 2018-05-16 NOTE — Telephone Encounter (Signed)
Spoke with patient who states that she received a letter from the [facility] that stated that she is legally blind and is not able to drive. I am unsure as to what she is referring to. I instructed her to have them fax a copy of the letter to [place]. Patient states that she is being told that she will have to pay over $400 to get her license. Informed her to have the letter sent to our office. The patient verbalized understanding and agreed to do so.

## 2018-05-27 ENCOUNTER — Telehealth: Payer: Self-pay

## 2018-05-27 NOTE — Telephone Encounter (Signed)
Left a vm for patient

## 2018-05-27 NOTE — Telephone Encounter (Signed)
I did not send anything regarding her inability to drive to the [facility]. If they can provide a copy of what was sent, I will take a look at it. Until then, I cannot help

## 2018-05-27 NOTE — Telephone Encounter (Signed)
Patient states that the [facility] is unable to send the paperwork to [place] and that they just need a letter stating that you didn't check the box for therapy. Please advise

## 2018-05-30 DIAGNOSIS — E113512 Type 2 diabetes mellitus with proliferative diabetic retinopathy with macular edema, left eye: Secondary | ICD-10-CM | POA: Diagnosis not present

## 2018-05-31 ENCOUNTER — Other Ambulatory Visit: Payer: Self-pay | Admitting: Family Medicine

## 2018-05-31 DIAGNOSIS — I10 Essential (primary) hypertension: Secondary | ICD-10-CM

## 2018-05-31 DIAGNOSIS — G8929 Other chronic pain: Secondary | ICD-10-CM

## 2018-05-31 DIAGNOSIS — E119 Type 2 diabetes mellitus without complications: Secondary | ICD-10-CM

## 2018-05-31 DIAGNOSIS — M25511 Pain in right shoulder: Principal | ICD-10-CM

## 2018-05-31 DIAGNOSIS — Z794 Long term (current) use of insulin: Secondary | ICD-10-CM

## 2018-05-31 DIAGNOSIS — IMO0001 Reserved for inherently not codable concepts without codable children: Secondary | ICD-10-CM

## 2018-05-31 NOTE — Telephone Encounter (Signed)
Patient would like a call directly from provider. Patient is aware that you are out of office until tomorrow. Patient was advise that you need the paper of what was sent to [facility].

## 2018-06-01 NOTE — Telephone Encounter (Signed)
Left a detailed vm for patient.

## 2018-06-01 NOTE — Telephone Encounter (Signed)
Patient has schedule a phone visit with Debby Bud on 06/07/2018.

## 2018-06-03 ENCOUNTER — Other Ambulatory Visit: Payer: Self-pay

## 2018-06-03 ENCOUNTER — Ambulatory Visit (INDEPENDENT_AMBULATORY_CARE_PROVIDER_SITE_OTHER): Payer: Medicare HMO | Admitting: Family Medicine

## 2018-06-03 ENCOUNTER — Encounter: Payer: Self-pay | Admitting: Family Medicine

## 2018-06-03 DIAGNOSIS — R29898 Other symptoms and signs involving the musculoskeletal system: Secondary | ICD-10-CM

## 2018-06-03 DIAGNOSIS — N183 Chronic kidney disease, stage 3 unspecified: Secondary | ICD-10-CM

## 2018-06-03 DIAGNOSIS — R296 Repeated falls: Secondary | ICD-10-CM | POA: Diagnosis not present

## 2018-06-03 DIAGNOSIS — E1165 Type 2 diabetes mellitus with hyperglycemia: Secondary | ICD-10-CM

## 2018-06-03 NOTE — Progress Notes (Signed)
  [facility]  Virtual Visit via Telephone Note  I connected with Rachel Gonzalez on 06/03/18 at  1:00 PM EDT by telephone and verified that I am speaking with the correct person using two identifiers.   I discussed the limitations, risks, security and privacy concerns of performing an evaluation and management service by telephone and the availability of in person appointments. I also discussed with the patient that there may be a patient responsible charge related to this service. The patient expressed understanding and agreed to proceed.  Patient would like her sister, Rachel Gonzalez. With Rachel Gonzalez permission, sister was added to the Gonzalez.  History of Present Illness: Patient is calling due to receiving a form from the [facility] stating that she has to have an evaluation from occupational therapy. She states that she called about setting up this appointment and was told that this would cost over $300. She does not have a copy of the form. There is not any information on this in her chart.  Patient and sister states that the [facility] will not release a copy of the paperwork to them. They report calling to the state headquarters in [place]. They state that the form was filled out during her august 2019 appt.     Observations/Objective: Patient with regular voice tone, rate and rhythm. Speaking calmly and is in no apparent distress.   Assessment and Plan: 1. Uncontrolled type 2 diabetes mellitus with hyperglycemia (HCC) 2. Stage 3 chronic kidney disease (HCC) 3. Frequent falls 4. Right arm weakness  Patient has multiple comorbidities including uncontrolled DM 2 with her last A1c being 13.1 on 05/11/2018. She has a history of frequent falls,  partial amputation of the right foot, retinal detachment repair. This provider is unsure as to who has limited her ability to drive. However, it is  my current recommendation that she does not drive due to  her co-morbidites, specifically uncontrolled diabetes. Patient also is taking sedating medications that she is unwilling to discontinue such as gabapentin, tylenol with codeine and xyzal, She is also insulin dependent.  We discussed that I cannot reverse the decision enforced by the [facility]. I am not familiar with the paperwork to which she is referring.   Follow Up Instructions:    I discussed the assessment and treatment plan with the patient. The patient was provided an opportunity to ask questions and all were answered. The patient agreed with the plan and demonstrated an understanding of the instructions.   The patient was advised to Gonzalez back or seek an in-person evaluation if the symptoms worsen or if the condition fails to improve as anticipated. We discussed hand washing, using hand sanitizer when soap and water are not available, only going out when absolutely necessary, and social distancing. Explained to patient that she is immunocompromised and will need to take precautions during this time.   I provided 25 minutes of non-face-to-face time during this encounter.  [name], FNP-BC [facility] [address] [place], [name] [address] [phone]

## 2018-06-06 ENCOUNTER — Telehealth: Payer: Self-pay

## 2018-06-06 NOTE — Telephone Encounter (Signed)
Denied. Patient has been instructed that this medication is not long term. She will need to take tylenol for her pain.

## 2018-06-06 NOTE — Telephone Encounter (Signed)
Patient requesting a refill on Acetaminophen/Codeine

## 2018-06-06 NOTE — Telephone Encounter (Signed)
Left a vm for patient to callback 

## 2018-06-07 NOTE — Telephone Encounter (Signed)
Rachel Gonzalez had a telephone appointment with patient regarding this matter

## 2018-06-08 ENCOUNTER — Ambulatory Visit: Payer: Medicare HMO | Admitting: Family Medicine

## 2018-06-21 ENCOUNTER — Telehealth: Payer: Self-pay

## 2018-06-21 NOTE — Telephone Encounter (Signed)
Patient calling again regarding paperwork for her license. Patient wants a letter faxed to [facility]. Patient states that her license will be suspended on 4/16 if the letter is not sent. Patient was advise that Debby Bud was requesting the paperwork to be faxed back to our office because she didn't fill the paperwork out. Patient is aware that you are out of office until tomorrow and would like a call. Patient states that she will file a complaint with the medical board if this is not taking care of.  Patient also wants call from the office manager.

## 2018-06-22 ENCOUNTER — Telehealth: Payer: Self-pay | Admitting: Family Medicine

## 2018-06-22 MED ORDER — IBUPROFEN 800 MG PO TABS: 800.0000 mg | ORAL_TABLET | Freq: Three times a day (TID) | ORAL | 0 refills | Status: DC | PRN

## 2018-06-22 NOTE — Telephone Encounter (Signed)
Spoke with the DMV using [phone].  THey stated they were unable to fax the paperwork here; however, they reviewed the information with me and there was a vision form completed by an eye doctor in August of 2019 with an office address of [address] [place].  I looked up this address and it belongs to [facility].  I spoke with patients sister and she stated she did see and eye MD and I provided her with their office number of [phone].  She was very appreciative of the information and follow up.

## 2018-06-22 NOTE — Telephone Encounter (Signed)
Message left for patient to call back

## 2018-06-23 NOTE — Telephone Encounter (Signed)
Duplicate

## 2018-06-23 NOTE — Telephone Encounter (Signed)
Patient called yesterday afternoon and stated that she spoke with Dr. Laruth Bouchard office. She states that they did not fill out the paperwork either and her driver's license will be suspended as of 06/23/2018. The DMV is supposed to send her paperwork to fill out again. I am not sure if she is going to bring it to [place] or send it to Dr. Dione Booze.  Thanks,  Natasja Niday

## 2018-07-19 ENCOUNTER — Encounter: Payer: Self-pay | Admitting: Podiatry

## 2018-07-19 ENCOUNTER — Other Ambulatory Visit: Payer: Self-pay

## 2018-07-19 ENCOUNTER — Ambulatory Visit: Payer: Medicare HMO | Admitting: Podiatry

## 2018-07-19 VITALS — Temp 97.3°F

## 2018-07-19 DIAGNOSIS — E1142 Type 2 diabetes mellitus with diabetic polyneuropathy: Secondary | ICD-10-CM | POA: Diagnosis not present

## 2018-07-19 DIAGNOSIS — B351 Tinea unguium: Secondary | ICD-10-CM

## 2018-07-19 DIAGNOSIS — M79676 Pain in unspecified toe(s): Secondary | ICD-10-CM

## 2018-07-20 ENCOUNTER — Encounter: Payer: Self-pay | Admitting: Podiatry

## 2018-07-20 NOTE — Progress Notes (Signed)
She presents today chief complaint of painful elongated toenails 1 through 5 bilateral.  Objective: Toenails are long thick yellow dystrophic-like mycotic painful palpation.  Assessment: Pain limb secondary to onychomycosis.  Plan: Debridement of toenails 1 through 5 bilateral. 

## 2018-08-08 ENCOUNTER — Telehealth: Payer: Self-pay

## 2018-08-08 NOTE — Telephone Encounter (Signed)
Called, spoke with patient. She is requesting a new form for handicap placard be filled out. I advised we would do this for her. I have placed form in NP's box and will call patient when ready. Thanks!

## 2018-08-19 ENCOUNTER — Other Ambulatory Visit: Payer: Self-pay | Admitting: Family Medicine

## 2018-08-31 ENCOUNTER — Ambulatory Visit (INDEPENDENT_AMBULATORY_CARE_PROVIDER_SITE_OTHER): Payer: Medicare HMO | Admitting: Family Medicine

## 2018-08-31 ENCOUNTER — Other Ambulatory Visit: Payer: Self-pay

## 2018-08-31 ENCOUNTER — Encounter: Payer: Self-pay | Admitting: Family Medicine

## 2018-08-31 VITALS — BP 190/82 | HR 70 | Temp 98.8°F | Resp 16 | Ht 64.0 in | Wt 227.0 lb

## 2018-08-31 DIAGNOSIS — H9203 Otalgia, bilateral: Secondary | ICD-10-CM | POA: Diagnosis not present

## 2018-08-31 DIAGNOSIS — I10 Essential (primary) hypertension: Secondary | ICD-10-CM | POA: Diagnosis not present

## 2018-08-31 DIAGNOSIS — N183 Chronic kidney disease, stage 3 unspecified: Secondary | ICD-10-CM

## 2018-08-31 DIAGNOSIS — E1165 Type 2 diabetes mellitus with hyperglycemia: Secondary | ICD-10-CM | POA: Diagnosis not present

## 2018-08-31 DIAGNOSIS — R8290 Unspecified abnormal findings in urine: Secondary | ICD-10-CM | POA: Diagnosis not present

## 2018-08-31 DIAGNOSIS — E785 Hyperlipidemia, unspecified: Secondary | ICD-10-CM

## 2018-08-31 LAB — POCT URINALYSIS DIPSTICK
Bilirubin, UA: NEGATIVE
Glucose, UA: NEGATIVE
Ketones, UA: NEGATIVE
Nitrite, UA: NEGATIVE
Protein, UA: POSITIVE — AB
Spec Grav, UA: 1.02 (ref 1.010–1.025)
Urobilinogen, UA: 0.2 E.U./dL
pH, UA: 5.5 (ref 5.0–8.0)

## 2018-08-31 LAB — POCT GLYCOSYLATED HEMOGLOBIN (HGB A1C): Hemoglobin A1C: 11.7 % — AB (ref 4.0–5.6)

## 2018-08-31 LAB — GLUCOSE, POCT (MANUAL RESULT ENTRY): POC Glucose: 157 mg/dl — AB (ref 70–99)

## 2018-08-31 MED ORDER — AMLODIPINE BESYLATE 10 MG PO TABS: 10.0000 mg | ORAL_TABLET | Freq: Every day | ORAL | 3 refills | Status: DC

## 2018-08-31 MED ORDER — FLUTICASONE PROPIONATE 50 MCG/ACT NA SUSP: 2.0000 | Freq: Every day | NASAL | 6 refills | Status: DC

## 2018-08-31 MED ORDER — TRESIBA FLEXTOUCH 200 UNIT/ML ~~LOC~~ SOPN: 45.0000 [IU] | PEN_INJECTOR | Freq: Every day | SUBCUTANEOUS | 11 refills | Status: DC

## 2018-08-31 NOTE — Patient Instructions (Signed)
Diabetes Mellitus and Standards of Medical Care Managing diabetes (diabetes mellitus) can be complicated. Your diabetes treatment may be managed by a team of health care providers, including:  A physician who specializes in diabetes (endocrinologist).  A nurse practitioner or physician assistant.  Nurses.  A diet and nutrition specialist (registered dietitian).  A certified diabetes educator (CDE).  An exercise specialist.  A pharmacist.  An eye doctor.  A foot specialist (podiatrist).  A dentist.  A primary care provider.  A mental health provider. Your health care providers follow guidelines to help you get the best quality of care. The following schedule is a general guideline for your diabetes management plan. Your health care providers may give you more specific instructions. Physical exams Upon being diagnosed with diabetes mellitus, and each year after that, your health care provider will ask about your medical and family history. He or she will also do a physical exam. Your exam may include:  Measuring your height, weight, and body mass index (BMI).  Checking your blood pressure. This will be done at every routine medical visit. Your target blood pressure may vary depending on your medical conditions, your age, and other factors.  Thyroid gland exam.  Skin exam.  Screening for damage to your nerves (peripheral neuropathy). This may include checking the pulse in your legs and feet and checking the level of sensation in your hands and feet.  A complete foot exam to inspect the structure and skin of your feet, including checking for cuts, bruises, redness, blisters, sores, or other problems.  Screening for blood vessel (vascular) problems, which may include checking the pulse in your legs and feet and checking your temperature. Blood tests Depending on your treatment plan and your personal needs, you may have the following tests done:  HbA1c (hemoglobin A1c). This  test provides information about blood sugar (glucose) control over the previous 2-3 months. It is used to adjust your treatment plan, if needed. This test will be done: ? At least 2 times a year, if you are meeting your treatment goals. ? 4 times a year, if you are not meeting your treatment goals or if treatment goals have changed.  Lipid testing, including total, LDL, and HDL cholesterol and triglyceride levels. ? The goal for LDL is less than 100 mg/dL (5.5 mmol/L). If you are at high risk for complications, the goal is less than 70 mg/dL (3.9 mmol/L). ? The goal for HDL is 40 mg/dL (2.2 mmol/L) or higher for men and 50 mg/dL (2.8 mmol/L) or higher for women. An HDL cholesterol of 60 mg/dL (3.3 mmol/L) or higher gives some protection against heart disease. ? The goal for triglycerides is less than 150 mg/dL (8.3 mmol/L).  Liver function tests.  Kidney function tests.  Thyroid function tests. Dental and eye exams  Visit your dentist two times a year.  If you have type 1 diabetes, your health care provider may recommend an eye exam 3-5 years after you are diagnosed, and then once a year after your first exam. ? For children with type 1 diabetes, a health care provider may recommend an eye exam when your child is age 10 or older and has had diabetes for 3-5 years. After the first exam, your child should get an eye exam once a year.  If you have type 2 diabetes, your health care provider may recommend an eye exam as soon as you are diagnosed, and then once a year after your first exam. Immunizations   The   yearly flu (influenza) vaccine is recommended for everyone 6 months or older who has diabetes.  The pneumonia (pneumococcal) vaccine is recommended for everyone 2 years or older who has diabetes. If you are 7565 or older, you may get the pneumonia vaccine as a series of two separate shots.  The hepatitis B vaccine is recommended for adults shortly after being diagnosed with diabetes.   Adults and children with diabetes should receive all other vaccines according to age-specific recommendations from the Centers for Disease Control and Prevention (CDC). Mental and emotional health Screening for symptoms of eating disorders, anxiety, and depression is recommended at the time of diagnosis and afterward as needed. If your screening shows that you have symptoms (positive screening result), you may need more evaluation and you may work with a mental health care provider. Treatment plan Your treatment plan will be reviewed at every medical visit. You and your health care provider will discuss:  How you are taking your medicines, including insulin.  Any side effects you are experiencing.  Your blood glucose target goals.  The frequency of your blood glucose monitoring.  Lifestyle habits, such as activity level as well as tobacco, alcohol, and substance use. Diabetes self-management education Your health care provider will assess how well you are monitoring your blood glucose levels and whether you are taking your insulin correctly. He or she may refer you to:  A certified diabetes educator to manage your diabetes throughout your life, starting at diagnosis.  A registered dietitian who can create or review your personal nutrition plan.  An exercise specialist who can discuss your activity level and exercise plan. Summary  Managing diabetes (diabetes mellitus) can be complicated. Your diabetes treatment may be managed by a team of health care providers.  Your health care providers follow guidelines in order to help you get the best quality of care.  Standards of care including having regular physical exams, blood tests, blood pressure monitoring, immunizations, screening tests, and education about how to manage your diabetes.  Your health care providers may also give you more specific instructions based on your individual health. This information is not intended to replace  advice given to you by your health care provider. Make sure you discuss any questions you have with your health care provider. Document Released: 12/21/2008 Document Revised: 11/12/2017 Document Reviewed: 11/22/2015 Elsevier Interactive Patient Education  2019 [employer] Hypertension Hypertension is another name for high blood pressure. High blood pressure forces your heart to work harder to pump blood. This can cause problems over time. There are two numbers in a blood pressure reading. There is a top number (systolic) over a bottom number (diastolic). It is best to have a blood pressure below 120/80. Healthy choices can help lower your blood pressure. You may need medicine to help lower your blood pressure if:  Your blood pressure cannot be lowered with healthy choices.  Your blood pressure is higher than 130/80. Follow these instructions at home: Eating and drinking   If directed, follow the DASH eating plan. This diet includes: ? Filling half of your plate at each meal with fruits and vegetables. ? Filling one quarter of your plate at each meal with whole grains. Whole grains include whole wheat pasta, brown rice, and whole grain bread. ? Eating or drinking low-fat dairy products, such as skim milk or low-fat yogurt. ? Filling one quarter of your plate at each meal with low-fat (lean) proteins. Low-fat proteins include fish, skinless chicken, eggs, beans, and tofu. ? Avoiding  fatty meat, cured and processed meat, or chicken with skin. ? Avoiding premade or processed food.  Eat less than 1,500 mg of salt (sodium) a day.  Limit alcohol use to no more than 1 drink a day for nonpregnant women and 2 drinks a day for men. One drink equals 12 oz of beer, 5 oz of wine, or 1 oz of hard liquor. Lifestyle  Work with your doctor to stay at a healthy weight or to lose weight. Ask your doctor what the best weight is for you.  Get at least 30 minutes of exercise that causes your heart to beat  faster (aerobic exercise) most days of the week. This may include walking, swimming, or biking.  Get at least 30 minutes of exercise that strengthens your muscles (resistance exercise) at least 3 days a week. This may include lifting weights or pilates.  Do not use any products that contain nicotine or tobacco. This includes cigarettes and e-cigarettes. If you need help quitting, ask your doctor.  Check your blood pressure at home as told by your doctor.  Keep all follow-up visits as told by your doctor. This is important. Medicines  Take over-the-counter and prescription medicines only as told by your doctor. Follow directions carefully.  Do not skip doses of blood pressure medicine. The medicine does not work as well if you skip doses. Skipping doses also puts you at risk for problems.  Ask your doctor about side effects or reactions to medicines that you should watch for. Contact a doctor if:  You think you are having a reaction to the medicine you are taking.  You have headaches that keep coming back (recurring).  You feel dizzy.  You have swelling in your ankles.  You have trouble with your vision. Get help right away if:  You get a very bad headache.  You start to feel confused.  You feel weak or numb.  You feel faint.  You get very bad pain in your: ? Chest. ? Belly (abdomen).  You throw up (vomit) more than once.  You have trouble breathing. Summary  Hypertension is another name for high blood pressure.  Making healthy choices can help lower blood pressure. If your blood pressure cannot be controlled with healthy choices, you may need to take medicine. This information is not intended to replace advice given to you by your health care provider. Make sure you discuss any questions you have with your health care provider. Document Released: 08/12/2007 Document Revised: 01/22/2016 Document Reviewed: 01/22/2016 Elsevier Interactive Patient Education  2019  [employer].

## 2018-08-31 NOTE — Progress Notes (Signed)
Patient [facility]   Progress Note: General Provider: Lanae Boast, FNP  SUBJECTIVE:   Rachel Gonzalez is a 68 y.o. female who  has a past medical history of Anemia, Arthritis, Diabetes mellitus, GERD (gastroesophageal reflux disease), Hypertension, and Renal disorder.. Patient presents today for Diabetes, Hypertension, and Ear Pain (both ears ) She reports bilateral ear pain for the past week. History of allergic rhinitis and is not currently on medications. She is non compliant with glucose monitoring, diet and exercise. She report compliance with medications with the exception of allergy medications. She has a history of right great toe amputation and uncontrolled diabetes. She has requested diabetic shoes and has not received them. She has been referred to endocrinology in the past and has not showed for the appointments. She also has a history of diabetic retinopathy and is followed by opthalmology.   Review of Systems  Constitutional: Negative.   HENT: Positive for ear pain.   Eyes: Negative.   Respiratory: Negative.   Cardiovascular: Negative.   Gastrointestinal: Negative.   Genitourinary: Negative.   Musculoskeletal: Negative.   Skin: Negative.   Neurological: Negative.   Psychiatric/Behavioral: Negative.      OBJECTIVE: BP (!) 190/82 (BP Location: Right Arm, Patient Position: Sitting, Cuff Size: Normal) Comment: manually  Pulse 70   Temp 98.8 F (37.1 C) (Oral)   Resp 16   Ht 5\' 4"  (1.626 m)   Wt 227 lb (103 kg)   SpO2 100%   BMI 38.96 kg/m   Wt Readings from Last 3 Encounters:  08/31/18 227 lb (103 kg)  05/11/18 226 lb 12.8 oz (102.9 kg)  03/15/18 200 lb (90.7 kg)     Physical Exam Vitals signs and nursing note reviewed.  Constitutional:      General: She is not in acute distress.    Appearance: Normal appearance.  HENT:     Head: Normocephalic and atraumatic.     Right Ear: Hearing normal. Tympanic membrane is  injected. Tympanic membrane is not erythematous.     Left Ear: Hearing normal. Tympanic membrane is injected. Tympanic membrane is not erythematous.     Mouth/Throat:     Lips: Pink.     Mouth: Mucous membranes are moist.     Pharynx: Oropharynx is clear.  Eyes:     Extraocular Movements: Extraocular movements intact.     Conjunctiva/sclera: Conjunctivae normal.     Pupils: Pupils are equal, round, and reactive to light.  Cardiovascular:     Rate and Rhythm: Normal rate and regular rhythm.     Heart sounds: No murmur.  Pulmonary:     Effort: Pulmonary effort is normal.     Breath sounds: Normal breath sounds.  Musculoskeletal: Normal range of motion.  Skin:    General: Skin is warm and dry.  Neurological:     Mental Status: She is alert and oriented to person, place, and time.  Psychiatric:        Mood and Affect: Mood normal.        Behavior: Behavior normal.        Thought Content: Thought content normal.        Judgment: Judgment normal.     ASSESSMENT/PLAN:   1. Essential hypertension - Urinalysis Dipstick - CBC With Differential; Future - Comprehensive metabolic panel; Future  2. Uncontrolled type 2 diabetes mellitus with hyperglycemia (HCC) - HgB A1c - Glucose (CBG) - Urinalysis Dipstick - Ambulatory referral to Endocrinology - Insulin Degludec (  TRESIBA FLEXTOUCH) 200 UNIT/ML SOPN; Inject 46 Units into the skin daily.  Dispense: 5 pen; Refill: 11  3. Abnormal urinalysis - Urine Culture - sulfamethoxazole-trimethoprim (BACTRIM DS) 800-160 MG tablet; Take 1 tablet by mouth 2 (two) times daily for 7 days.  Dispense: 14 tablet; Refill: 0  4. Stage 3 chronic kidney disease (HCC)  5. Hyperlipidemia, unspecified hyperlipidemia type - Lipid Panel With LDL/HDL Ratio; Future  6. Otalgia of both ears - fluticasone (FLONASE) 50 MCG/ACT nasal spray; Place 2 sprays into both nostrils daily.  Dispense: 16 g; Refill: 6    Return in about 3 months (around 12/01/2018) for  Has appt in Jul with [facility].    The patient was given clear instructions to go to ER or return to medical center if symptoms do not improve, worsen or new problems develop. The patient verbalized understanding and agreed with plan of care.   [name], FNP-BC [facility] [address]  [place], [place] [id number] [phone]

## 2018-09-01 DIAGNOSIS — E113511 Type 2 diabetes mellitus with proliferative diabetic retinopathy with macular edema, right eye: Secondary | ICD-10-CM | POA: Diagnosis not present

## 2018-09-01 DIAGNOSIS — Z961 Presence of intraocular lens: Secondary | ICD-10-CM | POA: Diagnosis not present

## 2018-09-01 DIAGNOSIS — H3582 Retinal ischemia: Secondary | ICD-10-CM | POA: Diagnosis not present

## 2018-09-01 DIAGNOSIS — E113512 Type 2 diabetes mellitus with proliferative diabetic retinopathy with macular edema, left eye: Secondary | ICD-10-CM | POA: Diagnosis not present

## 2018-09-01 DIAGNOSIS — H35373 Puckering of macula, bilateral: Secondary | ICD-10-CM | POA: Diagnosis not present

## 2018-09-04 LAB — URINE CULTURE

## 2018-09-05 MED ORDER — SULFAMETHOXAZOLE-TRIMETHOPRIM 800-160 MG PO TABS: 1.0000 | ORAL_TABLET | Freq: Two times a day (BID) | ORAL | 0 refills | Status: AC

## 2018-09-05 NOTE — Progress Notes (Signed)
Please let patient know that she has a UTI. I sent medication to the pharmacy for this. Please complete all antibiotics as prescribed.

## 2018-09-06 ENCOUNTER — Telehealth: Payer: Self-pay

## 2018-09-06 NOTE — Telephone Encounter (Signed)
Called and spoke with patient. Advised that culture shows uti and that antibiotic has been sent in for this. Asked that she take as directed until complete. Patient verbalized understanding. Thanks!

## 2018-09-06 NOTE — Telephone Encounter (Signed)
-----   Message from Rachel Gonzalez, [place] sent at 09/05/2018  5:20 PM EDT ----- Please let patient know that she has a UTI. I sent medication to the pharmacy for this. Please complete all antibiotics as prescribed.

## 2018-09-06 NOTE — Telephone Encounter (Signed)
Called, no answer. Left a message to call back. Thanks!  

## 2018-09-14 ENCOUNTER — Encounter: Payer: Self-pay | Admitting: Family Medicine

## 2018-09-14 ENCOUNTER — Telehealth: Payer: Self-pay | Admitting: Podiatry

## 2018-09-14 NOTE — Telephone Encounter (Signed)
Received call from office of Lanae Boast FNP stating they have gotten several calls from pts stating they are not signing off on them to get diabetic shoes. They have been trying to get the shoes since October of last year. I explained that this pt was to be seeing Dr Hardie Lora who is the endocrinologist but never was seen so he will not sign paperwork. I explained that the np or pa cannot sign off per medicare guidelines. She stated pt is seeing Dr Antony Blackbird tomorrow. I have redone the paperwork in [facility] name and it will be faxed to that office.

## 2018-09-15 ENCOUNTER — Other Ambulatory Visit: Payer: Self-pay | Admitting: Family Medicine

## 2018-09-15 ENCOUNTER — Ambulatory Visit: Payer: Medicare HMO | Admitting: Family Medicine

## 2018-09-15 DIAGNOSIS — S98111A Complete traumatic amputation of right great toe, initial encounter: Secondary | ICD-10-CM

## 2018-09-15 DIAGNOSIS — E1165 Type 2 diabetes mellitus with hyperglycemia: Secondary | ICD-10-CM

## 2018-09-15 NOTE — Progress Notes (Signed)
Patient to have evaluation in order to have license re-instated by the [facility].

## 2018-09-20 ENCOUNTER — Other Ambulatory Visit: Payer: Self-pay | Admitting: Family Medicine

## 2018-09-20 DIAGNOSIS — L304 Erythema intertrigo: Secondary | ICD-10-CM

## 2018-10-04 ENCOUNTER — Telehealth: Payer: Self-pay | Admitting: Podiatry

## 2018-10-04 ENCOUNTER — Other Ambulatory Visit: Payer: Self-pay | Admitting: Family Medicine

## 2018-10-04 DIAGNOSIS — I10 Essential (primary) hypertension: Secondary | ICD-10-CM

## 2018-10-04 DIAGNOSIS — IMO0001 Reserved for inherently not codable concepts without codable children: Secondary | ICD-10-CM

## 2018-10-04 NOTE — Telephone Encounter (Signed)
Left message for pt to call with new appt date for Dr Chapman Fitch. The diabetic shoe order is on hold until she is seen by the md/do.

## 2018-10-11 ENCOUNTER — Other Ambulatory Visit: Payer: Self-pay | Admitting: Family Medicine

## 2018-10-11 DIAGNOSIS — E1165 Type 2 diabetes mellitus with hyperglycemia: Secondary | ICD-10-CM

## 2018-10-12 ENCOUNTER — Other Ambulatory Visit: Payer: Self-pay | Admitting: Family Medicine

## 2018-10-12 DIAGNOSIS — I10 Essential (primary) hypertension: Secondary | ICD-10-CM

## 2018-10-12 DIAGNOSIS — IMO0001 Reserved for inherently not codable concepts without codable children: Secondary | ICD-10-CM

## 2018-10-24 ENCOUNTER — Ambulatory Visit: Payer: Medicare HMO | Admitting: Internal Medicine

## 2018-10-25 ENCOUNTER — Other Ambulatory Visit: Payer: Self-pay | Admitting: Family Medicine

## 2018-10-31 ENCOUNTER — Telehealth: Payer: Self-pay

## 2018-11-09 ENCOUNTER — Telehealth: Payer: Self-pay

## 2018-11-09 ENCOUNTER — Other Ambulatory Visit: Payer: Self-pay | Admitting: Family Medicine

## 2018-11-09 DIAGNOSIS — E1165 Type 2 diabetes mellitus with hyperglycemia: Secondary | ICD-10-CM

## 2018-11-09 MED ORDER — ATORVASTATIN CALCIUM 10 MG PO TABS: 10.0000 mg | ORAL_TABLET | Freq: Every day | ORAL | 3 refills | Status: DC

## 2018-11-09 NOTE — Progress Notes (Signed)
Please let patient know that I started her on Lipitor (atorvastatin) due to her being diabetic. This is to protect her heart.

## 2018-11-09 NOTE — Telephone Encounter (Signed)
Called and spoke with patient, advised that we have started her on lipitor due to her being diabetic to protect her heart. Patient verbalized understanding. Thanks!

## 2018-11-09 NOTE — Telephone Encounter (Signed)
-----   Message from Lanae Boast, [place] sent at 11/09/2018  8:39 AM EDT ----- Please let patient know that I started her on Lipitor (atorvastatin) due to her being diabetic. This is to protect her heart.

## 2018-11-10 NOTE — Telephone Encounter (Signed)
error 

## 2018-11-16 ENCOUNTER — Encounter (HOSPITAL_COMMUNITY): Payer: Self-pay

## 2018-11-16 ENCOUNTER — Encounter (HOSPITAL_COMMUNITY): Payer: Self-pay | Admitting: *Deleted

## 2018-11-23 ENCOUNTER — Ambulatory Visit (INDEPENDENT_AMBULATORY_CARE_PROVIDER_SITE_OTHER): Payer: Medicare HMO

## 2018-11-23 ENCOUNTER — Encounter (HOSPITAL_COMMUNITY): Payer: Self-pay

## 2018-11-23 ENCOUNTER — Other Ambulatory Visit: Payer: Self-pay

## 2018-11-23 ENCOUNTER — Ambulatory Visit (HOSPITAL_COMMUNITY)
Admission: EM | Admit: 2018-11-23 | Discharge: 2018-11-23 | Disposition: A | Discharge status: Home / Self Care | Payer: Medicare HMO | Attending: Family Medicine | Admitting: Family Medicine

## 2018-11-23 DIAGNOSIS — E1122 Type 2 diabetes mellitus with diabetic chronic kidney disease: Secondary | ICD-10-CM | POA: Diagnosis not present

## 2018-11-23 DIAGNOSIS — K219 Gastro-esophageal reflux disease without esophagitis: Secondary | ICD-10-CM | POA: Insufficient documentation

## 2018-11-23 DIAGNOSIS — R079 Chest pain, unspecified: Secondary | ICD-10-CM

## 2018-11-23 DIAGNOSIS — Z794 Long term (current) use of insulin: Secondary | ICD-10-CM | POA: Diagnosis not present

## 2018-11-23 DIAGNOSIS — R609 Edema, unspecified: Secondary | ICD-10-CM

## 2018-11-23 DIAGNOSIS — Z79899 Other long term (current) drug therapy: Secondary | ICD-10-CM | POA: Insufficient documentation

## 2018-11-23 DIAGNOSIS — Z6838 Body mass index (BMI) 38.0-38.9, adult: Secondary | ICD-10-CM | POA: Insufficient documentation

## 2018-11-23 DIAGNOSIS — Z885 Allergy status to narcotic agent status: Secondary | ICD-10-CM | POA: Insufficient documentation

## 2018-11-23 DIAGNOSIS — D649 Anemia, unspecified: Secondary | ICD-10-CM | POA: Insufficient documentation

## 2018-11-23 DIAGNOSIS — I10 Essential (primary) hypertension: Secondary | ICD-10-CM | POA: Diagnosis not present

## 2018-11-23 DIAGNOSIS — R600 Localized edema: Secondary | ICD-10-CM | POA: Diagnosis not present

## 2018-11-23 DIAGNOSIS — E669 Obesity, unspecified: Secondary | ICD-10-CM | POA: Diagnosis not present

## 2018-11-23 DIAGNOSIS — N183 Chronic kidney disease, stage 3 (moderate): Secondary | ICD-10-CM | POA: Diagnosis not present

## 2018-11-23 DIAGNOSIS — Z833 Family history of diabetes mellitus: Secondary | ICD-10-CM | POA: Insufficient documentation

## 2018-11-23 DIAGNOSIS — I1310 Hypertensive heart and chronic kidney disease without heart failure, with stage 1 through stage 4 chronic kidney disease, or unspecified chronic kidney disease: Secondary | ICD-10-CM | POA: Insufficient documentation

## 2018-11-23 LAB — COMPREHENSIVE METABOLIC PANEL
ALT: 14 U/L (ref 0–44)
AST: 15 U/L (ref 15–41)
Albumin: 3.5 g/dL (ref 3.5–5.0)
Alkaline Phosphatase: 137 U/L — ABNORMAL HIGH (ref 38–126)
Anion gap: 11 (ref 5–15)
BUN: 24 mg/dL — ABNORMAL HIGH (ref 8–23)
CO2: 21 mmol/L — ABNORMAL LOW (ref 22–32)
Calcium: 9.1 mg/dL (ref 8.9–10.3)
Chloride: 108 mmol/L (ref 98–111)
Creatinine, Ser: 1.45 mg/dL — ABNORMAL HIGH (ref 0.44–1.00)
GFR calc Af Amer: 43 mL/min — ABNORMAL LOW (ref >60)
GFR calc non Af Amer: 37 mL/min — ABNORMAL LOW (ref >60)
Glucose, Bld: 160 mg/dL — ABNORMAL HIGH (ref 70–99)
Potassium: 4.5 mmol/L (ref 3.5–5.1)
Sodium: 140 mmol/L (ref 135–145)
Total Bilirubin: 0.5 mg/dL (ref 0.3–1.2)
Total Protein: 7.1 g/dL (ref 6.5–8.1)

## 2018-11-23 LAB — BRAIN NATRIURETIC PEPTIDE: B Natriuretic Peptide: 98.9 pg/mL (ref 0.0–100.0)

## 2018-11-23 MED ORDER — FUROSEMIDE 20 MG PO TABS: ORAL_TABLET | ORAL | 0 refills | Status: DC

## 2018-11-23 NOTE — ED Provider Notes (Signed)
[place]    CSN: [id number] Arrival date & time: 11/23/18  1510      History   Chief Complaint Chief Complaint  Patient presents with  . Chest Pain    Center  . Leg Swelling    HPI Rachel Gonzalez is a 68 y.o. female.   [name] presents with complaints of bilateral leg swelling. This has been ongoing for at least the past month but is worse today. Painful. No redness or warmth. States she has bilateral shoulder pain which causes chest pain , this is not new for her and is not worse. No associated nausea or diaphoresis. She feels short of breath, notices it a lot at night, wakes her up. Some doe. No cough. Has been on lasix in the past but states she ran out a few months ago and has not restarted. It appears she is on amlodipine for her BP, states she did take it today. History of anemia, arthritis, DM, gerd, HTN, kidney disease, chest pain, neuropathy, obesity, arm pain, osteoarthritis.     ROS per HPI, negative if not otherwise mentioned.      Past Medical History:  Diagnosis Date  . Anemia   . Arthritis    right finger  . Diabetes mellitus   . GERD (gastroesophageal reflux disease)   . Hypertension   . Renal disorder     Patient Active Problem List   Diagnosis Date Noted  . Stage 3 chronic kidney disease ([place]) 12/01/2016  . Frequent falls 04/28/2016  . Right arm weakness 04/28/2016  . Bilateral lower extremity edema 04/28/2016  . Screening for colon cancer 04/28/2016  . Abnormal urinalysis 09/13/2014  . Chest pain 09/13/2014  . Abdominal pain, recurrent 09/13/2014  . Dehydration with hyponatremia 09/13/2014  . Acute hyperkalemia 09/13/2014  . Acute renal failure superimposed on stage 3 chronic kidney disease ([place]) 09/13/2014  . Anemia 05/06/2014  . Diabetic neuropathy, type II diabetes mellitus ([place]) 05/06/2014  . Obesity (BMI 30-39.9) 05/06/2014  . Cellulitis of right foot 05/06/2014  . Essential hypertension 03/07/2011  . PERS HX  NONCOMPLIANCE W/MED TX PRS HAZARDS HLTH 02/07/2009  . Right arm pain 08/04/2006  . Osteoarthritis 02/14/2006  . Uncontrolled type 2 diabetes mellitus with hyperglycemia ([place]) 01/11/2006    Past Surgical History:  Procedure Laterality Date  . AMPUTATION Right 05/08/2014   Procedure: AMPUTATION RAY, right great toe;  Surgeon: Newt Minion, MD;  Location: [place];  Service: Orthopedics;  Laterality: Right;  . arm surgery     . CESAREAN SECTION    . COLONOSCOPY    . I&D EXTREMITY Right 05/08/2014   Procedure: IRRIGATION AND DEBRIDEMENT FOOT;  Surgeon: Newt Minion, MD;  Location: [facility];  Service: Orthopedics;  Laterality: Right;  . MEMBRANE PEEL Right 03/15/2018   Procedure: MEMBRANE PEEL;  Surgeon: Jalene Mullet, MD;  Location: [facility];  Service: Ophthalmology;  Laterality: Right;  . PHOTOCOAGULATION WITH LASER Right 03/15/2018   Procedure: PHOTOCOAGULATION WITH LASER;  Surgeon: Jalene Mullet, MD;  Location: [facility];  Service: Ophthalmology;  Laterality: Right;  . 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      Home Medications    Prior to Admission medications   Medication Sig Start  Date End Date Taking? Authorizing Provider  acetaminophen (TYLENOL) 500 MG tablet Take 500-1,000 mg by mouth every 6 (six) hours as needed (pain.).    [provider]  acetaminophen-codeine (TYLENOL #3) 300-30 MG tablet TAKE 1 TABLET BY MOUTH EVERY 6 HOURS AS NEEDED FOR MODERATE PAIN 03/24/18   Lanae Boast, FNP  amLODipine (NORVASC) 10 MG tablet Take 1 tablet (10 mg total) by mouth daily. 08/31/18   Lanae Boast, FNP  atorvastatin (LIPITOR) 10 MG tablet Take 1 tablet (10 mg total) by mouth daily. 11/09/18   Lanae Boast, FNP   Blood Glucose Monitoring Suppl (ACCU-CHEK AVIVA PLUS) w/Device KIT 1 Device by Does not apply route once. Use as directed to check blood sugar 06/11/15   Dorena Dew, FNP  Blood Glucose Monitoring Suppl (ONE TOUCH ULTRA SYSTEM KIT) w/Device KIT Test three times daily. Dx Type 2 Diabetes. 06/10/15   Dorena Dew, FNP  Continuous Blood Gluc Receiver (FREESTYLE LIBRE 14 DAY READER) DEVI 1 each by Does not apply route 4 (four) times daily -  before meals and at bedtime. 06/30/17   Dorena Dew, FNP  dorzolamide-timolol (COSOPT) 22.3-6.8 MG/ML ophthalmic solution INSTILL 1 DROP INTO RIGHT EYE TWICE DAILY 06/29/18   [provider]  fluticasone (FLONASE) 50 MCG/ACT nasal spray Place 2 sprays into both nostrils daily. 08/31/18   Lanae Boast, FNP  furosemide (LASIX) 20 MG tablet Take 2 tablets (40 mg total) by mouth daily for 7 days, THEN 1 tablet (20 mg total) daily for 21 days. 11/23/18 12/21/18  Zigmund Gottron, NP  gabapentin (NEURONTIN) 300 MG capsule Take 1 capsule by mouth twice daily 10/11/18   Lanae Boast, FNP  glucose blood (ACCU-CHEK AVIVA) test strip Use to check blood sugar 3 times daily and at bedtime. 06/11/15   Dorena Dew, FNP  ibuprofen (ADVIL) 800 MG tablet TAKE 1 TABLET BY MOUTH EVERY 8 HOURS AS NEEDED 10/11/18   Lanae Boast, FNP  Insulin Degludec (TRESIBA FLEXTOUCH) 200 UNIT/ML SOPN Inject 46 Units into the skin daily. 08/31/18   Lanae Boast, FNP  Lancet Devices [facility]) lancets Use as instructed 06/11/15   Dorena Dew, FNP  levocetirizine Harlow Ohms) 5 MG tablet TAKE ONE TABLET BY MOUTH ONCE DAILY IN THE EVENING 05/11/18   Lanae Boast, FNP  nystatin (NYSTATIN) powder Apply topically 4 (four) times daily. 05/11/18   Lanae Boast, FNP  nystatin ointment (MYCOSTATIN) APPLY 1 APPLICATION TOPICALLY 2 TIMES A DAY 09/20/18   Lanae Boast, FNP  omeprazole (PRILOSEC) 20 MG capsule Take 1 capsule by mouth once daily 10/25/18   Lanae Boast, FNP  ONE  TOUCH LANCETS MISC Test 3 times daily with glucometer. Dx Type 2 diabetes with unspecified complications. 06/10/15   Dorena Dew, FNP  RELION PEN NEEDLE 31G/8MM 31G X 8 MM MISC USE AS DIRECTED 4 TIMES DAILY 12/20/17   Lanae Boast, FNP    Family History Family History  Problem Relation Age of Onset  . Diabetes Mother     Social History Social History   Tobacco Use  . Smoking status: Never Smoker  . Smokeless tobacco: Never Used  Substance Use Topics  . Alcohol use: No  . Drug use: No     Allergies   Tramadol and Adhesive [tape]   Review of Systems Review of Systems   Physical Exam Triage Vital Signs ED Triage Vitals  Enc Vitals Group     BP 11/23/18 1526 (!) 184/81     Pulse Rate 11/23/18  1526 81     Resp 11/23/18 1526 16     Temp 11/23/18 1526 98.2 F (36.8 C)     Temp Source 11/23/18 1526 Oral     SpO2 11/23/18 1526 97 %     Weight 11/23/18 1523 225 lb (102.1 kg)     Height --      Head Circumference --      Peak Flow --      Pain Score 11/23/18 1523 8     Pain Loc --      Pain Edu? --      Excl. in [place]? --    No data found.  Updated Vital Signs BP (!) 184/81 (BP Location: Left Arm)   Pulse 81   Temp 98.2 F (36.8 C) (Oral)   Resp 16   Wt 225 lb (102.1 kg)   SpO2 97%   BMI 38.62 kg/m    Physical Exam Constitutional:      General: She is not in acute distress.    Appearance: She is well-developed.  Cardiovascular:     Rate and Rhythm: Normal rate and regular rhythm.     Heart sounds: Normal heart sounds.  Pulmonary:     Effort: Pulmonary effort is normal. No accessory muscle usage or respiratory distress.     Breath sounds: Normal breath sounds.  Chest:     Chest wall: Tenderness present.       Comments: Tenderness on palpation to right and left chest well as well as sternum  Musculoskeletal:     Right lower leg: 3+ Pitting Edema present.     Left lower leg: 3+ Pitting Edema present.  Skin:    General: Skin is warm and dry.   Neurological:     Mental Status: She is alert and oriented to person, place, and time.    EKG:  NRS rate 78 . Previous EKG was available for review. No stwave changes as interpreted by me.    UC Treatments / Results  Labs (all labs ordered are listed, but only abnormal results are displayed) Labs Reviewed  [place]    EKG   Radiology Dg Chest 2 View  Result Date: 11/23/2018 CLINICAL DATA:  68 year old female with chest pain. EXAM: CHEST - 2 VIEW COMPARISON:  Chest radiograph dated 08/24/2017 FINDINGS: Cardiomegaly with possible mild vascular congestion. No edema. There is no focal consolidation, pleural effusion, or pneumothorax. No acute osseous pathology. IMPRESSION: Cardiomegaly with possible mild vascular congestion. No focal consolidation. Electronically Signed   By: Anner Crete M.D.   On: 11/23/2018 16:21    Procedures Procedures (including critical care time)  Medications Ordered in UC Medications - No data to display  Initial Impression / Assessment and Plan / UC Course  I have reviewed the triage vital signs and the nursing notes.  Pertinent labs & imaging results that were available during my care of the patient were reviewed by me and considered in my medical decision making (see chart for details).     Hypertensive here in clinic, this appears unfortunately consistent with her last BP at her PCP clinic. Lower extremity edema bilaterally with some congestion on chest xray. No hypoxia. ekg without acute findings. Low suspicion for acs at this time. Lasix provided to help with edema. Encouraged follow up with PCP as liikely will need further BP management. cmp and bnp pending. Return precautions provided. Patient verbalized understanding and agreeable to plan.  Ambulatory out of clinic without difficulty.  Final Clinical Impressions(s) / UC Diagnoses   Final diagnoses:  Peripheral edema  Hypertension,  unspecified type     Discharge Instructions     Please restart lasix to help with your swelling. Take two tablets for the next week and then decrease to one tablet daily.  Please call your doctor office today to make an appointment for follow up as soon as possible for recheck, and likely need your blood pressure medication adjusted. The medication you are on can even contribute to some swelling  Please purchase and wear compression stockings to help with your swelling, elevate your legs as able.  Any increased pain, shortness of breath , sweating, nausea, please go to the ER.    ED Prescriptions    Medication Sig Dispense Auth. Provider   furosemide (LASIX) 20 MG tablet Take 2 tablets (40 mg total) by mouth daily for 7 days, THEN 1 tablet (20 mg total) daily for 21 days. 35 tablet Zigmund Gottron, NP     Controlled Substance Prescriptions [place] Controlled Substance Registry consulted? Not Applicable   Zigmund Gottron, NP 11/23/18 [phone]

## 2018-11-23 NOTE — Discharge Instructions (Signed)
Please restart lasix to help with your swelling. Take two tablets for the next week and then decrease to one tablet daily.  Please call your doctor office today to make an appointment for follow up as soon as possible for recheck, and likely need your blood pressure medication adjusted. The medication you are on can even contribute to some swelling  Please purchase and wear compression stockings to help with your swelling, elevate your legs as able.  Any increased pain, shortness of breath , sweating, nausea, please go to the ER.

## 2018-11-23 NOTE — ED Triage Notes (Addendum)
Patient presents to Urgent Care with complaints of centralized CP since 2 days ago that she feels like originates in her right shoulder which also has pain. Patient reports her legs have been swelling up to her thighs for about 3 weeks. Legs are painful to palpation, +4 pitting edema. Pt reports she has not been taking her lasix for "quite some time now".

## 2018-11-25 ENCOUNTER — Telehealth (HOSPITAL_COMMUNITY): Payer: Self-pay | Admitting: Emergency Medicine

## 2018-11-25 NOTE — Telephone Encounter (Signed)
Patient contacted and made aware of  lab  results, all questions answered   

## 2018-11-28 ENCOUNTER — Other Ambulatory Visit: Payer: Self-pay | Admitting: Family Medicine

## 2018-11-28 DIAGNOSIS — L304 Erythema intertrigo: Secondary | ICD-10-CM

## 2018-11-30 ENCOUNTER — Other Ambulatory Visit: Payer: Self-pay | Admitting: Family Medicine

## 2018-12-05 ENCOUNTER — Other Ambulatory Visit: Payer: Self-pay | Admitting: Family Medicine

## 2018-12-05 DIAGNOSIS — H35373 Puckering of macula, bilateral: Secondary | ICD-10-CM | POA: Diagnosis not present

## 2018-12-05 DIAGNOSIS — H3582 Retinal ischemia: Secondary | ICD-10-CM | POA: Diagnosis not present

## 2018-12-05 DIAGNOSIS — E113521 Type 2 diabetes mellitus with proliferative diabetic retinopathy with traction retinal detachment involving the macula, right eye: Secondary | ICD-10-CM | POA: Diagnosis not present

## 2018-12-05 DIAGNOSIS — E113522 Type 2 diabetes mellitus with proliferative diabetic retinopathy with traction retinal detachment involving the macula, left eye: Secondary | ICD-10-CM | POA: Diagnosis not present

## 2018-12-05 DIAGNOSIS — Z961 Presence of intraocular lens: Secondary | ICD-10-CM | POA: Diagnosis not present

## 2018-12-18 ENCOUNTER — Other Ambulatory Visit: Payer: Self-pay

## 2018-12-18 ENCOUNTER — Ambulatory Visit (HOSPITAL_COMMUNITY)
Admission: EM | Admit: 2018-12-18 | Discharge: 2018-12-18 | Disposition: A | Discharge status: Home / Self Care | Payer: Medicare HMO | Attending: Emergency Medicine | Admitting: Emergency Medicine

## 2018-12-18 ENCOUNTER — Encounter (HOSPITAL_COMMUNITY): Payer: Self-pay | Admitting: Emergency Medicine

## 2018-12-18 DIAGNOSIS — L03119 Cellulitis of unspecified part of limb: Secondary | ICD-10-CM

## 2018-12-18 MED ORDER — CEPHALEXIN 500 MG PO CAPS: 500.0000 mg | ORAL_CAPSULE | Freq: Three times a day (TID) | ORAL | 0 refills | Status: AC

## 2018-12-18 NOTE — ED Provider Notes (Signed)
Rachel Gonzalez    CSN: [id number] Arrival date & time: 12/18/18  1131      History   Chief Complaint Chief Complaint  Patient presents with  . Leg Pain    HPI Rachel Gonzalez is a 68 y.o. female.   Patient presents with bilateral lower leg redness and swelling.  She noted the redness yesterday.  The lower extremity edema is a chronic problem.  She denies shortness of breath.  She was seen here on 11/23/2018 for the same problem and started on Lasix.  She reports she is continuing to take the Lasix.  He denies fever or chills.  Medical history is significant for uncontrolled DM 2, noncompliance with medications, hypertension, cellulitis, chronic renal failure, bilateral lower extremity edema.  The history is provided by the patient.    Past Medical History:  Diagnosis Date  . Anemia   . Arthritis    right finger  . Diabetes mellitus   . GERD (gastroesophageal reflux disease)   . Hypertension   . Renal disorder     Patient Active Problem List   Diagnosis Date Noted  . Stage 3 chronic kidney disease 12/01/2016  . Frequent falls 04/28/2016  . Right arm weakness 04/28/2016  . Bilateral lower extremity edema 04/28/2016  . Screening for colon cancer 04/28/2016  . Abnormal urinalysis 09/13/2014  . Chest pain 09/13/2014  . Abdominal pain, recurrent 09/13/2014  . Dehydration with hyponatremia 09/13/2014  . Acute hyperkalemia 09/13/2014  . Acute renal failure superimposed on stage 3 chronic kidney disease ([place]) 09/13/2014  . Anemia 05/06/2014  . Diabetic neuropathy, type II diabetes mellitus ([place]) 05/06/2014  . Obesity (BMI 30-39.9) 05/06/2014  . Cellulitis of right foot 05/06/2014  . Essential hypertension 03/07/2011  . 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 Right arm pain 08/04/2006  . Osteoarthritis 02/14/2006  . Uncontrolled type 2 diabetes mellitus with hyperglycemia ([place]) 01/11/2006    Past Surgical History:  Procedure Laterality  Date  . AMPUTATION Right 05/08/2014   Procedure: AMPUTATION RAY, right great toe;  Surgeon: Newt Minion, MD;  Location: [place];  Service: Orthopedics;  Laterality: Right;  . arm surgery     . CESAREAN SECTION    . COLONOSCOPY    . I&D EXTREMITY Right 05/08/2014   Procedure: IRRIGATION AND DEBRIDEMENT FOOT;  Surgeon: Newt Minion, MD;  Location: [facility];  Service: Orthopedics;  Laterality: Right;  . MEMBRANE PEEL Right 03/15/2018   Procedure: MEMBRANE PEEL;  Surgeon: Jalene Mullet, MD;  Location: [facility];  Service: Ophthalmology;  Laterality: Right;  . PHOTOCOAGULATION WITH LASER Right 03/15/2018   Procedure: PHOTOCOAGULATION WITH LASER;  Surgeon: Jalene Mullet, MD;  Location: [facility];  Service: Ophthalmology;  Laterality: Right;  . 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      Home Medications    Prior to Admission medications   Medication Sig Start Date End Date Taking? Authorizing Provider  acetaminophen (TYLENOL) 500 MG tablet Take 500-1,000 mg by mouth every 6 (six) hours as needed (pain.).    [provider]  acetaminophen-codeine (TYLENOL #3) 300-30 MG tablet TAKE 1 TABLET BY MOUTH EVERY 6 HOURS AS NEEDED FOR MODERATE PAIN 03/24/18   Lanae Boast, FNP  amLODipine (NORVASC) 10 MG tablet Take 1 tablet (10 mg total) by mouth daily. 08/31/18   Lanae Boast, FNP  atorvastatin (LIPITOR) 10 MG tablet Take 1 tablet (10 mg total) by mouth daily. 11/09/18   Lanae Boast, FNP  Blood Glucose Monitoring Suppl (ACCU-CHEK AVIVA PLUS) w/Device KIT 1 Device by Does not apply route once. Use as directed to check blood sugar 06/11/15   Dorena Dew, FNP  Blood Glucose Monitoring Suppl (ONE TOUCH ULTRA SYSTEM KIT)  w/Device KIT Test three times daily. Dx Type 2 Diabetes. 06/10/15   Dorena Dew, FNP  cephALEXin (KEFLEX) 500 MG capsule Take 1 capsule (500 mg total) by mouth 3 (three) times daily for 7 days. 12/18/18 12/25/18  Sharion Balloon, NP  Continuous Blood Gluc Receiver (FREESTYLE LIBRE 14 DAY READER) DEVI 1 each by Does not apply route 4 (four) times daily -  before meals and at bedtime. 06/30/17   Dorena Dew, FNP  dorzolamide-timolol (COSOPT) 22.3-6.8 MG/ML ophthalmic solution INSTILL 1 DROP INTO RIGHT EYE TWICE DAILY 06/29/18   [provider]  fluticasone (FLONASE) 50 MCG/ACT nasal spray Place 2 sprays into both nostrils daily. 08/31/18   Lanae Boast, FNP  furosemide (LASIX) 20 MG tablet Take 2 tablets (40 mg total) by mouth daily for 7 days, THEN 1 tablet (20 mg total) daily for 21 days. 11/23/18 12/21/18  Zigmund Gottron, NP  gabapentin (NEURONTIN) 300 MG capsule Take 1 capsule by mouth twice daily 10/11/18   Lanae Boast, FNP  glucose blood (ACCU-CHEK AVIVA) test strip Use to check blood sugar 3 times daily and at bedtime. 06/11/15   Dorena Dew, FNP  ibuprofen (ADVIL) 800 MG tablet TAKE 1 TABLET BY MOUTH EVERY 8 HOURS AS NEEDED 10/11/18   Lanae Boast, FNP  Insulin Degludec (TRESIBA FLEXTOUCH) 200 UNIT/ML SOPN Inject 46 Units into the skin daily. 08/31/18   Lanae Boast, FNP  Lancet Devices [facility]) lancets Use as instructed 06/11/15   Dorena Dew, FNP  levocetirizine Harlow Ohms) 5 MG tablet TAKE ONE TABLET BY MOUTH ONCE DAILY IN THE EVENING 05/11/18   Lanae Boast, FNP  nystatin (NYSTATIN) powder Apply topically 4 (four) times daily. 05/11/18   Lanae Boast, FNP  nystatin ointment (MYCOSTATIN) APPLY 1 APPLICATION OF  OINTMENT TOPICALLY TWICE DAILY 11/28/18   Tresa Garter, MD  omeprazole (PRILOSEC) 20 MG capsule Take 1 capsule by mouth once daily 12/01/18   Tresa Garter, MD  ONE TOUCH LANCETS MISC Test 3 times daily with glucometer. Dx Type 2 diabetes  with unspecified complications. 06/10/15   Dorena Dew, FNP  RELION PEN NEEDLE 31G/8MM 31G X 8 MM MISC USE AS DIRECTED 4 TIMES DAILY 12/20/17   Lanae Boast, FNP    Family History Family History  Problem Relation Age of Onset  . Diabetes Mother     Social History Social History   Tobacco Use  . Smoking status: Never Smoker  . Smokeless tobacco: Never Used  Substance Use Topics  . Alcohol use: No  . Drug use: No     Allergies   Tramadol and Adhesive [tape]   Review of Systems Review of Systems  Constitutional: Negative for chills and fever.  HENT: Negative for ear pain and sore throat.   Eyes: Negative for pain and visual disturbance.  Respiratory: Negative for cough and shortness of breath.   Cardiovascular: Positive for leg swelling. Negative for chest pain and palpitations.  Gastrointestinal: Negative for abdominal pain and vomiting.  Genitourinary: Negative for dysuria and hematuria.  Musculoskeletal: Negative for arthralgias and back pain.  Skin: Positive for color change. Negative for rash.  Neurological: Negative for seizures and syncope.  All other systems reviewed and are negative.    Physical Exam Triage Vital Signs ED Triage Vitals [12/18/18 1143]  Enc Vitals Group     BP (!) 184/78     Pulse Rate 88     Resp 18     Temp 97.8 F (36.6 C)     Temp Source Temporal     SpO2 100 %     Weight      Height      Head Circumference      Peak Flow      Pain Score 8     Pain Loc      Pain Edu?      Excl. in [place]?    No data found.  Updated Vital Signs BP (!) 184/78 (BP Location: Left Arm)   Pulse 88   Temp 97.8 F (36.6 C) (Temporal)   Resp 18   SpO2 100%   Visual Acuity Right Eye Distance:   Left Eye Distance:   Bilateral Distance:    Right Eye Near:   Left Eye Near:    Bilateral Near:     Physical Exam Vitals signs and nursing note reviewed.  Constitutional:      General: She is not in acute distress.    Appearance: She is  well-developed.  HENT:     Head: Normocephalic and atraumatic.  Eyes:     Conjunctiva/sclera: Conjunctivae normal.  Neck:     Musculoskeletal: Neck supple.  Cardiovascular:     Rate and Rhythm: Normal rate and regular rhythm.     Heart sounds: No murmur.  Pulmonary:     Effort: Pulmonary effort is normal. No respiratory distress.     Breath sounds: Normal breath sounds.  Abdominal:     Palpations: Abdomen is soft.     Tenderness: There is no abdominal tenderness.  Musculoskeletal:        General: Tenderness present. No swelling.     Right lower leg: Edema present.     Left lower leg: Edema present.     Comments: 2+ pitting edema bilaterally to knee.  Erythematous anterior from mid-shin to ankle.    Skin:    General: Skin is warm and dry.     Findings: Erythema present.  Neurological:     Mental Status: She is alert.      UC Treatments / Results  Labs (all labs ordered are listed, but only abnormal results are displayed) Labs Reviewed - No data to display  EKG   Radiology No results found.  Procedures Procedures (including critical care time)  Medications Ordered in UC Medications - No data to display  Initial Impression / Assessment and Plan / UC Course  I have reviewed the triage vital signs and the nursing notes.  Pertinent labs & imaging results that were available during my care of the patient were reviewed by me and considered in my medical decision making (see chart for details).   Bilateral lower extremity cellulitis.  Treating with cephalexin 3 times daily (renally dosed).  Instructed patient to continue taking the last doses of Lasix and to discuss with her PCP if she should remain on this medication.  Instructed patient to call her PCP first thing in the morning to be seen as soon as possible to discuss her elevated blood pressure  and lower extremity cellulitis/edema.  Patient agrees to plan of care.       Final Clinical Impressions(s) / UC Diagnoses    Final diagnoses:  Cellulitis of lower extremity, unspecified laterality     Discharge Instructions     Take the antibiotic as directed.    Follow-up with your primary care provider in the morning to be seen as soon as possible to discuss your elevated blood pressure and lower leg swelling and redness;   And to discuss if you should remain on the Lasix that was prescribed here at the urgent care on 11/23/2018.  Your blood pressure today was 184/78.        ED Prescriptions    Medication Sig Dispense Auth. Provider   cephALEXin (KEFLEX) 500 MG capsule Take 1 capsule (500 mg total) by mouth 3 (three) times daily for 7 days. 21 capsule Sharion Balloon, NP     PDMP not reviewed this encounter.   Sharion Balloon, NP 12/18/18 [phone]

## 2018-12-18 NOTE — Discharge Instructions (Signed)
Take the antibiotic as directed.    Follow-up with your primary care provider in the morning to be seen as soon as possible to discuss your elevated blood pressure and lower leg swelling and redness;   And to discuss if you should remain on the Lasix that was prescribed here at the urgent care on 11/23/2018.  Your blood pressure today was 184/78.

## 2018-12-18 NOTE — ED Triage Notes (Signed)
Pt here with bilateral leg pain and swelling; some redness noted; pt seen here recently for same

## 2019-01-16 ENCOUNTER — Other Ambulatory Visit: Payer: Self-pay | Admitting: Family Medicine

## 2019-01-16 DIAGNOSIS — E1165 Type 2 diabetes mellitus with hyperglycemia: Secondary | ICD-10-CM

## 2019-01-17 ENCOUNTER — Ambulatory Visit: Payer: Medicare HMO | Admitting: Podiatry

## 2019-02-14 ENCOUNTER — Ambulatory Visit: Payer: Medicare HMO | Admitting: Family Medicine

## 2019-02-17 ENCOUNTER — Other Ambulatory Visit: Payer: Self-pay

## 2019-02-17 ENCOUNTER — Encounter (HOSPITAL_COMMUNITY): Payer: Self-pay

## 2019-02-17 ENCOUNTER — Ambulatory Visit (HOSPITAL_COMMUNITY)
Admission: EM | Admit: 2019-02-17 | Discharge: 2019-02-17 | Disposition: A | Discharge status: Home / Self Care | Payer: Medicare HMO | Attending: Family Medicine | Admitting: Family Medicine

## 2019-02-17 DIAGNOSIS — M791 Myalgia, unspecified site: Secondary | ICD-10-CM | POA: Diagnosis not present

## 2019-02-17 DIAGNOSIS — R05 Cough: Secondary | ICD-10-CM | POA: Diagnosis not present

## 2019-02-17 DIAGNOSIS — U071 COVID-19: Secondary | ICD-10-CM | POA: Diagnosis not present

## 2019-02-17 DIAGNOSIS — L03116 Cellulitis of left lower limb: Secondary | ICD-10-CM

## 2019-02-17 LAB — POC SARS CORONAVIRUS 2 AG: SARS Coronavirus 2 Ag: POSITIVE — AB

## 2019-02-17 LAB — POC SARS CORONAVIRUS 2 AG -  ED: SARS Coronavirus 2 Ag: POSITIVE — AB

## 2019-02-17 MED ORDER — CEPHALEXIN 500 MG PO CAPS: 500.0000 mg | ORAL_CAPSULE | Freq: Four times a day (QID) | ORAL | 0 refills | Status: AC

## 2019-02-17 MED ORDER — ACETAMINOPHEN 325 MG PO TABS
650.0000 mg | ORAL_TABLET | Freq: Once | ORAL | Status: AC
  Administered 2019-02-17: 17:00:00 650 mg via ORAL

## 2019-02-17 MED ORDER — ACETAMINOPHEN 325 MG PO TABS
ORAL_TABLET | ORAL | Status: AC
  Filled 2019-02-17: qty 2

## 2019-02-17 NOTE — Discharge Instructions (Signed)
Your rapid covid test today is positive for Covid-19 virus.  This is a viral illness which does not have a specific treatment for, unfortunately.  Push fluids to ensure adequate hydration and keep secretions thin.  Tylenol as needed for pain or fevers.  You may try some vitamins to help your immune system potentially:  Vitamin C 500mg  twice a day. Zing 50mg  daily. Vitamin D 5000IU daily.   Rest.  Per CDC guidelines you should self isolate for 10 days from the onset of symptoms, and may return to work after 10 days of isolation as long as symptoms have began to improve and you have not had a fever for 3 days.  If develop any worsening of shortness of breath , chest pain , or difficulty breathing please seek further evaluation in the ER.

## 2019-02-17 NOTE — ED Triage Notes (Signed)
Pt presents to the UC with body aches, cough, chills and  headache x 4 days. Pt reports she is taking ibuprofen and Tylenol without relief.

## 2019-02-17 NOTE — ED Provider Notes (Signed)
[facility]    CSN: [id number] Arrival date & time: 02/17/19  1647      History   Chief Complaint Chief Complaint  Patient presents with  . Generalized Body Aches  . Headache  . Cough  . Chills    HPI Rachel Gonzalez is a 68 y.o. female.   Rachel Gonzalez presents with complaints of headache, body aches, cough, runny nose. No nausea vomiting or diarrhea. Denies any previous fevers or chills. No sore throat. Cough is non productive. No loss of taste or smell. No chest pain  Or shortness of breath . Symptoms started approximately 12/7. No known ill contacts. Doesn't work, she isn't necessarily around others regularly. Hasn't taken any medications for symptoms. Sometimes short of breath with activity since last night. Also complaints of left lower leg pain and swelling. Has had issues with cellulitis in the past and this feels similar. She doesn't wear compression. Was treated for cellulitis last 12/18/2018. No numbness tingling or weakness. No injury to the leg. It is tender. History  Of arthritis, DM, gerd, htn, ckd.     ROS per HPI, negative if not otherwise mentioned.      Past Medical History:  Diagnosis Date  . Anemia   . Arthritis    right finger  . Diabetes mellitus   . GERD (gastroesophageal reflux disease)   . Hypertension   . Renal disorder     Patient Active Problem List   Diagnosis Date Noted  . Stage 3 chronic kidney disease 12/01/2016  . Frequent falls 04/28/2016  . Right arm weakness 04/28/2016  . Bilateral lower extremity edema 04/28/2016  . Screening for colon cancer 04/28/2016  . Abnormal urinalysis 09/13/2014  . Chest pain 09/13/2014  . Abdominal pain, recurrent 09/13/2014  . Dehydration with hyponatremia 09/13/2014  . Acute hyperkalemia 09/13/2014  . Acute renal failure superimposed on stage 3 chronic kidney disease ([place]) 09/13/2014  . Anemia 05/06/2014  . Diabetic neuropathy, type II diabetes mellitus ([place]) 05/06/2014  . Obesity  (BMI 30-39.9) 05/06/2014  . Cellulitis of right foot 05/06/2014  . Essential hypertension 03/07/2011  . 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 Right arm pain 08/04/2006  . Osteoarthritis 02/14/2006  . Uncontrolled type 2 diabetes mellitus with hyperglycemia ([place]) 01/11/2006    Past Surgical History:  Procedure Laterality Date  . AMPUTATION Right 05/08/2014   Procedure: AMPUTATION RAY, right great toe;  Surgeon: Newt Minion, MD;  Location: [facility];  Service: Orthopedics;  Laterality: Right;  . arm surgery     . CESAREAN SECTION    . COLONOSCOPY    . I&D EXTREMITY Right 05/08/2014   Procedure: IRRIGATION AND DEBRIDEMENT FOOT;  Surgeon: Newt Minion, MD;  Location: [facility];  Service: Orthopedics;  Laterality: Right;  . MEMBRANE PEEL Right 03/15/2018   Procedure: MEMBRANE PEEL;  Surgeon: Jalene Mullet, MD;  Location: [facility];  Service: Ophthalmology;  Laterality: Right;  . PHOTOCOAGULATION WITH LASER Right 03/15/2018   Procedure: PHOTOCOAGULATION WITH LASER;  Surgeon: Jalene Mullet, MD;  Location: [facility];  Service: Ophthalmology;  Laterality: Right;  . 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      Home Medications    Prior to Admission  medications   Medication Sig Start Date End Date Taking? Authorizing Provider  acetaminophen (TYLENOL) 500 MG tablet Take 500-1,000 mg by mouth every 6 (six) hours as needed (pain.).    [provider]  acetaminophen-codeine (TYLENOL #3) 300-30 MG tablet TAKE 1 TABLET BY MOUTH EVERY 6 HOURS AS NEEDED FOR MODERATE PAIN 03/24/18   Lanae Boast, FNP  amLODipine (NORVASC) 10 MG tablet Take 1 tablet (10 mg total) by mouth daily. 08/31/18   Lanae Boast, FNP    atorvastatin (LIPITOR) 10 MG tablet Take 1 tablet (10 mg total) by mouth daily. 11/09/18   Lanae Boast, FNP  Blood Glucose Monitoring Suppl (ACCU-CHEK AVIVA PLUS) w/Device KIT 1 Device by Does not apply route once. Use as directed to check blood sugar 06/11/15   Dorena Dew, FNP  Blood Glucose Monitoring Suppl (ONE TOUCH ULTRA SYSTEM KIT) w/Device KIT Test three times daily. Dx Type 2 Diabetes. 06/10/15   Dorena Dew, FNP  brimonidine (ALPHAGAN) 0.2 % ophthalmic solution Place 1 drop into the right eye 2 (two) times daily. 01/16/19   [provider]  Continuous Blood Gluc Receiver (FREESTYLE LIBRE 14 DAY READER) DEVI 1 each by Does not apply route 4 (four) times daily -  before meals and at bedtime. 06/30/17   Dorena Dew, FNP  dorzolamide-timolol (COSOPT) 22.3-6.8 MG/ML ophthalmic solution INSTILL 1 DROP INTO RIGHT EYE TWICE DAILY 06/29/18   [provider]  fluticasone (FLONASE) 50 MCG/ACT nasal spray Place 2 sprays into both nostrils daily. 08/31/18   Lanae Boast, FNP  furosemide (LASIX) 20 MG tablet Take 2 tablets (40 mg total) by mouth daily for 7 days, THEN 1 tablet (20 mg total) daily for 21 days. 11/23/18 12/21/18  Zigmund Gottron, NP  gabapentin (NEURONTIN) 300 MG capsule Take 1 capsule by mouth twice daily 01/16/19   Angelica Chessman E, MD  glucose blood (ACCU-CHEK AVIVA) test strip Use to check blood sugar 3 times daily and at bedtime. 06/11/15   Dorena Dew, FNP  ibuprofen (ADVIL) 800 MG tablet TAKE 1 TABLET BY MOUTH EVERY 8 HOURS AS NEEDED 01/16/19   Tresa Garter, MD  Insulin Degludec (TRESIBA FLEXTOUCH) 200 UNIT/ML SOPN Inject 46 Units into the skin daily. 08/31/18   Lanae Boast, FNP  Lancet Devices [facility]) lancets Use as instructed 06/11/15   Dorena Dew, FNP  levocetirizine [facility]) 5 MG tablet TAKE ONE TABLET BY MOUTH ONCE DAILY IN THE EVENING 05/11/18   Lanae Boast, FNP  nystatin (NYSTATIN) powder Apply topically 4  (four) times daily. 05/11/18   Lanae Boast, FNP  nystatin ointment (MYCOSTATIN) APPLY 1 APPLICATION OF  OINTMENT TOPICALLY TWICE DAILY 11/28/18   Tresa Garter, MD  omeprazole (PRILOSEC) 20 MG capsule Take 1 capsule by mouth once daily 01/16/19   Tresa Garter, MD  ONE TOUCH LANCETS MISC Test 3 times daily with glucometer. Dx Type 2 diabetes with unspecified complications. 06/10/15   Dorena Dew, FNP  RELION PEN NEEDLE 31G/8MM 31G X 8 MM MISC USE AS DIRECTED 4 TIMES DAILY 12/20/17   Lanae Boast, FNP    Family History Family History  Problem Relation Age of Onset  . Diabetes Mother     Social History Social History   Tobacco Use  . Smoking status: Never Smoker  . Smokeless tobacco: Never Used  Substance Use Topics  . Alcohol use: No  . Drug use: No     Allergies   Tramadol and Adhesive [tape]  Review of Systems Review of Systems   Physical Exam Triage Vital Signs ED Triage Vitals  Enc Vitals Group     BP 02/17/19 1702 (!) 183/81     Pulse Rate 02/17/19 1702 84     Resp 02/17/19 1702 18     Temp 02/17/19 1702 (!) 101 F (38.3 C)     Temp Source 02/17/19 1702 Oral     SpO2 02/17/19 1702 95 %     Weight --      Height --      Head Circumference --      Peak Flow --      Pain Score 02/17/19 1659 8     Pain Loc --      Pain Edu? --      Excl. in [place]? --    No data found.  Updated Vital Signs BP (!) 183/81 (BP Location: Left Arm)   Pulse 84   Temp (!) 101 F (38.3 C) (Oral)   Resp 18   SpO2 95%   Visual Acuity Right Eye Distance:   Left Eye Distance:   Bilateral Distance:    Right Eye Near:   Left Eye Near:    Bilateral Near:     Physical Exam Constitutional:      General: She is not in acute distress.    Appearance: She is well-developed.  Cardiovascular:     Rate and Rhythm: Normal rate.  Pulmonary:     Effort: Pulmonary effort is normal.  Musculoskeletal:     Right lower leg: Swelling present.     Left lower leg:  Swelling present. Edema present.     Comments: Redness swelling and tenderness circumferentially to left lower leg with pitting edema; mildly warm; no calf pain; gross sensation intact to foot and lower leg; right leg with mild swelling and with small ulcer noted to distal calf   Skin:    General: Skin is warm and dry.  Neurological:     Mental Status: She is alert and oriented to person, place, and time.      UC Treatments / Results  Labs (all labs ordered are listed, but only abnormal results are displayed) Labs Reviewed  POC SARS CORONAVIRUS 2 AG -  ED - Abnormal; Notable for the following components:      Result Value   SARS Coronavirus 2 Ag POSITIVE (*)    All other components within normal limits  POC SARS CORONAVIRUS 2 AG - Abnormal; Notable for the following components:   SARS Coronavirus 2 Ag POSITIVE (*)    All other components within normal limits    EKG   Radiology No results found.  Procedures Procedures (including critical care time)  Medications Ordered in UC Medications  acetaminophen (TYLENOL) tablet 650 mg (650 mg Oral Given 02/17/19 1722)    Initial Impression / Assessment and Plan / UC Course  I have reviewed the triage vital signs and the nursing notes.  Pertinent labs & imaging results that were available during my care of the patient were reviewed by me and considered in my medical decision making (see chart for details).     Patient is positive for covid-19 tonight by rapid testing. After discussion about this patient questions "so I have a touch of covid?" Reemphasized results and strict precautions, as patient is high risk related to comorbidities. No work of breathing, no tachycardia or hypoxia noted tonight. Tylenol provided before departure. Keflex restarted for lower extremity edema, recommended IM rocephin but patient  declines. Strict ER precautions discussed. Patient verbalized understanding and agreeable to plan, although I remain concerned  about her understanding of information provided.  Ambulatory out of clinic without difficulty.    Final Clinical Impressions(s) / UC Diagnoses   Final diagnoses:  [id number] virus infection     Discharge Instructions     Your rapid covid test today is positive for Covid-19 virus.  This is a viral illness which does not have a specific treatment for, unfortunately.  Push fluids to ensure adequate hydration and keep secretions thin.  Tylenol as needed for pain or fevers.  You may try some vitamins to help your immune system potentially:  Vitamin C 562m twice a day. Zing 558mdaily. Vitamin D 5000IU daily.   Rest.  Per CDC guidelines you should self isolate for 10 days from the onset of symptoms, and may return to work after 10 days of isolation as long as symptoms have began to improve and you have not had a fever for 3 days.  If develop any worsening of shortness of breath , chest pain , or difficulty breathing please seek further evaluation in the ER.     ED Prescriptions    None     PDMP not reviewed this encounter.   BuZigmund GottronNP 02/17/19 [phone]

## 2019-02-21 ENCOUNTER — Ambulatory Visit: Payer: Medicare HMO | Admitting: Family Medicine

## 2019-02-21 ENCOUNTER — Ambulatory Visit: Payer: Medicare HMO | Admitting: Podiatry

## 2019-02-23 ENCOUNTER — Emergency Department (HOSPITAL_COMMUNITY): Payer: Medicare HMO

## 2019-02-23 ENCOUNTER — Emergency Department (HOSPITAL_COMMUNITY)
Admission: EM | Admit: 2019-02-23 | Discharge: 2019-02-23 | Disposition: A | Discharge status: Home / Self Care | Payer: Medicare HMO | Attending: Emergency Medicine | Admitting: Emergency Medicine

## 2019-02-23 DIAGNOSIS — R531 Weakness: Secondary | ICD-10-CM | POA: Diagnosis not present

## 2019-02-23 DIAGNOSIS — Z794 Long term (current) use of insulin: Secondary | ICD-10-CM | POA: Insufficient documentation

## 2019-02-23 DIAGNOSIS — U071 COVID-19: Secondary | ICD-10-CM | POA: Diagnosis not present

## 2019-02-23 DIAGNOSIS — R918 Other nonspecific abnormal finding of lung field: Secondary | ICD-10-CM | POA: Diagnosis not present

## 2019-02-23 DIAGNOSIS — Z79899 Other long term (current) drug therapy: Secondary | ICD-10-CM | POA: Insufficient documentation

## 2019-02-23 DIAGNOSIS — R0902 Hypoxemia: Secondary | ICD-10-CM | POA: Diagnosis not present

## 2019-02-23 DIAGNOSIS — N183 Chronic kidney disease, stage 3 unspecified: Secondary | ICD-10-CM | POA: Insufficient documentation

## 2019-02-23 DIAGNOSIS — E1122 Type 2 diabetes mellitus with diabetic chronic kidney disease: Secondary | ICD-10-CM | POA: Insufficient documentation

## 2019-02-23 DIAGNOSIS — I129 Hypertensive chronic kidney disease with stage 1 through stage 4 chronic kidney disease, or unspecified chronic kidney disease: Secondary | ICD-10-CM | POA: Diagnosis not present

## 2019-02-23 DIAGNOSIS — I10 Essential (primary) hypertension: Secondary | ICD-10-CM | POA: Diagnosis not present

## 2019-02-23 DIAGNOSIS — E1165 Type 2 diabetes mellitus with hyperglycemia: Secondary | ICD-10-CM | POA: Diagnosis not present

## 2019-02-23 LAB — CBC WITH DIFFERENTIAL/PLATELET
Abs Immature Granulocytes: 0 10*3/uL (ref 0.00–0.07)
Basophils Absolute: 0.1 10*3/uL (ref 0.0–0.1)
Basophils Relative: 1 %
Eosinophils Absolute: 0 10*3/uL (ref 0.0–0.5)
Eosinophils Relative: 0 %
HCT: 34.9 % — ABNORMAL LOW (ref 36.0–46.0)
Hemoglobin: 11.3 g/dL — ABNORMAL LOW (ref 12.0–15.0)
Lymphocytes Relative: 21 %
Lymphs Abs: 1.2 10*3/uL (ref 0.7–4.0)
MCH: 27.1 pg (ref 26.0–34.0)
MCHC: 32.4 g/dL (ref 30.0–36.0)
MCV: 83.7 fL (ref 80.0–100.0)
Monocytes Absolute: 0.4 10*3/uL (ref 0.1–1.0)
Monocytes Relative: 7 %
Neutro Abs: 4 10*3/uL (ref 1.7–7.7)
Neutrophils Relative %: 71 %
Platelets: 308 10*3/uL (ref 150–400)
RBC: 4.17 MIL/uL (ref 3.87–5.11)
RDW: 13.2 % (ref 11.5–15.5)
WBC: 5.7 10*3/uL (ref 4.0–10.5)
nRBC: 0 % (ref 0.0–0.2)
nRBC: 0 /100 WBC

## 2019-02-23 LAB — BASIC METABOLIC PANEL
Anion gap: 11 (ref 5–15)
BUN: 23 mg/dL (ref 8–23)
CO2: 24 mmol/L (ref 22–32)
Calcium: 8.7 mg/dL — ABNORMAL LOW (ref 8.9–10.3)
Chloride: 100 mmol/L (ref 98–111)
Creatinine, Ser: 1.72 mg/dL — ABNORMAL HIGH (ref 0.44–1.00)
GFR calc Af Amer: 35 mL/min — ABNORMAL LOW (ref >60)
GFR calc non Af Amer: 30 mL/min — ABNORMAL LOW (ref >60)
Glucose, Bld: 438 mg/dL — ABNORMAL HIGH (ref 70–99)
Potassium: 4.1 mmol/L (ref 3.5–5.1)
Sodium: 135 mmol/L (ref 135–145)

## 2019-02-23 LAB — HEPATIC FUNCTION PANEL
ALT: 11 U/L (ref 0–44)
AST: 15 U/L (ref 15–41)
Albumin: 2.6 g/dL — ABNORMAL LOW (ref 3.5–5.0)
Alkaline Phosphatase: 104 U/L (ref 38–126)
Bilirubin, Direct: 0.2 mg/dL (ref 0.0–0.2)
Indirect Bilirubin: 0.6 mg/dL (ref 0.3–0.9)
Total Bilirubin: 0.8 mg/dL (ref 0.3–1.2)
Total Protein: 7.3 g/dL (ref 6.5–8.1)

## 2019-02-23 LAB — CBG MONITORING, ED: Glucose-Capillary: 389 mg/dL — ABNORMAL HIGH (ref 70–99)

## 2019-02-23 MED ORDER — SODIUM CHLORIDE 0.9 % IV BOLUS
1000.0000 mL | Freq: Once | INTRAVENOUS | Status: AC
  Administered 2019-02-23: 1000 mL via INTRAVENOUS

## 2019-02-23 MED ORDER — ACETAMINOPHEN 325 MG PO TABS
650.0000 mg | ORAL_TABLET | Freq: Once | ORAL | Status: AC
  Administered 2019-02-23: 650 mg via ORAL
  Filled 2019-02-23: qty 2

## 2019-02-23 MED ORDER — AZITHROMYCIN 250 MG PO TABS
500.0000 mg | ORAL_TABLET | Freq: Once | ORAL | Status: AC
  Administered 2019-02-23: 500 mg via ORAL
  Filled 2019-02-23: qty 2

## 2019-02-23 MED ORDER — AMLODIPINE BESYLATE 5 MG PO TABS
10.0000 mg | ORAL_TABLET | Freq: Once | ORAL | Status: AC
  Administered 2019-02-23: 10 mg via ORAL
  Filled 2019-02-23: qty 2

## 2019-02-23 MED ORDER — AZITHROMYCIN 250 MG PO TABS: 250.0000 mg | ORAL_TABLET | Freq: Every day | ORAL | 0 refills | Status: AC

## 2019-02-23 NOTE — Discharge Instructions (Addendum)
Thank you for allowing [place] to care for you today.   -It is very important that you take your blood pressure medications and your diabetes medicines.  -Prescription has been sent to your pharmacy for azithromycin.  This is an antibiotic to cover you for your Covid pneumonia.  Please return to the emergency department if you have any new or worsening symptoms.   Medications- You can take medications to help treat your symptoms: -Tylenol for fever and body aches. Please take as prescribed on the bottle. -Over the coutner cough medicine such as mucinex, robitussin, or other brands. -Flonase or saline nasal spray for nasal congestion -Vitamins as recommended by CDC  Treatment- This is a virus and unfortunately there are no antibitotics approved to treat this virus at this time. It is important to monitor your symptoms closely: -You should have a theremometer at home to check your temperature when feeling feverish. -Use a pulse ox meter to measure your oxygen when feeling short of breath.  -If your fever is over 100.4 despite taking tylenol or if your oxygen level drops below 94% these are reasons to rturn to the emergency department for further evaluation. Please call the emergency department before you come to make [place] aware.    We recommend you self-isolate for 10 days and to inform your work/family/friends that you has the virus.  They will need to self-quarantine for 14 days to monitor for symptoms.    Again: symptoms of shortness of breath, chest pain, difficulty breathing, new onset of confuison, any symptoms that are concerning. If any of these symptoms you should come to emergency department for evaluation.   I hope you feel better soon

## 2019-02-23 NOTE — ED Triage Notes (Signed)
Pt presents from home for weakness r/t covid positive status (tested + 12/6). Denies SOB, CP, HA. When EMS arrived, pt also stated concern about glucometer being broken, CBG 531; BP 220/, not taking  new meds prescribed 11/28. 94% RA.  Upon arrival to exam room, RN notes pt appears confused, stops responding to questions mid conversation and just sits. Oriented otherwise. Walk test performed in room, remained 94% or greater during this activity.

## 2019-02-23 NOTE — ED Notes (Signed)
Patient verbalizes understanding of discharge instructions. Opportunity for questioning and answers were provided. Armband removed by staff, pt discharged from ED to home via POV  

## 2019-02-23 NOTE — ED Notes (Signed)
Pt states " I just can't remember if I have taken my medication or not. Sometimes, I can't even remember if I went to bed or not. My husband is in the hospital [with covid]. I just wish that someone were there to take care of me and feed me"

## 2019-02-23 NOTE — ED Notes (Signed)
Pt reports that grandson is at work and locked home, unable to get home or get into home, encouraged pt to call other family. Doing this now.

## 2019-02-23 NOTE — ED Provider Notes (Signed)
[facility] EMERGENCY DEPARTMENT Provider Note   CSN: [id number] Arrival date & time: 02/23/19  1315     History Chief Complaint  Patient presents with   Weakness    Rachel Gonzalez is a 68 y.o. female with past medical history significant for anemia, arthritis, diabetes, hypertension, stage III CKD presents emergency department today with chief complaint of weakness.  She was diagnosed with Covid on 02/12/2019 at urgent care.  This is day 11 of Covid illness.  She states she has had generalized weakness since her diagnosis.  She has had decreased p.o. intake and loss of appetite.  She has not been checking her blood sugar the last week because her glucometer was broken. Her son is getting her a new glucometer. She also reports she usually takes her insulin but forgot to last night.  She is endorsing productive cough with white phlegm.  Also admits to subjective fevers and chills at home. She has headache currently. She reports it has progressively worsened since  She denies any chest pain, chest pain, abdominal pain, nausea, vomiting, urinary symptoms, diarrhea, lower extremity edema, weakness, numbness, dizziness.Marland Kitchen History provided by patient with additional history obtained from chart review.      Past Medical History:  Diagnosis Date   Anemia    Arthritis    right finger   Diabetes mellitus    GERD (gastroesophageal reflux disease)    Hypertension    Renal disorder     Patient Active Problem List   Diagnosis Date Noted   Stage 3 chronic kidney disease 12/01/2016   Frequent falls 04/28/2016   Right arm weakness 04/28/2016   Bilateral lower extremity edema 04/28/2016   Screening for colon cancer 04/28/2016   Abnormal urinalysis 09/13/2014   Chest pain 09/13/2014   Abdominal pain, recurrent 09/13/2014   Dehydration with hyponatremia 09/13/2014   Acute hyperkalemia 09/13/2014   Acute renal failure superimposed on stage 3 chronic kidney  disease ([place]) 09/13/2014   Anemia 05/06/2014   Diabetic neuropathy, type II diabetes mellitus ([place]) 05/06/2014   Obesity (BMI 30-39.9) 05/06/2014   Cellulitis of right foot 05/06/2014   Essential hypertension 03/07/2011   PERS HX NONCOMPLIANCE W/MED TX PRS HAZARDS HLTH 02/07/2009   Right arm pain 08/04/2006   Osteoarthritis 02/14/2006   Uncontrolled type 2 diabetes mellitus with hyperglycemia ([place]) 01/11/2006    Past Surgical History:  Procedure Laterality Date   AMPUTATION Right 05/08/2014   Procedure: AMPUTATION RAY, right great toe;  Surgeon: Newt Minion, MD;  Location: [facility];  Service: Orthopedics;  Laterality: Right;   arm surgery      CESAREAN SECTION     COLONOSCOPY     I & D EXTREMITY Right 05/08/2014   Procedure: IRRIGATION AND DEBRIDEMENT FOOT;  Surgeon: Newt Minion, MD;  Location: [facility];  Service: Orthopedics;  Laterality: Right;   MEMBRANE PEEL Right 03/15/2018   Procedure: MEMBRANE PEEL;  Surgeon: Jalene Mullet, MD;  Location: [facility];  Service: Ophthalmology;  Laterality: Right;   PHOTOCOAGULATION WITH LASER Right 03/15/2018   Procedure: PHOTOCOAGULATION WITH LASER;  Surgeon: Jalene Mullet, MD;  Location: [facility];  Service: Ophthalmology;  Laterality: Right;   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  Family History  Problem Relation Age of Onset   Diabetes Mother     Social History   Tobacco Use   Smoking status: Never Smoker   Smokeless tobacco: Never Used  Substance Use Topics   Alcohol use: No   Drug use: No    Home Medications Prior to Admission medications   Medication Sig Start Date End Date Taking? Authorizing Provider  acetaminophen (TYLENOL) 500  MG tablet Take 500-1,000 mg by mouth every 6 (six) hours as needed (pain.).    [provider]  acetaminophen-codeine (TYLENOL #3) 300-30 MG tablet TAKE 1 TABLET BY MOUTH EVERY 6 HOURS AS NEEDED FOR MODERATE PAIN 03/24/18   Lanae Boast, FNP  amLODipine (NORVASC) 10 MG tablet Take 1 tablet (10 mg total) by mouth daily. 08/31/18   Lanae Boast, FNP  atorvastatin (LIPITOR) 10 MG tablet Take 1 tablet (10 mg total) by mouth daily. 11/09/18   Lanae Boast, FNP  azithromycin (ZITHROMAX Z-PAK) 250 MG tablet Take 1 tablet (250 mg total) by mouth daily for 4 days. 02/23/19 02/27/19  Soni Kegel E, PA-C  Blood Glucose Monitoring Suppl (ACCU-CHEK AVIVA PLUS) w/Device KIT 1 Device by Does not apply route once. Use as directed to check blood sugar 06/11/15   Dorena Dew, FNP  Blood Glucose Monitoring Suppl (ONE TOUCH ULTRA SYSTEM KIT) w/Device KIT Test three times daily. Dx Type 2 Diabetes. 06/10/15   Dorena Dew, FNP  brimonidine (ALPHAGAN) 0.2 % ophthalmic solution Place 1 drop into the right eye 2 (two) times daily. 01/16/19   [provider]  cephALEXin (KEFLEX) 500 MG capsule Take 1 capsule (500 mg total) by mouth 4 (four) times daily for 7 days. 02/17/19 02/24/19  Zigmund Gottron, NP  Continuous Blood Gluc Receiver (FREESTYLE LIBRE 14 DAY READER) DEVI 1 each by Does not apply route 4 (four) times daily -  before meals and at bedtime. 06/30/17   Dorena Dew, FNP  dorzolamide-timolol (COSOPT) 22.3-6.8 MG/ML ophthalmic solution INSTILL 1 DROP INTO RIGHT EYE TWICE DAILY 06/29/18   [provider]  fluticasone (FLONASE) 50 MCG/ACT nasal spray Place 2 sprays into both nostrils daily. 08/31/18   Lanae Boast, FNP  furosemide (LASIX) 20 MG tablet Take 2 tablets (40 mg total) by mouth daily for 7 days, THEN 1 tablet (20 mg total) daily for 21 days. 11/23/18 12/21/18  Zigmund Gottron, NP  gabapentin (NEURONTIN) 300 MG capsule Take 1 capsule by mouth twice daily 01/16/19    Angelica Chessman E, MD  glucose blood (ACCU-CHEK AVIVA) test strip Use to check blood sugar 3 times daily and at bedtime. 06/11/15   Dorena Dew, FNP  ibuprofen (ADVIL) 800 MG tablet TAKE 1 TABLET BY MOUTH EVERY 8 HOURS AS NEEDED 01/16/19   Tresa Garter, MD  Insulin Degludec (TRESIBA FLEXTOUCH) 200 UNIT/ML SOPN Inject 46 Units into the skin daily. 08/31/18   Lanae Boast, FNP  Lancet Devices [facility]) lancets Use as instructed 06/11/15   Dorena Dew, FNP  levocetirizine [facility]) 5 MG tablet TAKE ONE TABLET BY MOUTH ONCE DAILY IN THE EVENING 05/11/18   Lanae Boast, FNP  nystatin (NYSTATIN) powder Apply topically 4 (four) times daily. 05/11/18   Lanae Boast, FNP  nystatin ointment (MYCOSTATIN) APPLY 1 APPLICATION OF  OINTMENT TOPICALLY TWICE DAILY 11/28/18   Tresa Garter, MD  omeprazole (PRILOSEC) 20 MG capsule Take 1 capsule by mouth once daily 01/16/19   Tresa Garter, MD  ONE [facility] LANCETS MISC Test  3 times daily with glucometer. Dx Type 2 diabetes with unspecified complications. 06/10/15   Dorena Dew, FNP  RELION PEN NEEDLE 31G/8MM 31G X 8 MM MISC USE AS DIRECTED 4 TIMES DAILY 12/20/17   Lanae Boast, FNP    Allergies    Tramadol and Adhesive [tape]  Review of Systems   Review of Systems  All other systems are reviewed and are negative for acute change except as noted in the HPI.   Physical Exam Updated Vital Signs BP (!) 175/73 (BP Location: Right Arm)    Pulse 89    Temp 98.7 F (37.1 C) (Oral)    Resp 13    SpO2 100%   Physical Exam Vitals and nursing note reviewed.  Constitutional:      General: She is not in acute distress.    Appearance: She is not ill-appearing.  HENT:     Head: Normocephalic and atraumatic.     Comments: No sinus or temporal tenderness.    Right Ear: Tympanic membrane and external ear normal.     Left Ear: Tympanic membrane and external ear normal.     Nose: Nose normal.     Mouth/Throat:     Mouth:  Mucous membranes are dry.     Pharynx: Oropharynx is clear.  Eyes:     General: No scleral icterus.       Right eye: No discharge.        Left eye: No discharge.     Extraocular Movements: Extraocular movements intact.     Conjunctiva/sclera: Conjunctivae normal.     Pupils: Pupils are equal, round, and reactive to light.  Neck:     Vascular: No JVD.  Cardiovascular:     Rate and Rhythm: Normal rate and regular rhythm.     Pulses: Normal pulses.          Radial pulses are 2+ on the right side and 2+ on the left side.     Heart sounds: Normal heart sounds.  Pulmonary:     Comments: Lungs clear to auscultation in all fields. Symmetric chest rise. No wheezing, rales, or rhonchi. Abdominal:     Comments: Abdomen is soft, non-distended, and non-tender in all quadrants. No rigidity, no guarding. No peritoneal signs.  Musculoskeletal:        General: Normal range of motion.     Cervical back: Normal range of motion.     Right lower leg: No edema.     Left lower leg: No edema.  Feet:     Comments: Right foot with amputation of great toe. Skin:    General: Skin is warm and dry.     Capillary Refill: Capillary refill takes less than 2 seconds.     Comments: No wounds or diabetic ulcers noted to bilateral feet. No signs of swelling or cellulitis.  Neurological:     Mental Status: She is oriented to person, place, and time.     GCS: GCS eye subscore is 4. GCS verbal subscore is 5. GCS motor subscore is 6.     Comments: Speech is clear and goal oriented, follows commands CN III-XII intact, no facial droop Normal strength in upper and lower extremities bilaterally including dorsiflexion and plantar flexion, strong and equal grip strength Sensation normal to light and sharp touch Moves extremities without ataxia, coordination intact Normal finger to nose and rapid alternating movements Normal gait and balance   Psychiatric:        Behavior: Behavior normal.  ED Results / Procedures  / Treatments   Labs (all labs ordered are listed, but only abnormal results are displayed) Labs Reviewed  CBC WITH DIFFERENTIAL/PLATELET - Abnormal; Notable for the following components:      Result Value   Hemoglobin 11.3 (*)    HCT 34.9 (*)    All other components within normal limits  BASIC METABOLIC PANEL - Abnormal; Notable for the following components:   Glucose, Bld 438 (*)    Creatinine, Ser 1.72 (*)    Calcium 8.7 (*)    GFR calc non Af Amer 30 (*)    GFR calc Af Amer 35 (*)    All other components within normal limits  HEPATIC FUNCTION PANEL - Abnormal; Notable for the following components:   Albumin 2.6 (*)    All other components within normal limits  CBG MONITORING, ED - Abnormal; Notable for the following components:   Glucose-Capillary 389 (*)    All other components within normal limits    EKG EKG Interpretation  Date/Time:  Thursday February 23 2019 13:25:27 EST Ventricular Rate:  84 PR Interval:    QRS Duration: 161 QT Interval:  380 QTC Calculation: 450 R Axis:   56 Text Interpretation: Sinus rhythm Probable left ventricular hypertrophy Baseline wander in lead(s) V2 since last tracing no significant change Confirmed by Daleen Bo [phone]) on 02/23/2019 1:44:23 PM   Radiology DG Chest Portable 1 View  Result Date: 02/23/2019 CLINICAL DATA:  COVID. EXAM: PORTABLE CHEST 1 VIEW COMPARISON:  Chest x-ray dated November 23, 2018. FINDINGS: The heart size and mediastinal contours are within normal limits. Normal pulmonary vascularity. Patchy peripheral asymmetric opacities in the right greater than left lungs. No pleural effusion or pneumothorax. No acute osseous abnormality. IMPRESSION: Patchy peripheral asymmetric opacities in the right greater than left lungs, consistent with COVID-19 pneumonia. Electronically Signed   By: Titus Dubin M.D.   On: 02/23/2019 14:36    Procedures Procedures (including critical care time)  Medications Ordered in  ED Medications  acetaminophen (TYLENOL) tablet 650 mg (650 mg Oral Given 02/23/19 1427)  sodium chloride 0.9 % bolus 1,000 mL (0 mLs Intravenous Stopped 02/23/19 1657)  amLODipine (NORVASC) tablet 10 mg (10 mg Oral Given 02/23/19 1655)  azithromycin (ZITHROMAX) tablet 500 mg (500 mg Oral Given 02/23/19 1655)    ED Course  I have reviewed the triage vital signs and the nursing notes.  Pertinent labs & imaging results that were available during my care of the patient were reviewed by me and considered in my medical decision making (see chart for details).  Clinical Course as of Feb 22 1922  Thu Feb 23, 2019  1407 Normal except hemoglobin low  CBC with Differential(!) [EW]  1448 Normal except albumin low  Hepatic function panel(!) [EW]  1448 Normal except glucose high, creatinine high, calcium low, GFR low  Basic metabolic panel(!) [EW]    Clinical Course User Index [EW] Daleen Bo, MD   MDM Rules/Calculators/A&P                      Patient seen and examined.  She is afebrile, normotensive, no tachycardia or hypoxia.  On my exam she is well-appearing, in no acute distress.  Her lungs are clear to auscultation in all fields.  Abdomen is nontender, no peritoneal signs.  No lower extremity edema or signs of infection.  Neuro exam without focal deficit. Presentation is like pts typical HA and non concerning for [facility] ICH, Meningitis, or  temporal arteritis. She has no nuchal rigidity, or change in vision.  Labs today without leukocytosis, hemoglobin consistent with baseline. Elevated glucose at 438, normal anion gap, GFR 35. DKA unlikely. Creatinine is 1.72, has been elevated in the past. No severe electrolyte abnormalities. Chest xray viewed by me shows bilateral opacities consistent with covid pneumonia. EKG without ischemic changes.  Patient given IVF for hyperglycemia, tylenol for headache, home BP meds, azithromycin. On reassessment she reports feeling better. She feels she can manage  her symptoms at home. Patient ambulated in the emergency department without respiratory distress, tachycardia, or hypoxia. SpO2 during ambulation >94% on room air.  The patient appears reasonably screened and/or stabilized for discharge and I doubt any other medical condition or other [facility] requiring further screening, evaluation, or treatment in the ED at this time prior to discharge. The patient is safe for discharge with strict return precautions discussed. Recommend pcp follow up if symptoms persist. Findings and plan of care discussed with supervising physician Dr. Eulis Foster.    Rachel Gonzalez was evaluated in Emergency Department on 02/23/2019 for the symptoms described in the history of present illness. She was evaluated in the context of the global COVID-19 pandemic, which necessitated consideration that the patient might be at risk for infection with the SARS-CoV-2 virus that causes COVID-19. Institutional protocols and algorithms that pertain to the evaluation of patients at risk for COVID-19 are in a state of rapid change based on information released by regulatory bodies including the CDC and federal and state organizations. These policies and algorithms were followed during the patient's care in the ED.   Portions of this note were generated with L[employer]. Dictation errors may occur despite best attempts at proofreading.   Final Clinical Impression(s) / ED Diagnoses Final diagnoses:  COVID-19 virus infection    Rx / DC Orders ED Discharge Orders         Ordered    azithromycin (ZITHROMAX Z-PAK) 250 MG tablet  Daily     02/23/19 1702           Flint Melter 02/23/19 1929    Daleen Bo, MD 02/24/19 [phone]

## 2019-02-28 ENCOUNTER — Ambulatory Visit (INDEPENDENT_AMBULATORY_CARE_PROVIDER_SITE_OTHER): Payer: Medicare HMO | Admitting: Family Medicine

## 2019-02-28 ENCOUNTER — Encounter: Payer: Self-pay | Admitting: Family Medicine

## 2019-02-28 ENCOUNTER — Other Ambulatory Visit: Payer: Self-pay

## 2019-02-28 DIAGNOSIS — E1165 Type 2 diabetes mellitus with hyperglycemia: Secondary | ICD-10-CM | POA: Diagnosis not present

## 2019-02-28 DIAGNOSIS — I10 Essential (primary) hypertension: Secondary | ICD-10-CM

## 2019-02-28 DIAGNOSIS — U071 COVID-19: Secondary | ICD-10-CM | POA: Diagnosis not present

## 2019-02-28 DIAGNOSIS — J1289 Other viral pneumonia: Secondary | ICD-10-CM | POA: Diagnosis not present

## 2019-02-28 DIAGNOSIS — R05 Cough: Secondary | ICD-10-CM

## 2019-02-28 DIAGNOSIS — R053 Chronic cough: Secondary | ICD-10-CM

## 2019-02-28 DIAGNOSIS — R5383 Other fatigue: Secondary | ICD-10-CM | POA: Diagnosis not present

## 2019-02-28 DIAGNOSIS — J1282 Pneumonia due to coronavirus disease 2019: Secondary | ICD-10-CM

## 2019-02-28 MED ORDER — CORICIDIN HBP CONGESTION/COUGH 10-200 MG PO CAPS: 1.0000 | ORAL_CAPSULE | Freq: Four times a day (QID) | ORAL | 0 refills | Status: DC | PRN

## 2019-02-28 NOTE — Progress Notes (Signed)
Patient [facility]   Virtual Visit via Telephone Note  I connected with Rachel Gonzalez on 02/28/19 at  2:40 PM EST by telephone and verified that I am speaking with the correct person using two identifiers.   I discussed the limitations, risks, security and privacy concerns of performing an evaluation and management service by telephone and the availability of in person appointments. I also discussed with the patient that there may be a patient responsible charge related to this service. The patient expressed understanding and agreed to proceed.   History of Present Illness: Rachel Gonzalez, a very pleasant 68 year old female with a medical history significant for uncontrolled type 2 diabetes mellitus, essential hypertension, chronic kidney disease, GERD, s/p toe amputation, and anemia of chronic disease was recently diagnosed with COVID-19 infection on 02/12/2019 at urgent care.  Patient presented to the emergency department on day 11 of Covid illness.  At the time, patient reported generalized weakness and fatigue, loss of appetite, and cough.  Prior to ER visit, patient had not been checking blood sugars due to the fact that glucometer was not working.  Patient continues to be without glucometer, but is taking her insulin consistently.  She endorses chills, no fever.  She continues to have a daily headache.  She says that symptoms have improved some, but she is not in her usual state of health.  Rachel Gonzalez denies chest pain, abdominal discomfort, nausea, vomiting, urinary symptoms, paresthesias, or dizziness.  Patient also has a history of essential hypertension.  She has been taking antihypertensives consistently.  However she has not been checking blood pressure at home.   Past Medical History:  Diagnosis Date  . Anemia   . Arthritis    right finger  . Diabetes mellitus   . GERD (gastroesophageal reflux disease)   . Hypertension   . Renal disorder     Social History   Socioeconomic History  . Marital status: Married    Spouse name: Not on file  . Number of children: Not on file  . Years of education: Not on file  . Highest education level: Not on file  Occupational History  . Not on file  Tobacco Use  . Smoking status: Never Smoker  . Smokeless tobacco: Never Used  Substance and Sexual Activity  . Alcohol use: No  . Drug use: No  . Sexual activity: Not on file  Other Topics Concern  . Not on file  Social History Narrative   From [place].   Married.   Three children, 9 grandchildren.   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   Travels to [place], [place]   Social Determinants of Health   Financial Resource Strain:   . Difficulty of Paying Living Expenses: Not on file  Food Insecurity:   . Worried About Charity fundraiser in the Last Year: Not on file  . Ran Out of Food in the Last Year: Not on file  Transportation Needs:   . Lack of Transportation (Medical): Not on file  . Lack of Transportation (Non-Medical): Not on file  Physical Activity:   . Days of Exercise per Week: Not on file  . Minutes of Exercise per Session: Not on file  Stress:   . Feeling of Stress : Not on file  Social Connections:   . Frequency of Communication with Friends and Family: Not on file  . Frequency of Social Gatherings with Friends  and Family: Not on file  . Attends Religious Services: Not on file  . Active Member of Clubs or Organizations: Not on file  . Attends Banker Meetings: Not on file  . Marital Status: Not on file  Intimate Partner Violence:   . Fear of Current or Ex-Partner: Not on file  . Emotionally Abused: Not on file  . Physically Abused: Not on file  . Sexually Abused: Not on file   Immunization History  Administered Date(s) Administered  . Pneumococcal Polysaccharide-23 02/12/2015  . Td 05/07/2014  . Tdap 11/21/2007   Allergies  Allergen  Reactions  . Tramadol Nausea And Vomiting  . Adhesive [Tape] Itching   Past Surgical History:  Procedure Laterality Date  . AMPUTATION Right 05/08/2014   Procedure: AMPUTATION RAY, right great toe;  Surgeon: Nadara Mustard, MD;  Location: [facility] OR;  Service: Orthopedics;  Laterality: Right;  . arm surgery     . CESAREAN SECTION    . COLONOSCOPY    . I & D EXTREMITY Right 05/08/2014   Procedure: IRRIGATION AND DEBRIDEMENT FOOT;  Surgeon: Nadara Mustard, MD;  Location: [facility] OR;  Service: Orthopedics;  Laterality: Right;  . MEMBRANE PEEL Right 03/15/2018   Procedure: MEMBRANE PEEL;  Surgeon: Carmela Rima, MD;  Location: [facility] OR;  Service: Ophthalmology;  Laterality: Right;  . PHOTOCOAGULATION WITH LASER Right 03/15/2018   Procedure: PHOTOCOAGULATION WITH LASER;  Surgeon: Carmela Rima, MD;  Location: [facility] OR;  Service: Ophthalmology;  Laterality: Right;  . 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 Persistent cough Patient advised to increase fluid intake and continue increased rest. - Dextromethorphan-guaiFENesin (CORICIDIN HBP CONGESTION/COUGH) 10-200 MG CAPS; Take 1 capsule by mouth every 6 (six) hours as needed.  Dispense: 168 capsule; Refill: 0  2. Pneumonia due to COVID-19 virus Patient has completed antibiotics, no further treatment warranted at this time.  Will repeat chest x-ray at follow-up appointment in 1 month.  3. Fatigue, unspecified type Continue resting and conserving energy.  Also, it is important that patient checks blood pressure and blood sugars consistently.  She will also need to bring glucometer to follow-up appointment.  4. Uncontrolled type 2 diabetes mellitus with hyperglycemia (HCC) No medication changes over the  phone.  Patient warrants a follow-up hemoglobin A1c and fasting glucose.  Also advised to bring glucometer to follow-up appointment  5. Essential hypertension  Continue medication, monitor blood pressure at home. Continue DASH diet. Reminder to go to the ER if any CP, SOB, nausea, dizziness, severe HA, changes vision/speech, left arm numbness and tingling and jaw pain.     Follow Up Instructions:    I discussed the assessment and treatment plan with the patient. The patient was provided an opportunity to ask questions and all were answered. The patient agreed with the plan and demonstrated an understanding of the instructions.   The patient was advised to call back or seek an in-person evaluation if the symptoms worsen or if the condition fails to improve as anticipated.  I provided 13 minutes of non-face-to-face time during this encounter.   Nolon Nations  APRN, MSN, FNP-C Patient Care [facility] [address] [place], [name] [address] [phone]

## 2019-03-20 ENCOUNTER — Other Ambulatory Visit: Payer: Self-pay | Admitting: Internal Medicine

## 2019-03-27 ENCOUNTER — Other Ambulatory Visit: Payer: Self-pay

## 2019-03-27 MED ORDER — OMEPRAZOLE 20 MG PO CPDR: 20.0000 mg | DELAYED_RELEASE_CAPSULE | Freq: Every day | ORAL | 3 refills | Status: DC

## 2019-04-05 ENCOUNTER — Telehealth: Payer: Self-pay | Admitting: Family Medicine

## 2019-04-05 ENCOUNTER — Other Ambulatory Visit: Payer: Self-pay | Admitting: Family Medicine

## 2019-04-05 DIAGNOSIS — E1165 Type 2 diabetes mellitus with hyperglycemia: Secondary | ICD-10-CM

## 2019-04-05 MED ORDER — INSULIN GLARGINE 100 UNITS/ML SOLOSTAR PEN: 45.0000 [IU] | PEN_INJECTOR | Freq: Every day | SUBCUTANEOUS | 11 refills | Status: DC

## 2019-04-05 NOTE — Progress Notes (Signed)
Meds ordered this encounter  Medications  . insulin glargine (LANTUS) 100 unit/mL SOPN    Sig: Inject 0.45 mLs (45 Units total) into the skin daily.    Dispense:  15 mL    Refill:  11    Order Specific Question:   Supervising Provider    Answer:   Quentin Angst [[id number]]    Nolon Nations  APRN, MSN, FNP-C Patient Care [facility] [address] [place], [name] [address] [phone]

## 2019-04-05 NOTE — Telephone Encounter (Signed)
Can her insulin be changed? Please advise.

## 2019-05-01 ENCOUNTER — Other Ambulatory Visit: Payer: Self-pay | Admitting: Internal Medicine

## 2019-05-01 DIAGNOSIS — E1165 Type 2 diabetes mellitus with hyperglycemia: Secondary | ICD-10-CM

## 2019-05-01 DIAGNOSIS — L304 Erythema intertrigo: Secondary | ICD-10-CM

## 2019-05-02 ENCOUNTER — Other Ambulatory Visit: Payer: Self-pay

## 2019-05-02 DIAGNOSIS — E1165 Type 2 diabetes mellitus with hyperglycemia: Secondary | ICD-10-CM

## 2019-05-02 DIAGNOSIS — L304 Erythema intertrigo: Secondary | ICD-10-CM

## 2019-05-02 MED ORDER — GABAPENTIN 300 MG PO CAPS: 300.0000 mg | ORAL_CAPSULE | Freq: Two times a day (BID) | ORAL | 0 refills | Status: DC

## 2019-05-02 MED ORDER — NYSTATIN 100000 UNIT/GM EX OINT: TOPICAL_OINTMENT | CUTANEOUS | 0 refills | Status: DC

## 2019-05-08 ENCOUNTER — Ambulatory Visit: Payer: Medicare HMO | Attending: Internal Medicine

## 2019-05-08 DIAGNOSIS — Z23 Encounter for immunization: Secondary | ICD-10-CM | POA: Insufficient documentation

## 2019-05-08 NOTE — Progress Notes (Signed)
   Covid-19 Vaccination Clinic  Name:  Rachel Gonzalez    MRN: [id number] DOB: June 07, 1950  05/08/2019  [name] was observed post Covid-19 immunization for 15 minutes without incidence. She was provided with Vaccine Information Sheet and instruction to access the V-Safe system.   [name] was instructed to call 911 with any severe reactions post vaccine: Marland Kitchen Difficulty breathing  . Swelling of your face and throat  . A fast heartbeat  . A bad rash all over your body  . Dizziness and weakness    Immunizations Administered    Name Date Dose VIS Date Route   Pfizer COVID-19 Vaccine 05/08/2019  8:26 AM 0.3 mL 02/17/2019 Intramuscular   Manufacturer: [employer], [employer]   Lot: [id number]   NDC: [address]-0

## 2019-05-13 DIAGNOSIS — M79603 Pain in arm, unspecified: Secondary | ICD-10-CM | POA: Diagnosis not present

## 2019-05-13 DIAGNOSIS — I10 Essential (primary) hypertension: Secondary | ICD-10-CM | POA: Diagnosis not present

## 2019-05-13 DIAGNOSIS — R52 Pain, unspecified: Secondary | ICD-10-CM | POA: Diagnosis not present

## 2019-05-23 ENCOUNTER — Other Ambulatory Visit: Payer: Self-pay

## 2019-05-23 ENCOUNTER — Ambulatory Visit (INDEPENDENT_AMBULATORY_CARE_PROVIDER_SITE_OTHER): Payer: Medicare HMO | Admitting: Family Medicine

## 2019-05-23 ENCOUNTER — Encounter: Payer: Self-pay | Admitting: Family Medicine

## 2019-05-23 VITALS — BP 148/72 | HR 76 | Temp 97.9°F | Resp 16 | Ht 64.0 in | Wt 222.0 lb

## 2019-05-23 DIAGNOSIS — L03119 Cellulitis of unspecified part of limb: Secondary | ICD-10-CM

## 2019-05-23 DIAGNOSIS — M25511 Pain in right shoulder: Secondary | ICD-10-CM

## 2019-05-23 DIAGNOSIS — L299 Pruritus, unspecified: Secondary | ICD-10-CM

## 2019-05-23 DIAGNOSIS — S4991XD Unspecified injury of right shoulder and upper arm, subsequent encounter: Secondary | ICD-10-CM

## 2019-05-23 DIAGNOSIS — N1832 Chronic kidney disease, stage 3b: Secondary | ICD-10-CM

## 2019-05-23 DIAGNOSIS — E1165 Type 2 diabetes mellitus with hyperglycemia: Secondary | ICD-10-CM | POA: Diagnosis not present

## 2019-05-23 DIAGNOSIS — I10 Essential (primary) hypertension: Secondary | ICD-10-CM | POA: Diagnosis not present

## 2019-05-23 DIAGNOSIS — L02419 Cutaneous abscess of limb, unspecified: Secondary | ICD-10-CM

## 2019-05-23 DIAGNOSIS — R600 Localized edema: Secondary | ICD-10-CM | POA: Diagnosis not present

## 2019-05-23 DIAGNOSIS — G8929 Other chronic pain: Secondary | ICD-10-CM

## 2019-05-23 DIAGNOSIS — S4991XA Unspecified injury of right shoulder and upper arm, initial encounter: Secondary | ICD-10-CM | POA: Diagnosis not present

## 2019-05-23 DIAGNOSIS — Z23 Encounter for immunization: Secondary | ICD-10-CM

## 2019-05-23 LAB — POCT URINALYSIS DIPSTICK
Bilirubin, UA: NEGATIVE
Glucose, UA: NEGATIVE
Ketones, UA: NEGATIVE
Leukocytes, UA: NEGATIVE
Nitrite, UA: NEGATIVE
Protein, UA: POSITIVE — AB
Spec Grav, UA: 1.025 (ref 1.010–1.025)
Urobilinogen, UA: 0.2 E.U./dL
pH, UA: 7 (ref 5.0–8.0)

## 2019-05-23 LAB — POCT GLYCOSYLATED HEMOGLOBIN (HGB A1C): Hemoglobin A1C: 10.2 % — AB (ref 4.0–5.6)

## 2019-05-23 MED ORDER — ACETAMINOPHEN-CODEINE #3 300-30 MG PO TABS: ORAL_TABLET | ORAL | 0 refills | Status: DC

## 2019-05-23 MED ORDER — FUROSEMIDE 20 MG PO TABS: 20.0000 mg | ORAL_TABLET | Freq: Every day | ORAL | 3 refills | Status: DC

## 2019-05-23 MED ORDER — TRIAMCINOLONE 0.1 % CREAM:EUCERIN CREAM 1:1: 1.0000 "application " | TOPICAL_CREAM | Freq: Three times a day (TID) | CUTANEOUS | 5 refills | Status: DC | PRN

## 2019-05-23 MED ORDER — AMLODIPINE BESYLATE 10 MG PO TABS: 10.0000 mg | ORAL_TABLET | Freq: Every day | ORAL | 3 refills | Status: DC

## 2019-05-23 MED ORDER — INSULIN GLARGINE 100 UNITS/ML SOLOSTAR PEN: 45.0000 [IU] | PEN_INJECTOR | Freq: Every day | SUBCUTANEOUS | 11 refills | Status: DC

## 2019-05-23 MED ORDER — SULFAMETHOXAZOLE-TRIMETHOPRIM 800-160 MG PO TABS: 1.0000 | ORAL_TABLET | Freq: Two times a day (BID) | ORAL | 0 refills | Status: DC
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
## 2019-05-23 NOTE — Patient Instructions (Addendum)
Please contact your pharmacy about prescription refills. In the event that you do not have refills, contact our office and we will respond within 24 hours.    Diabetes Basics  Diabetes (diabetes mellitus) is a long-term (chronic) disease. It occurs when the body does not properly use sugar (glucose) that is released from food after you eat. Diabetes may be caused by one or both of these problems:  Your pancreas does not make enough of a hormone called insulin.  Your body does not react in a normal way to insulin that it makes. Insulin lets sugars (glucose) go into cells in your body. This gives you energy. If you have diabetes, sugars cannot get into cells. This causes high blood sugar (hyperglycemia). Follow these instructions at home: How is diabetes treated? You may need to take insulin or other diabetes medicines daily to keep your blood sugar in balance. Take your diabetes medicines every day as told by your doctor. List your diabetes medicines here: Diabetes medicines  Name of medicine: ______________________________ ? Amount (dose): _______________ Time (a.m./p.m.): _______________ Notes: ___________________________________  Name of medicine: ______________________________ ? Amount (dose): _______________ Time (a.m./p.m.): _______________ Notes: ___________________________________  Name of medicine: ______________________________ ? Amount (dose): _______________ Time (a.m./p.m.): _______________ Notes: ___________________________________ If you use insulin, you will learn how to give yourself insulin by injection. You may need to adjust the amount based on the food that you eat. List the types of insulin you use here: Insulin  Insulin type: ______________________________ ? Amount (dose): _______________ Time (a.m./p.m.): _______________ Notes: ___________________________________  Insulin type: ______________________________ ? Amount (dose): _______________ Time (a.m./p.m.):  _______________ Notes: ___________________________________  Insulin type: ______________________________ ? Amount (dose): _______________ Time (a.m./p.m.): _______________ Notes: ___________________________________  Insulin type: ______________________________ ? Amount (dose): _______________ Time (a.m./p.m.): _______________ Notes: ___________________________________  Insulin type: ______________________________ ? Amount (dose): _______________ Time (a.m./p.m.): _______________ Notes: ___________________________________ How do I manage my blood sugar?  Check your blood sugar levels using a blood glucose monitor as directed by your doctor. Your doctor will set treatment goals for you. Generally, you should have these blood sugar levels:  Before meals (preprandial): 80-130 mg/dL (2.6-9.4 mmol/L).  After meals (postprandial): below 180 mg/dL (10 mmol/L).  A1c level: less than 7%. Write down the times that you will check your blood sugar levels: Blood sugar checks  Time: _______________ Notes: ___________________________________  Time: _______________ Notes: ___________________________________  Time: _______________ Notes: ___________________________________  Time: _______________ Notes: ___________________________________  Time: _______________ Notes: ___________________________________  Time: _______________ Notes: ___________________________________  What do I need to know about low blood sugar? Low blood sugar is called hypoglycemia. This is when blood sugar is at or below 70 mg/dL (3.9 mmol/L). Symptoms may include:  Feeling: ? Hungry. ? Worried or nervous (anxious). ? Sweaty and clammy. ? Confused. ? Dizzy. ? Sleepy. ? Sick to your stomach (nauseous).  Having: ? A fast heartbeat. ? A headache. ? A change in your vision. ? Tingling or no feeling (numbness) around the mouth, lips, or tongue. ? Jerky movements that you cannot control (seizure).  Having trouble  with: ? Moving (coordination). ? Sleeping. ? Passing out (fainting). ? Getting upset easily (irritability). Treating low blood sugar To treat low blood sugar, eat or drink something sugary right away. If you can think clearly and swallow safely, follow the 15:15 rule:  Take 15 grams of a fast-acting carb (carbohydrate). Talk with your doctor about how much you should take.  Some fast-acting carbs are: ? Sugar tablets (glucose pills). Take 3-4 glucose pills. ? 6-8 pieces of  hard candy. ? 4-6 oz (120-150 mL) of fruit juice. ? 4-6 oz (120-150 mL) of regular (not diet) soda. ? 1 Tbsp (15 mL) honey or sugar.  Check your blood sugar 15 minutes after you take the carb.  If your blood sugar is still at or below 70 mg/dL (3.9 mmol/L), take 15 grams of a carb again.  If your blood sugar does not go above 70 mg/dL (3.9 mmol/L) after 3 tries, get help right away.  After your blood sugar goes back to normal, eat a meal or a snack within 1 hour. Treating very low blood sugar If your blood sugar is at or below 54 mg/dL (3 mmol/L), you have very low blood sugar (severe hypoglycemia). This is an emergency. Do not wait to see if the symptoms will go away. Get medical help right away. Call your local emergency services (911 in the [place] Do not drive yourself to the hospital. Questions to ask your health care provider  Do I need to meet with a diabetes educator?  What equipment will I need to care for myself at home?  What diabetes medicines do I need? When should I take them?  How often do I need to check my blood sugar?  What number can I call if I have questions?  When is my next doctor's visit?  Where can I find a support group for people with diabetes? Where to find more information  American Diabetes Association: www.diabetes.org  American Association of Diabetes Educators: www.diabeteseducator.org/patient-resources Contact a doctor if:  Your blood sugar is at or above 240 mg/dL  (13.3 mmol/L) for 2 days in a row.  You have been sick or have had a fever for 2 days or more, and you are not getting better.  You have any of these problems for more than 6 hours: ? You cannot eat or drink. ? You feel sick to your stomach (nauseous). ? You throw up (vomit). ? You have watery poop (diarrhea). Get help right away if:  Your blood sugar is lower than 54 mg/dL (3 mmol/L).  You get confused.  You have trouble: ? Thinking clearly. ? Breathing. Summary  Diabetes (diabetes mellitus) is a long-term (chronic) disease. It occurs when the body does not properly use sugar (glucose) that is released from food after digestion.  Take insulin and diabetes medicines as told.  Check your blood sugar every day, as often as told.  Keep all follow-up visits as told by your doctor. This is important. This information is not intended to replace advice given to you by your health care provider. Make sure you discuss any questions you have with your health care provider. Document Revised: 11/16/2018 Document Reviewed: 05/28/2017 Elsevier Patient Education  [employer].      Return in 1 month for lab,

## 2019-05-24 LAB — BASIC METABOLIC PANEL
BUN/Creatinine Ratio: 11 — ABNORMAL LOW (ref 12–28)
BUN: 16 mg/dL (ref 8–27)
CO2: 25 mmol/L (ref 20–29)
Calcium: 9.6 mg/dL (ref 8.7–10.3)
Chloride: 104 mmol/L (ref 96–106)
Creatinine, Ser: 1.46 mg/dL — ABNORMAL HIGH (ref 0.57–1.00)
GFR calc Af Amer: 43 mL/min/{1.73_m2} — ABNORMAL LOW (ref >59)
GFR calc non Af Amer: 37 mL/min/{1.73_m2} — ABNORMAL LOW (ref >59)
Glucose: 87 mg/dL (ref 65–99)
Potassium: 4.2 mmol/L (ref 3.5–5.2)
Sodium: 141 mmol/L (ref 134–144)

## 2019-05-24 LAB — MICROALBUMIN / CREATININE URINE RATIO
Creatinine, Urine: 58.4 mg/dL
Microalb/Creat Ratio: 1597 mg/g creat — ABNORMAL HIGH (ref 0–29)
Microalbumin, Urine: 932.9 ug/mL

## 2019-05-25 ENCOUNTER — Other Ambulatory Visit: Payer: Self-pay | Admitting: Internal Medicine

## 2019-05-25 ENCOUNTER — Telehealth: Payer: Self-pay

## 2019-05-25 NOTE — Telephone Encounter (Signed)
-----   Message from Lachina M Hollis, FNP sent at 05/25/2019 10:44 AM EDT ----- Regarding: lab results Please inform patient that creatinine, an indicator of kidney functioning continues to be mildly elevated. Greater control of blood sugar is warranted. Encourage medication compliance in order to achieve positive outcomes. Also, recommend a carbohydrate modified diet divided over small meals throughout the day.  Follow up in office as scheduled.   Lachina Moore Hollis  APRN, MSN, FNP-C [facility] [address]  [place],  [address] [phone]   

## 2019-05-25 NOTE — Telephone Encounter (Signed)
Called and spoke with patient, advised that kidney function continues to be mildly elevated. Asked that she work on getting better control of blood sugar. Asked that she eat a carb modified diet over small meals throughout the day and keep next scheduled follow up. Thanks!

## 2019-05-25 NOTE — Telephone Encounter (Signed)
Patient informed. 

## 2019-05-25 NOTE — Telephone Encounter (Signed)
-----   Message from Massie Maroon, [place] sent at 05/25/2019 10:44 AM EDT ----- Regarding: lab results Please inform patient that creatinine, an indicator of kidney functioning continues to be mildly elevated. Greater control of blood sugar is warranted. Encourage medication compliance in order to achieve positive outcomes. Also, recommend a carbohydrate modified diet divided over small meals throughout the day.  Follow up in office as scheduled.   Nolon Nations  APRN, MSN, FNP-C [facility] [address] [place], [name] [address] [phone]

## 2019-05-30 ENCOUNTER — Encounter (HOSPITAL_COMMUNITY): Payer: Self-pay

## 2019-05-30 ENCOUNTER — Inpatient Hospital Stay (HOSPITAL_COMMUNITY)
Admission: EM | Admit: 2019-05-30 | Discharge: 2019-06-01 | DRG: 683 | Disposition: A | Discharge status: Home / Self Care | Payer: Medicare HMO | Attending: Internal Medicine | Admitting: Internal Medicine

## 2019-05-30 ENCOUNTER — Other Ambulatory Visit: Payer: Self-pay

## 2019-05-30 DIAGNOSIS — E871 Hypo-osmolality and hyponatremia: Secondary | ICD-10-CM | POA: Diagnosis present

## 2019-05-30 DIAGNOSIS — Z794 Long term (current) use of insulin: Secondary | ICD-10-CM

## 2019-05-30 DIAGNOSIS — L299 Pruritus, unspecified: Secondary | ICD-10-CM | POA: Diagnosis not present

## 2019-05-30 DIAGNOSIS — T368X5A Adverse effect of other systemic antibiotics, initial encounter: Secondary | ICD-10-CM | POA: Diagnosis not present

## 2019-05-30 DIAGNOSIS — N1832 Chronic kidney disease, stage 3b: Secondary | ICD-10-CM | POA: Diagnosis not present

## 2019-05-30 DIAGNOSIS — E1122 Type 2 diabetes mellitus with diabetic chronic kidney disease: Secondary | ICD-10-CM | POA: Diagnosis present

## 2019-05-30 DIAGNOSIS — M19041 Primary osteoarthritis, right hand: Secondary | ICD-10-CM | POA: Diagnosis present

## 2019-05-30 DIAGNOSIS — K529 Noninfective gastroenteritis and colitis, unspecified: Secondary | ICD-10-CM | POA: Diagnosis present

## 2019-05-30 DIAGNOSIS — Z833 Family history of diabetes mellitus: Secondary | ICD-10-CM | POA: Diagnosis not present

## 2019-05-30 DIAGNOSIS — L03116 Cellulitis of left lower limb: Secondary | ICD-10-CM | POA: Diagnosis not present

## 2019-05-30 DIAGNOSIS — Z20822 Contact with and (suspected) exposure to covid-19: Secondary | ICD-10-CM | POA: Diagnosis present

## 2019-05-30 DIAGNOSIS — E869 Volume depletion, unspecified: Secondary | ICD-10-CM | POA: Diagnosis present

## 2019-05-30 DIAGNOSIS — N179 Acute kidney failure, unspecified: Principal | ICD-10-CM

## 2019-05-30 DIAGNOSIS — E875 Hyperkalemia: Secondary | ICD-10-CM | POA: Diagnosis present

## 2019-05-30 DIAGNOSIS — I129 Hypertensive chronic kidney disease with stage 1 through stage 4 chronic kidney disease, or unspecified chronic kidney disease: Secondary | ICD-10-CM | POA: Diagnosis present

## 2019-05-30 DIAGNOSIS — E1165 Type 2 diabetes mellitus with hyperglycemia: Secondary | ICD-10-CM

## 2019-05-30 DIAGNOSIS — R112 Nausea with vomiting, unspecified: Secondary | ICD-10-CM

## 2019-05-30 DIAGNOSIS — R110 Nausea: Secondary | ICD-10-CM | POA: Diagnosis not present

## 2019-05-30 DIAGNOSIS — Z91048 Other nonmedicinal substance allergy status: Secondary | ICD-10-CM

## 2019-05-30 DIAGNOSIS — E785 Hyperlipidemia, unspecified: Secondary | ICD-10-CM | POA: Diagnosis present

## 2019-05-30 DIAGNOSIS — N189 Chronic kidney disease, unspecified: Secondary | ICD-10-CM

## 2019-05-30 DIAGNOSIS — R197 Diarrhea, unspecified: Secondary | ICD-10-CM

## 2019-05-30 DIAGNOSIS — Z885 Allergy status to narcotic agent status: Secondary | ICD-10-CM | POA: Diagnosis not present

## 2019-05-30 DIAGNOSIS — K219 Gastro-esophageal reflux disease without esophagitis: Secondary | ICD-10-CM | POA: Diagnosis present

## 2019-05-30 DIAGNOSIS — E1140 Type 2 diabetes mellitus with diabetic neuropathy, unspecified: Secondary | ICD-10-CM | POA: Diagnosis present

## 2019-05-30 DIAGNOSIS — Z79899 Other long term (current) drug therapy: Secondary | ICD-10-CM | POA: Diagnosis not present

## 2019-05-30 DIAGNOSIS — I959 Hypotension, unspecified: Secondary | ICD-10-CM | POA: Diagnosis not present

## 2019-05-30 DIAGNOSIS — L03115 Cellulitis of right lower limb: Secondary | ICD-10-CM | POA: Diagnosis present

## 2019-05-30 DIAGNOSIS — R0902 Hypoxemia: Secondary | ICD-10-CM | POA: Diagnosis not present

## 2019-05-30 DIAGNOSIS — I10 Essential (primary) hypertension: Secondary | ICD-10-CM | POA: Diagnosis not present

## 2019-05-30 LAB — COMPREHENSIVE METABOLIC PANEL
ALT: 11 U/L (ref 0–44)
AST: 14 U/L — ABNORMAL LOW (ref 15–41)
Albumin: 3.5 g/dL (ref 3.5–5.0)
Alkaline Phosphatase: 121 U/L (ref 38–126)
Anion gap: 7 (ref 5–15)
BUN: 32 mg/dL — ABNORMAL HIGH (ref 8–23)
CO2: 23 mmol/L (ref 22–32)
Calcium: 9 mg/dL (ref 8.9–10.3)
Chloride: 103 mmol/L (ref 98–111)
Creatinine, Ser: 2.41 mg/dL — ABNORMAL HIGH (ref 0.44–1.00)
GFR calc Af Amer: 23 mL/min — ABNORMAL LOW (ref >60)
GFR calc non Af Amer: 20 mL/min — ABNORMAL LOW (ref >60)
Glucose, Bld: 254 mg/dL — ABNORMAL HIGH (ref 70–99)
Potassium: 5.4 mmol/L — ABNORMAL HIGH (ref 3.5–5.1)
Sodium: 133 mmol/L — ABNORMAL LOW (ref 135–145)
Total Bilirubin: 0.2 mg/dL — ABNORMAL LOW (ref 0.3–1.2)
Total Protein: 7.5 g/dL (ref 6.5–8.1)

## 2019-05-30 LAB — CBC WITH DIFFERENTIAL/PLATELET
Abs Immature Granulocytes: 0.02 10*3/uL (ref 0.00–0.07)
Basophils Absolute: 0.1 10*3/uL (ref 0.0–0.1)
Basophils Relative: 1 %
Eosinophils Absolute: 0 10*3/uL (ref 0.0–0.5)
Eosinophils Relative: 0 %
HCT: 37.5 % (ref 36.0–46.0)
Hemoglobin: 11.6 g/dL — ABNORMAL LOW (ref 12.0–15.0)
Immature Granulocytes: 0 %
Lymphocytes Relative: 18 %
Lymphs Abs: 1.2 10*3/uL (ref 0.7–4.0)
MCH: 26.3 pg (ref 26.0–34.0)
MCHC: 30.9 g/dL (ref 30.0–36.0)
MCV: 85 fL (ref 80.0–100.0)
Monocytes Absolute: 0.4 10*3/uL (ref 0.1–1.0)
Monocytes Relative: 6 %
Neutro Abs: 5 10*3/uL (ref 1.7–7.7)
Neutrophils Relative %: 75 %
Platelets: 240 10*3/uL (ref 150–400)
RBC: 4.41 MIL/uL (ref 3.87–5.11)
RDW: 13.2 % (ref 11.5–15.5)
WBC: 6.7 10*3/uL (ref 4.0–10.5)
nRBC: 0 % (ref 0.0–0.2)

## 2019-05-30 LAB — URINALYSIS, ROUTINE W REFLEX MICROSCOPIC
Bilirubin Urine: NEGATIVE
Glucose, UA: 50 mg/dL — AB
Ketones, ur: NEGATIVE mg/dL
Nitrite: NEGATIVE
Protein, ur: 100 mg/dL — AB
Specific Gravity, Urine: 1.014 (ref 1.005–1.030)
pH: 5 (ref 5.0–8.0)

## 2019-05-30 MED ORDER — INSULIN ASPART 100 UNIT/ML ~~LOC~~ SOLN
0.0000 [IU] | Freq: Every day | SUBCUTANEOUS | Status: DC
  Administered 2019-05-31: 23:00:00 5 [IU] via SUBCUTANEOUS
  Filled 2019-05-30: qty 0.05

## 2019-05-30 MED ORDER — NYSTATIN 100000 UNIT/GM EX POWD
1.0000 "application " | Freq: Four times a day (QID) | CUTANEOUS | Status: DC | PRN
  Filled 2019-05-30: qty 15

## 2019-05-30 MED ORDER — INSULIN ASPART 100 UNIT/ML ~~LOC~~ SOLN
0.0000 [IU] | Freq: Three times a day (TID) | SUBCUTANEOUS | Status: DC
  Administered 2019-05-31: 3 [IU] via SUBCUTANEOUS
  Administered 2019-05-31: 7 [IU] via SUBCUTANEOUS
  Administered 2019-05-31: 5 [IU] via SUBCUTANEOUS
  Administered 2019-06-01: 9 [IU] via SUBCUTANEOUS
  Filled 2019-05-30: qty 0.09

## 2019-05-30 MED ORDER — SODIUM ZIRCONIUM CYCLOSILICATE 10 G PO PACK
10.0000 g | PACK | Freq: Once | ORAL | Status: AC
  Administered 2019-05-31: 10 g via ORAL
  Filled 2019-05-30: qty 1

## 2019-05-30 MED ORDER — ATORVASTATIN CALCIUM 10 MG PO TABS
10.0000 mg | ORAL_TABLET | Freq: Every day | ORAL | Status: DC
  Administered 2019-05-31 – 2019-06-01 (×2): 10 mg via ORAL
  Filled 2019-05-30 (×3): qty 1

## 2019-05-30 MED ORDER — FAMOTIDINE 20 MG PO TABS
10.0000 mg | ORAL_TABLET | Freq: Every day | ORAL | Status: DC
  Administered 2019-05-31: 10 mg via ORAL
  Filled 2019-05-30: qty 1

## 2019-05-30 MED ORDER — TRIAMCINOLONE 0.1 % CREAM:EUCERIN CREAM 1:1
1.0000 "application " | TOPICAL_CREAM | Freq: Three times a day (TID) | CUTANEOUS | Status: DC | PRN
  Filled 2019-05-30: qty 1

## 2019-05-30 MED ORDER — METHYLPREDNISOLONE SODIUM SUCC 40 MG IJ SOLR
40.0000 mg | Freq: Three times a day (TID) | INTRAMUSCULAR | Status: DC
  Administered 2019-05-31 (×3): 40 mg via INTRAVENOUS
  Filled 2019-05-30 (×3): qty 1

## 2019-05-30 MED ORDER — SODIUM CHLORIDE 0.9 % IV BOLUS
500.0000 mL | Freq: Once | INTRAVENOUS | Status: AC
  Administered 2019-05-30: 18:00:00 500 mL via INTRAVENOUS

## 2019-05-30 MED ORDER — LACTATED RINGERS IV BOLUS
1000.0000 mL | Freq: Once | INTRAVENOUS | Status: AC
  Administered 2019-05-30: 1000 mL via INTRAVENOUS

## 2019-05-30 MED ORDER — HEPARIN SODIUM (PORCINE) 5000 UNIT/ML IJ SOLN
5000.0000 [IU] | Freq: Three times a day (TID) | INTRAMUSCULAR | Status: DC
  Administered 2019-05-31 – 2019-06-01 (×5): 5000 [IU] via SUBCUTANEOUS
  Filled 2019-05-30 (×5): qty 1

## 2019-05-30 MED ORDER — DIPHENHYDRAMINE HCL 25 MG PO CAPS
25.0000 mg | ORAL_CAPSULE | Freq: Three times a day (TID) | ORAL | Status: DC | PRN
  Administered 2019-05-31: 25 mg via ORAL
  Filled 2019-05-30: qty 1

## 2019-05-30 MED ORDER — AMLODIPINE BESYLATE 10 MG PO TABS
10.0000 mg | ORAL_TABLET | Freq: Every day | ORAL | Status: DC
  Administered 2019-05-31 – 2019-06-01 (×2): 10 mg via ORAL
  Filled 2019-05-30 (×2): qty 1

## 2019-05-30 MED ORDER — INSULIN GLARGINE 100 UNIT/ML ~~LOC~~ SOLN
10.0000 [IU] | Freq: Every day | SUBCUTANEOUS | Status: DC
  Administered 2019-05-31: 10 [IU] via SUBCUTANEOUS
  Filled 2019-05-30 (×2): qty 0.1

## 2019-05-30 MED ORDER — GABAPENTIN 300 MG PO CAPS
300.0000 mg | ORAL_CAPSULE | Freq: Every day | ORAL | Status: DC
  Administered 2019-05-31 (×2): 300 mg via ORAL
  Filled 2019-05-30 (×2): qty 1

## 2019-05-30 MED ORDER — SODIUM CHLORIDE 0.9 % IV SOLN
1.0000 g | Freq: Every day | INTRAVENOUS | Status: DC
  Administered 2019-05-31 (×2): 1 g via INTRAVENOUS
  Filled 2019-05-30: qty 10
  Filled 2019-05-30 (×2): qty 1

## 2019-05-30 MED ORDER — ONDANSETRON HCL 4 MG/2ML IJ SOLN
4.0000 mg | Freq: Once | INTRAMUSCULAR | Status: AC
  Administered 2019-05-30: 4 mg via INTRAVENOUS
  Filled 2019-05-30: qty 2

## 2019-05-30 MED ORDER — SODIUM CHLORIDE 0.9 % IV SOLN: INTRAVENOUS | Status: DC

## 2019-05-30 NOTE — H&P (Signed)
History and Physical  OVETA IDRIS JOA:[id number] DOB: 12/09/1950 DOA: 05/30/2019  Referring physician: ER provider PCP: Dorena Dew, FNP  Outpatient Specialists:    Patient coming from: Home  Chief Complaint: Hives  HPI:  Patient is a 69 year old African-American female, poor historian, with past medical history significant for diabetes mellitus, hypertension, chronic kidney disease stage IIIb, GERD, arthritis and anemia.  Apparently, patient was started on Bactrim and Tylenol 3 for bilateral cellulitis of the lower extremity 5 days ago.  Earlier today, patient developed hives, with associated nausea vomiting.  According to the patient, she has had about 4 loose to watery stools.  Patient has also vomited about 3-4 times.  No associated fever.  Work-up done revealed worsening renal function, with serum creatinine of 2.41.  Patient's baseline serum creatinine ranges from 1.46-1.72.  Sodium of 133 static, potassium of 5.4 with a blood sugar of 254.  No headache, no neck pain, no URI symptoms, no chest pain, no shortness of breath and no urinary symptoms.  Patient be admitted for further assessment and management.  ED Course: On presentation to the hospital, vitals revealed temp of 98, blood pressure 150/93, heart rate of 77, respiratory rate of 16 and O2 sat of 99-100%.  Vital labs revealed WBC of 6.7, hemoglobin of 11.6, platelet count of 240, sodium of 133, potassium of 5.4, type II, serum creatinine of 2.41 with a blood sugar of 254.  Pertinent labs: As documented above.  Review of Systems:  Negative for fever, visual changes, sore throat, rash, new muscle aches, chest pain, SOB, dysuria, bleeding.  Past Medical History:  Diagnosis Date  . Anemia   . Arthritis    right finger  . Diabetes mellitus   . GERD (gastroesophageal reflux disease)   . Hypertension   . Renal disorder     Past Surgical History:  Procedure Laterality Date  . AMPUTATION Right 05/08/2014   Procedure:  AMPUTATION RAY, right great toe;  Surgeon: Newt Minion, MD;  Location: [place];  Service: Orthopedics;  Laterality: Right;  . arm surgery     . CESAREAN SECTION    . COLONOSCOPY    . I & D EXTREMITY Right 05/08/2014   Procedure: IRRIGATION AND DEBRIDEMENT FOOT;  Surgeon: Newt Minion, MD;  Location: [place];  Service: Orthopedics;  Laterality: Right;  . MEMBRANE PEEL Right 03/15/2018   Procedure: MEMBRANE PEEL;  Surgeon: Jalene Mullet, MD;  Location: [place];  Service: Ophthalmology;  Laterality: Right;  . PHOTOCOAGULATION WITH LASER Right 03/15/2018   Procedure: PHOTOCOAGULATION WITH LASER;  Surgeon: Jalene Mullet, MD;  Location: [place];  Service: Ophthalmology;  Laterality: Right;  . 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;  Service: Ophthalmology;  Laterality: Right;     reports that she has never smoked. She has never used smokeless tobacco. She reports that she does not drink alcohol or use drugs.  Allergies  Allergen Reactions  . Tramadol Nausea And Vomiting  . Adhesive [Tape] Itching  . Tylenol With Codeine #3 [Acetaminophen-Codeine] Diarrhea and Itching    Family History  Problem Relation Age of Onset  . Diabetes Mother      Prior to Admission medications   Medication Sig Start Date End Date Taking? Authorizing Provider  acetaminophen (TYLENOL) 500 MG tablet Take 500-1,000 mg by mouth every 6 (six) hours as needed (  pain.).   Yes [provider]  amLODipine (NORVASC) 10 MG tablet Take 1 tablet (10 mg total) by mouth daily. 05/23/19  Yes Dorena Dew, FNP  atorvastatin (LIPITOR) 10 MG tablet Take 1 tablet (10 mg total) by mouth daily. 11/09/18  Yes Lanae Boast, FNP  furosemide (LASIX) 20 MG tablet Take 1 tablet (20 mg total) by mouth daily. 05/23/19  Yes Dorena Dew, FNP    gabapentin (NEURONTIN) 300 MG capsule Take 1 capsule (300 mg total) by mouth 2 (two) times daily. 05/02/19  Yes Dorena Dew, FNP  insulin glargine (LANTUS) 100 unit/mL SOPN Inject 0.45 mLs (45 Units total) into the skin daily. 05/23/19  Yes Dorena Dew, FNP  nystatin (NYSTATIN) powder Apply topically 4 (four) times daily. Patient taking differently: Apply 1 application topically 4 (four) times daily as needed (rash).  05/11/18  Yes Lanae Boast, FNP  nystatin ointment (MYCOSTATIN) APPLY 1 APPLICATION OF  OINTMENT TOPICALLY TWICE DAILY Patient taking differently: Apply 1 application topically 2 (two) times daily as needed (rash). APPLY 1 APPLICATION OF  OINTMENT TOPICALLY TWICE DAILY PRN 05/02/19  Yes Dorena Dew, FNP  omeprazole (PRILOSEC) 20 MG capsule Take 1 capsule by mouth once daily 05/26/19  Yes Hollis, Asencion Partridge, FNP  TRESIBA FLEXTOUCH 200 UNIT/ML FlexTouch Pen Inject 44 Units into the skin daily.  05/23/19  Yes [provider]  Triamcinolone Acetonide (TRIAMCINOLONE 0.1 % CREAM : EUCERIN) CREA Apply 1 application topically 3 (three) times daily as needed. Patient taking differently: Apply 1 application topically 3 (three) times daily as needed for rash or itching.  05/23/19  Yes Dorena Dew, FNP  acetaminophen-codeine (TYLENOL #3) 300-30 MG tablet TAKE 1 TABLET BY MOUTH EVERY 6 HOURS AS NEEDED FOR MODERATE PAIN 05/23/19   Dorena Dew, FNP  Blood Glucose Monitoring Suppl (ACCU-CHEK AVIVA PLUS) w/Device KIT 1 Device by Does not apply route once. Use as directed to check blood sugar 06/11/15   Dorena Dew, FNP  Blood Glucose Monitoring Suppl (ONE TOUCH ULTRA SYSTEM KIT) w/Device KIT Test three times daily. Dx Type 2 Diabetes. 06/10/15   Dorena Dew, FNP  fluticasone (FLONASE) 50 MCG/ACT nasal spray Place 2 sprays into both nostrils daily. Patient not taking: Reported on 05/23/2019 08/31/18   Lanae Boast, [facility]) lancets  Use as instructed 06/11/15   Dorena Dew, FNP  levocetirizine (XYZAL) 5 MG tablet TAKE ONE TABLET BY MOUTH ONCE DAILY IN THE EVENING Patient not taking: Reported on 05/23/2019 05/11/18   Lanae Boast, FNP  ONE TOUCH LANCETS MISC Test 3 times daily with glucometer. Dx Type 2 diabetes with unspecified complications. 06/10/15   Dorena Dew, FNP  RELION PEN NEEDLE 31G/8MM 31G X 8 MM MISC USE AS DIRECTED 4 TIMES DAILY 12/20/17   Lanae Boast, FNP  sulfamethoxazole-trimethoprim (BACTRIM DS) 800-160 MG tablet Take 1 tablet by mouth 2 (two) times daily. Patient not taking: Reported on 05/30/2019 05/23/19   Dorena Dew, FNP    Physical Exam: Vitals:   05/30/19 1746 05/30/19 1748 05/30/19 1751 05/30/19 1955  BP:  (!) 151/78  (!) 150/93  Pulse:  77  76  Resp:  16  16  Temp:  98 F (36.7 C)    TempSrc:  Oral    SpO2: 100% 99%  99%  Weight:   99.8 kg   Height:   _0  (1.626 m)    Constitutional:  . Appears calm and comfortable Eyes:  .  Pallor. No jaundice.  ENMT:  . external ears, nose appear normal.  Dry buccal mucosa Neck:  . Neck is supple. No JVD Respiratory:  . CTA bilaterally, no w/r/r.  . Respiratory effort normal. No retractions or accessory muscle use Cardiovascular:  . S1S2 . Fullness of the ankle.    Abdomen:  . Abdomen is soft and non tender. Organs are difficult to assess. Neurologic:  . Awake and alert. . Moves all limbs.  Wt Readings from Last 3 Encounters:  05/30/19 99.8 kg  05/23/19 100.7 kg  11/23/18 102.1 kg    I have personally reviewed following labs and imaging studies  Labs on Admission:  CBC: Recent Labs  Lab 05/30/19 1821  WBC 6.7  NEUTROABS 5.0  HGB 11.6*  HCT 37.5  MCV 85.0  PLT 537   Basic Metabolic Panel: Recent Labs  Lab 05/30/19 1821  NA 133*  K 5.4*  CL 103  CO2 23  GLUCOSE 254*  BUN 32*  CREATININE 2.41*  CALCIUM 9.0   Liver Function Tests: Recent Labs  Lab 05/30/19 1821  AST 14*  ALT 11  ALKPHOS 121    BILITOT 0.2*  PROT 7.5  ALBUMIN 3.5   No results for input(s): LIPASE, AMYLASE in the last 168 hours. No results for input(s): AMMONIA in the last 168 hours. Coagulation Profile: No results for input(s): INR, PROTIME in the last 168 hours. Cardiac Enzymes: No results for input(s): CKTOTAL, CKMB, CKMBINDEX, TROPONINI in the last 168 hours. BNP (last 3 results) No results for input(s): PROBNP in the last 8760 hours. HbA1C: No results for input(s): HGBA1C in the last 72 hours. CBG: No results for input(s): GLUCAP in the last 168 hours. Lipid Profile: No results for input(s): CHOL, HDL, LDLCALC, TRIG, CHOLHDL, LDLDIRECT in the last 72 hours. Thyroid Function Tests: No results for input(s): TSH, T4TOTAL, FREET4, T3FREE, THYROIDAB in the last 72 hours. Anemia Panel: No results for input(s): VITAMINB12, FOLATE, FERRITIN, TIBC, IRON, RETICCTPCT in the last 72 hours. Urine analysis:    Component Value Date/Time   COLORURINE YELLOW 05/30/2019 1806   APPEARANCEUR HAZY (A) 05/30/2019 1806   LABSPEC 1.014 05/30/2019 1806   PHURINE 5.0 05/30/2019 1806   GLUCOSEU 50 (A) 05/30/2019 1806   HGBUR SMALL (A) 05/30/2019 1806   BILIRUBINUR NEGATIVE 05/30/2019 1806   BILIRUBINUR neg 05/23/2019 0927   KETONESUR NEGATIVE 05/30/2019 1806   PROTEINUR 100 (A) 05/30/2019 1806   UROBILINOGEN 0.2 05/23/2019 0927   UROBILINOGEN 0.2 04/01/2017 0837   NITRITE NEGATIVE 05/30/2019 1806   LEUKOCYTESUR TRACE (A) 05/30/2019 1806   Sepsis Labs: _0 (procalcitonin:4,lacticidven:4) )No results found for this or any previous visit (from the past 240 hour(s)).    Radiological Exams on Admission: No results found.   Active Problems:   Acute kidney injury superimposed on chronic kidney disease (HCC)   Assessment/Plan Acute kidney injury on chronic kidney disease stage IIIb: -This is likely secondary to combined effect of Bactrim use and volume depletion. -Hold Bactrim -Cautious hydration -Urine  sediment, urine protein creatinine -Monitor renal function closely, for further work-up if no improvement is noted. -Serum creatinine on presentation was 2.41. -Patient has baseline serum creatinine is 1.46-1.72.  Hyperkalemia/hyponatremia: -Likely secondary to Bactrim use. -Renal impairment may be playing some factor. -Lokelma 10 g x 1 dose -Monitor potassium level -Hopefully, hyponatremia improved with gentle hydration.  Gastroenteritis: -Etiology unclear -Stool analysis -Supportive care  Hives: -Patient reports improvement. -We will use IV Solu-Medrol, Benadryl and Pepcid -Management will depend on hospital course  Diabetes mellitus: -Subacute Lantus 10 units nightly -Sliding scale insulin coverage -Monitor closely  Hypertension: -Monitor closely and optimize. -Goal blood pressure for this patient should be less than 130/80 mmHg.  Cellulitis of lower extremities: -Improving. -We will DC Bactrim -Start patient on IV Rocephin.  DVT prophylaxis: Subacute heparin Code Status: Full code Family Communication:  Disposition Plan: Home eventually Consults called: None. Admission status: Inpatient  Time spent: 65 minutes  Dana Allan, MD  [facility] Pager #: [phone] 7PM-7AM contact night coverage as above  05/30/2019, 9:29 PM

## 2019-05-30 NOTE — ED Notes (Signed)
ED TO INPATIENT HANDOFF REPORT  ED Nurse Name and Phone #: Sharene Skeans [phone]  S Name/Age/Gender Rachel Gonzalez 69 y.o. female Room/Bed: WA13/WA13  Code Status   Code Status: Prior  Home/SNF/Other Home Patient oriented to: self, place, time and situation Is this baseline? Yes   Triage Complete: Triage complete  Chief Complaint Acute kidney injury superimposed on chronic kidney disease (HCC) [N17.9, N18.9]  Triage Note Per EMS- patient c/o itching since last night. Patient states she was prescribed a new medication 2 weeks ago and took this medication 2 days ago. Patient states she had N/V yesterday along with the itching and today just itching.  Patient was given Benadryl 50 mg IV prior to arrival to the ED.    Allergies Allergies  Allergen Reactions  . Tramadol Nausea And Vomiting  . Adhesive [Tape] Itching  . Tylenol With Codeine #3 [Acetaminophen-Codeine] Diarrhea and Itching    Level of Care/Admitting Diagnosis ED Disposition    ED Disposition Condition Comment   Admit  [facility] [[id number]]  Level of Care: Med-Surg [16]  May admit patient to [facility] or [facility] if equivalent level of care is available:: Yes  Covid Evaluation: Asymptomatic Screening Protocol (No Symptoms)  Diagnosis: Acute kidney injury superimposed on chronic kidney disease [facility]) [[id number]]  Admitting Physician: Barnetta Chapel [[id number]]  Attending Physician: Berton Mount I [[id number]]  Estimated length of stay: 3 - 4 days  Certification:: I certify this patient will need inpatient services for at least 2 midnights       B Medical/Surgery History Past Medical History:  Diagnosis Date  . Anemia   . Arthritis    right finger  . Diabetes mellitus   . GERD (gastroesophageal reflux disease)   . Hypertension   . Renal disorder    Past Surgical History:  Procedure Laterality Date  . AMPUTATION Right 05/08/2014   Procedure: AMPUTATION RAY, right  great toe;  Surgeon: Nadara Mustard, MD;  Location: [facility] OR;  Service: Orthopedics;  Laterality: Right;  . arm surgery     . CESAREAN SECTION    . COLONOSCOPY    . I & D EXTREMITY Right 05/08/2014   Procedure: IRRIGATION AND DEBRIDEMENT FOOT;  Surgeon: Nadara Mustard, MD;  Location: [facility] OR;  Service: Orthopedics;  Laterality: Right;  . MEMBRANE PEEL Right 03/15/2018   Procedure: MEMBRANE PEEL;  Surgeon: Carmela Rima, MD;  Location: [facility] OR;  Service: Ophthalmology;  Laterality: Right;  . PHOTOCOAGULATION WITH LASER Right 03/15/2018   Procedure: PHOTOCOAGULATION WITH LASER;  Surgeon: Carmela Rima, MD;  Location: [facility] OR;  Service: Ophthalmology;  Laterality: Right;  . 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/2018   Procedure: RIGHT EYE COMPLEX RETINA DETACHMENT VITRECTOMY MEMBRANE PEEL WITH AIR,GAS,SILICONE OIL PHOTOCOAGULATION;  Surgeon: Carmela Rima, MD;  Location: [facility] OR;  Service: Ophthalmology;  Laterality: Right;     A IV Location/Drains/Wounds Patient Lines/Drains/Airways Status   Active Line/Drains/Airways    Name:   Placement date:   Placement time:   Site:   Days:   Peripheral IV 05/30/19 Right Antecubital   05/30/19    1741    Antecubital   less than 1   Incision (Closed) 05/08/14 Foot Right   05/08/14    1814     1848   Incision (Closed) 03/15/18 Eye Right   03/15/18    1642     441          Intake/Output Last 24 hours  Intake/Output Summary (Last 24 hours) at 05/30/2019 2204 Last data filed at 05/30/2019 2134 Gross per 24 hour  Intake 1500 ml  Output -  Net 1500 ml    Labs/Imaging Results for orders placed or performed during the hospital encounter of 05/30/19 (from the past 48 hour(s))  Urinalysis, Routine w reflex microscopic     Status: Abnormal   Collection Time: 05/30/19  6:06 PM  Result Value Ref Range   Color, Urine YELLOW YELLOW   APPearance HAZY (A) CLEAR   Specific Gravity, Urine 1.014 1.005 - 1.030   pH 5.0 5.0 - 8.0   Glucose, UA 50 (A)  NEGATIVE mg/dL   Hgb urine dipstick SMALL (A) NEGATIVE   Bilirubin Urine NEGATIVE NEGATIVE   Ketones, ur NEGATIVE NEGATIVE mg/dL   Protein, ur 892 (A) NEGATIVE mg/dL   Nitrite NEGATIVE NEGATIVE   Leukocytes,Ua TRACE (A) NEGATIVE   RBC / HPF 6-10 0 - 5 RBC/hpf   WBC, UA 0-5 0 - 5 WBC/hpf   Bacteria, UA RARE (A) NONE SEEN   Squamous Epithelial / LPF 0-5 0 - 5   Mucus PRESENT    Hyaline Casts, UA PRESENT    Uric Acid Crys, UA PRESENT     Comment: Performed at [facility], [address], [place], [name] [address]  Comprehensive metabolic panel     Status: Abnormal   Collection Time: 05/30/19  6:21 PM  Result Value Ref Range   Sodium 133 (L) 135 - 145 mmol/L   Potassium 5.4 (H) 3.5 - 5.1 mmol/L   Chloride 103 98 - 111 mmol/L   CO2 23 22 - 32 mmol/L   Glucose, Bld 254 (H) 70 - 99 mg/dL    Comment: Glucose reference range applies only to samples taken after fasting for at least 8 hours.   BUN 32 (H) 8 - 23 mg/dL   Creatinine, Ser 7.40 (H) 0.44 - 1.00 mg/dL   Calcium 9.0 8.9 - 81.4 mg/dL   Total Protein 7.5 6.5 - 8.1 g/dL   Albumin 3.5 3.5 - 5.0 g/dL   AST 14 (L) 15 - 41 U/L   ALT 11 0 - 44 U/L   Alkaline Phosphatase 121 38 - 126 U/L   Total Bilirubin 0.2 (L) 0.3 - 1.2 mg/dL   GFR calc non Af Amer 20 (L) >60 mL/min   GFR calc Af Amer 23 (L) >60 mL/min   Anion gap 7 5 - 15    Comment: Performed at [facility], [address], [place], [name] [address]  CBC with Differential     Status: Abnormal   Collection Time: 05/30/19  6:21 PM  Result Value Ref Range   WBC 6.7 4.0 - 10.5 K/uL   RBC 4.41 3.87 - 5.11 MIL/uL   Hemoglobin 11.6 (L) 12.0 - 15.0 g/dL   HCT 63.1 49.7 - 02.6 %   MCV 85.0 80.0 - 100.0 fL   MCH 26.3 26.0 - 34.0 pg   MCHC 30.9 30.0 - 36.0 g/dL   RDW 37.8 58.8 - 50.2 %   Platelets 240 150 - 400 K/uL   nRBC 0.0 0.0 - 0.2 %   Neutrophils Relative % 75 %   Neutro Abs 5.0 1.7 - 7.7 K/uL   Lymphocytes Relative 18 %   Lymphs Abs  1.2 0.7 - 4.0 K/uL   Monocytes Relative 6 %   Monocytes Absolute 0.4 0.1 - 1.0 K/uL   Eosinophils Relative 0 %   Eosinophils Absolute 0.0  0.0 - 0.5 K/uL   Basophils Relative 1 %   Basophils Absolute 0.1 0.0 - 0.1 K/uL   Immature Granulocytes 0 %   Abs Immature Granulocytes 0.02 0.00 - 0.07 K/uL    Comment: Performed at [facility], [place] [address], [place], [place] [address]   No results found.  Pending Labs Unresulted Labs (From admission, onward)    Start     Ordered   05/31/19 3532  Basic metabolic panel  Tomorrow morning,   R     05/30/19 2203   05/31/19 0500  CBC  Tomorrow morning,   R     05/30/19 2203   05/30/19 2204  HIV Antibody (routine testing w rflx)  (HIV Antibody (Routine testing w reflex) panel)  Once,   R     05/30/19 2203   05/30/19 2204  CBC  (heparin)  Once,   R    Comments: Baseline for heparin therapy IF NOT ALREADY DRAWN.  Notify MD if PLT < 100 K.    05/30/19 2203   05/30/19 2204  Creatinine, serum  (heparin)  Once,   R    Comments: Baseline for heparin therapy IF NOT ALREADY DRAWN.    05/30/19 2203   05/30/19 2204  Magnesium  Once,   R     05/30/19 2203   05/30/19 2204  Phosphorus  Once,   R     05/30/19 2203   05/30/19 2204  Urinalysis, Complete w Microscopic  Once,   R     05/30/19 2203   05/30/19 2204  Sodium, urine, random  Once,   R     05/30/19 2203   05/30/19 2204  Protein / creatinine ratio, urine  Once,   R     05/30/19 2203   05/30/19 2204  Lactoferrin, Fecal,Qualitative  Once,   R     05/30/19 2203   05/30/19 2204  OVA + PARASITE EXAM  Once,   R     05/30/19 2203   05/30/19 2204  GI pathogen panel by PCR, stool  (Gastrointestinal Panel by PCR, Stool                                                                                                                                                     *Does Not include CLOSTRIDIUM DIFFICILE testing.**If CDIFF testing is needed, select the C Difficile Quick Screen w PCR reflex  order below)  Once,   R     05/30/19 2203   05/30/19 2004  SARS CORONAVIRUS 2 (TAT 6-24 HRS) Nasopharyngeal Nasopharyngeal Swab  (Tier 3 (TAT 6-24 hrs))  Once,   STAT    Question Answer Comment  Is this test for diagnosis or screening Screening   Symptomatic for COVID-19 as defined by CDC No   Hospitalized for COVID-19 No   Admitted  to ICU for COVID-19 No   Previously tested for COVID-19 Yes   Resident in a congregate (group) care setting No   Employed in healthcare setting No   Pregnant No      05/30/19 2003          Vitals/Pain Today's Vitals   05/30/19 1958 05/30/19 2000 05/30/19 2045 05/30/19 2115  BP:  (!) 162/90 (!) 168/81 (!) 163/75  Pulse:  78 85 79  Resp:  14 19 16   Temp:      TempSrc:      SpO2:  100% 98% 99%  Weight:      Height:      PainSc: 0-No pain       Isolation Precautions No active isolations  Medications Medications  amLODipine (NORVASC) tablet 10 mg (has no administration in time range)  atorvastatin (LIPITOR) tablet 10 mg (has no administration in time range)  gabapentin (NEURONTIN) capsule 300 mg (has no administration in time range)  nystatin (MYCOSTATIN/NYSTOP) topical powder 1 application (has no administration in time range)  triamcinolone 0.1 % cream : eucerin cream, 1:1 1 application (has no administration in time range)  heparin injection 5,000 Units (has no administration in time range)  insulin aspart (novoLOG) injection 0-9 Units (has no administration in time range)  insulin aspart (novoLOG) injection 0-5 Units (has no administration in time range)  0.9 %  sodium chloride infusion (has no administration in time range)  insulin glargine (LANTUS) injection 10 Units (has no administration in time range)  cefTRIAXone (ROCEPHIN) 1 g in sodium chloride 0.9 % 100 mL IVPB (has no administration in time range)  methylPREDNISolone sodium succinate (SOLU-MEDROL) 40 mg/mL injection 40 mg (has no administration in time range)  diphenhydrAMINE  (BENADRYL) capsule 25 mg (has no administration in time range)  famotidine (PEPCID) tablet 10 mg (has no administration in time range)  sodium zirconium cyclosilicate (LOKELMA) packet 10 g (has no administration in time range)  sodium chloride 0.9 % bolus 500 mL (0 mLs Intravenous Stopped 05/30/19 1850)  ondansetron (ZOFRAN) injection 4 mg (4 mg Intravenous Given 05/30/19 1819)  lactated ringers bolus 1,000 mL (0 mLs Intravenous Stopped 05/30/19 2134)    Mobility walks with person assist Low fall risk   Focused Assessments Renal Assessment Handoff:   R Recommendations: See Admitting Provider Note  Report given to:   Additional Notes:

## 2019-05-30 NOTE — ED Triage Notes (Signed)
Per EMS- patient c/o itching since last night. Patient states she was prescribed a new medication 2 weeks ago and took this medication 2 days ago. Patient states she had N/V yesterday along with the itching and today just itching.  Patient was given Benadryl 50 mg IV prior to arrival to the ED.

## 2019-05-30 NOTE — ED Notes (Signed)
Dr Rubin Payor in with pt at present time.

## 2019-05-30 NOTE — ED Provider Notes (Signed)
[facility] DEPT Provider Note   CSN: [id number] Arrival date & time: 05/30/19  1733     History Chief Complaint  Patient presents with  . Allergic Reaction    Rachel Gonzalez is a 69 y.o. female.  HPI Patient presents with itching.  Also has had nausea vomiting diarrhea and chills.  States began after starting new medicine.  Bring records appears that she is started Tylenol 3 and Bactrim.  States she started last Tuesday.  States she has not had any of the pain medicine in 2 days now.  Called EMS because she was itching.  They gave her 50 mg of IV Benadryl.  No difficulty breathing.    Past Medical History:  Diagnosis Date  . Anemia   . Arthritis    right finger  . Diabetes mellitus   . GERD (gastroesophageal reflux disease)   . Hypertension   . Renal disorder     Patient Active Problem List   Diagnosis Date Noted  . Stage 3 chronic kidney disease 12/01/2016  . Frequent falls 04/28/2016  . Right arm weakness 04/28/2016  . Bilateral lower extremity edema 04/28/2016  . Screening for colon cancer 04/28/2016  . Abnormal urinalysis 09/13/2014  . Chest pain 09/13/2014  . Abdominal pain, recurrent 09/13/2014  . Dehydration with hyponatremia 09/13/2014  . Acute hyperkalemia 09/13/2014  . Acute renal failure superimposed on stage 3 chronic kidney disease ([place]) 09/13/2014  . Anemia 05/06/2014  . Diabetic neuropathy, type II diabetes mellitus ([place]) 05/06/2014  . Obesity (BMI 30-39.9) 05/06/2014  . Cellulitis of right foot 05/06/2014  . Essential hypertension 03/07/2011  . 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 Right arm pain 08/04/2006  . Osteoarthritis 02/14/2006  . Uncontrolled type 2 diabetes mellitus with hyperglycemia ([place]) 01/11/2006    Past Surgical History:  Procedure Laterality Date  . AMPUTATION Right 05/08/2014   Procedure: AMPUTATION RAY, right great toe;  Surgeon: Newt Minion, MD;  Location: [facility];   Service: Orthopedics;  Laterality: Right;  . arm surgery     . CESAREAN SECTION    . COLONOSCOPY    . I & D EXTREMITY Right 05/08/2014   Procedure: IRRIGATION AND DEBRIDEMENT FOOT;  Surgeon: Newt Minion, MD;  Location: [facility];  Service: Orthopedics;  Laterality: Right;  . MEMBRANE PEEL Right 03/15/2018   Procedure: MEMBRANE PEEL;  Surgeon: Jalene Mullet, MD;  Location: [facility];  Service: Ophthalmology;  Laterality: Right;  . PHOTOCOAGULATION WITH LASER Right 03/15/2018   Procedure: PHOTOCOAGULATION WITH LASER;  Surgeon: Jalene Mullet, MD;  Location: [facility];  Service: Ophthalmology;  Laterality: Right;  . 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 Diabetes Mother     Social History   Tobacco Use  .  Smoking status: Never Smoker  . Smokeless tobacco: Never Used  Substance Use Topics  . Alcohol use: No  . Drug use: No    Home Medications Prior to Admission medications   Medication Sig Start Date End Date Taking? Authorizing Provider  acetaminophen (TYLENOL) 500 MG tablet Take 500-1,000 mg by mouth every 6 (six) hours as needed (pain.).    [provider]  acetaminophen-codeine (TYLENOL #3) 300-30 MG tablet TAKE 1 TABLET BY MOUTH EVERY 6 HOURS AS NEEDED FOR MODERATE PAIN 05/23/19   Dorena Dew, FNP  amLODipine (NORVASC) 10 MG tablet Take 1 tablet (10 mg total) by mouth daily. 05/23/19   Dorena Dew, FNP  atorvastatin (LIPITOR) 10 MG tablet Take 1 tablet (10 mg total) by mouth daily. Patient not taking: Reported on  05/23/2019 11/09/18   Lanae Boast, FNP  Blood Glucose Monitoring Suppl (ACCU-CHEK AVIVA PLUS) w/Device KIT 1 Device by Does not apply route once. Use as directed to check blood sugar 06/11/15   Dorena Dew, FNP  Blood Glucose Monitoring Suppl (ONE TOUCH ULTRA SYSTEM KIT) w/Device KIT Test three times daily. Dx Type 2 Diabetes. 06/10/15   Dorena Dew, FNP  brimonidine (ALPHAGAN) 0.2 % ophthalmic solution Place 1 drop into the right eye 2 (two) times daily. 01/16/19   [provider]  dorzolamide-timolol (COSOPT) 22.3-6.8 MG/ML ophthalmic solution INSTILL 1 DROP INTO RIGHT EYE TWICE DAILY 06/29/18   [provider]  fluticasone (FLONASE) 50 MCG/ACT nasal spray Place 2 sprays into both nostrils daily. Patient not taking: Reported on 05/23/2019 08/31/18   Lanae Boast, FNP  furosemide (LASIX) 20 MG tablet Take 1 tablet (20 mg total) by mouth daily. 05/23/19   Dorena Dew, FNP  gabapentin (NEURONTIN) 300 MG capsule Take 1 capsule (300 mg total) by mouth 2 (two) times daily. 05/02/19   Dorena Dew, FNP  insulin glargine (LANTUS) 100 unit/mL SOPN Inject 0.45 mLs (45 Units total) into the skin daily. 05/23/19   Dorena Dew, FNP  Lancet Devices [facility]) lancets Use as instructed 06/11/15   Dorena Dew, FNP  levocetirizine (XYZAL) 5 MG tablet TAKE ONE TABLET BY MOUTH ONCE DAILY IN THE EVENING Patient not taking: Reported on 05/23/2019 05/11/18   Lanae Boast, FNP  nystatin (NYSTATIN) powder Apply topically 4 (four) times daily. 05/11/18   Lanae Boast, FNP  nystatin ointment (MYCOSTATIN) APPLY 1 APPLICATION OF  OINTMENT TOPICALLY TWICE DAILY 05/02/19   Dorena Dew, FNP  omeprazole (PRILOSEC) 20 MG capsule Take 1 capsule by mouth once daily 05/26/19   Dorena Dew, FNP  ONE TOUCH LANCETS MISC Test 3 times daily with glucometer. Dx Type 2 diabetes with unspecified complications. 06/10/15   Dorena Dew, FNP  RELION PEN NEEDLE 31G/8MM 31G X 8 MM  MISC USE AS DIRECTED 4 TIMES DAILY 12/20/17   Lanae Boast, FNP  sulfamethoxazole-trimethoprim (BACTRIM DS) 800-160 MG tablet Take 1 tablet by mouth 2 (two) times daily. 05/23/19   Dorena Dew, FNP  Triamcinolone Acetonide (TRIAMCINOLONE 0.1 % CREAM : EUCERIN) CREA Apply 1 application topically 3 (three) times daily as needed. 05/23/19   Dorena Dew, FNP    Allergies    Tramadol and Adhesive [tape]  Review of Systems   Review of Systems  Constitutional: Positive for appetite change and chills. Negative for fever.  HENT: Negative for congestion.   Respiratory: Negative for shortness of breath.   Gastrointestinal: Positive for diarrhea, nausea and vomiting.  Genitourinary: Negative  for dysuria.  Musculoskeletal: Negative for back pain.  Skin: Negative for rash.       Itching  Psychiatric/Behavioral: Negative for confusion.    Physical Exam Updated Vital Signs BP (!) 150/93   Pulse 76   Temp 98 F (36.7 C) (Oral)   Resp 16   Ht '5\' 4"'$  (1.626 m)   Wt 99.8 kg   SpO2 99%   BMI 37.76 kg/m   Physical Exam Vitals and nursing note reviewed.  HENT:     Head: Normocephalic.     Mouth/Throat:     Mouth: Mucous membranes are moist.  Eyes:     Extraocular Movements: Extraocular movements intact.  Cardiovascular:     Rate and Rhythm: Regular rhythm.  Pulmonary:     Breath sounds: No wheezing, rhonchi or rales.  Abdominal:     Tenderness: There is no abdominal tenderness.  Musculoskeletal:     Cervical back: Neck supple.     Right lower leg: No edema.     Left lower leg: No edema.  Skin:    General: Skin is warm.     Capillary Refill: Capillary refill takes less than 2 seconds.  Neurological:     Mental Status: She is alert and oriented to person, place, and time.     ED Results / Procedures / Treatments   Labs (all labs ordered are listed, but only abnormal results are displayed) Labs Reviewed  COMPREHENSIVE METABOLIC PANEL - Abnormal; Notable for the  following components:      Result Value   Sodium 133 (*)    Potassium 5.4 (*)    Glucose, Bld 254 (*)    BUN 32 (*)    Creatinine, Ser 2.41 (*)    AST 14 (*)    Total Bilirubin 0.2 (*)    GFR calc non Af Amer 20 (*)    GFR calc Af Amer 23 (*)    All other components within normal limits  CBC WITH DIFFERENTIAL/PLATELET - Abnormal; Notable for the following components:   Hemoglobin 11.6 (*)    All other components within normal limits  URINALYSIS, ROUTINE W REFLEX MICROSCOPIC    EKG None  Radiology No results found.  Procedures Procedures (including critical care time)  Medications Ordered in ED Medications  lactated ringers bolus 1,000 mL (has no administration in time range)  sodium chloride 0.9 % bolus 500 mL (0 mLs Intravenous Stopped 05/30/19 1850)  ondansetron (ZOFRAN) injection 4 mg (4 mg Intravenous Given 05/30/19 1819)    ED Course  I have reviewed the triage vital signs and the nursing notes.  Pertinent labs & imaging results that were available during my care of the patient were reviewed by me and considered in my medical decision making (see chart for details).    MDM Rules/Calculators/A&P                      Patient with nausea vomiting diarrhea and itching.  Patient thought it was allergic reaction.  Could potentially be, but with chills could also be something viral.  Had been treated for cellulitis and states her lower legs are feeling better.  Had been on Bactrim.  However now has worsening kidney function.  Has bumped her creatinine by 1 over the last week.  Still nauseous and does not feel like eating and drinking much.  With acute kidney injury and decreased oral intake feels would benefit from admission to the hospital.  Acute kidney injury could be caused  by dehydration Bactrim or combination of both. Final Clinical Impression(s) / ED Diagnoses Final diagnoses:  Nausea vomiting and diarrhea  AKI (acute kidney injury) [facility])    Rx / [place] Orders ED  Discharge Orders    None       Davonna Belling, MD 05/30/19 2002

## 2019-05-31 LAB — CBC
HCT: 36.2 % (ref 36.0–46.0)
Hemoglobin: 11.1 g/dL — ABNORMAL LOW (ref 12.0–15.0)
MCH: 26.2 pg (ref 26.0–34.0)
MCHC: 30.7 g/dL (ref 30.0–36.0)
MCV: 85.4 fL (ref 80.0–100.0)
Platelets: 249 10*3/uL (ref 150–400)
RBC: 4.24 MIL/uL (ref 3.87–5.11)
RDW: 13.1 % (ref 11.5–15.5)
WBC: 7.3 10*3/uL (ref 4.0–10.5)
nRBC: 0 % (ref 0.0–0.2)

## 2019-05-31 LAB — MAGNESIUM: Magnesium: 2 mg/dL (ref 1.7–2.4)

## 2019-05-31 LAB — BASIC METABOLIC PANEL
Anion gap: 7 (ref 5–15)
BUN: 28 mg/dL — ABNORMAL HIGH (ref 8–23)
CO2: 18 mmol/L — ABNORMAL LOW (ref 22–32)
Calcium: 8.8 mg/dL — ABNORMAL LOW (ref 8.9–10.3)
Chloride: 106 mmol/L (ref 98–111)
Creatinine, Ser: 2.06 mg/dL — ABNORMAL HIGH (ref 0.44–1.00)
GFR calc Af Amer: 28 mL/min — ABNORMAL LOW (ref >60)
GFR calc non Af Amer: 24 mL/min — ABNORMAL LOW (ref >60)
Glucose, Bld: 251 mg/dL — ABNORMAL HIGH (ref 70–99)
Potassium: 5.7 mmol/L — ABNORMAL HIGH (ref 3.5–5.1)
Sodium: 131 mmol/L — ABNORMAL LOW (ref 135–145)

## 2019-05-31 LAB — SARS CORONAVIRUS 2 (TAT 6-24 HRS): SARS Coronavirus 2: NEGATIVE

## 2019-05-31 LAB — GLUCOSE, CAPILLARY
Glucose-Capillary: 164 mg/dL — ABNORMAL HIGH (ref 70–99)
Glucose-Capillary: 286 mg/dL — ABNORMAL HIGH (ref 70–99)
Glucose-Capillary: 287 mg/dL — ABNORMAL HIGH (ref 70–99)
Glucose-Capillary: 342 mg/dL — ABNORMAL HIGH (ref 70–99)
Glucose-Capillary: 355 mg/dL — ABNORMAL HIGH (ref 70–99)

## 2019-05-31 LAB — PHOSPHORUS: Phosphorus: 3 mg/dL (ref 2.5–4.6)

## 2019-05-31 LAB — HIV ANTIBODY (ROUTINE TESTING W REFLEX): HIV Screen 4th Generation wRfx: NONREACTIVE

## 2019-05-31 MED ORDER — INSULIN ASPART 100 UNIT/ML IV SOLN
5.0000 [IU] | Freq: Once | INTRAVENOUS | Status: AC
  Administered 2019-05-31: 5 [IU] via INTRAVENOUS

## 2019-05-31 MED ORDER — SODIUM ZIRCONIUM CYCLOSILICATE 10 G PO PACK
10.0000 g | PACK | Freq: Once | ORAL | Status: AC
  Administered 2019-05-31: 10 g via ORAL
  Filled 2019-05-31: qty 1

## 2019-05-31 NOTE — Progress Notes (Signed)
PROGRESS NOTE    Rachel HOGLAND  Gonzalez:[id number] DOB: 08-26-1950 DOA: 05/30/2019 PCP: Dorena Dew, FNP     Brief Narrative:  Rachel Gonzalez is a 69 year old black female with past medical history significant for diabetes mellitus, hypertension, chronic kidney disease stage IIIb, GERD, arthritis and anemia.  Apparently, patient was started on Bactrim and Tylenol 3 for bilateral cellulitis of the lower extremity 5 days ago.  Earlier today, patient developed hives, with associated nausea vomiting.  According to the patient, she has had about 4 loose to watery stools.  Patient has also vomited about 3-4 times.  No associated fever.  Work-up done revealed worsening renal function, with serum creatinine of 2.41.  Patient's baseline serum creatinine ranges from 1.46-1.72.  Patient was admitted for acute kidney injury, Bactrim was stopped.  New events last 24 hours / Subjective: States that she feels much better than since coming to the hospital.  Tolerating diet.  Having adequate urine output.  Feels that her legs are looking better as well.  Itching has improved but still has some itching on the palm especially on the right hand.  Assessment & Plan:   Active Problems:   Acute kidney injury superimposed on chronic kidney disease (HCC)   Acute kidney injury on chronic kidney disease stage IIIb -This is likely secondary to combined effect of Bactrim use and volume depletion. -Baseline creatinine 1.4-1.7.  Presented with creatinine 2.41 -Hold Bactrim -Continue IV fluids and monitor BMP.  AKI improving  Hyperkalemia -Will give Lokelma and insulin/dextrose today -Monitor BMP  Diabetes mellitus  -Lantus, sliding scale insulin  Hypertension -Continue Norvasc  Hyperlipidemia -Continue Lipitor  Cellulitis of lower extremities -Improving on examination, continue Rocephin   DVT prophylaxis: Subcutaneous heparin Code Status: Full code Family Communication: At bedside Disposition  Plan:  . Patient is from home prior to admission. . Currently in-hospital treatment needed due to continue treatment for acute kidney injury and monitoring BMP.  Remains on Rocephin for cellulitis.  Hyperkalemia being treated and monitored.. . Suspect patient will discharge home hopefully 3/25 if BMP normal.    Consultants:   None  Procedures:   None  Antimicrobials:  Anti-infectives (From admission, onward)   Start     Dose/Rate Route Frequency Ordered Stop   05/30/19 2215  cefTRIAXone (ROCEPHIN) 1 g in sodium chloride 0.9 % 100 mL IVPB     1 g 200 mL/hr over 30 Minutes Intravenous Daily at bedtime 05/30/19 2203          Objective: Vitals:   05/30/19 2200 05/30/19 2224 05/30/19 2229 05/31/19 0559  BP: (!) 156/69 (!) 174/79  (!) 168/77  Pulse: 73 83  73  Resp: 15 18  18   Temp:  98.9 F (37.2 C)  99 F (37.2 C)  TempSrc:  Oral  Oral  SpO2: 100% 97%  97%  Weight:   99.8 kg   Height:        Intake/Output Summary (Last 24 hours) at 05/31/2019 1330 Last data filed at 05/31/2019 0300 Gross per 24 hour  Intake 1751.84 ml  Output --  Net 1751.84 ml   Filed Weights   05/30/19 1751 05/30/19 2229  Weight: 99.8 kg 99.8 kg    Examination:  General exam: Appears calm and comfortable  Respiratory system: Clear to auscultation. Respiratory effort normal. No respiratory distress. No conversational dyspnea.  Cardiovascular system: S1 & S2 heard, RRR. No murmurs. No pedal edema. Gastrointestinal system: Abdomen is nondistended, soft and nontender. Normal bowel sounds heard.  Central nervous system: Alert and oriented. No focal neurological deficits. Speech clear.  Extremities: Symmetric in appearance  Skin: Mild erythema bilateral lower extremities with warmth to touch Psychiatry: Judgement and insight appear normal. Mood & affect appropriate.   Data Reviewed: I have personally reviewed following labs and imaging studies  CBC: Recent Labs  Lab 05/30/19 1821 05/31/19 0505   WBC 6.7 7.3  NEUTROABS 5.0  --   HGB 11.6* 11.1*  HCT 37.5 36.2  MCV 85.0 85.4  PLT 240 249   Basic Metabolic Panel: Recent Labs  Lab 05/30/19 1821 05/31/19 0505  NA 133* 131*  K 5.4* 5.7*  CL 103 106  CO2 23 18*  GLUCOSE 254* 251*  BUN 32* 28*  CREATININE 2.41* 2.06*  CALCIUM 9.0 8.8*  MG  --  2.0  PHOS  --  3.0   GFR: Estimated Creatinine Clearance: 30.4 mL/min (A) (by C-G formula based on SCr of 2.06 mg/dL (H)). Liver Function Tests: Recent Labs  Lab 05/30/19 1821  AST 14*  ALT 11  ALKPHOS 121  BILITOT 0.2*  PROT 7.5  ALBUMIN 3.5   No results for input(s): LIPASE, AMYLASE in the last 168 hours. No results for input(s): AMMONIA in the last 168 hours. Coagulation Profile: No results for input(s): INR, PROTIME in the last 168 hours. Cardiac Enzymes: No results for input(s): CKTOTAL, CKMB, CKMBINDEX, TROPONINI in the last 168 hours. BNP (last 3 results) No results for input(s): PROBNP in the last 8760 hours. HbA1C: No results for input(s): HGBA1C in the last 72 hours. CBG: Recent Labs  Lab 05/30/19 2242 05/31/19 0726 05/31/19 1141  GLUCAP 164* 286* 287*   Lipid Profile: No results for input(s): CHOL, HDL, LDLCALC, TRIG, CHOLHDL, LDLDIRECT in the last 72 hours. Thyroid Function Tests: No results for input(s): TSH, T4TOTAL, FREET4, T3FREE, THYROIDAB in the last 72 hours. Anemia Panel: No results for input(s): VITAMINB12, FOLATE, FERRITIN, TIBC, IRON, RETICCTPCT in the last 72 hours. Sepsis Labs: No results for input(s): PROCALCITON, LATICACIDVEN in the last 168 hours.  Recent Results (from the past 240 hour(s))  SARS CORONAVIRUS 2 (TAT 6-24 HRS) Nasopharyngeal Nasopharyngeal Swab     Status: None   Collection Time: 05/30/19  8:09 PM   Specimen: Nasopharyngeal Swab  Result Value Ref Range Status   SARS Coronavirus 2 NEGATIVE NEGATIVE Final    Comment: (NOTE) SARS-CoV-2 target nucleic acids are NOT DETECTED. The SARS-CoV-2 RNA is generally  detectable in upper and lower respiratory specimens during the acute phase of infection. Negative results do not preclude SARS-CoV-2 infection, do not rule out co-infections with other pathogens, and should not be used as the sole basis for treatment or other patient management decisions. Negative results must be combined with clinical observations, patient history, and epidemiological information. The expected result is Negative. Fact Sheet for Patients: HairSlick.no Fact Sheet for Healthcare Providers: quierodirigir.com This test is not yet approved or cleared by the [place] FDA and  has been authorized for detection and/or diagnosis of SARS-CoV-2 by FDA under an Emergency Use Authorization (EUA). This EUA will remain  in effect (meaning this test can be used) for the duration of the COVID-19 declaration under Section 56 4(b)(1) of the Act, 21 U.S.C. section 360bbb-3(b)(1), unless the authorization is terminated or revoked sooner. Performed at [facility], [address] [place], [name] [address]       Radiology Studies: No results found.    Scheduled Meds: . amLODipine  10 mg Oral Daily  .  atorvastatin  10 mg Oral Daily  . famotidine  10 mg Oral Daily  . gabapentin  300 mg Oral QHS  . heparin  5,000 Units Subcutaneous Q8H  . insulin aspart  0-5 Units Subcutaneous QHS  . insulin aspart  0-9 Units Subcutaneous TID WC  . insulin glargine  10 Units Subcutaneous QHS  . methylPREDNISolone (SOLU-MEDROL) injection  40 mg Intravenous Q8H   Continuous Infusions: . sodium chloride 75 mL/hr at 05/31/19 0300  . cefTRIAXone (ROCEPHIN)  IV Stopped (05/31/19 0204)     LOS: 1 day      Time spent: 35 minutes   Noralee Stain, DO [facility] 05/31/2019, 1:30 PM   Available via Epic secure chat 7am-7pm After these hours, please refer to coverage provider listed on [email or web address]

## 2019-06-01 LAB — BASIC METABOLIC PANEL
Anion gap: 9 (ref 5–15)
BUN: 33 mg/dL — ABNORMAL HIGH (ref 8–23)
CO2: 19 mmol/L — ABNORMAL LOW (ref 22–32)
Calcium: 9.1 mg/dL (ref 8.9–10.3)
Chloride: 107 mmol/L (ref 98–111)
Creatinine, Ser: 2.06 mg/dL — ABNORMAL HIGH (ref 0.44–1.00)
GFR calc Af Amer: 28 mL/min — ABNORMAL LOW (ref >60)
GFR calc non Af Amer: 24 mL/min — ABNORMAL LOW (ref >60)
Glucose, Bld: 383 mg/dL — ABNORMAL HIGH (ref 70–99)
Potassium: 4.7 mmol/L (ref 3.5–5.1)
Sodium: 135 mmol/L (ref 135–145)

## 2019-06-01 LAB — GLUCOSE, CAPILLARY: Glucose-Capillary: 352 mg/dL — ABNORMAL HIGH (ref 70–99)

## 2019-06-01 MED ORDER — INSULIN GLARGINE 100 UNIT/ML ~~LOC~~ SOLN
15.0000 [IU] | Freq: Every day | SUBCUTANEOUS | Status: DC
  Filled 2019-06-01: qty 0.15

## 2019-06-01 MED ORDER — INSULIN ASPART 100 UNIT/ML ~~LOC~~ SOLN
5.0000 [IU] | Freq: Three times a day (TID) | SUBCUTANEOUS | Status: DC
  Administered 2019-06-01: 10:00:00 5 [IU] via SUBCUTANEOUS

## 2019-06-01 MED ORDER — CEPHALEXIN 500 MG PO CAPS: 500.0000 mg | ORAL_CAPSULE | Freq: Four times a day (QID) | ORAL | 0 refills | Status: AC

## 2019-06-01 NOTE — Discharge Summary (Signed)
Physician Discharge Summary  Rachel Gonzalez ESP:[id number] DOB: August 12, 1950 DOA: 05/30/2019  PCP: Dorena Dew, FNP  Admit date: 05/30/2019 Discharge date: 06/01/2019  Admitted From: Home Disposition:  Home   Recommendations for Outpatient Follow-up:  1. Follow up with PCP in 1 week 2. Please obtain repeat BMP in 1 week   Discharge Condition: Stable CODE STATUS: Full  Diet recommendation: Carb modified   Brief/Interim Summary: Rachel Gonzalez is a 69 year old black female with past medical history significant for diabetes mellitus, hypertension, chronic kidney disease stage IIIb, GERD, arthritis and anemia. Apparently, patient was started on Bactrim and Tylenol 3 for bilateral cellulitis of the lower extremity 5 days ago. Earlier today, patient developed hives, with associated nausea vomiting. According to the patient, she has had about 4 loose to watery stools. Patient has also vomited about 3-4 times. No associated fever. Work-up done revealed worsening renal function, with serum creatinine of 2.41. Patient's baseline serum creatinine ranges from 1.46-1.72.  Patient was admitted for acute kidney injury, Bactrim and lasix was stopped.  Discharge Diagnoses:  Active Problems:   Acute kidney injury superimposed on chronic kidney disease (HCC)   Acute kidney injury on chronic kidney disease stage IIIb -This is likely secondary to combined effect of Bactrim use and volume depletion. -Baseline creatinine 1.4-1.7.  Presented with creatinine 2.41 -Hold Bactrim, lasix  -Cr improved, 2.06 this morning. Stop IVF. Patient able to take PO hydration, urinating well, improved diarrhea. Patient may dc home and follow up with PCP for repeat BMP in 1 week. I've advised her to stop bactrim and hold off on lasix until follow up with PCP   Hyperkalemia -Resolved   Diabetes mellitus  -Lantus, sliding scale insulin  Hypertension -Continue Norvasc  Hyperlipidemia -Continue  Lipitor  Cellulitis of lower extremities -Improving on examination, Rocephin --> Keflex on discharge    Discharge Instructions  Discharge Instructions    Call MD for:  difficulty breathing, headache or visual disturbances   Complete by: As directed    Call MD for:  extreme fatigue   Complete by: As directed    Call MD for:  hives   Complete by: As directed    Call MD for:  persistant dizziness or light-headedness   Complete by: As directed    Call MD for:  persistant nausea and vomiting   Complete by: As directed    Call MD for:  severe uncontrolled pain   Complete by: As directed    Call MD for:  temperature >100.4   Complete by: As directed    Diet Carb Modified   Complete by: As directed    Discharge instructions   Complete by: As directed    You were cared for by a hospitalist during your hospital stay. If you have any questions about your discharge medications or the care you received while you were in the hospital after you are discharged, you can call the unit and ask to speak with the hospitalist on call if the hospitalist that took care of you is not available. Once you are discharged, your primary care physician will handle any further medical issues. Please note that NO REFILLS for any discharge medications will be authorized once you are discharged, as it is imperative that you return to your primary care physician (or establish a relationship with a primary care physician if you do not have one) for your aftercare needs so that they can reassess your need for medications and monitor your lab values.  Increase activity slowly   Complete by: As directed      Allergies as of 06/01/2019      Reactions   Tramadol Nausea And Vomiting   Adhesive [tape] Itching   Bactrim [sulfamethoxazole-trimethoprim] Other (See Comments)   AKI and hyperkalemia    Tylenol With Codeine #3 [acetaminophen-codeine] Diarrhea, Itching      Medication List    STOP taking these medications    fluticasone 50 MCG/ACT nasal spray Commonly known as: FLONASE   furosemide 20 MG tablet Commonly known as: LASIX   levocetirizine 5 MG tablet Commonly known as: XYZAL     TAKE these medications   accu-chek softclix lancets Use as instructed   acetaminophen 500 MG tablet Commonly known as: TYLENOL Take 500-1,000 mg by mouth every 6 (six) hours as needed (pain.).   acetaminophen-codeine 300-30 MG tablet Commonly known as: TYLENOL #3 TAKE 1 TABLET BY MOUTH EVERY 6 HOURS AS NEEDED FOR MODERATE PAIN   amLODipine 10 MG tablet Commonly known as: NORVASC Take 1 tablet (10 mg total) by mouth daily.   atorvastatin 10 MG tablet Commonly known as: LIPITOR Take 1 tablet (10 mg total) by mouth daily.   cephALEXin 500 MG capsule Commonly known as: KEFLEX Take 1 capsule (500 mg total) by mouth 4 (four) times daily for 5 days.   gabapentin 300 MG capsule Commonly known as: NEURONTIN Take 1 capsule (300 mg total) by mouth 2 (two) times daily.   insulin glargine 100 unit/mL Sopn Commonly known as: LANTUS Inject 0.45 mLs (45 Units total) into the skin daily.   nystatin powder Commonly known as: nystatin Apply topically 4 (four) times daily. What changed:   how much to take  when to take this  reasons to take this   nystatin ointment Commonly known as: MYCOSTATIN APPLY 1 APPLICATION OF  OINTMENT TOPICALLY TWICE DAILY What changed:   how much to take  how to take this  when to take this  reasons to take this  additional instructions   omeprazole 20 MG capsule Commonly known as: PRILOSEC Take 1 capsule by mouth once daily   ONE TOUCH LANCETS Misc Test 3 times daily with glucometer. Dx Type 2 diabetes with unspecified complications.   ONE TOUCH ULTRA SYSTEM KIT w/Device Kit Test three times daily. Dx Type 2 Diabetes.   Accu-Chek Aviva Plus w/Device Kit 1 Device by Does not apply route once. Use as directed to check blood sugar   RELION PEN NEEDLE 31G/8MM 31G  X 8 MM Misc Generic drug: Insulin Pen Needle USE AS DIRECTED 4 TIMES DAILY   Tresiba FlexTouch 200 UNIT/ML FlexTouch Pen Generic drug: insulin degludec Inject 44 Units into the skin daily.   triamcinolone 0.1 % cream : eucerin Crea Apply 1 application topically 3 (three) times daily as needed. What changed: reasons to take this      Follow-up Information    Dorena Dew, FNP. Schedule an appointment as soon as possible for a visit in 1 week(s).   Specialty: Family Medicine Why: Follow up with BMP in 1 week. Stop bactrim. Hold off on lasix until follow up blood work and cleared by PCP to resume this medication.  Contact information: [address] [place] [address] [phone]          Allergies  Allergen Reactions  . Tramadol Nausea And Vomiting  . Adhesive [Tape] Itching  . Bactrim [Sulfamethoxazole-Trimethoprim] Other (See Comments)    AKI and hyperkalemia   . Tylenol  With Codeine #3 [Acetaminophen-Codeine] Diarrhea and Itching    Consultations:  None   Procedures/Studies: No results found.    Discharge Exam: Vitals:   05/31/19 1338 05/31/19 2154  BP: (!) 157/74 (!) 160/73  Pulse: 83 87  Resp: 18 14  Temp: 98.3 F (36.8 C) 98.5 F (36.9 C)  SpO2: 94% 99%     General: Pt is alert, awake, not in acute distress Cardiovascular: RRR, S1/S2 +, no edema Respiratory: CTA bilaterally, no wheezing, no rhonchi, no respiratory distress, no conversational dyspnea  Abdominal: Soft, NT, ND, bowel sounds + Extremities: no edema, no cyanosis Skin: mild erythema bilateral LEs , improving  Psych: Normal mood and affect, stable judgement and insight     The results of significant diagnostics from this hospitalization (including imaging, microbiology, ancillary and laboratory) are listed below for reference.     Microbiology: Recent Results (from the past 240 hour(s))  SARS CORONAVIRUS 2 (TAT 6-24 HRS) Nasopharyngeal Nasopharyngeal Swab      Status: None   Collection Time: 05/30/19  8:09 PM   Specimen: Nasopharyngeal Swab  Result Value Ref Range Status   SARS Coronavirus 2 NEGATIVE NEGATIVE Final    Comment: (NOTE) SARS-CoV-2 target nucleic acids are NOT DETECTED. The SARS-CoV-2 RNA is generally detectable in upper and lower respiratory specimens during the acute phase of infection. Negative results do not preclude SARS-CoV-2 infection, do not rule out co-infections with other pathogens, and should not be used as the sole basis for treatment or other patient management decisions. Negative results must be combined with clinical observations, patient history, and epidemiological information. The expected result is Negative. Fact Sheet for Patients: SugarRoll.be Fact Sheet for Healthcare Providers: https://www.woods-mathews.com/ This test is not yet approved or cleared by the [place] FDA and  has been authorized for detection and/or diagnosis of SARS-CoV-2 by FDA under an Emergency Use Authorization (EUA). This EUA will remain  in effect (meaning this test can be used) for the duration of the COVID-19 declaration under Section 56 4(b)(1) of the Act, 21 U.S.C. section 360bbb-3(b)(1), unless the authorization is terminated or revoked sooner. Performed at [facility], [place] [address], [place], [place] [address]      Labs: BNP (last 3 results) Recent Labs    11/23/18 1600  BNP 09.4   Basic Metabolic Panel: Recent Labs  Lab 05/30/19 1821 05/31/19 0505 06/01/19 0525  NA 133* 131* 135  K 5.4* 5.7* 4.7  CL 103 106 107  CO2 23 18* 19*  GLUCOSE 254* 251* 383*  BUN 32* 28* 33*  CREATININE 2.41* 2.06* 2.06*  CALCIUM 9.0 8.8* 9.1  MG  --  2.0  --   PHOS  --  3.0  --    Liver Function Tests: Recent Labs  Lab 05/30/19 1821  AST 14*  ALT 11  ALKPHOS 121  BILITOT 0.2*  PROT 7.5  ALBUMIN 3.5   No results for input(s): LIPASE, AMYLASE in the last 168  hours. No results for input(s): AMMONIA in the last 168 hours. CBC: Recent Labs  Lab 05/30/19 1821 05/31/19 0505  WBC 6.7 7.3  NEUTROABS 5.0  --   HGB 11.6* 11.1*  HCT 37.5 36.2  MCV 85.0 85.4  PLT 240 249   Cardiac Enzymes: No results for input(s): CKTOTAL, CKMB, CKMBINDEX, TROPONINI in the last 168 hours. BNP: Invalid input(s): POCBNP CBG: Recent Labs  Lab 05/31/19 0726 05/31/19 1141 05/31/19 1700 05/31/19 2201 06/01/19 0720  GLUCAP 286* 287* 342* 355* 352*  D-Dimer No results for input(s): DDIMER in the last 72 hours. Hgb A1c No results for input(s): HGBA1C in the last 72 hours. Lipid Profile No results for input(s): CHOL, HDL, LDLCALC, TRIG, CHOLHDL, LDLDIRECT in the last 72 hours. Thyroid function studies No results for input(s): TSH, T4TOTAL, T3FREE, THYROIDAB in the last 72 hours.  Invalid input(s): FREET3 Anemia work up No results for input(s): VITAMINB12, FOLATE, FERRITIN, TIBC, IRON, RETICCTPCT in the last 72 hours. Urinalysis    Component Value Date/Time   COLORURINE YELLOW 05/30/2019 1806   APPEARANCEUR HAZY (A) 05/30/2019 1806   LABSPEC 1.014 05/30/2019 1806   PHURINE 5.0 05/30/2019 1806   GLUCOSEU 50 (A) 05/30/2019 1806   HGBUR SMALL (A) 05/30/2019 1806   BILIRUBINUR NEGATIVE 05/30/2019 1806   BILIRUBINUR neg 05/23/2019 0927   KETONESUR NEGATIVE 05/30/2019 1806   PROTEINUR 100 (A) 05/30/2019 1806   UROBILINOGEN 0.2 05/23/2019 0927   UROBILINOGEN 0.2 04/01/2017 0837   NITRITE NEGATIVE 05/30/2019 1806   LEUKOCYTESUR TRACE (A) 05/30/2019 1806   Sepsis Labs Invalid input(s): PROCALCITONIN,  WBC,  LACTICIDVEN Microbiology Recent Results (from the past 240 hour(s))  SARS CORONAVIRUS 2 (TAT 6-24 HRS) Nasopharyngeal Nasopharyngeal Swab     Status: None   Collection Time: 05/30/19  8:09 PM   Specimen: Nasopharyngeal Swab  Result Value Ref Range Status   SARS Coronavirus 2 NEGATIVE NEGATIVE Final    Comment: (NOTE) SARS-CoV-2 target nucleic  acids are NOT DETECTED. The SARS-CoV-2 RNA is generally detectable in upper and lower respiratory specimens during the acute phase of infection. Negative results do not preclude SARS-CoV-2 infection, do not rule out co-infections with other pathogens, and should not be used as the sole basis for treatment or other patient management decisions. Negative results must be combined with clinical observations, patient history, and epidemiological information. The expected result is Negative. Fact Sheet for Patients: SugarRoll.be Fact Sheet for Healthcare Providers: https://www.woods-mathews.com/ This test is not yet approved or cleared by the [place] FDA and  has been authorized for detection and/or diagnosis of SARS-CoV-2 by FDA under an Emergency Use Authorization (EUA). This EUA will remain  in effect (meaning this test can be used) for the duration of the COVID-19 declaration under Section 56 4(b)(1) of the Act, 21 U.S.C. section 360bbb-3(b)(1), unless the authorization is terminated or revoked sooner. Performed at [facility], [place] [address] [place], [place] [address]      Patient was seen and examined on the day of discharge and was found to be in stable condition. Time coordinating discharge: 25 minutes including assessment and coordination of care, as well as examination of the patient.   SIGNED:  Dessa Phi, DO [facility] 06/01/2019, 10:05 AM

## 2019-06-01 NOTE — Progress Notes (Signed)
Pt VS stable.  Discharge instructions provided to and reviewed with patient.  PIV discontinued.  Cathetar intact and clotting within expected timeframe.  Patient awaiting wheelchair transport to front entrance.

## 2019-06-05 ENCOUNTER — Ambulatory Visit (INDEPENDENT_AMBULATORY_CARE_PROVIDER_SITE_OTHER): Payer: Medicare HMO

## 2019-06-05 ENCOUNTER — Ambulatory Visit (INDEPENDENT_AMBULATORY_CARE_PROVIDER_SITE_OTHER): Payer: Medicare HMO | Admitting: Orthopedic Surgery

## 2019-06-05 ENCOUNTER — Other Ambulatory Visit: Payer: Self-pay

## 2019-06-05 DIAGNOSIS — M25511 Pain in right shoulder: Secondary | ICD-10-CM

## 2019-06-05 DIAGNOSIS — M25561 Pain in right knee: Secondary | ICD-10-CM

## 2019-06-05 MED ORDER — MELOXICAM 15 MG PO TABS: 15.0000 mg | ORAL_TABLET | Freq: Every day | ORAL | 0 refills | Status: DC

## 2019-06-06 ENCOUNTER — Ambulatory Visit: Payer: Medicare HMO | Attending: Internal Medicine

## 2019-06-06 DIAGNOSIS — Z23 Encounter for immunization: Secondary | ICD-10-CM

## 2019-06-06 NOTE — Progress Notes (Signed)
   Covid-19 Vaccination Clinic  Name:  LASHAWNTA BURGERT    MRN: [id number] DOB: 1950/10/02  06/06/2019  Ms. Akter was observed post Covid-19 immunization for 15 minutes without incident. She was provided with Vaccine Information Sheet and instruction to access the V-Safe system.   Ms. Sabourin was instructed to call 911 with any severe reactions post vaccine: Marland Kitchen Difficulty breathing  . Swelling of face and throat  . A fast heartbeat  . A bad rash all over body  . Dizziness and weakness   Immunizations Administered    Name Date Dose VIS Date Route   Pfizer COVID-19 Vaccine 06/06/2019  8:56 AM 0.3 mL 02/17/2019 Intramuscular   Manufacturer: [employer], [employer]   Lot: [id number]   NDC: [address]-2

## 2019-06-08 ENCOUNTER — Ambulatory Visit: Payer: Medicare HMO | Admitting: Nurse Practitioner

## 2019-06-11 ENCOUNTER — Encounter: Payer: Self-pay | Admitting: Orthopedic Surgery
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
## 2019-06-23 ENCOUNTER — Other Ambulatory Visit: Payer: Medicare HMO

## 2019-06-26 ENCOUNTER — Other Ambulatory Visit: Payer: Self-pay

## 2019-06-26 ENCOUNTER — Other Ambulatory Visit: Payer: Medicare HMO

## 2019-06-26 DIAGNOSIS — E1165 Type 2 diabetes mellitus with hyperglycemia: Secondary | ICD-10-CM | POA: Diagnosis not present

## 2019-06-26 DIAGNOSIS — I10 Essential (primary) hypertension: Secondary | ICD-10-CM | POA: Diagnosis not present

## 2019-06-27 LAB — CBC WITH DIFFERENTIAL/PLATELET
Basophils Absolute: 0 10*3/uL (ref 0.0–0.2)
Basos: 1 %
EOS (ABSOLUTE): 0.1 10*3/uL (ref 0.0–0.4)
Eos: 2 %
Hematocrit: 32.7 % — ABNORMAL LOW (ref 34.0–46.6)
Hemoglobin: 10 g/dL — ABNORMAL LOW (ref 11.1–15.9)
Immature Grans (Abs): 0 10*3/uL (ref 0.0–0.1)
Immature Granulocytes: 0 %
Lymphocytes Absolute: 1.9 10*3/uL (ref 0.7–3.1)
Lymphs: 32 %
MCH: 25.8 pg — ABNORMAL LOW (ref 26.6–33.0)
MCHC: 30.6 g/dL — ABNORMAL LOW (ref 31.5–35.7)
MCV: 84 fL (ref 79–97)
Monocytes Absolute: 0.3 10*3/uL (ref 0.1–0.9)
Monocytes: 6 %
Neutrophils Absolute: 3.6 10*3/uL (ref 1.4–7.0)
Neutrophils: 59 %
Platelets: 268 10*3/uL (ref 150–450)
RBC: 3.88 x10E6/uL (ref 3.77–5.28)
RDW: 12.5 % (ref 11.7–15.4)
WBC: 6 10*3/uL (ref 3.4–10.8)

## 2019-06-27 LAB — LIPID PANEL
Chol/HDL Ratio: 2 ratio (ref 0.0–4.4)
Cholesterol, Total: 156 mg/dL (ref 100–199)
HDL: 78 mg/dL (ref >39)
LDL Chol Calc (NIH): 68 mg/dL (ref 0–99)
Triglycerides: 43 mg/dL (ref 0–149)
VLDL Cholesterol Cal: 10 mg/dL (ref 5–40)

## 2019-06-27 LAB — COMPREHENSIVE METABOLIC PANEL
ALT: 6 IU/L (ref 0–32)
AST: 9 IU/L (ref 0–40)
Albumin/Globulin Ratio: 1.5 (ref 1.2–2.2)
Albumin: 3.7 g/dL — ABNORMAL LOW (ref 3.8–4.8)
Alkaline Phosphatase: 186 IU/L — ABNORMAL HIGH (ref 39–117)
BUN/Creatinine Ratio: 14 (ref 12–28)
BUN: 24 mg/dL (ref 8–27)
Bilirubin Total: <0.2 mg/dL (ref 0.0–1.2)
CO2: 22 mmol/L (ref 20–29)
Calcium: 8.8 mg/dL (ref 8.7–10.3)
Chloride: 106 mmol/L (ref 96–106)
Creatinine, Ser: 1.72 mg/dL — ABNORMAL HIGH (ref 0.57–1.00)
GFR calc Af Amer: 35 mL/min/{1.73_m2} — ABNORMAL LOW (ref >59)
GFR calc non Af Amer: 30 mL/min/{1.73_m2} — ABNORMAL LOW (ref >59)
Globulin, Total: 2.5 g/dL (ref 1.5–4.5)
Glucose: 215 mg/dL — ABNORMAL HIGH (ref 65–99)
Potassium: 4.7 mmol/L (ref 3.5–5.2)
Sodium: 140 mmol/L (ref 134–144)
Total Protein: 6.2 g/dL (ref 6.0–8.5)

## 2019-06-29 ENCOUNTER — Telehealth: Payer: Self-pay

## 2019-06-29 NOTE — Telephone Encounter (Signed)
Called and made aware that renal function has improved and that we will continue to monitor. She does have an appointment scheduled in 08/2019. She was asked to keep this appointment. Thanks!

## 2019-06-29 NOTE — Telephone Encounter (Signed)
-----   Message from Massie Maroon, [place] sent at 06/28/2019  4:53 PM EDT ----- Regarding: lab results Please inform Rachel Gonzalez that her renal function has improved significantly.  Will continue to monitor closely. Ensure that patient has a follow up appointment scheduled with me.   Nolon Nations  APRN, MSN, FNP-C Patient Care [facility] [address] [place], [name] [address] [phone]

## 2019-07-03 ENCOUNTER — Other Ambulatory Visit: Payer: Self-pay

## 2019-07-03 ENCOUNTER — Encounter: Payer: Self-pay | Admitting: Orthopedic Surgery

## 2019-07-03 ENCOUNTER — Ambulatory Visit (INDEPENDENT_AMBULATORY_CARE_PROVIDER_SITE_OTHER): Payer: Medicare HMO | Admitting: Orthopedic Surgery

## 2019-07-03 DIAGNOSIS — M25511 Pain in right shoulder: Secondary | ICD-10-CM | POA: Diagnosis not present
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
## 2019-08-14 ENCOUNTER — Other Ambulatory Visit: Payer: Self-pay | Admitting: Family Medicine

## 2019-08-14 DIAGNOSIS — J309 Allergic rhinitis, unspecified: Secondary | ICD-10-CM

## 2019-08-14 DIAGNOSIS — E1165 Type 2 diabetes mellitus with hyperglycemia: Secondary | ICD-10-CM

## 2019-08-19 ENCOUNTER — Other Ambulatory Visit: Payer: Self-pay | Admitting: Family Medicine

## 2019-08-19 DIAGNOSIS — L304 Erythema intertrigo: Secondary | ICD-10-CM

## 2019-08-21 NOTE — Telephone Encounter (Signed)
Would you like to refill this rx. This was last filled 05/11/18 by Debby Bud on 05/11/18 for qty: 15 g Rf #5.

## 2019-08-25 ENCOUNTER — Other Ambulatory Visit: Payer: Self-pay

## 2019-08-25 MED ORDER — TRESIBA FLEXTOUCH 200 UNIT/ML ~~LOC~~ SOPN: 44.0000 [IU] | PEN_INJECTOR | Freq: Every day | SUBCUTANEOUS | 0 refills | Status: DC

## 2019-08-29 ENCOUNTER — Encounter: Payer: Self-pay | Admitting: Family Medicine

## 2019-08-29 ENCOUNTER — Telehealth: Payer: Self-pay

## 2019-08-29 ENCOUNTER — Other Ambulatory Visit: Payer: Self-pay

## 2019-08-29 ENCOUNTER — Ambulatory Visit (INDEPENDENT_AMBULATORY_CARE_PROVIDER_SITE_OTHER): Payer: Medicare HMO | Admitting: Family Medicine

## 2019-08-29 VITALS — BP 162/64 | HR 74 | Temp 97.3°F | Ht 64.0 in | Wt 225.4 lb

## 2019-08-29 DIAGNOSIS — L304 Erythema intertrigo: Secondary | ICD-10-CM | POA: Diagnosis not present

## 2019-08-29 DIAGNOSIS — E1165 Type 2 diabetes mellitus with hyperglycemia: Secondary | ICD-10-CM

## 2019-08-29 DIAGNOSIS — R82998 Other abnormal findings in urine: Secondary | ICD-10-CM

## 2019-08-29 DIAGNOSIS — F439 Reaction to severe stress, unspecified: Secondary | ICD-10-CM

## 2019-08-29 DIAGNOSIS — E669 Obesity, unspecified: Secondary | ICD-10-CM

## 2019-08-29 DIAGNOSIS — I10 Essential (primary) hypertension: Secondary | ICD-10-CM | POA: Diagnosis not present

## 2019-08-29 DIAGNOSIS — N1831 Chronic kidney disease, stage 3a: Secondary | ICD-10-CM

## 2019-08-29 LAB — POCT URINALYSIS DIPSTICK
Bilirubin, UA: NEGATIVE
Glucose, UA: POSITIVE — AB
Ketones, UA: NEGATIVE
Nitrite, UA: NEGATIVE
Protein, UA: POSITIVE — AB
Spec Grav, UA: 1.02 (ref 1.010–1.025)
Urobilinogen, UA: 0.2 E.U./dL
pH, UA: 6 (ref 5.0–8.0)

## 2019-08-29 LAB — POCT GLYCOSYLATED HEMOGLOBIN (HGB A1C)
HbA1c POC (<> result, manual entry): 10.5 % (ref 4.0–5.6)
HbA1c, POC (controlled diabetic range): 10.5 % — AB (ref 0.0–7.0)
HbA1c, POC (prediabetic range): 10.5 % — AB (ref 5.7–6.4)
Hemoglobin A1C: 10.5 % — AB (ref 4.0–5.6)

## 2019-08-29 MED ORDER — NYSTATIN 100000 UNIT/GM EX OINT: TOPICAL_OINTMENT | CUTANEOUS | 3 refills | Status: DC

## 2019-08-29 MED ORDER — NYSTATIN 100000 UNIT/GM EX POWD: Freq: Three times a day (TID) | CUTANEOUS | 3 refills | Status: DC

## 2019-08-29 NOTE — Progress Notes (Signed)
Integrated Behavioral Health Case Management Referral Note  08/29/2019 Name: Rachel Gonzalez MRN: [id number] DOB: Jan 05, 1951 Rachel Gonzalez is a 69 y.o. year old female who sees Dorena Dew, FNP for primary care. LCSW was consulted to assess patient's needs and assist the patient with Mental Health Counseling and Resources.  Interpreter: No.   Interpreter Name & Language: none  Assessment: Patient experiencing Financial constraints related to low income. PCP also referred for mental health and illness adjustment issues. Patient PHQ2 score from this morning 0. CSW met with patient during PCP visit for brief assessment. Patient had difficulty articulating her experience, though expressed that she does have some challenges managing her diabetes. Reported feeling more relaxed and comfortable at the hospital. Denied abuse or excessive arguments at home. Reported having transportation and housing.  Reported difficulty in obtaining healthy foods due to low income. Was denied for food stamps two years ago. Was denied for Medicaid a few months ago (does have coverage with [facility] Medicare). Planned to assist patient with new food stamps application this week. Will also plan to check in regarding mental health and adjustment.     Review of patient status, including review of consultants reports, relevant laboratory and other test results, and collaboration with appropriate care team members and the patient's provider was performed as part of comprehensive patient evaluation and provision of services.    SDOH (Social Determinants of Health) assessments performed: No    Outpatient Encounter Medications as of 08/29/2019  Medication Sig Note  . acetaminophen (TYLENOL) 500 MG tablet Take 500-1,000 mg by mouth every 6 (six) hours as needed (pain.).   Marland Kitchen amLODipine (NORVASC) 10 MG tablet Take 1 tablet (10 mg total) by mouth daily.   Marland Kitchen atorvastatin (LIPITOR) 10 MG tablet Take 1 tablet (10 mg total) by mouth  daily.   . Blood Glucose Monitoring Suppl (ACCU-CHEK AVIVA PLUS) w/Device KIT 1 Device by Does not apply route once. Use as directed to check blood sugar   . Blood Glucose Monitoring Suppl (ONE TOUCH ULTRA SYSTEM KIT) w/Device KIT Test three times daily. Dx Type 2 Diabetes.   . gabapentin (NEURONTIN) 300 MG capsule Take 1 capsule by mouth twice daily 08/29/2019: Pt said that she is taking it once a day   . insulin glargine (LANTUS) 100 unit/mL SOPN Inject 0.45 mLs (45 Units total) into the skin daily. 05/30/2019: Hasn't filled.  Elmore Guise Devices (ACCU-CHEK SOFTCLIX) lancets Use as instructed   . nystatin (NYSTATIN) powder Apply topically 3 (three) times daily.   Marland Kitchen nystatin ointment (MYCOSTATIN) APPLY 1 APPLICATION OF  OINTMENT TOPICALLY TWICE DAILY   . omeprazole (PRILOSEC) 20 MG capsule Take 1 capsule by mouth once daily   . ONE TOUCH LANCETS MISC Test 3 times daily with glucometer. Dx Type 2 diabetes with unspecified complications.   . RELION PEN NEEDLE 31G/8MM 31G X 8 MM MISC USE AS DIRECTED 4 TIMES DAILY   . TRESIBA FLEXTOUCH 200 UNIT/ML FlexTouch Pen Inject 44 Units into the skin daily.   . [DISCONTINUED] nystatin ointment (MYCOSTATIN) APPLY 1 APPLICATION OF  OINTMENT TOPICALLY TWICE DAILY (Patient taking differently: Apply 1 application topically 2 (two) times daily as needed (rash). APPLY 1 APPLICATION OF  OINTMENT TOPICALLY TWICE DAILY PRN)   . [DISCONTINUED] NYSTATIN powder APPLY TOPICALLY 4 TIMES A DAY   . acetaminophen-codeine (TYLENOL #3) 300-30 MG tablet TAKE 1 TABLET BY MOUTH EVERY 6 HOURS AS NEEDED FOR MODERATE PAIN (Patient not taking: Reported on 08/29/2019)   . meloxicam (  MOBIC) 15 MG tablet Take 1 tablet (15 mg total) by mouth daily. X 2 wks (Patient not taking: Reported on 08/29/2019)   . Triamcinolone Acetonide (TRIAMCINOLONE 0.1 % CREAM : EUCERIN) CREA Apply 1 application topically 3 (three) times daily as needed. (Patient not taking: Reported on 08/29/2019)    No  facility-administered encounter medications on file as of 08/29/2019.    Goals Addressed   None      Follow up Plan: 1. Phone call 09/01/19 to complete food stamps application   Estanislado Emms, [facility] [phone]

## 2019-08-29 NOTE — Telephone Encounter (Signed)
Called and spoke with [facility] I was able to get an appt., for Rachel Gonzalez on 09/08/19 @1 :15pm. Gave the patient this information.  The person I spoke from their office  stated that Rachel Gonzalez was contacted on 07/20 to discuss her appt she didn't return their call.She  also missed an appt in 12/20 to follow up for her fitting mold. She also mention that also Dr.J can sign the order and 1/21 Co sign the order that she will not need to see a MD,OD. That rule for medicaid and medicare has changed. Once she come in for her appt on the 09/08/19 their office will fax the order to have Dr.J and 11/09/19 NP to sign.

## 2019-08-29 NOTE — Progress Notes (Signed)
[facility]   Subjective:  Patient ID: ZHARIA CONROW, female    DOB: 1950-07-18  Age: 69 y.o. MRN: [id number]    HPI Alizon Schmeling is a 69 year old female with a medical history significant for uncontrolled type 2 diabetes mellitus, essential hypertension, multiple left toe amputations, chronic kidney disease, and obesity presents for follow-up of chronic condition.  Also, patient inquiring about diabetic shoes.  Patient states that she has been doing well and is without complaints.  She is not always compliant with insulin.  Also, patient does not check blood glucose levels consistently.  She denies any increased urination, increased hunger or thirst.  She also denies blurred vision or dizziness.  Patient was fitted for diabetic shoes by podiatry 1 year ago.  She has been lost to follow-up.  She states that she missed December appointment due to COVID-19 restrictions.  She was a patient of Dr. Milinda Pointer, podiatrist.  Ms. Tourangeau does not check blood pressure at home.  She denies chest pain, heart palpitations, orthopnea, or shortness of breath.  Patient endorses bilateral lower extremity edema.  She states it has been difficult to apply shoes.      Dr. Milinda Pointer Past Medical History:  Diagnosis Date  . Anemia   . Arthritis    right finger  . Diabetes mellitus   . GERD (gastroesophageal reflux disease)   . Hypertension   . Renal disorder     Past Surgical History:  Procedure Laterality Date  . AMPUTATION Right 05/08/2014   Procedure: AMPUTATION RAY, right great toe;  Surgeon: Newt Minion, MD;  Location: [facility];  Service: Orthopedics;  Laterality: Right;  . arm surgery     . CESAREAN SECTION    . COLONOSCOPY    . I & D EXTREMITY Right 05/08/2014   Procedure: IRRIGATION AND DEBRIDEMENT FOOT;  Surgeon: Newt Minion, MD;  Location: [facility];  Service: Orthopedics;  Laterality: Right;  . MEMBRANE PEEL Right 03/15/2018   Procedure: MEMBRANE PEEL;   Surgeon: Jalene Mullet, MD;  Location: [facility];  Service: Ophthalmology;  Laterality: Right;  . PHOTOCOAGULATION WITH LASER Right 03/15/2018   Procedure: PHOTOCOAGULATION WITH LASER;  Surgeon: Jalene Mullet, MD;  Location: [facility];  Service: Ophthalmology;  Laterality: Right;  . 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 Diabetes Mother     Social History   Socioeconomic History  . Marital status: Married    Spouse name: Not on file  . Number of children: Not on file  . Years of education: Not on file  . Highest education level: Not on file  Occupational History  . Not on file  Tobacco Use  . Smoking status: Never Smoker  . Smokeless tobacco: Never Used  Vaping Use  . Vaping Use: Never used  Substance and Sexual Activity  . Alcohol use: No  . Drug use: No  . Sexual activity: Not on file  Other Topics Concern  . Not on file  Social History Narrative   From [place].   Married.   Three children, 9 grandchildren.   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   Travels to [place], [place]  Determinants of Health   Financial Resource Strain:   . Difficulty of Paying Living Expenses:   Food Insecurity:   . Worried About Charity fundraiser in the Last Year:   . Arboriculturist in the Last Year:   Transportation Needs:   . Film/video editor (Medical):   Marland Kitchen Lack of Transportation (Non-Medical):   Physical Activity:   . Days of Exercise per Week:   . Minutes of Exercise per Session:   Stress:   . Feeling of Stress :   Social Connections:   . Frequency of Communication with Friends and Family:   . Frequency of Social Gatherings with Friends  and Family:   . Attends Religious Services:   . Active Member of Clubs or Organizations:   . Attends Archivist Meetings:   Marland Kitchen Marital Status:   Intimate Partner Violence:   . Fear of Current or Ex-Partner:   . Emotionally Abused:   Marland Kitchen Physically Abused:   . Sexually Abused:     Outpatient Medications Prior to Visit  Medication Sig Dispense Refill  . gabapentin (NEURONTIN) 300 MG capsule Take 1 capsule by mouth twice daily 180 capsule 0  . NYSTATIN powder APPLY TOPICALLY 4 TIMES A DAY 15 g 0  . acetaminophen (TYLENOL) 500 MG tablet Take 500-1,000 mg by mouth every 6 (six) hours as needed (pain.).    Marland Kitchen acetaminophen-codeine (TYLENOL #3) 300-30 MG tablet TAKE 1 TABLET BY MOUTH EVERY 6 HOURS AS NEEDED FOR MODERATE PAIN 30 tablet 0  . amLODipine (NORVASC) 10 MG tablet Take 1 tablet (10 mg total) by mouth daily. 90 tablet 3  . atorvastatin (LIPITOR) 10 MG tablet Take 1 tablet (10 mg total) by mouth daily. 90 tablet 3  . Blood Glucose Monitoring Suppl (ACCU-CHEK AVIVA PLUS) w/Device KIT 1 Device by Does not apply route once. Use as directed to check blood sugar 1 kit 0  . Blood Glucose Monitoring Suppl (ONE TOUCH ULTRA SYSTEM KIT) w/Device KIT Test three times daily. Dx Type 2 Diabetes. 1 each 0  . insulin glargine (LANTUS) 100 unit/mL SOPN Inject 0.45 mLs (45 Units total) into the skin daily. 15 mL 11  . Lancet Devices (ACCU-CHEK SOFTCLIX) lancets Use as instructed 100 each 12  . meloxicam (MOBIC) 15 MG tablet Take 1 tablet (15 mg total) by mouth daily. X 2 wks 30 tablet 0  . nystatin ointment (MYCOSTATIN) APPLY 1 APPLICATION OF  OINTMENT TOPICALLY TWICE DAILY (Patient taking differently: Apply 1 application topically 2 (two) times daily as needed (rash). APPLY 1 APPLICATION OF  OINTMENT TOPICALLY TWICE DAILY PRN) 30 g 0  . omeprazole (PRILOSEC) 20 MG capsule Take 1 capsule by mouth once daily 30 capsule 0  . ONE TOUCH LANCETS MISC Test 3 times daily with glucometer. Dx Type 2 diabetes  with unspecified complications. 200 each 5  . RELION PEN NEEDLE 31G/8MM 31G X 8 MM MISC USE AS DIRECTED 4 TIMES DAILY 100 each 11  . TRESIBA FLEXTOUCH 200 UNIT/ML FlexTouch Pen Inject 44 Units into the skin daily. 5 pen 0  . Triamcinolone Acetonide (TRIAMCINOLONE 0.1 % CREAM : EUCERIN) CREA Apply 1 application topically 3 (three) times daily as needed. (Patient taking differently: Apply 1 application topically 3 (three) times daily as needed for rash or itching. ) 1 each 5   No facility-administered medications prior to visit.    Allergies  Allergen Reactions  . Tramadol Nausea And Vomiting  . Adhesive [Tape] Itching  . Bactrim [  Sulfamethoxazole-Trimethoprim] Other (See Comments)    AKI and hyperkalemia   . Tylenol With Codeine #3 [Acetaminophen-Codeine] Diarrhea and Itching    ROS Review of Systems  Constitutional: Negative for fatigue.  HENT: Negative.   Respiratory: Negative.   Cardiovascular: Positive for leg swelling. Negative for chest pain and palpitations.  Gastrointestinal: Negative.   Endocrine: Negative for polydipsia, polyphagia and polyuria.  Genitourinary: Negative.   Musculoskeletal: Negative.   Allergic/Immunologic: Negative.   Neurological: Negative.   Hematological: Negative.   Psychiatric/Behavioral: Positive for dysphoric mood.      Objective:    Physical Exam Constitutional:      Appearance: Normal appearance.  HENT:     Head: Normocephalic.     Nose: Nose normal.  Eyes:     Pupils: Pupils are equal, round, and reactive to light.  Cardiovascular:     Rate and Rhythm: Normal rate and regular rhythm.     Pulses: Normal pulses.  Pulmonary:     Effort: Pulmonary effort is normal.  Abdominal:     General: Abdomen is flat. Bowel sounds are normal.  Musculoskeletal:        General: Normal range of motion.     Right lower leg: 2+ Pitting Edema present.     Left lower leg: 2+ Pitting Edema present.  Skin:    General: Skin is warm.  Neurological:      General: No focal deficit present.     Mental Status: She is alert. Mental status is at baseline.  Psychiatric:        Mood and Affect: Mood normal.        Behavior: Behavior normal.        Thought Content: Thought content normal.        Judgment: Judgment normal.     There were no vitals taken for this visit. Wt Readings from Last 3 Encounters:  05/30/19 220 lb 0.3 oz (99.8 kg)  05/23/19 222 lb (100.7 kg)  11/23/18 225 lb (102.1 kg)     Health Maintenance Due  Topic Date Due  . COLONOSCOPY  Never done  . DEXA SCAN  Never done  . MAMMOGRAM  04/22/2017  . HEMOGLOBIN A1C  08/23/2019    There are no preventive care reminders to display for this patient.  Lab Results  Component Value Date   TSH 2.025 02/12/2015   Lab Results  Component Value Date   WBC 6.0 06/26/2019   HGB 10.0 (L) 06/26/2019   HCT 32.7 (L) 06/26/2019   MCV 84 06/26/2019   PLT 268 06/26/2019   Lab Results  Component Value Date   NA 140 06/26/2019   K 4.7 06/26/2019   CO2 22 06/26/2019   GLUCOSE 215 (H) 06/26/2019   BUN 24 06/26/2019   CREATININE 1.72 (H) 06/26/2019   BILITOT <0.2 06/26/2019   ALKPHOS 186 (H) 06/26/2019   AST 9 06/26/2019   ALT 6 06/26/2019   PROT 6.2 06/26/2019   ALBUMIN 3.7 (L) 06/26/2019   CALCIUM 8.8 06/26/2019   ANIONGAP 9 06/01/2019   GFR 63.19 11/07/2014   Lab Results  Component Value Date   CHOL 156 06/26/2019   Lab Results  Component Value Date   HDL 78 06/26/2019   Lab Results  Component Value Date   LDLCALC 68 06/26/2019   Lab Results  Component Value Date   TRIG 43 06/26/2019   Lab Results  Component Value Date   CHOLHDL 2.0 06/26/2019   Lab Results  Component Value Date  HGBA1C 10.2 (A) 05/23/2019      Assessment & Plan:   Problem List Items Addressed This Visit      Cardiovascular and Mediastinum   Essential hypertension - Primary     Endocrine   Uncontrolled type 2 diabetes mellitus with hyperglycemia (HCC)     Other   Obesity  (BMI 30-39.9) (Chronic)      Essential hypertension BP (!) 162/64 (BP Location: Left Arm, Patient Position: Sitting, Cuff Size: Large)   Pulse 74   Temp (!) 97.3 F (36.3 C) (Temporal)   Ht 5' 4"  (1.626 m)   Wt 225 lb 6.4 oz (102.2 kg)   SpO2 100%   BMI 38.69 kg/m  Blood pressure is above goal on today.  Patient did not take blood pressure medications prior to arrival.  Patient endorses some bilateral lower extremity edema.  We will start a trial of furosemide 20 mg daily.  Patient will return in 1 week to check potassium and creatinine level. - CMP and Liver - POCT Urinalysis Dipstick  Uncontrolled type 2 diabetes mellitus with hyperglycemia (HCC) Today, hemoglobin A1c is 10.5.  Discussed the importance of following medication regimen consistently in order to achieve positive outcomes.  Also, recommend a carb modified diet.  Patient has gone to diabetes and nutrition in the past and has not found it helpful.  She typically prepares her own meals.  Continue Lantus 45 units at bedtime.  No increase in medication warranted, patient advised to take medication consistently. - CMP and Liver - HgB A1c  Obesity (BMI 30-39.9) The patient is asked to make an attempt to improve diet and exercise patterns to aid in medical management of this problem.  Stage 3a chronic kidney disease Follow-up with nephrology as scheduled.  Urine white blood cells increased - Urine Culture  Intertrigo - nystatin ointment (MYCOSTATIN); APPLY 1 APPLICATION OF  OINTMENT TOPICALLY TWICE DAILY  Dispense: 30 g; Refill: 3 - nystatin (NYSTATIN) powder; Apply topically 3 (three) times daily.  Dispense: 15 g; Refill: 3  Stress at home: Patient referred to LCSW to address some stressors at home.  Follow-up: Return in about 3 months (around 11/29/2019).  Return for lab appointment in 1 week.  This will be a nurse visit.  Remind her to report to the nearest emergency department if any chest pain, shortness of breath,  nausea, dizziness, severe headache, changes in vision or speech, left arm numbness and tingling occurs.  Also, if jaw pain occurs after starting trial of furosemide.     Donia Pounds  APRN, MSN, FNP-C [facility] [address] [place], [place] [address] [phone]

## 2019-08-30 LAB — CMP AND LIVER
ALT: 8 IU/L (ref 0–32)
AST: 9 IU/L (ref 0–40)
Albumin: 3.9 g/dL (ref 3.8–4.8)
Alkaline Phosphatase: 190 IU/L — ABNORMAL HIGH (ref 48–121)
BUN: 25 mg/dL (ref 8–27)
Bilirubin Total: 0.3 mg/dL (ref 0.0–1.2)
Bilirubin, Direct: 0.09 mg/dL (ref 0.00–0.40)
CO2: 23 mmol/L (ref 20–29)
Calcium: 9.4 mg/dL (ref 8.7–10.3)
Chloride: 106 mmol/L (ref 96–106)
Creatinine, Ser: 1.61 mg/dL — ABNORMAL HIGH (ref 0.57–1.00)
GFR calc Af Amer: 38 mL/min/{1.73_m2} — ABNORMAL LOW (ref >59)
GFR calc non Af Amer: 33 mL/min/{1.73_m2} — ABNORMAL LOW (ref >59)
Glucose: 62 mg/dL — ABNORMAL LOW (ref 65–99)
Potassium: 4.5 mmol/L (ref 3.5–5.2)
Sodium: 140 mmol/L (ref 134–144)
Total Protein: 6.9 g/dL (ref 6.0–8.5)

## 2019-08-31 LAB — URINE CULTURE: Organism ID, Bacteria: NO GROWTH

## 2019-09-01 ENCOUNTER — Telehealth: Payer: Self-pay | Admitting: Family Medicine

## 2019-09-01 ENCOUNTER — Other Ambulatory Visit: Payer: Self-pay | Admitting: Family Medicine

## 2019-09-01 DIAGNOSIS — R600 Localized edema: Secondary | ICD-10-CM

## 2019-09-01 DIAGNOSIS — I10 Essential (primary) hypertension: Secondary | ICD-10-CM

## 2019-09-01 MED ORDER — FUROSEMIDE 20 MG PO TABS: 20.0000 mg | ORAL_TABLET | Freq: Every day | ORAL | 11 refills | Status: DC

## 2019-09-01 NOTE — Telephone Encounter (Signed)
Pt called stating that the foot cream and other medicine was not sent to pharmacy. Please follow up with pt.

## 2019-09-01 NOTE — Progress Notes (Signed)
Meds ordered this encounter  Medications  . furosemide (LASIX) 20 MG tablet    Sig: Take 1 tablet (20 mg total) by mouth daily.    Dispense:  30 tablet    Refill:  11    Order Specific Question:   Supervising Provider    Answer:   Quentin Angst [[id number]]   Orders Placed This Encounter  Procedures  . Basic metabolic panel    Standing Status:   Future    Standing Expiration Date:   08/31/2020  Nolon Nations  APRN, MSN, FNP-C Patient Care [facility] [address] [place], [name] [address] [phone]

## 2019-09-01 NOTE — Telephone Encounter (Signed)
Spoke with patient she wanted to know if she needs a rx for Lasix for swelling, I didn't see what were this was order for patient. POA was done for cream that was order for her. Please advise.

## 2019-09-04 ENCOUNTER — Ambulatory Visit: Payer: Medicare HMO | Admitting: Orthopedic Surgery

## 2019-09-05 ENCOUNTER — Telehealth: Payer: Self-pay | Admitting: Clinical

## 2019-09-05 ENCOUNTER — Telehealth: Payer: Self-pay

## 2019-09-05 ENCOUNTER — Telehealth: Payer: Self-pay | Admitting: Family Medicine

## 2019-09-05 NOTE — Telephone Encounter (Signed)
-----   Message from Massie Maroon, [place] sent at 09/01/2019  3:20 PM EDT ----- Furosemide 20 mg was sent to pharmacy for essential hypertension and lower extremity edema. She will need to return to clinic in 1 week for labs and blood pressure check. Monitor blood pressure at home. Continue DASH diet. Reminder to go to the ER if any CP, SOB, nausea, dizziness, severe HA, changes vision/speech, left arm numbness and tingling and jaw pain.   Nolon Nations  APRN, MSN, FNP-C Patient Care [facility] [address] [place], [name] [address] [phone]

## 2019-09-05 NOTE — Telephone Encounter (Signed)
Pt called back returning missed call

## 2019-09-05 NOTE — Telephone Encounter (Signed)
Called and left detail voicemail on patient voicemail,  She needs make a 1 week lab appt for blood check and labs, she also needs to check her b/p at home. Start lasix 20mg  for Edema.

## 2019-09-05 NOTE — Telephone Encounter (Signed)
[facility] General Follow Up Note  09/05/2019 Name: ARLYS SCATENA MRN: [id number] DOB: 27-Aug-1950 Rachel Gonzalez is a 69 y.o. year old female who sees Massie Maroon, FNP for primary care. LCSW was initially consulted to assess patient's needs and assist the patient with Mental Health Counseling and Resources.  Interpreter: No.   Interpreter Name & Language: none  Ongoing Intervention: Patient experiencing Financial constraints related to low income. PCP also referred for mental health and illness adjustment issues, though in visit last week patient declined to discuss concerns. PHQ9 administered by staff was 0. Today CSW called patient to follow up on assisting with food stamps application. Patient indicated she no longer wanted to do the application.  Review of patient status, including review of consultants reports, relevant laboratory and other test results, and collaboration with appropriate care team members and the patient's provider was performed as part of comprehensive patient evaluation and provision of services.     Follow up Plan: 1. CSW available from clinic as needed   Abigail Butts, LCSW [facility] [phone]

## 2019-09-05 NOTE — Telephone Encounter (Signed)
Spoke with pt she ,made an appt for labs, and b/p check for 1 week.

## 2019-09-08 ENCOUNTER — Encounter: Payer: Self-pay | Admitting: Podiatry

## 2019-09-08 ENCOUNTER — Ambulatory Visit (INDEPENDENT_AMBULATORY_CARE_PROVIDER_SITE_OTHER): Payer: Medicare HMO | Admitting: Podiatry

## 2019-09-08 ENCOUNTER — Other Ambulatory Visit: Payer: Self-pay

## 2019-09-08 VITALS — Temp 95.1°F

## 2019-09-08 DIAGNOSIS — E1142 Type 2 diabetes mellitus with diabetic polyneuropathy: Secondary | ICD-10-CM | POA: Diagnosis not present

## 2019-09-08 DIAGNOSIS — M79676 Pain in unspecified toe(s): Secondary | ICD-10-CM

## 2019-09-08 DIAGNOSIS — B351 Tinea unguium: Secondary | ICD-10-CM

## 2019-09-08 DIAGNOSIS — Z89411 Acquired absence of right great toe: Secondary | ICD-10-CM

## 2019-09-08 DIAGNOSIS — L84 Corns and callosities: Secondary | ICD-10-CM | POA: Diagnosis not present

## 2019-09-10 NOTE — Progress Notes (Addendum)
Subjective: Rachel Gonzalez is a pleasant 69 y.o. female patient seen today at risk foot care. 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 Pt has h/o NIDDM with chronic kidney disease and painful thick toenails that are difficult to trim. Pain interferes with ambulation. Aggravating factors include wearing enclosed shoe gear. Pain is relieved with periodic professional debridement.   Patient states she feels off balance without insert for her right foot.  Past Medical History:  Diagnosis Date  . Anemia   . Arthritis    right finger  . Diabetes mellitus   . GERD (gastroesophageal reflux disease)   . Hypertension   . Renal disorder     Patient Active Problem List   Diagnosis Date Noted  . Acute kidney injury superimposed on chronic kidney disease ([place]) 05/30/2019  . Stage 3 chronic kidney disease 12/01/2016  . Frequent falls 04/28/2016  . Right arm weakness 04/28/2016  . Bilateral lower extremity edema 04/28/2016  . Screening for colon cancer 04/28/2016  . Abnormal urinalysis 09/13/2014  . Chest pain 09/13/2014  . Abdominal pain, recurrent 09/13/2014  . Dehydration with hyponatremia 09/13/2014  . Acute hyperkalemia 09/13/2014  . Acute renal failure superimposed on stage 3 chronic kidney disease ([place]) 09/13/2014  . Anemia 05/06/2014  . Diabetic neuropathy, type II diabetes mellitus ([place]) 05/06/2014  . Obesity (BMI 30-39.9) 05/06/2014  . Cellulitis of right foot 05/06/2014  . Essential hypertension 03/07/2011  . 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 Right arm pain 08/04/2006  . Osteoarthritis 02/14/2006  . Uncontrolled type 2 diabetes mellitus with hyperglycemia ([place]) 01/11/2006    Current Outpatient Medications on File Prior to Visit  Medication Sig Dispense Refill  . acetaminophen (TYLENOL) 500 MG tablet Take 500-1,000 mg by mouth every 6 (six) hours as needed (pain.).    Marland Kitchen acetaminophen-codeine (TYLENOL #3) 300-30  MG tablet TAKE 1 TABLET BY MOUTH EVERY 6 HOURS AS NEEDED FOR MODERATE PAIN 30 tablet 0  . amLODipine (NORVASC) 10 MG tablet Take 1 tablet (10 mg total) by mouth daily. 90 tablet 3  . atorvastatin (LIPITOR) 10 MG tablet Take 1 tablet (10 mg total) by mouth daily. 90 tablet 3  . Blood Glucose Monitoring Suppl (ACCU-CHEK AVIVA PLUS) w/Device KIT 1 Device by Does not apply route once. Use as directed to check blood sugar 1 kit 0  . Blood Glucose Monitoring Suppl (ONE TOUCH ULTRA SYSTEM KIT) w/Device KIT Test three times daily. Dx Type 2 Diabetes. 1 each 0  . furosemide (LASIX) 20 MG tablet Take 1 tablet (20 mg total) by mouth daily. 30 tablet 11  . gabapentin (NEURONTIN) 300 MG capsule Take 1 capsule by mouth twice daily 180 capsule 0  . insulin glargine (LANTUS) 100 unit/mL SOPN Inject 0.45 mLs (45 Units total) into the skin daily. 15 mL 11  . Lancet Devices (ACCU-CHEK SOFTCLIX) lancets Use as instructed 100 each 12  . meloxicam (MOBIC) 15 MG tablet Take 1 tablet (15 mg total) by mouth daily. X 2 wks 30 tablet 0  . nystatin (NYSTATIN) powder Apply topically 3 (three) times daily. 15 g 3  . nystatin ointment (MYCOSTATIN) APPLY 1 APPLICATION OF  OINTMENT TOPICALLY TWICE DAILY 30 g 3  . omeprazole (PRILOSEC) 20 MG capsule Take 1 capsule by mouth once daily 30 capsule 0  . ONE TOUCH LANCETS MISC Test 3 times daily with glucometer. Dx Type 2 diabetes with unspecified complications. 200 each 5  . RELION  PEN NEEDLE 31G/8MM 31G X 8 MM MISC USE AS DIRECTED 4 TIMES DAILY 100 each 11  . TRESIBA FLEXTOUCH 200 UNIT/ML FlexTouch Pen Inject 44 Units into the skin daily. 5 pen 0  . Triamcinolone Acetonide (TRIAMCINOLONE 0.1 % CREAM : EUCERIN) CREA Apply 1 application topically 3 (three) times daily as needed. 1 each 5   No current facility-administered medications on file prior to visit.    Allergies  Allergen Reactions  . Tramadol Nausea And Vomiting  . Adhesive [Tape] Itching  . Bactrim  [Sulfamethoxazole-Trimethoprim] Other (See Comments)    AKI and hyperkalemia   . Tylenol With Codeine #3 [Acetaminophen-Codeine] Diarrhea and Itching    Objective: Physical Exam  General: Rachel Gonzalez is a pleasant 69 y.o. African American female, in NAD. AAO x 3.   Vascular:  Neurovascular status unchanged b/l lower extremities. Capillary refill time to digits immediate b/l. Palpable pedal pulses b/l LE. Pedal hair present. Lower extremity skin temperature gradient within normal limits. No pain with calf compression b/l.  Dermatological:  Pedal skin with normal turgor, texture and tone bilaterally. No open wounds bilaterally. No interdigital macerations bilaterally. Toenails 1-5 left, R 2nd toe, R 3rd toe, R 4th toe and R 5th toe elongated, discolored, dystrophic, thickened, and crumbly with subungual debris and tenderness to dorsal palpation.   Hyperkeratotic lesion submet head 5 left foot with tenderness to palpation. No edema, no erythema, no drainage, no flocculence.  Musculoskeletal:  Normal muscle strength 5/5 to all lower extremity muscle groups bilaterally. No pain crepitus or joint limitation noted with ROM b/l. 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  Neurological:  Protective sensation diminished with 10g monofilament b/l. Vibratory sensation intact b/l. Proprioception intact bilaterally.  Assessment and Plan:  1. Pain due to onychomycosis of toenail   2. Callus   3. 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 Diabetic polyneuropathy associated with type 2 diabetes mellitus ([place])    -Examined patient. -Continue diabetic foot care principles. -Toenails b/l lower extremities, 1-5 left, R 2nd toe, R 3rd toe, R 4th toe and R 5th toe debrided in length and girth without iatrogenic bleeding with sterile nail nipper and dremel.  -She will need filler for right shoe. -Patient to continue soft, supportive shoe gear daily.  Start procedure for diabetic shoes. Patient qualifies based on diagnoses. -Patient/POA to call should there be question/concern in the interim.  Return in about 3 months (around 12/09/2019).  Marzetta Board, DPM

## 2019-09-13 ENCOUNTER — Ambulatory Visit: Payer: Medicare HMO | Admitting: Family Medicine

## 2019-09-13 ENCOUNTER — Other Ambulatory Visit: Payer: Self-pay

## 2019-09-13 DIAGNOSIS — I10 Essential (primary) hypertension: Secondary | ICD-10-CM | POA: Diagnosis not present

## 2019-09-13 DIAGNOSIS — R600 Localized edema: Secondary | ICD-10-CM | POA: Diagnosis not present

## 2019-09-13 NOTE — Progress Notes (Signed)
Patient in for Blood Pressure Check.   Per verbal by Raliegh Ip, NP patient to sit for a few minutes and then will recheck.   Blood pressure rechecked. Patient advised to keep follow up appointment.

## 2019-09-14 LAB — BASIC METABOLIC PANEL
BUN/Creatinine Ratio: 14 (ref 12–28)
BUN: 24 mg/dL (ref 8–27)
CO2: 25 mmol/L (ref 20–29)
Calcium: 8.7 mg/dL (ref 8.7–10.3)
Chloride: 105 mmol/L (ref 96–106)
Creatinine, Ser: 1.74 mg/dL — ABNORMAL HIGH (ref 0.57–1.00)
GFR calc Af Amer: 34 mL/min/{1.73_m2} — ABNORMAL LOW (ref >59)
GFR calc non Af Amer: 30 mL/min/{1.73_m2} — ABNORMAL LOW (ref >59)
Glucose: 319 mg/dL — ABNORMAL HIGH (ref 65–99)
Potassium: 5.2 mmol/L (ref 3.5–5.2)
Sodium: 140 mmol/L (ref 134–144)

## 2019-09-21 ENCOUNTER — Telehealth: Payer: Self-pay | Admitting: Family Medicine

## 2019-09-21 NOTE — Telephone Encounter (Signed)
-----   Message from Massie Maroon, [place] sent at 09/20/2019 11:24 AM EDT ----- Regarding: lab results Please inform patient that creatinine, indicator of kidney functioning continues to be slightly elevated but is consistent with her baseline. No medication changes are warranted. Also, electrolytes are within a normal range, she can continue lasix for bilateral lower extremity swelling. Will follow up in office in 1 month. It is also very important that she follows a low carbohydrate diet and take medications consistently in order to achieve positive outcomes.   Nolon Nations  APRN, MSN, FNP-C Patient Care [facility] [address] [place], [name] [address] [phone]

## 2019-09-21 NOTE — Telephone Encounter (Signed)
Patient notified of results, verbally understood. No additional questions. Appointment was scheduled for 8/10.

## 2019-09-27 ENCOUNTER — Other Ambulatory Visit: Payer: Self-pay

## 2019-09-27 ENCOUNTER — Ambulatory Visit: Payer: Medicare HMO | Admitting: Orthotics

## 2019-10-17 ENCOUNTER — Ambulatory Visit: Payer: Medicare HMO | Admitting: Family Medicine

## 2019-11-02 ENCOUNTER — Other Ambulatory Visit: Payer: Self-pay | Admitting: Family Medicine

## 2019-11-02 DIAGNOSIS — E1165 Type 2 diabetes mellitus with hyperglycemia: Secondary | ICD-10-CM

## 2019-11-06 ENCOUNTER — Other Ambulatory Visit: Payer: Self-pay

## 2019-11-06 ENCOUNTER — Encounter (HOSPITAL_COMMUNITY): Payer: Self-pay | Admitting: Emergency Medicine

## 2019-11-06 ENCOUNTER — Ambulatory Visit (HOSPITAL_COMMUNITY)
Admission: EM | Admit: 2019-11-06 | Discharge: 2019-11-06 | Disposition: A | Discharge status: Home / Self Care | Payer: Medicare HMO | Attending: Family Medicine | Admitting: Family Medicine

## 2019-11-06 DIAGNOSIS — Z89411 Acquired absence of right great toe: Secondary | ICD-10-CM | POA: Diagnosis not present

## 2019-11-06 DIAGNOSIS — T7840XA Allergy, unspecified, initial encounter: Secondary | ICD-10-CM | POA: Diagnosis not present

## 2019-11-06 MED ORDER — PREDNISONE 20 MG PO TABS: 20.0000 mg | ORAL_TABLET | Freq: Two times a day (BID) | ORAL | 0 refills | Status: DC

## 2019-11-06 MED ORDER — HYDROXYZINE HCL 25 MG PO TABS: 25.0000 mg | ORAL_TABLET | Freq: Four times a day (QID) | ORAL | 0 refills | Status: DC | PRN

## 2019-11-06 NOTE — Discharge Instructions (Addendum)
Use hydroxyzine for itching instead of Benadryl. Take prednisone 2 times a day for 5 days.  May stop early if the swelling and pain go down Watch sugar carefully while you are on prednisone.  Make sure you follow a diabetic diet

## 2019-11-06 NOTE — ED Provider Notes (Signed)
[name]    CSN: [id number] Arrival date & time: 11/06/19  1408      History   Chief Complaint Chief Complaint  Patient presents with  . Rash    HPI Rachel Gonzalez is a 69 y.o. female.   HPI   Patient was outdoors on Sunday.  She has a couple of insect bites.  One is on the right side of her face and the other on the back of her right arm.  She states they are swollen and that they hurt.  In addition there causing her to itch all over her body.  She states the itching is making her quite agitated.  She could not sleep last night.  She has tried taking Benadryl.  This is not helped. She is an insulin-dependent diabetes.  Poorly controlled with most recent hemoglobin A1c over 10. We discussed that these allergic reactions are best treated with antihistamines, but also steroid medicines help.  Steroid medications on an insulin-dependent diabetic can raise her sugars.  She is advised to keep very close tabs of her sugar, watch her diet and carbohydrates, and stop the prednisone if her blood sugar is elevated Has not had prior allergic reactions Is not having any throat swelling, voice changes, or shortness of breath Past Medical History:  Diagnosis Date  . Anemia   . Arthritis    right finger  . Diabetes mellitus   . GERD (gastroesophageal reflux disease)   . Hypertension   . Renal disorder     Patient Active Problem List   Diagnosis Date Noted  . Acute kidney injury superimposed on chronic kidney disease ([place]) 05/30/2019  . Stage 3 chronic kidney disease 12/01/2016  . Frequent falls 04/28/2016  . Right arm weakness 04/28/2016  . Bilateral lower extremity edema 04/28/2016  . Screening for colon cancer 04/28/2016  . Abnormal urinalysis 09/13/2014  . Chest pain 09/13/2014  . Abdominal pain, recurrent 09/13/2014  . Dehydration with hyponatremia 09/13/2014  . Acute hyperkalemia 09/13/2014  . Acute renal failure superimposed on stage 3 chronic kidney disease  ([place]) 09/13/2014  . Anemia 05/06/2014  . Diabetic neuropathy, type II diabetes mellitus ([place]) 05/06/2014  . Obesity (BMI 30-39.9) 05/06/2014  . Cellulitis of right foot 05/06/2014  . Essential hypertension 03/07/2011  . 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 Right arm pain 08/04/2006  . Osteoarthritis 02/14/2006  . Uncontrolled type 2 diabetes mellitus with hyperglycemia ([place]) 01/11/2006    Past Surgical History:  Procedure Laterality Date  . AMPUTATION Right 05/08/2014   Procedure: AMPUTATION RAY, right great toe;  Surgeon: Newt Minion, MD;  Location: [place];  Service: Orthopedics;  Laterality: Right;  . arm surgery     . CESAREAN SECTION    . COLONOSCOPY    . I & D EXTREMITY Right 05/08/2014   Procedure: IRRIGATION AND DEBRIDEMENT FOOT;  Surgeon: Newt Minion, MD;  Location: [place];  Service: Orthopedics;  Laterality: Right;  . MEMBRANE PEEL Right 03/15/2018   Procedure: MEMBRANE PEEL;  Surgeon: Jalene Mullet, MD;  Location: [place];  Service: Ophthalmology;  Laterality: Right;  . PHOTOCOAGULATION WITH LASER Right 03/15/2018   Procedure: PHOTOCOAGULATION WITH LASER;  Surgeon: Jalene Mullet, MD;  Location: [place];  Service: Ophthalmology;  Laterality: Right;  . 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 Authorizing Provider  acetaminophen (TYLENOL) 500 MG tablet Take 500-1,000 mg by mouth every 6 (six) hours as needed (pain.).   Yes [provider]    acetaminophen-codeine (TYLENOL #3) 300-30 MG tablet TAKE 1 TABLET BY MOUTH EVERY 6 HOURS AS NEEDED FOR MODERATE PAIN 05/23/19  Yes Dorena Dew, FNP  amLODipine (NORVASC) 10 MG tablet Take 1 tablet (10 mg total) by mouth daily. 05/23/19  Yes Dorena Dew, FNP  atorvastatin (LIPITOR) 10 MG tablet Take 1 tablet (10 mg total) by mouth daily. 11/09/18  Yes Lanae Boast, FNP  Blood Glucose Monitoring Suppl (ACCU-CHEK AVIVA PLUS) w/Device KIT 1 Device by Does not apply route once. Use as directed to check blood sugar 06/11/15  Yes Dorena Dew, FNP  Blood Glucose Monitoring Suppl (ONE TOUCH ULTRA SYSTEM KIT) w/Device KIT Test three times daily. Dx Type 2 Diabetes. 06/10/15  Yes Dorena Dew, FNP  furosemide (LASIX) 20 MG tablet Take 1 tablet (20 mg total) by mouth daily. 09/01/19 08/31/20 Yes Dorena Dew, FNP  gabapentin (NEURONTIN) 300 MG capsule Take 1 capsule by mouth twice daily 08/15/19  Yes Dorena Dew, FNP  insulin glargine (LANTUS) 100 unit/mL SOPN Inject 0.45 mLs (45 Units total) into the skin daily. 05/23/19  Yes Dorena Dew, FNP  Lancet Devices [facility]) lancets Use as instructed 06/11/15  Yes Dorena Dew, FNP  meloxicam (MOBIC) 15 MG tablet Take 1 tablet (15 mg total) by mouth daily. X 2 wks 06/05/19  Yes Meredith Pel, MD  nystatin (NYSTATIN) powder Apply topically 3 (three) times daily. 08/29/19  Yes Dorena Dew, FNP  nystatin ointment (MYCOSTATIN) APPLY 1 APPLICATION OF  OINTMENT TOPICALLY TWICE DAILY 08/29/19  Yes Dorena Dew, FNP  omeprazole (PRILOSEC) 20 MG capsule Take 1 capsule by mouth once daily 05/26/19  Yes Dorena Dew, FNP  ONE TOUCH LANCETS MISC Test 3 times daily with glucometer. Dx Type 2 diabetes with unspecified complications. 06/10/15  Yes Dorena Dew, FNP  RELION PEN NEEDLE 31G/8MM 31G X 8 MM MISC USE AS DIRECTED 4 TIMES DAILY 12/20/17  Yes Lanae Boast, FNP  TRESIBA FLEXTOUCH 200 UNIT/ML FlexTouch Pen  Inject 44 Units into the skin daily. 08/25/19  Yes Dorena Dew, FNP  Triamcinolone Acetonide (TRIAMCINOLONE 0.1 % CREAM : EUCERIN) CREA Apply 1 application topically 3 (three) times daily as needed. 05/23/19  Yes Dorena Dew, FNP  hydrOXYzine (ATARAX/VISTARIL) 25 MG tablet Take 1-2 tablets (25-50 mg total) by mouth every 6 (six) hours as needed for itching. 11/06/19   Rachel [facility], MD  predniSONE (DELTASONE) 20 MG tablet Take 1 tablet (20 mg total) by mouth 2 (two) times daily with a meal. 11/06/19   Rachel [facility], MD    Family History Family History  Problem Relation Age of Onset  . Diabetes Mother  Social History Social History   Tobacco Use  . Smoking status: Never Smoker  . Smokeless tobacco: Never Used  Vaping Use  . Vaping Use: Never used  Substance Use Topics  . Alcohol use: No  . Drug use: No     Allergies   Tramadol, Adhesive [tape], Bactrim [sulfamethoxazole-trimethoprim], and Tylenol with codeine #3 [acetaminophen-codeine]   Review of Systems Review of Systems  See HPI Physical Exam Triage Vital Signs ED Triage Vitals  Enc Vitals Group     BP 11/06/19 1654 (!) 189/86     Pulse Rate 11/06/19 1654 88     Resp 11/06/19 1654 16     Temp 11/06/19 1654 98.5 F (36.9 C)     Temp Source 11/06/19 1654 Oral     SpO2 11/06/19 1654 100 %     Weight --      Height --      Head Circumference --      Peak Flow --      Pain Score 11/06/19 1651 7     Pain Loc --      Pain Edu? --      Excl. in [place]? --    No data found.  Updated Vital Signs BP (!) 189/86 (BP Location: Right Arm)   Pulse 88   Temp 98.5 F (36.9 C) (Oral)   Resp 16   SpO2 100%     Physical Exam Constitutional:      General: She is in acute distress.     Appearance: She is well-developed. She is obese.     Comments: Patient is grasping her hands tightly.  Slightly tremulous.  Appears quite upset  HENT:     Head: Normocephalic and atraumatic.     Mouth/Throat:      Pharynx: No posterior oropharyngeal erythema.  Eyes:     Conjunctiva/sclera: Conjunctivae normal.     Pupils: Pupils are equal, round, and reactive to light.  Cardiovascular:     Rate and Rhythm: Normal rate and regular rhythm.     Heart sounds: Normal heart sounds.  Pulmonary:     Effort: Pulmonary effort is normal. No respiratory distress.     Breath sounds: Normal breath sounds.  Musculoskeletal:        General: Normal range of motion.     Cervical back: Normal range of motion.  Skin:    General: Skin is warm and dry.     Comments: There is a papule on the right side of the face that measures 1 x 2 cm with surrounding erythema and swelling.  There is a similar problem on the back of the right arm.  There is no body wide rash, chest back arms and legs are examined.  Neurological:     Mental Status: She is alert.      UC Treatments / Results  Labs (all labs ordered are listed, but only abnormal results are displayed) Labs Reviewed - No data to display  EKG   Radiology No results found.  Procedures Procedures (including critical care time)  Medications Ordered in UC Medications - No data to display  Initial Impression / Assessment and Plan / UC Course  I have reviewed the triage vital signs and the nursing notes.  Pertinent labs & imaging results that were available during my care of the patient were reviewed by me and considered in my medical decision making (see chart for details).     Patient has these 2 isolated bites that appear to be  causing her to have intense pruritus.  We will give her a couple days of prednisone with cautions regarding her sugar as discussed above.  Will switch her from Benadryl to Atarax to see if this gives her better control of her itching.  She can follow-up with her primary care doctor Final Clinical Impressions(s) / UC Diagnoses   Final diagnoses:  Allergic reaction, initial encounter     Discharge Instructions     Use  hydroxyzine for itching instead of Benadryl. Take prednisone 2 times a day for 5 days.  May stop early if the swelling and pain go down Watch sugar carefully while you are on prednisone.  Make sure you follow a diabetic diet     ED Prescriptions    Medication Sig Dispense Auth. Provider   predniSONE (DELTASONE) 20 MG tablet Take 1 tablet (20 mg total) by mouth 2 (two) times daily with a meal. 10 tablet Rachel [facility], MD   hydrOXYzine (ATARAX/VISTARIL) 25 MG tablet Take 1-2 tablets (25-50 mg total) by mouth every 6 (six) hours as needed for itching. 20 tablet Rachel [facility], MD     PDMP not reviewed this encounter.   Rachel [facility], MD 11/07/19 [phone]

## 2019-11-06 NOTE — ED Triage Notes (Signed)
Pt c/o rash all over body onset Saturday. Pt states first rash was on her face. Pt states she has been taking Benadryl and a topical ointment but rash is still itchy.

## 2019-11-16 ENCOUNTER — Other Ambulatory Visit: Payer: Self-pay

## 2019-11-16 DIAGNOSIS — E1165 Type 2 diabetes mellitus with hyperglycemia: Secondary | ICD-10-CM

## 2019-11-16 MED ORDER — GABAPENTIN 300 MG PO CAPS: 300.0000 mg | ORAL_CAPSULE | Freq: Two times a day (BID) | ORAL | 0 refills | Status: DC

## 2019-11-24 ENCOUNTER — Other Ambulatory Visit: Payer: Self-pay

## 2019-11-24 MED ORDER — OMEPRAZOLE 20 MG PO CPDR: 20.0000 mg | DELAYED_RELEASE_CAPSULE | Freq: Every day | ORAL | 1 refills | Status: DC

## 2019-11-28 ENCOUNTER — Ambulatory Visit (INDEPENDENT_AMBULATORY_CARE_PROVIDER_SITE_OTHER): Payer: Medicare HMO | Admitting: Family Medicine

## 2019-11-28 ENCOUNTER — Encounter: Payer: Self-pay | Admitting: Family Medicine

## 2019-11-28 ENCOUNTER — Other Ambulatory Visit: Payer: Self-pay

## 2019-11-28 VITALS — BP 163/62 | HR 64 | Temp 97.1°F | Ht 64.0 in | Wt 232.0 lb

## 2019-11-28 DIAGNOSIS — L304 Erythema intertrigo: Secondary | ICD-10-CM

## 2019-11-28 DIAGNOSIS — E1165 Type 2 diabetes mellitus with hyperglycemia: Secondary | ICD-10-CM

## 2019-11-28 DIAGNOSIS — R82998 Other abnormal findings in urine: Secondary | ICD-10-CM | POA: Diagnosis not present

## 2019-11-28 DIAGNOSIS — Z1231 Encounter for screening mammogram for malignant neoplasm of breast: Secondary | ICD-10-CM

## 2019-11-28 DIAGNOSIS — Z9189 Other specified personal risk factors, not elsewhere classified: Secondary | ICD-10-CM | POA: Diagnosis not present

## 2019-11-28 LAB — POCT URINALYSIS DIPSTICK
Bilirubin, UA: NEGATIVE
Glucose, UA: POSITIVE — AB
Ketones, UA: NEGATIVE
Leukocytes, UA: NEGATIVE
Nitrite, UA: NEGATIVE
Protein, UA: POSITIVE — AB
Spec Grav, UA: 1.025 (ref 1.010–1.025)
Urobilinogen, UA: 0.2 E.U./dL
pH, UA: 5.5 (ref 5.0–8.0)

## 2019-11-28 LAB — POCT GLYCOSYLATED HEMOGLOBIN (HGB A1C): Hemoglobin A1C: 14 % — AB (ref 4.0–5.6)

## 2019-11-28 MED ORDER — NYSTATIN 100000 UNIT/GM EX OINT: TOPICAL_OINTMENT | CUTANEOUS | 11 refills | Status: DC

## 2019-11-28 MED ORDER — TRULICITY 0.75 MG/0.5ML ~~LOC~~ SOAJ: 0.7500 mg | SUBCUTANEOUS | 5 refills | Status: DC

## 2019-11-28 MED ORDER — NYSTATIN 100000 UNIT/GM EX POWD: Freq: Three times a day (TID) | CUTANEOUS | 5 refills | Status: DC

## 2019-11-28 MED ORDER — INSULIN GLARGINE 100 UNITS/ML SOLOSTAR PEN: 45.0000 [IU] | PEN_INJECTOR | Freq: Every day | SUBCUTANEOUS | 11 refills | Status: DC

## 2019-11-28 NOTE — Progress Notes (Signed)
[name]    Established Patient Office Visit  Subjective:  Patient ID: Rachel Gonzalez, female    DOB: 12-29-1950  Age: 69 y.o. MRN: [id number]  CC:  Chief Complaint  Patient presents with  . Follow-up    patient hasn't had any insulin due to cost    HPI Rachel Gonzalez is a 69 year old female with a long history of uncontrolled type 2 diabetes mellitus on insulin, neuropathy, CKD 3, GERD, essential hypertension, osteoarthritis, and obesity presents for a follow up of chronic conditions. Patient is complaining of being out of insuline due to medication costs.  Patient states that medication costs are well above her budget and last month she was unable to have medications filled.  Also, patient has not been exercising or following a low-fat, low carbohydrate diet.   Diabetes She has type 2 diabetes mellitus. Associated symptoms include polydipsia and polyphagia. Pertinent negatives for diabetes include no blurred vision, no chest pain and no polyuria. Symptoms are stable. There are no diabetic complications. Pertinent negatives for diabetic complications include no CVA. Risk factors for coronary artery disease include obesity, sedentary lifestyle, post-menopausal and dyslipidemia. She is compliant with treatment all of the time. Her weight is stable. She is following a generally healthy diet. When asked about meal planning, she reported none. She has not had a previous visit with a dietitian. She rarely participates in exercise. She sees a podiatrist.Eye exam is current.  Hypertension This is a chronic problem. The problem is controlled. Associated symptoms include peripheral edema. Pertinent negatives include no anxiety, blurred vision or chest pain. Hypertensive end-organ damage includes kidney disease. There is no history of CVA or heart failure.     Past Medical History:  Diagnosis Date  . Anemia   . Arthritis    right finger  .  Diabetes mellitus   . GERD (gastroesophageal reflux disease)   . Hypertension   . Renal disorder     Past Surgical History:  Procedure Laterality Date  . AMPUTATION Right 05/08/2014   Procedure: AMPUTATION RAY, right great toe;  Surgeon: Newt Minion, MD;  Location: [facility];  Service: Orthopedics;  Laterality: Right;  . arm surgery     . CESAREAN SECTION    . COLONOSCOPY    . I & D EXTREMITY Right 05/08/2014   Procedure: IRRIGATION AND DEBRIDEMENT FOOT;  Surgeon: Newt Minion, MD;  Location: [facility];  Service: Orthopedics;  Laterality: Right;  . MEMBRANE PEEL Right 03/15/2018   Procedure: MEMBRANE PEEL;  Surgeon: Jalene Mullet, MD;  Location: [facility];  Service: Ophthalmology;  Laterality: Right;  . PHOTOCOAGULATION WITH LASER Right 03/15/2018   Procedure: PHOTOCOAGULATION WITH LASER;  Surgeon: Jalene Mullet, MD;  Location: [facility];  Service: Ophthalmology;  Laterality: Right;  . 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 Diabetes Mother     Social History   Socioeconomic History  . Marital status: Married    Spouse name: Not on file  . Number of children: Not on file  . Years of education: Not on file  . Highest education level: Not on file  Occupational History  . Not on file  Tobacco Use  . Smoking status:  Never Smoker  . Smokeless tobacco: Never Used  Vaping Use  . Vaping Use: Never used  Substance and Sexual Activity  . Alcohol use: No  . Drug use: No  . Sexual activity: Not on file  Other Topics Concern  . Not on file  Social History Narrative   From [place].   Married.   Three children, 9 grandchildren.   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    Travels to [place], [place]   Social Determinants of Health   Financial Resource Strain:   . Difficulty of Paying Living Expenses: Not on file  Food Insecurity:   . Worried About Charity fundraiser in the Last Year: Not on file  . Ran Out of Food in the Last Year: Not on file  Transportation Needs:   . Lack of Transportation (Medical): Not on file  . Lack of Transportation (Non-Medical): Not on file  Physical Activity:   . Days of Exercise per Week: Not on file  . Minutes of Exercise per Session: Not on file  Stress:   . Feeling of Stress : Not on file  Social Connections:   . Frequency of Communication with Friends and Family: Not on file  . Frequency of Social Gatherings with Friends and Family: Not on file  . Attends Religious Services: Not on file  . Active Member of Clubs or Organizations: Not on file  . Attends Archivist Meetings: Not on file  . Marital Status: Not on file  Intimate Partner Violence:   . Fear of Current or Ex-Partner: Not on file  . Emotionally Abused: Not on file  . Physically Abused: Not on file  . Sexually Abused: Not on file    Outpatient Medications Prior to Visit  Medication Sig Dispense Refill  . acetaminophen (TYLENOL) 500 MG tablet Take 500-1,000 mg by mouth every 6 (six) hours as needed (pain.).    Marland Kitchen acetaminophen-codeine (TYLENOL #3) 300-30 MG tablet TAKE 1 TABLET BY MOUTH EVERY 6 HOURS AS NEEDED FOR MODERATE PAIN 30 tablet 0  . amLODipine (NORVASC) 10 MG tablet Take 1 tablet (10 mg total) by mouth daily. 90 tablet 3  . atorvastatin (LIPITOR) 10 MG tablet Take 1 tablet (10 mg total) by mouth daily. 90 tablet 3  . Blood Glucose Monitoring Suppl (ACCU-CHEK AVIVA PLUS) w/Device KIT 1 Device by Does not apply route once. Use as directed to check blood sugar 1 kit 0  . Blood Glucose Monitoring Suppl (ONE TOUCH ULTRA SYSTEM KIT) w/Device KIT Test three times daily. Dx Type 2 Diabetes. 1 each 0  . furosemide (LASIX) 20 MG tablet  Take 1 tablet (20 mg total) by mouth daily. 30 tablet 11  . gabapentin (NEURONTIN) 300 MG capsule Take 1 capsule (300 mg total) by mouth 2 (two) times daily. 180 capsule 0  . hydrOXYzine (ATARAX/VISTARIL) 25 MG tablet Take 1-2 tablets (25-50 mg total) by mouth every 6 (six) hours as needed for itching. 20 tablet 0  . Lancet Devices (ACCU-CHEK SOFTCLIX) lancets Use as instructed 100 each 12  . omeprazole (PRILOSEC) 20 MG capsule Take 1 capsule (20 mg total) by mouth daily. 30 capsule 1  . ONE TOUCH LANCETS MISC Test 3 times daily with glucometer. Dx Type 2 diabetes with unspecified complications. 200 each 5  . RELION PEN NEEDLE 31G/8MM 31G X 8 MM MISC USE AS DIRECTED 4 TIMES DAILY 100 each 11  . Triamcinolone Acetonide (TRIAMCINOLONE 0.1 %  CREAM : EUCERIN) CREA Apply 1 application topically 3 (three) times daily as needed. 1 each 5  . nystatin (NYSTATIN) powder Apply topically 3 (three) times daily. 15 g 3  . nystatin ointment (MYCOSTATIN) APPLY 1 APPLICATION OF  OINTMENT TOPICALLY TWICE DAILY 30 g 3  . TRESIBA FLEXTOUCH 200 UNIT/ML FlexTouch Pen Inject 44 Units into the skin daily. 5 pen 0  . meloxicam (MOBIC) 15 MG tablet Take 1 tablet (15 mg total) by mouth daily. X 2 wks (Patient not taking: Reported on 11/28/2019) 30 tablet 0  . insulin glargine (LANTUS) 100 unit/mL SOPN Inject 0.45 mLs (45 Units total) into the skin daily. (Patient not taking: Reported on 11/28/2019) 15 mL 11  . predniSONE (DELTASONE) 20 MG tablet Take 1 tablet (20 mg total) by mouth 2 (two) times daily with a meal. (Patient not taking: Reported on 11/28/2019) 10 tablet 0   No facility-administered medications prior to visit.    Allergies  Allergen Reactions  . Tramadol Nausea And Vomiting  . Adhesive [Tape] Itching  . Bactrim [Sulfamethoxazole-Trimethoprim] Other (See Comments)    AKI and hyperkalemia   . Tylenol With Codeine #3 [Acetaminophen-Codeine] Diarrhea and Itching    ROS Review of Systems  Constitutional:  Negative.   HENT: Negative.   Eyes: Negative for blurred vision.  Respiratory: Negative.   Cardiovascular: Negative.  Negative for chest pain.  Gastrointestinal: Negative.   Endocrine: Positive for polydipsia and polyphagia. Negative for polyuria.  Genitourinary: Negative.   Hematological: Negative.       Objective:    Physical Exam Constitutional:      Appearance: Normal appearance.  Eyes:     Pupils: Pupils are equal, round, and reactive to light.  Cardiovascular:     Rate and Rhythm: Normal rate and regular rhythm.     Pulses: Normal pulses.  Pulmonary:     Effort: Pulmonary effort is normal.  Abdominal:     General: Bowel sounds are normal.  Neurological:     General: No focal deficit present.     Mental Status: She is alert. Mental status is at baseline.  Psychiatric:        Mood and Affect: Mood normal.        Behavior: Behavior normal.        Thought Content: Thought content normal.        Judgment: Judgment normal.     BP (!) 163/62   Pulse 64   Temp (!) 97.1 F (36.2 C) (Temporal)   Ht 5' 4"  (1.626 m)   Wt 232 lb (105.2 kg)   SpO2 99%   BMI 39.82 kg/m  Wt Readings from Last 3 Encounters:  11/28/19 232 lb (105.2 kg)  08/29/19 225 lb 6.4 oz (102.2 kg)  05/30/19 220 lb 0.3 oz (99.8 kg)     Health Maintenance Due  Topic Date Due  . COLONOSCOPY  Never done  . DEXA SCAN  Never done  . MAMMOGRAM  04/22/2017    There are no preventive care reminders to display for this patient.  Lab Results  Component Value Date   TSH 2.025 02/12/2015   Lab Results  Component Value Date   WBC 6.0 06/26/2019   HGB 10.0 (L) 06/26/2019   HCT 32.7 (L) 06/26/2019   MCV 84 06/26/2019   PLT 268 06/26/2019   Lab Results  Component Value Date   NA 140 09/13/2019   K 5.2 09/13/2019   CO2 25 09/13/2019   GLUCOSE 319 (H) 09/13/2019  BUN 24 09/13/2019   CREATININE 1.74 (H) 09/13/2019   BILITOT 0.3 08/29/2019   ALKPHOS 190 (H) 08/29/2019   AST 9 08/29/2019    ALT 8 08/29/2019   PROT 6.9 08/29/2019   ALBUMIN 3.9 08/29/2019   CALCIUM 8.7 09/13/2019   ANIONGAP 9 06/01/2019   GFR 63.19 11/07/2014   Lab Results  Component Value Date   CHOL 156 06/26/2019   Lab Results  Component Value Date   HDL 78 06/26/2019   Lab Results  Component Value Date   LDLCALC 68 06/26/2019   Lab Results  Component Value Date   TRIG 43 06/26/2019   Lab Results  Component Value Date   CHOLHDL 2.0 06/26/2019   Lab Results  Component Value Date   HGBA1C 10.5 (A) 08/29/2019   HGBA1C 10.5 08/29/2019   HGBA1C 10.5 (A) 08/29/2019   HGBA1C 10.5 (A) 08/29/2019      Assessment & Plan:   Problem List Items Addressed This Visit      Endocrine   Uncontrolled type 2 diabetes mellitus with hyperglycemia (HCC)   Relevant Medications   insulin glargine (LANTUS) 100 unit/mL SOPN   Dulaglutide (TRULICITY) 4.74 QV/9.5GL SOPN   Other Relevant Orders   Comprehensive metabolic panel   HgB O7F    Other Visit Diagnoses    At risk for bone density loss    -  Primary   Relevant Orders   DG Bone Density   Breast cancer screening by mammogram       Relevant Orders   MM Digital Screening   Intertrigo       Relevant Medications   nystatin ointment (MYCOSTATIN)   nystatin (NYSTATIN) powder      Meds ordered this encounter  Medications  . insulin glargine (LANTUS) 100 unit/mL SOPN    Sig: Inject 45 Units into the skin daily.    Dispense:  48 each    Refill:  11    Order Specific Question:   Supervising Provider    Answer:   Tresa Garter [id number]  . Dulaglutide (TRULICITY) 6.43 PI/9.5JO SOPN    Sig: Inject 0.75 mg into the skin once a week.    Dispense:  0.5 mL    Refill:  5    Order Specific Question:   Supervising Provider    Answer:   Tresa Garter [id number]  . nystatin ointment (MYCOSTATIN)    Sig: APPLY 1 APPLICATION OF  OINTMENT TOPICALLY TWICE DAILY    Dispense:  30 g    Refill:  11    Order Specific Question:   Supervising  Provider    Answer:   Tresa Garter [id number]  . nystatin (NYSTATIN) powder    Sig: Apply topically 3 (three) times daily.    Dispense:  60 g    Refill:  5    Order Specific Question:   Supervising Provider    Answer:   Angelica Chessman E [[id number]]   3. Uncontrolled type 2 diabetes mellitus with hyperglycemia (HCC) Changed medication to Lantus, 45 units at bedtime. Add trulicity 0.16 mg weekly.  Return in 1 month  - Comprehensive metabolic panel - HgB W1U - POCT urinalysis dipstick - Dulaglutide (TRULICITY) 9.32 TF/5.7DU SOPN; Inject 0.75 mg into the skin once a week.  Dispense: 0.5 mL; Refill: 5 - insulin glargine (LANTUS SOLOSTAR) 100 UNIT/ML Solostar Pen; Inject 45 Units into the skin at bedtime.  Dispense: 15 mL; Refill: PRN  2. At risk for bone density loss - DG Bone  Density; Future  3. Breast cancer screening by mammogram - MM Digital Screening; Future  4. Intertrigo - nystatin (NYSTATIN) powder; Apply topically 3 (three) times daily.  Dispense: 60 g; Refill: 5  Follow-up: Return in about 3 months (around 02/27/2020).    Rachel Pounds  APRN, MSN, FNP-C [facility] [address] [place], [place] [address] [phone]

## 2019-11-28 NOTE — Patient Instructions (Signed)
Dulaglutide injection What is this medicine? DULAGLUTIDE (DOO la GLOO tide) is used to improve blood sugar control in adults with type 2 diabetes. This medicine may be used with other oral diabetes medicines. This drug may also reduce the risk of heart attack or stroke if you have type 2 diabetes and risk factors for heart disease. This medicine may be used for other purposes; ask your health care provider or pharmacist if you have questions. COMMON BRAND NAME(S): Trulicity What should I tell my health care provider before I take this medicine? They need to know if you have any of these conditions:  endocrine tumors (MEN 2) or if someone in your family had these tumors  eye disease, vision problems  history of pancreatitis  kidney disease  liver disease  stomach problems  thyroid cancer or if someone in your family had thyroid cancer  an unusual or allergic reaction to dulaglutide, other medicines, foods, dyes, or preservatives  pregnant or trying to get pregnant  breast-feeding How should I use this medicine? This medicine is for injection under the skin of your upper leg (thigh), stomach area, or upper arm. It is usually given once every week (every 7 days). You will be taught how to prepare and give this medicine. Use exactly as directed. Take your medicine at regular intervals. Do not take it more often than directed. If you use this medicine with insulin, you should inject this medicine and the insulin separately. Do not mix them together. Do not give the injections right next to each other. Change (rotate) injection sites with each injection. It is important that you put your used needles and syringes in a special sharps container. Do not put them in a trash can. If you do not have a sharps container, call your pharmacist or healthcare provider to get one. A special MedGuide will be given to you by the pharmacist with each prescription and refill. Be sure to read this information  carefully each time. This drug comes with INSTRUCTIONS FOR USE. Ask your pharmacist for directions on how to use this drug. Read the information carefully. Talk to your pharmacist or health care provider if you have questions. Talk to your pediatrician regarding the use of this medicine in children. Special care may be needed. Overdosage: If you think you have taken too much of this medicine contact a poison control center or emergency room at once. NOTE: This medicine is only for you. Do not share this medicine with others. What if I miss a dose? If you miss a dose, take it as soon as you can within 3 days after the missed dose. Then take your next dose at your regular weekly time. If it has been longer than 3 days after the missed dose, do not take the missed dose. Take the next dose at your regular time. Do not take double or extra doses. If you have questions about a missed dose, contact your health care provider for advice. What may interact with this medicine?  other medicines for diabetes Many medications may cause changes in blood sugar, these include:  alcohol containing beverages  antiviral medicines for HIV or AIDS  aspirin and aspirin-like drugs  certain medicines for blood pressure, heart disease, irregular heart beat  chromium  diuretics  female hormones, such as estrogens or progestins, birth control pills  fenofibrate  gemfibrozil  isoniazid  lanreotide  female hormones or anabolic steroids  MAOIs like Carbex, Eldepryl, Marplan, Nardil, and Parnate  medicines for weight   loss  medicines for allergies, asthma, cold, or cough  medicines for depression, anxiety, or psychotic disturbances  niacin  nicotine  NSAIDs, medicines for pain and inflammation, like ibuprofen or naproxen  octreotide  pasireotide  pentamidine  phenytoin  probenecid  quinolone antibiotics such as ciprofloxacin, levofloxacin, ofloxacin  some herbal dietary  supplements  steroid medicines such as prednisone or cortisone  sulfamethoxazole; trimethoprim  thyroid hormones Some medications can hide the warning symptoms of low blood sugar (hypoglycemia). You may need to monitor your blood sugar more closely if you are taking one of these medications. These include:  beta-blockers, often used for high blood pressure or heart problems (examples include atenolol, metoprolol, propranolol)  clonidine  guanethidine  reserpine This list may not describe all possible interactions. Give your health care provider a list of all the medicines, herbs, non-prescription drugs, or dietary supplements you use. Also tell them if you smoke, drink alcohol, or use illegal drugs. Some items may interact with your medicine. What should I watch for while using this medicine? Visit your doctor or health care professional for regular checks on your progress. Drink plenty of fluids while taking this medicine. Check with your doctor or health care professional if you get an attack of severe diarrhea, nausea, and vomiting. The loss of too much body fluid can make it dangerous for you to take this medicine. A test called the HbA1C (A1C) will be monitored. This is a simple blood test. It measures your blood sugar control over the last 2 to 3 months. You will receive this test every 3 to 6 months. Learn how to check your blood sugar. Learn the symptoms of low and high blood sugar and how to manage them. Always carry a quick-source of sugar with you in case you have symptoms of low blood sugar. Examples include hard sugar candy or glucose tablets. Make sure others know that you can choke if you eat or drink when you develop serious symptoms of low blood sugar, such as seizures or unconsciousness. They must get medical help at once. Tell your doctor or health care professional if you have high blood sugar. You might need to change the dose of your medicine. If you are sick or  exercising more than usual, you might need to change the dose of your medicine. Do not skip meals. Ask your doctor or health care professional if you should avoid alcohol. Many nonprescription cough and cold products contain sugar or alcohol. These can affect blood sugar. Pens should never be shared. Even if the needle is changed, sharing may result in passing of viruses like hepatitis or HIV. Wear a medical ID bracelet or chain, and carry a card that describes your disease and details of your medicine and dosage times. What side effects may I notice from receiving this medicine? Side effects that you should report to your doctor or health care professional as soon as possible:  allergic reactions like skin rash, itching or hives, swelling of the face, lips, or tongue  breathing problems  changes in vision  diarrhea that continues or is severe  lump or swelling on the neck  severe nausea  signs and symptoms of infection like fever or chills; cough; sore throat; pain or trouble passing urine  signs and symptoms of low blood sugar such as feeling anxious, confusion, dizziness, increased hunger, unusually weak or tired, sweating, shakiness, cold, irritable, headache, blurred vision, fast heartbeat, loss of consciousness  signs and symptoms of kidney injury like trouble passing   urine or change in the amount of urine  trouble swallowing  unusual stomach upset or pain  vomiting Side effects that usually do not require medical attention (report to your doctor or health care professional if they continue or are bothersome):  diarrhea  loss of appetite  nausea  pain, redness, or irritation at site where injected  stomach upset This list may not describe all possible side effects. Call your doctor for medical advice about side effects. You may report side effects to FDA at 1-800-FDA-1088. Where should I keep my medicine? Keep out of the reach of children. Store unopened pens in a  refrigerator between 2 and 8 degrees C (36 and 46 degrees F). Do not freeze or use if the medicine has been frozen. Protect from light and excessive heat. Store in the carton until use. Each single-dose pen can be kept at room temperature, not to exceed 30 degrees C (86 degrees F) for a total of 14 days, if needed. Throw away any unused medicine after the expiration date on the label. NOTE: This sheet is a summary. It may not cover all possible information. If you have questions about this medicine, talk to your doctor, pharmacist, or health care provider.  2020 Elsevier/Gold Standard (2018-11-08 09:34:53)  

## 2019-11-29 ENCOUNTER — Other Ambulatory Visit: Payer: Self-pay

## 2019-11-29 ENCOUNTER — Telehealth: Payer: Self-pay | Admitting: Family Medicine

## 2019-11-29 DIAGNOSIS — H35373 Puckering of macula, bilateral: Secondary | ICD-10-CM | POA: Diagnosis not present

## 2019-11-29 DIAGNOSIS — H3582 Retinal ischemia: Secondary | ICD-10-CM | POA: Diagnosis not present

## 2019-11-29 DIAGNOSIS — Z961 Presence of intraocular lens: Secondary | ICD-10-CM | POA: Diagnosis not present

## 2019-11-29 DIAGNOSIS — E113513 Type 2 diabetes mellitus with proliferative diabetic retinopathy with macular edema, bilateral: Secondary | ICD-10-CM | POA: Diagnosis not present

## 2019-11-29 DIAGNOSIS — E1165 Type 2 diabetes mellitus with hyperglycemia: Secondary | ICD-10-CM

## 2019-11-29 DIAGNOSIS — L304 Erythema intertrigo: Secondary | ICD-10-CM

## 2019-11-29 LAB — COMPREHENSIVE METABOLIC PANEL
ALT: 10 IU/L (ref 0–32)
AST: 9 IU/L (ref 0–40)
Albumin/Globulin Ratio: 1.5 (ref 1.2–2.2)
Albumin: 3.8 g/dL (ref 3.8–4.8)
Alkaline Phosphatase: 194 IU/L — ABNORMAL HIGH (ref 44–121)
BUN/Creatinine Ratio: 20 (ref 12–28)
BUN: 31 mg/dL — ABNORMAL HIGH (ref 8–27)
Bilirubin Total: 0.2 mg/dL (ref 0.0–1.2)
CO2: 25 mmol/L (ref 20–29)
Calcium: 9.5 mg/dL (ref 8.7–10.3)
Chloride: 103 mmol/L (ref 96–106)
Creatinine, Ser: 1.55 mg/dL — ABNORMAL HIGH (ref 0.57–1.00)
GFR calc Af Amer: 39 mL/min/{1.73_m2} — ABNORMAL LOW (ref >59)
GFR calc non Af Amer: 34 mL/min/{1.73_m2} — ABNORMAL LOW (ref >59)
Globulin, Total: 2.6 g/dL (ref 1.5–4.5)
Glucose: 204 mg/dL — ABNORMAL HIGH (ref 65–99)
Potassium: 4.9 mmol/L (ref 3.5–5.2)
Sodium: 141 mmol/L (ref 134–144)
Total Protein: 6.4 g/dL (ref 6.0–8.5)

## 2019-11-29 MED ORDER — NYSTATIN 100000 UNIT/GM EX OINT: TOPICAL_OINTMENT | CUTANEOUS | 0 refills | Status: DC

## 2019-11-29 MED ORDER — TRULICITY 0.75 MG/0.5ML ~~LOC~~ SOAJ: 0.7500 mg | SUBCUTANEOUS | 0 refills | Status: DC

## 2019-11-29 NOTE — Telephone Encounter (Signed)
Resubmitted requested medications to [facility]

## 2019-11-30 MED ORDER — LANTUS SOLOSTAR 100 UNIT/ML ~~LOC~~ SOPN: 45.0000 [IU] | PEN_INJECTOR | Freq: Every day | SUBCUTANEOUS | 99 refills | Status: DC

## 2019-11-30 MED ORDER — TRULICITY 0.75 MG/0.5ML ~~LOC~~ SOAJ: 0.7500 mg | SUBCUTANEOUS | 5 refills | Status: DC

## 2019-12-01 ENCOUNTER — Telehealth: Payer: Self-pay

## 2019-12-01 NOTE — Telephone Encounter (Signed)
-----   Message from Massie Maroon, [place] sent at 11/29/2019  1:19 PM EDT ----- Regarding: lab results I will send medications to [facility], while she is awaiting [facility] prescriptions.   Nolon Nations  APRN, MSN, FNP-C [facility] [address] [place], [name] [address] [phone]

## 2019-12-17 ENCOUNTER — Other Ambulatory Visit: Payer: Self-pay

## 2019-12-17 ENCOUNTER — Encounter (HOSPITAL_COMMUNITY): Payer: Self-pay

## 2019-12-17 ENCOUNTER — Ambulatory Visit (HOSPITAL_COMMUNITY)
Admission: EM | Admit: 2019-12-17 | Discharge: 2019-12-17 | Disposition: A | Discharge status: Home / Self Care | Payer: Medicare HMO | Attending: Family Medicine | Admitting: Family Medicine

## 2019-12-17 DIAGNOSIS — L989 Disorder of the skin and subcutaneous tissue, unspecified: Secondary | ICD-10-CM | POA: Diagnosis not present

## 2019-12-17 MED ORDER — MUPIROCIN 2 % EX OINT: TOPICAL_OINTMENT | Freq: Two times a day (BID) | CUTANEOUS | 0 refills | Status: DC

## 2019-12-17 NOTE — ED Notes (Signed)
Triple antibiotic ointment and non-adherent dsg placed and ace wrap applied.

## 2019-12-17 NOTE — ED Triage Notes (Signed)
Pt reports having a blister in the top of the right foot x 3-4 days.Pt reports the blister is worse when she use A&D+ E cream

## 2019-12-17 NOTE — ED Notes (Signed)
Sec. Called Pt on cell phone ,no answer

## 2019-12-17 NOTE — ED Notes (Signed)
Called Pt from [facility] no answer.

## 2019-12-17 NOTE — ED Provider Notes (Signed)
[place]    CSN: [id number] Arrival date & time: 12/17/19  1522      History   Chief Complaint Chief Complaint  Patient presents with  . Blister    HPI Rachel Gonzalez is a 69 y.o. female.   Here today concerned about a large blister on right anterior lower leg that has been there several days now and just started weeping this morning. States her legs stay swollen, recently started on lasix through PCP which she reports she is compliant with. Does not wear compression stockings or elevate legs. Has been using A and D ointment to the area which she states seems to make it worse if anything. Denies pain, redness, heat, fever, chills. Has had a few of these pop up in this area recently but never prior to that.      Past Medical History:  Diagnosis Date  . Anemia   . Arthritis    right finger  . Diabetes mellitus   . GERD (gastroesophageal reflux disease)   . Hypertension   . Renal disorder     Patient Active Problem List   Diagnosis Date Noted  . Acute kidney injury superimposed on chronic kidney disease ([place]) 05/30/2019  . Stage 3 chronic kidney disease ([place]) 12/01/2016  . Frequent falls 04/28/2016  . Right arm weakness 04/28/2016  . Bilateral lower extremity edema 04/28/2016  . Screening for colon cancer 04/28/2016  . Abnormal urinalysis 09/13/2014  . Chest pain 09/13/2014  . Abdominal pain, recurrent 09/13/2014  . Dehydration with hyponatremia 09/13/2014  . Acute hyperkalemia 09/13/2014  . Acute renal failure superimposed on stage 3 chronic kidney disease ([place]) 09/13/2014  . Anemia 05/06/2014  . Diabetic neuropathy, type II diabetes mellitus ([place]) 05/06/2014  . Obesity (BMI 30-39.9) 05/06/2014  . Cellulitis of right foot 05/06/2014  . Essential hypertension 03/07/2011  . 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 Right arm pain 08/04/2006  . Osteoarthritis 02/14/2006  . Uncontrolled type 2 diabetes mellitus with hyperglycemia  ([place]) 01/11/2006    Past Surgical History:  Procedure Laterality Date  . AMPUTATION Right 05/08/2014   Procedure: AMPUTATION RAY, right great toe;  Surgeon: Newt Minion, MD;  Location: [facility];  Service: Orthopedics;  Laterality: Right;  . arm surgery     . CESAREAN SECTION    . COLONOSCOPY    . I & D EXTREMITY Right 05/08/2014   Procedure: IRRIGATION AND DEBRIDEMENT FOOT;  Surgeon: Newt Minion, MD;  Location: [facility];  Service: Orthopedics;  Laterality: Right;  . MEMBRANE PEEL Right 03/15/2018   Procedure: MEMBRANE PEEL;  Surgeon: Jalene Mullet, MD;  Location: [facility];  Service: Ophthalmology;  Laterality: Right;  . PHOTOCOAGULATION WITH LASER Right 03/15/2018   Procedure: PHOTOCOAGULATION WITH LASER;  Surgeon: Jalene Mullet, MD;  Location: [facility];  Service: Ophthalmology;  Laterality: Right;  . 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 Authorizing Provider  acetaminophen (TYLENOL) 500 MG tablet Take 500-1,000 mg by mouth every 6 (six) hours as needed (pain.).    [provider]  acetaminophen-codeine (TYLENOL #3) 300-30 MG tablet TAKE 1 TABLET BY MOUTH EVERY 6 HOURS AS NEEDED FOR MODERATE PAIN 05/23/19   Dorena Dew, FNP  amLODipine (NORVASC) 10 MG tablet Take 1 tablet (10 mg total) by mouth daily. 05/23/19   Dorena Dew, FNP  atorvastatin (LIPITOR) 10 MG tablet Take 1 tablet (10 mg total) by mouth daily. 11/09/18   Lanae Boast, FNP  Blood Glucose Monitoring Suppl  (ACCU-CHEK AVIVA PLUS) w/Device KIT 1 Device by Does not apply route once. Use as directed to check blood sugar 06/11/15   Dorena Dew, FNP  Blood Glucose Monitoring Suppl (ONE TOUCH ULTRA SYSTEM KIT) w/Device KIT Test three times daily. Dx Type 2 Diabetes. 06/10/15   Dorena Dew, FNP  Dulaglutide (TRULICITY) 3.42 AJ/6.8TL SOPN Inject 0.75 mg into the skin once a week. 11/29/19   Dorena Dew, FNP  Dulaglutide (TRULICITY) 5.72 IO/0.3TD SOPN Inject 0.75 mg into the skin once a week. 11/30/19   Dorena Dew, FNP  furosemide (LASIX) 20 MG tablet Take 1 tablet (20 mg total) by mouth daily. 09/01/19 08/31/20  Dorena Dew, FNP  gabapentin (NEURONTIN) 300 MG capsule Take 1 capsule (300 mg total) by mouth 2 (two) times daily. 11/16/19   Dorena Dew, FNP  hydrOXYzine (ATARAX/VISTARIL) 25 MG tablet Take 1-2 tablets (25-50 mg total) by mouth every 6 (six) hours as needed for itching. 11/06/19   Raylene Everts, MD  insulin glargine (LANTUS SOLOSTAR) 100 UNIT/ML Solostar Pen Inject 45 Units into the skin at bedtime. 11/30/19   Dorena Dew, FNP  Lancet Devices [facility]) lancets Use as instructed 06/11/15   Dorena Dew, FNP  meloxicam (MOBIC) 15 MG tablet Take 1 tablet (15 mg total) by mouth daily. X 2 wks Patient not taking: Reported on 11/28/2019 06/05/19   Meredith Pel, MD  mupirocin ointment (BACTROBAN) 2 % Apply topically 2 (two) times daily. 12/17/19   Volney American, PA-C  nystatin (NYSTATIN) powder Apply topically 3 (three) times daily. 11/28/19   Dorena Dew, FNP  nystatin ointment (MYCOSTATIN) APPLY 1 APPLICATION OF  OINTMENT TOPICALLY TWICE DAILY 11/29/19   Dorena Dew, FNP  omeprazole (PRILOSEC) 20 MG capsule Take 1 capsule (20 mg total) by mouth daily. 11/24/19   Dorena Dew, FNP  ONE TOUCH LANCETS MISC Test 3 times daily with glucometer. Dx Type 2 diabetes with unspecified complications. 06/10/15   Dorena Dew, FNP  RELION  PEN NEEDLE 31G/8MM 31G X 8 MM MISC USE AS DIRECTED 4 TIMES DAILY 12/20/17   Lanae Boast, FNP  Triamcinolone Acetonide (TRIAMCINOLONE 0.1 % CREAM : EUCERIN) CREA Apply 1 application topically 3 (three) times daily as needed. 05/23/19   Dorena Dew, FNP    Family History Family History  Problem Relation Age of Onset  . Diabetes Mother     Social History Social History   Tobacco Use  . Smoking status: Never Smoker  . Smokeless tobacco: Never Used  Vaping Use  . Vaping Use: Never used  Substance Use Topics  . Alcohol use: No  . Drug use: No  Allergies   Tramadol, Adhesive [tape], Bactrim [sulfamethoxazole-trimethoprim], and Tylenol with codeine #3 [acetaminophen-codeine]   Review of Systems Review of Systems PER HPI    Physical Exam Triage Vital Signs ED Triage Vitals  Enc Vitals Group     BP 12/17/19 1631 (!) 192/99     Pulse Rate 12/17/19 1631 75     Resp 12/17/19 1631 16     Temp 12/17/19 1631 98 F (36.7 C)     Temp Source 12/17/19 1631 Oral     SpO2 12/17/19 1631 96 %     Weight --      Height --      Head Circumference --      Peak Flow --      Pain Score 12/17/19 1626 7     Pain Loc --      Pain Edu? --      Excl. in [place]? --    No data found.  Updated Vital Signs BP (!) 192/99 (BP Location: Left Arm)   Pulse 75   Temp 98 F (36.7 C) (Oral)   Resp 16   SpO2 96%   Visual Acuity Right Eye Distance:   Left Eye Distance:   Bilateral Distance:    Right Eye Near:   Left Eye Near:    Bilateral Near:     Physical Exam Vitals and nursing note reviewed.  Constitutional:      Appearance: Normal appearance. She is not ill-appearing.  HENT:     Head: Atraumatic.  Eyes:     Extraocular Movements: Extraocular movements intact.     Conjunctiva/sclera: Conjunctivae normal.  Cardiovascular:     Rate and Rhythm: Normal rate and regular rhythm.     Heart sounds: Normal heart sounds.  Pulmonary:     Effort: Pulmonary effort is normal.      Breath sounds: Normal breath sounds.  Musculoskeletal:        General: Swelling (2+ edema b/l LEs symmetrically) present. No tenderness. Normal range of motion.     Cervical back: Normal range of motion and neck supple.  Skin:    General: Skin is warm.     Findings: No erythema.     Comments: 1 cm blister weeping clear fluid right anterior lower leg, much smaller blister present just above it. Hyperpigmented area opposite leg where she states one used to be but has since gone  Neurological:     Mental Status: She is alert and oriented to person, place, and time.  Psychiatric:        Mood and Affect: Mood normal.        Thought Content: Thought content normal.        Judgment: Judgment normal.      UC Treatments / Results  Labs (all labs ordered are listed, but only abnormal results are displayed) Labs Reviewed - No data to display  EKG   Radiology No results found.  Procedures Procedures (including critical care time)  Medications Ordered in UC Medications - No data to display  Initial Impression / Assessment and Plan / UC Course  I have reviewed the triage vital signs and the nursing notes.  Pertinent labs & imaging results that were available during my care of the patient were reviewed by me and considered in my medical decision making (see chart for details).     Suspect blisters from pressure in leg/fluid retention. Continue lasix, reviewed compression stockings, leg elevation, exercise, and close f/u with primary care both for her edema  and her significantly elevated blood pressures. bactroban sent to apply to area and wound care reviewed to avoid infection though no evidence at this time of an infection in the area. Return precautions given.   Final Clinical Impressions(s) / UC Diagnoses   Final diagnoses:  None   Discharge Instructions   None    ED Prescriptions    Medication Sig Dispense Auth. Provider   mupirocin ointment (BACTROBAN) 2 % Apply  topically 2 (two) times daily. 22 g Volney American, [place]     PDMP not reviewed this encounter.   Volney American, [place] 12/17/19 1730

## 2019-12-18 ENCOUNTER — Other Ambulatory Visit: Payer: Self-pay | Admitting: Family Medicine

## 2019-12-18 ENCOUNTER — Telehealth: Payer: Self-pay | Admitting: Podiatry

## 2019-12-18 NOTE — Telephone Encounter (Signed)
Pt left message asking if her prosthetics have came in yet.  Upon looking she had diabetic shoes and inserts with a toe filler insert but we have not received paperwork from the md/do that oversees [employer] fnp. I have refaxed paperwork to a different fax number today.

## 2019-12-19 ENCOUNTER — Ambulatory Visit: Payer: Medicare HMO | Admitting: Podiatry

## 2019-12-28 ENCOUNTER — Telehealth: Payer: Self-pay | Admitting: Family Medicine

## 2019-12-28 NOTE — Telephone Encounter (Signed)
Requesting conversation concerning diabetic medication. Pt may benefit from statin to reduce risk of cardiovascular events per acc & aha guidelines. Please discuss with pt at next visit

## 2020-01-03 DIAGNOSIS — E113513 Type 2 diabetes mellitus with proliferative diabetic retinopathy with macular edema, bilateral: Secondary | ICD-10-CM | POA: Diagnosis not present

## 2020-01-03 DIAGNOSIS — H3582 Retinal ischemia: Secondary | ICD-10-CM | POA: Diagnosis not present

## 2020-01-03 DIAGNOSIS — H401112 Primary open-angle glaucoma, right eye, moderate stage: Secondary | ICD-10-CM | POA: Diagnosis not present

## 2020-01-03 DIAGNOSIS — H31093 Other chorioretinal scars, bilateral: Secondary | ICD-10-CM | POA: Diagnosis not present

## 2020-01-03 DIAGNOSIS — H35373 Puckering of macula, bilateral: Secondary | ICD-10-CM | POA: Diagnosis not present

## 2020-01-03 DIAGNOSIS — Z961 Presence of intraocular lens: Secondary | ICD-10-CM | POA: Diagnosis not present

## 2020-01-09 ENCOUNTER — Telehealth: Payer: Self-pay | Admitting: Family Medicine

## 2020-01-09 ENCOUNTER — Other Ambulatory Visit: Payer: Self-pay | Admitting: Family Medicine

## 2020-01-09 DIAGNOSIS — E1165 Type 2 diabetes mellitus with hyperglycemia: Secondary | ICD-10-CM

## 2020-01-09 MED ORDER — TRULICITY 0.75 MG/0.5ML ~~LOC~~ SOAJ: 0.7500 mg | SUBCUTANEOUS | 5 refills | Status: DC

## 2020-01-09 NOTE — Progress Notes (Signed)
Meds ordered this encounter  Medications   Dulaglutide (TRULICITY) 0.75 MG/0.5ML SOPN    Sig: Inject 0.75 mg into the skin once a week.    Dispense:  0.5 mL    Refill:  5    Order Specific Question:   Supervising Provider    Answer:   Quentin Angst [[id number]]    Nolon Nations  APRN, MSN, FNP-C Patient Care [facility] [address] [place], [name] [address] [phone]

## 2020-01-10 NOTE — Telephone Encounter (Signed)
Done

## 2020-01-19 ENCOUNTER — Other Ambulatory Visit (HOSPITAL_COMMUNITY): Payer: Self-pay | Admitting: Family Medicine

## 2020-01-19 DIAGNOSIS — E1165 Type 2 diabetes mellitus with hyperglycemia: Secondary | ICD-10-CM

## 2020-01-19 MED ORDER — ATORVASTATIN CALCIUM 20 MG PO TABS: 10.0000 mg | ORAL_TABLET | Freq: Every day | ORAL | 3 refills | Status: DC

## 2020-01-19 NOTE — Progress Notes (Signed)
Meds ordered this encounter  Medications  . atorvastatin (LIPITOR) 20 MG tablet    Sig: Take 0.5 tablets (10 mg total) by mouth daily.    Dispense:  90 tablet    Refill:  3    Order Specific Question:   Supervising Provider    Answer:   Quentin Angst [[id number]]     Nolon Nations  APRN, MSN, FNP-C Patient Care [facility] [address] [place], [name] [address] [phone]

## 2020-01-23 ENCOUNTER — Other Ambulatory Visit: Payer: Self-pay

## 2020-01-23 ENCOUNTER — Ambulatory Visit (INDEPENDENT_AMBULATORY_CARE_PROVIDER_SITE_OTHER): Payer: Medicare HMO | Admitting: Orthotics

## 2020-01-23 DIAGNOSIS — E1142 Type 2 diabetes mellitus with diabetic polyneuropathy: Secondary | ICD-10-CM

## 2020-01-23 DIAGNOSIS — L84 Corns and callosities: Secondary | ICD-10-CM

## 2020-01-23 DIAGNOSIS — Z89411 Acquired absence of right great toe: Secondary | ICD-10-CM

## 2020-01-23 DIAGNOSIS — R296 Repeated falls: Secondary | ICD-10-CM

## 2020-01-23 NOTE — Progress Notes (Signed)
Patient came in today to pick up diabetic shoes and custom inserts. 9RT L5000 toe filler) Same was well pleased with fit and function.   The patient could ambulate without any discomfort; there were no signs of any quality issues. The foot ortheses offered full contact with plantar surface and contoured the arch well.   The shoes fit well with no heel slippage and areas of pressure concern.   Patient advised to contact us if any problems arise.  Patient also advised on how to report any issues.

## 2020-02-27 ENCOUNTER — Ambulatory Visit (INDEPENDENT_AMBULATORY_CARE_PROVIDER_SITE_OTHER): Payer: Medicare HMO | Admitting: Family Medicine

## 2020-02-27 ENCOUNTER — Encounter: Payer: Self-pay | Admitting: Family Medicine

## 2020-02-27 ENCOUNTER — Other Ambulatory Visit: Payer: Self-pay

## 2020-02-27 VITALS — BP 165/68 | HR 76 | Temp 97.0°F | Ht 64.0 in | Wt 227.0 lb

## 2020-02-27 DIAGNOSIS — E785 Hyperlipidemia, unspecified: Secondary | ICD-10-CM | POA: Diagnosis not present

## 2020-02-27 DIAGNOSIS — R82998 Other abnormal findings in urine: Secondary | ICD-10-CM

## 2020-02-27 DIAGNOSIS — E1165 Type 2 diabetes mellitus with hyperglycemia: Secondary | ICD-10-CM

## 2020-02-27 DIAGNOSIS — K219 Gastro-esophageal reflux disease without esophagitis: Secondary | ICD-10-CM | POA: Diagnosis not present

## 2020-02-27 DIAGNOSIS — N1831 Chronic kidney disease, stage 3a: Secondary | ICD-10-CM

## 2020-02-27 DIAGNOSIS — I10 Essential (primary) hypertension: Secondary | ICD-10-CM

## 2020-02-27 DIAGNOSIS — E669 Obesity, unspecified: Secondary | ICD-10-CM | POA: Diagnosis not present

## 2020-02-27 LAB — POCT URINALYSIS DIPSTICK
Bilirubin, UA: NEGATIVE
Glucose, UA: POSITIVE — AB
Ketones, UA: NEGATIVE
Nitrite, UA: NEGATIVE
Protein, UA: POSITIVE — AB
Spec Grav, UA: 1.015 (ref 1.010–1.025)
Urobilinogen, UA: 0.2 E.U./dL
pH, UA: 5 (ref 5.0–8.0)

## 2020-02-27 LAB — POCT GLYCOSYLATED HEMOGLOBIN (HGB A1C): Hemoglobin A1C: 12.9 % — AB (ref 4.0–5.6)

## 2020-02-27 MED ORDER — TRULICITY 0.75 MG/0.5ML ~~LOC~~ SOAJ: 0.7500 mg | SUBCUTANEOUS | 11 refills | Status: DC

## 2020-02-27 MED ORDER — OMEPRAZOLE 20 MG PO CPDR: 20.0000 mg | DELAYED_RELEASE_CAPSULE | Freq: Every day | ORAL | 2 refills | Status: DC

## 2020-02-27 NOTE — Progress Notes (Signed)
Patient [facility]   Established Patient Office Visit  Subjective:  Patient ID: Rachel Gonzalez, female    DOB: [birth year]  Age: 69 y.o. MRN: [id number]  CC:  Chief Complaint  Patient presents with  . Diabetes    HPI  Rachel Gonzalez is a very pleasant 69 year old female with a medical history significant for uncontrolled type 2 diabetes mellitus on insulin, peripheral neuropathy, chronic kidney disease stage III, GERD, essential hypertension, osteoarthritis, and obesity presents for follow-up of chronic conditions.  Patient states that she has been doing well and is without complaint on today.  She says that she has not been following a low-fat, low carbohydrate diet or exercising.  However, she has been taking medications consistently over the past several months.  Diabetes She has type 2 diabetes mellitus. Pertinent negatives for hypoglycemia include no confusion, dizziness, headaches or sweats. Pertinent negatives for diabetes include no blurred vision, no chest pain, no fatigue, no foot paresthesias, no foot ulcerations, no polydipsia, no polyphagia and no polyuria. Risk factors for coronary artery disease include dyslipidemia, hypertension and obesity. She is compliant with treatment some of the time. When asked about meal planning, she reported none. She has not had a previous visit with a dietitian. She rarely participates in exercise. There is no change in her home blood glucose trend. An ACE inhibitor/angiotensin II receptor blocker is not being taken. She sees a podiatrist.Eye exam is current.  Hypertension This is a chronic problem. The problem is uncontrolled. Pertinent negatives include no blurred vision, chest pain, headaches, orthopnea, palpitations or sweats. Past treatments include beta blockers. The current treatment provides moderate improvement. Compliance problems include diet and exercise.  Hypertensive end-organ damage includes  kidney disease.     Past Medical History:  Diagnosis Date  . Anemia   . Arthritis    right finger  . Diabetes mellitus   . GERD (gastroesophageal reflux disease)   . Hypertension   . Renal disorder     Past Surgical History:  Procedure Laterality Date  . AMPUTATION Right 05/08/2014   Procedure: AMPUTATION RAY, right great toe;  Surgeon: Newt Minion, MD;  Location: [place];  Service: Orthopedics;  Laterality: Right;  . arm surgery     . CESAREAN SECTION    . COLONOSCOPY    . I & D EXTREMITY Right 05/08/2014   Procedure: IRRIGATION AND DEBRIDEMENT FOOT;  Surgeon: Newt Minion, MD;  Location: [facility];  Service: Orthopedics;  Laterality: Right;  . MEMBRANE PEEL Right 03/15/2018   Procedure: MEMBRANE PEEL;  Surgeon: Jalene Mullet, MD;  Location: [facility];  Service: Ophthalmology;  Laterality: Right;  . PHOTOCOAGULATION WITH LASER Right 03/15/2018   Procedure: PHOTOCOAGULATION WITH LASER;  Surgeon: Jalene Mullet, MD;  Location: [facility];  Service: Ophthalmology;  Laterality: Right;  . 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 Diabetes Mother     Social History   Socioeconomic History  . Marital status: Married    Spouse name: Not on file  . Number of children: Not on file  . Years of education: Not on file  . Highest education level: Not on file  Occupational History  . Not on file  Tobacco Use  .  Smoking status: Never Smoker  . Smokeless tobacco: Never Used  Vaping Use  . Vaping Use: Never used  Substance and Sexual Activity  . Alcohol use: No  . Drug use: No  . Sexual activity: Not on file  Other Topics Concern  . Not on file  Social History Narrative   From [place].   Married.   Three  children, 9 grandchildren.   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   Travels to [place], [facility]   Social Determinants of Health   Financial Resource Strain: Not on file  Food Insecurity: Not on file  Transportation Needs: Not on file  Physical Activity: Not on file  Stress: Not on file  Social Connections: Not on file  Intimate Partner Violence: Not on file    Outpatient Medications Prior to Visit  Medication Sig Dispense Refill  . acetaminophen (TYLENOL) 500 MG tablet Take 500-1,000 mg by mouth every 6 (six) hours as needed (pain.).    Marland Kitchen acetaminophen-codeine (TYLENOL #3) 300-30 MG tablet TAKE 1 TABLET BY MOUTH EVERY 6 HOURS AS NEEDED FOR MODERATE PAIN 30 tablet 0  . amLODipine (NORVASC) 10 MG tablet Take 1 tablet (10 mg total) by mouth daily. 90 tablet 3  . atorvastatin (LIPITOR) 20 MG tablet Take 0.5 tablets (10 mg total) by mouth daily. 90 tablet 3  . Blood Glucose Monitoring Suppl (ACCU-CHEK AVIVA PLUS) w/Device KIT 1 Device by Does not apply route once. Use as directed to check blood sugar 1 kit 0  . Blood Glucose Monitoring Suppl (ONE TOUCH ULTRA SYSTEM KIT) w/Device KIT Test three times daily. Dx Type 2 Diabetes. 1 each 0  . Dulaglutide (TRULICITY) 6.70 LI/1.0VU SOPN Inject 0.75 mg into the skin once a week. 0.5 mL 0  . Dulaglutide (TRULICITY) 1.31 YH/8.8IL SOPN Inject 0.75 mg into the skin once a week. 0.5 mL 5  . furosemide (LASIX) 20 MG tablet Take 1 tablet (20 mg total) by mouth daily. 30 tablet 11  . gabapentin (NEURONTIN) 300 MG capsule Take 1 capsule (300 mg total) by mouth 2 (two) times daily. 180 capsule 0  . hydrOXYzine (ATARAX/VISTARIL) 25 MG tablet Take 1-2 tablets (25-50 mg total) by mouth every 6 (six) hours as needed for itching. 20 tablet 0  . insulin glargine (LANTUS SOLOSTAR) 100 UNIT/ML Solostar Pen Inject 45 Units into the skin at bedtime. 15 mL PRN  . Lancet Devices (ACCU-CHEK SOFTCLIX) lancets Use as instructed  100 each 12  . meloxicam (MOBIC) 15 MG tablet Take 1 tablet (15 mg total) by mouth daily. X 2 wks (Patient not taking: Reported on 11/28/2019) 30 tablet 0  . mupirocin ointment (BACTROBAN) 2 % Apply topically 2 (two) times daily. 22 g 0  . nystatin (NYSTATIN) powder Apply topically 3 (three) times daily. 60 g 5  . nystatin ointment (MYCOSTATIN) APPLY 1 APPLICATION OF  OINTMENT TOPICALLY TWICE DAILY 30 g 0  . omeprazole (PRILOSEC) 20 MG capsule Take 1 capsule (20 mg total) by mouth daily. 30 capsule 1  . ONE TOUCH LANCETS MISC Test 3 times daily with glucometer. Dx Type 2 diabetes with unspecified complications. 200 each 5  . RELION PEN NEEDLE 31G/8MM 31G X 8 MM MISC USE AS DIRECTED 4 TIMES DAILY 100 each 11  . Triamcinolone Acetonide (TRIAMCINOLONE 0.1 % CREAM : EUCERIN) CREA Apply 1 application topically 3 (three) times daily as needed. 1 each 5   No facility-administered medications prior to visit.  Allergies  Allergen Reactions  . Tramadol Nausea And Vomiting  . Adhesive [Tape] Itching  . Bactrim [Sulfamethoxazole-Trimethoprim] Other (See Comments)    AKI and hyperkalemia   . Tylenol With Codeine #3 [Acetaminophen-Codeine] Diarrhea and Itching    ROS Review of Systems  Constitutional: Negative for activity change, appetite change and fatigue.  Eyes: Negative.  Negative for blurred vision.  Respiratory: Negative.   Cardiovascular: Negative.  Negative for chest pain, palpitations and orthopnea.  Gastrointestinal: Negative.   Endocrine: Negative.  Negative for polydipsia, polyphagia and polyuria.  Genitourinary: Negative.   Musculoskeletal: Negative.   Skin: Negative.   Neurological: Negative.  Negative for dizziness and headaches.  Psychiatric/Behavioral: Negative.  Negative for confusion.      Objective:    Physical Exam Constitutional:      Appearance: She is obese.  HENT:     Nose: Nose normal.  Eyes:     Pupils: Pupils are equal, round, and reactive to light.   Cardiovascular:     Rate and Rhythm: Normal rate and regular rhythm.     Pulses: Normal pulses.     Heart sounds: Normal heart sounds.  Pulmonary:     Effort: Pulmonary effort is normal.  Abdominal:     General: Abdomen is flat. Bowel sounds are normal.  Musculoskeletal:        General: Normal range of motion.  Skin:    General: Skin is warm.  Neurological:     General: No focal deficit present.     Mental Status: She is alert. Mental status is at baseline.  Psychiatric:        Mood and Affect: Mood normal.        Behavior: Behavior normal.        Thought Content: Thought content normal.        Judgment: Judgment normal.     BP (!) 165/68   Pulse 76   Temp (!) 97 F (36.1 C) (Temporal)   Ht 5' 4"  (1.626 m)   Wt 227 lb (103 kg)   SpO2 98%   BMI 38.96 kg/m  Wt Readings from Last 3 Encounters:  02/27/20 227 lb (103 kg)  11/28/19 232 lb (105.2 kg)  08/29/19 225 lb 6.4 oz (102.2 kg)     Health Maintenance Due  Topic Date Due  . COLONOSCOPY  Never done  . DEXA SCAN  Never done  . MAMMOGRAM  04/22/2017  . COVID-19 Vaccine (3 - Booster for Pfizer series) 12/07/2019  . OPHTHALMOLOGY EXAM  01/06/2020  . HEMOGLOBIN A1C  02/27/2020    There are no preventive care reminders to display for this patient.  Lab Results  Component Value Date   TSH 2.025 02/12/2015   Lab Results  Component Value Date   WBC 6.0 06/26/2019   HGB 10.0 (L) 06/26/2019   HCT 32.7 (L) 06/26/2019   MCV 84 06/26/2019   PLT 268 06/26/2019   Lab Results  Component Value Date   NA 141 11/28/2019   K 4.9 11/28/2019   CO2 25 11/28/2019   GLUCOSE 204 (H) 11/28/2019   BUN 31 (H) 11/28/2019   CREATININE 1.55 (H) 11/28/2019   BILITOT 0.2 11/28/2019   ALKPHOS 194 (H) 11/28/2019   AST 9 11/28/2019   ALT 10 11/28/2019   PROT 6.4 11/28/2019   ALBUMIN 3.8 11/28/2019   CALCIUM 9.5 11/28/2019   ANIONGAP 9 06/01/2019   GFR 63.19 11/07/2014   Lab Results  Component Value Date   CHOL 156  06/26/2019  Lab Results  Component Value Date   HDL 78 06/26/2019   Lab Results  Component Value Date   LDLCALC 68 06/26/2019   Lab Results  Component Value Date   TRIG 43 06/26/2019   Lab Results  Component Value Date   CHOLHDL 2.0 06/26/2019   Lab Results  Component Value Date   HGBA1C 14.0 (A) 11/28/2019      Assessment & Plan:   Problem List Items Addressed This Visit      Cardiovascular and Mediastinum   Essential hypertension     Endocrine   Uncontrolled type 2 diabetes mellitus with hyperglycemia ([place]) - Primary   Relevant Medications   Dulaglutide (TRULICITY) 2.83 TD/1.7OH SOPN   Other Relevant Orders   HgB A1c (Completed)   Urinalysis Dipstick (Completed)   Comprehensive metabolic panel   CBC with Differential   Diabetic neuropathy, type II diabetes mellitus (HCC) (Chronic)   Relevant Medications   Dulaglutide (TRULICITY) 6.07 PX/1.0GY SOPN     Genitourinary   Stage 3 chronic kidney disease (HCC)     Other   Obesity (BMI 30-39.9) (Chronic)   Relevant Medications   Dulaglutide (TRULICITY) 6.94 WN/4.6EV SOPN    Other Visit Diagnoses    Hyperlipidemia, unspecified hyperlipidemia type       Gastroesophageal reflux disease without esophagitis       Relevant Medications   omeprazole (PRILOSEC) 20 MG capsule   Urine leukocytes       Relevant Orders   Urine Culture     1. Uncontrolled type 2 diabetes mellitus with hyperglycemia (HCC) - HgB A1c - Urinalysis Dipstick - Comprehensive metabolic panel - CBC with Differential - Dulaglutide (TRULICITY) 0.35 KK/9.3GH SOPN; Inject 0.75 mg into the skin once a week.  Dispense: 0.5 mL; Refill: 11  2. Essential hypertension BP (!) 165/68   Pulse 76   Temp (!) 97 F (36.1 C) (Temporal)   Ht 5' 4"  (1.626 m)   Wt 227 lb (103 kg)   SpO2 98%   BMI 38.96 kg/m  - Continue medication, monitor blood pressure at home. Continue DASH diet. Reminder to go to the ER if any CP, SOB, nausea, dizziness, severe HA,  changes vision/speech, left arm numbness and tingling and jaw pain.     3. Stage 3a chronic kidney disease (HCC) Review CMP as results become available  4. Hyperlipidemia, unspecified hyperlipidemia type The 10-year ASCVD risk score Mikey Bussing DC Brooke Bonito., et al., 2013) is: 30.8%   Values used to calculate the score:     Age: 58 years     Sex: Female     Is Non-Hispanic African American: Yes     Diabetic: Yes     Tobacco smoker: No     Systolic Blood Pressure: 829 mmHg     Is BP treated: Yes     HDL Cholesterol: 78 mg/dL     Total Cholesterol: 156 mg/dL   5. Gastroesophageal reflux disease without esophagitis - omeprazole (PRILOSEC) 20 MG capsule; Take 1 capsule (20 mg total) by mouth daily.  Dispense: 90 capsule; Refill: 2  6. Obesity (BMI 30-39.9) The patient is asked to make an attempt to improve diet and exercise patterns to aid in medical management of this problem.   7. Urine leukocytes - Urine Culture    Follow-up: Return in about 3 months (around 05/27/2020).    Donia Pounds  APRN, MSN, FNP-C Patient [facility] [address] [place], [place] [address] [phone]

## 2020-02-28 LAB — CBC WITH DIFFERENTIAL/PLATELET
Basophils Absolute: 0.1 10*3/uL (ref 0.0–0.2)
Basos: 1 %
EOS (ABSOLUTE): 0.2 10*3/uL (ref 0.0–0.4)
Eos: 3 %
Hematocrit: 33.9 % — ABNORMAL LOW (ref 34.0–46.6)
Hemoglobin: 11 g/dL — ABNORMAL LOW (ref 11.1–15.9)
Immature Grans (Abs): 0 10*3/uL (ref 0.0–0.1)
Immature Granulocytes: 0 %
Lymphocytes Absolute: 2.2 10*3/uL (ref 0.7–3.1)
Lymphs: 37 %
MCH: 27.2 pg (ref 26.6–33.0)
MCHC: 32.4 g/dL (ref 31.5–35.7)
MCV: 84 fL (ref 79–97)
Monocytes Absolute: 0.4 10*3/uL (ref 0.1–0.9)
Monocytes: 7 %
Neutrophils Absolute: 3 10*3/uL (ref 1.4–7.0)
Neutrophils: 52 %
Platelets: 211 10*3/uL (ref 150–450)
RBC: 4.05 x10E6/uL (ref 3.77–5.28)
RDW: 11.8 % (ref 11.7–15.4)
WBC: 5.9 10*3/uL (ref 3.4–10.8)

## 2020-02-28 LAB — COMPREHENSIVE METABOLIC PANEL
ALT: 6 IU/L (ref 0–32)
AST: 9 IU/L (ref 0–40)
Albumin/Globulin Ratio: 1.6 (ref 1.2–2.2)
Albumin: 4.1 g/dL (ref 3.8–4.8)
Alkaline Phosphatase: 200 IU/L — ABNORMAL HIGH (ref 44–121)
BUN/Creatinine Ratio: 15 (ref 12–28)
BUN: 27 mg/dL (ref 8–27)
Bilirubin Total: 0.2 mg/dL (ref 0.0–1.2)
CO2: 26 mmol/L (ref 20–29)
Calcium: 9.4 mg/dL (ref 8.7–10.3)
Chloride: 104 mmol/L (ref 96–106)
Creatinine, Ser: 1.75 mg/dL — ABNORMAL HIGH (ref 0.57–1.00)
GFR calc Af Amer: 34 mL/min/{1.73_m2} — ABNORMAL LOW (ref >59)
GFR calc non Af Amer: 29 mL/min/{1.73_m2} — ABNORMAL LOW (ref >59)
Globulin, Total: 2.5 g/dL (ref 1.5–4.5)
Glucose: 126 mg/dL — ABNORMAL HIGH (ref 65–99)
Potassium: 4.5 mmol/L (ref 3.5–5.2)
Sodium: 141 mmol/L (ref 134–144)
Total Protein: 6.6 g/dL (ref 6.0–8.5)

## 2020-02-29 ENCOUNTER — Telehealth: Payer: Self-pay

## 2020-02-29 LAB — URINE CULTURE

## 2020-02-29 NOTE — Telephone Encounter (Signed)
Called and spoke with patient regarding her labs , discuss eating a low carb diet ,and taking her DM medication,also watching her sodium intake. Pt understood results,reminded pt follow up at her next appt in March.

## 2020-02-29 NOTE — Telephone Encounter (Signed)
-----   Message from Massie Maroon, [place] sent at 02/29/2020 11:03 AM EST ----- Regarding: lab results Please inform patient that hemoglobin A1c is greater than 12.  Discussed the importance of taking antidiabetic medications exactly as prescribed in order to achieve positive outcomes.  Also, recommend a carbohydrate modified diet divided over small meals throughout the day.  Refrain from eating high carbohydrate index foods including pasta, rice, grits, candy, cookies, cakes, pies, etc.  Also, concentrated juices and soda.  Creatinine, an indicator of kidney function continues to be elevated but is consistent with her baseline.  Also, blood pressure was elevated in office.  Decrease daily sodium intake. Follow-up in clinic as scheduled.   Nolon Nations  APRN, MSN, FNP-C Patient Care [facility] [address] [place], [name] [address] [phone]

## 2020-03-14 ENCOUNTER — Other Ambulatory Visit: Payer: Self-pay | Admitting: Family Medicine

## 2020-03-14 DIAGNOSIS — Z13820 Encounter for screening for osteoporosis: Secondary | ICD-10-CM

## 2020-03-14 DIAGNOSIS — Z9189 Other specified personal risk factors, not elsewhere classified: Secondary | ICD-10-CM

## 2020-03-18 ENCOUNTER — Ambulatory Visit: Payer: Medicare HMO

## 2020-03-18 ENCOUNTER — Other Ambulatory Visit: Payer: Medicare HMO

## 2020-03-19 ENCOUNTER — Ambulatory Visit: Payer: Medicare HMO | Admitting: Podiatry

## 2020-04-07 ENCOUNTER — Ambulatory Visit (INDEPENDENT_AMBULATORY_CARE_PROVIDER_SITE_OTHER): Payer: Medicare HMO

## 2020-04-07 ENCOUNTER — Ambulatory Visit (HOSPITAL_COMMUNITY)
Admission: EM | Admit: 2020-04-07 | Discharge: 2020-04-07 | Disposition: A | Discharge status: Home / Self Care | Payer: Medicare HMO | Attending: Student | Admitting: Student

## 2020-04-07 ENCOUNTER — Encounter (HOSPITAL_COMMUNITY): Payer: Self-pay

## 2020-04-07 ENCOUNTER — Other Ambulatory Visit: Payer: Self-pay

## 2020-04-07 DIAGNOSIS — W19XXXA Unspecified fall, initial encounter: Secondary | ICD-10-CM | POA: Diagnosis not present

## 2020-04-07 DIAGNOSIS — R0781 Pleurodynia: Secondary | ICD-10-CM | POA: Diagnosis not present

## 2020-04-07 DIAGNOSIS — M79661 Pain in right lower leg: Secondary | ICD-10-CM

## 2020-04-07 DIAGNOSIS — R110 Nausea: Secondary | ICD-10-CM

## 2020-04-07 DIAGNOSIS — S2241XA Multiple fractures of ribs, right side, initial encounter for closed fracture: Secondary | ICD-10-CM

## 2020-04-07 DIAGNOSIS — L853 Xerosis cutis: Secondary | ICD-10-CM

## 2020-04-07 DIAGNOSIS — J069 Acute upper respiratory infection, unspecified: Secondary | ICD-10-CM

## 2020-04-07 DIAGNOSIS — S80811A Abrasion, right lower leg, initial encounter: Secondary | ICD-10-CM | POA: Diagnosis not present

## 2020-04-07 DIAGNOSIS — J302 Other seasonal allergic rhinitis: Secondary | ICD-10-CM

## 2020-04-07 MED ORDER — ONDANSETRON HCL 8 MG PO TABS: 8.0000 mg | ORAL_TABLET | Freq: Three times a day (TID) | ORAL | 0 refills | Status: DC | PRN

## 2020-04-07 MED ORDER — TRIAMCINOLONE ACETONIDE 0.1 % EX CREA: 1.0000 "application " | TOPICAL_CREAM | Freq: Two times a day (BID) | CUTANEOUS | 0 refills | Status: DC

## 2020-04-07 MED ORDER — DICLOFENAC SODIUM 75 MG PO TBEC: 75.0000 mg | DELAYED_RELEASE_TABLET | Freq: Two times a day (BID) | ORAL | 0 refills | Status: DC

## 2020-04-07 MED ORDER — TRIPLE ANTIBIOTIC 5-400-5000 EX OINT: TOPICAL_OINTMENT | Freq: Two times a day (BID) | CUTANEOUS | 0 refills | Status: DC

## 2020-04-07 MED ORDER — CETIRIZINE HCL 10 MG PO TABS: 10.0000 mg | ORAL_TABLET | Freq: Every day | ORAL | 0 refills | Status: DC

## 2020-04-07 NOTE — Discharge Instructions (Addendum)
-  For your dry skin, try applying the triamcinolone cream 1-2x daily. You can also apply a thick moisturizing cream.  -I also sent an antibacterial cream for your leg wound. Apply this 1-2x daily. Also wash the wound with gentle soap and water once daily. Come back and see [place] if the area appears infected; you develop new fevers/chills; etc. -Try Zyrtec for your allergies. Prescription sent. Try taking one pill daily. You can continue this if it helps your symptoms.  -For nausea, zofran up to 3x daily. -For pain, voltaren, up to two pills daily with food. Make sure to not take ibuprofen, meloxicam, or naproxen while taking this medication.   Follow-up with your primary care later this week to make sure your ribs are healing well.  Seek immediate medical attention if you experience left-sided chest pain, shortness of breath, new/worsening abdominal pain, etc.

## 2020-04-07 NOTE — ED Triage Notes (Signed)
Pt present loss of appetite, body aches and headache with a rash on her right upper thigh. Symptom started two weeks ago. Pt states the rash is constantly itching and will not stop .

## 2020-04-07 NOTE — ED Provider Notes (Signed)
[place]    CSN: [id number] Arrival date & time: 04/07/20  1314      History   Chief Complaint Chief Complaint  Patient presents with  . Rash    Right upper thigh   . Headache    HPI Rachel Gonzalez is a 70 y.o. female presenting for multiple complaints. History arthritis, diabetes mellitus, GERD, hypertension, stage 3 kidney disease, diabetic neuropathy. -Patient recently fell. States she tripped on the sidewalk and fell, scraping right shin. Endorses right-sided lower rib pain since then. Endorses some back pain with movement. Denies other injuries. Denies hitting head, LOC. Denies dizziness before fall. Abrasion R shin, healing on its own but tender.  -Also notes 2 weeks of URI symptoms- endorses cough, decreased appetite, generalized body aches, nausea without vomiting. Symptoms are improving on their own.  -Also notes rash on R thigh x2 weeks. Describes this as very itchy. Tried moisturizer without improvement, and "yeast cream" made symptoms worse. -Also notes seasonal allergies, which have been exacerbated. Not taking anything for this.    HPI  Past Medical History:  Diagnosis Date  . Anemia   . Arthritis    right finger  . Diabetes mellitus   . GERD (gastroesophageal reflux disease)   . Hypertension   . Renal disorder     Patient Active Problem List   Diagnosis Date Noted  . Acute kidney injury superimposed on chronic kidney disease ([place]) 05/30/2019  . Stage 3 chronic kidney disease ([place]) 12/01/2016  . Frequent falls 04/28/2016  . Right arm weakness 04/28/2016  . Bilateral lower extremity edema 04/28/2016  . Screening for colon cancer 04/28/2016  . Abnormal urinalysis 09/13/2014  . Chest pain 09/13/2014  . Abdominal pain, recurrent 09/13/2014  . Dehydration with hyponatremia 09/13/2014  . Acute hyperkalemia 09/13/2014  . Acute renal failure superimposed on stage 3 chronic kidney disease ([place]) 09/13/2014  . Anemia 05/06/2014  . Diabetic  neuropathy, type II diabetes mellitus ([place]) 05/06/2014  . Obesity (BMI 30-39.9) 05/06/2014  . Cellulitis of right foot 05/06/2014  . Essential hypertension 03/07/2011  . 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 Right arm pain 08/04/2006  . Osteoarthritis 02/14/2006  . Uncontrolled type 2 diabetes mellitus with hyperglycemia ([place]) 01/11/2006    Past Surgical History:  Procedure Laterality Date  . AMPUTATION Right 05/08/2014   Procedure: AMPUTATION RAY, right great toe;  Surgeon: Newt Minion, MD;  Location: [facility];  Service: Orthopedics;  Laterality: Right;  . arm surgery     . CESAREAN SECTION    . COLONOSCOPY    . I & D EXTREMITY Right 05/08/2014   Procedure: IRRIGATION AND DEBRIDEMENT FOOT;  Surgeon: Newt Minion, MD;  Location: [facility];  Service: Orthopedics;  Laterality: Right;  . MEMBRANE PEEL Right 03/15/2018   Procedure: MEMBRANE PEEL;  Surgeon: Jalene Mullet, MD;  Location: [facility];  Service: Ophthalmology;  Laterality: Right;  . PHOTOCOAGULATION WITH LASER Right 03/15/2018   Procedure: PHOTOCOAGULATION WITH LASER;  Surgeon: Jalene Mullet, MD;  Location: [facility];  Service: Ophthalmology;  Laterality: Right;  . 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 Authorizing Provider  cetirizine (ZYRTEC ALLERGY) 10 MG tablet Take 1 tablet (10 mg total) by mouth daily. 04/07/20  Yes Hazel Sams, PA-C  diclofenac (VOLTAREN) 75 MG EC tablet Take 1 tablet (75 mg total) by  mouth 2 (two) times daily. 04/07/20  Yes Hazel Sams, PA-C  neomycin-bacitracin-polymyxin (NEOSPORIN) [phone] ointment Apply topically in the morning and at bedtime. 04/07/20  Yes Hazel Sams, PA-C  ondansetron (ZOFRAN) 8 MG tablet Take 1 tablet (8 mg total) by mouth every 8 (eight) hours as needed for nausea or vomiting. 04/07/20  Yes Hazel Sams, PA-C  triamcinolone (KENALOG) 0.1 % Apply 1 application topically 2 (two) times daily. 04/07/20  Yes Hazel Sams, PA-C  acetaminophen (TYLENOL) 500 MG tablet Take 500-1,000 mg by mouth every 6 (six) hours as needed (pain.).    [provider]  acetaminophen-codeine (TYLENOL #3) 300-30 MG tablet TAKE 1 TABLET BY MOUTH EVERY 6 HOURS AS NEEDED FOR MODERATE PAIN 05/23/19   Dorena Dew, FNP  amLODipine (NORVASC) 10 MG tablet Take 1 tablet (10 mg total) by mouth daily. 05/23/19   Dorena Dew, FNP  atorvastatin (LIPITOR) 20 MG tablet Take 0.5 tablets (10 mg total) by mouth daily. 01/19/20   Dorena Dew, FNP  Blood Glucose Monitoring Suppl (ACCU-CHEK AVIVA PLUS) w/Device KIT 1 Device by Does not apply route once. Use as directed to check blood sugar 06/11/15   Dorena Dew, FNP  Blood Glucose Monitoring Suppl (ONE TOUCH ULTRA SYSTEM KIT) w/Device KIT Test three times daily. Dx Type 2 Diabetes. 06/10/15   Dorena Dew, FNP  Dulaglutide (TRULICITY) 3.79 KW/4.0XB SOPN Inject 0.75 mg into the skin once a week. 02/27/20   Dorena Dew, FNP  furosemide (LASIX) 20 MG tablet Take 1 tablet (20 mg total) by mouth daily. 09/01/19 08/31/20  Dorena Dew, FNP  gabapentin (NEURONTIN) 300 MG capsule Take 1 capsule (300 mg total) by mouth 2 (two) times daily. 11/16/19   Dorena Dew, FNP  hydrOXYzine (ATARAX/VISTARIL) 25 MG tablet Take 1-2 tablets (25-50 mg total) by mouth every 6 (six) hours as needed for itching. 11/06/19   Raylene Everts, MD  insulin glargine (LANTUS SOLOSTAR) 100 UNIT/ML Solostar Pen Inject 45 Units  into the skin at bedtime. 11/30/19   Dorena Dew, FNP  Lancet Devices [facility]) lancets Use as instructed 06/11/15   Dorena Dew, FNP  meloxicam (MOBIC) 15 MG tablet Take 1 tablet (15 mg total) by mouth daily. X 2 wks Patient not taking: Reported on 11/28/2019 06/05/19   Meredith Pel, MD  mupirocin ointment (BACTROBAN) 2 % Apply topically 2 (two) times daily. 12/17/19   Volney American, PA-C  nystatin (NYSTATIN) powder Apply topically 3 (three) times daily. 11/28/19   Dorena Dew, FNP  nystatin ointment (MYCOSTATIN) APPLY 1 APPLICATION OF  OINTMENT TOPICALLY TWICE DAILY 11/29/19   Dorena Dew, FNP  omeprazole (PRILOSEC) 20 MG capsule Take 1 capsule (20 mg total) by mouth daily. 02/27/20   Dorena Dew, FNP  ONE TOUCH LANCETS  MISC Test 3 times daily with glucometer. Dx Type 2 diabetes with unspecified complications. 06/10/15   Dorena Dew, FNP  RELION PEN NEEDLE 31G/8MM 31G X 8 MM MISC USE AS DIRECTED 4 TIMES DAILY 12/20/17   Lanae Boast, FNP  Triamcinolone Acetonide (TRIAMCINOLONE 0.1 % CREAM : EUCERIN) CREA Apply 1 application topically 3 (three) times daily as needed. 05/23/19   Dorena Dew, FNP    Family History Family History  Problem Relation Age of Onset  . Diabetes Mother     Social History Social History   Tobacco Use  . Smoking status: Never Smoker  . Smokeless tobacco: Never Used  Vaping Use  . Vaping Use: Never used  Substance Use Topics  . Alcohol use: No  . Drug use: No     Allergies   Tramadol, Adhesive [tape], Bactrim [sulfamethoxazole-trimethoprim], and Tylenol with codeine #3 [acetaminophen-codeine]   Review of Systems Review of Systems  Constitutional: Positive for appetite change. Negative for chills, fever and unexpected weight change.  HENT: Positive for congestion. Negative for ear pain, rhinorrhea, sinus pressure, sinus pain and sore throat.   Eyes: Negative for redness and visual disturbance.   Respiratory: Negative for cough, chest tightness, shortness of breath and wheezing.   Cardiovascular: Negative for chest pain and palpitations.  Gastrointestinal: Negative for abdominal pain, constipation, diarrhea, nausea and vomiting.  Genitourinary: Negative for decreased urine volume, difficulty urinating, dysuria, frequency and urgency.  Musculoskeletal: Positive for back pain. Negative for arthralgias, gait problem, joint swelling, myalgias, neck pain and neck stiffness.  Skin: Positive for wound.  Neurological: Negative for dizziness, tremors, seizures, syncope, facial asymmetry, speech difficulty, weakness, light-headedness, numbness and headaches.  Psychiatric/Behavioral: Negative for confusion.  All other systems reviewed and are negative.    Physical Exam Triage Vital Signs ED Triage Vitals  Enc Vitals Group     BP 04/07/20 1445 126/66     Pulse Rate 04/07/20 1445 86     Resp 04/07/20 1445 16     Temp 04/07/20 1445 99.1 F (37.3 C)     Temp Source 04/07/20 1445 Oral     SpO2 04/07/20 1445 100 %     Weight --      Height --      Head Circumference --      Peak Flow --      Pain Score 04/07/20 1446 9     Pain Loc --      Pain Edu? --      Excl. in [place]? --    No data found.  Updated Vital Signs BP 126/66 (BP Location: Right Arm)   Pulse 86   Temp 99.1 F (37.3 C) (Oral)   Resp 16   SpO2 100%   Visual Acuity Right Eye Distance:   Left Eye Distance:   Bilateral Distance:    Right Eye Near:   Left Eye Near:    Bilateral Near:     Physical Exam Vitals reviewed.  Constitutional:      General: She is not in acute distress.    Appearance: Normal appearance. She is not ill-appearing.  HENT:     Head: Normocephalic and atraumatic. No abrasion, contusion or laceration.     Right Ear: Hearing, tympanic membrane, ear canal and external ear normal. No swelling or tenderness. There is no impacted cerumen. No mastoid tenderness. Tympanic membrane is not perforated,  erythematous, retracted or bulging.     Left Ear: Hearing, tympanic membrane, ear canal and external ear normal. No  swelling or tenderness. There is no impacted cerumen. No mastoid tenderness. Tympanic membrane is not perforated, erythematous, retracted or bulging.     Nose: No signs of injury.     Right Sinus: No maxillary sinus tenderness or frontal sinus tenderness.     Left Sinus: No maxillary sinus tenderness or frontal sinus tenderness.     Mouth/Throat:     Mouth: Mucous membranes are moist.     Pharynx: Uvula midline. No oropharyngeal exudate or posterior oropharyngeal erythema.     Tonsils: No tonsillar exudate.  Eyes:     Extraocular Movements: Extraocular movements intact.     Pupils: Pupils are equal, round, and reactive to light.  Cardiovascular:     Rate and Rhythm: Normal rate and regular rhythm.     Heart sounds: Normal heart sounds.  Pulmonary:     Effort: Pulmonary effort is normal.     Breath sounds: Normal breath sounds and air entry. No wheezing, rhonchi or rales.  Chest:     Chest wall: No tenderness.  Abdominal:     General: Abdomen is flat. Bowel sounds are normal. There is no distension.     Palpations: Abdomen is soft. There is no mass.     Tenderness: There is no abdominal tenderness. There is no right CVA tenderness, left CVA tenderness, guarding or rebound.     Comments: Bowel sounds positive in all 4 quadrants. No tenderness to palpation. Negative Murphy Sign, Rovsing's sign, McBurney point tenderness.   Musculoskeletal:     Cervical back: Normal, full passive range of motion without pain, normal range of motion and neck supple. No swelling, edema, deformity, erythema, signs of trauma, lacerations, rigidity, spasms, torticollis, tenderness, bony tenderness or crepitus. No pain with movement. Normal range of motion.     Thoracic back: Spasms, tenderness and bony tenderness present. No swelling, edema, deformity, signs of trauma or lacerations. Normal range of  motion. No scoliosis.     Lumbar back: Normal. No swelling, edema, deformity, signs of trauma, lacerations, spasms, tenderness or bony tenderness. Normal range of motion. Negative right straight leg raise test and negative left straight leg raise test. No scoliosis.       Legs:     Comments: R anterior shoulder mild tenderness to deep palpation. ROM intact but with discomfort. Negative empty beer can, cross body adduction test.   R posterior distal ribs with significant tenderness to palpation. No bony deformity, ecchymosis, abrasion.  Lymphadenopathy:     Cervical: No cervical adenopathy.  Skin:    Findings: No bruising.     Comments: R shin with 1cm x3cm abrasion, healing well on its own. Significant tenderness to palpation of shin beneath wound.   R anterolateral thigh with diffusely dry skin.   Neurological:     General: No focal deficit present.     Mental Status: She is alert and oriented to person, place, and time. Mental status is at baseline.     Cranial Nerves: Cranial nerves are intact. No cranial nerve deficit or facial asymmetry.     Sensory: Sensation is intact. No sensory deficit.     Motor: Motor function is intact. No weakness.     Coordination: Coordination is intact. Coordination normal.     Gait: Gait is intact. Gait normal.     Comments: CN 2-12 grossly intact  Psychiatric:        Attention and Perception: Attention and perception normal.        Mood and Affect: Mood and affect  normal.        Behavior: Behavior normal. Behavior is cooperative.        Thought Content: Thought content normal.        Judgment: Judgment normal.      UC Treatments / Results  Labs (all labs ordered are listed, but only abnormal results are displayed) Labs Reviewed - No data to display  EKG   Radiology DG Ribs Unilateral W/Chest Right  Result Date: 04/07/2020 CLINICAL DATA:  Right-sided rib pain. EXAM: RIGHT RIBS AND CHEST - 3+ VIEW COMPARISON:  February 13, 2019 FINDINGS:  Findings are suspicious for nondisplaced fractures involving the posterior ninth and eighth ribs on the right. There is no pneumothorax. The lungs are clear. The heart size is normal. There is no focal infiltrate or large pleural effusion. IMPRESSION: Findings suspicious for nondisplaced fractures involving the posterior right ninth and eighth ribs. No pneumothorax. Electronically Signed   By: Constance Holster M.D.   On: 04/07/2020 15:43   DG Tibia/Fibula Right  Result Date: 04/07/2020 CLINICAL DATA:  Pain EXAM: RIGHT TIBIA AND FIBULA - 2 VIEW COMPARISON:  None. FINDINGS: There is no evidence of fracture or other focal bone lesions. Soft tissues are unremarkable. IMPRESSION: Negative. Electronically Signed   By: Constance Holster M.D.   On: 04/07/2020 15:44    Procedures Procedures (including critical care time)  Medications Ordered in UC Medications - No data to display  Initial Impression / Assessment and Plan / UC Course  I have reviewed the triage vital signs and the nursing notes.  Pertinent labs & imaging results that were available during my care of the patient were reviewed by me and considered in my medical decision making (see chart for details).     Declines covid test. Patient has had symptoms >2 weeks and so I am in agreement with this plan.  afebrile nontachycardic nontachypneic, oxygenating well on room air.   For allergic rhinitis- zyrtec, flonase as below.   Triple antibiotic cream for skin abrasion. Return precautions- area appears infected; you develop new fevers/chills; etc.  For dry skin- triamcinolone cream as below.   Pt with recent fall, presenting today with pain of multiple sites.  -Xray right tibia/fibula negative.  -Xray right ribs- Findings suspicious for nondisplaced fractures involving the posterior right ninth and eighth ribs. No pneumothorax.   Voltaren for pain. Pt with multiple medication allergies, including Vicodin and percocet. Rest, ice. She  will follow-up with PCP in 4-5 days for recheck.  Spent over 60 minutes obtaining H&P for multiple complaints, performing physical, discussing results, interpreting multiple films, discussing treatment plan and plan for follow-up with patient. Patient agrees with plan.    Final Clinical Impressions(s) / UC Diagnoses   Final diagnoses:  Acute upper respiratory infection  Dry skin dermatitis  Abrasion of right lower extremity, initial encounter  Fall, initial encounter  Seasonal allergic rhinitis, unspecified trigger  Closed fracture of multiple ribs of right side, initial encounter  Nausea without vomiting     Discharge Instructions     -For your dry skin, try applying the triamcinolone cream 1-2x daily. You can also apply a thick moisturizing cream.  -I also sent an antibacterial cream for your leg wound. Apply this 1-2x daily. Also wash the wound with gentle soap and water once daily. Come back and see [place] if the area appears infected; you develop new fevers/chills; etc. -Try Zyrtec for your allergies. Prescription sent. Try taking one pill daily. You can continue this if it helps  your symptoms.  -For nausea, zofran up to 3x daily. -For pain, voltaren, up to two pills daily with food. Make sure to not take ibuprofen, meloxicam, or naproxen while taking this medication.   Follow-up with your primary care later this week to make sure your ribs are healing well.  Seek immediate medical attention if you experience left-sided chest pain, shortness of breath, new/worsening abdominal pain, etc.     ED Prescriptions    Medication Sig Dispense Auth. Provider   cetirizine (ZYRTEC ALLERGY) 10 MG tablet Take 1 tablet (10 mg total) by mouth daily. 30 tablet Hazel Sams, PA-C   triamcinolone (KENALOG) 0.1 % Apply 1 application topically 2 (two) times daily. 30 g Hazel Sams, PA-C   neomycin-bacitracin-polymyxin (NEOSPORIN) [phone] ointment Apply topically in the morning and at  bedtime. 50 g Marin Roberts E, PA-C   ondansetron (ZOFRAN) 8 MG tablet Take 1 tablet (8 mg total) by mouth every 8 (eight) hours as needed for nausea or vomiting. 20 tablet Hazel Sams, PA-C   diclofenac (VOLTAREN) 75 MG EC tablet Take 1 tablet (75 mg total) by mouth 2 (two) times daily. 21 tablet Hazel Sams, PA-C     I have reviewed the PDMP during this encounter.   Hazel Sams, PA-C 04/07/20 1626

## 2020-04-10 ENCOUNTER — Inpatient Hospital Stay (HOSPITAL_COMMUNITY)
Admission: EM | Admit: 2020-04-10 | Discharge: 2020-04-16 | DRG: 637 | Disposition: A | Discharge status: Skilled Nursing Facility | Payer: Medicare HMO | Attending: Family Medicine | Admitting: Family Medicine

## 2020-04-10 ENCOUNTER — Encounter (HOSPITAL_COMMUNITY): Payer: Self-pay | Admitting: Nephrology

## 2020-04-10 ENCOUNTER — Other Ambulatory Visit: Payer: Self-pay

## 2020-04-10 ENCOUNTER — Emergency Department (HOSPITAL_COMMUNITY): Payer: Medicare HMO

## 2020-04-10 DIAGNOSIS — I129 Hypertensive chronic kidney disease with stage 1 through stage 4 chronic kidney disease, or unspecified chronic kidney disease: Secondary | ICD-10-CM | POA: Diagnosis present

## 2020-04-10 DIAGNOSIS — R29810 Facial weakness: Secondary | ICD-10-CM | POA: Diagnosis not present

## 2020-04-10 DIAGNOSIS — Z89411 Acquired absence of right great toe: Secondary | ICD-10-CM

## 2020-04-10 DIAGNOSIS — Z888 Allergy status to other drugs, medicaments and biological substances status: Secondary | ICD-10-CM

## 2020-04-10 DIAGNOSIS — R4182 Altered mental status, unspecified: Secondary | ICD-10-CM | POA: Diagnosis not present

## 2020-04-10 DIAGNOSIS — K219 Gastro-esophageal reflux disease without esophagitis: Secondary | ICD-10-CM | POA: Diagnosis present

## 2020-04-10 DIAGNOSIS — M6281 Muscle weakness (generalized): Secondary | ICD-10-CM | POA: Diagnosis not present

## 2020-04-10 DIAGNOSIS — W010XXA Fall on same level from slipping, tripping and stumbling without subsequent striking against object, initial encounter: Secondary | ICD-10-CM | POA: Diagnosis present

## 2020-04-10 DIAGNOSIS — N1832 Chronic kidney disease, stage 3b: Secondary | ICD-10-CM | POA: Diagnosis not present

## 2020-04-10 DIAGNOSIS — R410 Disorientation, unspecified: Secondary | ICD-10-CM | POA: Diagnosis not present

## 2020-04-10 DIAGNOSIS — Z833 Family history of diabetes mellitus: Secondary | ICD-10-CM

## 2020-04-10 DIAGNOSIS — E1111 Type 2 diabetes mellitus with ketoacidosis with coma: Secondary | ICD-10-CM | POA: Diagnosis not present

## 2020-04-10 DIAGNOSIS — Z885 Allergy status to narcotic agent status: Secondary | ICD-10-CM | POA: Diagnosis not present

## 2020-04-10 DIAGNOSIS — E1110 Type 2 diabetes mellitus with ketoacidosis without coma: Secondary | ICD-10-CM | POA: Diagnosis not present

## 2020-04-10 DIAGNOSIS — Y92009 Unspecified place in unspecified non-institutional (private) residence as the place of occurrence of the external cause: Secondary | ICD-10-CM

## 2020-04-10 DIAGNOSIS — E872 Acidosis: Secondary | ICD-10-CM | POA: Diagnosis present

## 2020-04-10 DIAGNOSIS — N390 Urinary tract infection, site not specified: Secondary | ICD-10-CM | POA: Diagnosis not present

## 2020-04-10 DIAGNOSIS — E860 Dehydration: Secondary | ICD-10-CM | POA: Diagnosis present

## 2020-04-10 DIAGNOSIS — Z791 Long term (current) use of non-steroidal anti-inflammatories (NSAID): Secondary | ICD-10-CM | POA: Diagnosis not present

## 2020-04-10 DIAGNOSIS — Z79899 Other long term (current) drug therapy: Secondary | ICD-10-CM

## 2020-04-10 DIAGNOSIS — R8281 Pyuria: Secondary | ICD-10-CM | POA: Diagnosis not present

## 2020-04-10 DIAGNOSIS — J189 Pneumonia, unspecified organism: Secondary | ICD-10-CM | POA: Diagnosis present

## 2020-04-10 DIAGNOSIS — Z882 Allergy status to sulfonamides status: Secondary | ICD-10-CM | POA: Diagnosis not present

## 2020-04-10 DIAGNOSIS — N170 Acute kidney failure with tubular necrosis: Secondary | ICD-10-CM

## 2020-04-10 DIAGNOSIS — E1165 Type 2 diabetes mellitus with hyperglycemia: Secondary | ICD-10-CM | POA: Diagnosis not present

## 2020-04-10 DIAGNOSIS — N179 Acute kidney failure, unspecified: Secondary | ICD-10-CM

## 2020-04-10 DIAGNOSIS — I10 Essential (primary) hypertension: Secondary | ICD-10-CM | POA: Diagnosis not present

## 2020-04-10 DIAGNOSIS — R2681 Unsteadiness on feet: Secondary | ICD-10-CM | POA: Diagnosis not present

## 2020-04-10 DIAGNOSIS — E1122 Type 2 diabetes mellitus with diabetic chronic kidney disease: Secondary | ICD-10-CM | POA: Diagnosis present

## 2020-04-10 DIAGNOSIS — S2241XA Multiple fractures of ribs, right side, initial encounter for closed fracture: Secondary | ICD-10-CM | POA: Diagnosis not present

## 2020-04-10 DIAGNOSIS — G9341 Metabolic encephalopathy: Secondary | ICD-10-CM | POA: Diagnosis not present

## 2020-04-10 DIAGNOSIS — E1151 Type 2 diabetes mellitus with diabetic peripheral angiopathy without gangrene: Secondary | ICD-10-CM | POA: Diagnosis present

## 2020-04-10 DIAGNOSIS — Z794 Long term (current) use of insulin: Secondary | ICD-10-CM | POA: Diagnosis not present

## 2020-04-10 DIAGNOSIS — R400 Somnolence: Secondary | ICD-10-CM | POA: Diagnosis not present

## 2020-04-10 DIAGNOSIS — M2550 Pain in unspecified joint: Secondary | ICD-10-CM | POA: Diagnosis not present

## 2020-04-10 DIAGNOSIS — E875 Hyperkalemia: Secondary | ICD-10-CM | POA: Diagnosis present

## 2020-04-10 DIAGNOSIS — S0990XA Unspecified injury of head, initial encounter: Secondary | ICD-10-CM | POA: Diagnosis not present

## 2020-04-10 DIAGNOSIS — N183 Chronic kidney disease, stage 3 unspecified: Secondary | ICD-10-CM | POA: Diagnosis present

## 2020-04-10 DIAGNOSIS — M6259 Muscle wasting and atrophy, not elsewhere classified, multiple sites: Secondary | ICD-10-CM | POA: Diagnosis not present

## 2020-04-10 DIAGNOSIS — Z20822 Contact with and (suspected) exposure to covid-19: Secondary | ICD-10-CM | POA: Diagnosis present

## 2020-04-10 DIAGNOSIS — Z7401 Bed confinement status: Secondary | ICD-10-CM | POA: Diagnosis not present

## 2020-04-10 DIAGNOSIS — R404 Transient alteration of awareness: Secondary | ICD-10-CM | POA: Diagnosis not present

## 2020-04-10 DIAGNOSIS — R5381 Other malaise: Secondary | ICD-10-CM | POA: Diagnosis not present

## 2020-04-10 DIAGNOSIS — J9811 Atelectasis: Secondary | ICD-10-CM | POA: Diagnosis not present

## 2020-04-10 HISTORY — DX: Chronic kidney disease, stage 3 unspecified: N18.30

## 2020-04-10 LAB — CBC WITH DIFFERENTIAL/PLATELET
Abs Immature Granulocytes: 0.02 10*3/uL (ref 0.00–0.07)
Basophils Absolute: 0.1 10*3/uL (ref 0.0–0.1)
Basophils Relative: 1 %
Eosinophils Absolute: 0 10*3/uL (ref 0.0–0.5)
Eosinophils Relative: 0 %
HCT: 41.4 % (ref 36.0–46.0)
Hemoglobin: 12.5 g/dL (ref 12.0–15.0)
Immature Granulocytes: 0 %
Lymphocytes Relative: 13 %
Lymphs Abs: 1 10*3/uL (ref 0.7–4.0)
MCH: 26.2 pg (ref 26.0–34.0)
MCHC: 30.2 g/dL (ref 30.0–36.0)
MCV: 86.8 fL (ref 80.0–100.0)
Monocytes Absolute: 0.3 10*3/uL (ref 0.1–1.0)
Monocytes Relative: 4 %
Neutro Abs: 6.4 10*3/uL (ref 1.7–7.7)
Neutrophils Relative %: 82 %
Platelets: 198 10*3/uL (ref 150–400)
RBC: 4.77 MIL/uL (ref 3.87–5.11)
RDW: 12.9 % (ref 11.5–15.5)
WBC: 7.8 10*3/uL (ref 4.0–10.5)
nRBC: 0 % (ref 0.0–0.2)

## 2020-04-10 LAB — RAPID URINE DRUG SCREEN, HOSP PERFORMED
Amphetamines: NOT DETECTED
Barbiturates: NOT DETECTED
Benzodiazepines: NOT DETECTED
Cocaine: NOT DETECTED
Opiates: NOT DETECTED
Tetrahydrocannabinol: NOT DETECTED

## 2020-04-10 LAB — I-STAT VENOUS BLOOD GAS, ED
Acid-base deficit: 2 mmol/L (ref 0.0–2.0)
Bicarbonate: 25.5 mmol/L (ref 20.0–28.0)
Calcium, Ion: 1.09 mmol/L — ABNORMAL LOW (ref 1.15–1.40)
HCT: 43 % (ref 36.0–46.0)
Hemoglobin: 14.6 g/dL (ref 12.0–15.0)
O2 Saturation: 94 %
Potassium: 7 mmol/L (ref 3.5–5.1)
Sodium: 124 mmol/L — ABNORMAL LOW (ref 135–145)
TCO2: 27 mmol/L (ref 22–32)
pCO2, Ven: 54.9 mmHg (ref 44.0–60.0)
pH, Ven: 7.275 (ref 7.250–7.430)
pO2, Ven: 83 mmHg — ABNORMAL HIGH (ref 32.0–45.0)

## 2020-04-10 LAB — COMPREHENSIVE METABOLIC PANEL
ALT: 12 U/L (ref 0–44)
AST: 13 U/L — ABNORMAL LOW (ref 15–41)
Albumin: 3.5 g/dL (ref 3.5–5.0)
Alkaline Phosphatase: 239 U/L — ABNORMAL HIGH (ref 38–126)
Anion gap: 17 — ABNORMAL HIGH (ref 5–15)
BUN: 28 mg/dL — ABNORMAL HIGH (ref 8–23)
CO2: 19 mmol/L — ABNORMAL LOW (ref 22–32)
Calcium: 8.9 mg/dL (ref 8.9–10.3)
Chloride: 88 mmol/L — ABNORMAL LOW (ref 98–111)
Creatinine, Ser: 2.22 mg/dL — ABNORMAL HIGH (ref 0.44–1.00)
GFR, Estimated: 24 mL/min — ABNORMAL LOW (ref >60)
Glucose, Bld: 870 mg/dL (ref 70–99)
Potassium: 6.9 mmol/L (ref 3.5–5.1)
Sodium: 124 mmol/L — ABNORMAL LOW (ref 135–145)
Total Bilirubin: 1.2 mg/dL (ref 0.3–1.2)
Total Protein: 7.2 g/dL (ref 6.5–8.1)

## 2020-04-10 LAB — ETHANOL: Alcohol, Ethyl (B): <10 mg/dL (ref <10)

## 2020-04-10 LAB — URINALYSIS, ROUTINE W REFLEX MICROSCOPIC
Bilirubin Urine: NEGATIVE
Glucose, UA: >=500 mg/dL — AB
Ketones, ur: 20 mg/dL — AB
Nitrite: NEGATIVE
Protein, ur: NEGATIVE mg/dL
Specific Gravity, Urine: 1.017 (ref 1.005–1.030)
WBC, UA: >50 WBC/hpf — ABNORMAL HIGH (ref 0–5)
pH: 5 (ref 5.0–8.0)

## 2020-04-10 LAB — CBG MONITORING, ED
Glucose-Capillary: >600 mg/dL (ref 70–99)
Glucose-Capillary: 466 mg/dL — ABNORMAL HIGH (ref 70–99)
Glucose-Capillary: 543 mg/dL (ref 70–99)
Glucose-Capillary: 363 mg/dL — ABNORMAL HIGH (ref 70–99)
Glucose-Capillary: 239 mg/dL — ABNORMAL HIGH (ref 70–99)
Glucose-Capillary: 151 mg/dL — ABNORMAL HIGH (ref 70–99)

## 2020-04-10 LAB — CBC
HCT: 35.4 % — ABNORMAL LOW (ref 36.0–46.0)
Hemoglobin: 11.5 g/dL — ABNORMAL LOW (ref 12.0–15.0)
MCH: 27 pg (ref 26.0–34.0)
MCHC: 32.5 g/dL (ref 30.0–36.0)
MCV: 83.1 fL (ref 80.0–100.0)
Platelets: 188 10*3/uL (ref 150–400)
RBC: 4.26 MIL/uL (ref 3.87–5.11)
RDW: 13.1 % (ref 11.5–15.5)
WBC: 8 10*3/uL (ref 4.0–10.5)
nRBC: 0 % (ref 0.0–0.2)

## 2020-04-10 LAB — TSH: TSH: 0.838 u[IU]/mL (ref 0.350–4.500)

## 2020-04-10 LAB — BASIC METABOLIC PANEL
Anion gap: 16 — ABNORMAL HIGH (ref 5–15)
BUN: 29 mg/dL — ABNORMAL HIGH (ref 8–23)
CO2: 22 mmol/L (ref 22–32)
Calcium: 8.8 mg/dL — ABNORMAL LOW (ref 8.9–10.3)
Chloride: 93 mmol/L — ABNORMAL LOW (ref 98–111)
Creatinine, Ser: 1.88 mg/dL — ABNORMAL HIGH (ref 0.44–1.00)
GFR, Estimated: 29 mL/min — ABNORMAL LOW (ref >60)
Glucose, Bld: 591 mg/dL (ref 70–99)
Potassium: 5.4 mmol/L — ABNORMAL HIGH (ref 3.5–5.1)
Sodium: 131 mmol/L — ABNORMAL LOW (ref 135–145)

## 2020-04-10 LAB — PHOSPHORUS: Phosphorus: 3.4 mg/dL (ref 2.5–4.6)

## 2020-04-10 LAB — TROPONIN I (HIGH SENSITIVITY)
Troponin I (High Sensitivity): 14 ng/L (ref <18)
Troponin I (High Sensitivity): 17 ng/L (ref <18)

## 2020-04-10 LAB — ACETAMINOPHEN LEVEL: Acetaminophen (Tylenol), Serum: 15 ug/mL (ref 10–30)

## 2020-04-10 LAB — SARS CORONAVIRUS 2 BY RT PCR (HOSPITAL ORDER, PERFORMED IN ~~LOC~~ HOSPITAL LAB): SARS Coronavirus 2: NEGATIVE

## 2020-04-10 LAB — SALICYLATE LEVEL: Salicylate Lvl: <7 mg/dL — ABNORMAL LOW (ref 7.0–30.0)

## 2020-04-10 LAB — BETA-HYDROXYBUTYRIC ACID
Beta-Hydroxybutyric Acid: 4.78 mmol/L — ABNORMAL HIGH (ref 0.05–0.27)
Beta-Hydroxybutyric Acid: 3.34 mmol/L — ABNORMAL HIGH (ref 0.05–0.27)

## 2020-04-10 LAB — AMMONIA: Ammonia: 13 umol/L (ref 9–35)

## 2020-04-10 MED ORDER — LABETALOL HCL 5 MG/ML IV SOLN: 10.0000 mg | INTRAVENOUS | Status: DC | PRN

## 2020-04-10 MED ORDER — CALCIUM GLUCONATE 10 % IV SOLN
1.0000 g | Freq: Once | INTRAVENOUS | Status: AC
  Administered 2020-04-10: 1 g via INTRAVENOUS
  Filled 2020-04-10: qty 10

## 2020-04-10 MED ORDER — ALBUTEROL SULFATE HFA 108 (90 BASE) MCG/ACT IN AERS
2.0000 | INHALATION_SPRAY | Freq: Once | RESPIRATORY_TRACT | Status: AC
  Administered 2020-04-10: 2 via RESPIRATORY_TRACT
  Filled 2020-04-10: qty 6.7

## 2020-04-10 MED ORDER — LACTATED RINGERS IV SOLN: INTRAVENOUS | Status: DC

## 2020-04-10 MED ORDER — SODIUM CHLORIDE 0.9 % IV BOLUS
500.0000 mL | Freq: Once | INTRAVENOUS | Status: AC
  Administered 2020-04-10: 500 mL via INTRAVENOUS

## 2020-04-10 MED ORDER — DEXTROSE-NACL 5-0.45 % IV SOLN: INTRAVENOUS | Status: DC

## 2020-04-10 MED ORDER — DEXTROSE 50 % IV SOLN: 0.0000 mL | INTRAVENOUS | Status: DC | PRN

## 2020-04-10 MED ORDER — SODIUM CHLORIDE 0.9 % IV SOLN
1.0000 g | Freq: Once | INTRAVENOUS | Status: AC
  Administered 2020-04-10: 1 g via INTRAVENOUS
  Filled 2020-04-10: qty 10

## 2020-04-10 MED ORDER — ENOXAPARIN SODIUM 30 MG/0.3ML ~~LOC~~ SOLN
30.0000 mg | SUBCUTANEOUS | Status: DC
  Administered 2020-04-10: 30 mg via SUBCUTANEOUS
  Filled 2020-04-10: qty 0.3

## 2020-04-10 MED ORDER — LACTATED RINGERS IV BOLUS
20.0000 mL/kg | Freq: Once | INTRAVENOUS | Status: AC
  Administered 2020-04-10: 2042 mL via INTRAVENOUS

## 2020-04-10 MED ORDER — SODIUM CHLORIDE 0.9% FLUSH
10.0000 mL | Freq: Two times a day (BID) | INTRAVENOUS | Status: DC
  Administered 2020-04-11 – 2020-04-16 (×11): 10 mL

## 2020-04-10 MED ORDER — METOPROLOL TARTRATE 5 MG/5ML IV SOLN
5.0000 mg | Freq: Four times a day (QID) | INTRAVENOUS | Status: DC
  Administered 2020-04-10 – 2020-04-11 (×2): 5 mg via INTRAVENOUS
  Filled 2020-04-10 (×3): qty 5

## 2020-04-10 MED ORDER — INSULIN ASPART 100 UNIT/ML IV SOLN
5.0000 [IU] | Freq: Once | INTRAVENOUS | Status: AC
  Administered 2020-04-10: 5 [IU] via INTRAVENOUS

## 2020-04-10 MED ORDER — SODIUM CHLORIDE 0.9 % IV SOLN
500.0000 mg | Freq: Once | INTRAVENOUS | Status: AC
  Administered 2020-04-10: 500 mg via INTRAVENOUS
  Filled 2020-04-10: qty 500

## 2020-04-10 MED ORDER — INSULIN REGULAR(HUMAN) IN NACL 100-0.9 UT/100ML-% IV SOLN
INTRAVENOUS | Status: DC
  Administered 2020-04-10: 11.5 [IU]/h via INTRAVENOUS
  Filled 2020-04-10: qty 100

## 2020-04-10 MED ORDER — INSULIN REGULAR(HUMAN) IN NACL 100-0.9 UT/100ML-% IV SOLN: INTRAVENOUS | Status: DC

## 2020-04-10 MED ORDER — SODIUM ZIRCONIUM CYCLOSILICATE 5 G PO PACK
5.0000 g | PACK | Freq: Once | ORAL | Status: AC
  Administered 2020-04-10: 5 g via ORAL
  Filled 2020-04-10: qty 1

## 2020-04-10 MED ORDER — DEXTROSE IN LACTATED RINGERS 5 % IV SOLN: INTRAVENOUS | Status: DC

## 2020-04-10 MED ORDER — SODIUM CHLORIDE 0.9% FLUSH
10.0000 mL | INTRAVENOUS | Status: DC | PRN
Start: 2020-04-10 — End: 2020-04-16

## 2020-04-10 MED ORDER — SODIUM CHLORIDE 0.9 % IV SOLN
1.0000 g | INTRAVENOUS | Status: AC
  Administered 2020-04-11 – 2020-04-14 (×4): 1 g via INTRAVENOUS
  Filled 2020-04-10: qty 1
  Filled 2020-04-10 (×2): qty 10
  Filled 2020-04-10: qty 1

## 2020-04-10 MED ORDER — SODIUM BICARBONATE 8.4 % IV SOLN
50.0000 meq | Freq: Once | INTRAVENOUS | Status: AC
  Administered 2020-04-10: 50 meq via INTRAVENOUS
  Filled 2020-04-10: qty 50

## 2020-04-10 MED ORDER — SODIUM CHLORIDE 0.9 % IV SOLN: INTRAVENOUS | Status: DC

## 2020-04-10 MED ORDER — AZITHROMYCIN 500 MG IV SOLR
500.0000 mg | INTRAVENOUS | Status: AC
Start: 2020-04-11 — End: 2020-04-14
  Administered 2020-04-11 – 2020-04-14 (×4): 500 mg via INTRAVENOUS
  Filled 2020-04-10 (×4): qty 500

## 2020-04-10 NOTE — ED Notes (Signed)
Copy of critical lab report given to Dr Jacqulyn Bath

## 2020-04-10 NOTE — ED Notes (Signed)
Pt urinating properly using external catheter, I&O's to be tracked.

## 2020-04-10 NOTE — ED Provider Notes (Signed)
Emergency Department Provider Note   I have reviewed the triage vital signs and the nursing notes.   HISTORY  Chief Complaint No chief complaint on file.   HPI Rachel Gonzalez is a 70 y.o. female with past medical history reviewed below including diabetes and elevated blood pressure presents to the emergency department for evaluation of altered mental status since a mechanical fall earlier this week. Patient was seen at urgent care after a trip and fall incident. She was found to have 2 right posterior rib fractures along with skin abrasion to the leg. Patient's husband states that shortly after returning home from that visit she has been more confused and sleepy. He states typically she is awake, alert, talkative. He tells me she has been taking all of her home medications but notes that she has been "drinking a lot of juice." EMS was called today with concern for continued confusion. EMS report on scene there CBG reading "high." Husband denies any new falls or medications.   Level 5 caveat: AMS    Past Medical History:  Diagnosis Date  . Anemia   . Arthritis    right finger  . Chronic renal disease, stage 3, moderately decreased glomerular filtration rate (GFR) between 30-59 mL/min/1.73 square meter (HCC)   . Diabetes mellitus   . GERD (gastroesophageal reflux disease)   . Hypertension     Patient Active Problem List   Diagnosis Date Noted  . Acute metabolic encephalopathy 04/11/2020  . Acute lower UTI 04/11/2020  . DKA (diabetic ketoacidosis) (HCC) 04/10/2020  . Altered mental status 04/10/2020  . CAP (community acquired pneumonia) 04/10/2020  . Pyuria 04/10/2020  . Stage 3 chronic kidney disease (HCC) 12/01/2016  . Frequent falls 04/28/2016  . Right arm weakness 04/28/2016  . Bilateral lower extremity edema 04/28/2016  . Screening for colon cancer 04/28/2016  . Abnormal urinalysis 09/13/2014  . Chest pain 09/13/2014  . Abdominal pain, recurrent 09/13/2014  .  Dehydration with hyponatremia 09/13/2014  . Hyperkalemia 09/13/2014  . Acute renal failure superimposed on stage 3 chronic kidney disease (HCC) 09/13/2014  . Anemia 05/06/2014  . Diabetic neuropathy, type II diabetes mellitus (HCC) 05/06/2014  . Obesity (BMI 30-39.9) 05/06/2014  . Cellulitis of right foot 05/06/2014  . Essential hypertension 03/07/2011  . 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 Right arm pain 08/04/2006  . Osteoarthritis 02/14/2006  . Uncontrolled type 2 diabetes mellitus with hyperglycemia (HCC) 01/11/2006    Past Surgical History:  Procedure Laterality Date  . AMPUTATION Right 05/08/2014   Procedure: AMPUTATION RAY, right great toe;  Surgeon: Nadara Mustard, MD;  Location: [facility] OR;  Service: Orthopedics;  Laterality: Right;  . arm surgery     . CESAREAN SECTION    . COLONOSCOPY    . I & D EXTREMITY Right 05/08/2014   Procedure: IRRIGATION AND DEBRIDEMENT FOOT;  Surgeon: Nadara Mustard, MD;  Location: [facility] OR;  Service: Orthopedics;  Laterality: Right;  . MEMBRANE PEEL Right 03/15/2018   Procedure: MEMBRANE PEEL;  Surgeon: Carmela Rima, MD;  Location: [facility] OR;  Service: Ophthalmology;  Laterality: Right;  . PHOTOCOAGULATION WITH LASER Right 03/15/2018   Procedure: PHOTOCOAGULATION WITH LASER;  Surgeon: Carmela Rima, MD;  Location: [facility] OR;  Service: Ophthalmology;  Laterality: Right;  . 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 Diabetes Mother     Social History Social History   Tobacco Use  . Smoking status: Never Smoker  . Smokeless  tobacco: Never Used  Vaping Use  . Vaping Use: Never used  Substance Use Topics  . Alcohol use: No  . Drug use: No    Review of Systems  Level 5 caveat: AMS  ____________________________________________   PHYSICAL EXAM:  VITAL SIGNS: ED Triage Vitals  Enc Vitals Group     BP 04/10/20 1058 (!) 214/77     Pulse Rate 04/10/20 1058 90     Resp 04/10/20 1058 16     Temp 04/10/20 1058 98.6 F (37 C)     Temp Source 04/10/20 1058 Oral     SpO2 04/10/20 1058 100 %   Constitutional: Drowsy but opens eyes to voice. Makes eye contact but answers are slow and often confused.  Eyes: Conjunctivae are normal. PERRL (3mm bilaterally).  Head: Atraumatic. Nose: No congestion/rhinnorhea. Mouth/Throat: Mucous membranes are dry.  Neck: No stridor.  No cervical spine tenderness to palpation. Cardiovascular: Normal rate, regular rhythm. Good peripheral circulation. Grossly normal heart sounds.   Respiratory: Normal respiratory effort.  No retractions. Lungs CTAB. Gastrointestinal: Soft and nontender. No distention.  Musculoskeletal: No lower extremity tenderness nor edema. No gross deformities of extremities. Neurologic: Speech appears normal but patient encephalopathic. No focal neuro deficits appreciated but exam limited by patient's inability to briskly follow commands consistently.  Skin:  Skin is warm, dry and intact. No rash noted.   ____________________________________________   LABS (all labs ordered are listed, but only abnormal results are displayed)  Labs Reviewed  COMPREHENSIVE METABOLIC PANEL - Abnormal; Notable for the following components:      Result Value   Sodium 124 (*)    Potassium 6.9 (*)    Chloride 88 (*)    CO2 19 (*)    Glucose, Bld 870 (*)    BUN 28 (*)    Creatinine, Ser 2.22 (*)    AST 13 (*)    Alkaline Phosphatase 239 (*)    GFR, Estimated 24 (*)    Anion gap 17 (*)    All other components within normal limits  SALICYLATE LEVEL - Abnormal; Notable  for the following components:   Salicylate Lvl <7.0 (*)    All other components within normal limits  URINALYSIS, ROUTINE W REFLEX MICROSCOPIC - Abnormal; Notable for the following components:   Color, Urine STRAW (*)    APPearance HAZY (*)    Glucose, UA >=500 (*)    Hgb urine dipstick SMALL (*)    Ketones, ur 20 (*)    Leukocytes,Ua MODERATE (*)    WBC, UA >50 (*)    Bacteria, UA RARE (*)    All other components within normal limits  BETA-HYDROXYBUTYRIC ACID - Abnormal; Notable for the following components:   Beta-Hydroxybutyric Acid 4.78 (*)    All other components within normal limits  CBC - Abnormal; Notable for the following components:   Hemoglobin 11.5 (*)    HCT 35.4 (*)    All other components within normal limits  BASIC METABOLIC PANEL - Abnormal; Notable for the following components:   Sodium 131 (*)    Potassium 5.4 (*)    Chloride 93 (*)    Glucose, Bld 591 (*)    BUN  29 (*)    Creatinine, Ser 1.88 (*)    Calcium 8.8 (*)    GFR, Estimated 29 (*)    Anion gap 16 (*)    All other components within normal limits  BASIC METABOLIC PANEL - Abnormal; Notable for the following components:   Glucose, Bld 147 (*)    BUN 27 (*)    Creatinine, Ser 1.79 (*)    GFR, Estimated 31 (*)    All other components within normal limits  BASIC METABOLIC PANEL - Abnormal; Notable for the following components:   Glucose, Bld 160 (*)    BUN 25 (*)    Creatinine, Ser 1.88 (*)    GFR, Estimated 29 (*)    All other components within normal limits  BETA-HYDROXYBUTYRIC ACID - Abnormal; Notable for the following components:   Beta-Hydroxybutyric Acid 3.34 (*)    All other components within normal limits  BETA-HYDROXYBUTYRIC ACID - Abnormal; Notable for the following components:   Beta-Hydroxybutyric Acid 0.34 (*)    All other components within normal limits  BASIC METABOLIC PANEL - Abnormal; Notable for the following components:   Glucose, Bld 127 (*)    BUN 25 (*)    Creatinine, Ser  1.83 (*)    GFR, Estimated 30 (*)    All other components within normal limits  CBC WITH DIFFERENTIAL/PLATELET - Abnormal; Notable for the following components:   Hemoglobin 10.6 (*)    HCT 32.5 (*)    All other components within normal limits  COMPREHENSIVE METABOLIC PANEL - Abnormal; Notable for the following components:   Glucose, Bld 143 (*)    Creatinine, Ser 1.77 (*)    Calcium 8.8 (*)    Total Protein 5.6 (*)    Albumin 2.6 (*)    AST 14 (*)    Alkaline Phosphatase 142 (*)    GFR, Estimated 31 (*)    All other components within normal limits  GLUCOSE, CAPILLARY - Abnormal; Notable for the following components:   Glucose-Capillary 188 (*)    All other components within normal limits  GLUCOSE, CAPILLARY - Abnormal; Notable for the following components:   Glucose-Capillary 164 (*)    All other components within normal limits  GLUCOSE, CAPILLARY - Abnormal; Notable for the following components:   Glucose-Capillary 135 (*)    All other components within normal limits  GLUCOSE, CAPILLARY - Abnormal; Notable for the following components:   Glucose-Capillary 131 (*)    All other components within normal limits  CBG MONITORING, ED - Abnormal; Notable for the following components:   Glucose-Capillary >600 (*)    All other components within normal limits  I-STAT VENOUS BLOOD GAS, ED - Abnormal; Notable for the following components:   pO2, Ven 83.0 (*)    Sodium 124 (*)    Potassium 7.0 (*)    Calcium, Ion 1.09 (*)    All other components within normal limits  CBG MONITORING, ED - Abnormal; Notable for the following components:   Glucose-Capillary 543 (*)    All other components within normal limits  CBG MONITORING, ED - Abnormal; Notable for the following components:   Glucose-Capillary 466 (*)    All other components within normal limits  CBG MONITORING, ED - Abnormal; Notable for the following components:   Glucose-Capillary 363 (*)    All other components within normal  limits  CBG MONITORING, ED - Abnormal; Notable for the following components:   Glucose-Capillary 239 (*)    All other components within normal limits  CBG MONITORING, ED - Abnormal; Notable for the following components:   Glucose-Capillary 151 (*)    All other components within normal limits  CBG MONITORING, ED - Abnormal; Notable for the following components:   Glucose-Capillary 143 (*)    All other components within normal limits  CBG MONITORING, ED - Abnormal; Notable for the following components:   Glucose-Capillary 183 (*)    All other components within normal limits  CBG MONITORING, ED - Abnormal; Notable for the following components:   Glucose-Capillary 156 (*)    All other components within normal limits  CBG MONITORING, ED - Abnormal; Notable for the following components:   Glucose-Capillary 163 (*)    All other components within normal limits  CBG MONITORING, ED - Abnormal; Notable for the following components:   Glucose-Capillary 158 (*)    All other components within normal limits  CBG MONITORING, ED - Abnormal; Notable for the following components:   Glucose-Capillary 153 (*)    All other components within normal limits  CBG MONITORING, ED - Abnormal; Notable for the following components:   Glucose-Capillary 243 (*)    All other components within normal limits  URINE CULTURE  SARS CORONAVIRUS 2 BY RT PCR (HOSPITAL ORDER, PERFORMED IN [facility])  CULTURE, BLOOD (ROUTINE X 2)  CULTURE, BLOOD (ROUTINE X 2)  ETHANOL  ACETAMINOPHEN LEVEL  CBC WITH DIFFERENTIAL/PLATELET  TSH  AMMONIA  RAPID URINE DRUG SCREEN, HOSP PERFORMED  PHOSPHORUS  BETA-HYDROXYBUTYRIC ACID  HEMOGLOBIN A1C  TROPONIN I (HIGH SENSITIVITY)  TROPONIN I (HIGH SENSITIVITY)   ____________________________________________  EKG   EKG Interpretation  Date/Time:  Wednesday April 10 2020 11:13:24 EST Ventricular Rate:  92 PR Interval:  166 QRS Duration: 76 QT Interval:  352 QTC  Calculation: 435 R Axis:   57 Text Interpretation: Normal sinus rhythm Cannot rule out Anterior infarct , age undetermined Abnormal ECG Confirmed by Alona Bene [phone]) on 04/10/2020 11:34:52 AM       ____________________________________________  RADIOLOGY  No results found.  ____________________________________________   PROCEDURES  Procedure(s) performed:   .Critical Care Performed by: Maia Plan, MD Authorized by: Maia Plan, MD   Critical care provider statement:    Critical care time (minutes):  45   Critical care time was exclusive of:  Separately billable procedures and treating other patients and teaching time   Critical care was necessary to treat or prevent imminent or life-threatening deterioration of the following conditions:  Dehydration and endocrine crisis   Critical care was time spent personally by me on the following activities:  Discussions with consultants, evaluation of patient's response to treatment, examination of patient, ordering and performing treatments and interventions, ordering and review of laboratory studies, ordering and review of radiographic studies, pulse oximetry, re-evaluation of patient's condition, obtaining history from patient or surrogate, review of old charts, blood draw for specimens and development of treatment plan with patient or surrogate   I assumed direction of critical care for this patient from another provider in my specialty: no     Care discussed with: accepting provider at another facility       ____________________________________________   INITIAL IMPRESSION / ASSESSMENT AND PLAN / ED COURSE  Pertinent labs & imaging results that were available during my care of the patient were reviewed by me and considered in my medical decision making (see chart for details).   Patient presents to the emergency department mainly with encephalopathy type presentation.  She is unable to describe if she  is having any chest  discomfort.  Husband does not recall this complaint earlier in the week.  Her EKG shows some ST changes but tracing does have some artifact.  Will add on a troponin as this does seem different from prior but patient's overall clinical presentation is not consistent with STEMI or primary ACS presentation. Patient going urgently for CT head and repeat labs along with COVID. Will allow for permissive HTN for now with CVA on the differential but hypertensive emergency vs ICH also a consideration.   11:45 AM  I STAT VBG with metabolic acidosis and severe hyperkalemia. Have added meds to shift K. Continue to follow. Room being cleaned and patient can be moved out of the hallway.   Labs consistent with DKA. Insulin infusion started.   Discussed patient's case with [facility] to request admission. Patient and family (if present) updated with plan. Care transferred to [facility] service.  I reviewed all nursing notes, vitals, pertinent old records, EKGs, labs, imaging (as available).  ____________________________________________  FINAL CLINICAL IMPRESSION(S) / ED DIAGNOSES  Final diagnoses:  Diabetic ketoacidosis without coma associated with type 2 diabetes mellitus (HCC)  AKI (acute kidney injury) (HCC)  Hyperkalemia     MEDICATIONS GIVEN DURING THIS VISIT:  Medications  0.9 %  sodium chloride infusion (0 mLs Intravenous Hold 04/10/20 1804)  dextrose 5 %-0.45 % sodium chloride infusion (0 mLs Intravenous Hold 04/10/20 1650)  cefTRIAXone (ROCEPHIN) 1 g in sodium chloride 0.9 % 100 mL IVPB (0 g Intravenous Stopped 04/11/20 1227)  azithromycin (ZITHROMAX) 500 mg in sodium chloride 0.9 % 250 mL IVPB (0 mg Intravenous Stopped 04/11/20 1259)  sodium chloride flush (NS) 0.9 % injection 10-40 mL (10 mLs Intracatheter Given 04/11/20 2247)  sodium chloride flush (NS) 0.9 % injection 10-40 mL (has no administration in time range)  insulin aspart (novoLOG) injection 0-9 Units (2 Units Subcutaneous Given 04/11/20 1823)  insulin  aspart (novoLOG) injection 0-5 Units (0 Units Subcutaneous Not Given 04/11/20 2203)  insulin glargine (LANTUS) injection 20 Units (20 Units Subcutaneous Given 04/11/20 2248)  pantoprazole (PROTONIX) EC tablet 40 mg (40 mg Oral Given 04/11/20 1150)  sodium chloride 0.9 % bolus 500 mL (0 mLs Intravenous Stopped 04/10/20 1402)  insulin aspart (novoLOG) injection 5 Units (5 Units Intravenous Given 04/10/20 1248)  sodium bicarbonate injection 50 mEq (50 mEq Intravenous Given 04/10/20 1236)  calcium gluconate inj 10% (1 g) URGENT USE ONLY! (1 g Intravenous Given 04/10/20 1236)  sodium zirconium cyclosilicate (LOKELMA) packet 5 g (5 g Oral Given 04/10/20 1402)  albuterol (VENTOLIN HFA) 108 (90 Base) MCG/ACT inhaler 2 puff (2 puffs Inhalation Given 04/10/20 1242)  lactated ringers bolus 2,042 mL (0 mL/kg  102.1 kg Intravenous Stopped 04/10/20 1804)  cefTRIAXone (ROCEPHIN) 1 g in sodium chloride 0.9 % 100 mL IVPB (0 g Intravenous Stopped 04/10/20 1835)  azithromycin (ZITHROMAX) 500 mg in sodium chloride 0.9 % 250 mL IVPB (0 mg Intravenous Stopped 04/10/20 2100)    Note:  This document was prepared using Dragon voice recognition software and may include unintentional dictation errors.  Alona Bene, MD, [facility] Emergency Medicine    Honesty Menta, Arlyss Repress, MD 04/12/20 [phone]

## 2020-04-10 NOTE — ED Notes (Signed)
Patient transported to X-ray 

## 2020-04-10 NOTE — ED Notes (Signed)
Date and time results received: 04/10/20  "1:08 PM"    Test: Potassium, glucose Critical Value: K: 6.9; Glucose: 870  Name of Provider Notified: Dr. Jacqulyn Bath

## 2020-04-10 NOTE — H&P (Signed)
[facility] History & Physical  Kelly Splinter MD  Referring Provider: Dr. Wanda Plump 04/10/2020  Chief Complaint: Altered mental status HPI: The patient is a 70 y.o. year-old /w hx of DM2 on insulin, CKD 3, HTN who was in ED on 1/30 3 days ago after fall and c/o cold symptoms for > 2 wks. Refused Covid test then. Xray showed nondisplaced fx's of R 8th and 9th rib. Pt dc'd home w/ instructions for rest and ice and f/u PCP 4-5 days. Pt now presenting w/ AMS and high BS 890.  K+ 6.9.  Asked to see for admission.   Pt seen in ED.  She is very lethargic but arouses to loud voice and answered a few simple questions but was still quite confused.   Could not get any hx, patient is incoherent.   Old chart:    2001- DKA  Feb 2016 - uncont DM2, RLE cellulitis, HTN, diab nephropathy  July 2016 - DKA, ^K, AKI, abnormal UA, dehydration, ckd 10 May 2019 - AKI on CKD 3b d/t dehydration, recent LE cellulitis, N/V and dehydration. Treated w/ IVF"s, abx and insulin.        ROS - n/a  Past Medical History  Past Medical History:  Diagnosis Date  . Anemia   . Arthritis    right finger  . Diabetes mellitus   . GERD (gastroesophageal reflux disease)   . Hypertension   . Renal disorder    Past Surgical History  Past Surgical History:  Procedure Laterality Date  . AMPUTATION Right 05/08/2014   Procedure: AMPUTATION RAY, right great toe;  Surgeon: Newt Minion, MD;  Location: [place];  Service: Orthopedics;  Laterality: Right;  . arm surgery     . CESAREAN SECTION    . COLONOSCOPY    . I & D EXTREMITY Right 05/08/2014   Procedure: IRRIGATION AND DEBRIDEMENT FOOT;  Surgeon: Newt Minion, MD;  Location: [place];  Service: Orthopedics;  Laterality: Right;  . MEMBRANE PEEL Right 03/15/2018   Procedure: MEMBRANE PEEL;  Surgeon: Jalene Mullet, MD;  Location: [place];  Service: Ophthalmology;  Laterality: Right;  . PHOTOCOAGULATION WITH LASER Right 03/15/2018   Procedure:  PHOTOCOAGULATION WITH LASER;  Surgeon: Jalene Mullet, MD;  Location: [place];  Service: Ophthalmology;  Laterality: Right;  . 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/2018   Procedure: RIGHT EYE COMPLEX RETINA DETACHMENT VITRECTOMY MEMBRANE PEEL WITH AIR,GAS,SILICONE OIL PHOTOCOAGULATION;  Surgeon: Jalene Mullet, MD;  Location: [place];  Service: Ophthalmology;  Laterality: Right;   Family History  Family History  Problem Relation Age of Onset  . Diabetes Mother    Social History  reports that she has never smoked. She has never used smokeless tobacco. She reports that she does not drink alcohol and does not use drugs. Allergies  Allergies  Allergen Reactions  . Tramadol Nausea And Vomiting  . Adhesive [Tape] Itching  . Bactrim [Sulfamethoxazole-Trimethoprim] Other (See Comments)    AKI and hyperkalemia   . Tylenol With Codeine #3 [Acetaminophen-Codeine] Diarrhea and Itching   Home medications Prior to Admission medications   Medication Sig Start Date End Date Taking? Authorizing Provider  acetaminophen (TYLENOL) 500 MG tablet Take 500-1,000 mg by mouth every 6 (six) hours as needed (pain.).    [provider]  acetaminophen-codeine (TYLENOL #3) 300-30 MG tablet TAKE 1 TABLET BY MOUTH EVERY 6 HOURS AS NEEDED FOR MODERATE PAIN 05/23/19   Dorena Dew, FNP  amLODipine (NORVASC) 10 MG tablet Take 1 tablet (10 mg total) by mouth daily. 05/23/19   Dorena Dew, FNP  atorvastatin (LIPITOR) 20 MG tablet Take 0.5 tablets (10 mg total) by mouth daily. 01/19/20   Dorena Dew, FNP  Blood Glucose Monitoring Suppl (ACCU-CHEK AVIVA PLUS) w/Device KIT 1 Device by Does not apply route once. Use as directed to check blood sugar 06/11/15   Dorena Dew, FNP  Blood Glucose Monitoring Suppl (ONE TOUCH ULTRA SYSTEM KIT) w/Device KIT Test three times daily. Dx Type 2 Diabetes. 06/10/15   Dorena Dew, FNP  cetirizine (ZYRTEC ALLERGY) 10 MG tablet Take 1 tablet (10 mg  total) by mouth daily. 04/07/20   Hazel Sams, PA-C  diclofenac (VOLTAREN) 75 MG EC tablet Take 1 tablet (75 mg total) by mouth 2 (two) times daily. 04/07/20   Hazel Sams, PA-C  Dulaglutide (TRULICITY) 0.93 AT/5.5DD SOPN Inject 0.75 mg into the skin once a week. 02/27/20   Dorena Dew, FNP  furosemide (LASIX) 20 MG tablet Take 1 tablet (20 mg total) by mouth daily. 09/01/19 08/31/20  Dorena Dew, FNP  gabapentin (NEURONTIN) 300 MG capsule Take 1 capsule (300 mg total) by mouth 2 (two) times daily. 11/16/19   Dorena Dew, FNP  hydrOXYzine (ATARAX/VISTARIL) 25 MG tablet Take 1-2 tablets (25-50 mg total) by mouth every 6 (six) hours as needed for itching. 11/06/19   Raylene Everts, MD  insulin glargine (LANTUS SOLOSTAR) 100 UNIT/ML Solostar Pen Inject 45 Units into the skin at bedtime. 11/30/19   Dorena Dew, FNP  Lancet Devices [place]) lancets Use as instructed 06/11/15   Dorena Dew, FNP  meloxicam (MOBIC) 15 MG tablet Take 1 tablet (15 mg total) by mouth daily. X 2 wks Patient not taking: Reported on 11/28/2019 06/05/19   Meredith Pel, MD  mupirocin ointment (BACTROBAN) 2 % Apply topically 2 (two) times daily. 12/17/19   Volney American, PA-C  neomycin-bacitracin-polymyxin (NEOSPORIN) [phone] ointment Apply topically in the morning and at bedtime. 04/07/20   Hazel Sams, PA-C  nystatin (NYSTATIN) powder Apply topically 3 (three) times daily. 11/28/19   Dorena Dew, FNP  nystatin ointment (MYCOSTATIN) APPLY 1 APPLICATION OF  OINTMENT TOPICALLY TWICE DAILY 11/29/19   Dorena Dew, FNP  omeprazole (PRILOSEC) 20 MG capsule Take 1 capsule (20 mg total) by mouth daily. 02/27/20   Dorena Dew, FNP  ondansetron (ZOFRAN) 8 MG tablet Take 1 tablet (8 mg total) by mouth every 8 (eight) hours as needed for nausea or vomiting. 04/07/20   Hazel Sams, PA-C  ONE TOUCH LANCETS MISC Test 3 times daily with glucometer. Dx Type 2 diabetes  with unspecified complications. 06/10/15   Dorena Dew, FNP  RELION PEN NEEDLE 31G/8MM 31G X 8 MM MISC USE AS DIRECTED 4 TIMES DAILY 12/20/17   Lanae Boast, FNP  triamcinolone (KENALOG) 0.1 % Apply 1 application topically 2 (two) times daily. 04/07/20   Hazel Sams, PA-C  Triamcinolone Acetonide (TRIAMCINOLONE 0.1 % CREAM : EUCERIN) CREA Apply 1 application topically 3 (three) times daily as needed. 05/23/19   Dorena Dew, FNP       Exam Gen lethargic, awakens to loud voice, tries to answer questions, perseverates quite a bit answering "[place] , [place]" to different questions, not oriented to time , place or year, cannot answer most questions, nonfocal moving all extretmiteis No rash, cyanosis or gangrene Sclera anicteric, throat clear and slightly  dry No jvd or bruits, flat neck veins Chest clear bilat to bases, no rales or bronch bs RRR no MRG Abd soft ntnd no mass or ascites +bs GU defer MS no joint effusions or deformity, R great toe sp amputation Ext no leg or UE edema, no wounds or ulcers Neuro is as above    Home meds:  - norvasc 10/ lasix 20  - lipitor 10  - dulaglutide 0.61m weekly/ insulin glargine 45u hs  - prilosec 20/ nystatin / zyrtec prn  - tylenol #3 qid prn/ neurontin 300 bid/ voltaren 75 bid/ mobic prn  - prn's/ vitamins/ supplements    CXR - IMPRESSION: Low lung volumes with mild bibasilar atelectasis. Mild bibasilar infiltrates cannot be excluded.   CT head - negative   Xray ribs R - IMPRESSION: Findings suspicious for nondisplaced fractures involving the posterior right ninth and eighth ribs. No pneumothorax.    Xray R tib/ fib -  IMPRESSION: Negative.      Na 124  K 6.9  CO2 19  BUN 28  Cr 2.22  Ca 8.9  Alb 3.5  LFT's ok      WBC 7K  Hb 12  plt 198   Beta hydroxybutyrate = 4.78 ^, blu 870     UA >50 wbc, rare bact, 0-5 rbc, 20 ketone, pH 5.0      Baseline creat from 2021 = 1.6- 2.1, eGFR  28- 38 ml/min   Assessment/ Plan: 1. DM2 w/ DKA -  BS 870 on presentation. Low grade temp 99.1 here, wbc wnl. Possible mild basilar infiltrates on CXR.  Given empiric IV abx (roceph, azithro IV) in ED, will continue. DKA IV insulin protocol underway, will continue.  Has room for volume on exam. Got 2.5 L bolus in ED and getting LR at 125 cc/hr, will change to NS (K+ in LR) 2. Hyperkalemia - minimal EKG changes, given temporizing ED measures. Should correct well w/ IV insulin infusion as well. Check serial labs today.  3. AMS - lethargic and confused upon awakening. ?HOH. Should improve w/ Rx of DKA and dehydration/ infection. Admit to progressive.  4. AKI on CKD 3b - creat not far up from baseline, just a bit. prob d/t dehydration from CKA episode. Cont IVF's. F/u lab in am.  5. Fever - CAP vs UTI. + pyuria , + mild poss basilar CXR changes. Temp 99.1,  cont IV abx w/ roceph/ azithro IV.  6. HTN - will not be able to swallow pills until more alert, start IV metoprolol 5 mg qid and prn IV's for high BP.  7. PAD - hx toe amp on R foot      RKelly Splinter MD 04/10/2020, 1:36 PM

## 2020-04-10 NOTE — ED Triage Notes (Signed)
Pt dx with URI and broken ribs on Monday at [facility], and family reports that since that appointment she has been altered. CBG reading "high" for EMS.

## 2020-04-11 ENCOUNTER — Encounter (HOSPITAL_COMMUNITY): Payer: Self-pay | Admitting: Nephrology

## 2020-04-11 DIAGNOSIS — N390 Urinary tract infection, site not specified: Secondary | ICD-10-CM | POA: Diagnosis present

## 2020-04-11 DIAGNOSIS — G9341 Metabolic encephalopathy: Secondary | ICD-10-CM | POA: Diagnosis present

## 2020-04-11 LAB — BASIC METABOLIC PANEL
Anion gap: 10 (ref 5–15)
Anion gap: 11 (ref 5–15)
Anion gap: 9 (ref 5–15)
BUN: 27 mg/dL — ABNORMAL HIGH (ref 8–23)
BUN: 25 mg/dL — ABNORMAL HIGH (ref 8–23)
BUN: 25 mg/dL — ABNORMAL HIGH (ref 8–23)
CO2: 27 mmol/L (ref 22–32)
CO2: 27 mmol/L (ref 22–32)
CO2: 26 mmol/L (ref 22–32)
Calcium: 9.3 mg/dL (ref 8.9–10.3)
Calcium: 9.3 mg/dL (ref 8.9–10.3)
Calcium: 9.1 mg/dL (ref 8.9–10.3)
Chloride: 102 mmol/L (ref 98–111)
Chloride: 101 mmol/L (ref 98–111)
Chloride: 104 mmol/L (ref 98–111)
Creatinine, Ser: 1.79 mg/dL — ABNORMAL HIGH (ref 0.44–1.00)
Creatinine, Ser: 1.88 mg/dL — ABNORMAL HIGH (ref 0.44–1.00)
Creatinine, Ser: 1.83 mg/dL — ABNORMAL HIGH (ref 0.44–1.00)
GFR, Estimated: 31 mL/min — ABNORMAL LOW (ref >60)
GFR, Estimated: 29 mL/min — ABNORMAL LOW (ref >60)
GFR, Estimated: 30 mL/min — ABNORMAL LOW (ref >60)
Glucose, Bld: 147 mg/dL — ABNORMAL HIGH (ref 70–99)
Glucose, Bld: 160 mg/dL — ABNORMAL HIGH (ref 70–99)
Glucose, Bld: 127 mg/dL — ABNORMAL HIGH (ref 70–99)
Potassium: 4.1 mmol/L (ref 3.5–5.1)
Potassium: 4 mmol/L (ref 3.5–5.1)
Potassium: 4 mmol/L (ref 3.5–5.1)
Sodium: 139 mmol/L (ref 135–145)
Sodium: 139 mmol/L (ref 135–145)
Sodium: 139 mmol/L (ref 135–145)

## 2020-04-11 LAB — CBG MONITORING, ED
Glucose-Capillary: 143 mg/dL — ABNORMAL HIGH (ref 70–99)
Glucose-Capillary: 153 mg/dL — ABNORMAL HIGH (ref 70–99)
Glucose-Capillary: 156 mg/dL — ABNORMAL HIGH (ref 70–99)
Glucose-Capillary: 158 mg/dL — ABNORMAL HIGH (ref 70–99)
Glucose-Capillary: 163 mg/dL — ABNORMAL HIGH (ref 70–99)
Glucose-Capillary: 183 mg/dL — ABNORMAL HIGH (ref 70–99)
Glucose-Capillary: 243 mg/dL — ABNORMAL HIGH (ref 70–99)

## 2020-04-11 LAB — BETA-HYDROXYBUTYRIC ACID
Beta-Hydroxybutyric Acid: 0.34 mmol/L — ABNORMAL HIGH (ref 0.05–0.27)
Beta-Hydroxybutyric Acid: 0.19 mmol/L (ref 0.05–0.27)

## 2020-04-11 LAB — URINE CULTURE: Culture: NO GROWTH

## 2020-04-11 LAB — GLUCOSE, CAPILLARY
Glucose-Capillary: 188 mg/dL — ABNORMAL HIGH (ref 70–99)
Glucose-Capillary: 164 mg/dL — ABNORMAL HIGH (ref 70–99)

## 2020-04-11 MED ORDER — PANTOPRAZOLE SODIUM 40 MG PO TBEC
40.0000 mg | DELAYED_RELEASE_TABLET | Freq: Every day | ORAL | Status: DC
  Administered 2020-04-11 – 2020-04-16 (×6): 40 mg via ORAL
  Filled 2020-04-11 (×6): qty 1

## 2020-04-11 MED ORDER — INSULIN ASPART 100 UNIT/ML ~~LOC~~ SOLN
0.0000 [IU] | Freq: Three times a day (TID) | SUBCUTANEOUS | Status: DC
  Administered 2020-04-11: 2 [IU] via SUBCUTANEOUS
  Administered 2020-04-11: 3 [IU] via SUBCUTANEOUS
  Administered 2020-04-12: 1 [IU] via SUBCUTANEOUS
  Administered 2020-04-12: 2 [IU] via SUBCUTANEOUS
  Administered 2020-04-12: 5 [IU] via SUBCUTANEOUS
  Administered 2020-04-13 (×3): 2 [IU] via SUBCUTANEOUS
  Administered 2020-04-14 (×2): 5 [IU] via SUBCUTANEOUS
  Administered 2020-04-15 – 2020-04-16 (×3): 2 [IU] via SUBCUTANEOUS

## 2020-04-11 MED ORDER — INSULIN ASPART 100 UNIT/ML ~~LOC~~ SOLN
0.0000 [IU] | Freq: Every day | SUBCUTANEOUS | Status: DC
  Administered 2020-04-12: 4 [IU] via SUBCUTANEOUS
  Administered 2020-04-13 – 2020-04-14 (×2): 3 [IU] via SUBCUTANEOUS

## 2020-04-11 MED ORDER — INSULIN GLARGINE 100 UNIT/ML ~~LOC~~ SOLN
20.0000 [IU] | Freq: Two times a day (BID) | SUBCUTANEOUS | Status: DC
  Administered 2020-04-11 – 2020-04-13 (×5): 20 [IU] via SUBCUTANEOUS
  Filled 2020-04-11 (×6): qty 0.2

## 2020-04-11 NOTE — Progress Notes (Signed)
PROGRESS NOTE    Rachel Gonzalez  EML:[id number] DOB: [birth year] DOA: 04/10/2020 PCP: Massie Maroon, FNP   Brief Narrative:  [name] is a 70 year old lady with a history of type 2 diabetes mellitus on insulin, CKD stage IIIb, hypertension who initially presented to ED on 04/07/2020 due to fall and cold symptoms for more than 2 weeks.  She refused Covid test then.  X-ray showed nondisplaced fracture right eighth and ninth rib.  She was discharged home.  She was brought back in to the ED on 04/10/2020 by ambulance called by her husband due to altered mental status.  She was found to have DKA as well as hyperkalemia.  She was admitted to hospital service.  Assessment & Plan:   Principal Problem:   DKA (diabetic ketoacidosis) (HCC) Active Problems:   Essential hypertension   Hyperkalemia   Acute renal failure superimposed on stage 3 chronic kidney disease (HCC)   CAP (community acquired pneumonia)   Acute metabolic encephalopathy   Acute lower UTI  DKA in patient with type 2 diabetes mellitus: Her DKA has resolved, anion gap is closed.  Blood sugar around 150.  Her insulin drip as well as IV fluids were discontinued before my shift started today.  It appears that she was taking 45 units of Lantus at home.  I have started her on 20 units of Lantus twice daily and SSI.  Per husband, she was drinking a lot of juice at home before coming to the hospital.  Checking hemoglobin A1c.  Acute metabolic encephalopathy: Per H&P, she was significantly altered at the time of presentation however currently she is alert and oriented x2.  She was able to tell me that she is in the hospital and current month is February but she missed the current year and the POTUS.  Discussed with husband over the phone, he tells me that this is not usual for her.  Typically she is fully alert and oriented.  She also seems to have possible UTI.  Per husband, she had not complained of urinary symptoms but patient is poor  historian.  We will continue Rocephin and follow cultures and tailor antibiotics.  CT head negative.  No focal deficit on exam.  Hyperkalemia: Resolved.  AKI on CKD stage IIIb: Her baseline creatinine seems to be around 1.8.  She presented with 2.2.  Likely due to dehydration induced by polyuria and DKA.  Now back to baseline.  Essential hypertension: Does not seem to be on any medications.  Blood pressure very well controlled.  No need of IV Lopressor.  We will discontinue that.  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  Not on any antiplatelets.  DVT prophylaxis: enoxaparin (LOVENOX) injection 30 mg Start: 04/10/20 1600   Code Status: Full Code  Family Communication: None present at bedside.  Lengthy discussion with husband over the phone.  Answered several questions.  Status is: Inpatient  Remains inpatient appropriate because:Inpatient level of care appropriate due to severity of illness   Dispo: The patient is from: Home              Anticipated d/c is to: Home              Anticipated d/c date is: 1 day              Patient currently is not medically stable to d/c.   Difficult to place patient No        Estimated body mass index is  38.62 kg/m as calculated from the following:   Height as of this encounter: 5\' 4"  (1.626 m).   Weight as of this encounter: 102.1 kg.      Nutritional status:               Consultants:   None  Procedures:   None  Antimicrobials:  Anti-infectives (From admission, onward)   Start     Dose/Rate Route Frequency Ordered Stop   04/11/20 1000  cefTRIAXone (ROCEPHIN) 1 g in sodium chloride 0.9 % 100 mL IVPB        1 g 200 mL/hr over 30 Minutes Intravenous Every 24 hours 04/10/20 1548 04/15/20 0959   04/11/20 1000  azithromycin (ZITHROMAX) 500 mg in sodium chloride 0.9 % 250 mL IVPB        500 mg 250 mL/hr over 60 Minutes Intravenous Every 24 hours 04/10/20 1548 04/15/20 0959   04/10/20 1330  cefTRIAXone (ROCEPHIN) 1 g  in sodium chloride 0.9 % 100 mL IVPB        1 g 200 mL/hr over 30 Minutes Intravenous  Once 04/10/20 1318 04/10/20 1835   04/10/20 1330  azithromycin (ZITHROMAX) 500 mg in sodium chloride 0.9 % 250 mL IVPB        500 mg 250 mL/hr over 60 Minutes Intravenous  Once 04/10/20 1318 04/10/20 2100         Subjective: Seen and examined, still in the ED.  She is alert and oriented x2.  Denies any complaint.  Objective: Vitals:   04/11/20 0815 04/11/20 0830 04/11/20 0845 04/11/20 0900  BP:    140/72  Pulse: 60 67 62 62  Resp: (!) 24 15 20  (!) 28  Temp:      TempSrc:      SpO2: 98% 95% 97% 96%  Weight:      Height:        Intake/Output Summary (Last 24 hours) at 04/11/2020 1122 Last data filed at 04/11/2020 0718 Gross per 24 hour  Intake 3859.08 ml  Output --  Net 3859.08 ml   Filed Weights   04/10/20 1300  Weight: 102.1 kg    Examination:  General exam: Appears calm and comfortable  Respiratory system: Clear to auscultation. Respiratory effort normal. Cardiovascular system: S1 & S2 heard, RRR. No JVD, murmurs, rubs, gallops or clicks. No pedal edema. Gastrointestinal system: Abdomen is nondistended, soft and nontender. No organomegaly or masses felt. Normal bowel sounds heard. Central nervous system: Alert and oriented. No focal neurological deficits. Extremities: Symmetric 5 x 5 power. Skin: No rashes, lesions or ulcers Psychiatry: Judgement and insight appear poor, mood and affect flat.   Data Reviewed: I have personally reviewed following labs and imaging studies  CBC: Recent Labs  Lab 04/10/20 1119 04/10/20 1140 04/10/20 1720  WBC 7.8  --  8.0  NEUTROABS 6.4  --   --   HGB 12.5 14.6 11.5*  HCT 41.4 43.0 35.4*  MCV 86.8  --  83.1  PLT 198  --  188   Basic Metabolic Panel: Recent Labs  Lab 04/10/20 1119 04/10/20 1140 04/10/20 1720 04/11/20 0026 04/11/20 0345 04/11/20 0827  NA 124* 124* 131* 139 139 139  K 6.9* 7.0* 5.4* 4.1 4.0 4.0  CL 88*  --  93* 102  101 104  CO2 19*  --  22 27 27 26   GLUCOSE 870*  --  591* 147* 160* 127*  BUN 28*  --  29* 27* 25* 25*  CREATININE 2.22*  --  1.88* 1.79* 1.88* 1.83*  CALCIUM 8.9  --  8.8* 9.3 9.3 9.1  PHOS  --   --  3.4  --   --   --    GFR: Estimated Creatinine Clearance: 34.2 mL/min (A) (by C-G formula based on SCr of 1.83 mg/dL (H)). Liver Function Tests: Recent Labs  Lab 04/10/20 1119  AST 13*  ALT 12  ALKPHOS 239*  BILITOT 1.2  PROT 7.2  ALBUMIN 3.5   No results for input(s): LIPASE, AMYLASE in the last 168 hours. Recent Labs  Lab 04/10/20 1120  AMMONIA 13   Coagulation Profile: No results for input(s): INR, PROTIME in the last 168 hours. Cardiac Enzymes: No results for input(s): CKTOTAL, CKMB, CKMBINDEX, TROPONINI in the last 168 hours. BNP (last 3 results) No results for input(s): PROBNP in the last 8760 hours. HbA1C: No results for input(s): HGBA1C in the last 72 hours. CBG: Recent Labs  Lab 04/11/20 0138 04/11/20 0245 04/11/20 0349 04/11/20 0455 04/11/20 0647  GLUCAP 183* 156* 163* 158* 153*   Lipid Profile: No results for input(s): CHOL, HDL, LDLCALC, TRIG, CHOLHDL, LDLDIRECT in the last 72 hours. Thyroid Function Tests: Recent Labs    04/10/20 1119  TSH 0.838   Anemia Panel: No results for input(s): VITAMINB12, FOLATE, FERRITIN, TIBC, IRON, RETICCTPCT in the last 72 hours. Sepsis Labs: No results for input(s): PROCALCITON, LATICACIDVEN in the last 168 hours.  Recent Results (from the past 240 hour(s))  SARS Coronavirus 2 by RT PCR (hospital order, performed in [facility] hospital lab) Nasopharyngeal Nasopharyngeal Swab     Status: None   Collection Time: 04/10/20 11:20 AM   Specimen: Nasopharyngeal Swab  Result Value Ref Range Status   SARS Coronavirus 2 NEGATIVE NEGATIVE Final    Comment: (NOTE) SARS-CoV-2 target nucleic acids are NOT DETECTED.  The SARS-CoV-2 RNA is generally detectable in upper and lower respiratory specimens during the acute phase  of infection. The lowest concentration of SARS-CoV-2 viral copies this assay can detect is 250 copies / mL. A negative result does not preclude SARS-CoV-2 infection and should not be used as the sole basis for treatment or other patient management decisions.  A negative result may occur with improper specimen collection / handling, submission of specimen other than nasopharyngeal swab, presence of viral mutation(s) within the areas targeted by this assay, and inadequate number of viral copies (<250 copies / mL). A negative result must be combined with clinical observations, patient history, and epidemiological information.  Fact Sheet for Patients:   BoilerBrush.com.cy  Fact Sheet for Healthcare Providers: https://pope.com/  This test is not yet approved or  cleared by the [place] FDA and has been authorized for detection and/or diagnosis of SARS-CoV-2 by FDA under an Emergency Use Authorization (EUA).  This EUA will remain in effect (meaning this test can be used) for the duration of the COVID-19 declaration under Section 564(b)(1) of the Act, 21 U.S.C. section 360bbb-3(b)(1), unless the authorization is terminated or revoked sooner.  Performed at [facility], [address] [place], [name] [address]   Urine culture     Status: None   Collection Time: 04/10/20 12:12 PM   Specimen: Urine, Catheterized  Result Value Ref Range Status   Specimen Description URINE, CATHETERIZED  Final   Special Requests NONE  Final   Culture   Final    NO GROWTH Performed at [facility], [address] [place], [name] [address]    Report Status 04/11/2020 FINAL  Final  Culture, blood (routine x 2)     Status: None (Preliminary result)   Collection Time: 04/10/20  1:19 PM   Specimen: BLOOD  Result Value Ref Range Status   Specimen Description BLOOD RIGHT ANTECUBITAL  Final   Special Requests   Final    BOTTLES DRAWN AEROBIC  AND ANAEROBIC Blood Culture adequate volume   Culture   Final    NO GROWTH < 24 HOURS Performed at [facility], [address], [place], [name] [address]    Report Status PENDING  Incomplete  Culture, blood (routine x 2)     Status: None (Preliminary result)   Collection Time: 04/10/20  3:05 PM   Specimen: BLOOD RIGHT WRIST  Result Value Ref Range Status   Specimen Description BLOOD RIGHT WRIST  Final   Special Requests   Final    BOTTLES DRAWN AEROBIC AND ANAEROBIC Blood Culture results may not be optimal due to an inadequate volume of blood received in culture bottles   Culture   Final    NO GROWTH < 24 HOURS Performed at [facility], [address], [place], [name] [address]    Report Status PENDING  Incomplete      Radiology Studies: DG Chest 1 View  Result Date: 04/10/2020 CLINICAL DATA:  Altered mental status. EXAM: CHEST  1 VIEW COMPARISON:  Chest x-ray 04/07/2020. FINDINGS: Mediastinum and hilar structures normal. Low lung volumes with mild bibasilar atelectasis. Mild bibasilar infiltrates cannot be excluded. No pleural effusion or pneumothorax. Heart size normal. Right lower rib fractures best identified by prior rib series. IMPRESSION: Low lung volumes with mild bibasilar atelectasis. Mild bibasilar infiltrates cannot be excluded. Electronically Signed   By: Maisie Fus  Register   On: 04/10/2020 11:49   CT Head Wo Contrast  Result Date: 04/10/2020 CLINICAL DATA:  Head injury, altered mental status EXAM: CT HEAD WITHOUT CONTRAST TECHNIQUE: Contiguous axial images were obtained from the base of the skull through the vertex without intravenous contrast. COMPARISON:  03/11/2014 FINDINGS: Brain: Normal anatomic configuration. Mild parenchymal volume loss is commensurate with the patient's age. No abnormal intra or extra-axial mass lesion or fluid collection. No abnormal mass effect or midline shift. No evidence of acute intracranial hemorrhage or infarct. Ventricular size  is normal. Cerebellum unremarkable. Vascular: Unremarkable Skull: No acute fracture. Sinuses/Orbits: Paranasal sinuses are clear. Orbits are unremarkable. Other: Mastoid air cells and middle ear cavities are clear. IMPRESSION: No acute intracranial abnormality.  Mild senescent change. Electronically Signed   By: Helyn Numbers MD   On: 04/10/2020 12:02    Scheduled Meds: . enoxaparin (LOVENOX) injection  30 mg Subcutaneous Q24H  . insulin aspart  0-5 Units Subcutaneous QHS  . insulin aspart  0-9 Units Subcutaneous TID WC  . insulin glargine  20 Units Subcutaneous BID  . metoprolol tartrate  5 mg Intravenous Q6H  . sodium chloride flush  10-40 mL Intracatheter Q12H   Continuous Infusions: . sodium chloride Stopped (04/10/20 1804)  . azithromycin    . cefTRIAXone (ROCEPHIN)  IV    . dextrose 5 % and 0.45% NaCl Stopped (04/10/20 1650)     LOS: 1 day   Time spent: 39 minutes   Hughie Closs, MD [facility]  04/11/2020, 11:22 AM   To contact the attending provider between 7A-7P or the covering provider during after hours 7P-7A, please log into the web site [email or web address].

## 2020-04-11 NOTE — Evaluation (Signed)
Physical Therapy Evaluation Patient Details Name: Rachel Gonzalez MRN: [id number] DOB: [birth year] Today's Date: 04/11/2020   History of Present Illness  Pt is a 70 year old lady with a history of type 2 diabetes mellitus on insulin, CKD stage IIIb, hypertension who initially presented to ED on 04/07/2020 due to fall and cold symptoms for more than 2 weeks.  She refused Covid test then.  X-ray showed nondisplaced fracture right eighth and ninth rib.  She was discharged home.  She was brought back in to the ED on 04/10/2020 by ambulance called by her husband due to altered mental status.  She was found to have DKA as well as hyperkalemia.  Clinical Impression   Pt admitted with above diagnosis. At baseline, pt resides at home and is fairly independent.  She lives with her spouse who works.  Today, pt very confused, extremely slow to respond, and decreased initiation.  She required min A with max cues for sequencing for all transfers/ambulation.  Pt requiring min A for balance with knees buckling with ambulation.  Pt with recent history of falls.  At this time recommend SNF as pt is fall risk , needs skilled therapy, and does not have daytime support.  Pt does have good rehab potential - could potentially progress to home level. Pt currently with functional limitations due to the deficits listed below (see PT Problem List). Pt will benefit from skilled PT to increase their independence and safety with mobility to allow discharge to the venue listed below.       Follow Up Recommendations SNF    Equipment Recommendations  Rolling walker with 5" wheels    Recommendations for Other Services       Precautions / Restrictions Precautions Precautions: Fall      Mobility  Bed Mobility Overal bed mobility: Needs Assistance Bed Mobility: Supine to Sit;Sit to Supine     Supine to sit: Min assist;HOB elevated Sit to supine: Min assist;HOB elevated   General bed mobility comments: Increased time with  cues for each step (slide leg off, reach across, sit up)    Transfers Overall transfer level: Needs assistance Equipment used: Rolling walker (2 wheeled) Transfers: Sit to/from Stand Sit to Stand: Min assist         General transfer comment: Min A to rise from elevated stretcher - cues for safe hand placement and cues for sequencing  Ambulation/Gait Ambulation/Gait assistance: Min assist Gait Distance (Feet): 25 Feet Assistive device: Rolling walker (2 wheeled) Gait Pattern/deviations: Step-to pattern;Decreased stride length;Trunk flexed Gait velocity: decreased   General Gait Details: Unsteady with episodes of knees buckling initially ; required min A for balance; cues for RW; limited by fatigue and needing to go to bathroom  Stairs            Wheelchair Mobility    Modified Rankin (Stroke Patients Only)       Balance Overall balance assessment: Needs assistance Sitting-balance support: Bilateral upper extremity supported Sitting balance-Leahy Scale: Poor Sitting balance - Comments: required UE support; slight posterior leand   Standing balance support: Bilateral upper extremity supported Standing balance-Leahy Scale: Poor Standing balance comment: required RW                             Pertinent Vitals/Pain Pain Assessment: No/denies pain    Home Living Family/patient expects to be discharged to:: Private residence Living Arrangements: Spouse/significant other Available Help at Discharge: Family;Available PRN/intermittently (spouse works) Type of Home:  House Home Access: Stairs to enter Entrance Stairs-Rails: Doctor, general practice of Steps: 3 Home Layout: One level Home Equipment: Cane - single point      Prior Function     Gait / Transfers Assistance Needed: Reports able to ambulate in community without AD; Reports 2-3 falls in a year  ADL's / Homemaking Assistance Needed: Reports independent with ADLS  Comments: Spoke  with spouse regarding PLOF>  Pt normally cognitively intact and independent but recent hx of falls.     Hand Dominance        Extremity/Trunk Assessment   Upper Extremity Assessment Upper Extremity Assessment: Generalized weakness    Lower Extremity Assessment Lower Extremity Assessment: Generalized weakness;Difficult to assess due to impaired cognition (Demonstrating 3/5 but not following furhter MMT commands)    Cervical / Trunk Assessment Cervical / Trunk Assessment: Normal  Communication   Communication: No difficulties  Cognition Arousal/Alertness: Awake/alert Behavior During Therapy: WFL for tasks assessed/performed Overall Cognitive Status: Impaired/Different from baseline Area of Impairment: Orientation;Awareness;Attention;Problem solving;Following commands;Safety/judgement                 Orientation Level: Disoriented to;Time;Situation Current Attention Level: Sustained   Following Commands: Follows one step commands with increased time Safety/Judgement: Decreased awareness of safety;Decreased awareness of deficits Awareness: Intellectual Problem Solving: Decreased initiation;Slow processing;Requires verbal cues;Difficulty sequencing;Requires tactile cues General Comments: Pt very slow to respond.  She was unable to state the President, reason for mask (COVID), count up by 3's, and only able to name 1 animal that started with letter C when asked for 3.      General Comments General comments (skin integrity, edema, etc.): VSS.  Pt reports needs to use bathroom - she was being seen in ED and nearest restroom too far, no BSC in room, tried to get back in bed for P[place]ICK but unable to hold and pt urinated in floor.  Assisted pt with cleaning herself, pt returned to bed, PT cleaned floor.    Exercises     Assessment/Plan    PT Assessment Patient needs continued PT services  PT Problem List Decreased strength;Decreased mobility;Decreased safety  awareness;Decreased activity tolerance;Decreased cognition;Decreased balance;Decreased knowledge of use of DME;Decreased coordination       PT Treatment Interventions DME instruction;Therapeutic activities;Gait training;Patient/family education;Therapeutic exercise;Balance training;Functional mobility training;Stair training    PT Goals (Current goals can be found in the Care Plan section)  Acute Rehab PT Goals Patient Stated Goal: spouse open to whatever pt needs at d/c SNF vs HH - but cannot provide 24 hr support as he works PT Goal Formulation: With patient/family Time For Goal Achievement: 04/25/20 Potential to Achieve Goals: Good    Frequency Min 3X/week   Barriers to discharge Decreased caregiver support      Co-evaluation               AM-PAC PT "6 Clicks" Mobility  Outcome Measure Help needed turning from your back to your side while in a flat bed without using bedrails?: A Little Help needed moving from lying on your back to sitting on the side of a flat bed without using bedrails?: A Little Help needed moving to and from a bed to a chair (including a wheelchair)?: A Little Help needed standing up from a chair using your arms (e.g., wheelchair or bedside chair)?: A Little Help needed to walk in hospital room?: A Little Help needed climbing 3-5 steps with a railing? : A Lot 6 Click Score: 17    End of Session  Equipment Utilized During Treatment: Gait belt Activity Tolerance: Patient tolerated treatment well Patient left: in bed;with call bell/phone within reach (on stretcher) Nurse Communication: Mobility status PT Visit Diagnosis: Unsteadiness on feet (R26.81);Repeated falls (R29.6);Muscle weakness (generalized) (M62.81)    Time: 1120-1150 PT Time Calculation (min) (ACUTE ONLY): 30 min   Charges:   PT Evaluation $PT Eval Moderate Complexity: 1 Mod PT Treatments $Gait Training: 8-22 mins        Anise Salvo, PT Acute Rehab Services Pager [phone] [facility] [phone]    Rayetta Humphrey 04/11/2020, 2:10 PM

## 2020-04-11 NOTE — Progress Notes (Signed)
Inpatient Diabetes Program Recommendations  AACE/ADA: New Consensus Statement on Inpatient Glycemic Control (2015)  Target Ranges:  Prepandial:   less than 140 mg/dL      Peak postprandial:   less than 180 mg/dL (1-2 hours)      Critically ill patients:  140 - 180 mg/dL   Lab Results  Component Value Date   GLUCAP 153 (H) 04/11/2020   HGBA1C 12.9 (A) 02/27/2020    Review of Glycemic Control Results for MEOSHA, CASTANON (MRN [id number]) as of 04/11/2020 11:58  Ref. Range 04/11/2020 01:38 04/11/2020 02:45 04/11/2020 03:49 04/11/2020 04:55 04/11/2020 06:47  Glucose-Capillary Latest Ref Range: 70 - 99 mg/dL 330 (H) 076 (H) 226 (H) 158 (H) 153 (H)   Diabetes history: DM 2 Outpatient Diabetes medications:  Trulicity 0.75 mg weekly, Lantus 45 units q HS Current orders for Inpatient glycemic control:  Novolog sensitive tid with meals and HS Lantus 20 units bid Inpatient Diabetes Program Recommendations:   Note A1C in December 2021 was 12.9%.  Attempted to speak with patient however patient confused at this time.  Will follow.   Thanks,  Beryl Meager, RN, BC-ADM Inpatient Diabetes Coordinator Pager [phone] (8a-5p)

## 2020-04-11 NOTE — ED Notes (Signed)
Sent add on slip for A1C

## 2020-04-12 LAB — CBC WITH DIFFERENTIAL/PLATELET
Abs Immature Granulocytes: 0.02 10*3/uL (ref 0.00–0.07)
Basophils Absolute: 0.1 10*3/uL (ref 0.0–0.1)
Basophils Relative: 1 %
Eosinophils Absolute: 0.2 10*3/uL (ref 0.0–0.5)
Eosinophils Relative: 2 %
HCT: 32.5 % — ABNORMAL LOW (ref 36.0–46.0)
Hemoglobin: 10.6 g/dL — ABNORMAL LOW (ref 12.0–15.0)
Immature Granulocytes: 0 %
Lymphocytes Relative: 46 %
Lymphs Abs: 2.8 10*3/uL (ref 0.7–4.0)
MCH: 27.1 pg (ref 26.0–34.0)
MCHC: 32.6 g/dL (ref 30.0–36.0)
MCV: 83.1 fL (ref 80.0–100.0)
Monocytes Absolute: 0.5 10*3/uL (ref 0.1–1.0)
Monocytes Relative: 7 %
Neutro Abs: 2.8 10*3/uL (ref 1.7–7.7)
Neutrophils Relative %: 44 %
Platelets: 165 10*3/uL (ref 150–400)
RBC: 3.91 MIL/uL (ref 3.87–5.11)
RDW: 13.1 % (ref 11.5–15.5)
WBC: 6.3 10*3/uL (ref 4.0–10.5)
nRBC: 0 % (ref 0.0–0.2)

## 2020-04-12 LAB — COMPREHENSIVE METABOLIC PANEL
ALT: 10 U/L (ref 0–44)
AST: 14 U/L — ABNORMAL LOW (ref 15–41)
Albumin: 2.6 g/dL — ABNORMAL LOW (ref 3.5–5.0)
Alkaline Phosphatase: 142 U/L — ABNORMAL HIGH (ref 38–126)
Anion gap: 10 (ref 5–15)
BUN: 23 mg/dL (ref 8–23)
CO2: 28 mmol/L (ref 22–32)
Calcium: 8.8 mg/dL — ABNORMAL LOW (ref 8.9–10.3)
Chloride: 104 mmol/L (ref 98–111)
Creatinine, Ser: 1.77 mg/dL — ABNORMAL HIGH (ref 0.44–1.00)
GFR, Estimated: 31 mL/min — ABNORMAL LOW (ref >60)
Glucose, Bld: 143 mg/dL — ABNORMAL HIGH (ref 70–99)
Potassium: 3.9 mmol/L (ref 3.5–5.1)
Sodium: 142 mmol/L (ref 135–145)
Total Bilirubin: 0.5 mg/dL (ref 0.3–1.2)
Total Protein: 5.6 g/dL — ABNORMAL LOW (ref 6.5–8.1)

## 2020-04-12 LAB — HEMOGLOBIN A1C
Hgb A1c MFr Bld: >15.5 % — ABNORMAL HIGH (ref 4.8–5.6)
Mean Plasma Glucose: >398 mg/dL

## 2020-04-12 LAB — GLUCOSE, CAPILLARY
Glucose-Capillary: 135 mg/dL — ABNORMAL HIGH (ref 70–99)
Glucose-Capillary: 131 mg/dL — ABNORMAL HIGH (ref 70–99)
Glucose-Capillary: 176 mg/dL — ABNORMAL HIGH (ref 70–99)
Glucose-Capillary: 262 mg/dL — ABNORMAL HIGH (ref 70–99)
Glucose-Capillary: 312 mg/dL — ABNORMAL HIGH (ref 70–99)

## 2020-04-12 MED ORDER — LISINOPRIL 5 MG PO TABS
5.0000 mg | ORAL_TABLET | Freq: Every day | ORAL | Status: DC
  Administered 2020-04-12 – 2020-04-16 (×5): 5 mg via ORAL
  Filled 2020-04-12 (×5): qty 1

## 2020-04-12 MED ORDER — LIVING WELL WITH DIABETES BOOK
Freq: Once | Status: AC
  Filled 2020-04-12: qty 1

## 2020-04-12 MED ORDER — HYDRALAZINE HCL 20 MG/ML IJ SOLN: 10.0000 mg | Freq: Four times a day (QID) | INTRAMUSCULAR | Status: DC | PRN

## 2020-04-12 NOTE — Evaluation (Signed)
Occupational Therapy Evaluation Patient Details Name: Rachel Gonzalez MRN: [id number] DOB: 23-Apr-1950 Today's Date: 04/12/2020    History of Present Illness Pt is a 70 year old lady with a history of type 2 diabetes mellitus on insulin, CKD stage IIIb, hypertension who initially presented to ED on 04/07/2020 due to fall and cold symptoms for more than 2 weeks.  She refused Covid test then.  X-ray showed nondisplaced fracture right eighth and ninth rib.  She was discharged home.  She was brought back in to the ED on 04/10/2020 by ambulance called by her husband due to altered mental status.  She was found to have DKA as well as hyperkalemia.   Clinical Impression   Pt admitted with the above diagnoses and presents with below problem list. Pt will benefit from continued acute OT to address the below listed deficits and maximize independence with basic ADLs prior to d/c to venue below. At baseline pt is independent with basic ADLs, endorses occasional use of cane. Pt presents with impaired cognition (see details below), generalized weakness and impaired balance impacting ADLs.  Pt is currently setup with UB ADLs, up to min A with LB ADLs and functional transfers to regular height seating, min guard from elevated seat height. Pt currently is more stable using rw. She appears to have improved in the past day of two but is not currently at baseline and not ready to d/c today. Feel that as her acute medical issues improve her ability to d/c home safely will improve. Of note, pt did experience 1x episode of dizziness upon standing from regular height toilet, cued to sit back down to recover prior to walking. Pt up in recliner at end of session, chair alarm on.     Follow Up Recommendations  Home health OT;Supervision/Assistance - 24 hour (at least initially) pending progress, otherwise may need SNF    Equipment Recommendations  3 in 1 bedside commode    Recommendations for Other Services       Precautions  / Restrictions Precautions Precautions: Fall Restrictions Weight Bearing Restrictions: No      Mobility Bed Mobility Overal bed mobility: Needs Assistance Bed Mobility: Supine to Sit     Supine to sit: Min guard     General bed mobility comments: Increased time to initiate but no physical assist needed. Min guard for safety.    Transfers Overall transfer level: Needs assistance Equipment used: Rolling walker (2 wheeled) Transfers: Sit to/from Stand Sit to Stand: Min guard;Min assist         General transfer comment: min guard to transfer to/from elevated seat. For lower seat position needed min A. Utilized rw.    Balance Overall balance assessment: Needs assistance Sitting-balance support: No upper extremity supported;Feet supported Sitting balance-Leahy Scale: Fair     Standing balance support: Bilateral upper extremity supported Standing balance-Leahy Scale: Poor Standing balance comment: required RW                           ADL either performed or assessed with clinical judgement   ADL Overall ADL's : Needs assistance/impaired Eating/Feeding: Set up;Sitting   Grooming: Min guard;Oral care;Wash/dry face;Wash/dry hands;Standing Grooming Details (indicate cue type and reason): single UE support on sink Upper Body Bathing: Set up;Sitting   Lower Body Bathing: Minimal assistance;Min guard;Sit to/from stand   Upper Body Dressing : Set up;Sitting   Lower Body Dressing: Min guard;Minimal assistance;Sit to/from stand   Toilet Transfer: Minimal assistance;Ambulation;Regular Toilet;Grab bars;RW  Toilet Transfer Details (indicate cue type and reason): min A to control descent and powerup from regular height toilet Toileting- Clothing Manipulation and Hygiene: Min guard;Sit to/from stand;Sitting/lateral lean;Minimal assistance   Tub/ Shower Transfer: Minimal assistance;Ambulation;Rolling walker;Shower seat   Functional mobility during ADLs: Min  guard;Rolling walker General ADL Comments: Pt received resting in bed. bed mobilty at min guard level to sit EOB. Pt walked to sink to complete 3 grooming tasks in standing at min guard level. Pt then walked to bathroom for toileting. Pt did struggle with regular height toilet. Upon standing pt noted to appear lightheaded, eyes closed and swaying. Cued pt to sit back down to recover prior to walking to recliner. Pt r/o dizziness with improved with rest and time. Pt up to recliner to eat breakfast at end of session.     Vision         Perception     Praxis      Pertinent Vitals/Pain Pain Assessment: No/denies pain     Hand Dominance Right   Extremity/Trunk Assessment Upper Extremity Assessment Upper Extremity Assessment: Generalized weakness   Lower Extremity Assessment Lower Extremity Assessment: Defer to PT evaluation       Communication Communication Communication: No difficulties   Cognition Arousal/Alertness: Awake/alert Behavior During Therapy: WFL for tasks assessed/performed Overall Cognitive Status: Impaired/Different from baseline Area of Impairment: Orientation;Attention;Memory;Following commands;Safety/judgement;Problem solving;Awareness                 Orientation Level: Time;Situation Current Attention Level: Sustained Memory: Decreased short-term memory Following Commands: Follows one step commands with increased time Safety/Judgement: Decreased awareness of safety;Decreased awareness of deficits Awareness: Emergent;Intellectual Problem Solving: Slow processing;Decreased initiation;Difficulty sequencing;Requires verbal cues General Comments: Pt able to state that it is Friday and February but twice stated the year as "2002" with cueing able to correct to "2022." Initially unable to state the president's name even with 1 cue. But at end of session with time able to state "[place]."   General Comments       Exercises     Shoulder Instructions       Home Living Family/patient expects to be discharged to:: Private residence Living Arrangements: Spouse/significant other Available Help at Discharge: Family;Available PRN/intermittently Type of Home: House Home Access: Stairs to enter [employer] of Steps: 3 Entrance Stairs-Rails: Right;Left Home Layout: One level     Bathroom Shower/Tub: Producer, television/film/video: Handicapped height     Home Equipment: Gilmer Mor - single point   Additional Comments: husband works days Tues-Sat, off Sun and Mon      Prior Functioning/Environment Level of Independence: Independent with assistive device(s)  Gait / Transfers Assistance Needed: Pt reports occasional use of cane. Reported 2-3 falls in a year on PT eval. ADL's / Homemaking Assistance Needed: Reports independent with ADLS   Comments: Per PT eval: spouse reports pt normally cognitively intact and independent but recent hx of falls.        OT Problem List: Decreased strength;Decreased activity tolerance;Impaired balance (sitting and/or standing);Decreased safety awareness;Decreased knowledge of use of DME or AE;Decreased knowledge of precautions;Pain      OT Treatment/Interventions: Self-care/ADL training;DME and/or AE instruction;Therapeutic activities;Cognitive remediation/compensation;Patient/family education;Balance training    OT Goals(Current goals can be found in the care plan section) Acute Rehab OT Goals Patient Stated Goal: not stated OT Goal Formulation: With patient Time For Goal Achievement: 04/26/20 Potential to Achieve Goals: Good ADL Goals Pt Will Perform Lower Body Bathing: with modified independence;sit to/from stand Pt Will Perform  Lower Body Dressing: with modified independence;sit to/from stand Pt Will Transfer to Toilet: with modified independence;ambulating;bedside commode Pt Will Perform Toileting - Clothing Manipulation and hygiene: with modified independence;sitting/lateral leans;sit  to/from stand Additional ADL Goal #1: Pt will independently answer 3/3 recall questions with 100% accuracy  OT Frequency: Min 2X/week   Barriers to D/C:    Per pt, spouse can provide 24 hr assist only on Sunday and Monday (his days off). Otherwise no assist available during the day.       Co-evaluation              AM-PAC OT "6 Clicks" Daily Activity     Outcome Measure Help from another person eating meals?: None Help from another person taking care of personal grooming?: None Help from another person toileting, which includes using toliet, bedpan, or urinal?: A Little Help from another person bathing (including washing, rinsing, drying)?: A Little Help from another person to put on and taking off regular upper body clothing?: None Help from another person to put on and taking off regular lower body clothing?: A Little 6 Click Score: 21   End of Session Equipment Utilized During Treatment: Rolling walker  Activity Tolerance: Other (comment) (dizzy upon standing from regular height surface otherwise tolerated OOB activity well.) Patient left: in chair;with call bell/phone within reach;with chair alarm set  OT Visit Diagnosis: Unsteadiness on feet (R26.81);Muscle weakness (generalized) (M62.81);History of falling (Z91.81);Other symptoms and signs involving cognitive function;Pain Pain - Right/Left: Left Pain - part of body:  (abdomen)                Time: 0277-4128 OT Time Calculation (min): 35 min Charges:  OT General Charges $OT Visit: 1 Visit OT Evaluation $OT Eval Moderate Complexity: 1 Mod OT Treatments $Self Care/Home Management : 8-22 mins  Raynald Kemp, OT Acute Rehabilitation Services Pager: [phone] Office: [phone]   Pilar Grammes 04/12/2020, 9:51 AM

## 2020-04-12 NOTE — Progress Notes (Signed)
Spoke with patient on the phone. States that she was diagnosed with diabetes about 30 years ago. States that she last visited her PCP in December. Patient takes Lantus 45 units at bedtime with insulin pen. She is not taking prescribed Trulicity since it was going to cost her $600. We discussed her HgbA1C of 12.9%. she states that she gives her insulin almost every day at home. She does not check blood sugars. Would really like to have the Rachel Gonzalez, Rachel Gonzalez continuous glucose monitor which can be prescribed by her PCP if her insurance will help pay.   States that she does drink regular soda at home. Suggested that she stop drinking those and change to diet drinks. Patient states that she gives her own insulin injections at home.   Physician can give patient the home blood glucose meter kit prescription (order # [id number]) at discharge. Will include [employer] information for the [employer] products for home blood glucose meter, strips, and lancets in her discharge reference papers.   Harvel Ricks RN BSN CDE Diabetes Coordinator Pager: [phone]  8am-5pm

## 2020-04-12 NOTE — Discharge Instructions (Signed)
Glucose Products:  [employer] glucose products raise low blood sugar fast. Tablets are free of fat, caffeine, sodium and gluten. They are portable and easy to carry, making it easier for people with diabetes to BE PREPARED for lows.  Glucose Tablets Available in 6 flavors . 10 ct...................................... $1.00 . 50 ct...................................... $3.98 Glucose Shot..................................$1.48 Glucose Gel....................................$3.44  Alcohol Swabs Alcohol swabs are used to sterilize your injection site. All of our swabs are individually wrapped for maximum safety, convenience and moisture retention. [employer] Alcohol Swabs . 100 ct Swabs..............................$1.00 . 400 ct Swabs..............................$3.74  Lancets [employer] offers three lancet options conveniently designed to work with almost every lancing device. Each features a protective disk, which guarantees sterility before testing. [employer] Lancets . 100 ct Lancets $1.56 . 200 ct Lancets $2.64 Available in Ultra-Thin, Thin & Micro-Thin [employer] 2-IN-1 Lancing Device . 50 ct Lancets..................................... $3.44 Available in 30 gauge and 25 gauge [employer]T Lancing Device....................$5.84  Blood Glucose Monitors [employer] offers a full range of blood glucose testing options to provide an accurate, affordable system that meets each person's unique needs and preferences. Prime Meter....................................... $9.00 Prime Test Strips . 25 test strips.................................... $5.00 . 50 test strips.................................... $9.00 . 100 test strips.................................$17.88 Premier BLU Meter  ............  $18.98 Premier Voice Meter  .............  $14.98 Premier Test Strips . 50 test strips.................................... $9.00 . 100 test strips.................................$17.88 Premier [employer]   ............  $19.44 Kit includes: . 50 test strips . 10 lancets . Lancing device . Carry case

## 2020-04-12 NOTE — NC FL2 (Signed)
[place] MEDICAID FL2 LEVEL OF CARE SCREENING TOOL     IDENTIFICATION  Patient Name: Rachel Gonzalez Birthdate: [birth year] Sex: female Admission Date (Current Location): 04/10/2020  [facility] and [place] Number:  Producer, television/film/video and Address:  [facility], [address] [place], [name] [address]      Provider Number: [id number]  Attending Physician Name and Address:  Hughie Closs, MD  Relative Name and Phone Number:  [place], [place] [phone]    Current Level of Care: Hospital Recommended Level of Care: Skilled Nursing Facility Prior Approval Number:    Date Approved/Denied:   PASRR Number: [id number]  Discharge Plan: SNF    Current Diagnoses: Patient Active Problem List   Diagnosis Date Noted  . Acute metabolic encephalopathy 04/11/2020  . Acute lower UTI 04/11/2020  . DKA (diabetic ketoacidosis) (HCC) 04/10/2020  . Altered mental status 04/10/2020  . CAP (community acquired pneumonia) 04/10/2020  . Pyuria 04/10/2020  . Stage 3 chronic kidney disease (HCC) 12/01/2016  . Frequent falls 04/28/2016  . Right arm weakness 04/28/2016  . Bilateral lower extremity edema 04/28/2016  . Screening for colon cancer 04/28/2016  . Abnormal urinalysis 09/13/2014  . Chest pain 09/13/2014  . Abdominal pain, recurrent 09/13/2014  . Dehydration with hyponatremia 09/13/2014  . Hyperkalemia 09/13/2014  . Acute renal failure superimposed on stage 3 chronic kidney disease (HCC) 09/13/2014  . Anemia 05/06/2014  . Diabetic neuropathy, type II diabetes mellitus (HCC) 05/06/2014  . Obesity (BMI 30-39.9) 05/06/2014  . Cellulitis of right foot 05/06/2014  . Essential hypertension 03/07/2011  . 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 Right arm pain 08/04/2006  . Osteoarthritis 02/14/2006  . Uncontrolled type 2 diabetes mellitus with hyperglycemia (HCC) 01/11/2006    Orientation RESPIRATION BLADDER Height & Weight      Self,Situation,Place  Normal Incontinent Weight: 225 lb (102.1 kg) Height:  5\' 4"  (162.6 cm)  BEHAVIORAL SYMPTOMS/MOOD NEUROLOGICAL BOWEL NUTRITION STATUS      Continent Diet (Carb modified.  See discharge summary)  AMBULATORY STATUS COMMUNICATION OF NEEDS Skin   Limited Assist Verbally Normal                       Personal Care Assistance Level of Assistance  Bathing,Feeding,Dressing Bathing Assistance: Limited assistance Feeding assistance: Independent Dressing Assistance: Limited assistance     Functional Limitations Info  Sight,Hearing,Speech Sight Info: Adequate Hearing Info: Adequate Speech Info: Adequate    SPECIAL CARE FACTORS FREQUENCY  PT (By licensed PT),OT (By licensed OT)     PT Frequency: 5x week OT Frequency: 5x week            Contractures Contractures Info: Not present    Additional Factors Info  Code Status,Allergies,Insulin Sliding Scale Code Status Info: full Allergies Info: Tramadol, Adhesive (Tape), Bactrim (Sulfamethoxazole-trimethoprim), Tylenol With Codeine #3 (Acetaminophen-codeine)   Insulin Sliding Scale Info: Novolog.  0-9 units 3x day with meals.  0-5 units at bedtime.  See discharge summary.       Current Medications (04/12/2020):  This is the current hospital active medication list Current Facility-Administered Medications  Medication Dose Route Frequency Provider Last Rate Last Admin  . 0.9 %  sodium chloride infusion   Intravenous Continuous 06/10/2020, MD   Held at 04/10/20 1804  . azithromycin (ZITHROMAX) 500 mg in sodium chloride 0.9 % 250 mL IVPB  500 mg Intravenous Q24H 1805, MD 250 mL/hr at 04/12/20 1019 500 mg at 04/12/20  1019  . cefTRIAXone (ROCEPHIN) 1 g in sodium chloride 0.9 % 100 mL IVPB  1 g Intravenous Q24H Delano Metz, MD 200 mL/hr at 04/12/20 0929 1 g at 04/12/20 0929  . dextrose 5 %-0.45 % sodium chloride infusion   Intravenous Continuous Delano Metz, MD   Held at 04/10/20 1650  .  insulin aspart (novoLOG) injection 0-5 Units  0-5 Units Subcutaneous QHS Pahwani, Ravi, MD      . insulin aspart (novoLOG) injection 0-9 Units  0-9 Units Subcutaneous TID WC Hughie Closs, MD   1 Units at 04/12/20 0848  . insulin glargine (LANTUS) injection 20 Units  20 Units Subcutaneous BID Hughie Closs, MD   20 Units at 04/12/20 [phone]  . living well with diabetes book MISC   Does not apply Once Hughie Closs, MD      . pantoprazole (PROTONIX) EC tablet 40 mg  40 mg Oral Daily Hughie Closs, MD   40 mg at 04/12/20 0915  . sodium chloride flush (NS) 0.9 % injection 10-40 mL  10-40 mL Intracatheter Q12H Delano Metz, MD   10 mL at 04/12/20 0915  . sodium chloride flush (NS) 0.9 % injection 10-40 mL  10-40 mL Intracatheter PRN Delano Metz, MD         Discharge Medications: Please see discharge summary for a list of discharge medications.  Relevant Imaging Results:  Relevant Lab Results:   Additional Information SSN [id number]  Lorri Frederick, LCSW

## 2020-04-12 NOTE — Progress Notes (Signed)
Physical Therapy Treatment Patient Details Name: Rachel Gonzalez MRN: [id number] DOB: [birth year] Today's Date: 04/12/2020    History of Present Illness Pt is a 70 year old lady with a history of type 2 diabetes mellitus on insulin, CKD stage IIIb, hypertension who initially presented to ED on 04/07/2020 due to fall and cold symptoms for more than 2 weeks.  She refused Covid test then.  X-ray showed nondisplaced fracture right eighth and ninth rib.  She was discharged home.  She was brought back in to the ED on 04/10/2020 by ambulance called by her husband due to altered mental status.  She was found to have DKA as well as hyperkalemia.    PT Comments    Patient progressing this session able to ambulate in hallway with RW without physical help and seems better able to follow commands as well as answer questions appropriately.  Still will need improvement prior to home, but feel she should progress and be able to d/c with initial 24 hour assist progressing to intermittent with follow up HHPT.    Follow Up Recommendations  Home health PT;Supervision/Assistance - 24 hour     Equipment Recommendations  Rolling walker with 5" wheels    Recommendations for Other Services       Precautions / Restrictions Precautions Precautions: Fall    Mobility  Bed Mobility               General bed mobility comments: up in chair  Transfers Overall transfer level: Needs assistance Equipment used: Rolling walker (2 wheeled) Transfers: Sit to/from Stand Sit to Stand: Min assist         General transfer comment: attempted unaided, needed three attempts and min A to stand  Ambulation/Gait Ambulation/Gait assistance: Supervision Gait Distance (Feet): 150 Feet Assistive device: Rolling walker (2 wheeled) Gait Pattern/deviations: Decreased stride length;Step-through pattern     General Gait Details: slow pace initially and legs a little wobbly, close S as pushing IV pole and hear monitor, but pt  did not lose balance even with turns in hallway; VSS throughout on RA   Stairs             Wheelchair Mobility    Modified Rankin (Stroke Patients Only)       Balance Overall balance assessment: Needs assistance   Sitting balance-Leahy Scale: Good       Standing balance-Leahy Scale: Fair                              Cognition Arousal/Alertness: Awake/alert Behavior During Therapy: WFL for tasks assessed/performed Overall Cognitive Status: Impaired/Different from baseline                     Current Attention Level: Selective Memory: Decreased short-term memory Following Commands: Follows one step commands consistently;Follows multi-step commands with increased time     Problem Solving: Slow processing        Exercises      General Comments General comments (skin integrity, edema, etc.): Discussed possible to go home with RW, but will need spouse home for some ADL like showering.      Pertinent Vitals/Pain Pain Assessment: No/denies pain    Home Living                      Prior Function            PT Goals (current goals can now be found in the  care plan section) Progress towards PT goals: Progressing toward goals    Frequency    Min 3X/week      PT Plan Discharge plan needs to be updated    Co-evaluation              AM-PAC PT "6 Clicks" Mobility   Outcome Measure  Help needed turning from your back to your side while in a flat bed without using bedrails?: A Little Help needed moving from lying on your back to sitting on the side of a flat bed without using bedrails?: A Little Help needed moving to and from a bed to a chair (including a wheelchair)?: None Help needed standing up from a chair using your arms (e.g., wheelchair or bedside chair)?: A Little Help needed to walk in hospital room?: None Help needed climbing 3-5 steps with a railing? : A Little 6 Click Score: 20    End of Session    Activity Tolerance: Patient tolerated treatment well Patient left: in chair;with call bell/phone within reach;with chair alarm set   PT Visit Diagnosis: Other abnormalities of gait and mobility (R26.89);History of falling (Z91.81)     Time: 3419-6222 PT Time Calculation (min) (ACUTE ONLY): 20 min  Charges:  $Gait Training: 8-22 mins                     Rachel Gonzalez, PT Acute Rehabilitation Services [phone] [phone] 04/12/2020    Rachel Gonzalez 04/12/2020, 1:39 PM

## 2020-04-12 NOTE — Plan of Care (Signed)

## 2020-04-12 NOTE — Progress Notes (Signed)
PROGRESS NOTE    DESA RECH  GQB:[id number] DOB: 1951/02/19 DOA: 04/10/2020 PCP: Massie Maroon, FNP   Brief Narrative:  [name] is a 70 year old lady with a history of type 2 diabetes mellitus on insulin, CKD stage IIIb, hypertension who initially presented to ED on 04/07/2020 due to fall and cold symptoms for more than 2 weeks.  She refused Covid test then.  X-ray showed nondisplaced fracture right eighth and ninth rib.  She was discharged home.  She was brought back in to the ED on 04/10/2020 by ambulance called by her husband due to altered mental status.  She was found to have DKA as well as hyperkalemia.  She was admitted to hospital service.  Started on insulin, IV fluids per protocol and DKA resolved.  She was transitioned to Lantus.  She was assessed by PT OT who is recommending SNF.  Assessment & Plan:   Principal Problem:   DKA (diabetic ketoacidosis) (HCC) Active Problems:   Essential hypertension   Hyperkalemia   Acute renal failure superimposed on stage 3 chronic kidney disease (HCC)   CAP (community acquired pneumonia)   Acute metabolic encephalopathy   Acute lower UTI  DKA in patient with type 2 diabetes mellitus: Her DKA has resolved, anion gap is closed.  Blood sugar fairly controlled.  Continue Lantus 20 units twice daily and SSI.  Checking hemoglobin A1c which is still pending.  Acute metabolic encephalopathy: Her mental status has improved.  She is now alert as she was yesterday and more oriented compared to yesterday however she is still having slow response and has intermittent.  Severe she thinks for extended period of time before answering the questions and sometimes is requiring some prompts.  Per husband, this is still not her baseline.  Hyperkalemia: Resolved.  AKI on CKD stage IIIb: Her baseline creatinine seems to be around 1.8.  She presented with 2.2.  Likely due to dehydration induced by polyuria and DKA.  Now back to baseline.  Essential  hypertension: Does not seem to be on any medications.  Systolic blood pressure slightly elevated, more than the goal in a diabetic patient.  Will start on low-dose of lisinopril 5 mg p.o. daily based on her renal function.  Will order as needed hydralazine.  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  Not on any antiplatelets.  Acute right lower rib fractures secondary to recent fall at home: Incentive spirometry.  Physical deconditioning/generalized weakness: Assessed by PT yesterday who recommended SNF.  Reassessed by PT today however now they recommend home with home health with 24-hour supervision initially or SNF.  Unfortunately, she does not have 24-hour supervision yet until Sunday.  Per PT, they will reassess her tomorrow, based on her progress, new recommendations will be placed.  Due to unsafe discharge plan, will need to stay in the hospital.  DVT prophylaxis:    Code Status: Full Code  Family Communication: None present at bedside.  Will call husband later today.  Status is: Inpatient  Remains inpatient appropriate because: Unsafe DC plan   Dispo: The patient is from: Home              Anticipated d/c is to: Home              Anticipated d/c date is: 1 day              Patient currently is medically stable to d/c.   Difficult to place patient No  Estimated body mass index is 38.62 kg/m as calculated from the following:   Height as of this encounter: 5\' 4"  (1.626 m).   Weight as of this encounter: 102.1 kg.      Nutritional status:               Consultants:   None  Procedures:   None  Antimicrobials:  Anti-infectives (From admission, onward)   Start     Dose/Rate Route Frequency Ordered Stop   04/11/20 1000  cefTRIAXone (ROCEPHIN) 1 g in sodium chloride 0.9 % 100 mL IVPB        1 g 200 mL/hr over 30 Minutes Intravenous Every 24 hours 04/10/20 1548 04/15/20 0959   04/11/20 1000  azithromycin (ZITHROMAX) 500 mg in sodium chloride 0.9 %  250 mL IVPB        500 mg 250 mL/hr over 60 Minutes Intravenous Every 24 hours 04/10/20 1548 04/15/20 0959   04/10/20 1330  cefTRIAXone (ROCEPHIN) 1 g in sodium chloride 0.9 % 100 mL IVPB        1 g 200 mL/hr over 30 Minutes Intravenous  Once 04/10/20 1318 04/10/20 1835   04/10/20 1330  azithromycin (ZITHROMAX) 500 mg in sodium chloride 0.9 % 250 mL IVPB        500 mg 250 mL/hr over 60 Minutes Intravenous  Once 04/10/20 1318 04/10/20 2100         Subjective: Seen and examined.  Alert as yesterday, more oriented today however slow in response, details as above.  Complains of some mild upper abdominal pain.  Objective: Vitals:   04/11/20 1706 04/11/20 1940 04/11/20 2352 04/12/20 0315  BP: (!) 158/77 (!) 150/62 139/69 (!) 158/80  Pulse: 86 84 82 73  Resp: 17 13 16 15   Temp: 98.7 F (37.1 C) 98.5 F (36.9 C) 98.6 F (37 C) 98.7 F (37.1 C)  TempSrc: Oral Oral Oral Oral  SpO2: 96% 96% 98% 97%  Weight:      Height:        Intake/Output Summary (Last 24 hours) at 04/12/2020 1107 Last data filed at 04/12/2020 0915 Gross per 24 hour  Intake 358.26 ml  Output 350 ml  Net 8.26 ml   Filed Weights   04/10/20 1300  Weight: 102.1 kg    Examination:  General exam: Appears calm and comfortable  Respiratory system: Clear to auscultation. Respiratory effort normal. Cardiovascular system: S1 & S2 heard, RRR. No JVD, murmurs, rubs, gallops or clicks. No pedal edema. Gastrointestinal system: Abdomen is nondistended, soft and nontender. No organomegaly or masses felt. Normal bowel sounds heard. Central nervous system: Alert and oriented. No focal neurological deficits. Extremities: Symmetric 5 x 5 power. Skin: No rashes, lesions or ulcers.  Psychiatry: Judgement and insight appear poor. Mood & affect flat   Data Reviewed: I have personally reviewed following labs and imaging studies  CBC: Recent Labs  Lab 04/10/20 1119 04/10/20 1140 04/10/20 1720 04/12/20 0251  WBC 7.8  --   8.0 6.3  NEUTROABS 6.4  --   --  2.8  HGB 12.5 14.6 11.5* 10.6*  HCT 41.4 43.0 35.4* 32.5*  MCV 86.8  --  83.1 83.1  PLT 198  --  188 165   Basic Metabolic Panel: Recent Labs  Lab 04/10/20 1720 04/11/20 0026 04/11/20 0345 04/11/20 0827 04/12/20 0251  NA 131* 139 139 139 142  K 5.4* 4.1 4.0 4.0 3.9  CL 93* 102 101 104 104  CO2 22 27 27 26  28  GLUCOSE 591* 147* 160* 127* 143*  BUN 29* 27* 25* 25* 23  CREATININE 1.88* 1.79* 1.88* 1.83* 1.77*  CALCIUM 8.8* 9.3 9.3 9.1 8.8*  PHOS 3.4  --   --   --   --    GFR: Estimated Creatinine Clearance: 35.4 mL/min (A) (by C-G formula based on SCr of 1.77 mg/dL (H)). Liver Function Tests: Recent Labs  Lab 04/10/20 1119 04/12/20 0251  AST 13* 14*  ALT 12 10  ALKPHOS 239* 142*  BILITOT 1.2 0.5  PROT 7.2 5.6*  ALBUMIN 3.5 2.6*   No results for input(s): LIPASE, AMYLASE in the last 168 hours. Recent Labs  Lab 04/10/20 1120  AMMONIA 13   Coagulation Profile: No results for input(s): INR, PROTIME in the last 168 hours. Cardiac Enzymes: No results for input(s): CKTOTAL, CKMB, CKMBINDEX, TROPONINI in the last 168 hours. BNP (last 3 results) No results for input(s): PROBNP in the last 8760 hours. HbA1C: No results for input(s): HGBA1C in the last 72 hours. CBG: Recent Labs  Lab 04/11/20 1233 04/11/20 1813 04/11/20 2121 04/12/20 0624 04/12/20 0710  GLUCAP 243* 188* 164* 135* 131*   Lipid Profile: No results for input(s): CHOL, HDL, LDLCALC, TRIG, CHOLHDL, LDLDIRECT in the last 72 hours. Thyroid Function Tests: Recent Labs    04/10/20 1119  TSH 0.838   Anemia Panel: No results for input(s): VITAMINB12, FOLATE, FERRITIN, TIBC, IRON, RETICCTPCT in the last 72 hours. Sepsis Labs: No results for input(s): PROCALCITON, LATICACIDVEN in the last 168 hours.  Recent Results (from the past 240 hour(s))  SARS Coronavirus 2 by RT PCR (hospital order, performed in [facility] hospital lab) Nasopharyngeal Nasopharyngeal Swab      Status: None   Collection Time: 04/10/20 11:20 AM   Specimen: Nasopharyngeal Swab  Result Value Ref Range Status   SARS Coronavirus 2 NEGATIVE NEGATIVE Final    Comment: (NOTE) SARS-CoV-2 target nucleic acids are NOT DETECTED.  The SARS-CoV-2 RNA is generally detectable in upper and lower respiratory specimens during the acute phase of infection. The lowest concentration of SARS-CoV-2 viral copies this assay can detect is 250 copies / mL. A negative result does not preclude SARS-CoV-2 infection and should not be used as the sole basis for treatment or other patient management decisions.  A negative result may occur with improper specimen collection / handling, submission of specimen other than nasopharyngeal swab, presence of viral mutation(s) within the areas targeted by this assay, and inadequate number of viral copies (<250 copies / mL). A negative result must be combined with clinical observations, patient history, and epidemiological information.  Fact Sheet for Patients:   BoilerBrush.com.cy  Fact Sheet for Healthcare Providers: https://pope.com/  This test is not yet approved or  cleared by the [place] FDA and has been authorized for detection and/or diagnosis of SARS-CoV-2 by FDA under an Emergency Use Authorization (EUA).  This EUA will remain in effect (meaning this test can be used) for the duration of the COVID-19 declaration under Section 564(b)(1) of the Act, 21 U.S.C. section 360bbb-3(b)(1), unless the authorization is terminated or revoked sooner.  Performed at [facility], [address] [place], [name] [address]   Urine culture     Status: None   Collection Time: 04/10/20 12:12 PM   Specimen: Urine, Catheterized  Result Value Ref Range Status   Specimen Description URINE, CATHETERIZED  Final   Special Requests NONE  Final   Culture   Final    NO GROWTH Performed at [facility]  [facility],  [address] [place], [name] [address]    Report Status 04/11/2020 FINAL  Final  Culture, blood (routine x 2)     Status: None (Preliminary result)   Collection Time: 04/10/20  1:19 PM   Specimen: BLOOD  Result Value Ref Range Status   Specimen Description BLOOD RIGHT ANTECUBITAL  Final   Special Requests   Final    BOTTLES DRAWN AEROBIC AND ANAEROBIC Blood Culture adequate volume   Culture   Final    NO GROWTH < 24 HOURS Performed at [facility], [address] [place], [name] [address]    Report Status PENDING  Incomplete  Culture, blood (routine x 2)     Status: None (Preliminary result)   Collection Time: 04/10/20  3:05 PM   Specimen: BLOOD RIGHT WRIST  Result Value Ref Range Status   Specimen Description BLOOD RIGHT WRIST  Final   Special Requests   Final    BOTTLES DRAWN AEROBIC AND ANAEROBIC Blood Culture results may not be optimal due to an inadequate volume of blood received in culture bottles   Culture   Final    NO GROWTH < 24 HOURS Performed at [facility], [address], [place], [name] [address]    Report Status PENDING  Incomplete      Radiology Studies: DG Chest 1 View  Result Date: 04/10/2020 CLINICAL DATA:  Altered mental status. EXAM: CHEST  1 VIEW COMPARISON:  Chest x-ray 04/07/2020. FINDINGS: Mediastinum and hilar structures normal. Low lung volumes with mild bibasilar atelectasis. Mild bibasilar infiltrates cannot be excluded. No pleural effusion or pneumothorax. Heart size normal. Right lower rib fractures best identified by prior rib series. IMPRESSION: Low lung volumes with mild bibasilar atelectasis. Mild bibasilar infiltrates cannot be excluded. Electronically Signed   By: Maisie Fus  Register   On: 04/10/2020 11:49   CT Head Wo Contrast  Result Date: 04/10/2020 CLINICAL DATA:  Head injury, altered mental status EXAM: CT HEAD WITHOUT CONTRAST TECHNIQUE: Contiguous axial images were obtained from the base of the skull through the vertex  without intravenous contrast. COMPARISON:  03/11/2014 FINDINGS: Brain: Normal anatomic configuration. Mild parenchymal volume loss is commensurate with the patient's age. No abnormal intra or extra-axial mass lesion or fluid collection. No abnormal mass effect or midline shift. No evidence of acute intracranial hemorrhage or infarct. Ventricular size is normal. Cerebellum unremarkable. Vascular: Unremarkable Skull: No acute fracture. Sinuses/Orbits: Paranasal sinuses are clear. Orbits are unremarkable. Other: Mastoid air cells and middle ear cavities are clear. IMPRESSION: No acute intracranial abnormality.  Mild senescent change. Electronically Signed   By: Helyn Numbers MD   On: 04/10/2020 12:02    Scheduled Meds: . insulin aspart  0-5 Units Subcutaneous QHS  . insulin aspart  0-9 Units Subcutaneous TID WC  . insulin glargine  20 Units Subcutaneous BID  . living well with diabetes book   Does not apply Once  . pantoprazole  40 mg Oral Daily  . sodium chloride flush  10-40 mL Intracatheter Q12H   Continuous Infusions: . sodium chloride Stopped (04/10/20 1804)  . azithromycin 500 mg (04/12/20 1019)  . cefTRIAXone (ROCEPHIN)  IV 1 g (04/12/20 0929)  . dextrose 5 % and 0.45% NaCl Stopped (04/10/20 1650)     LOS: 2 days   Time spent: 30 minutes   Hughie Closs, MD [facility]  04/12/2020, 11:07 AM   To contact the attending provider between 7A-7P or the covering provider during after hours  7P-7A, please log into the web site [email or web address].

## 2020-04-12 NOTE — TOC Initial Note (Addendum)
Transition of Care [facility]) - Initial/Assessment Note    Patient Details  Name: Rachel Gonzalez MRN: [id number] Date of Birth: 1950/09/23  Transition of Care [facility]) CM/SW Contact:    Joanne Chars, LCSW Phone Number: 04/12/2020, 11:08 AM  Clinical Narrative:  CSW met with pt and her son Rachel Gonzalez in the room.  Pt still appears somewhat confused.  Permission given to speak with husband, also Rachel Gonzalez, and with son.  Husband Rachel Gonzalez joined by phone.  Discussed recommendation for SNF or 24/7 supervision at home.  Pt indicated she wanted to go home.  Husband reports that he works, not sure about 24/7 supervision.  Husband asked for more time to discuss these options.  CSW asked for permission to start SNF process/send out referral and husband agrees to this.  Choice documents given for [facility] and SNF to son in room.   Pt is vaccinated for covid. Current equipment in home: walker.    1500: CSW spoke with husband Rachel Gonzalez  He is still at work and has not had a chance to talk about a plan.  He is hoping to be able to set up 24/7 support and bring pt home.  CSW provided him with the bed offers for SNF: [facility], [facility], and [facility].  Husband is asking if they can have until Sunday, CSW told him someone will probably call him tomorrow to check in as pt is stable for discharge.   44: CSW spoke to [employer] to find out if they manage SNF authorization.  Pt is not showing as eligible for coverage in their system--[employer] putting in eligibility check and then will call back once eligibility is established to let us know if they manage the policy.                 Expected Discharge Plan:  (family undecided between SNF and [facility]) Barriers to Discharge: Continued Medical Work up,SNF Pending bed offer   Patient Goals and CMS Choice   CMS Medicare.gov Compare Post Acute Care list provided to:: Patient Represenative (must comment) Choice offered to / list presented to : Adult Children  Expected Discharge Plan and  Services Expected Discharge Plan:  (family undecided between SNF and [facility])       Living arrangements for the past 2 months: Single Family Home                                      Prior Living Arrangements/Services Living arrangements for the past 2 months: Single Family Home Lives with:: Spouse Patient language and need for interpreter reviewed:: Yes        Need for Family Participation in Patient Care: Yes (Comment) Care giver support system in place?: Yes (comment) Current home services: Other (comment) (No services.  Family friend has been assisting in the evening.) Criminal Activity/Legal Involvement Pertinent to Current Situation/Hospitalization: No - Comment as needed  Activities of Daily Living Home Assistive Devices/Equipment: None ADL Screening (condition at time of admission) Patient's cognitive ability adequate to safely complete daily activities?: No Is the patient deaf or have difficulty hearing?: No Does the patient have difficulty seeing, even when wearing glasses/contacts?: No Does the patient have difficulty concentrating, remembering, or making decisions?: Yes Patient able to express need for assistance with ADLs?: Yes Does the patient have difficulty dressing or bathing?: Yes Independently performs ADLs?: No Communication: Independent Dressing (OT): Needs assistance Is this a change from baseline?:  Change from baseline, expected to last <3days Grooming: Needs assistance Is this a change from baseline?: Change from baseline, expected to last <3 days Feeding: Needs assistance Is this a change from baseline?: Change from baseline, expected to last <3 days Bathing: Needs assistance Is this a change from baseline?: Change from baseline, expected to last <3 days Toileting: Needs assistance Is this a change from baseline?: Change from baseline, expected to last <3 days In/Out Bed: Needs assistance Is this a change from baseline?: Change from baseline,  expected to last <3 days Walks in Home: Needs assistance Is this a change from baseline?: Change from baseline, expected to last <3 days Does the patient have difficulty walking or climbing stairs?: No Weakness of Legs: None Weakness of Arms/Hands: None  Permission Sought/Granted Permission sought to share information with : Family Supports Permission granted to share information with : Yes, Verbal Permission Granted  Share Information with NAME: husband Rachel Gonzalez, son Rachel Gonzalez  Permission granted to share info w AGENCY: SNF        Emotional Assessment Appearance:: Appears stated age Attitude/Demeanor/Rapport: Guarded Affect (typically observed): Withdrawn Orientation: : Oriented to Self,Oriented to Place,Oriented to Situation Alcohol / Substance Use: Not Applicable Psych Involvement: No (comment)  Admission diagnosis:  Hyperkalemia [E87.5] DKA (diabetic ketoacidosis) ([place]) [E11.10] AKI (acute kidney injury) ([place]) [N17.9] Diabetic ketoacidosis without coma associated with type 2 diabetes mellitus ([place]) [E11.10] AMS (altered mental status) [R41.82] Patient Active Problem List   Diagnosis Date Noted  . Acute metabolic encephalopathy 59/93/5701  . Acute lower UTI 04/11/2020  . DKA (diabetic ketoacidosis) ([place]) 04/10/2020  . Altered mental status 04/10/2020  . CAP (community acquired pneumonia) 04/10/2020  . Pyuria 04/10/2020  . Stage 3 chronic kidney disease ([place]) 12/01/2016  . Frequent falls 04/28/2016  . Right arm weakness 04/28/2016  . Bilateral lower extremity edema 04/28/2016  . Screening for colon cancer 04/28/2016  . Abnormal urinalysis 09/13/2014  . Chest pain 09/13/2014  . Abdominal pain, recurrent 09/13/2014  . Dehydration with hyponatremia 09/13/2014  . Hyperkalemia 09/13/2014  . Acute renal failure superimposed on stage 3 chronic kidney disease ([place]) 09/13/2014  . Anemia 05/06/2014  . Diabetic neuropathy, type II diabetes mellitus ([place]) 05/06/2014  . Obesity (BMI  30-39.9) 05/06/2014  . Cellulitis of right foot 05/06/2014  . Essential hypertension 03/07/2011  . 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 Right arm pain 08/04/2006  . Osteoarthritis 02/14/2006  . Uncontrolled type 2 diabetes mellitus with hyperglycemia ([place]) 01/11/2006   PCP:  Dorena Dew, FNP Pharmacy:   [facility], [address] [place] [address] Phone: [phone] Fax: [phone]     Social Determinants of Health (SDOH) Interventions    Readmission Risk Interventions No flowsheet data found.

## 2020-04-13 LAB — GLUCOSE, CAPILLARY
Glucose-Capillary: 154 mg/dL — ABNORMAL HIGH (ref 70–99)
Glucose-Capillary: 186 mg/dL — ABNORMAL HIGH (ref 70–99)
Glucose-Capillary: 209 mg/dL — ABNORMAL HIGH (ref 70–99)
Glucose-Capillary: 271 mg/dL — ABNORMAL HIGH (ref 70–99)

## 2020-04-13 MED ORDER — INSULIN GLARGINE 100 UNIT/ML ~~LOC~~ SOLN
24.0000 [IU] | Freq: Two times a day (BID) | SUBCUTANEOUS | Status: DC
  Administered 2020-04-13: 24 [IU] via SUBCUTANEOUS
  Filled 2020-04-13 (×3): qty 0.24

## 2020-04-13 NOTE — Progress Notes (Signed)
PROGRESS NOTE    DEMPLE CISSE  GPQ:[id number] DOB: Jan 25, 1951 DOA: 04/10/2020 PCP: Massie Maroon, FNP   Brief Narrative:  [name] is a 70 year old lady with a history of type 2 diabetes mellitus on insulin, CKD stage IIIb, hypertension who initially presented to ED on 04/07/2020 due to fall and cold symptoms for more than 2 weeks.  She refused Covid test then.  X-ray showed nondisplaced fracture right eighth and ninth rib.  She was discharged home.  She was brought back in to the ED on 04/10/2020 by ambulance called by her husband due to altered mental status.  She was found to have DKA as well as hyperkalemia.  She was admitted to hospital service.  Started on insulin, IV fluids per protocol and DKA resolved.  She was transitioned to Lantus.  She was assessed by PT OT who is recommending SNF.  Assessment & Plan:   Principal Problem:   DKA (diabetic ketoacidosis) (HCC) Active Problems:   Essential hypertension   Hyperkalemia   Acute renal failure superimposed on stage 3 chronic kidney disease (HCC)   CAP (community acquired pneumonia)   Acute metabolic encephalopathy   Acute lower UTI  DKA in patient with type 2 diabetes mellitus: Her DKA has resolved, anion gap is closed.  Blood sugar slightly elevated.  Increase Lantus to 24 units twice daily and continue SSI.  Acute metabolic encephalopathy: Her mental status has improved.  She is now alert as she was yesterday and more oriented compared to yesterday however she is still having slow response and has intermittent.   Hyperkalemia: Resolved.  AKI on CKD stage IIIb: Her baseline creatinine seems to be around 1.8.  She presented with 2.2.  Likely due to dehydration induced by polyuria and DKA.  Now back to baseline.  Essential hypertension: Does not seem to be on any medications.  Blood pressure much better than yesterday.  Continue lisinopril 5 mg p.o. daily based on her renal function and continue as needed  hydralazine.  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  Not on any antiplatelets.  Acute right lower rib fractures secondary to recent fall at home: Incentive spirometry.  Physical deconditioning/generalized weakness: PT recommends home with home health if 24-hour supervision available otherwise SNF.  Had a lengthy discussion with husband today.  They want patient to go to SNF as they do not think they can provide 24-hour supervision.  Notifed TOC.  DVT prophylaxis:    Code Status: Full Code  Family Communication: None present at bedside.  Discussed with husband  Status is: Inpatient  Remains inpatient appropriate because: Unsafe DC plan   Dispo: The patient is from: Home              Anticipated d/c is to: Home              Anticipated d/c date is: 1 day              Patient currently is medically stable to d/c.   Difficult to place patient No        Estimated body mass index is 38.62 kg/m as calculated from the following:   Height as of this encounter: 5\' 4"  (1.626 m).   Weight as of this encounter: 102.1 kg.      Nutritional status:               Consultants:   None  Procedures:   None  Antimicrobials:  Anti-infectives (From admission, onward)  Start     Dose/Rate Route Frequency Ordered Stop   04/11/20 1000  cefTRIAXone (ROCEPHIN) 1 g in sodium chloride 0.9 % 100 mL IVPB        1 g 200 mL/hr over 30 Minutes Intravenous Every 24 hours 04/10/20 1548 04/15/20 0959   04/11/20 1000  azithromycin (ZITHROMAX) 500 mg in sodium chloride 0.9 % 250 mL IVPB        500 mg 250 mL/hr over 60 Minutes Intravenous Every 24 hours 04/10/20 1548 04/15/20 0959   04/10/20 1330  cefTRIAXone (ROCEPHIN) 1 g in sodium chloride 0.9 % 100 mL IVPB        1 g 200 mL/hr over 30 Minutes Intravenous  Once 04/10/20 1318 04/10/20 1835   04/10/20 1330  azithromycin (ZITHROMAX) 500 mg in sodium chloride 0.9 % 250 mL IVPB        500 mg 250 mL/hr over 60 Minutes Intravenous   Once 04/10/20 1318 04/10/20 2100         Subjective: Seen and examined.  She has no complaints.  She is alert and oriented but slow in response.  Objective: Vitals:   04/12/20 1340 04/12/20 1920 04/12/20 2258 04/13/20 0803  BP: (!) 148/62 (!) 160/84 (!) 169/67 (!) 143/55  Pulse: 85 87 87 72  Resp: 20 18 18 18   Temp: 98.4 F (36.9 C) 98.4 F (36.9 C) 98.4 F (36.9 C)   TempSrc: Oral Oral Oral   SpO2: 100% 98% 98% 96%  Weight:      Height:        Intake/Output Summary (Last 24 hours) at 04/13/2020 06/11/2020 Last data filed at 04/12/2020 1925 Gross per 24 hour  Intake 240 ml  Output --  Net 240 ml   Filed Weights   04/10/20 1300  Weight: 102.1 kg    Examination:  General exam: Appears calm and comfortable  Respiratory system: Clear to auscultation. Respiratory effort normal. Cardiovascular system: S1 & S2 heard, RRR. No JVD, murmurs, rubs, gallops or clicks. No pedal edema. Gastrointestinal system: Abdomen is nondistended, soft and nontender. No organomegaly or masses felt. Normal bowel sounds heard. Central nervous system: Alert and oriented. No focal neurological deficits. Extremities: Symmetric 5 x 5 power. Skin: No rashes, lesions or ulcers.  Psychiatry: Judgement and insight appear poor   Data Reviewed: I have personally reviewed following labs and imaging studies  CBC: Recent Labs  Lab 04/10/20 1119 04/10/20 1140 04/10/20 1720 04/12/20 0251  WBC 7.8  --  8.0 6.3  NEUTROABS 6.4  --   --  2.8  HGB 12.5 14.6 11.5* 10.6*  HCT 41.4 43.0 35.4* 32.5*  MCV 86.8  --  83.1 83.1  PLT 198  --  188 165   Basic Metabolic Panel: Recent Labs  Lab 04/10/20 1720 04/11/20 0026 04/11/20 0345 04/11/20 0827 04/12/20 0251  NA 131* 139 139 139 142  K 5.4* 4.1 4.0 4.0 3.9  CL 93* 102 101 104 104  CO2 22 27 27 26 28   GLUCOSE 591* 147* 160* 127* 143*  BUN 29* 27* 25* 25* 23  CREATININE 1.88* 1.79* 1.88* 1.83* 1.77*  CALCIUM 8.8* 9.3 9.3 9.1 8.8*  PHOS 3.4  --   --   --    --    GFR: Estimated Creatinine Clearance: 35.4 mL/min (A) (by C-G formula based on SCr of 1.77 mg/dL (H)). Liver Function Tests: Recent Labs  Lab 04/10/20 1119 04/12/20 0251  AST 13* 14*  ALT 12 10  ALKPHOS 239* 142*  BILITOT 1.2 0.5  PROT 7.2 5.6*  ALBUMIN 3.5 2.6*   No results for input(s): LIPASE, AMYLASE in the last 168 hours. Recent Labs  Lab 04/10/20 1120  AMMONIA 13   Coagulation Profile: No results for input(s): INR, PROTIME in the last 168 hours. Cardiac Enzymes: No results for input(s): CKTOTAL, CKMB, CKMBINDEX, TROPONINI in the last 168 hours. BNP (last 3 results) No results for input(s): PROBNP in the last 8760 hours. HbA1C: Recent Labs    04/11/20 0209  HGBA1C >15.5*   CBG: Recent Labs  Lab 04/12/20 0710 04/12/20 1252 04/12/20 1639 04/12/20 2054 04/13/20 0801  GLUCAP 131* 176* 262* 312* 154*   Lipid Profile: No results for input(s): CHOL, HDL, LDLCALC, TRIG, CHOLHDL, LDLDIRECT in the last 72 hours. Thyroid Function Tests: Recent Labs    04/10/20 1119  TSH 0.838   Anemia Panel: No results for input(s): VITAMINB12, FOLATE, FERRITIN, TIBC, IRON, RETICCTPCT in the last 72 hours. Sepsis Labs: No results for input(s): PROCALCITON, LATICACIDVEN in the last 168 hours.  Recent Results (from the past 240 hour(s))  SARS Coronavirus 2 by RT PCR (hospital order, performed in [facility] hospital lab) Nasopharyngeal Nasopharyngeal Swab     Status: None   Collection Time: 04/10/20 11:20 AM   Specimen: Nasopharyngeal Swab  Result Value Ref Range Status   SARS Coronavirus 2 NEGATIVE NEGATIVE Final    Comment: (NOTE) SARS-CoV-2 target nucleic acids are NOT DETECTED.  The SARS-CoV-2 RNA is generally detectable in upper and lower respiratory specimens during the acute phase of infection. The lowest concentration of SARS-CoV-2 viral copies this assay can detect is 250 copies / mL. A negative result does not preclude SARS-CoV-2 infection and should not  be used as the sole basis for treatment or other patient management decisions.  A negative result may occur with improper specimen collection / handling, submission of specimen other than nasopharyngeal swab, presence of viral mutation(s) within the areas targeted by this assay, and inadequate number of viral copies (<250 copies / mL). A negative result must be combined with clinical observations, patient history, and epidemiological information.  Fact Sheet for Patients:   BoilerBrush.com.cy  Fact Sheet for Healthcare Providers: https://pope.com/  This test is not yet approved or  cleared by the [place] FDA and has been authorized for detection and/or diagnosis of SARS-CoV-2 by FDA under an Emergency Use Authorization (EUA).  This EUA will remain in effect (meaning this test can be used) for the duration of the COVID-19 declaration under Section 564(b)(1) of the Act, 21 U.S.C. section 360bbb-3(b)(1), unless the authorization is terminated or revoked sooner.  Performed at [facility], [address], [place], [name] [address]   Urine culture     Status: None   Collection Time: 04/10/20 12:12 PM   Specimen: Urine, Catheterized  Result Value Ref Range Status   Specimen Description URINE, CATHETERIZED  Final   Special Requests NONE  Final   Culture   Final    NO GROWTH Performed at [facility], [address] [place], [name] [address]    Report Status 04/11/2020 FINAL  Final  Culture, blood (routine x 2)     Status: None (Preliminary result)   Collection Time: 04/10/20  1:19 PM   Specimen: BLOOD  Result Value Ref Range Status   Specimen Description BLOOD RIGHT ANTECUBITAL  Final   Special Requests   Final    BOTTLES DRAWN AEROBIC AND ANAEROBIC Blood Culture adequate volume   Culture  Final    NO GROWTH 2 DAYS Performed at [facility], [address], [place], [name] [address]    Report Status  PENDING  Incomplete  Culture, blood (routine x 2)     Status: None (Preliminary result)   Collection Time: 04/10/20  3:05 PM   Specimen: BLOOD RIGHT WRIST  Result Value Ref Range Status   Specimen Description BLOOD RIGHT WRIST  Final   Special Requests   Final    BOTTLES DRAWN AEROBIC AND ANAEROBIC Blood Culture results may not be optimal due to an inadequate volume of blood received in culture bottles   Culture   Final    NO GROWTH 2 DAYS Performed at [facility], [address], [place], [name] [address]    Report Status PENDING  Incomplete      Radiology Studies: No results found.  Scheduled Meds: . insulin aspart  0-5 Units Subcutaneous QHS  . insulin aspart  0-9 Units Subcutaneous TID WC  . insulin glargine  24 Units Subcutaneous BID  . lisinopril  5 mg Oral Daily  . pantoprazole  40 mg Oral Daily  . sodium chloride flush  10-40 mL Intracatheter Q12H   Continuous Infusions: . sodium chloride Stopped (04/10/20 1804)  . azithromycin 500 mg (04/12/20 1019)  . cefTRIAXone (ROCEPHIN)  IV 1 g (04/13/20 0926)  . dextrose 5 % and 0.45% NaCl Stopped (04/10/20 1650)     LOS: 3 days   Time spent: 28 minutes   Hughie Closs, MD [facility]  04/13/2020, 9:39 AM   To contact the attending provider between 7A-7P or the covering provider during after hours 7P-7A, please log into the web site [email or web address].

## 2020-04-13 NOTE — TOC Progression Note (Signed)
Transition of Care [facility]) - Progression Note    Patient Details  Name: Rachel Gonzalez MRN: [id number] Date of Birth: Oct 06, 1950  [facility] CM/SW Contact  Carley Hammed, [place] Phone Number: 04/13/2020, 9:59 AM  Clinical Narrative:    CSW was contacted by pt's dr. Roberto Scales that pt's spouse would now like to pursue SNF. CSW spoke with spouse and he confirmed interest in [facility], He requested a private room, CSW told him she would ask. CSW will need facility to double check eligibility and start insurance auth. CSW was unable to leave VM, will continue to attempt to contact facility to make arrangements.  Expected Discharge Plan:  (family undecided between SNF and [facility]) Barriers to Discharge: Continued Medical Work up,SNF Pending bed offer  Expected Discharge Plan and Services Expected Discharge Plan:  (family undecided between SNF and [facility])       Living arrangements for the past 2 months: Single Family Home                                       Social Determinants of Health (SDOH) Interventions    Readmission Risk Interventions No flowsheet data found.

## 2020-04-14 LAB — GLUCOSE, CAPILLARY
Glucose-Capillary: 247 mg/dL — ABNORMAL HIGH (ref 70–99)
Glucose-Capillary: 223 mg/dL — ABNORMAL HIGH (ref 70–99)
Glucose-Capillary: 294 mg/dL — ABNORMAL HIGH (ref 70–99)
Glucose-Capillary: 268 mg/dL — ABNORMAL HIGH (ref 70–99)

## 2020-04-14 MED ORDER — ACETAMINOPHEN 325 MG PO TABS
650.0000 mg | ORAL_TABLET | Freq: Four times a day (QID) | ORAL | Status: DC | PRN
  Administered 2020-04-14 – 2020-04-16 (×4): 650 mg via ORAL
  Filled 2020-04-14 (×3): qty 2

## 2020-04-14 MED ORDER — INSULIN GLARGINE 100 UNIT/ML ~~LOC~~ SOLN
30.0000 [IU] | Freq: Two times a day (BID) | SUBCUTANEOUS | Status: DC
  Administered 2020-04-14 (×2): 30 [IU] via SUBCUTANEOUS
  Filled 2020-04-14 (×4): qty 0.3

## 2020-04-14 MED ORDER — INSULIN GLARGINE 100 UNIT/ML ~~LOC~~ SOLN: 28.0000 [IU] | Freq: Two times a day (BID) | SUBCUTANEOUS | Status: DC

## 2020-04-14 NOTE — Progress Notes (Signed)
PROGRESS NOTE    Rachel Gonzalez  VOJ:[id number] DOB: 05-Dec-1950 DOA: 04/10/2020 PCP: Massie Maroon, FNP   Brief Narrative:  [name] is a 70 year old lady with a history of type 2 diabetes mellitus on insulin, CKD stage IIIb, hypertension who initially presented to ED on 04/07/2020 due to fall and cold symptoms for more than 2 weeks.  She refused Covid test then.  X-ray showed nondisplaced fracture right eighth and ninth rib.  She was discharged home.  She was brought back in to the ED on 04/10/2020 by ambulance called by her husband due to altered mental status.  She was found to have DKA as well as hyperkalemia.  She was admitted to hospital service.  Started on insulin, IV fluids per protocol and DKA resolved.  She was transitioned to Lantus.  She was assessed by PT OT who is recommending SNF.  Assessment & Plan:   Principal Problem:   DKA (diabetic ketoacidosis) (HCC) Active Problems:   Essential hypertension   Hyperkalemia   Acute renal failure superimposed on stage 3 chronic kidney disease (HCC)   CAP (community acquired pneumonia)   Acute metabolic encephalopathy   Acute lower UTI  DKA in patient with type 2 diabetes mellitus: Her DKA has resolved, anion gap is closed.  Hemoglobin A1c on 04/11/2020> 15.5.  Blood sugar still elevated.  Will increase Lantus to 30 units twice daily and continue SSI.  Acute metabolic encephalopathy: Her mental status has improved.  She is now alert as she was yesterday and more oriented.  Hyperkalemia: Resolved.  AKI on CKD stage IIIb: Her baseline creatinine seems to be around 1.8.  She presented with 2.2.  Likely due to dehydration induced by polyuria and DKA.  Now back to baseline.  Essential hypertension: Does not seem to be on any medications.  Blood pressure controlled.  Continue lisinopril 5 mg p.o. daily based on her renal function and continue as needed hydralazine.  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  Not on any  antiplatelets.  Acute right lower rib fractures secondary to recent fall at home: Incentive spirometry.  Physical deconditioning/generalized weakness: PT recommends home with home health if 24-hour supervision available otherwise SNF.  Had a lengthy discussion with husband yesterday.  They want patient to go to SNF as they do not think they can provide 24-hour supervision.  Notifed TOC.  Work in progress.  DVT prophylaxis:    Code Status: Full Code  Family Communication: None present at bedside.  Discussed with husband yesterday.  Status is: Inpatient  Remains inpatient appropriate because: Unsafe DC plan   Dispo: The patient is from: Home              Anticipated d/c is to: Home              Anticipated d/c date is: 1 day              Patient currently is medically stable to d/c.   Difficult to place patient No        Estimated body mass index is 38.62 kg/m as calculated from the following:   Height as of this encounter: 5\' 4"  (1.626 m).   Weight as of this encounter: 102.1 kg.      Nutritional status:               Consultants:   None  Procedures:   None  Antimicrobials:  Anti-infectives (From admission, onward)   Start  Dose/Rate Route Frequency Ordered Stop   04/11/20 1000  cefTRIAXone (ROCEPHIN) 1 g in sodium chloride 0.9 % 100 mL IVPB        1 g 200 mL/hr over 30 Minutes Intravenous Every 24 hours 04/10/20 1548 04/14/20 1043   04/11/20 1000  azithromycin (ZITHROMAX) 500 mg in sodium chloride 0.9 % 250 mL IVPB        500 mg 250 mL/hr over 60 Minutes Intravenous Every 24 hours 04/10/20 1548 04/15/20 0959   04/10/20 1330  cefTRIAXone (ROCEPHIN) 1 g in sodium chloride 0.9 % 100 mL IVPB        1 g 200 mL/hr over 30 Minutes Intravenous  Once 04/10/20 1318 04/10/20 1835   04/10/20 1330  azithromycin (ZITHROMAX) 500 mg in sodium chloride 0.9 % 250 mL IVPB        500 mg 250 mL/hr over 60 Minutes Intravenous  Once 04/10/20 1318 04/10/20 2100          Subjective: Patient seen and examined.  She has no complaints.  She is fully alert and oriented.  Objective: Vitals:   04/13/20 1908 04/13/20 2301 04/14/20 0335 04/14/20 0734  BP: 136/76 (!) 109/53 121/65 133/81  Pulse: 75 71 66 65  Resp: 19 15 17 16   Temp: 98.5 F (36.9 C) 98.6 F (37 C) 98.4 F (36.9 C) 98.3 F (36.8 C)  TempSrc: Oral Oral Oral Oral  SpO2: 96% 99% 96% 98%  Weight:      Height:       No intake or output data in the 24 hours ending 04/14/20 1126 Filed Weights   04/10/20 1300  Weight: 102.1 kg    Examination: General exam: Appears calm and comfortable  Respiratory system: Clear to auscultation. Respiratory effort normal. Cardiovascular system: S1 & S2 heard, RRR. No JVD, murmurs, rubs, gallops or clicks. No pedal edema. Gastrointestinal system: Abdomen is nondistended, soft and nontender. No organomegaly or masses felt. Normal bowel sounds heard. Central nervous system: Alert and oriented. No focal neurological deficits. Extremities: Symmetric 5 x 5 power. Skin: No rashes, lesions or ulcers.  Psychiatry: Judgement and insight appear poor  Data Reviewed: I have personally reviewed following labs and imaging studies  CBC: Recent Labs  Lab 04/10/20 1119 04/10/20 1140 04/10/20 1720 04/12/20 0251  WBC 7.8  --  8.0 6.3  NEUTROABS 6.4  --   --  2.8  HGB 12.5 14.6 11.5* 10.6*  HCT 41.4 43.0 35.4* 32.5*  MCV 86.8  --  83.1 83.1  PLT 198  --  188 165   Basic Metabolic Panel: Recent Labs  Lab 04/10/20 1720 04/11/20 0026 04/11/20 0345 04/11/20 0827 04/12/20 0251  NA 131* 139 139 139 142  K 5.4* 4.1 4.0 4.0 3.9  CL 93* 102 101 104 104  CO2 22 27 27 26 28   GLUCOSE 591* 147* 160* 127* 143*  BUN 29* 27* 25* 25* 23  CREATININE 1.88* 1.79* 1.88* 1.83* 1.77*  CALCIUM 8.8* 9.3 9.3 9.1 8.8*  PHOS 3.4  --   --   --   --    GFR: Estimated Creatinine Clearance: 35.4 mL/min (A) (by C-G formula based on SCr of 1.77 mg/dL (H)). Liver Function  Tests: Recent Labs  Lab 04/10/20 1119 04/12/20 0251  AST 13* 14*  ALT 12 10  ALKPHOS 239* 142*  BILITOT 1.2 0.5  PROT 7.2 5.6*  ALBUMIN 3.5 2.6*   No results for input(s): LIPASE, AMYLASE in the last 168 hours. Recent Labs  Lab 04/10/20  1120  AMMONIA 13   Coagulation Profile: No results for input(s): INR, PROTIME in the last 168 hours. Cardiac Enzymes: No results for input(s): CKTOTAL, CKMB, CKMBINDEX, TROPONINI in the last 168 hours. BNP (last 3 results) No results for input(s): PROBNP in the last 8760 hours. HbA1C: No results for input(s): HGBA1C in the last 72 hours. CBG: Recent Labs  Lab 04/13/20 0801 04/13/20 1131 04/13/20 1633 04/13/20 2110 04/14/20 0733  GLUCAP 154* 186* 209* 271* 247*   Lipid Profile: No results for input(s): CHOL, HDL, LDLCALC, TRIG, CHOLHDL, LDLDIRECT in the last 72 hours. Thyroid Function Tests: No results for input(s): TSH, T4TOTAL, FREET4, T3FREE, THYROIDAB in the last 72 hours. Anemia Panel: No results for input(s): VITAMINB12, FOLATE, FERRITIN, TIBC, IRON, RETICCTPCT in the last 72 hours. Sepsis Labs: No results for input(s): PROCALCITON, LATICACIDVEN in the last 168 hours.  Recent Results (from the past 240 hour(s))  SARS Coronavirus 2 by RT PCR (hospital order, performed in [facility] hospital lab) Nasopharyngeal Nasopharyngeal Swab     Status: None   Collection Time: 04/10/20 11:20 AM   Specimen: Nasopharyngeal Swab  Result Value Ref Range Status   SARS Coronavirus 2 NEGATIVE NEGATIVE Final    Comment: (NOTE) SARS-CoV-2 target nucleic acids are NOT DETECTED.  The SARS-CoV-2 RNA is generally detectable in upper and lower respiratory specimens during the acute phase of infection. The lowest concentration of SARS-CoV-2 viral copies this assay can detect is 250 copies / mL. A negative result does not preclude SARS-CoV-2 infection and should not be used as the sole basis for treatment or other patient management decisions.  A  negative result may occur with improper specimen collection / handling, submission of specimen other than nasopharyngeal swab, presence of viral mutation(s) within the areas targeted by this assay, and inadequate number of viral copies (<250 copies / mL). A negative result must be combined with clinical observations, patient history, and epidemiological information.  Fact Sheet for Patients:   BoilerBrush.com.cy  Fact Sheet for Healthcare Providers: https://pope.com/  This test is not yet approved or  cleared by the [place] FDA and has been authorized for detection and/or diagnosis of SARS-CoV-2 by FDA under an Emergency Use Authorization (EUA).  This EUA will remain in effect (meaning this test can be used) for the duration of the COVID-19 declaration under Section 564(b)(1) of the Act, 21 U.S.C. section 360bbb-3(b)(1), unless the authorization is terminated or revoked sooner.  Performed at [facility], [address], [place], [name] [address]   Urine culture     Status: None   Collection Time: 04/10/20 12:12 PM   Specimen: Urine, Catheterized  Result Value Ref Range Status   Specimen Description URINE, CATHETERIZED  Final   Special Requests NONE  Final   Culture   Final    NO GROWTH Performed at [facility], [address] [place], [name] [address]    Report Status 04/11/2020 FINAL  Final  Culture, blood (routine x 2)     Status: None (Preliminary result)   Collection Time: 04/10/20  1:19 PM   Specimen: BLOOD  Result Value Ref Range Status   Specimen Description BLOOD RIGHT ANTECUBITAL  Final   Special Requests   Final    BOTTLES DRAWN AEROBIC AND ANAEROBIC Blood Culture adequate volume   Culture   Final    NO GROWTH 3 DAYS Performed at [facility], [address], [place], [name] [address]    Report Status PENDING  Incomplete  Culture, blood (routine x 2)     Status: None (Preliminary  result)   Collection Time: 04/10/20  3:05 PM   Specimen: BLOOD RIGHT WRIST  Result Value Ref Range Status   Specimen Description BLOOD RIGHT WRIST  Final   Special Requests   Final    BOTTLES DRAWN AEROBIC AND ANAEROBIC Blood Culture results may not be optimal due to an inadequate volume of blood received in culture bottles   Culture   Final    NO GROWTH 3 DAYS Performed at [facility], [address], [place], [name] [address]    Report Status PENDING  Incomplete      Radiology Studies: No results found.  Scheduled Meds: . insulin aspart  0-5 Units Subcutaneous QHS  . insulin aspart  0-9 Units Subcutaneous TID WC  . insulin glargine  30 Units Subcutaneous BID  . lisinopril  5 mg Oral Daily  . pantoprazole  40 mg Oral Daily  . sodium chloride flush  10-40 mL Intracatheter Q12H   Continuous Infusions: . sodium chloride Stopped (04/10/20 1804)  . azithromycin 500 mg (04/14/20 1057)  . dextrose 5 % and 0.45% NaCl Stopped (04/10/20 1650)     LOS: 4 days   Time spent: 26 minutes   Hughie Closs, MD [facility]  04/14/2020, 11:26 AM   To contact the attending provider between 7A-7P or the covering provider during after hours 7P-7A, please log into the web site [email or web address].

## 2020-04-15 LAB — CULTURE, BLOOD (ROUTINE X 2)
Culture: NO GROWTH
Culture: NO GROWTH
Special Requests: ADEQUATE

## 2020-04-15 LAB — CBC
HCT: 36.6 % (ref 36.0–46.0)
Hemoglobin: 11.3 g/dL — ABNORMAL LOW (ref 12.0–15.0)
MCH: 26.6 pg (ref 26.0–34.0)
MCHC: 30.9 g/dL (ref 30.0–36.0)
MCV: 86.1 fL (ref 80.0–100.0)
Platelets: 172 10*3/uL (ref 150–400)
RBC: 4.25 MIL/uL (ref 3.87–5.11)
RDW: 13.2 % (ref 11.5–15.5)
WBC: 6 10*3/uL (ref 4.0–10.5)
nRBC: 0 % (ref 0.0–0.2)

## 2020-04-15 LAB — GLUCOSE, CAPILLARY
Glucose-Capillary: 99 mg/dL (ref 70–99)
Glucose-Capillary: 177 mg/dL — ABNORMAL HIGH (ref 70–99)
Glucose-Capillary: 186 mg/dL — ABNORMAL HIGH (ref 70–99)
Glucose-Capillary: 192 mg/dL — ABNORMAL HIGH (ref 70–99)

## 2020-04-15 LAB — BASIC METABOLIC PANEL
Anion gap: 9 (ref 5–15)
BUN: 23 mg/dL (ref 8–23)
CO2: 23 mmol/L (ref 22–32)
Calcium: 9.1 mg/dL (ref 8.9–10.3)
Chloride: 107 mmol/L (ref 98–111)
Creatinine, Ser: 1.96 mg/dL — ABNORMAL HIGH (ref 0.44–1.00)
GFR, Estimated: 27 mL/min — ABNORMAL LOW (ref >60)
Glucose, Bld: 195 mg/dL — ABNORMAL HIGH (ref 70–99)
Potassium: 4.4 mmol/L (ref 3.5–5.1)
Sodium: 139 mmol/L (ref 135–145)

## 2020-04-15 LAB — MAGNESIUM: Magnesium: 2 mg/dL (ref 1.7–2.4)

## 2020-04-15 LAB — SARS CORONAVIRUS 2 (TAT 6-24 HRS): SARS Coronavirus 2: NEGATIVE

## 2020-04-15 MED ORDER — INSULIN GLARGINE 100 UNIT/ML ~~LOC~~ SOLN
35.0000 [IU] | Freq: Two times a day (BID) | SUBCUTANEOUS | Status: DC
  Administered 2020-04-15 – 2020-04-16 (×3): 35 [IU] via SUBCUTANEOUS
  Filled 2020-04-15 (×5): qty 0.35

## 2020-04-15 NOTE — Progress Notes (Signed)
Physical Therapy Treatment Patient Details Name: Rachel Gonzalez MRN: [id number] DOB: 09-23-1950 Today's Date: 04/15/2020    History of Present Illness Pt is a 70 year old lady with a history of type 2 diabetes mellitus on insulin, CKD stage IIIb, hypertension who initially presented to ED on 04/07/2020 due to fall and cold symptoms for more than 2 weeks.  She refused Covid test then.  X-ray showed nondisplaced fracture right eighth and ninth rib.  She was discharged home.  She was brought back in to the ED on 04/10/2020 by ambulance called by her husband due to altered mental status.  She was found to have DKA as well as hyperkalemia.    PT Comments    Pt fully participated in PT session. Following session discussed with OT possibility of d/c home. Pt describes home situation different to PT and OT. Pt informed OT would be home alone a lot more than she described to PT. Due to confusion and increased time alone continue to recommend SNF. Pt demonstrating improved balance and gait but continues to have deficits compared to baseline. Pt will benefit from skilled PT to address to maximize independence with functional mobility prior ot discharge.     Follow Up Recommendations  Home health PT;Supervision/Assistance - 24 hour;Other (comment) (family unable to provide 36 S or even S when OOB. due to inability to provide recommending SNF for safety)     Equipment Recommendations  Rolling walker with 5" wheels    Recommendations for Other Services       Precautions / Restrictions Precautions Precautions: Fall Restrictions Weight Bearing Restrictions: No    Mobility  Bed Mobility               General bed mobility comments: up in chair  Transfers Overall transfer level: Needs assistance Equipment used: Rolling walker (2 wheeled);None Transfers: Sit to/from Stand Sit to Stand: Supervision         General transfer comment: Pt requiring S to recal use of  RW  Ambulation/Gait Ambulation/Gait assistance: Supervision Gait Distance (Feet): 200 Feet Assistive device: Rolling walker (2 wheeled)       General Gait Details: pt with slightly decreased velocity, improved step through pattern. Pt requiring cueing for posture/aligngment in RW but was able to correct after cueing   Stairs             Wheelchair Mobility    Modified Rankin (Stroke Patients Only)       Balance Overall balance assessment: Needs assistance Sitting-balance support: No upper extremity supported;Feet supported Sitting balance-Leahy Scale: Good     Standing balance support: No upper extremity supported;During functional activity Standing balance-Leahy Scale: Fair Standing balance comment: fair static standing but unsteady - benefits from B UE support for mobility                            Cognition Arousal/Alertness: Awake/alert Behavior During Therapy: WFL for tasks assessed/performed Overall Cognitive Status: Impaired/Different from baseline Area of Impairment: Attention;Memory;Following commands;Safety/judgement;Problem solving;Awareness                   Current Attention Level: Selective Memory: Decreased short-term memory Following Commands: Follows one step commands consistently;Follows multi-step commands with increased time Safety/Judgement: Decreased awareness of safety;Decreased awareness of deficits Awareness: Emergent Problem Solving: Slow processing;Requires verbal cues General Comments: pt seems to be unaware of safety concerns.      Exercises      General Comments General comments (  skin integrity, edema, etc.): Discussed with OT possibility of d/c home. Pt describes home situation different to PT and OT. Pt informed OT would be home alone a lot more than she described to PT. Due to confusion and increased time alone continue to recommend SNF      Pertinent Vitals/Pain Pain Assessment: No/denies pain     Home Living                      Prior Function            PT Goals (current goals can now be found in the care plan section) Acute Rehab PT Goals Patient Stated Goal: To go home PT Goal Formulation: With patient/family Time For Goal Achievement: 04/25/20 Potential to Achieve Goals: Good Progress towards PT goals: Progressing toward goals    Frequency    Min 2X/week      PT Plan Discharge plan needs to be updated;Frequency needs to be updated    Co-evaluation              AM-PAC PT "6 Clicks" Mobility   Outcome Measure  Help needed turning from your back to your side while in a flat bed without using bedrails?: None Help needed moving from lying on your back to sitting on the side of a flat bed without using bedrails?: None Help needed moving to and from a bed to a chair (including a wheelchair)?: None Help needed standing up from a chair using your arms (e.g., wheelchair or bedside chair)?: A Little Help needed to walk in hospital room?: A Little Help needed climbing 3-5 steps with a railing? : A Little 6 Click Score: 21    End of Session Equipment Utilized During Treatment: Gait belt Activity Tolerance: Patient tolerated treatment well Patient left: in chair;with call bell/phone within reach;with chair alarm set Nurse Communication: Mobility status PT Visit Diagnosis: Other abnormalities of gait and mobility (R26.89);History of falling (Z91.81)     Time: 0315-9458 PT Time Calculation (min) (ACUTE ONLY): 17 min  Charges:  $Gait Training: 8-22 mins                     Ginette Otto, DPT Acute Rehabilitation Services [phone]   Lucretia Field 04/15/2020, 1:21 PM

## 2020-04-15 NOTE — Plan of Care (Signed)
  Problem: Education: Goal: Knowledge of General Education information will improve Description Including pain rating scale, medication(s)/side effects and non-pharmacologic comfort measures Outcome: Progressing   

## 2020-04-15 NOTE — Care Management Important Message (Signed)
Important Message  Patient Details  Name: Rachel Gonzalez MRN: [id number] Date of Birth: 04/24/1950   Medicare Important Message Given:  Yes     Dorena Bodo 04/15/2020, 2:59 PM
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
## 2020-04-15 NOTE — Progress Notes (Signed)
PROGRESS NOTE    Rachel Gonzalez  WUX:[id number] DOB: Jul 26, 1950 DOA: 04/10/2020 PCP: Massie Maroon, FNP   Brief Narrative:  [name] is a 70 year old lady with a history of type 2 diabetes mellitus on insulin, CKD stage IIIb, hypertension who initially presented to ED on 04/07/2020 due to fall and cold symptoms for more than 2 weeks.  She refused Covid test then.  X-ray showed nondisplaced fracture right eighth and ninth rib.  She was discharged home.  She was brought back in to the ED on 04/10/2020 by ambulance called by her husband due to altered mental status.  She was found to have DKA as well as hyperkalemia.  She was admitted to hospital service.  Started on insulin, IV fluids per protocol and DKA resolved.  She was transitioned to Lantus.  She was assessed by PT OT who is recommending SNF.  Assessment & Plan:   Principal Problem:   DKA (diabetic ketoacidosis) (HCC) Active Problems:   Essential hypertension   Hyperkalemia   Acute renal failure superimposed on stage 3 chronic kidney disease (HCC)   CAP (community acquired pneumonia)   Acute metabolic encephalopathy   Acute lower UTI  DKA in patient with type 2 diabetes mellitus: Her DKA has resolved, anion gap is closed.  Hemoglobin A1c on 04/11/2020> 15.5.  Blood sugar still elevated.  Will increase Lantus to 35 units twice daily and continue SSI.  Acute metabolic encephalopathy: Her mental status has improved.  She is now alert as she was yesterday and more oriented.  Hyperkalemia: Resolved.  AKI on CKD stage IIIb: Her baseline creatinine seems to be around 1.8.  She presented with 2.2.  Likely due to dehydration induced by polyuria and DKA.  Now back to baseline.  Essential hypertension: Does not seem to be on any medications.  Blood pressure controlled.  Continue lisinopril 5 mg p.o. daily based on her renal function and continue as needed hydralazine.  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  Not on any  antiplatelets.  Acute right lower rib fractures secondary to recent fall at home: Incentive spirometry.  Physical deconditioning/generalized weakness: PT recommends home with home health if 24-hour supervision available otherwise SNF.  Had a lengthy discussion with husband yesterday.  They want patient to go to SNF as they do not think they can provide 24-hour supervision. TOC aware and working on placement. Patient medically stable for discharge once placement arranged.  DVT prophylaxis:    Code Status: Full Code  Family Communication: None present at bedside.  Discussed with husband day before yesterday.  Status is: Inpatient  Remains inpatient appropriate because: Unsafe DC plan   Dispo: The patient is from: Home              Anticipated d/c is to: SNF              Anticipated d/c date is: Today or tomorrow depending on when bed is arranged.              Patient currently is medically stable to d/c.   Difficult to place patient No        Estimated body mass index is 38.62 kg/m as calculated from the following:   Height as of this encounter: 5\' 4"  (1.626 m).   Weight as of this encounter: 102.1 kg.      Nutritional status:               Consultants:   None  Procedures:  None  Antimicrobials:  Anti-infectives (From admission, onward)   Start     Dose/Rate Route Frequency Ordered Stop   04/11/20 1000  cefTRIAXone (ROCEPHIN) 1 g in sodium chloride 0.9 % 100 mL IVPB        1 g 200 mL/hr over 30 Minutes Intravenous Every 24 hours 04/10/20 1548 04/14/20 1043   04/11/20 1000  azithromycin (ZITHROMAX) 500 mg in sodium chloride 0.9 % 250 mL IVPB        500 mg 250 mL/hr over 60 Minutes Intravenous Every 24 hours 04/10/20 1548 04/14/20 1157   04/10/20 1330  cefTRIAXone (ROCEPHIN) 1 g in sodium chloride 0.9 % 100 mL IVPB        1 g 200 mL/hr over 30 Minutes Intravenous  Once 04/10/20 1318 04/10/20 1835   04/10/20 1330  azithromycin (ZITHROMAX) 500 mg in  sodium chloride 0.9 % 250 mL IVPB        500 mg 250 mL/hr over 60 Minutes Intravenous  Once 04/10/20 1318 04/10/20 2100         Subjective: Patient seen and examined. She is alert and oriented. She has no complaints.  Objective: Vitals:   04/14/20 1542 04/14/20 2310 04/15/20 0351 04/15/20 0800  BP: 137/89 (!) 127/57 (!) 151/83 125/74  Pulse: 72 71 70 65  Resp: 18 20 20 16   Temp: 98.6 F (37 C) 98.6 F (37 C) 97.7 F (36.5 C) 98 F (36.7 C)  TempSrc: Oral Oral Oral Oral  SpO2: 98% 97% 100%   Weight:      Height:       No intake or output data in the 24 hours ending 04/15/20 1121 Filed Weights   04/10/20 1300  Weight: 102.1 kg    Examination: General exam: Appears calm and comfortable  Respiratory system: Clear to auscultation. Respiratory effort normal. Cardiovascular system: S1 & S2 heard, RRR. No JVD, murmurs, rubs, gallops or clicks. No pedal edema. Gastrointestinal system: Abdomen is nondistended, soft and nontender. No organomegaly or masses felt. Normal bowel sounds heard. Central nervous system: Alert and oriented. No focal neurological deficits. Extremities: Symmetric 5 x 5 power. Skin: No rashes, lesions or ulcers.  Psychiatry: Judgement and insight appear poor  Data Reviewed: I have personally reviewed following labs and imaging studies  CBC: Recent Labs  Lab 04/10/20 1119 04/10/20 1140 04/10/20 1720 04/12/20 0251 04/15/20 0212  WBC 7.8  --  8.0 6.3 6.0  NEUTROABS 6.4  --   --  2.8  --   HGB 12.5 14.6 11.5* 10.6* 11.3*  HCT 41.4 43.0 35.4* 32.5* 36.6  MCV 86.8  --  83.1 83.1 86.1  PLT 198  --  188 165 172   Basic Metabolic Panel: Recent Labs  Lab 04/10/20 1720 04/11/20 0026 04/11/20 0345 04/11/20 0827 04/12/20 0251 04/15/20 0212  NA 131* 139 139 139 142 139  K 5.4* 4.1 4.0 4.0 3.9 4.4  CL 93* 102 101 104 104 107  CO2 22 27 27 26 28 23   GLUCOSE 591* 147* 160* 127* 143* 195*  BUN 29* 27* 25* 25* 23 23  CREATININE 1.88* 1.79* 1.88*  1.83* 1.77* 1.96*  CALCIUM 8.8* 9.3 9.3 9.1 8.8* 9.1  MG  --   --   --   --   --  2.0  PHOS 3.4  --   --   --   --   --    GFR: Estimated Creatinine Clearance: 32 mL/min (A) (by C-G formula based on SCr of 1.96 mg/dL (  H)). Liver Function Tests: Recent Labs  Lab 04/10/20 1119 04/12/20 0251  AST 13* 14*  ALT 12 10  ALKPHOS 239* 142*  BILITOT 1.2 0.5  PROT 7.2 5.6*  ALBUMIN 3.5 2.6*   No results for input(s): LIPASE, AMYLASE in the last 168 hours. Recent Labs  Lab 04/10/20 1120  AMMONIA 13   Coagulation Profile: No results for input(s): INR, PROTIME in the last 168 hours. Cardiac Enzymes: No results for input(s): CKTOTAL, CKMB, CKMBINDEX, TROPONINI in the last 168 hours. BNP (last 3 results) No results for input(s): PROBNP in the last 8760 hours. HbA1C: No results for input(s): HGBA1C in the last 72 hours. CBG: Recent Labs  Lab 04/14/20 0733 04/14/20 1146 04/14/20 1540 04/14/20 2035 04/15/20 0846  GLUCAP 247* 223* 294* 268* 99   Lipid Profile: No results for input(s): CHOL, HDL, LDLCALC, TRIG, CHOLHDL, LDLDIRECT in the last 72 hours. Thyroid Function Tests: No results for input(s): TSH, T4TOTAL, FREET4, T3FREE, THYROIDAB in the last 72 hours. Anemia Panel: No results for input(s): VITAMINB12, FOLATE, FERRITIN, TIBC, IRON, RETICCTPCT in the last 72 hours. Sepsis Labs: No results for input(s): PROCALCITON, LATICACIDVEN in the last 168 hours.  Recent Results (from the past 240 hour(s))  SARS Coronavirus 2 by RT PCR (hospital order, performed in [facility] hospital lab) Nasopharyngeal Nasopharyngeal Swab     Status: None   Collection Time: 04/10/20 11:20 AM   Specimen: Nasopharyngeal Swab  Result Value Ref Range Status   SARS Coronavirus 2 NEGATIVE NEGATIVE Final    Comment: (NOTE) SARS-CoV-2 target nucleic acids are NOT DETECTED.  The SARS-CoV-2 RNA is generally detectable in upper and lower respiratory specimens during the acute phase of infection. The  lowest concentration of SARS-CoV-2 viral copies this assay can detect is 250 copies / mL. A negative result does not preclude SARS-CoV-2 infection and should not be used as the sole basis for treatment or other patient management decisions.  A negative result may occur with improper specimen collection / handling, submission of specimen other than nasopharyngeal swab, presence of viral mutation(s) within the areas targeted by this assay, and inadequate number of viral copies (<250 copies / mL). A negative result must be combined with clinical observations, patient history, and epidemiological information.  Fact Sheet for Patients:   BoilerBrush.com.cy  Fact Sheet for Healthcare Providers: https://pope.com/  This test is not yet approved or  cleared by the [place] FDA and has been authorized for detection and/or diagnosis of SARS-CoV-2 by FDA under an Emergency Use Authorization (EUA).  This EUA will remain in effect (meaning this test can be used) for the duration of the COVID-19 declaration under Section 564(b)(1) of the Act, 21 U.S.C. section 360bbb-3(b)(1), unless the authorization is terminated or revoked sooner.  Performed at [facility], [address] [place], [name] [address]   Urine culture     Status: None   Collection Time: 04/10/20 12:12 PM   Specimen: Urine, Catheterized  Result Value Ref Range Status   Specimen Description URINE, CATHETERIZED  Final   Special Requests NONE  Final   Culture   Final    NO GROWTH Performed at [facility], [address] [place], [name] [address]    Report Status 04/11/2020 FINAL  Final  Culture, blood (routine x 2)     Status: None   Collection Time: 04/10/20  1:19 PM   Specimen: BLOOD  Result Value Ref Range Status   Specimen Description BLOOD RIGHT ANTECUBITAL  Final  Special Requests   Final    BOTTLES DRAWN AEROBIC AND ANAEROBIC Blood Culture adequate  volume   Culture   Final    NO GROWTH 5 DAYS Performed at [facility], [address] [place], [name] [address]    Report Status 04/15/2020 FINAL  Final  Culture, blood (routine x 2)     Status: None   Collection Time: 04/10/20  3:05 PM   Specimen: BLOOD RIGHT WRIST  Result Value Ref Range Status   Specimen Description BLOOD RIGHT WRIST  Final   Special Requests   Final    BOTTLES DRAWN AEROBIC AND ANAEROBIC Blood Culture results may not be optimal due to an inadequate volume of blood received in culture bottles   Culture   Final    NO GROWTH 5 DAYS Performed at [facility], [address], [place], [name] [address]    Report Status 04/15/2020 FINAL  Final      Radiology Studies: No results found.  Scheduled Meds: . insulin aspart  0-5 Units Subcutaneous QHS  . insulin aspart  0-9 Units Subcutaneous TID WC  . insulin glargine  35 Units Subcutaneous BID  . lisinopril  5 mg Oral Daily  . pantoprazole  40 mg Oral Daily  . sodium chloride flush  10-40 mL Intracatheter Q12H   Continuous Infusions: . sodium chloride Stopped (04/10/20 1804)  . dextrose 5 % and 0.45% NaCl Stopped (04/10/20 1650)     LOS: 5 days   Time spent: 25 minutes   Hughie Closs, MD [facility]  04/15/2020, 11:21 AM   To contact the attending provider between 7A-7P or the covering provider during after hours 7P-7A, please log into the web site [email or web address].
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
## 2020-04-16 DIAGNOSIS — N390 Urinary tract infection, site not specified: Secondary | ICD-10-CM | POA: Diagnosis not present

## 2020-04-16 DIAGNOSIS — M6259 Muscle wasting and atrophy, not elsewhere classified, multiple sites: Secondary | ICD-10-CM | POA: Diagnosis not present

## 2020-04-16 DIAGNOSIS — N1831 Chronic kidney disease, stage 3a: Secondary | ICD-10-CM | POA: Diagnosis not present

## 2020-04-16 DIAGNOSIS — S2239XA Fracture of one rib, unspecified side, initial encounter for closed fracture: Secondary | ICD-10-CM | POA: Diagnosis not present

## 2020-04-16 DIAGNOSIS — E1111 Type 2 diabetes mellitus with ketoacidosis with coma: Secondary | ICD-10-CM | POA: Diagnosis not present

## 2020-04-16 DIAGNOSIS — R5381 Other malaise: Secondary | ICD-10-CM | POA: Diagnosis not present

## 2020-04-16 DIAGNOSIS — E1122 Type 2 diabetes mellitus with diabetic chronic kidney disease: Secondary | ICD-10-CM | POA: Diagnosis not present

## 2020-04-16 DIAGNOSIS — R2681 Unsteadiness on feet: Secondary | ICD-10-CM | POA: Diagnosis not present

## 2020-04-16 DIAGNOSIS — M6281 Muscle weakness (generalized): Secondary | ICD-10-CM | POA: Diagnosis not present

## 2020-04-16 DIAGNOSIS — Z7401 Bed confinement status: Secondary | ICD-10-CM | POA: Diagnosis not present

## 2020-04-16 DIAGNOSIS — M2550 Pain in unspecified joint: Secondary | ICD-10-CM | POA: Diagnosis not present

## 2020-04-16 LAB — BASIC METABOLIC PANEL
Anion gap: 10 (ref 5–15)
BUN: 26 mg/dL — ABNORMAL HIGH (ref 8–23)
CO2: 24 mmol/L (ref 22–32)
Calcium: 9.4 mg/dL (ref 8.9–10.3)
Chloride: 106 mmol/L (ref 98–111)
Creatinine, Ser: 1.58 mg/dL — ABNORMAL HIGH (ref 0.44–1.00)
GFR, Estimated: 35 mL/min — ABNORMAL LOW (ref >60)
Glucose, Bld: 75 mg/dL (ref 70–99)
Potassium: 4.4 mmol/L (ref 3.5–5.1)
Sodium: 140 mmol/L (ref 135–145)

## 2020-04-16 LAB — GLUCOSE, CAPILLARY
Glucose-Capillary: 80 mg/dL (ref 70–99)
Glucose-Capillary: 111 mg/dL — ABNORMAL HIGH (ref 70–99)
Glucose-Capillary: 162 mg/dL — ABNORMAL HIGH (ref 70–99)

## 2020-04-16 MED ORDER — LANTUS SOLOSTAR 100 UNIT/ML ~~LOC~~ SOPN: 32.0000 [IU] | PEN_INJECTOR | Freq: Two times a day (BID) | SUBCUTANEOUS | 99 refills | Status: DC

## 2020-04-16 MED ORDER — INSULIN GLARGINE 100 UNIT/ML ~~LOC~~ SOLN
32.0000 [IU] | Freq: Two times a day (BID) | SUBCUTANEOUS | Status: DC
  Filled 2020-04-16: qty 0.32

## 2020-04-16 MED ORDER — LISINOPRIL 5 MG PO TABS: 5.0000 mg | ORAL_TABLET | Freq: Every day | ORAL | 0 refills | Status: DC

## 2020-04-16 NOTE — Progress Notes (Signed)
Attempted to call report x2 to [facility]. Left message with Secretary for receiving nurse to call me for report.

## 2020-04-16 NOTE — Progress Notes (Signed)
Inpatient Diabetes Program Recommendations  AACE/ADA: New Consensus Statement on Inpatient Glycemic Control (2015)  Target Ranges:  Prepandial:   less than 140 mg/dL      Peak postprandial:   less than 180 mg/dL (1-2 hours)      Critically ill patients:  140 - 180 mg/dL   Lab Results  Component Value Date   GLUCAP 80 04/16/2020   HGBA1C >15.5 (H) 04/11/2020    Review of Glycemic Control Results for ESPERANZA, MADRAZO (MRN [id number]) as of 04/16/2020 08:56  Ref. Range 04/15/2020 08:46 04/15/2020 11:58 04/15/2020 16:59 04/15/2020 22:57 04/16/2020 08:22  Glucose-Capillary Latest Ref Range: 70 - 99 mg/dL 99 076 (H) 808 (H) 811 (H) 80    Current orders for Inpatient glycemic control:  Lantus 35 units BID Novolog 0-9 units TID & 0-5 units QHS  Inpatient Diabetes Program Recommendations:    Lantus 30 units BID   Will continue to follow while inpatient.  Thank you, Dulce Sellar, RN, BSN Diabetes Coordinator Inpatient Diabetes Program [phone] (team pager from 8a-5p)

## 2020-04-16 NOTE — TOC Transition Note (Signed)
Transition of Care [facility]) - CM/SW Discharge Note   Patient Details  Name: Rachel Gonzalez MRN: [id number] Date of Birth: 05-Feb-1951  Transition of Care [facility]) CM/SW Contact:  Lorri Frederick, LCSW Phone Number: 04/16/2020, 1:34 PM   Clinical Narrative:   Pt discharging to [facility] Room 105.  RN call report to [phone].    Final next level of care: Skilled Nursing Facility Barriers to Discharge: Barriers Resolved   Patient Goals and CMS Choice   CMS Medicare.gov Compare Post Acute Care list provided to:: Patient Represenative (must comment) Choice offered to / list presented to : Adult Children  Discharge Placement              Patient chooses bed at: [facility] Patient to be transferred to facility by: [facility] Name of family member notified: husband Molly Maduro Patient and family notified of of transfer: 04/16/20  Discharge Plan and Services                                     Social Determinants of Health (SDOH) Interventions     Readmission Risk Interventions No flowsheet data found.

## 2020-04-16 NOTE — TOC Progression Note (Signed)
Transition of Care [facility]) - Progression Note    Patient Details  Name: Rachel Gonzalez MRN: [id number] Date of Birth: 1951/01/22  Transition of Care [facility]) CM/SW Contact  Lorri Frederick, LCSW Phone Number: 04/16/2020, 1:35 PM  Clinical Narrative:  CSW spoke with husband Molly Maduro regarding insurance issue and he had been unable to resolve by calling his car insurance company.  He provided contact  Numbers for CSW to try: [phone].  Policy [id number].  Molly Maduro gave CSW permission to speak with them and try to resolve the issue.  [employer] was unable to do anything at this 800 number, local agent is Oswald Hillock, [phone].  CSW spoke with Bonita Quin and Royal Hawthorn, explained the situation, and they will call the national office and try to help. Molly Maduro does have a medical rider on his car insurance to cover medical costs and he was paid $40 off this in 2017.   1100: text from Ruth, Ethlyn Gallery is approved.  All she needs is letter from [employer] stating they are not the health insurance provider.  CSW spoke with Royal Hawthorn again and he can write a letter and send to CSW. 1200: CSW received letter and forwarded to [facility], who confirms we are all set.     Expected Discharge Plan:  (family undecided between SNF and [facility]) Barriers to Discharge: Barriers Resolved  Expected Discharge Plan and Services Expected Discharge Plan:  (family undecided between SNF and [facility])       Living arrangements for the past 2 months: Single Family Home Expected Discharge Date: 04/16/20                                     Social Determinants of Health (SDOH) Interventions    Readmission Risk Interventions No flowsheet data found.

## 2020-04-16 NOTE — Discharge Summary (Signed)
Physician Discharge Summary  Rachel Gonzalez BMW:[id number] DOB: 1950-06-15 DOA: 04/10/2020  PCP: Dorena Dew, FNP  Admit date: 04/10/2020 Discharge date: 04/16/2020 30 Day Unplanned Readmission Risk Score   Flowsheet Row ED to Hosp-Admission (Current) from 04/10/2020 in [facility]  30 Day Unplanned Readmission Risk Score (%) 15.65 Filed at 04/16/2020 0801     This score is the patient's risk of an unplanned readmission within 30 days of being discharged (0 -100%). The score is based on dignosis, age, lab data, medications, orders, and past utilization.   Low:  0-14.9   Medium: 15-21.9   High: 22-29.9   Extreme: 30 and above         Admitted From: Home Disposition: SNF  Recommendations for Outpatient Follow-up:  1. Follow up with PCP in 1-2 weeks 2. Please obtain BMP/CBC in one week 3. Please follow up with your PCP on the following pending results: Unresulted Labs (From admission, onward)          Start     Ordered   04/16/20 2725  Basic metabolic panel  Once,   R       Question:  Specimen collection method  Answer:  Lab=Lab collect   04/16/20 0809            Home Health: None Equipment/Devices: None  Discharge Condition: Stable CODE STATUS: Full code Diet recommendation: Diabetic/heart healthy  Subjective: Seen and examined.  No complaints.  Looking forward to going to [facility] today.  Brief/Interim Summary: [name] is a 70 year old lady with a history of type 2 diabetes mellitus on insulin, CKD stage IIIb, hypertension who initially presented to ED on 04/07/2020 due to fall and cold symptoms for more than 2 weeks.  She refused Covid test then.  X-ray showed nondisplaced fracture right eighth and ninth rib.  She was discharged home.  She was brought back in to the ED on 04/10/2020 by ambulance called by her husband due to altered mental status.  She was found to have DKA which was causing acute encephalopathy as well as hyperkalemia.   She was admitted to hospital service.  Started on insulin, IV fluids per protocol and DKA resolved.    Hyperkalemia resolved.  She was transitioned to Lantus.  She was assessed by PT OT who is recommending SNF.  Her hemoglobin A1c was> 15.  Her Lantus was adjusted to 32 units twice daily.  She has been accepted to SNF and at this point in time, we are waiting for insurance authorization.  Hopefully we will receive that today and then she will be discharged in a stable condition.  Of note, she also had slight elevated blood pressure so she was started on low-dose of lisinopril 5 mg p.o. daily due to her chronic kidney disease.  Low-dose lisinopril will help with prevention of diabetic nephropathy as well.  Patient has been alert and oriented since last 2 days.  Discharge Diagnoses:  Principal Problem:   DKA (diabetic ketoacidosis) ([place]) Active Problems:   Essential hypertension   Hyperkalemia   Acute renal failure superimposed on stage 3 chronic kidney disease (HCC)   CAP (community acquired pneumonia)   Acute metabolic encephalopathy   Acute lower UTI    Discharge Instructions   Allergies as of 04/16/2020      Reactions   Tramadol Nausea And Vomiting   Adhesive [tape] Itching   Bactrim [sulfamethoxazole-trimethoprim] Other (See Comments)   AKI and hyperkalemia    Tylenol With  Codeine #3 [acetaminophen-codeine] Diarrhea, Itching   Only allergic to Codeine, patient reports no allergy to Tylenol.      Medication List    TAKE these medications   accu-chek softclix lancets Use as instructed   acetaminophen 500 MG tablet Commonly known as: TYLENOL Take 500-1,000 mg by mouth every 6 (six) hours as needed (pain.).   acetaminophen-codeine 300-30 MG tablet Commonly known as: TYLENOL #3 TAKE 1 TABLET BY MOUTH EVERY 6 HOURS AS NEEDED FOR MODERATE PAIN What changed:   how much to take  how to take this  when to take this  reasons to take this  additional instructions    cetirizine 10 MG tablet Commonly known as: ZyrTEC Allergy Take 1 tablet (10 mg total) by mouth daily.   diclofenac 75 MG EC tablet Commonly known as: VOLTAREN Take 1 tablet (75 mg total) by mouth 2 (two) times daily.   furosemide 20 MG tablet Commonly known as: Lasix Take 1 tablet (20 mg total) by mouth daily.   hydrOXYzine 25 MG tablet Commonly known as: ATARAX/VISTARIL Take 1-2 tablets (25-50 mg total) by mouth every 6 (six) hours as needed for itching.   Lantus SoloStar 100 UNIT/ML Solostar Pen Generic drug: insulin glargine Inject 32 Units into the skin 2 (two) times daily. What changed:   how much to take  when to take this   lisinopril 5 MG tablet Commonly known as: ZESTRIL Take 1 tablet (5 mg total) by mouth daily. Start taking on: April 17, 2020   omeprazole 20 MG capsule Commonly known as: PRILOSEC Take 1 capsule (20 mg total) by mouth daily.   ondansetron 8 MG tablet Commonly known as: Zofran Take 1 tablet (8 mg total) by mouth every 8 (eight) hours as needed for nausea or vomiting.   ONE TOUCH LANCETS Misc Test 3 times daily with glucometer. Dx Type 2 diabetes with unspecified complications.   ONE TOUCH ULTRA SYSTEM KIT w/Device Kit Test three times daily. Dx Type 2 Diabetes.   Accu-Chek Aviva Plus w/Device Kit 1 Device by Does not apply route once. Use as directed to check blood sugar   RELION PEN NEEDLE 31G/8MM 31G X 8 MM Misc Generic drug: Insulin Pen Needle USE AS DIRECTED 4 TIMES DAILY   triamcinolone 0.1 % cream : eucerin Crea Apply 1 application topically 3 (three) times daily as needed. What changed: reasons to take this   Trulicity 0.62 IR/4.8NI Sopn Generic drug: Dulaglutide Inject 0.75 mg into the skin once a week.       Contact information for follow-up providers    Dorena Dew, FNP Follow up in 1 week(s).   Specialty: Family Medicine Contact information: [place]. [place]  [address] [phone]            Contact information for after-discharge care    Destination    [facility] .   Service: Skilled Nursing Contact information: [address] [facility] [phone]                 Allergies  Allergen Reactions  . Tramadol Nausea And Vomiting  . Adhesive [Tape] Itching  . Bactrim [Sulfamethoxazole-Trimethoprim] Other (See Comments)    AKI and hyperkalemia   . Tylenol With Codeine #3 [Acetaminophen-Codeine] Diarrhea and Itching    Only allergic to Codeine, patient reports no allergy to Tylenol.    Consultations: None   Procedures/Studies: DG Chest 1 View  Result Date: 04/10/2020 CLINICAL DATA:  Altered mental status. EXAM: CHEST  1 VIEW COMPARISON:  Chest x-ray 04/07/2020. FINDINGS: Mediastinum and hilar structures normal. Low lung volumes with mild bibasilar atelectasis. Mild bibasilar infiltrates cannot be excluded. No pleural effusion or pneumothorax. Heart size normal. Right lower rib fractures best identified by prior rib series. IMPRESSION: Low lung volumes with mild bibasilar atelectasis. Mild bibasilar infiltrates cannot be excluded. Electronically Signed   By: Marcello Moores  Register   On: 04/10/2020 11:49   DG Ribs Unilateral W/Chest Right  Result Date: 04/07/2020 CLINICAL DATA:  Right-sided rib pain. EXAM: RIGHT RIBS AND CHEST - 3+ VIEW COMPARISON:  February 13, 2019 FINDINGS: Findings are suspicious for nondisplaced fractures involving the posterior ninth and eighth ribs on the right. There is no pneumothorax. The lungs are clear. The heart size is normal. There is no focal infiltrate or large pleural effusion. IMPRESSION: Findings suspicious for nondisplaced fractures involving the posterior right ninth and eighth ribs. No pneumothorax. Electronically Signed   By: Constance Holster M.D.   On: 04/07/2020 15:43   DG Tibia/Fibula Right  Result Date: 04/07/2020 CLINICAL DATA:  Pain EXAM:  RIGHT TIBIA AND FIBULA - 2 VIEW COMPARISON:  None. FINDINGS: There is no evidence of fracture or other focal bone lesions. Soft tissues are unremarkable. IMPRESSION: Negative. Electronically Signed   By: Constance Holster M.D.   On: 04/07/2020 15:44   CT Head Wo Contrast  Result Date: 04/10/2020 CLINICAL DATA:  Head injury, altered mental status EXAM: CT HEAD WITHOUT CONTRAST TECHNIQUE: Contiguous axial images were obtained from the base of the skull through the vertex without intravenous contrast. COMPARISON:  03/11/2014 FINDINGS: Brain: Normal anatomic configuration. Mild parenchymal volume loss is commensurate with the patient's age. No abnormal intra or extra-axial mass lesion or fluid collection. No abnormal mass effect or midline shift. No evidence of acute intracranial hemorrhage or infarct. Ventricular size is normal. Cerebellum unremarkable. Vascular: Unremarkable Skull: No acute fracture. Sinuses/Orbits: Paranasal sinuses are clear. Orbits are unremarkable. Other: Mastoid air cells and middle ear cavities are clear. IMPRESSION: No acute intracranial abnormality.  Mild senescent change. Electronically Signed   By: Fidela Salisbury MD   On: 04/10/2020 12:02      Discharge Exam: Vitals:   04/16/20 0420 04/16/20 0800  BP: (!) 123/110 (!) 152/68  Pulse: 71 74  Resp: 18 15  Temp: 97.9 F (36.6 C) 98 F (36.7 C)  SpO2: 95% 94%   Vitals:   04/15/20 2001 04/15/20 2352 04/16/20 0420 04/16/20 0800  BP: (!) 148/62 (!) 159/88 (!) 123/110 (!) 152/68  Pulse: 65 79 71 74  Resp: _0 Temp: 98 F (36.7 C) 98.1 F (36.7 C) 97.9 F (36.6 C) 98 F (36.7 C)  TempSrc: Oral Oral Oral Oral  SpO2: 98% 98% 95% 94%  Weight:      Height:        General: Pt is alert, awake, not in acute distress Cardiovascular: RRR, S1/S2 +, no rubs, no gallops Respiratory: CTA bilaterally, no wheezing, no rhonchi Abdominal: Soft, NT, ND, bowel sounds + Extremities: no edema, no cyanosis    The results  of significant diagnostics from this hospitalization (including imaging, microbiology, ancillary and laboratory) are listed below for reference.     Microbiology: Recent Results (from the past 240 hour(s))  SARS Coronavirus 2 by RT PCR (hospital order, performed in [facility] hospital lab) Nasopharyngeal Nasopharyngeal Swab     Status: None   Collection Time: 04/10/20 11:20 AM   Specimen: Nasopharyngeal Swab  Result Value Ref Range Status   SARS Coronavirus 2 NEGATIVE NEGATIVE Final    Comment: (NOTE) SARS-CoV-2 target nucleic acids are NOT DETECTED.  The SARS-CoV-2 RNA is generally detectable in upper and lower respiratory specimens during the acute phase of infection. The lowest concentration of SARS-CoV-2 viral copies this assay can detect is 250 copies / mL. A negative result does not preclude SARS-CoV-2 infection and should not be used as the sole basis for treatment or other patient management decisions.  A negative result may occur with improper specimen collection / handling, submission of specimen other than nasopharyngeal swab, presence of viral mutation(s) within the areas targeted by this assay, and inadequate number of viral copies (<250 copies / mL). A negative result must be combined with clinical observations, patient history, and epidemiological information.  Fact Sheet for Patients:   StrictlyIdeas.no  Fact Sheet for Healthcare Providers: BankingDealers.co.za  This test is not yet approved or  cleared by the [place] FDA and has been authorized for detection and/or diagnosis of SARS-CoV-2 by FDA under an Emergency Use Authorization (EUA).  This EUA will remain in effect (meaning this test can be used) for the duration of the COVID-19 declaration under Section 564(b)(1) of the Act, 21 U.S.C. section 360bbb-3(b)(1), unless the authorization is terminated or revoked sooner.  Performed at [facility],  [place] [address], [place], [place] [address]   Urine culture     Status: None   Collection Time: 04/10/20 12:12 PM   Specimen: Urine, Catheterized  Result Value Ref Range Status   Specimen Description URINE, CATHETERIZED  Final   Special Requests NONE  Final   Culture   Final    NO GROWTH Performed at [facility], [address], [place], [place] [address]    Report Status 04/11/2020 FINAL  Final  Culture, blood (routine x 2)     Status: None   Collection Time: 04/10/20  1:19 PM   Specimen: BLOOD  Result Value Ref Range Status   Specimen Description BLOOD RIGHT ANTECUBITAL  Final   Special Requests   Final    BOTTLES DRAWN AEROBIC AND ANAEROBIC Blood Culture adequate volume   Culture   Final    NO GROWTH 5 DAYS Performed at [facility], [place] [address] [place], [place] [address]    Report Status 04/15/2020 FINAL  Final  Culture, blood (routine x 2)     Status: None   Collection Time: 04/10/20  3:05 PM   Specimen: BLOOD RIGHT WRIST  Result Value Ref Range Status   Specimen Description BLOOD RIGHT WRIST  Final   Special Requests   Final    BOTTLES DRAWN AEROBIC AND ANAEROBIC Blood Culture results may not be optimal due to an inadequate volume of blood received in culture bottles   Culture   Final    NO GROWTH 5 DAYS Performed at [facility], [place] [address] [place], [place] [address]    Report Status 04/15/2020 FINAL  Final  SARS CORONAVIRUS 2 (TAT 6-24 HRS) Nasopharyngeal Nasopharyngeal Swab     Status: None   Collection Time: 04/15/20  1:51 PM   Specimen: Nasopharyngeal Swab  Result Value Ref Range Status   SARS Coronavirus 2 NEGATIVE NEGATIVE Final    Comment: (NOTE) SARS-CoV-2 target nucleic acids are NOT DETECTED.  The SARS-CoV-2 RNA is generally detectable in upper and lower respiratory specimens during the acute phase of infection. Negative results do not preclude SARS-CoV-2 infection, do not  rule out co-infections with other pathogens, and should  not be used as the sole basis for treatment or other patient management decisions. Negative results must be combined with clinical observations, patient history, and epidemiological information. The expected result is Negative.  Fact Sheet for Patients: SugarRoll.be  Fact Sheet for Healthcare Providers: https://www.woods-mathews.com/  This test is not yet approved or cleared by the [place] FDA and  has been authorized for detection and/or diagnosis of SARS-CoV-2 by FDA under an Emergency Use Authorization (EUA). This EUA will remain  in effect (meaning this test can be used) for the duration of the COVID-19 declaration under Se ction 564(b)(1) of the Act, 21 U.S.C. section 360bbb-3(b)(1), unless the authorization is terminated or revoked sooner.  Performed at [facility], [place] [address], [place], [place] [address]      Labs: BNP (last 3 results) No results for input(s): BNP in the last 8760 hours. Basic Metabolic Panel: Recent Labs  Lab 04/10/20 1720 04/11/20 0026 04/11/20 0345 04/11/20 0827 04/12/20 0251 04/15/20 0212  NA 131* 139 139 139 142 139  K 5.4* 4.1 4.0 4.0 3.9 4.4  CL 93* 102 101 104 104 107  CO2 _0 GLUCOSE 591* 147* 160* 127* 143* 195*  BUN 29* 27* 25* 25* 23 23  CREATININE 1.88* 1.79* 1.88* 1.83* 1.77* 1.96*  CALCIUM 8.8* 9.3 9.3 9.1 8.8* 9.1  MG  --   --   --   --   --  2.0  PHOS 3.4  --   --   --   --   --    Liver Function Tests: Recent Labs  Lab 04/10/20 1119 04/12/20 0251  AST 13* 14*  ALT 12 10  ALKPHOS 239* 142*  BILITOT 1.2 0.5  PROT 7.2 5.6*  ALBUMIN 3.5 2.6*   No results for input(s): LIPASE, AMYLASE in the last 168 hours. Recent Labs  Lab 04/10/20 1120  AMMONIA 13   CBC: Recent Labs  Lab 04/10/20 1119 04/10/20 1140 04/10/20 1720 04/12/20 0251 04/15/20 0212  WBC 7.8  --  8.0 6.3 6.0  NEUTROABS 6.4  --   --  2.8  --   HGB 12.5 14.6 11.5* 10.6* 11.3*   HCT 41.4 43.0 35.4* 32.5* 36.6  MCV 86.8  --  83.1 83.1 86.1  PLT 198  --  188 165 172   Cardiac Enzymes: No results for input(s): CKTOTAL, CKMB, CKMBINDEX, TROPONINI in the last 168 hours. BNP: Invalid input(s): POCBNP CBG: Recent Labs  Lab 04/15/20 0846 04/15/20 1158 04/15/20 1659 04/15/20 2257 04/16/20 0822  GLUCAP 99 177* 186* 192* 80   D-Dimer No results for input(s): DDIMER in the last 72 hours. Hgb A1c No results for input(s): HGBA1C in the last 72 hours. Lipid Profile No results for input(s): CHOL, HDL, LDLCALC, TRIG, CHOLHDL, LDLDIRECT in the last 72 hours. Thyroid function studies No results for input(s): TSH, T4TOTAL, T3FREE, THYROIDAB in the last 72 hours.  Invalid input(s): FREET3 Anemia work up No results for input(s): VITAMINB12, FOLATE, FERRITIN, TIBC, IRON, RETICCTPCT in the last 72 hours. Urinalysis    Component Value Date/Time   COLORURINE STRAW (A) 04/10/2020 1212   APPEARANCEUR HAZY (A) 04/10/2020 1212   LABSPEC 1.017 04/10/2020 1212   PHURINE 5.0 04/10/2020 1212   GLUCOSEU >=500 (A) 04/10/2020 1212   HGBUR SMALL (A) 04/10/2020 1212   BILIRUBINUR NEGATIVE 04/10/2020 1212   BILIRUBINUR negative 02/27/2020 1223   KETONESUR 20 (A) 04/10/2020 1212  PROTEINUR NEGATIVE 04/10/2020 1212   UROBILINOGEN 0.2 02/27/2020 1223   UROBILINOGEN 0.2 04/01/2017 0837   NITRITE NEGATIVE 04/10/2020 1212   LEUKOCYTESUR MODERATE (A) 04/10/2020 1212   Sepsis Labs Invalid input(s): PROCALCITONIN,  WBC,  LACTICIDVEN Microbiology Recent Results (from the past 240 hour(s))  SARS Coronavirus 2 by RT PCR (hospital order, performed in [facility] hospital lab) Nasopharyngeal Nasopharyngeal Swab     Status: None   Collection Time: 04/10/20 11:20 AM   Specimen: Nasopharyngeal Swab  Result Value Ref Range Status   SARS Coronavirus 2 NEGATIVE NEGATIVE Final    Comment: (NOTE) SARS-CoV-2 target nucleic acids are NOT DETECTED.  The SARS-CoV-2 RNA is generally detectable  in upper and lower respiratory specimens during the acute phase of infection. The lowest concentration of SARS-CoV-2 viral copies this assay can detect is 250 copies / mL. A negative result does not preclude SARS-CoV-2 infection and should not be used as the sole basis for treatment or other patient management decisions.  A negative result may occur with improper specimen collection / handling, submission of specimen other than nasopharyngeal swab, presence of viral mutation(s) within the areas targeted by this assay, and inadequate number of viral copies (<250 copies / mL). A negative result must be combined with clinical observations, patient history, and epidemiological information.  Fact Sheet for Patients:   StrictlyIdeas.no  Fact Sheet for Healthcare Providers: BankingDealers.co.za  This test is not yet approved or  cleared by the [place] FDA and has been authorized for detection and/or diagnosis of SARS-CoV-2 by FDA under an Emergency Use Authorization (EUA).  This EUA will remain in effect (meaning this test can be used) for the duration of the COVID-19 declaration under Section 564(b)(1) of the Act, 21 U.S.C. section 360bbb-3(b)(1), unless the authorization is terminated or revoked sooner.  Performed at [facility], [place] [address] [place], [place] [address]   Urine culture     Status: None   Collection Time: 04/10/20 12:12 PM   Specimen: Urine, Catheterized  Result Value Ref Range Status   Specimen Description URINE, CATHETERIZED  Final   Special Requests NONE  Final   Culture   Final    NO GROWTH Performed at [facility], [address] [place], [place] [address]    Report Status 04/11/2020 FINAL  Final  Culture, blood (routine x 2)     Status: None   Collection Time: 04/10/20  1:19 PM   Specimen: BLOOD  Result Value Ref Range Status   Specimen Description BLOOD RIGHT ANTECUBITAL  Final    Special Requests   Final    BOTTLES DRAWN AEROBIC AND ANAEROBIC Blood Culture adequate volume   Culture   Final    NO GROWTH 5 DAYS Performed at [facility], [place] [address] [place], [place] [address]    Report Status 04/15/2020 FINAL  Final  Culture, blood (routine x 2)     Status: None   Collection Time: 04/10/20  3:05 PM   Specimen: BLOOD RIGHT WRIST  Result Value Ref Range Status   Specimen Description BLOOD RIGHT WRIST  Final   Special Requests   Final    BOTTLES DRAWN AEROBIC AND ANAEROBIC Blood Culture results may not be optimal due to an inadequate volume of blood received in culture bottles   Culture   Final    NO GROWTH 5 DAYS Performed at [facility], [place] [address], [place],  [address]    Report Status 04/15/2020  FINAL  Final  SARS CORONAVIRUS 2 (TAT 6-24 HRS) Nasopharyngeal Nasopharyngeal Swab     Status: None   Collection Time: 04/15/20  1:51 PM   Specimen: Nasopharyngeal Swab  Result Value Ref Range Status   SARS Coronavirus 2 NEGATIVE NEGATIVE Final    Comment: (NOTE) SARS-CoV-2 target nucleic acids are NOT DETECTED.  The SARS-CoV-2 RNA is generally detectable in upper and lower respiratory specimens during the acute phase of infection. Negative results do not preclude SARS-CoV-2 infection, do not rule out co-infections with other pathogens, and should not be used as the sole basis for treatment or other patient management decisions. Negative results must be combined with clinical observations, patient history, and epidemiological information. The expected result is Negative.  Fact Sheet for Patients: SugarRoll.be  Fact Sheet for Healthcare Providers: https://www.woods-mathews.com/  This test is not yet approved or cleared by the [place] FDA and  has been authorized for detection and/or diagnosis of SARS-CoV-2 by FDA under an Emergency Use Authorization (EUA). This EUA will remain  in  effect (meaning this test can be used) for the duration of the COVID-19 declaration under Se ction 564(b)(1) of the Act, 21 U.S.C. section 360bbb-3(b)(1), unless the authorization is terminated or revoked sooner.  Performed at [facility], [place] [address] [place], [place] [address]      Time coordinating discharge: Over 30 minutes  SIGNED:   Darliss Cheney, MD  [facility] 04/16/2020, 10:00 AM  If 7PM-7AM, please contact night-coverage [email or web address]

## 2020-04-18 ENCOUNTER — Other Ambulatory Visit: Payer: Self-pay

## 2020-04-18 DIAGNOSIS — E1122 Type 2 diabetes mellitus with diabetic chronic kidney disease: Secondary | ICD-10-CM | POA: Diagnosis not present

## 2020-04-18 DIAGNOSIS — S2239XA Fracture of one rib, unspecified side, initial encounter for closed fracture: Secondary | ICD-10-CM | POA: Diagnosis not present

## 2020-04-18 DIAGNOSIS — N1831 Chronic kidney disease, stage 3a: Secondary | ICD-10-CM | POA: Diagnosis not present

## 2020-04-18 DIAGNOSIS — R5381 Other malaise: Secondary | ICD-10-CM | POA: Diagnosis not present

## 2020-04-18 NOTE — Patient Outreach (Signed)
[facility]) Care Management  04/18/2020  DAMEISHA TSCHIDA 01-22-1951 [id number]   Referral Date: 04/18/20 Referral Source: [employer] Report Date of Discharge: 04/16/20 Facility:  [facility] Insurance: [facility]   Referral received.  No outreach warranted at this time.  Patient discharged to SNF.     Plan: RN CM will close case.    [name], RN, MSN [facility] Care Management Care Management Coordinator Direct Line [phone] Toll Free: [phone]  Fax: [phone]

## 2020-04-24 ENCOUNTER — Other Ambulatory Visit: Payer: Self-pay | Admitting: *Deleted

## 2020-04-24 ENCOUNTER — Encounter: Payer: Self-pay | Admitting: *Deleted

## 2020-04-24 NOTE — Patient Outreach (Signed)
[facility]) Care Management  04/24/2020  Rachel Gonzalez 08-12-1950 [id number]   Transition of Care Referral   Referral date: 04/24/20 Referral source: [employer] Report Date of Discharge: 04/22/20 Facility: [facility] Insurance: [facility]   Per electronic record patient with recent Hospital admission at [facility] 2/2-2/22. Dx: DKA with acute encephalopathy , hyperkalemia. Prior to that she experienced ED visit on 1/30 due to fall , xray showed nondisplaced fracture right eighth and ninth rib.   Initial outreach  Subjective: Successful outreach call to patient explained reason for the call . She discussed recent discharge from [facility]. She reports feeling okay on today.   Social  Patient lives at home with her spouse that works during the day. She reports tolerating mobility in home using a cane. She declines having need for home health therapy after discharge.  She reports tolerating being able to make small meals for self , frozen meals, sandwiches. Discussed [employer] benefit of well dine meals patient is interested.  Patient states that prior to recent admission she would drive her self to appointments, she plans to have her husband provide transportation to appointments for now. She states her children live out of town.   Conditions Patient discussed conditions of Diabetes for past 15 years. She reports not being able to check  blood sugars due to her monitor being broke . She reports recent admission with high blood sugar,she reports before admission  having symptoms of weakness , sweatiness drinking large cups of orange juice to correct because she thought her blood sugar was low. Noted blood sugar over 800. Reviewed with patient signs symptoms of hypoglycemia and treatment , reinforced importance of having monitor to check readings.  Patient discussed recent fall after tripping on sidewalk, with  rib fractures noted at  urgent care visit prior to hospital  admission.  pain improved .  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      Medications  Patient reports taking medications as before hospital . She discussed managing her daily medications taking from bottles daily. She is unable to review medications at this time. She discussed taking insulin Lantus as prior to admission at 45 units at HS. Reviewed with patient recent instructions at discharge, advised importance of provider follow for medications review for appropriate insulin dose. Patient report taking 45 units once since discharge , and awakening feeling sweaty, weak, drinking orange juice and felt better, she states she only took 22 units on last night and no problems with feelings of low blood sugar symptoms she also reports having snack at bedtime. Reinforced symptoms of low blood sugar including amounts of carbohydrate intake such as juice 1/2 cup  Patient discussed being prescribed Trulicity, reports getting it filled x 1, but too costly to continue to refill over $600.  Will benefit form pharmacy referral due to cost concern related to Trulicity and patient agreeable.   Appointments.  Patient will need post discharge appointment with Julianne Handler, FNP, patient agreeable to care coordinator assisting her.   Consent Explained and offered [facility] care management services to asisst with managing chronic conditions , diabetes , coordinating post discharge care needs. Patient is agreeable to [facility] care management follow up after discharge.   Patient unable to complete assessment/care plan at this time agreeable to return call later today. 1630 Returned call to patient no answer, able to leave a HIPAA compliant message  for return call.    Plan Unable to complete initial assessment/care plan  at this visit,patient was agreeable to return call, will attempt again next business day.  Will notify MD  office of patient concern regarding post discharge dose of insulin she should take.  Placed call to [employer] for [facility] well Dine post discharge meals, patient meals to be delivered by [employer] on 2/23.  Will send patient welcome letter .   Egbert Garibaldi, RN, BSN  [facility] Care Management,Care Management Coordinator  [phone]- Mobile [phone]- Toll Free Main Office

## 2020-04-25 ENCOUNTER — Other Ambulatory Visit: Payer: Self-pay

## 2020-04-25 ENCOUNTER — Other Ambulatory Visit: Payer: Self-pay | Admitting: Family Medicine

## 2020-04-25 ENCOUNTER — Other Ambulatory Visit: Payer: Self-pay | Admitting: *Deleted

## 2020-04-25 ENCOUNTER — Telehealth: Payer: Self-pay | Admitting: Family Medicine

## 2020-04-25 DIAGNOSIS — E1165 Type 2 diabetes mellitus with hyperglycemia: Secondary | ICD-10-CM

## 2020-04-25 MED ORDER — FREESTYLE LITE TEST VI STRP: ORAL_STRIP | 12 refills | Status: DC

## 2020-04-25 MED ORDER — ATORVASTATIN CALCIUM 20 MG PO TABS: 20.0000 mg | ORAL_TABLET | Freq: Every day | ORAL | 3 refills | Status: DC

## 2020-04-25 MED ORDER — ACCU-CHEK GUIDE W/DEVICE KIT: 1.0000 | PACK | Freq: Four times a day (QID) | 0 refills | Status: DC

## 2020-04-25 MED ORDER — FREESTYLE LANCETS MISC: 12 refills | Status: DC

## 2020-04-25 MED ORDER — ACCU-CHEK GUIDE VI STRP: ORAL_STRIP | 12 refills | Status: DC

## 2020-04-25 MED ORDER — ACCU-CHEK SOFTCLIX LANCETS MISC: 3 refills | Status: DC

## 2020-04-25 MED ORDER — FREESTYLE LITE W/DEVICE KIT: 1.0000 | PACK | Freq: Three times a day (TID) | 0 refills | Status: DC

## 2020-04-25 NOTE — Patient Outreach (Signed)
[facility]) Care Management  04/25/2020  Rachel Gonzalez [birth year] [id number]  Referral for medication assistance from Egbert Garibaldi, RN sent to [facility].  [name] Management Assistant [phone]

## 2020-04-25 NOTE — Progress Notes (Signed)
Medications reviewed and the following changes were made. Patient has a post hospital follow up scheduled on May 08, 2018.   Meds ordered this encounter  Medications  . atorvastatin (LIPITOR) 20 MG tablet    Sig: Take 1 tablet (20 mg total) by mouth daily.    Dispense:  90 tablet    Refill:  3    Order Specific Question:   Supervising Provider    Answer:   Tresa Garter [id number]  . Blood Glucose Monitoring Suppl (FREESTYLE LITE) w/Device KIT    Sig: 1 each by Does not apply route 4 (four) times daily -  before meals and at bedtime.    Dispense:  1 kit    Refill:  0    Order Specific Question:   Supervising Provider    Answer:   Tresa Garter [id number]  . glucose blood (FREESTYLE LITE) test strip    Sig: Check blood glucose 3 times per day and at bedtime    Dispense:  200 each    Refill:  12    Order Specific Question:   Supervising Provider    Answer:   Tresa Garter [[id number]]  . Lancets (FREESTYLE) lancets    Sig: Use as instructed    Dispense:  100 each    Refill:  12    Order Specific Question:   Supervising Provider    Answer:   Tresa Garter [[id number]]     Donia Pounds  APRN, MSN, FNP-C Patient [place] [address] [place], [place] [address] [phone]

## 2020-04-25 NOTE — Patient Instructions (Signed)
Hypoglycemia Hypoglycemia is when the sugar (glucose) level in your blood is too low. Low blood sugar can happen to people who have diabetes and people who do not have diabetes. Low blood sugar can happen quickly, and it can be an emergency. What are the causes? This condition happens most often in people who have diabetes and may be caused by:  Diabetes medicine.  Not eating enough, or not eating often enough.  Doing more physical activity.  Drinking alcohol on an empty stomach. If you do not have diabetes, hypoglycemia may be caused by:  A tumor in the pancreas.  Not eating enough, or not eating for long periods at a time (fasting).  A very bad infection or illness.  Problems after having weight loss (bariatric) surgery.  Kidney failure or liver failure.  Certain medicines. What increases the risk? This condition is more likely to develop in people who:  Have diabetes and take medicines to lower their blood sugar.  Abuse alcohol.  Have a very bad illness. What are the signs or symptoms? Symptoms depend on whether your low blood sugar is mild, moderate, or very low. Mild  Hunger.  Feeling worried or nervous (anxious).  Sweating and feeling clammy.  Feeling dizzy or light-headed.  Being sleepy or having trouble sleeping.  Feeling like you may vomit (nauseous).  A fast heartbeat.  A headache.  Blurry vision.  Being irritable or grouchy.  Tingling or loss of feeling (numbness) around your mouth, lips, or tongue.  Trouble with moving (coordination). Moderate  Confusion and poor judgment.  Behavior changes.  Weakness.  Uneven heartbeats. Very low Very low blood sugar (severe hypoglycemia) is a medical emergency. It can cause:  Fainting.  Jerky movements that you cannot control (seizure).  Loss of consciousness (coma).  Death. How is this treated? Treating low blood sugar Low blood sugar is often treated by eating or drinking something  sugary right away. The snack should contain 15 grams of a fast-acting carb (carbohydrate). Options include:  4 oz (120 mL) of fruit juice.  4-6 oz (120-150 mL) of regular soda (not diet soda).  8 oz (240 mL) of low-fat milk.  Several pieces of hard candy. Check food labels to find out how many to eat for 15 grams.  1 Tbsp (15 mL) of sugar or honey. Treating low blood sugar if you have diabetes If you can think clearly and swallow safely, follow the 15:15 rule:  Take 15 grams of a fast-acting carb. Talk with your doctor about how much you should take.  Always keep a source of fast-acting carb with you, such as: ? Sugar tablets (glucose pills). Take 4 pills. ? Several pieces of hard candy. Check food labels to see how many pieces to eat for 15 grams. ? 4 oz (120 mL) of fruit juice. ? 4-6 oz (120-150 mL) of regular (not diet) soda. ? 1 Tbsp (15 mL) of honey or sugar.  Check your blood sugar 15 minutes after you take the carb.  If your blood sugar is still at or below 70 mg/dL (3.9 mmol/L), take 15 grams of a carb again.  If your blood sugar does not go above 70 mg/dL (3.9 mmol/L) after 3 tries, get help right away.  After your blood sugar goes back to normal, eat a meal or a snack within 1 hour.   Treating very low blood sugar If your blood sugar is at or below 54 mg/dL (3 mmol/L), you have very low blood sugar, or severe   hypoglycemia. This is an emergency. Get medical help right away. If you have very low blood sugar and you cannot eat or drink, you will need to be given a hormone called glucagon. A family member or friend should learn how to check your blood sugar and how to give you glucagon. Ask your doctor if you need to have an emergency glucagon kit at home. Very low blood sugar may also need to be treated in a hospital. Follow these instructions at home: General instructions  Take over-the-counter and prescription medicines only as told by your doctor.  Stay aware of your  blood sugar as told by your doctor.  If you drink alcohol: ? Limit how much you use to:  0-1 drink a day for nonpregnant women.  0-2 drinks a day for men. ? Be aware of how much alcohol is in your drink. In the [place] one drink equals one 12 oz bottle of beer (355 mL), one 5 oz glass of wine (148 mL), or one 1 oz glass of hard liquor (44 mL).  Keep all follow-up visits as told by your doctor. This is important. If you have diabetes:  Always have a rapid-acting carb (15 grams) option with you to treat low blood sugar.  Follow your diabetes care plan as told by your doctor. Make sure you: ? Know the symptoms of low blood sugar. ? Check your blood sugar as often as told by your doctor. Always check it before and after exercise. ? Always check your blood sugar before you drive. ? Take your medicines as told. ? Follow your meal plan. ? Eat on time. Do not skip meals.  Share your diabetes care plan with: ? Your work or school. ? People you live with.  Carry a card or wear jewelry that says you have diabetes.   Contact a doctor if:  You have trouble keeping your blood sugar in your target range.  You have low blood sugar often. Get help right away if:  You still have symptoms after you eat or drink something that contains 15 grams of fast-acting carb and you cannot get your blood sugar above 70 mg/dL by following the 15:15 rule.  Your blood sugar is at or below 54 mg/dL (3 mmol/L).  You have a seizure.  You faint. These symptoms may be an emergency. Do not wait to see if the symptoms will go away. Get medical help right away. Call your local emergency services (911 in the [place] Do not drive yourself to the hospital. Summary  Hypoglycemia happens when the level of sugar (glucose) in your blood is too low.  Low blood sugar can happen to people who have diabetes and people who do not have diabetes. Low blood sugar can happen quickly, and it can be an emergency.  Make sure you  know the symptoms of low blood sugar and know how to treat it.  Always keep a source of sugar (fast-acting carb) with you to treat low blood sugar. This information is not intended to replace advice given to you by your health care provider. Make sure you discuss any questions you have with your health care provider. Document Revised: 01/18/2019 Document Reviewed: 01/18/2019 Elsevier Patient Education  2021 [employer].

## 2020-04-25 NOTE — Telephone Encounter (Signed)
Admission was due to high blood sugars.  Meter is broke.  Need meter & strips, lancetcents to [facility] [place] [facility] [address]. Pt has questions regarading insulin use. 45 UNITS reduced to 22 UNITS at night and has not had any problems.  Per [facility] said 32 UNITS twice a day but pt had lsymptoms low blood suar on that. So pt changed dosage herself to 22 UNITS at night with good results.  Amlodipine 10 mg pt says still taking, but it is not on her list.   Atorvastatin pt says she is taking but it is not on her list.  Ander Purpura [employer] speaking with pt on phone.  Transferred to CMA for further assistance.

## 2020-04-25 NOTE — Patient Outreach (Signed)
[place] [facility]) Care Management  04/25/2020  Rachel Gonzalez 03/21/1950 [id number]   [place] [facility]) Care Management  [facility] Care Manager  04/25/2020   Rachel Gonzalez 07-02-1950 [id number]   Transition of Care Referral  Telephone follow up call   Referral date: 04/24/20 Referral source: [employer] Report Date of Discharge: 04/22/20 Facility: [place]   Per electronic record patient with recent Hospital admission at [facility] 2/2-2/22. Dx: DKA with acute encephalopathy , hyperkalemia. Prior to that she experienced ED visit on 1/30 due to fall , xray showed nondisplaced fracture right eighth and ninth rib.    Subjective:  Successful follow up call to patient , she reports feeling okay on today. She denies having symptoms of low blood sugar on last night reports taking 22 units of Lantus on last night.  She reports tolerating meals able to prepare her breakfast on this morning, [place] bacon, eggs, sugar free juice.  She discussed receiving phone call from home health agency on yesterday,regarding plans for home health therapy she is unable to recall the name of agency.   In patient review of medications noted patient taking medications not currently on profile - Amilodipine, Atrovastatin . Also discussed need for clarification of patient insulin dose as she is currently taking 22 units at night after having low blood sugar episode once at home with taking pre hospital admission dose of Lantus of 45 units, not taking insulin as recent discharge of 32 units twice daily, as she reports episode of low blood sugar symptoms while in rehab. Placed call to Cammie Sickle provider office to clarify medications and request order for CBG meter, strips and lancet supplies.  Spoke with nurse Louretta Shorten that provided information to Cammie Sickle FNP  that will review medications and plan return call to Care Coordinator.   Return call from [place],  [place], discussed above concerns related to patient medications, including insulin, she reviewed medication list and updating in epic. Patient to continue Lantus 32 units twice daily emphasize with patient notify office sooner for recurrent episodes  regarding low blood sugar readings symptoms.  Placed return call to patient to explain, will send copy of after visit summary.   Objective:   Encounter Medications:  Outpatient Encounter Medications as of 04/25/2020  Medication Sig Note  . furosemide (LASIX) 20 MG tablet Take 1 tablet (20 mg total) by mouth daily.   . insulin glargine (LANTUS SOLOSTAR) 100 UNIT/ML Solostar Pen Inject 32 Units into the skin 2 (two) times daily. 04/25/2020: Reports taking 22 units at hs   . omeprazole (PRILOSEC) 20 MG capsule Take 1 capsule (20 mg total) by mouth daily.   Marland Kitchen acetaminophen (TYLENOL) 500 MG tablet Take 500-1,000 mg by mouth every 6 (six) hours as needed (pain.).   Marland Kitchen acetaminophen-codeine (TYLENOL #3) 300-30 MG tablet TAKE 1 TABLET BY MOUTH EVERY 6 HOURS AS NEEDED FOR MODERATE PAIN (Patient not taking: Reported on 04/25/2020)   . cetirizine (ZYRTEC ALLERGY) 10 MG tablet Take 1 tablet (10 mg total) by mouth daily. (Patient not taking: Reported on 04/25/2020)   . Dulaglutide (TRULICITY) 1.10 RP/5.9YV SOPN Inject 0.75 mg into the skin once a week. (Patient not taking: Reported on 04/25/2020) 04/10/2020: Unknown specific day   . lisinopril (ZESTRIL) 5 MG tablet Take 1 tablet (5 mg total) by mouth daily.   . ondansetron (ZOFRAN) 8 MG tablet Take 1 tablet (8 mg total) by mouth every 8 (eight) hours as needed for nausea or vomiting. (  Patient not taking: Reported on 04/25/2020)   . RELION PEN NEEDLE 31G/8MM 31G X 8 MM MISC USE AS DIRECTED 4 TIMES DAILY   . Triamcinolone Acetonide (TRIAMCINOLONE 0.1 % CREAM : EUCERIN) CREA Apply 1 application topically 3 (three) times daily as needed. (Patient not taking: Reported on 04/25/2020)   . [DISCONTINUED] Blood Glucose Monitoring Suppl  (ACCU-CHEK AVIVA PLUS) w/Device KIT 1 Device by Does not apply route once. Use as directed to check blood sugar   . [DISCONTINUED] Blood Glucose Monitoring Suppl (ONE TOUCH ULTRA SYSTEM KIT) w/Device KIT Test three times daily. Dx Type 2 Diabetes.   . [DISCONTINUED] diclofenac (VOLTAREN) 75 MG EC tablet Take 1 tablet (75 mg total) by mouth 2 (two) times daily. (Patient not taking: Reported on 04/25/2020)   . [DISCONTINUED] hydrOXYzine (ATARAX/VISTARIL) 25 MG tablet Take 1-2 tablets (25-50 mg total) by mouth every 6 (six) hours as needed for itching. (Patient not taking: Reported on 04/25/2020)   . [DISCONTINUED] Lancet Devices (ACCU-CHEK SOFTCLIX) lancets Use as instructed   . [DISCONTINUED] ONE TOUCH LANCETS MISC Test 3 times daily with glucometer. Dx Type 2 diabetes with unspecified complications.    No facility-administered encounter medications on file as of 04/25/2020.    Functional Status:  In your present state of health, do you have any difficulty performing the following activities: 04/24/2020 04/11/2020  Hearing? N N  Vision? N N  Comment has glaucoma -  Difficulty concentrating or making decisions? N Y  Walking or climbing stairs? Y N  Comment uses cane -  Dressing or bathing? N Y  Doing errands, shopping? Y N  Comment husband assist with transportation -  Conservation officer, nature and eating ? Y -  Comment husband helps -  Using the Toilet? N -  In the past six months, have you accidently leaked urine? N -  Do you have problems with loss of bowel control? N -  Managing your Medications? N -  Managing your Finances? N -  Housekeeping or managing your Housekeeping? Y -  Comment husband helps -  Some recent data might be hidden    Fall/Depression Screening: Fall Risk  04/24/2020 08/29/2019 05/23/2019  Falls in the past year? 1 0 1  Number falls in past yr: 1 - 1  Injury with Fall? 1 - -  Risk for fall due to : History of fall(s) - -  Follow up Education provided;Falls prevention discussed  - -   PHQ 2/9 Scores 08/29/2019 05/23/2019 08/31/2018 04/06/2018 02/16/2018 01/19/2018 10/18/2017  PHQ - 2 Score 0 0 0 0 0 0 0    Assessment:  Goals Addressed            This Visit's Progress   . Make and Keep All Appointments       Timeframe:  Long-Range Goal Priority:  Medium Start Date:      04/25/20                       Expected End Date:  4/31/22                     Follow Up Date 05/06/20   - call to cancel if needed - keep a calendar with appointment dates    Why is this important?    Part of staying healthy is seeing the doctor for follow-up care.   If you forget your appointments, there are some things you can do to stay on track.    Notes:  Assisted with scheduling post discharge appointment encouraged to write down on calendar. She has been sent [facility] calendar     . Manage My Medicine       Timeframe:  Long-Range Goal Priority:  High Start Date:   04/25/20                          Expected End Date: 4/31/22                      Follow Up Date 05/06/20   - call for medicine refill 2 or 3 days before it runs out - keep a list of all the medicines I take; vitamins and herbals too - use a pillbox to sort medicine    Why is this important?   . These steps will help you keep on track with your medicines.   Notes:  Reviewed MD updated medication list, with teachback, will send After visit summary     . Monitor and Manage My Blood Sugar-Diabetes Type 2       Timeframe:  Long-Range Goal Priority:  High Start Date:    04/25/20                         Expected End Date:    06/06/20                   Follow Up Date 05/06/20   - check blood sugar at prescribed times - check blood sugar if I feel it is too high or too low - take the blood sugar meter to all doctor visits encouraged    Why is this important?    Checking your blood sugar at home helps to keep it from getting very high or very low.   Writing the results in a diary or log helps the doctor know how to  care for you.   Your blood sugar log should have the time, date and the results.   Also, write down the amount of insulin or other medicine that you take.   Other information, like what you ate, exercise done and how you were feeling, will also be helpful.     Notes: Call to provider office regarding patient need for meter. Reviewed s&s of hypo/hyperglycemia and checking blood sugar. Reinforced checking blood sugar at least 3 times a day, before meals, bedtime.     . Set My Target A1C-Diabetes Type 2       Timeframe:  Long-Range Goal Priority:  Medium Start Date:   04/25/20                          Expected End Date:   4/31/22                    Follow Up Date 05/06/20   - set target A1C    Why is this important?    Your target A1C is decided together by you and your doctor.   It is based on several things like your age and other health issues.    Notes:  Reviewed with patient current A1c, goal for Diabetes control 7.0 , discussed frequency of follow up A1c       Plan:  Follow-up:  Patient agrees to Care Plan and Follow-up. Discussed with patient assigned care coordinator will plan follow up call in the next week. Verified  patient has [facility] care Coordinator contact number if sooner concerns.  Will send PCP barrier involvement letter.  Will send patient after visit summary. Reinforced signs symptoms of hypoglycemia action plan treatment and notifying MD .   Joylene Draft, RN, BSN  [place] Management Coordinator  [phone]- Mobile [phone]- [place]

## 2020-04-25 NOTE — Telephone Encounter (Signed)
Forwarding to [place] 

## 2020-04-26 DIAGNOSIS — E1165 Type 2 diabetes mellitus with hyperglycemia: Secondary | ICD-10-CM | POA: Diagnosis not present

## 2020-04-26 DIAGNOSIS — S2241XD Multiple fractures of ribs, right side, subsequent encounter for fracture with routine healing: Secondary | ICD-10-CM | POA: Diagnosis not present

## 2020-04-26 DIAGNOSIS — N390 Urinary tract infection, site not specified: Secondary | ICD-10-CM | POA: Diagnosis not present

## 2020-04-26 DIAGNOSIS — E1122 Type 2 diabetes mellitus with diabetic chronic kidney disease: Secondary | ICD-10-CM | POA: Diagnosis not present

## 2020-04-26 DIAGNOSIS — J189 Pneumonia, unspecified organism: Secondary | ICD-10-CM | POA: Diagnosis not present

## 2020-04-26 DIAGNOSIS — I129 Hypertensive chronic kidney disease with stage 1 through stage 4 chronic kidney disease, or unspecified chronic kidney disease: Secondary | ICD-10-CM | POA: Diagnosis not present

## 2020-04-26 DIAGNOSIS — E1110 Type 2 diabetes mellitus with ketoacidosis without coma: Secondary | ICD-10-CM | POA: Diagnosis not present

## 2020-04-26 DIAGNOSIS — N1832 Chronic kidney disease, stage 3b: Secondary | ICD-10-CM | POA: Diagnosis not present

## 2020-04-26 DIAGNOSIS — D631 Anemia in chronic kidney disease: Secondary | ICD-10-CM | POA: Diagnosis not present

## 2020-04-29 DIAGNOSIS — E1165 Type 2 diabetes mellitus with hyperglycemia: Secondary | ICD-10-CM | POA: Diagnosis not present

## 2020-04-29 DIAGNOSIS — J189 Pneumonia, unspecified organism: Secondary | ICD-10-CM | POA: Diagnosis not present

## 2020-04-29 DIAGNOSIS — N1832 Chronic kidney disease, stage 3b: Secondary | ICD-10-CM | POA: Diagnosis not present

## 2020-04-29 DIAGNOSIS — E1110 Type 2 diabetes mellitus with ketoacidosis without coma: Secondary | ICD-10-CM | POA: Diagnosis not present

## 2020-04-29 DIAGNOSIS — D631 Anemia in chronic kidney disease: Secondary | ICD-10-CM | POA: Diagnosis not present

## 2020-04-29 DIAGNOSIS — I129 Hypertensive chronic kidney disease with stage 1 through stage 4 chronic kidney disease, or unspecified chronic kidney disease: Secondary | ICD-10-CM | POA: Diagnosis not present

## 2020-04-29 DIAGNOSIS — E1122 Type 2 diabetes mellitus with diabetic chronic kidney disease: Secondary | ICD-10-CM | POA: Diagnosis not present

## 2020-04-29 DIAGNOSIS — S2241XD Multiple fractures of ribs, right side, subsequent encounter for fracture with routine healing: Secondary | ICD-10-CM | POA: Diagnosis not present

## 2020-04-29 DIAGNOSIS — N390 Urinary tract infection, site not specified: Secondary | ICD-10-CM | POA: Diagnosis not present

## 2020-05-01 ENCOUNTER — Other Ambulatory Visit: Payer: Self-pay

## 2020-05-01 NOTE — Patient Outreach (Signed)
[facility]) Care Management  [place]  05/01/2020   Rachel Gonzalez August 23, 1950 [id number]  Subjective: Telephone call to patient for follow up. Patient reports her blood sugars have been up and down.  She is checking them twice a day right now. She states that her sugar this am was 56 and she had tea and some crackers and orange. She states she had not rechecked it. Advised patient to recheck her sugars after being low to make sure it has come back up. Also discussed importance of limiting carbohydrates and eating snacks at night to keep her sugars from dropping.  She verbalized understanding.  Objective:   Encounter Medications:  Outpatient Encounter Medications as of 05/01/2020  Medication Sig Note   Accu-Chek Softclix Lancets lancets Use 1-4 times daily as needed DX E11.9    acetaminophen (TYLENOL) 500 MG tablet Take 500-1,000 mg by mouth every 6 (six) hours as needed (pain.).    acetaminophen-codeine (TYLENOL #3) 300-30 MG tablet TAKE 1 TABLET BY MOUTH EVERY 6 HOURS AS NEEDED FOR MODERATE PAIN (Patient not taking: Reported on 04/25/2020)    atorvastatin (LIPITOR) 20 MG tablet Take 1 tablet (20 mg total) by mouth daily.    Blood Glucose Monitoring Suppl (ACCU-CHEK GUIDE) w/Device KIT 1 Device by Does not apply route 4 (four) times daily.    cetirizine (ZYRTEC ALLERGY) 10 MG tablet Take 1 tablet (10 mg total) by mouth daily. (Patient not taking: Reported on 04/25/2020)    Dulaglutide (TRULICITY) 6.73 AL/9.3XT SOPN Inject 0.75 mg into the skin once a week. (Patient not taking: Reported on 04/25/2020) 04/10/2020: Unknown specific day    furosemide (LASIX) 20 MG tablet Take 1 tablet (20 mg total) by mouth daily.    glucose blood (ACCU-CHEK GUIDE) test strip Use as instructed    insulin glargine (LANTUS SOLOSTAR) 100 UNIT/ML Solostar Pen Inject 32 Units into the skin 2 (two) times daily. 04/25/2020: Reports taking 22 units at hs    lisinopril (ZESTRIL) 5 MG  tablet Take 1 tablet (5 mg total) by mouth daily.    omeprazole (PRILOSEC) 20 MG capsule Take 1 capsule (20 mg total) by mouth daily.    ondansetron (ZOFRAN) 8 MG tablet Take 1 tablet (8 mg total) by mouth every 8 (eight) hours as needed for nausea or vomiting. (Patient not taking: Reported on 04/25/2020)    RELION PEN NEEDLE 31G/8MM 31G X 8 MM MISC USE AS DIRECTED 4 TIMES DAILY    Triamcinolone Acetonide (TRIAMCINOLONE 0.1 % CREAM : EUCERIN) CREA Apply 1 application topically 3 (three) times daily as needed. (Patient not taking: Reported on 04/25/2020)    No facility-administered encounter medications on file as of 05/01/2020.    Functional Status:  In your present state of health, do you have any difficulty performing the following activities: 04/24/2020 04/11/2020  Hearing? N N  Vision? N N  Comment has glaucoma -  Difficulty concentrating or making decisions? N Y  Walking or climbing stairs? Y N  Comment uses cane -  Dressing or bathing? N Y  Doing errands, shopping? Y N  Comment husband assist with transportation -  Conservation officer, nature and eating ? Y -  Comment husband helps -  Using the Toilet? N -  In the past six months, have you accidently leaked urine? N -  Do you have problems with loss of bowel control? N -  Managing your Medications? N -  Managing your Finances? N -  Housekeeping or managing your Housekeeping? Y -  Comment husband helps -  Some recent data might be hidden    Fall/Depression Screening: Fall Risk  04/24/2020 08/29/2019 05/23/2019  Falls in the past year? 1 0 1  Number falls in past yr: 1 - 1  Injury with Fall? 1 - -  Risk for fall due to : History of fall(s) - -  Follow up Education provided;Falls prevention discussed - -   PHQ 2/9 Scores 08/29/2019 05/23/2019 08/31/2018 04/06/2018 02/16/2018 01/19/2018 10/18/2017  PHQ - 2 Score 0 0 0 0 0 0 0    Assessment:  Goals Addressed            This Visit's Progress    Make and Keep All Appointments        Timeframe:  Long-Range Goal Priority:  Medium Start Date:      04/25/20                       Expected End Date:  11/06/20                     Follow Up Date 06/06/20   - arrange a ride through an agency 1 week before appointment    Why is this important?    Part of staying healthy is seeing the doctor for follow-up care.   If you forget your appointments, there are some things you can do to stay on track.    Notes:  Assisted with scheduling post discharge appointment encouraged to write down on calendar. She has been sent [facility] calendar.  05/01/20-patient has follow up with PCP 05-07-20     Monitor and Manage My Blood Sugar-Diabetes Type 2       Timeframe:  Long-Range Goal Priority:  High Start Date:    04/25/20                         Expected End Date:    11/06/20                   Follow Up Date 06/06/20   - check blood sugar at prescribed times - check blood sugar if I feel it is too high or too low - take the blood sugar log to all doctor visits encouraged    Why is this important?    Checking your blood sugar at home helps to keep it from getting very high or very low.   Writing the results in a diary or log helps the doctor know how to care for you.   Your blood sugar log should have the time, date and the results.   Also, write down the amount of insulin or other medicine that you take.   Other information, like what you ate, exercise done and how you were feeling, will also be helpful.     Notes: Call to provider office regarding patient need for meter. Reviewed s&s of hypo/hyperglycemia and checking blood sugar. Reinforced checking blood sugar at least 3 times a day, before meals, bedtime. 05/01/20- patient checking sugars twice a day. Encouraged patient to recheck sugars a couple of hours after a low sugar.      Set My Target A1C-Diabetes Type 2       Timeframe:  Long-Range Goal Priority:  Medium Start Date:   04/25/20                          Expected End  Date:    4/31/22                    Follow Up Date 06/06/20   - set target A1C    Why is this important?    Your target A1C is decided together by you and your doctor.   It is based on several things like your age and other health issues.    Notes:  Reviewed with patient current A1c, goal for Diabetes control 7.0 , discussed frequency of follow up A1c.  05-01-20 Discussed goal with patient and encouraged discussing with PCP.       Plan: RN CM will contact in the month of March  Follow-up:  Patient agrees to Care Plan and Follow-up.   [name], RN, MSN [facility] Care Management Coordinator Direct Line [phone] Cell [phone] Toll Free: [phone]  Fax: [phone]

## 2020-05-01 NOTE — Patient Outreach (Signed)
[facility]) Care Management  05/01/2020  Rachel Gonzalez [birth year] [id number]  Telephone cal to patient for follow up. No answer.  HIPAA compliant voice message left.    Plan: RN CM will attempt again within 4 business days and send letter.   [name], RN, MSN [facility] Care Management Care Management Coordinator Direct Line [phone] Cell [phone] Toll Free: [phone]  Fax: [phone]

## 2020-05-03 ENCOUNTER — Ambulatory Visit: Payer: Medicare HMO

## 2020-05-03 DIAGNOSIS — N1832 Chronic kidney disease, stage 3b: Secondary | ICD-10-CM | POA: Diagnosis not present

## 2020-05-03 DIAGNOSIS — I129 Hypertensive chronic kidney disease with stage 1 through stage 4 chronic kidney disease, or unspecified chronic kidney disease: Secondary | ICD-10-CM | POA: Diagnosis not present

## 2020-05-03 DIAGNOSIS — E1165 Type 2 diabetes mellitus with hyperglycemia: Secondary | ICD-10-CM | POA: Diagnosis not present

## 2020-05-03 DIAGNOSIS — D631 Anemia in chronic kidney disease: Secondary | ICD-10-CM | POA: Diagnosis not present

## 2020-05-03 DIAGNOSIS — E1122 Type 2 diabetes mellitus with diabetic chronic kidney disease: Secondary | ICD-10-CM | POA: Diagnosis not present

## 2020-05-03 DIAGNOSIS — E1110 Type 2 diabetes mellitus with ketoacidosis without coma: Secondary | ICD-10-CM | POA: Diagnosis not present

## 2020-05-03 DIAGNOSIS — S2241XD Multiple fractures of ribs, right side, subsequent encounter for fracture with routine healing: Secondary | ICD-10-CM | POA: Diagnosis not present

## 2020-05-03 DIAGNOSIS — N390 Urinary tract infection, site not specified: Secondary | ICD-10-CM | POA: Diagnosis not present

## 2020-05-03 DIAGNOSIS — J189 Pneumonia, unspecified organism: Secondary | ICD-10-CM | POA: Diagnosis not present

## 2020-05-07 ENCOUNTER — Inpatient Hospital Stay: Payer: Medicare HMO | Admitting: Family Medicine

## 2020-05-07 ENCOUNTER — Encounter: Payer: Self-pay | Admitting: Family Medicine

## 2020-05-07 ENCOUNTER — Other Ambulatory Visit: Payer: Self-pay

## 2020-05-07 ENCOUNTER — Ambulatory Visit (INDEPENDENT_AMBULATORY_CARE_PROVIDER_SITE_OTHER): Payer: Medicare HMO | Admitting: Family Medicine

## 2020-05-07 VITALS — BP 196/86 | HR 79 | Temp 97.6°F | Ht 64.0 in | Wt 224.0 lb

## 2020-05-07 DIAGNOSIS — E1110 Type 2 diabetes mellitus with ketoacidosis without coma: Secondary | ICD-10-CM | POA: Diagnosis not present

## 2020-05-07 DIAGNOSIS — E1122 Type 2 diabetes mellitus with diabetic chronic kidney disease: Secondary | ICD-10-CM | POA: Diagnosis not present

## 2020-05-07 DIAGNOSIS — I129 Hypertensive chronic kidney disease with stage 1 through stage 4 chronic kidney disease, or unspecified chronic kidney disease: Secondary | ICD-10-CM | POA: Diagnosis not present

## 2020-05-07 DIAGNOSIS — L97921 Non-pressure chronic ulcer of unspecified part of left lower leg limited to breakdown of skin: Secondary | ICD-10-CM

## 2020-05-07 DIAGNOSIS — D631 Anemia in chronic kidney disease: Secondary | ICD-10-CM | POA: Diagnosis not present

## 2020-05-07 DIAGNOSIS — J189 Pneumonia, unspecified organism: Secondary | ICD-10-CM | POA: Diagnosis not present

## 2020-05-07 DIAGNOSIS — N1832 Chronic kidney disease, stage 3b: Secondary | ICD-10-CM | POA: Diagnosis not present

## 2020-05-07 DIAGNOSIS — I10 Essential (primary) hypertension: Secondary | ICD-10-CM | POA: Diagnosis not present

## 2020-05-07 DIAGNOSIS — N390 Urinary tract infection, site not specified: Secondary | ICD-10-CM | POA: Diagnosis not present

## 2020-05-07 DIAGNOSIS — E1165 Type 2 diabetes mellitus with hyperglycemia: Secondary | ICD-10-CM | POA: Diagnosis not present

## 2020-05-07 DIAGNOSIS — S2241XD Multiple fractures of ribs, right side, subsequent encounter for fracture with routine healing: Secondary | ICD-10-CM | POA: Diagnosis not present

## 2020-05-07 LAB — GLUCOSE, POCT (MANUAL RESULT ENTRY): POC Glucose: 247 mg/dl — AB (ref 70–99)

## 2020-05-07 MED ORDER — CLONIDINE HCL 0.2 MG PO TABS
0.2000 mg | ORAL_TABLET | Freq: Once | ORAL | Status: AC
  Administered 2020-05-07: 0.2 mg via ORAL

## 2020-05-07 MED ORDER — LISINOPRIL 10 MG PO TABS: 10.0000 mg | ORAL_TABLET | Freq: Every day | ORAL | 1 refills | Status: DC

## 2020-05-07 NOTE — Patient Instructions (Addendum)
Decrease Lantus due to hypoglycemia in am. You will take 25 units in the morning and 20 units at bedtime.  Return in 1 week for blood pressure check and glucose evaluation. Patient is to bring her glucometer to appointment.  Lisinopril will be increased to 10 mg daily Continue furosemide 20 mg daily A referral will be sent to endocrinology  - Continue medication, monitor blood pressure at home. Continue DASH diet. Reminder to go to the ER if any CP, SOB, nausea, dizziness, severe HA, changes vision/speech, left arm numbness and tingling and jaw pain.    The patient was given clear instructions to go to ER or return to medical center if symptoms do not improve, worsen or new problems develop. The patient verbalized understanding.       Hyperglycemia Hyperglycemia is when the sugar (glucose) level in your blood is too high. High blood sugar can happen to people who have or do not have diabetes. High blood sugar can happen quickly. It can be an emergency. What are the causes? If you have diabetes, high blood sugar may be caused by:  Medicines that increase blood sugar or affect your control of diabetes.  Getting less physical activity.  Overeating.  Being sick or injured or having an infection.  Having surgery.  Stress.  Not giving yourself enough insulin (if you are taking it). You may have high blood sugar because you have diabetes that has not been diagnosed yet. If you do not have diabetes, high blood sugar may be caused by:  Certain medicines.  Stress.  A bad illness.  An infection.  Having surgery.  Diseases of the pancreas. What increases the risk? This condition is more likely to develop in people who have risk factors for diabetes, such as:  Having a family member with diabetes.  Certain conditions in which the body's defense system (immune system) attacks itself. These are called autoimmune disorders.  Being overweight.  Not being active.  Having a  condition called insulin resistance.  Having a history of: ? Prediabetes. ? Diabetes when pregnant. ? Polycystic ovarian syndrome (PCOS). What are the signs or symptoms? This condition may not cause symptoms. If you do have symptoms, they may include:  Feeling more thirsty than normal.  Needing to pee (urinate) more often than normal.  Hunger.  Feeling very tired.  Blurry eyesight (vision). You may get other symptoms as the condition gets worse, such as:  Dry mouth.  Pain in your belly (abdomen).  Not being hungry (loss of appetite).  Breath that smells fruity.  Weakness.  Weight loss that is not planned.  A tingling or numb feeling in your hands or feet.  A headache.  Cuts or bruises that heal slowly. How is this treated? Treatment depends on the cause of your condition. Treatment may include:  Taking medicine to control your blood sugar levels.  Changing your medicine or dosage if you take insulin or other diabetes medicines.  Lifestyle changes. These may include: ? Exercising more. ? Eating healthier foods. ? Losing weight.  Treating an illness or infection.  Checking your blood sugar more often.  Stopping or reducing steroid medicines. If your condition gets very bad, you will need to be treated in the hospital. Follow these instructions at home: General instructions  Take over-the-counter and prescription medicines only as told by your doctor.  Do not smoke or use any products that contain nicotine or tobacco. If you need help quitting, ask your doctor.  If you drink alcohol: ?  Limit how much you have to:  0-1 drink a day for women who are not pregnant.  0-2 drinks a day for men. ? Know how much alcohol is in a drink. In the [place] one drink equals one 12 oz bottle of beer (355 mL), one 5 oz glass of wine (148 mL), or one 1 oz glass of hard liquor (44 mL).  Manage stress. If you need help with this, ask your doctor.  Do exercises as told  by your doctor.  Keep all follow-up visits. Eating and drinking  Stay at a healthy weight.  Make sure you drink enough fluid when you: ? Exercise. ? Get sick. ? Are in hot temperatures.  Drink enough fluid to keep your pee (urine) pale yellow.   If you have diabetes:  Know the symptoms of high blood sugar.  Follow your diabetes management plan as told by your doctor. Make sure you: ? Take insulin and medicines as told. ? Follow your exercise plan. ? Follow your meal plan. Eat on time. Do not skip meals. ? Check your blood sugar as often as told. Make sure you check before and after exercise. If you exercise longer or in a different way, check your blood sugar more often. ? Follow your sick day plan whenever you cannot eat or drink normally. Make this plan ahead of time with your doctor.  Share your diabetes management plan with people in your workplace, school, and household.  Check your pee for ketones when you are ill and as told by your doctor.  Carry a card or wear jewelry that says that you have diabetes.   Where to find more information American Diabetes Association: www.diabetes.org Contact a doctor if:  Your blood sugar level is at or above 240 mg/dL (13.3 mmol/L) for 2 days in a row.  You have problems keeping your blood sugar in your target range.  You have high blood pressure often.  You have signs of illness, such as: ? Feeling like you may vomit (feeling nauseous). ? Vomiting. ? A fever. Get help right away if:  Your blood sugar monitor reads "high" even when you are taking insulin.  You have trouble breathing.  You have a change in how you think, feel, or act (mental status).  You feel like you may vomit, and the feeling does not go away.  You cannot stop vomiting. These symptoms may be an emergency. Get medical help right away. Call your local emergency services (911 in the [place]  Do not wait to see if the symptoms will go away.  Do not drive  yourself to the hospital. Summary  Hyperglycemia is when the sugar (glucose) level in your blood is too high.  High blood sugar can happen to people who have or do not have diabetes.  Make sure you drink enough fluids and follow your meal plan. Exercise as often as told by your doctor.  Contact your doctor if you have problems keeping your blood sugar in your target range. This information is not intended to replace advice given to you by your health care provider. Make sure you discuss any questions you have with your health care provider. Document Revised: 12/08/2019 Document Reviewed: 12/08/2019 Elsevier Patient Education  2021 [place].  Hypoglycemia Hypoglycemia is when the sugar (glucose) level in your blood is too low. Low blood sugar can happen to people who have diabetes and people who do not have diabetes. Low blood sugar can happen quickly, and it can  be an emergency. What are the causes? This condition happens most often in people who have diabetes and may be caused by:  Diabetes medicine.  Not eating enough, or not eating often enough.  Doing more physical activity.  Drinking alcohol on an empty stomach. If you do not have diabetes, hypoglycemia may be caused by:  A tumor in the pancreas.  Not eating enough, or not eating for long periods at a time (fasting).  A very bad infection or illness.  Problems after having weight loss (bariatric) surgery.  Kidney failure or liver failure.  Certain medicines. What increases the risk? This condition is more likely to develop in people who:  Have diabetes and take medicines to lower their blood sugar.  Abuse alcohol.  Have a very bad illness. What are the signs or symptoms? Symptoms depend on whether your low blood sugar is mild, moderate, or very low. Mild  Hunger.  Feeling worried or nervous (anxious).  Sweating and feeling clammy.  Feeling dizzy or light-headed.  Being sleepy or having trouble  sleeping.  Feeling like you may vomit (nauseous).  A fast heartbeat.  A headache.  Blurry vision.  Being irritable or grouchy.  Tingling or loss of feeling (numbness) around your mouth, lips, or tongue.  Trouble with moving (coordination). Moderate  Confusion and poor judgment.  Behavior changes.  Weakness.  Uneven heartbeats. Very low Very low blood sugar (severe hypoglycemia) is a medical emergency. It can cause:  Fainting.  Jerky movements that you cannot control (seizure).  Loss of consciousness (coma).  Death. How is this treated? Treating low blood sugar Low blood sugar is often treated by eating or drinking something sugary right away. The snack should contain 15 grams of a fast-acting carb (carbohydrate). Options include:  4 oz (120 mL) of fruit juice.  4-6 oz (120-150 mL) of regular soda (not diet soda).  8 oz (240 mL) of low-fat milk.  Several pieces of hard candy. Check food labels to find out how many to eat for 15 grams.  1 Tbsp (15 mL) of sugar or honey. Treating low blood sugar if you have diabetes If you can think clearly and swallow safely, follow the 15:15 rule:  Take 15 grams of a fast-acting carb. Talk with your doctor about how much you should take.  Always keep a source of fast-acting carb with you, such as: ? Sugar tablets (glucose pills). Take 4 pills. ? Several pieces of hard candy. Check food labels to see how many pieces to eat for 15 grams. ? 4 oz (120 mL) of fruit juice. ? 4-6 oz (120-150 mL) of regular (not diet) soda. ? 1 Tbsp (15 mL) of honey or sugar.  Check your blood sugar 15 minutes after you take the carb.  If your blood sugar is still at or below 70 mg/dL (3.9 mmol/L), take 15 grams of a carb again.  If your blood sugar does not go above 70 mg/dL (3.9 mmol/L) after 3 tries, get help right away.  After your blood sugar goes back to normal, eat a meal or a snack within 1 hour.   Treating very low blood sugar If  your blood sugar is at or below 54 mg/dL (3 mmol/L), you have very low blood sugar, or severe hypoglycemia. This is an emergency. Get medical help right away. If you have very low blood sugar and you cannot eat or drink, you will need to be given a hormone called glucagon. A family member or friend should  learn how to check your blood sugar and how to give you glucagon. Ask your doctor if you need to have an emergency glucagon kit at home. Very low blood sugar may also need to be treated in a hospital. Follow these instructions at home: General instructions  Take over-the-counter and prescription medicines only as told by your doctor.  Stay aware of your blood sugar as told by your doctor.  If you drink alcohol: ? Limit how much you use to:  0-1 drink a day for nonpregnant women.  0-2 drinks a day for men. ? Be aware of how much alcohol is in your drink. In the [place] one drink equals one 12 oz bottle of beer (355 mL), one 5 oz glass of wine (148 mL), or one 1 oz glass of hard liquor (44 mL).  Keep all follow-up visits as told by your doctor. This is important. If you have diabetes:  Always have a rapid-acting carb (15 grams) option with you to treat low blood sugar.  Follow your diabetes care plan as told by your doctor. Make sure you: ? Know the symptoms of low blood sugar. ? Check your blood sugar as often as told by your doctor. Always check it before and after exercise. ? Always check your blood sugar before you drive. ? Take your medicines as told. ? Follow your meal plan. ? Eat on time. Do not skip meals.  Share your diabetes care plan with: ? Your work or school. ? People you live with.  Carry a card or wear jewelry that says you have diabetes.   Contact a doctor if:  You have trouble keeping your blood sugar in your target range.  You have low blood sugar often. Get help right away if:  You still have symptoms after you eat or drink something that contains 15 grams  of fast-acting carb and you cannot get your blood sugar above 70 mg/dL by following the 15:15 rule.  Your blood sugar is at or below 54 mg/dL (3 mmol/L).  You have a seizure.  You faint. These symptoms may be an emergency. Do not wait to see if the symptoms will go away. Get medical help right away. Call your local emergency services (911 in the [place] Do not drive yourself to the hospital. Summary  Hypoglycemia happens when the level of sugar (glucose) in your blood is too low.  Low blood sugar can happen to people who have diabetes and people who do not have diabetes. Low blood sugar can happen quickly, and it can be an emergency.  Make sure you know the symptoms of low blood sugar and know how to treat it.  Always keep a source of sugar (fast-acting carb) with you to treat low blood sugar. This information is not intended to replace advice given to you by your health care provider. Make sure you discuss any questions you have with your health care provider. Document Revised: 01/18/2019 Document Reviewed: 01/18/2019 Elsevier Patient Education  2021 [employer].

## 2020-05-07 NOTE — Progress Notes (Signed)
Established Patient Office Visit  Subjective:  Patient ID: Rachel Gonzalez, female    DOB: 04-21-1950  Age: 70 y.o. MRN: [id number]  CC:  Chief Complaint  Patient presents with  . Hospitalization Follow-up    Hospital follow up , discuss cbg having low reading  running 40's , she has meter with her.     HPI Rachel Gonzalez is a very pleasant 70 year old female with a medical history significant for uncontrolled diabetes mellitus, uncontrolled hypertension, arthritis, CKD stage III, and obesity presents for post hospital follow-up.  Patient was admitted to inpatient services on 04/10/2020 for hyperglycemia.  She states that she awakened that morning and felt weakness so she drank 3 cups of orange juice.  She says that she has no memory of what happened after drinking orange juice.  She was taken to the emergency department by her husband for altered mental status. Upon arrival to ER, blood glucose was 890.  While in the hospital, hemoglobin A1c was very high and did not register on analyzer.  Patient was started on Lantus 32 units in a.m. and 32 units p.m.  Patient was very weak due to a previous fall where she sustained several rib fractures.  She was discharged to rehabilitation and had a 5-day rehab admission. She states that since hospital discharge she has been experiencing very low blood sugars in the morning.  This a.m. upon awakening, her blood sugar was 46.  At that time, patient had a 15 g carbohydrate snack and included graham crackers and peanut butter.  She states that she rechecked her blood sugar 30 minutes later and it was 157.  Patient has her glucometer today, which shows a.m. hypoglycemia. Today, patient's blood pressure is markedly elevated.  She says that amlodipine was discontinued in hospital and she was started on lisinopril 5 mg.  She says that she has been taking lisinopril consistently and without interruption.  Patient was previously on 20 mg of lisinopril.  She denies any  headache, blurred vision, chest pain, shortness of breath, or lower extremity swelling.  She has also been taking furosemide 20 mg daily.    Past Medical History:  Diagnosis Date  . Anemia   . Arthritis    right finger  . Chronic renal disease, stage 3, moderately decreased glomerular filtration rate (GFR) between 30-59 mL/min/1.73 square meter (HCC)   . Diabetes mellitus   . GERD (gastroesophageal reflux disease)   . Hypertension     Past Surgical History:  Procedure Laterality Date  . AMPUTATION Right 05/08/2014   Procedure: AMPUTATION RAY, right great toe;  Surgeon: Newt Minion, MD;  Location: [place];  Service: Orthopedics;  Laterality: Right;  . arm surgery     . CESAREAN SECTION    . COLONOSCOPY    . I & D EXTREMITY Right 05/08/2014   Procedure: IRRIGATION AND DEBRIDEMENT FOOT;  Surgeon: Newt Minion, MD;  Location: [place] [facility];  Service: Orthopedics;  Laterality: Right;  . MEMBRANE PEEL Right 03/15/2018   Procedure: MEMBRANE PEEL;  Surgeon: Jalene Mullet, MD;  Location: [place];  Service: Ophthalmology;  Laterality: Right;  . PHOTOCOAGULATION WITH LASER Right 03/15/2018   Procedure: PHOTOCOAGULATION WITH LASER;  Surgeon: Jalene Mullet, MD;  Location: [facility] [place];  Service: Ophthalmology;  Laterality: Right;  . 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 Diabetes Mother     Social History   Socioeconomic History  . Marital status: Married    Spouse name: Not on file  . Number of children: Not on file  . Years of education: Not on file  . Highest education level: Not on file  Occupational History  . Not on file  Tobacco Use  . Smoking status: Never Smoker  . Smokeless tobacco: Never Used  Vaping Use  .  Vaping Use: Never used  Substance and Sexual Activity  . Alcohol use: No  . Drug use: No  . Sexual activity: Not on file  Other Topics Concern  . Not on file  Social History Narrative   From [place].   Married.   Three children, 9 grandchildren.   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   Travels to [place], [facility]   Social Determinants of Health   Financial Resource Strain: Not on file  Food Insecurity: Not on file  Transportation Needs: Not on file  Physical Activity: Not on file  Stress: Not on file  Social Connections: Not on file  Intimate Partner Violence: Not on file    Outpatient Medications Prior to Visit  Medication Sig Dispense Refill  . Accu-Chek Softclix Lancets lancets Use 1-4 times daily as needed DX E11.9 360 each 3  . acetaminophen (TYLENOL) 500 MG tablet Take 500-1,000 mg by mouth every 6 (six) hours as needed (pain.).    Marland Kitchen atorvastatin (LIPITOR) 20 MG tablet Take 1 tablet (20 mg total) by mouth daily. 90 tablet 3  . Blood Glucose Monitoring Suppl (ACCU-CHEK GUIDE) w/Device KIT 1 Device by Does not apply route 4 (four) times daily. 1 kit 0  . cetirizine (ZYRTEC ALLERGY) 10 MG tablet Take 1 tablet (10 mg total) by mouth daily. 30 tablet 0  . furosemide (LASIX) 20 MG tablet Take 1 tablet (20 mg total) by mouth daily. 30 tablet 11  . glucose blood (ACCU-CHEK GUIDE) test strip Use as instructed 100 each 12  . lisinopril (ZESTRIL) 5 MG tablet Take 1 tablet (5 mg total) by mouth daily. 30 tablet 0  . omeprazole (PRILOSEC) 20 MG capsule Take 1 capsule (20 mg total) by mouth daily. 90 capsule 2  . RELION PEN NEEDLE 31G/8MM 31G X 8 MM MISC USE AS DIRECTED 4 TIMES DAILY 100 each 11  . Triamcinolone Acetonide (TRIAMCINOLONE 0.1 % CREAM : EUCERIN) CREA Apply 1 application topically 3 (three) times daily as needed. 1 each 5  . acetaminophen-codeine (TYLENOL #3) 300-30 MG tablet TAKE 1 TABLET BY MOUTH EVERY 6 HOURS AS NEEDED  FOR MODERATE PAIN 30 tablet 0  . Dulaglutide (TRULICITY) 4.81 EH/6.3JS SOPN Inject 0.75 mg into the skin once a week. 0.5 mL 11  . insulin glargine (LANTUS SOLOSTAR) 100 UNIT/ML Solostar Pen Inject 32 Units into the skin 2 (two) times daily. 15 mL PRN  . insulin glargine (LANTUS SOLOSTAR) 100 UNIT/ML Solostar Pen Take 25 units in the morning and 20 units at bedtime. 15 mL PRN  . ondansetron (ZOFRAN) 8 MG tablet Take 1 tablet (8 mg total) by mouth every 8 (eight) hours as needed for nausea or vomiting. (Patient not taking: Reported on 04/25/2020) 20 tablet 0   No facility-administered medications prior to visit.    Allergies  Allergen Reactions  . Tramadol Nausea And Vomiting  . Adhesive [Tape] Itching  .  Bactrim [Sulfamethoxazole-Trimethoprim] Other (See Comments)    AKI and hyperkalemia   . Tylenol With Codeine #3 [Acetaminophen-Codeine] Diarrhea and Itching    Only allergic to Codeine, patient reports no allergy to Tylenol.    ROS Review of Systems  Constitutional: Negative.   HENT: Negative.   Eyes: Negative.  Negative for discharge and itching.  Respiratory: Negative.   Cardiovascular: Negative.  Negative for chest pain, palpitations and leg swelling.  Gastrointestinal: Negative.   Endocrine: Negative.  Negative for polydipsia, polyphagia and polyuria.  Genitourinary: Negative.   Musculoskeletal: Negative.   Neurological: Negative.   Psychiatric/Behavioral: Negative.       Objective:    Physical Exam Constitutional:      Appearance: She is obese.  Eyes:     Pupils: Pupils are equal, round, and reactive to light.  Cardiovascular:     Rate and Rhythm: Normal rate and regular rhythm.     Pulses: Normal pulses.  Pulmonary:     Effort: Pulmonary effort is normal.     Breath sounds: Normal breath sounds.  Abdominal:     General: Bowel sounds are normal.  Musculoskeletal:        General: Normal range of motion.  Skin:    General: Skin is warm.          Comments:  98% granulation, hyperpigmented, non draining, non tender  Neurological:     General: No focal deficit present.     Mental Status: She is alert. Mental status is at baseline.  Psychiatric:        Mood and Affect: Mood normal.        Thought Content: Thought content normal.        Judgment: Judgment normal.     BP (!) 213/71 (BP Location: Right Arm)   Pulse 79   Temp 97.6 F (36.4 C) (Temporal)   Ht _0  (1.626 m)   Wt 224 lb (101.6 kg)   SpO2 98%   BMI 38.45 kg/m  Wt Readings from Last 3 Encounters:  05/07/20 224 lb (101.6 kg)  04/10/20 225 lb (102.1 kg)  02/27/20 227 lb (103 kg)     Health Maintenance Due  Topic Date Due  . COLONOSCOPY (Pts 45-5yr Insurance coverage will need to be confirmed)  Never done  . DEXA SCAN  Never done  . MAMMOGRAM  04/22/2017  . COVID-19 Vaccine (3 - Booster for Pfizer series) 12/07/2019  . OPHTHALMOLOGY EXAM  01/06/2020    There are no preventive care reminders to display for this patient.  Lab Results  Component Value Date   TSH 0.838 04/10/2020   Lab Results  Component Value Date   WBC 6.0 04/15/2020   HGB 11.3 (L) 04/15/2020   HCT 36.6 04/15/2020   MCV 86.1 04/15/2020   PLT 172 04/15/2020   Lab Results  Component Value Date   NA 140 04/16/2020   K 4.4 04/16/2020   CO2 24 04/16/2020   GLUCOSE 75 04/16/2020   BUN 26 (H) 04/16/2020   CREATININE 1.58 (H) 04/16/2020   BILITOT 0.5 04/12/2020   ALKPHOS 142 (H) 04/12/2020   AST 14 (L) 04/12/2020   ALT 10 04/12/2020   PROT 5.6 (L) 04/12/2020   ALBUMIN 2.6 (L) 04/12/2020   CALCIUM 9.4 04/16/2020   ANIONGAP 10 04/16/2020   GFR 63.19 11/07/2014   Lab Results  Component Value Date   CHOL 156 06/26/2019   Lab Results  Component Value Date   HDL 78 06/26/2019   Lab Results  Component Value Date   LDLCALC 68 06/26/2019   Lab Results  Component Value Date   TRIG 43 06/26/2019   Lab Results  Component Value Date   CHOLHDL 2.0 06/26/2019   Lab Results  Component  Value Date   HGBA1C >15.5 (H) 04/11/2020      Assessment & Plan:   Problem List Items Addressed This Visit      Cardiovascular and Mediastinum   Essential hypertension     Endocrine   Uncontrolled type 2 diabetes mellitus with hyperglycemia ([place]) - Primary   Relevant Medications   insulin glargine (LANTUS SOLOSTAR) 100 UNIT/ML Solostar Pen   Other Relevant Orders   Glucose (CBG) (Completed)      Meds ordered this encounter  Medications  . cloNIDine (CATAPRES) tablet 0.2 mg    Uncontrolled type 2 diabetes mellitus with hyperglycemia [facility]) Patient warrants an endocrinology referral. She has been referred in the past but has not established care.  Today, blood glucose was 13. She last had hemoglobin a1c 1 month ago which registered as greater than 15.  At that time, she was started on 32 units of insulin every 12 hours.  We will reduce Lantus to 25 mg in a.m. and 20 p.m. Will defer to endocrinology for further treatment and evaluation at this time.  Patient's diabetes has been very uncontrolled for a long period of time.  There has been some nonadherence to medication regimens.  There have also been issues with drug cost and insurance constraints.  Discussed the importance of medication adherence in order to achieve positive outcomes. Patient reports some episodes of hypoglycemia in am.   - Glucose (CBG) - insulin glargine (LANTUS SOLOSTAR) 100 UNIT/ML Solostar Pen; Take 25 units in the morning and 20 units at bedtime.  Dispense: 15 mL; Refill: PRN - CBC with Differential - Basic Metabolic Panel - lisinopril (ZESTRIL) 10 MG tablet; Take 1 tablet (10 mg total) by mouth daily.  Dispense: 30 tablet; Refill: 1 - CMP and Liver  Essential hypertension Blood pressure was markedly elevated on arrival.  Administered clonidine 0.2 mg.  Blood pressure trending down.  Will increase lisinopril to 10 mg daily.  Patient will continue furosemide 20 mg daily she will follow-up in 1 week for blood  pressure.- Continue medication, monitor blood pressure at home. Continue DASH diet. Reminder to go to the ER if any CP, SOB, nausea, dizziness, severe HA, changes vision/speech, left arm numbness and tingling and jaw pain. - cloNIDine (CATAPRES) tablet 0.2 mg - CBC with Differential - lisinopril (ZESTRIL) 10 MG tablet; Take 1 tablet (10 mg total) by mouth daily.  Dispense: 30 tablet; Refill: 1 - CMP and Liver  Ulcer of left lower extremity, limited to breakdown of skin ([place]) Patient has healing ulcers to left lower extremities.  Ulcers are extremely hyperpigmented.  Discussed the importance of gaining greater control of type 2 diabetes in order to prevent further ulcers.  Discussed microvascular complications that can be caused by uncontrolled diabetes mellitus at length.  Patient also given written information.  Also, discussed the importance of paying close attention to lower extremities and watch for any skin breakdown.  Lower extremity ulcers are 95% granulated and do not warrant any further treatment.  Follow-up: Patient will follow up in 1 week for DMII and hypertension   Donia Pounds  APRN, MSN, FNP-C Patient [facility] [address] [place], [place] [address] [phone]

## 2020-05-08 ENCOUNTER — Telehealth: Payer: Self-pay | Admitting: Family Medicine

## 2020-05-08 NOTE — Telephone Encounter (Signed)
Pt states provider lowered dosage insulin at last visit.  Pt calling to report last night before bed sugar was 210.  Pt states woke up 4:45 am feeling bad and checked sugar & it was 67.  Please advise.

## 2020-05-08 NOTE — Telephone Encounter (Signed)
Forwarding to [place]

## 2020-05-08 NOTE — Telephone Encounter (Signed)
Rachel Gonzalez is a 70 year old female with a medical history significant for uncontrolled diabetes mellitus that called concerning hypoglycemia this a.m. upon awakening.  Patient awakened around 5 AM and check blood sugar which was 67.  Patient had symptoms of lightheadedness and a general feeling of being "unwell".  She denies any excessive sweating, irritability, or blurred vision.  Patient ate a 15 g carbohydrate snack and blood sugar normalized.  During office visit on 05/07/2020, Lantus was lowered to 25 units in the morning and 20.  Patient advised to follow medication regimen consistently in order to achieve positive outcomes.  Also, recommend a 15 g carbohydrate snack prior to bedtime.  Patient will follow-up in clinic on 05/14/2020 and at that time she will bring glucometer.   Nolon Nations  APRN, MSN, FNP-C Patient Care [facility] [address] [place], [name] [address] [phone]

## 2020-05-09 ENCOUNTER — Telehealth: Payer: Self-pay

## 2020-05-09 NOTE — Telephone Encounter (Signed)
Per [facility] this pt has been DISMISSED from practice and cannot complete the referral WE sent them.  If questions please call their billing office.

## 2020-05-10 DIAGNOSIS — J189 Pneumonia, unspecified organism: Secondary | ICD-10-CM | POA: Diagnosis not present

## 2020-05-10 DIAGNOSIS — N1832 Chronic kidney disease, stage 3b: Secondary | ICD-10-CM | POA: Diagnosis not present

## 2020-05-10 DIAGNOSIS — D631 Anemia in chronic kidney disease: Secondary | ICD-10-CM | POA: Diagnosis not present

## 2020-05-10 DIAGNOSIS — E1110 Type 2 diabetes mellitus with ketoacidosis without coma: Secondary | ICD-10-CM | POA: Diagnosis not present

## 2020-05-10 DIAGNOSIS — E1122 Type 2 diabetes mellitus with diabetic chronic kidney disease: Secondary | ICD-10-CM | POA: Diagnosis not present

## 2020-05-10 DIAGNOSIS — I129 Hypertensive chronic kidney disease with stage 1 through stage 4 chronic kidney disease, or unspecified chronic kidney disease: Secondary | ICD-10-CM | POA: Diagnosis not present

## 2020-05-10 DIAGNOSIS — S2241XD Multiple fractures of ribs, right side, subsequent encounter for fracture with routine healing: Secondary | ICD-10-CM | POA: Diagnosis not present

## 2020-05-10 DIAGNOSIS — N390 Urinary tract infection, site not specified: Secondary | ICD-10-CM | POA: Diagnosis not present

## 2020-05-10 DIAGNOSIS — E1165 Type 2 diabetes mellitus with hyperglycemia: Secondary | ICD-10-CM | POA: Diagnosis not present

## 2020-05-14 ENCOUNTER — Ambulatory Visit (INDEPENDENT_AMBULATORY_CARE_PROVIDER_SITE_OTHER): Payer: Medicare HMO | Admitting: Family Medicine

## 2020-05-14 ENCOUNTER — Other Ambulatory Visit: Payer: Self-pay

## 2020-05-14 VITALS — BP 164/90 | HR 88 | Temp 97.5°F | Ht 64.0 in | Wt 222.0 lb

## 2020-05-14 DIAGNOSIS — T383X5A Adverse effect of insulin and oral hypoglycemic [antidiabetic] drugs, initial encounter: Secondary | ICD-10-CM

## 2020-05-14 DIAGNOSIS — I10 Essential (primary) hypertension: Secondary | ICD-10-CM | POA: Diagnosis not present

## 2020-05-14 DIAGNOSIS — E1165 Type 2 diabetes mellitus with hyperglycemia: Secondary | ICD-10-CM

## 2020-05-14 DIAGNOSIS — E1140 Type 2 diabetes mellitus with diabetic neuropathy, unspecified: Secondary | ICD-10-CM

## 2020-05-14 DIAGNOSIS — E160 Drug-induced hypoglycemia without coma: Secondary | ICD-10-CM

## 2020-05-14 MED ORDER — GABAPENTIN 300 MG PO CAPS: 300.0000 mg | ORAL_CAPSULE | Freq: Two times a day (BID) | ORAL | 1 refills | Status: DC

## 2020-05-14 MED ORDER — AMLODIPINE BESYLATE 5 MG PO TABS: 5.0000 mg | ORAL_TABLET | Freq: Every day | ORAL | 0 refills | Status: DC

## 2020-05-14 NOTE — Patient Outreach (Signed)
[facility]) Care Management  [place]  05/14/2020   Rachel Gonzalez [birth year] [id number]  Subjective: Telephone call to patient for follow up. Patient still having lows with sugars.  She reports it was 45 this AM.  Saw doctor today and Lantus decreased. Discussed importance of diabetes management. Patient to be referred to endocrinology by PCP. Patient blood pressure has been elevated as well.  Norvasc added to better control.  Home Health nurse will be checking her blood pressure.  Patient has follow up with PCP next week.  Patient voices no concerns.     Objective:   Encounter Medications:  Outpatient Encounter Medications as of 05/14/2020  Medication Sig  . Accu-Chek Softclix Lancets lancets Use 1-4 times daily as needed DX E11.9  . acetaminophen (TYLENOL) 500 MG tablet Take 500-1,000 mg by mouth every 6 (six) hours as needed (pain.).  Marland Kitchen amLODipine (NORVASC) 5 MG tablet Take 1 tablet (5 mg total) by mouth daily.  Marland Kitchen atorvastatin (LIPITOR) 20 MG tablet Take 1 tablet (20 mg total) by mouth daily.  . Blood Glucose Monitoring Suppl (ACCU-CHEK GUIDE) w/Device KIT 1 Device by Does not apply route 4 (four) times daily.  . cetirizine (ZYRTEC ALLERGY) 10 MG tablet Take 1 tablet (10 mg total) by mouth daily.  . furosemide (LASIX) 20 MG tablet Take 1 tablet (20 mg total) by mouth daily.  Marland Kitchen gabapentin (NEURONTIN) 300 MG capsule Take 1 capsule (300 mg total) by mouth 2 (two) times daily.  Marland Kitchen glucose blood (ACCU-CHEK GUIDE) test strip Use as instructed  . insulin glargine (LANTUS SOLOSTAR) 100 UNIT/ML Solostar Pen Take 15 units in the morning and 10 units at bedtime.  Marland Kitchen lisinopril (ZESTRIL) 10 MG tablet Take 1 tablet (10 mg total) by mouth daily.  Marland Kitchen omeprazole (PRILOSEC) 20 MG capsule Take 1 capsule (20 mg total) by mouth daily.  Marland Kitchen RELION PEN NEEDLE 31G/8MM 31G X 8 MM MISC USE AS DIRECTED 4 TIMES DAILY  . Triamcinolone Acetonide (TRIAMCINOLONE 0.1 % CREAM : EUCERIN) CREA  Apply 1 application topically 3 (three) times daily as needed.   No facility-administered encounter medications on file as of 05/14/2020.    Functional Status:  In your present state of health, do you have any difficulty performing the following activities: 04/24/2020 04/11/2020  Hearing? N N  Vision? N N  Comment has glaucoma -  Difficulty concentrating or making decisions? N Y  Walking or climbing stairs? Y N  Comment uses cane -  Dressing or bathing? N Y  Doing errands, shopping? Y N  Comment husband assist with transportation -  Conservation officer, nature and eating ? Y -  Comment husband helps -  Using the Toilet? N -  In the past six months, have you accidently leaked urine? N -  Do you have problems with loss of bowel control? N -  Managing your Medications? N -  Managing your Finances? N -  Housekeeping or managing your Housekeeping? Y -  Comment husband helps -  Some recent data might be hidden    Fall/Depression Screening: Fall Risk  05/07/2020 04/24/2020 08/29/2019  Falls in the past year? 1 1 0  Number falls in past yr: - 1 -  Injury with Fall? - 1 -  Risk for fall due to : - History of fall(s) -  Follow up Falls evaluation completed Education provided;Falls prevention discussed -   PHQ 2/9 Scores 05/07/2020 05/01/2020 08/29/2019 05/23/2019 08/31/2018 04/06/2018 02/16/2018  PHQ - 2 Score 0 0  0 0 0 0 0    Assessment:  Goals Addressed            This Visit's Progress   . Make and Keep All Appointments   On track    Timeframe:  Long-Range Goal Priority:  Medium Start Date:      04/25/20                       Expected End Date:  11/06/20                     Follow Up Date 06/06/20   - arrange a ride through an agency 1 week before appointment    Why is this important?    Part of staying healthy is seeing the doctor for follow-up care.   If you forget your appointments, there are some things you can do to stay on track.    Notes:  Assisted with scheduling post discharge  appointment encouraged to write down on calendar. She has been sent [facility] calendar.  05/01/20-patient has follow up with PCP 05-07-20  05/14/20 PCP appointment today.     . Manage My Medicine   On track    Timeframe:  Long-Range Goal Priority:  High Start Date:   04/25/20                          Expected End Date: 4/31/22                      Follow Up Date 06/06/20 - call for medicine refill 2 or 3 days before it runs out - call if I am sick and can't take my medicine - keep a list of all the medicines I take; vitamins and herbals too    Why is this important?   . These steps will help you keep on track with your medicines.   Notes:  Reviewed MD updated medication list, with teachback, will send After visit summary  05/14/20 patient reports taking medications as prescribed.    . Monitor and Manage My Blood Sugar-Diabetes Type 2   On track    Timeframe:  Long-Range Goal Priority:  High Start Date:    04/25/20                         Expected End Date:    11/06/20                   Follow Up Date 06/06/20   - check blood sugar at prescribed times - check blood sugar if I feel it is too high or too low - enter blood sugar readings and medication or insulin into daily log - take the blood sugar meter to all doctor visits encouraged    Why is this important?    Checking your blood sugar at home helps to keep it from getting very high or very low.   Writing the results in a diary or log helps the doctor know how to care for you.   Your blood sugar log should have the time, date and the results.   Also, write down the amount of insulin or other medicine that you take.   Other information, like what you ate, exercise done and how you were feeling, will also be helpful.     Notes: Call to provider office regarding patient need for meter. Reviewed  s&s of hypo/hyperglycemia and checking blood sugar. Reinforced checking blood sugar at least 3 times a day, before meals, bedtime. 05/01/20- patient  checking sugars twice a day. Encouraged patient to recheck sugars a couple of hours after a low sugar.  05/14/20 patient checking sugars but still dealing with lows.  Lantus recently decreased.      . COMPLETED: Set My Target A1C-Diabetes Type 2   On track    Timeframe:  Long-Range Goal Priority:  Medium Start Date:   04/25/20                          Expected End Date:   4/31/22                    Follow Up Date 06/06/20   - set target A1C    Why is this important?    Your target A1C is decided together by you and your doctor.   It is based on several things like your age and other health issues.    Notes:  Reviewed with patient current A1c, goal for Diabetes control 7.0 , discussed frequency of follow up A1c.  05-01-20 Discussed goal with patient and encouraged discussing with PCP.       Plan: RN CM will follow up again this month.   Follow-up:  Patient agrees to Care Plan and Follow-up.   [name], RN, MSN [place] Management Care Management Coordinator Direct Line [phone] Cell [phone] Toll Free: [phone]  Fax: [phone]

## 2020-05-14 NOTE — Patient Instructions (Addendum)
We will decrease your Lantus to 15 units in the morning and 10 units at bedtime.  Continue to eat a 15 g carbohydrate snack prior to bedtime. Will add Norvasc 5 mg daily for greater blood pressure control  Return in 1 week for BP check or get your home health nurse to call in manual blood pressure     Diabetes Mellitus Action Plan Following a diabetes action plan is a way for you to manage your diabetes (diabetes mellitus) symptoms. The plan is color-coded to help you understand what actions you need to take based on any symptoms you are having.  If you have symptoms in the red zone, you need medical care right away.  If you have symptoms in the yellow zone, you are having problems.  If you have symptoms in the green zone, you are doing well. Learning about and understanding diabetes can take time. Follow the plan that you develop with your health care provider. Know the target range for your blood sugar (glucose) level, and review your treatment plan with your health care provider at each visit. The target range for my blood sugar level is __________________________ mg/dL. Red zone Get medical help right away if you have any of the following symptoms:  A blood sugar test result that is below 54 mg/dL (3 mmol/L).  A blood sugar test result that is at or above 240 mg/dL (36.6 mmol/L) for 2 days in a row.  Confusion or trouble thinking clearly.  Difficulty breathing.  Sickness or a fever for 2 or more days that is not getting better.  Moderate or large ketone levels in your urine.  Feeling tired or having no energy. If you have any red zone symptoms, do not wait to see if the symptoms will go away. Get medical help right away. Call your local emergency services (911 in the [place] Do not drive yourself to the hospital. If you have severely low blood sugar (severe hypoglycemia) and you cannot eat or drink, you may need glucagon. Make sure a family member or close friend knows how to  check your blood sugar and how to give you glucagon. You may need to be treated in a hospital for this condition.   Yellow zone If you have any of the following symptoms, your diabetes is not under control and you may need to make some changes:  A blood sugar test result that is at or above 240 mg/dL (29.4 mmol/L) for 2 days in a row.  Blood sugar test results that are below 70 mg/dL (3.9 mmol/L).  Other symptoms of hypoglycemia, such as: ? Shaking or feeling light-headed. ? Confusion or irritability. ? Feeling hungry. ? Having a fast heartbeat. If you have any yellow zone symptoms:  Treat your hypoglycemia by eating or drinking 15 grams of a rapid-acting carbohydrate. Follow the 15:15 rule: ? Take 15 grams of a rapid-acting carbohydrate, such as:  1 tube of glucose gel.  4 glucose pills.  4 oz (120 mL) of fruit juice.  4 oz (120 mL) of regular (not diet) soda. ? Check your blood sugar 15 minutes after you take the carbohydrate. ? If the repeat blood sugar test is still at or below 70 mg/dL (3.9 mmol/L), take 15 grams of a carbohydrate again. ? If your blood sugar does not increase above 70 mg/dL (3.9 mmol/L) after 3 tries, get medical help right away. ? After your blood sugar returns to normal, eat a meal or a snack within 1 hour.  Keep taking your daily medicines as told by your health care provider.  Check your blood sugar more often than you normally would. ? Write down your results. ? Call your health care provider if you have trouble keeping your blood sugar in your target range.   Green zone These signs mean you are doing well and you can continue what you are doing to manage your diabetes:  Your blood sugar is within your personal target range. For most people, a blood sugar level before a meal (preprandial) should be 80-130 mg/dL (8.5-6.3 mmol/L).  You feel well, and you are able to do daily activities. If you are in the green zone, continue to manage your diabetes  as told by your health care provider. To do this:  Eat a healthy diet.  Exercise regularly.  Check your blood sugar as told by your health care provider.  Take your medicines as told by your health care provider.   Where to find more information  American Diabetes Association (ADA): diabetes.org  Association of Diabetes Care & Education Specialists (ADCES): diabeteseducator.org Summary  Following a diabetes action plan is a way for you to manage your diabetes symptoms. The plan is color-coded to help you understand what actions you need to take based on any symptoms you are having.  Follow the plan that you develop with your health care provider. Make sure you know your personal target blood sugar level.  Review your treatment plan with your health care provider at each visit. This information is not intended to replace advice given to you by your health care provider. Make sure you discuss any questions you have with your health care provider. Document Revised: 08/31/2019 Document Reviewed: 08/31/2019 Elsevier Patient Education  2021 [employer].

## 2020-05-14 NOTE — Progress Notes (Signed)
[facility]   Established Patient Office Visit  Subjective:  Patient ID: Rachel Gonzalez, female    DOB: [birth year]  Age: 70 y.o. MRN: [id number]  CC:  Chief Complaint  Patient presents with  . Follow-up    1 week follow up Running in 40 and 50  taking 20 units and 25 units at night and still running low     HPI Rachel Gonzalez is a very pleasant 70 year old female with a medical history significant for uncontrolled type 2 diabetes mellitus, accelerated hypertension, CKD stage III, and hypertension presents for follow-up of type 2 diabetes mellitus.  Patient was evaluated in clinic 1 week ago and was found to have extreme hypoglycemia upon awakening.  Blood sugars range from 45-60s.  Patient states that on yesterday morning she awakened "jittery and sweaty".  Patient's Lantus was decreased on last week to 25 units in a.m. and 20 units p.m. also, it was recommended that patient eat a 15 g carbohydrate snack at bedtime.  She currently denies any headache, blurred vision, urinary frequency, urgency, increased hunger or thirst.  Patient endorses continued numbness and tingling to fingertips and toes.  She was previously on gabapentin for neuropathy, but discontinued medication about a month ago after running out.  Past Medical History:  Diagnosis Date  . Anemia   . Arthritis    right finger  . Chronic renal disease, stage 3, moderately decreased glomerular filtration rate (GFR) between 30-59 mL/min/1.73 square meter (HCC)   . Diabetes mellitus   . GERD (gastroesophageal reflux disease)   . Hypertension     Past Surgical History:  Procedure Laterality Date  . AMPUTATION Right 05/08/2014   Procedure: AMPUTATION RAY, right great toe;  Surgeon: Newt Minion, MD;  Location: [facility];  Service: Orthopedics;  Laterality: Right;  . arm surgery     . CESAREAN SECTION    . COLONOSCOPY    . I & D EXTREMITY Right 05/08/2014   Procedure: IRRIGATION  AND DEBRIDEMENT FOOT;  Surgeon: Newt Minion, MD;  Location: [facility];  Service: Orthopedics;  Laterality: Right;  . MEMBRANE PEEL Right 03/15/2018   Procedure: MEMBRANE PEEL;  Surgeon: Jalene Mullet, MD;  Location: [facility];  Service: Ophthalmology;  Laterality: Right;  . PHOTOCOAGULATION WITH LASER Right 03/15/2018   Procedure: PHOTOCOAGULATION WITH LASER;  Surgeon: Jalene Mullet, MD;  Location: [facility];  Service: Ophthalmology;  Laterality: Right;  . 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 Diabetes Mother     Social History   Socioeconomic History  . Marital status: Married    Spouse name: Not on file  . Number of children: Not on file  . Years of education: Not on file  . Highest education level: Not on file  Occupational History  . Not on file  Tobacco Use  . Smoking status: Never Smoker  . Smokeless tobacco: Never Used  Vaping Use  . Vaping Use: Never used  Substance and Sexual Activity  . Alcohol use: No  . Drug use: No  . Sexual activity: Not on file  Other Topics Concern  . Not on file  Social History Narrative   From [place].   Married.  Three children, 9 grandchildren.   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   Travels to [place], [facility]   Social Determinants of Health   Financial Resource Strain: Not on file  Food Insecurity: Not on file  Transportation Needs: Not on file  Physical Activity: Not on file  Stress: Not on file  Social Connections: Not on file  Intimate Partner Violence: Not on file    Outpatient Medications Prior to Visit  Medication Sig Dispense Refill  . Accu-Chek Softclix Lancets lancets Use 1-4 times daily as  needed DX E11.9 360 each 3  . acetaminophen (TYLENOL) 500 MG tablet Take 500-1,000 mg by mouth every 6 (six) hours as needed (pain.).    Marland Kitchen atorvastatin (LIPITOR) 20 MG tablet Take 1 tablet (20 mg total) by mouth daily. 90 tablet 3  . Blood Glucose Monitoring Suppl (ACCU-CHEK GUIDE) w/Device KIT 1 Device by Does not apply route 4 (four) times daily. 1 kit 0  . cetirizine (ZYRTEC ALLERGY) 10 MG tablet Take 1 tablet (10 mg total) by mouth daily. 30 tablet 0  . furosemide (LASIX) 20 MG tablet Take 1 tablet (20 mg total) by mouth daily. 30 tablet 11  . glucose blood (ACCU-CHEK GUIDE) test strip Use as instructed 100 each 12  . lisinopril (ZESTRIL) 10 MG tablet Take 1 tablet (10 mg total) by mouth daily. 30 tablet 1  . omeprazole (PRILOSEC) 20 MG capsule Take 1 capsule (20 mg total) by mouth daily. 90 capsule 2  . RELION PEN NEEDLE 31G/8MM 31G X 8 MM MISC USE AS DIRECTED 4 TIMES DAILY 100 each 11  . Triamcinolone Acetonide (TRIAMCINOLONE 0.1 % CREAM : EUCERIN) CREA Apply 1 application topically 3 (three) times daily as needed. 1 each 5  . insulin glargine (LANTUS SOLOSTAR) 100 UNIT/ML Solostar Pen Take 25 units in the morning and 20 units at bedtime. 15 mL PRN  . insulin glargine (LANTUS SOLOSTAR) 100 UNIT/ML Solostar Pen Take 15 units in the morning and 10 units at bedtime. 15 mL PRN   No facility-administered medications prior to visit.    Allergies  Allergen Reactions  . Tramadol Nausea And Vomiting  . Adhesive [Tape] Itching  . Bactrim [Sulfamethoxazole-Trimethoprim] Other (See Comments)    AKI and hyperkalemia   . Tylenol With Codeine #3 [Acetaminophen-Codeine] Diarrhea and Itching    Only allergic to Codeine, patient reports no allergy to Tylenol.    ROS Review of Systems  Constitutional: Negative for activity change, appetite change, chills and diaphoresis.  HENT: Negative.   Respiratory: Negative.   Gastrointestinal: Positive for abdominal distention.  Endocrine: Negative for  polydipsia, polyphagia and polyuria.  Musculoskeletal: Negative.   Skin: Negative.   Neurological: Positive for numbness.  Psychiatric/Behavioral: Negative.       Objective:    Physical Exam Constitutional:      Appearance: Normal appearance.  Eyes:     Pupils: Pupils are equal, round, and reactive to light.  Cardiovascular:     Rate and Rhythm: Normal rate and regular rhythm.  Pulmonary:     Effort: Pulmonary effort is normal.  Abdominal:     General: Abdomen is flat. Bowel sounds are normal.  Musculoskeletal:        General: Normal range of motion.  Neurological:     General: No focal deficit present.     Mental Status: She is alert. Mental status is at baseline.  Psychiatric:        Mood and Affect:  Mood normal.        Behavior: Behavior normal.        Thought Content: Thought content normal.        Judgment: Judgment normal.     BP (!) 193/85 (BP Location: Left Arm, Patient Position: Sitting, Cuff Size: Large)   Pulse 88   Temp (!) 97.5 F (36.4 C) (Temporal)   Ht 5' 4"  (1.626 m)   Wt 222 lb (100.7 kg)   SpO2 95%   BMI 38.11 kg/m  Wt Readings from Last 3 Encounters:  05/14/20 222 lb (100.7 kg)  05/07/20 224 lb (101.6 kg)  04/10/20 225 lb (102.1 kg)     Health Maintenance Due  Topic Date Due  . COLONOSCOPY (Pts 45-30yr Insurance coverage will need to be confirmed)  Never done  . DEXA SCAN  Never done  . MAMMOGRAM  04/22/2017  . COVID-19 Vaccine (3 - Booster for Pfizer series) 12/07/2019  . OPHTHALMOLOGY EXAM  01/06/2020    There are no preventive care reminders to display for this patient.  Lab Results  Component Value Date   TSH 0.838 04/10/2020   Lab Results  Component Value Date   WBC 6.0 04/15/2020   HGB 11.3 (L) 04/15/2020   HCT 36.6 04/15/2020   MCV 86.1 04/15/2020   PLT 172 04/15/2020   Lab Results  Component Value Date   NA 140 04/16/2020   K 4.4 04/16/2020   CO2 24 04/16/2020   GLUCOSE 75 04/16/2020   BUN 26 (H) 04/16/2020    CREATININE 1.58 (H) 04/16/2020   BILITOT 0.5 04/12/2020   ALKPHOS 142 (H) 04/12/2020   AST 14 (L) 04/12/2020   ALT 10 04/12/2020   PROT 5.6 (L) 04/12/2020   ALBUMIN 2.6 (L) 04/12/2020   CALCIUM 9.4 04/16/2020   ANIONGAP 10 04/16/2020   GFR 63.19 11/07/2014   Lab Results  Component Value Date   CHOL 156 06/26/2019   Lab Results  Component Value Date   HDL 78 06/26/2019   Lab Results  Component Value Date   LDLCALC 68 06/26/2019   Lab Results  Component Value Date   TRIG 43 06/26/2019   Lab Results  Component Value Date   CHOLHDL 2.0 06/26/2019   Lab Results  Component Value Date   HGBA1C >15.5 (H) 04/11/2020      Assessment & Plan:   Problem List Items Addressed This Visit      Cardiovascular and Mediastinum   Essential hypertension   Relevant Medications   amLODipine (NORVASC) 5 MG tablet     Endocrine   Uncontrolled type 2 diabetes mellitus with hyperglycemia (HCC) - Primary   Relevant Medications   insulin glargine (LANTUS SOLOSTAR) 100 UNIT/ML Solostar Pen   Other Relevant Orders   Glucose (CBG)      Meds ordered this encounter  Medications  . amLODipine (NORVASC) 5 MG tablet    Sig: Take 1 tablet (5 mg total) by mouth daily.    Dispense:  90 tablet    Refill:  0    Order Specific Question:   Supervising Provider    Answer:   JAngelica ChessmanE [[[id number]]   8 Uncontrolled type 2 diabetes mellitus with hyperglycemia ([place] Patient continues to have hypoglycemia in AM.  Decreased Lantus to 15 units in a.m. and 10 units at bedtime.  Also, recommend continuing 15 g carbohydrate snack at bedtime.  Patient will also eat small carbohydrate modified meals throughout the day as recommended.  Patient was referred to endocrinology  at an office appointment last week. - Glucose (CBG) - insulin glargine (LANTUS SOLOSTAR) 100 UNIT/ML Solostar Pen; Take 15 units in the morning and 10 units at bedtime.  Dispense: 15 mL; Refill: PRN  2. Hypoglycemia due to  insulin Patient is experiencing episodes of hypoglycemia primarily in AM.  Upon awakening she has noticed that blood sugars have been as low as 46.  Lantus lowered.  Also, discussed diet at length.  Given written information.  Patient may benefit from nutritional consult.  3. Essential hypertension Blood pressure markedly elevate on arrival.  Patient has been taking lisinopril 10 mg daily.  We will add amlodipine 5 mg daily.  Patient will have blood pressure checked with home health on tomorrow.  Otherwise will return in 1 week for blood pressure check. - amLODipine (NORVASC) 5 MG tablet; Take 1 tablet (5 mg total) by mouth daily.  Dispense: 90 tablet; Refill: 0  4. Diabetic neuropathy, painful (HCC) Restart gabapentin 300 mg twice daily. - gabapentin (NEURONTIN) 300 MG capsule; Take 1 capsule (300 mg total) by mouth 2 (two) times daily.  Dispense: 90 capsule; Refill: 1  Follow-up: Return in about 1 week (around 05/21/2020).  Will continue to evaluate patient often due to uncontrolled diabetes mellitus and episodes of hypoglycemia.   Donia Pounds  APRN, MSN, FNP-C Patient [facility] [address] [place], [place] [address] [phone]

## 2020-05-16 DIAGNOSIS — N1832 Chronic kidney disease, stage 3b: Secondary | ICD-10-CM | POA: Diagnosis not present

## 2020-05-16 DIAGNOSIS — E1165 Type 2 diabetes mellitus with hyperglycemia: Secondary | ICD-10-CM | POA: Diagnosis not present

## 2020-05-16 DIAGNOSIS — S2241XD Multiple fractures of ribs, right side, subsequent encounter for fracture with routine healing: Secondary | ICD-10-CM | POA: Diagnosis not present

## 2020-05-16 DIAGNOSIS — I129 Hypertensive chronic kidney disease with stage 1 through stage 4 chronic kidney disease, or unspecified chronic kidney disease: Secondary | ICD-10-CM | POA: Diagnosis not present

## 2020-05-16 DIAGNOSIS — D631 Anemia in chronic kidney disease: Secondary | ICD-10-CM | POA: Diagnosis not present

## 2020-05-16 DIAGNOSIS — E1110 Type 2 diabetes mellitus with ketoacidosis without coma: Secondary | ICD-10-CM | POA: Diagnosis not present

## 2020-05-16 DIAGNOSIS — J189 Pneumonia, unspecified organism: Secondary | ICD-10-CM | POA: Diagnosis not present

## 2020-05-16 DIAGNOSIS — N390 Urinary tract infection, site not specified: Secondary | ICD-10-CM | POA: Diagnosis not present

## 2020-05-16 DIAGNOSIS — E1122 Type 2 diabetes mellitus with diabetic chronic kidney disease: Secondary | ICD-10-CM | POA: Diagnosis not present

## 2020-05-21 ENCOUNTER — Telehealth (INDEPENDENT_AMBULATORY_CARE_PROVIDER_SITE_OTHER): Payer: Medicare HMO | Admitting: Family Medicine

## 2020-05-21 DIAGNOSIS — I10 Essential (primary) hypertension: Secondary | ICD-10-CM | POA: Diagnosis not present

## 2020-05-21 DIAGNOSIS — E1165 Type 2 diabetes mellitus with hyperglycemia: Secondary | ICD-10-CM

## 2020-05-21 NOTE — Progress Notes (Signed)
[name], Rachel Gonzalez are scheduled for a virtual visit with your provider today.    Just as we do with appointments in the office, we must obtain your consent to participate.  Your consent will be active for this visit and any virtual visit you may have with one of our providers in the next 365 days.    If you have a MyChart account, I can also send a copy of this consent to you electronically.  All virtual visits are billed to your insurance company just like a traditional visit in the office.  As this is a virtual visit, video technology does not allow for your provider to perform a traditional examination.  This may limit your provider's ability to fully assess your condition.  If your provider identifies any concerns that need to be evaluated in person or the need to arrange testing such as labs, EKG, etc, we will make arrangements to do so.    Although advances in technology are sophisticated, we cannot ensure that it will always work on either your end or our end.  If the connection with a video visit is poor, we may have to switch to a telephone visit.  With either a video or telephone visit, we are not always able to ensure that we have a secure connection.   I need to obtain your verbal consent now.   Are you willing to proceed with your visit today?   Rachel Gonzalez has provided verbal consent on 05/21/2020 for a virtual visit (video or telephone).     Virtual Visit via Video Note  I connected with Rachel Gonzalez on 05/21/20 at  2:20 PM EDT by a video enabled telemedicine application and verified that I am speaking with the correct person using two identifiers.  Location: Patient: Home Provider: [place] [facility]   I discussed the limitations of evaluation and management by telemedicine and the availability of in person appointments. The patient expressed understanding and agreed to proceed.  History of Present Illness:  Rachel Gonzalez is a 70 year old female with a medical  history significant for uncontrolled type 2 diabetes mellitus, essential hypertension, CKD stage III, and obesity presents via telephone for follow-up of diabetes. Patient was evaluated in clinic 1 week ago and had complaints of hypoglycemia upon awakening.  At that time, Lantus was decreased to 15 units at bedtime and 10 units prior to breakfast.  Patient says that symptoms have improved tremendously.  She denies any headache, blurred vision, diaphoresis, abdominal pain, polyuria, polydipsia, or polyphagia.  Patient has been checking blood glucose consistently.  She is following a carbohydrate modified diet divided over small meals throughout the day.  Patient has not been physically active due to generalized pain.  Diabetes She presents for her follow-up diabetic visit. She has type 2 diabetes mellitus. Her disease course has been fluctuating. Pertinent negatives for diabetes include no blurred vision, no chest pain, no fatigue, no foot paresthesias, no foot ulcerations, no polydipsia, no polyphagia, no polyuria, no weakness and no weight loss. There are no hypoglycemic complications. Symptoms are stable. Diabetic complications include peripheral neuropathy. Risk factors for coronary artery disease include diabetes mellitus, obesity, hypertension, sedentary lifestyle and post-menopausal. She has not had a previous visit with a dietitian. She rarely participates in exercise. Her breakfast blood glucose is taken between 9-10 am. Her breakfast blood glucose range is generally 110-130 mg/dl. An ACE inhibitor/angiotensin II receptor blocker is being taken. She sees a podiatrist.Eye exam is current.  Past Medical History:  Diagnosis Date  . Anemia   . Arthritis    right finger  . Chronic renal disease, stage 3, moderately decreased glomerular filtration rate (GFR) between 30-59 mL/min/1.73 square meter (HCC)   . Diabetes mellitus   . GERD (gastroesophageal reflux disease)   . Hypertension    Social  History   Socioeconomic History  . Marital status: Married    Spouse name: Not on file  . Number of children: Not on file  . Years of education: Not on file  . Highest education level: Not on file  Occupational History  . Not on file  Tobacco Use  . Smoking status: Never Smoker  . Smokeless tobacco: Never Used  Vaping Use  . Vaping Use: Never used  Substance and Sexual Activity  . Alcohol use: No  . Drug use: No  . Sexual activity: Not on file  Other Topics Concern  . Not on file  Social History Narrative   From [place].   Married.   Three children, 9 grandchildren.   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   Travels to [place], [facility]   Social Determinants of Health   Financial Resource Strain: Not on file  Food Insecurity: Not on file  Transportation Needs: Not on file  Physical Activity: Not on file  Stress: Not on file  Social Connections: Not on file  Intimate Partner Violence: Not on file   Immunization History  Administered Date(s) Administered  . PFIZER(Purple Top)SARS-COV-2 Vaccination 05/08/2019, 06/06/2019  . Pneumococcal Conjugate-13 05/23/2019  . Pneumococcal Polysaccharide-23 02/12/2015  . Td 05/07/2014  . Tdap 11/21/2007   Review of Systems  Constitutional: Negative for fatigue and weight loss.  HENT: Negative.   Eyes: Negative for blurred vision.  Respiratory: Negative.   Cardiovascular: Negative for chest pain.  Gastrointestinal: Negative.   Genitourinary: Negative.   Musculoskeletal: Positive for back pain and joint pain.  Skin: Negative.   Neurological: Negative.  Negative for weakness.  Endo/Heme/Allergies: Negative for polydipsia and polyphagia.  Psychiatric/Behavioral: Negative.       Assessment and Plan: 1. Uncontrolled type 2 diabetes mellitus with hyperglycemia (HCC) Hypoglycemia has improved.  Continue Lantus 15 units at bedtime and to 10 units in a.m. Continue carbohydrate  modified diet divided over small meals throughout the day.  At previous appointment, patient was given written information on symptoms of hypo and hyperglycemia.  2. Essential hypertension Patient advised to follow-up with CMA in 1 week for blood pressure check.  She has not been checking her blood pressure consistently at home.  Follow Up Instructions:    I discussed the assessment and treatment plan with the patient. The patient was provided an opportunity to ask questions and all were answered. The patient agreed with the plan and demonstrated an understanding of the instructions.   The patient was advised to call back or seek an in-person evaluation if the symptoms worsen or if the condition fails to improve as anticipated.  I provided 10 minutes of non-face-to-face time during this encounter.  Nolon Nations  APRN, MSN, FNP-C [facility] [address] [place], [name] [address] [phone]

## 2020-05-23 DIAGNOSIS — N390 Urinary tract infection, site not specified: Secondary | ICD-10-CM | POA: Diagnosis not present

## 2020-05-23 DIAGNOSIS — I129 Hypertensive chronic kidney disease with stage 1 through stage 4 chronic kidney disease, or unspecified chronic kidney disease: Secondary | ICD-10-CM | POA: Diagnosis not present

## 2020-05-23 DIAGNOSIS — E1165 Type 2 diabetes mellitus with hyperglycemia: Secondary | ICD-10-CM | POA: Diagnosis not present

## 2020-05-23 DIAGNOSIS — N1832 Chronic kidney disease, stage 3b: Secondary | ICD-10-CM | POA: Diagnosis not present

## 2020-05-23 DIAGNOSIS — J189 Pneumonia, unspecified organism: Secondary | ICD-10-CM | POA: Diagnosis not present

## 2020-05-23 DIAGNOSIS — D631 Anemia in chronic kidney disease: Secondary | ICD-10-CM | POA: Diagnosis not present

## 2020-05-23 DIAGNOSIS — E1110 Type 2 diabetes mellitus with ketoacidosis without coma: Secondary | ICD-10-CM | POA: Diagnosis not present

## 2020-05-23 DIAGNOSIS — S2241XD Multiple fractures of ribs, right side, subsequent encounter for fracture with routine healing: Secondary | ICD-10-CM | POA: Diagnosis not present

## 2020-05-23 DIAGNOSIS — E1122 Type 2 diabetes mellitus with diabetic chronic kidney disease: Secondary | ICD-10-CM | POA: Diagnosis not present

## 2020-05-28 ENCOUNTER — Ambulatory Visit: Payer: Medicare HMO | Admitting: Family Medicine

## 2020-05-28 DIAGNOSIS — D631 Anemia in chronic kidney disease: Secondary | ICD-10-CM | POA: Diagnosis not present

## 2020-05-28 DIAGNOSIS — I129 Hypertensive chronic kidney disease with stage 1 through stage 4 chronic kidney disease, or unspecified chronic kidney disease: Secondary | ICD-10-CM | POA: Diagnosis not present

## 2020-05-28 DIAGNOSIS — E1165 Type 2 diabetes mellitus with hyperglycemia: Secondary | ICD-10-CM | POA: Diagnosis not present

## 2020-05-28 DIAGNOSIS — E1110 Type 2 diabetes mellitus with ketoacidosis without coma: Secondary | ICD-10-CM | POA: Diagnosis not present

## 2020-05-28 DIAGNOSIS — J189 Pneumonia, unspecified organism: Secondary | ICD-10-CM | POA: Diagnosis not present

## 2020-05-28 DIAGNOSIS — N390 Urinary tract infection, site not specified: Secondary | ICD-10-CM | POA: Diagnosis not present

## 2020-05-28 DIAGNOSIS — N1832 Chronic kidney disease, stage 3b: Secondary | ICD-10-CM | POA: Diagnosis not present

## 2020-05-28 DIAGNOSIS — S2241XD Multiple fractures of ribs, right side, subsequent encounter for fracture with routine healing: Secondary | ICD-10-CM | POA: Diagnosis not present

## 2020-05-28 DIAGNOSIS — E1122 Type 2 diabetes mellitus with diabetic chronic kidney disease: Secondary | ICD-10-CM | POA: Diagnosis not present

## 2020-05-30 DIAGNOSIS — E1110 Type 2 diabetes mellitus with ketoacidosis without coma: Secondary | ICD-10-CM | POA: Diagnosis not present

## 2020-05-30 DIAGNOSIS — E1122 Type 2 diabetes mellitus with diabetic chronic kidney disease: Secondary | ICD-10-CM | POA: Diagnosis not present

## 2020-05-30 DIAGNOSIS — N1832 Chronic kidney disease, stage 3b: Secondary | ICD-10-CM | POA: Diagnosis not present

## 2020-05-30 DIAGNOSIS — D631 Anemia in chronic kidney disease: Secondary | ICD-10-CM | POA: Diagnosis not present

## 2020-05-30 DIAGNOSIS — S2241XD Multiple fractures of ribs, right side, subsequent encounter for fracture with routine healing: Secondary | ICD-10-CM | POA: Diagnosis not present

## 2020-05-30 DIAGNOSIS — N390 Urinary tract infection, site not specified: Secondary | ICD-10-CM | POA: Diagnosis not present

## 2020-05-30 DIAGNOSIS — J189 Pneumonia, unspecified organism: Secondary | ICD-10-CM | POA: Diagnosis not present

## 2020-05-30 DIAGNOSIS — E1165 Type 2 diabetes mellitus with hyperglycemia: Secondary | ICD-10-CM | POA: Diagnosis not present

## 2020-05-30 DIAGNOSIS — I129 Hypertensive chronic kidney disease with stage 1 through stage 4 chronic kidney disease, or unspecified chronic kidney disease: Secondary | ICD-10-CM | POA: Diagnosis not present

## 2020-05-31 ENCOUNTER — Other Ambulatory Visit: Payer: Self-pay

## 2020-05-31 NOTE — Patient Outreach (Signed)
[facility]) Care Management  05/31/2020  SINDA LEEDOM 1950-12-30 [id number]   Telephone call to patient for follow up. No answer.  HIPAA compliant voice message left.  Plan: If no return call will attempt again within 4 business days.   [name], RN, MSN [facility] Care Management Care Management Coordinator Direct Line [phone] Cell [phone] Toll Free: [phone]  Fax: [phone]

## 2020-06-05 ENCOUNTER — Other Ambulatory Visit: Payer: Self-pay

## 2020-06-05 DIAGNOSIS — I129 Hypertensive chronic kidney disease with stage 1 through stage 4 chronic kidney disease, or unspecified chronic kidney disease: Secondary | ICD-10-CM | POA: Diagnosis not present

## 2020-06-05 DIAGNOSIS — D631 Anemia in chronic kidney disease: Secondary | ICD-10-CM | POA: Diagnosis not present

## 2020-06-05 DIAGNOSIS — E1122 Type 2 diabetes mellitus with diabetic chronic kidney disease: Secondary | ICD-10-CM | POA: Diagnosis not present

## 2020-06-05 DIAGNOSIS — J189 Pneumonia, unspecified organism: Secondary | ICD-10-CM | POA: Diagnosis not present

## 2020-06-05 DIAGNOSIS — N1832 Chronic kidney disease, stage 3b: Secondary | ICD-10-CM | POA: Diagnosis not present

## 2020-06-05 DIAGNOSIS — N390 Urinary tract infection, site not specified: Secondary | ICD-10-CM | POA: Diagnosis not present

## 2020-06-05 DIAGNOSIS — S2241XD Multiple fractures of ribs, right side, subsequent encounter for fracture with routine healing: Secondary | ICD-10-CM | POA: Diagnosis not present

## 2020-06-05 DIAGNOSIS — E1110 Type 2 diabetes mellitus with ketoacidosis without coma: Secondary | ICD-10-CM | POA: Diagnosis not present

## 2020-06-05 DIAGNOSIS — E1165 Type 2 diabetes mellitus with hyperglycemia: Secondary | ICD-10-CM | POA: Diagnosis not present

## 2020-06-05 NOTE — Patient Outreach (Signed)
[facility]) Care Management  06/05/2020  TRINITEY ROACHE [birth year] [id number]   Telephone call to patient for follow up. No answer.  HIPAA compliant voice message left.   Plan: RN CM will attempt patient again in the month of April.   [name], RN, MSN [facility] Care Management Care Management Coordinator Direct Line [phone] Cell [phone] Toll Free: [phone]  Fax: [phone]

## 2020-06-10 ENCOUNTER — Other Ambulatory Visit: Payer: Self-pay

## 2020-06-10 NOTE — Patient Outreach (Signed)
[facility]) Care Management  [place]  06/10/2020   Rachel Gonzalez 06-Dec-1950 [id number]  Subjective: Telephone call to patient for follow up. Patient reports she is doing good and that diabetes management is better. She reports minimal lows.  Blood sugar ranges between 85-156.  Discussed continued diabetes management. She verbalized understanding.    Objective:   Encounter Medications:  Outpatient Encounter Medications as of 06/10/2020  Medication Sig  . Accu-Chek Softclix Lancets lancets Use 1-4 times daily as needed DX E11.9  . acetaminophen (TYLENOL) 500 MG tablet Take 500-1,000 mg by mouth every 6 (six) hours as needed (pain.).  Marland Kitchen amLODipine (NORVASC) 5 MG tablet Take 1 tablet (5 mg total) by mouth daily.  Marland Kitchen atorvastatin (LIPITOR) 20 MG tablet Take 1 tablet (20 mg total) by mouth daily.  . Blood Glucose Monitoring Suppl (ACCU-CHEK GUIDE) w/Device KIT 1 Device by Does not apply route 4 (four) times daily.  . cetirizine (ZYRTEC ALLERGY) 10 MG tablet Take 1 tablet (10 mg total) by mouth daily.  . furosemide (LASIX) 20 MG tablet Take 1 tablet (20 mg total) by mouth daily.  Marland Kitchen gabapentin (NEURONTIN) 300 MG capsule Take 1 capsule (300 mg total) by mouth 2 (two) times daily.  Marland Kitchen glucose blood (ACCU-CHEK GUIDE) test strip Use as instructed  . insulin glargine (LANTUS SOLOSTAR) 100 UNIT/ML Solostar Pen Take 15 units in the morning and 10 units at bedtime.  Marland Kitchen lisinopril (ZESTRIL) 10 MG tablet Take 1 tablet (10 mg total) by mouth daily.  Marland Kitchen omeprazole (PRILOSEC) 20 MG capsule Take 1 capsule (20 mg total) by mouth daily.  Marland Kitchen RELION PEN NEEDLE 31G/8MM 31G X 8 MM MISC USE AS DIRECTED 4 TIMES DAILY  . Triamcinolone Acetonide (TRIAMCINOLONE 0.1 % CREAM : EUCERIN) CREA Apply 1 application topically 3 (three) times daily as needed.   No facility-administered encounter medications on file as of 06/10/2020.    Functional Status:  In your present state of health, do you have any  difficulty performing the following activities: 04/24/2020 04/11/2020  Hearing? N N  Vision? N N  Comment has glaucoma -  Difficulty concentrating or making decisions? N Y  Walking or climbing stairs? Y N  Comment uses cane -  Dressing or bathing? N Y  Doing errands, shopping? Y N  Comment husband assist with transportation -  Conservation officer, nature and eating ? Y -  Comment husband helps -  Using the Toilet? N -  In the past six months, have you accidently leaked urine? N -  Do you have problems with loss of bowel control? N -  Managing your Medications? N -  Managing your Finances? N -  Housekeeping or managing your Housekeeping? Y -  Comment husband helps -  Some recent data might be hidden    Fall/Depression Screening: Fall Risk  05/07/2020 04/24/2020 08/29/2019  Falls in the past year? 1 1 0  Number falls in past yr: - 1 -  Injury with Fall? - 1 -  Risk for fall due to : - History of fall(s) -  Follow up Falls evaluation completed Education provided;Falls prevention discussed -   PHQ 2/9 Scores 05/07/2020 05/01/2020 08/29/2019 05/23/2019 08/31/2018 04/06/2018 02/16/2018  PHQ - 2 Score 0 0 0 0 0 0 0    Assessment:  Goals Addressed            This Visit's Progress   . Make and Keep All Appointments   On track    Timeframe:  Long-Range Goal Priority:  Medium Start Date:      04/25/20                       Expected End Date:  11/06/20                     Follow Up Date 08/06/20   - ask family or friend for a ride    Why is this important?    Part of staying healthy is seeing the doctor for follow-up care.   If you forget your appointments, there are some things you can do to stay on track.    Notes:  Assisted with scheduling post discharge appointment encouraged to write down on calendar. She has been sent [facility] calendar.  05/01/20-patient has follow up with PCP 05-07-20  05/14/20 PCP appointment today.     . COMPLETED: Manage My Medicine       Timeframe:  Long-Range Goal Priority:   High Start Date:   04/25/20                          Expected End Date: 4/31/22                      Follow Up Date 06/06/20 - call for medicine refill 2 or 3 days before it runs out - call if I am sick and can't take my medicine - keep a list of all the medicines I take; vitamins and herbals too    Why is this important?   . These steps will help you keep on track with your medicines.   Notes:  Reviewed MD updated medication list, with teachback, will send After visit summary  05/14/20 patient reports taking medications as prescribed.    . Monitor and Manage My Blood Sugar-Diabetes Type 2   On track    Timeframe:  Long-Range Goal Priority:  High Start Date:    04/25/20                         Expected End Date:    11/06/20                   Follow Up Date 08/06/20   - enter blood sugar readings and medication or insulin into daily log - take the blood sugar log to all doctor visits encouraged    Why is this important?    Checking your blood sugar at home helps to keep it from getting very high or very low.   Writing the results in a diary or log helps the doctor know how to care for you.   Your blood sugar log should have the time, date and the results.   Also, write down the amount of insulin or other medicine that you take.   Other information, like what you ate, exercise done and how you were feeling, will also be helpful.     Notes: Call to provider office regarding patient need for meter. Reviewed s&s of hypo/hyperglycemia and checking blood sugar. Reinforced checking blood sugar at least 3 times a day, before meals, bedtime. 05/01/20- patient checking sugars twice a day. Encouraged patient to recheck sugars a couple of hours after a low sugar.  05/14/20 patient checking sugars but still dealing with lows.  Lantus recently decreased.    06/10/20 Continue to monitor sugars and place in log for  MD review.  Keep up the great work!!       Plan: RN CM will contact patient next  month.   Follow-up:  Patient agrees to Care Plan and Follow-up.   [name], RN, MSN [place] Management Care Management Coordinator Direct Line [phone] Cell [phone] Toll Free: [phone]  Fax: [phone]

## 2020-06-10 NOTE — Patient Instructions (Signed)
Goals    . Make and Keep All Appointments     Timeframe:  Long-Range Goal Priority:  Medium Start Date:      04/25/20                       Expected End Date:  11/06/20                     Follow Up Date 08/06/20   - ask family or friend for a ride    Why is this important?    Part of staying healthy is seeing the doctor for follow-up care.   If you forget your appointments, there are some things you can do to stay on track.    Notes:  Assisted with scheduling post discharge appointment encouraged to write down on calendar. She has been sent [facility] calendar.  05/01/20-patient has follow up with PCP 05-07-20  05/14/20 PCP appointment today.     . Monitor and Manage My Blood Sugar-Diabetes Type 2     Timeframe:  Long-Range Goal Priority:  High Start Date:    04/25/20                         Expected End Date:    11/06/20                   Follow Up Date 08/06/20   - enter blood sugar readings and medication or insulin into daily log - take the blood sugar log to all doctor visits encouraged    Why is this important?    Checking your blood sugar at home helps to keep it from getting very high or very low.   Writing the results in a diary or log helps the doctor know how to care for you.   Your blood sugar log should have the time, date and the results.   Also, write down the amount of insulin or other medicine that you take.   Other information, like what you ate, exercise done and how you were feeling, will also be helpful.     Notes: Call to provider office regarding patient need for meter. Reviewed s&s of hypo/hyperglycemia and checking blood sugar. Reinforced checking blood sugar at least 3 times a day, before meals, bedtime. 05/01/20- patient checking sugars twice a day. Encouraged patient to recheck sugars a couple of hours after a low sugar.  05/14/20 patient checking sugars but still dealing with lows.  Lantus recently decreased.    06/10/20 Continue to monitor sugars and place in  log for MD review.  Keep up the great work!!

## 2020-06-13 ENCOUNTER — Other Ambulatory Visit: Payer: Self-pay | Admitting: Family Medicine

## 2020-06-13 DIAGNOSIS — I10 Essential (primary) hypertension: Secondary | ICD-10-CM

## 2020-06-13 DIAGNOSIS — E1165 Type 2 diabetes mellitus with hyperglycemia: Secondary | ICD-10-CM

## 2020-06-14 DIAGNOSIS — J189 Pneumonia, unspecified organism: Secondary | ICD-10-CM | POA: Diagnosis not present

## 2020-06-14 DIAGNOSIS — I129 Hypertensive chronic kidney disease with stage 1 through stage 4 chronic kidney disease, or unspecified chronic kidney disease: Secondary | ICD-10-CM | POA: Diagnosis not present

## 2020-06-14 DIAGNOSIS — E1165 Type 2 diabetes mellitus with hyperglycemia: Secondary | ICD-10-CM | POA: Diagnosis not present

## 2020-06-14 DIAGNOSIS — N390 Urinary tract infection, site not specified: Secondary | ICD-10-CM | POA: Diagnosis not present

## 2020-06-14 DIAGNOSIS — D631 Anemia in chronic kidney disease: Secondary | ICD-10-CM | POA: Diagnosis not present

## 2020-06-14 DIAGNOSIS — N1832 Chronic kidney disease, stage 3b: Secondary | ICD-10-CM | POA: Diagnosis not present

## 2020-06-14 DIAGNOSIS — E1122 Type 2 diabetes mellitus with diabetic chronic kidney disease: Secondary | ICD-10-CM | POA: Diagnosis not present

## 2020-06-14 DIAGNOSIS — S2241XD Multiple fractures of ribs, right side, subsequent encounter for fracture with routine healing: Secondary | ICD-10-CM | POA: Diagnosis not present

## 2020-06-14 DIAGNOSIS — E1110 Type 2 diabetes mellitus with ketoacidosis without coma: Secondary | ICD-10-CM | POA: Diagnosis not present

## 2020-06-18 ENCOUNTER — Ambulatory Visit (INDEPENDENT_AMBULATORY_CARE_PROVIDER_SITE_OTHER): Payer: Medicare HMO | Admitting: Family Medicine

## 2020-06-18 ENCOUNTER — Encounter: Payer: Self-pay | Admitting: Family Medicine

## 2020-06-18 ENCOUNTER — Other Ambulatory Visit: Payer: Self-pay

## 2020-06-18 VITALS — BP 198/84 | HR 85 | Ht 64.0 in | Wt 217.0 lb

## 2020-06-18 DIAGNOSIS — I10 Essential (primary) hypertension: Secondary | ICD-10-CM

## 2020-06-18 DIAGNOSIS — E1165 Type 2 diabetes mellitus with hyperglycemia: Secondary | ICD-10-CM | POA: Diagnosis not present

## 2020-06-18 DIAGNOSIS — E785 Hyperlipidemia, unspecified: Secondary | ICD-10-CM | POA: Diagnosis not present

## 2020-06-18 DIAGNOSIS — E669 Obesity, unspecified: Secondary | ICD-10-CM | POA: Diagnosis not present

## 2020-06-18 DIAGNOSIS — Z9109 Other allergy status, other than to drugs and biological substances: Secondary | ICD-10-CM

## 2020-06-18 DIAGNOSIS — N1831 Chronic kidney disease, stage 3a: Secondary | ICD-10-CM | POA: Diagnosis not present

## 2020-06-18 LAB — POCT URINALYSIS DIPSTICK
Bilirubin, UA: NEGATIVE
Glucose, UA: POSITIVE — AB
Ketones, UA: NEGATIVE
Nitrite, UA: NEGATIVE
Protein, UA: POSITIVE — AB
Spec Grav, UA: 1.02 (ref 1.010–1.025)
Urobilinogen, UA: 0.2 E.U./dL
pH, UA: 6.5 (ref 5.0–8.0)

## 2020-06-18 LAB — POCT GLYCOSYLATED HEMOGLOBIN (HGB A1C): Hemoglobin A1C: 11.5 % — AB (ref 4.0–5.6)

## 2020-06-18 LAB — GLUCOSE, POCT (MANUAL RESULT ENTRY): POC Glucose: 211 mg/dl — AB (ref 70–99)

## 2020-06-18 MED ORDER — LISINOPRIL 10 MG PO TABS: 1.0000 | ORAL_TABLET | Freq: Every day | ORAL | 1 refills | Status: DC

## 2020-06-18 MED ORDER — CLONIDINE HCL 0.1 MG PO TABS
0.2000 mg | ORAL_TABLET | Freq: Once | ORAL | Status: AC
  Administered 2020-06-18: 0.2 mg via ORAL

## 2020-06-18 MED ORDER — AMLODIPINE BESYLATE 5 MG PO TABS: 5.0000 mg | ORAL_TABLET | Freq: Every day | ORAL | 1 refills | Status: DC

## 2020-06-18 MED ORDER — ACCU-CHEK GUIDE VI STRP: ORAL_STRIP | 12 refills | Status: DC

## 2020-06-18 MED ORDER — TRIAMCINOLONE ACETONIDE 0.5 % EX OINT: 1.0000 "application " | TOPICAL_OINTMENT | Freq: Two times a day (BID) | CUTANEOUS | 1 refills | Status: DC

## 2020-06-18 MED ORDER — CETIRIZINE HCL 10 MG PO TABS: 10.0000 mg | ORAL_TABLET | Freq: Every day | ORAL | 11 refills | Status: DC

## 2020-06-18 NOTE — Progress Notes (Signed)
Patient [facility]   Established Patient Office Visit  Subjective:  Patient ID: Rachel Gonzalez, female    DOB: Nov 12, 1950  Age: 70 y.o. MRN: [id number]  CC:  Chief Complaint  Patient presents with  . Diabetes  . Hypertension    HPI   Rachel Gonzalez is a 70 year old female with a medical history significant for uncontrolled type 2 diabetes mellitus, chronic kidney disease, uncontrolled hypertension, hyperlipidemia, and obesity presents for follow-up of chronic conditions.  Patient says that she has not been taking all medications consistently.  She has had some insurance constraints that has presented her from getting some medications.  Patient does not have any antihypertensive medications and has been without them for around 5 days.  She currently denies any chest pain, lower extremity swelling, heart palpitations, or shortness of breath. Patient also has a history of uncontrolled type 2 diabetes mellitus.  Most recent hemoglobin A1c was 15.5 two months prior.  She has been following a low-fat, low carbohydrate diet divided over small meals throughout the day.  Patient has not been checking blood glucose consistently.  She says that she is out of testing strips.  Diabetes She presents for her follow-up diabetic visit. She has type 2 diabetes mellitus. Her disease course has been stable. Pertinent negatives for hypoglycemia include no confusion, dizziness or headaches. Pertinent negatives for diabetes include no blurred vision, no chest pain, no fatigue, no polydipsia, no polyphagia, no polyuria and no weakness. Symptoms are stable. Pertinent negatives for diabetic complications include no CVA. Risk factors for coronary artery disease include obesity and sedentary lifestyle. Current diabetic treatment includes insulin injections. She is compliant with treatment all of the time. She is following a generally healthy diet. She rarely participates in  exercise. An ACE inhibitor/angiotensin II receptor blocker is being taken. She sees a podiatrist.Eye exam is current.  Hypertension This is a chronic problem. The problem is uncontrolled. Pertinent negatives include no blurred vision, chest pain or headaches. Risk factors for coronary artery disease include obesity and sedentary lifestyle. Hypertensive end-organ damage includes kidney disease. There is no history of CAD/MI, CVA or heart failure.     Past Medical History:  Diagnosis Date  . Anemia   . Arthritis    right finger  . Chronic renal disease, stage 3, moderately decreased glomerular filtration rate (GFR) between 30-59 mL/min/1.73 square meter (HCC)   . Diabetes mellitus   . GERD (gastroesophageal reflux disease)   . Hypertension     Past Surgical History:  Procedure Laterality Date  . AMPUTATION Right 05/08/2014   Procedure: AMPUTATION RAY, right great toe;  Surgeon: Newt Minion, MD;  Location: [place];  Service: Orthopedics;  Laterality: Right;  . arm surgery     . CESAREAN SECTION    . COLONOSCOPY    . I & D EXTREMITY Right 05/08/2014   Procedure: IRRIGATION AND DEBRIDEMENT FOOT;  Surgeon: Newt Minion, MD;  Location: [place];  Service: Orthopedics;  Laterality: Right;  . MEMBRANE PEEL Right 03/15/2018   Procedure: MEMBRANE PEEL;  Surgeon: Jalene Mullet, MD;  Location: [place];  Service: Ophthalmology;  Laterality: Right;  . PHOTOCOAGULATION WITH LASER Right 03/15/2018   Procedure: PHOTOCOAGULATION WITH LASER;  Surgeon: Jalene Mullet, MD;  Location: [place];  Service: Ophthalmology;  Laterality: Right;  . 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 Diabetes Mother     Social History   Socioeconomic  History  . Marital status: Married    Spouse name: Not on file  . Number of children: Not on file  . Years of education: Not on file  . Highest education level: Not on file  Occupational History  . Not on file  Tobacco Use  . Smoking status: Never Smoker  . Smokeless tobacco: Never Used  Vaping Use  . Vaping Use: Never used  Substance and Sexual Activity  . Alcohol use: No  . Drug use: No  . Sexual activity: Not on file  Other Topics Concern  . Not on file  Social History Narrative   From [place].   Married.   Three children, 9 grandchildren.   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   Travels to [place], [facility]   Social Determinants of Health   Financial Resource Strain: Not on file  Food Insecurity: Not on file  Transportation Needs: Not on file  Physical Activity: Not on file  Stress: Not on file  Social Connections: Not on file  Intimate Partner Violence: Not on file    Outpatient Medications Prior to Visit  Medication Sig Dispense Refill  . Accu-Chek Softclix Lancets lancets Use 1-4 times daily as needed DX E11.9 360 each 3  . acetaminophen (TYLENOL) 500 MG tablet Take 500-1,000 mg by mouth every 6 (six) hours as needed (pain.).    Marland Kitchen amLODipine (NORVASC) 5 MG tablet Take 1 tablet (5 mg total) by mouth daily. 90 tablet 0  . atorvastatin (LIPITOR) 20 MG tablet Take 1 tablet (20 mg total) by mouth daily. 90 tablet 3  . Blood Glucose Monitoring Suppl (ACCU-CHEK GUIDE) w/Device KIT 1 Device by Does not apply route 4 (four) times daily. 1 kit 0  . cetirizine (ZYRTEC ALLERGY) 10 MG tablet Take 1 tablet (10 mg total) by mouth daily. 30 tablet 0  . furosemide (LASIX) 20 MG tablet Take 1 tablet (20 mg total) by mouth daily. 30 tablet 11  . gabapentin (NEURONTIN) 300 MG capsule Take 1 capsule (300 mg total) by mouth 2 (two) times daily. 90 capsule 1  . glucose blood (ACCU-CHEK GUIDE) test strip Use as instructed 100 each 12   . insulin glargine (LANTUS SOLOSTAR) 100 UNIT/ML Solostar Pen Take 15 units in the morning and 10 units at bedtime. 15 mL PRN  . lisinopril (ZESTRIL) 10 MG tablet Take 1 tablet by mouth once daily 30 tablet 0  . omeprazole (PRILOSEC) 20 MG capsule Take 1 capsule (20 mg total) by mouth daily. 90 capsule 2  . RELION PEN NEEDLE 31G/8MM 31G X 8 MM MISC USE AS DIRECTED 4 TIMES DAILY 100 each 11  . Triamcinolone Acetonide (TRIAMCINOLONE 0.1 % CREAM : EUCERIN) CREA Apply 1 application topically 3 (three) times daily as needed. 1 each 5   No facility-administered medications prior to visit.    Allergies  Allergen Reactions  . Tramadol Nausea And Vomiting  . Adhesive [Tape] Itching  . Bactrim [Sulfamethoxazole-Trimethoprim] Other (See Comments)    AKI and hyperkalemia   . Tylenol With Codeine #3 [Acetaminophen-Codeine] Diarrhea and Itching    Only allergic to Codeine, patient reports no allergy to Tylenol.    ROS Review of Systems  Constitutional: Negative for fatigue.  Eyes: Negative for blurred  vision.  Cardiovascular: Negative for chest pain.  Gastrointestinal: Negative.   Endocrine: Negative for polydipsia, polyphagia and polyuria.  Genitourinary: Negative.   Musculoskeletal: Negative.   Skin: Negative.   Neurological: Negative for dizziness, weakness and headaches.  Psychiatric/Behavioral: Negative.  Negative for confusion.      Objective:    Physical Exam Constitutional:      Appearance: Normal appearance. She is obese.  Cardiovascular:     Rate and Rhythm: Normal rate.  Pulmonary:     Effort: Pulmonary effort is normal.  Abdominal:     General: Bowel sounds are normal.  Neurological:     General: No focal deficit present.     Mental Status: She is alert. Mental status is at baseline.  Psychiatric:        Mood and Affect: Mood normal.        Thought Content: Thought content normal.        Judgment: Judgment normal.     Pulse 85   Ht 5' 4"  (1.626 m)   Wt 217 lb  (98.4 kg)   SpO2 97%   BMI 37.25 kg/m  Wt Readings from Last 3 Encounters:  06/18/20 217 lb (98.4 kg)  05/14/20 222 lb (100.7 kg)  05/07/20 224 lb (101.6 kg)     Health Maintenance Due  Topic Date Due  . COLONOSCOPY (Pts 45-56yr Insurance coverage will need to be confirmed)  Never done  . DEXA SCAN  Never done  . MAMMOGRAM  04/22/2017  . COVID-19 Vaccine (3 - Booster for Pfizer series) 12/07/2019  . OPHTHALMOLOGY EXAM  01/06/2020  . PNA vac Low Risk Adult (2 of 2 - PPSV23) 05/22/2020    There are no preventive care reminders to display for this patient.  Lab Results  Component Value Date   TSH 0.838 04/10/2020   Lab Results  Component Value Date   WBC 6.0 04/15/2020   HGB 11.3 (L) 04/15/2020   HCT 36.6 04/15/2020   MCV 86.1 04/15/2020   PLT 172 04/15/2020   Lab Results  Component Value Date   NA 140 04/16/2020   K 4.4 04/16/2020   CO2 24 04/16/2020   GLUCOSE 75 04/16/2020   BUN 26 (H) 04/16/2020   CREATININE 1.58 (H) 04/16/2020   BILITOT 0.5 04/12/2020   ALKPHOS 142 (H) 04/12/2020   AST 14 (L) 04/12/2020   ALT 10 04/12/2020   PROT 5.6 (L) 04/12/2020   ALBUMIN 2.6 (L) 04/12/2020   CALCIUM 9.4 04/16/2020   ANIONGAP 10 04/16/2020   GFR 63.19 11/07/2014   Lab Results  Component Value Date   CHOL 156 06/26/2019   Lab Results  Component Value Date   HDL 78 06/26/2019   Lab Results  Component Value Date   LDLCALC 68 06/26/2019   Lab Results  Component Value Date   TRIG 43 06/26/2019   Lab Results  Component Value Date   CHOLHDL 2.0 06/26/2019   Lab Results  Component Value Date   HGBA1C >15.5 (H) 04/11/2020      Assessment & Plan:   Problem List Items Addressed This Visit      Cardiovascular and Mediastinum   Essential hypertension   Relevant Orders   Glucose (CBG)   Basic Metabolic Panel   Urinalysis Dipstick     Endocrine   Uncontrolled type 2 diabetes mellitus with hyperglycemia (HCC) - Primary   Relevant Orders   Glucose (CBG)    HgB AT0W  Basic Metabolic Panel   Urinalysis Dipstick  Genitourinary   Stage 3 chronic kidney disease (HCC)   Relevant Orders   Basic Metabolic Panel     Other   Obesity (BMI 30-39.9) (Chronic)    Other Visit Diagnoses    Hyperlipidemia, unspecified hyperlipidemia type         1. Uncontrolled type 2 diabetes mellitus with hyperglycemia (HCC) Hemoglobin a1C has improved to 11.5 from 15.5 3 months prior. Recommend that patient continues a low fat, low carb diet divided over small meals throughout the day.  - Glucose (CBG) - HgB Y5R - Basic Metabolic Panel - Urinalysis Dipstick  2. Hyperlipidemia, unspecified hyperlipidemia type The 10-year ASCVD risk score Mikey Bussing DC Brooke Bonito., et al., 2013) is: 42.1%   Values used to calculate the score:     Age: 35 years     Sex: Female     Is Non-Hispanic African American: Yes     Diabetic: Yes     Tobacco smoker: No     Systolic Blood Pressure: 493 mmHg     Is BP treated: Yes     HDL Cholesterol: 78 mg/dL     Total Cholesterol: 156 mg/dL   3. Essential hypertension BP (!) 198/84   Pulse 85   Ht 5' 4"  (1.626 m)   Wt 217 lb (98.4 kg)   SpO2 97%   BMI 37.25 kg/m  - Glucose (CBG) - Basic Metabolic Panel - Urinalysis Dipstick  4. Stage 3a chronic kidney disease (HCC) - Basic Metabolic Panel  5. Obesity (BMI 30-39.9) The patient is asked to make an attempt to improve diet and exercise patterns to aid in medical management of this problem.  Follow-up: Return in about 3 months (around 09/17/2020) for diabetes, hypertension.    Donia Pounds  APRN, MSN, FNP-C Patient [place] [address] [place], [place] [address] [phone]

## 2020-06-19 ENCOUNTER — Telehealth: Payer: Self-pay | Admitting: Family Medicine

## 2020-06-19 LAB — BASIC METABOLIC PANEL
BUN/Creatinine Ratio: 14 (ref 12–28)
BUN: 25 mg/dL (ref 8–27)
CO2: 24 mmol/L (ref 20–29)
Calcium: 9.4 mg/dL (ref 8.7–10.3)
Chloride: 106 mmol/L (ref 96–106)
Creatinine, Ser: 1.77 mg/dL — ABNORMAL HIGH (ref 0.57–1.00)
Glucose: 198 mg/dL — ABNORMAL HIGH (ref 65–99)
Potassium: 5.3 mmol/L — ABNORMAL HIGH (ref 3.5–5.2)
Sodium: 145 mmol/L — ABNORMAL HIGH (ref 134–144)
eGFR: 31 mL/min/{1.73_m2} — ABNORMAL LOW (ref >59)

## 2020-06-19 NOTE — Telephone Encounter (Signed)
Rachel Gonzalez is a 70 year old female with a medical history significant for uncontrolled diabetes mellitus type 2, hypertension, hyperlipidemia, CKD stage III, and history of obesity was evaluated in clinic on 06/18/2020. At that time, laboratory values were obtained.  Reviewed.  Creatinine, indicator of kidney functioning, continues to be elevated.  We will continue to monitor closely.  Value is stable and consistent with her baseline.  Recommend continued tight control of both type 2 diabetes mellitus and hypertension.  Patient's hemoglobin A1c improved over the past 3 months.  3 months prior hemoglobin A1c was 15.5, which decreased to 11.5.  Will continue medication regimen as previously prescribed, no changes warranted at this time. Continue low-fat, low carbohydrate diet divided over small meals throughout the day.  Also, recommend increasing daily physical activity.  Patient will follow-up in office in 3 months as scheduled.  Nolon Nations  APRN, MSN, FNP-C Patient Care [facility] [address] [place], [name] [address] [phone]

## 2020-06-20 DIAGNOSIS — N1832 Chronic kidney disease, stage 3b: Secondary | ICD-10-CM | POA: Diagnosis not present

## 2020-06-20 DIAGNOSIS — S2241XD Multiple fractures of ribs, right side, subsequent encounter for fracture with routine healing: Secondary | ICD-10-CM | POA: Diagnosis not present

## 2020-06-20 DIAGNOSIS — D631 Anemia in chronic kidney disease: Secondary | ICD-10-CM | POA: Diagnosis not present

## 2020-06-20 DIAGNOSIS — E1165 Type 2 diabetes mellitus with hyperglycemia: Secondary | ICD-10-CM | POA: Diagnosis not present

## 2020-06-20 DIAGNOSIS — I129 Hypertensive chronic kidney disease with stage 1 through stage 4 chronic kidney disease, or unspecified chronic kidney disease: Secondary | ICD-10-CM | POA: Diagnosis not present

## 2020-06-20 DIAGNOSIS — E1110 Type 2 diabetes mellitus with ketoacidosis without coma: Secondary | ICD-10-CM | POA: Diagnosis not present

## 2020-06-20 DIAGNOSIS — E1122 Type 2 diabetes mellitus with diabetic chronic kidney disease: Secondary | ICD-10-CM | POA: Diagnosis not present

## 2020-06-20 DIAGNOSIS — J189 Pneumonia, unspecified organism: Secondary | ICD-10-CM | POA: Diagnosis not present

## 2020-06-20 DIAGNOSIS — N390 Urinary tract infection, site not specified: Secondary | ICD-10-CM | POA: Diagnosis not present

## 2020-06-20 NOTE — Telephone Encounter (Signed)
Informed patient of results and recommendations. 

## 2020-07-02 ENCOUNTER — Ambulatory Visit: Payer: Medicare HMO | Admitting: Family Medicine

## 2020-07-11 ENCOUNTER — Other Ambulatory Visit: Payer: Self-pay

## 2020-07-11 NOTE — Patient Instructions (Signed)
Goals Addressed            This Visit's Progress   . Make and Keep All Appointments   On track    Timeframe:  Long-Range Goal Priority:  Medium Start Date:      04/25/20                       Expected End Date:  11/06/20                     Follow Up Date 09/05/20   - call to cancel if needed    Why is this important?    Part of staying healthy is seeing the doctor for follow-up care.   If you forget your appointments, there are some things you can do to stay on track.    Notes:  Assisted with scheduling post discharge appointment encouraged to write down on calendar. She has been sent [facility] calendar.  05/01/20-patient has follow up with PCP 05-07-20  05/14/20 PCP appointment today.  07/11/20 Patient sees MD as scheduled    . Monitor and Manage My Blood Sugar-Diabetes Type 2   On track    Timeframe:  Long-Range Goal Priority:  High Start Date:    04/25/20                         Expected End Date:    11/06/20                   Follow Up Date 09/05/20   - check blood sugar if I feel it is too high or too low - enter blood sugar readings and medication or insulin into daily log - take the blood sugar log to all doctor visits encouraged    Why is this important?    Checking your blood sugar at home helps to keep it from getting very high or very low.   Writing the results in a diary or log helps the doctor know how to care for you.   Your blood sugar log should have the time, date and the results.   Also, write down the amount of insulin or other medicine that you take.   Other information, like what you ate, exercise done and how you were feeling, will also be helpful.     Notes: Call to provider office regarding patient need for meter. Reviewed s&s of hypo/hyperglycemia and checking blood sugar. Reinforced checking blood sugar at least 3 times a day, before meals, bedtime. 05/01/20- patient checking sugars twice a day. Encouraged patient to recheck sugars a couple of hours after a  low sugar.  05/14/20 patient checking sugars but still dealing with lows.  Lantus recently decreased.    06/10/20 Continue to monitor sugars and place in log for MD review.  Keep up the great work!! 07/11/20 A1c 11.5  Keep up the great work!!

## 2020-07-11 NOTE — Patient Outreach (Signed)
[facility]) Care Management  [place]  07/11/2020   Rachel Gonzalez 1950/09/04 [id number]  Subjective: Telephone call to patient for follow up. Patient doing ok.  Blood sugar this am was 79.  Discussed diabetes management and congratulated on lower A1c.  Patient denies any questions or concerns.    Objective:   Encounter Medications:  Outpatient Encounter Medications as of 07/11/2020  Medication Sig  . Accu-Chek Softclix Lancets lancets Use 1-4 times daily as needed DX E11.9  . acetaminophen (TYLENOL) 500 MG tablet Take 500-1,000 mg by mouth every 6 (six) hours as needed (pain.).  Marland Kitchen amLODipine (NORVASC) 5 MG tablet Take 1 tablet (5 mg total) by mouth daily.  Marland Kitchen atorvastatin (LIPITOR) 20 MG tablet Take 1 tablet (20 mg total) by mouth daily.  . Blood Glucose Monitoring Suppl (ACCU-CHEK GUIDE) w/Device KIT 1 Device by Does not apply route 4 (four) times daily.  . cetirizine (ZYRTEC ALLERGY) 10 MG tablet Take 1 tablet (10 mg total) by mouth daily.  . furosemide (LASIX) 20 MG tablet Take 1 tablet (20 mg total) by mouth daily.  Marland Kitchen gabapentin (NEURONTIN) 300 MG capsule Take 1 capsule (300 mg total) by mouth 2 (two) times daily.  Marland Kitchen glucose blood (ACCU-CHEK GUIDE) test strip Use as instructed  . insulin glargine (LANTUS SOLOSTAR) 100 UNIT/ML Solostar Pen Take 15 units in the morning and 10 units at bedtime.  Marland Kitchen lisinopril (ZESTRIL) 10 MG tablet Take 1 tablet (10 mg total) by mouth daily.  Marland Kitchen omeprazole (PRILOSEC) 20 MG capsule Take 1 capsule (20 mg total) by mouth daily.  Marland Kitchen RELION PEN NEEDLE 31G/8MM 31G X 8 MM MISC USE AS DIRECTED 4 TIMES DAILY  . Triamcinolone Acetonide (TRIAMCINOLONE 0.1 % CREAM : EUCERIN) CREA Apply 1 application topically 3 (three) times daily as needed.  . triamcinolone ointment (KENALOG) 0.5 % Apply 1 application topically 2 (two) times daily.   No facility-administered encounter medications on file as of 07/11/2020.    Functional Status:  In your  present state of health, do you have any difficulty performing the following activities: 07/11/2020 04/24/2020  Hearing? N N  Vision? N N  Comment has glaucoma has glaucoma  Difficulty concentrating or making decisions? N N  Walking or climbing stairs? Rachel Gonzalez  Comment uses cane uses cane  Dressing or bathing? N N  Doing errands, shopping? Rachel Gonzalez  Comment husband assist with transportation husband assist with Copywriter, advertising and eating ? Y Y  Comment husband helps husband helps  Using the Toilet? N N  In the past six months, have you accidently leaked urine? N N  Do you have problems with loss of bowel control? N N  Managing your Medications? N N  Managing your Finances? N N  Housekeeping or managing your Housekeeping? Y Y  Comment husband helps husband helps  Some recent data might be hidden    Fall/Depression Screening: Fall Risk  07/11/2020 05/07/2020 04/24/2020  Falls in the past year? _0 Number falls in past yr: 1 - 1  Injury with Fall? 1 - 1  Risk for fall due to : History of fall(s) - History of fall(s)  Follow up Falls prevention discussed;Education provided Falls evaluation completed Education provided;Falls prevention discussed   PHQ 2/9 Scores 07/11/2020 05/07/2020 05/01/2020 08/29/2019 05/23/2019 08/31/2018 04/06/2018  PHQ - 2 Score 0 0 0 0 0 0 0    Assessment:  Goals Addressed  This Visit's Progress   . Make and Keep All Appointments   On track    Timeframe:  Long-Range Goal Priority:  Medium Start Date:      04/25/20                       Expected End Date:  11/06/20                     Follow Up Date 09/05/20   - call to cancel if needed    Why is this important?    Part of staying healthy is seeing the doctor for follow-up care.   If you forget your appointments, there are some things you can do to stay on track.    Notes:  Assisted with scheduling post discharge appointment encouraged to write down on calendar. She has been sent [facility]  calendar.  05/01/20-patient has follow up with PCP 05-07-20  05/14/20 PCP appointment today.  07/11/20 Patient sees MD as scheduled    . Monitor and Manage My Blood Sugar-Diabetes Type 2   On track    Timeframe:  Long-Range Goal Priority:  High Start Date:    04/25/20                         Expected End Date:    11/06/20                   Follow Up Date 09/05/20   - check blood sugar if I feel it is too high or too low - enter blood sugar readings and medication or insulin into daily log - take the blood sugar log to all doctor visits encouraged    Why is this important?    Checking your blood sugar at home helps to keep it from getting very high or very low.   Writing the results in a diary or log helps the doctor know how to care for you.   Your blood sugar log should have the time, date and the results.   Also, write down the amount of insulin or other medicine that you take.   Other information, like what you ate, exercise done and how you were feeling, will also be helpful.     Notes: Call to provider office regarding patient need for meter. Reviewed s&s of hypo/hyperglycemia and checking blood sugar. Reinforced checking blood sugar at least 3 times a day, before meals, bedtime. 05/01/20- patient checking sugars twice a day. Encouraged patient to recheck sugars a couple of hours after a low sugar.  05/14/20 patient checking sugars but still dealing with lows.  Lantus recently decreased.    06/10/20 Continue to monitor sugars and place in log for MD review.  Keep up the great work!! 07/11/20 A1c 11.5  Keep up the great work!!       Plan: RN CM will follow up in June.   Follow-up:  Patient agrees to Care Plan and Follow-up.   Rachel Baseman, RN, MSN [place] Management Care Management Coordinator Direct Line [phone] Cell [phone] Toll Free: [phone]  Fax: [phone]

## 2020-07-23 ENCOUNTER — Telehealth: Payer: Self-pay

## 2020-07-23 ENCOUNTER — Other Ambulatory Visit: Payer: Self-pay

## 2020-07-23 DIAGNOSIS — E1165 Type 2 diabetes mellitus with hyperglycemia: Secondary | ICD-10-CM

## 2020-07-23 MED ORDER — LANTUS SOLOSTAR 100 UNIT/ML ~~LOC~~ SOPN: PEN_INJECTOR | SUBCUTANEOUS | 5 refills | Status: DC

## 2020-07-23 NOTE — Telephone Encounter (Signed)
Called and spoke with patient to confirm what medication she need refill on insulin , sent refill to [facility]. I told patient if she couldn't afford medication please  call us back.

## 2020-07-23 NOTE — Telephone Encounter (Signed)
Pt states that she is out of her insulin And needs pain pills for her foot

## 2020-07-29 ENCOUNTER — Telehealth: Payer: Self-pay

## 2020-07-29 NOTE — Telephone Encounter (Signed)
Questions about her medicine and needs to know about approval for insurance.  Wouldn't be specific

## 2020-08-06 ENCOUNTER — Telehealth: Payer: Self-pay

## 2020-08-06 NOTE — Telephone Encounter (Signed)
Med to be faxed   basiclar insulin Fax [phone]

## 2020-08-08 ENCOUNTER — Other Ambulatory Visit: Payer: Self-pay

## 2020-08-08 NOTE — Patient Outreach (Signed)
[facility]) Care Management  [place]  08/08/2020   Rachel Gonzalez 1951-01-23 [id number]  Subjective: Telephone call to patient for disease management follow up.  Patient reports that she is doing ok. She reports needing some refills on her lasix, prilosec and gabapentin.  Patient states she has called her pharmacy.  Advised patient that CM would also message PCP.  She verbalized understanding.  Discussed diabetes management with patient as well and encouraged patient to continue her great job with managing diabetes. No concerns.   Objective:   Encounter Medications:  Outpatient Encounter Medications as of 08/08/2020  Medication Sig  . Accu-Chek Softclix Lancets lancets Use 1-4 times daily as needed DX E11.9  . acetaminophen (TYLENOL) 500 MG tablet Take 500-1,000 mg by mouth every 6 (six) hours as needed (pain.).  Marland Kitchen amLODipine (NORVASC) 5 MG tablet Take 1 tablet (5 mg total) by mouth daily.  Marland Kitchen atorvastatin (LIPITOR) 20 MG tablet Take 1 tablet (20 mg total) by mouth daily.  . Blood Glucose Monitoring Suppl (ACCU-CHEK GUIDE) w/Device KIT 1 Device by Does not apply route 4 (four) times daily.  . cetirizine (ZYRTEC ALLERGY) 10 MG tablet Take 1 tablet (10 mg total) by mouth daily.  . furosemide (LASIX) 20 MG tablet Take 1 tablet (20 mg total) by mouth daily.  Marland Kitchen gabapentin (NEURONTIN) 300 MG capsule Take 1 capsule (300 mg total) by mouth 2 (two) times daily.  Marland Kitchen glucose blood (ACCU-CHEK GUIDE) test strip Use as instructed  . insulin glargine (LANTUS SOLOSTAR) 100 UNIT/ML Solostar Pen Take 15 units in the morning and 10 units at bedtime.  Marland Kitchen lisinopril (ZESTRIL) 10 MG tablet Take 1 tablet (10 mg total) by mouth daily.  Marland Kitchen omeprazole (PRILOSEC) 20 MG capsule Take 1 capsule (20 mg total) by mouth daily.  Marland Kitchen RELION PEN NEEDLE 31G/8MM 31G X 8 MM MISC USE AS DIRECTED 4 TIMES DAILY  . Triamcinolone Acetonide (TRIAMCINOLONE 0.1 % CREAM : EUCERIN) CREA Apply 1 application topically  3 (three) times daily as needed.  . triamcinolone ointment (KENALOG) 0.5 % Apply 1 application topically 2 (two) times daily.   No facility-administered encounter medications on file as of 08/08/2020.    Functional Status:  In your present state of health, do you have any difficulty performing the following activities: 07/11/2020 04/24/2020  Hearing? N N  Vision? N N  Comment has glaucoma has glaucoma  Difficulty concentrating or making decisions? N N  Walking or climbing stairs? [name]  Comment uses cane uses cane  Dressing or bathing? N N  Doing errands, shopping? [name]  Comment husband assist with transportation husband assist with Copywriter, advertising and eating ? Y Y  Comment husband helps husband helps  Using the Toilet? N N  In the past six months, have you accidently leaked urine? N N  Do you have problems with loss of bowel control? N N  Managing your Medications? N N  Managing your Finances? N N  Housekeeping or managing your Housekeeping? Y Y  Comment husband helps husband helps  Some recent data might be hidden    Fall/Depression Screening: Fall Risk  07/11/2020 05/07/2020 04/24/2020  Falls in the past year? 1 1 1   Number falls in past yr: 1 - 1  Injury with Fall? 1 - 1  Risk for fall due to : History of fall(s) - History of fall(s)  Follow up Falls prevention discussed;Education provided Falls evaluation completed Education provided;Falls prevention discussed   PHQ  2/9 Scores 07/11/2020 05/07/2020 05/01/2020 08/29/2019 05/23/2019 08/31/2018 04/06/2018  PHQ - 2 Score 0 0 0 0 0 0 0    Assessment:  Goals Addressed            This Visit's Progress   . COMPLETED: Make and Keep All Appointments   On track    Timeframe:  Long-Range Goal Priority:  Medium Start Date:      04/25/20                       Expected End Date:  11/06/20                     Follow Up Date 09/05/20   - call to cancel if needed    Why is this important?    Part of staying healthy is seeing the  doctor for follow-up care.   If you forget your appointments, there are some things you can do to stay on track.    Notes:  Assisted with scheduling post discharge appointment encouraged to write down on calendar. She has been sent [facility] calendar.  05/01/20-patient has follow up with PCP 05-07-20  05/14/20 PCP appointment today.  07/11/20 Patient sees MD as scheduled    . Monitor and Manage My Blood Sugar-Diabetes Type 2   On track    Timeframe:  Long-Range Goal Priority:  High Start Date:    04/25/20                         Expected End Date:    03/08/21                   Follow Up Date 10/06/20   - check blood sugar at prescribed times - take the blood sugar log to all doctor visits encouraged    Why is this important?    Checking your blood sugar at home helps to keep it from getting very high or very low.   Writing the results in a diary or log helps the doctor know how to care for you.   Your blood sugar log should have the time, date and the results.   Also, write down the amount of insulin or other medicine that you take.   Other information, like what you ate, exercise done and how you were feeling, will also be helpful.     Notes: Call to provider office regarding patient need for meter. Reviewed s&s of hypo/hyperglycemia and checking blood sugar. Reinforced checking blood sugar at least 3 times a day, before meals, bedtime. 05/01/20- patient checking sugars twice a day. Encouraged patient to recheck sugars a couple of hours after a low sugar.  05/14/20 patient checking sugars but still dealing with lows.  Lantus recently decreased.    06/10/20 Continue to monitor sugars and place in log for MD review.  Keep up the great work!! 07/11/20 A1c 11.5  Keep up the great work!! 08/08/20 Patient continues to manage diabetes.  Reviewed importance of limiting carbs such as rice, potatoes, breads, and pastas. Also discussed limiting sweets and sugary drinks.  Also discussed importance of annual exams  such as mammogram and eye exam.          Plan: RN CM will follow up with patient in the next 4-6 weeks. Follow-up:  Patient agrees to Care Plan and Follow-up.   [name], RN, MSN [place] Management Care Management Coordinator Direct Line [phone]  Cell [phone] Toll Free: [phone]  Fax: [phone]

## 2020-08-12 ENCOUNTER — Other Ambulatory Visit: Payer: Self-pay | Admitting: Family Medicine

## 2020-08-13 ENCOUNTER — Other Ambulatory Visit: Payer: Self-pay

## 2020-08-13 ENCOUNTER — Other Ambulatory Visit: Payer: Self-pay | Admitting: Family Medicine

## 2020-08-13 DIAGNOSIS — R600 Localized edema: Secondary | ICD-10-CM

## 2020-08-13 DIAGNOSIS — K219 Gastro-esophageal reflux disease without esophagitis: Secondary | ICD-10-CM

## 2020-08-13 DIAGNOSIS — E1165 Type 2 diabetes mellitus with hyperglycemia: Secondary | ICD-10-CM

## 2020-08-13 DIAGNOSIS — I10 Essential (primary) hypertension: Secondary | ICD-10-CM

## 2020-08-13 MED ORDER — LISINOPRIL 10 MG PO TABS: 1.0000 | ORAL_TABLET | Freq: Every day | ORAL | 3 refills | Status: DC

## 2020-08-13 MED ORDER — FUROSEMIDE 20 MG PO TABS: 20.0000 mg | ORAL_TABLET | Freq: Every day | ORAL | 3 refills | Status: DC

## 2020-08-13 MED ORDER — OMEPRAZOLE 20 MG PO CPDR: 20.0000 mg | DELAYED_RELEASE_CAPSULE | Freq: Every day | ORAL | 3 refills | Status: DC

## 2020-08-13 NOTE — Patient Outreach (Signed)
[facility]) Care Management  08/13/2020  SIMRAN BOMKAMP March 25, 1950 [id number]   Telephone call to patient for follow up ion medications. She states she has 2 prescriptions ready at [facility].  She will go by there today to pick up. Discussed her insulin, she states she has been approved through [employer] but prescription for lantus replacement needs to be sent.  Advised her that her PCP has been out of the office and working on things. She verbalized understanding.    She states her sugars are doing good with this morning report of 89.  She states she does not want to run out of medication since she is doing do well with her sugar control. She has follow up appointments scheduled advised patient to take her medications with her so that the doctor can see all the medications she is taking. She verbalized understanding.    Plan: RN CM will follow with patient as previously scheduled.   [name], RN, MSN [facility] Care Management Care Management Coordinator Direct Line [phone] Cell [phone] Toll Free: [phone]  Fax: [phone]

## 2020-08-13 NOTE — Progress Notes (Signed)
Meds ordered this encounter  Medications  . omeprazole (PRILOSEC) 20 MG capsule    Sig: Take 1 capsule (20 mg total) by mouth daily.    Dispense:  90 capsule    Refill:  3    Order Specific Question:   Supervising Provider    Answer:   Quentin Angst [id number]  . furosemide (LASIX) 20 MG tablet    Sig: Take 1 tablet (20 mg total) by mouth daily.    Dispense:  90 tablet    Refill:  3    Order Specific Question:   Supervising Provider    Answer:   Quentin Angst [id number]  . lisinopril (ZESTRIL) 10 MG tablet    Sig: Take 1 tablet (10 mg total) by mouth daily.    Dispense:  90 tablet    Refill:  3    Order Specific Question:   Supervising Provider    Answer:   Quentin Angst [[id number]]    Nolon Nations  APRN, MSN, FNP-C Patient Care [facility] [address] [place], [name] [address] [phone]

## 2020-08-20 ENCOUNTER — Ambulatory Visit: Payer: Medicare HMO | Admitting: Family Medicine

## 2020-09-02 ENCOUNTER — Ambulatory Visit: Payer: Medicare HMO

## 2020-09-03 DIAGNOSIS — Z780 Asymptomatic menopausal state: Secondary | ICD-10-CM | POA: Diagnosis not present

## 2020-09-11 ENCOUNTER — Other Ambulatory Visit: Payer: Self-pay

## 2020-09-11 NOTE — Patient Outreach (Signed)
[facility]) Care Management  09/11/2020  ODETH BRY 06-Apr-1950 [id number]   Telephone call to patient for disease management follow up.   No answer.  HIPAA compliant voice message left.    Plan: If no return call, RN CM will attempt patient again in the month of August.  Mateo Overbeck J Jerilee Space, RN, MSN [facility] Care Management Care Management Coordinator Direct Line [phone] Cell [phone] Toll Free: [phone]  Fax: [phone]

## 2020-09-12 ENCOUNTER — Ambulatory Visit: Payer: Self-pay

## 2020-09-24 ENCOUNTER — Ambulatory Visit: Payer: Medicare HMO | Admitting: Family Medicine

## 2020-09-26 ENCOUNTER — Encounter (HOSPITAL_COMMUNITY): Payer: Self-pay | Admitting: Emergency Medicine

## 2020-09-26 ENCOUNTER — Emergency Department (HOSPITAL_COMMUNITY): Payer: Medicare HMO

## 2020-09-26 ENCOUNTER — Emergency Department (HOSPITAL_COMMUNITY)
Admission: EM | Admit: 2020-09-26 | Discharge: 2020-09-27 | Disposition: A | Discharge status: Home / Self Care | Payer: Medicare HMO | Attending: Emergency Medicine | Admitting: Emergency Medicine

## 2020-09-26 ENCOUNTER — Other Ambulatory Visit: Payer: Self-pay

## 2020-09-26 ENCOUNTER — Ambulatory Visit (HOSPITAL_COMMUNITY)
Admission: EM | Admit: 2020-09-26 | Discharge: 2020-09-26 | Disposition: A | Discharge status: Home / Self Care | Payer: Medicare HMO

## 2020-09-26 DIAGNOSIS — S80212A Abrasion, left knee, initial encounter: Secondary | ICD-10-CM | POA: Insufficient documentation

## 2020-09-26 DIAGNOSIS — E860 Dehydration: Secondary | ICD-10-CM | POA: Diagnosis not present

## 2020-09-26 DIAGNOSIS — I129 Hypertensive chronic kidney disease with stage 1 through stage 4 chronic kidney disease, or unspecified chronic kidney disease: Secondary | ICD-10-CM | POA: Insufficient documentation

## 2020-09-26 DIAGNOSIS — W1830XA Fall on same level, unspecified, initial encounter: Secondary | ICD-10-CM | POA: Diagnosis not present

## 2020-09-26 DIAGNOSIS — Z794 Long term (current) use of insulin: Secondary | ICD-10-CM | POA: Diagnosis not present

## 2020-09-26 DIAGNOSIS — E1122 Type 2 diabetes mellitus with diabetic chronic kidney disease: Secondary | ICD-10-CM | POA: Insufficient documentation

## 2020-09-26 DIAGNOSIS — S0990XA Unspecified injury of head, initial encounter: Secondary | ICD-10-CM

## 2020-09-26 DIAGNOSIS — N183 Chronic kidney disease, stage 3 unspecified: Secondary | ICD-10-CM | POA: Diagnosis not present

## 2020-09-26 DIAGNOSIS — M25522 Pain in left elbow: Secondary | ICD-10-CM | POA: Diagnosis not present

## 2020-09-26 DIAGNOSIS — W19XXXA Unspecified fall, initial encounter: Secondary | ICD-10-CM

## 2020-09-26 DIAGNOSIS — S50812A Abrasion of left forearm, initial encounter: Secondary | ICD-10-CM

## 2020-09-26 DIAGNOSIS — R55 Syncope and collapse: Secondary | ICD-10-CM | POA: Diagnosis not present

## 2020-09-26 DIAGNOSIS — S50312A Abrasion of left elbow, initial encounter: Secondary | ICD-10-CM | POA: Insufficient documentation

## 2020-09-26 DIAGNOSIS — I10 Essential (primary) hypertension: Secondary | ICD-10-CM | POA: Diagnosis not present

## 2020-09-26 DIAGNOSIS — R410 Disorientation, unspecified: Secondary | ICD-10-CM

## 2020-09-26 DIAGNOSIS — M542 Cervicalgia: Secondary | ICD-10-CM | POA: Diagnosis not present

## 2020-09-26 DIAGNOSIS — Z79899 Other long term (current) drug therapy: Secondary | ICD-10-CM | POA: Insufficient documentation

## 2020-09-26 DIAGNOSIS — M7989 Other specified soft tissue disorders: Secondary | ICD-10-CM | POA: Diagnosis not present

## 2020-09-26 DIAGNOSIS — S59902A Unspecified injury of left elbow, initial encounter: Secondary | ICD-10-CM | POA: Diagnosis present

## 2020-09-26 DIAGNOSIS — T07XXXA Unspecified multiple injuries, initial encounter: Secondary | ICD-10-CM | POA: Diagnosis not present

## 2020-09-26 DIAGNOSIS — M25562 Pain in left knee: Secondary | ICD-10-CM | POA: Diagnosis not present

## 2020-09-26 LAB — URINALYSIS, ROUTINE W REFLEX MICROSCOPIC
Bilirubin Urine: NEGATIVE
Glucose, UA: NEGATIVE mg/dL
Hgb urine dipstick: NEGATIVE
Ketones, ur: NEGATIVE mg/dL
Nitrite: NEGATIVE
Protein, ur: NEGATIVE mg/dL
Specific Gravity, Urine: 1.008 (ref 1.005–1.030)
pH: 5 (ref 5.0–8.0)

## 2020-09-26 LAB — COMPREHENSIVE METABOLIC PANEL
ALT: 9 U/L (ref 0–44)
AST: 24 U/L (ref 15–41)
Albumin: 3.6 g/dL (ref 3.5–5.0)
Alkaline Phosphatase: 160 U/L — ABNORMAL HIGH (ref 38–126)
Anion gap: 8 (ref 5–15)
BUN: 42 mg/dL — ABNORMAL HIGH (ref 8–23)
CO2: 22 mmol/L (ref 22–32)
Calcium: 9.2 mg/dL (ref 8.9–10.3)
Chloride: 105 mmol/L (ref 98–111)
Creatinine, Ser: 2.3 mg/dL — ABNORMAL HIGH (ref 0.44–1.00)
GFR, Estimated: 23 mL/min — ABNORMAL LOW (ref >60)
Glucose, Bld: 85 mg/dL (ref 70–99)
Potassium: 5 mmol/L (ref 3.5–5.1)
Sodium: 135 mmol/L (ref 135–145)
Total Bilirubin: 1 mg/dL (ref 0.3–1.2)
Total Protein: 6.8 g/dL (ref 6.5–8.1)

## 2020-09-26 LAB — CBC WITH DIFFERENTIAL/PLATELET
Abs Immature Granulocytes: 0.04 10*3/uL (ref 0.00–0.07)
Basophils Absolute: 0.1 10*3/uL (ref 0.0–0.1)
Basophils Relative: 1 %
Eosinophils Absolute: 0.2 10*3/uL (ref 0.0–0.5)
Eosinophils Relative: 2 %
HCT: 35.8 % — ABNORMAL LOW (ref 36.0–46.0)
Hemoglobin: 10.9 g/dL — ABNORMAL LOW (ref 12.0–15.0)
Immature Granulocytes: 0 %
Lymphocytes Relative: 44 %
Lymphs Abs: 4.1 10*3/uL — ABNORMAL HIGH (ref 0.7–4.0)
MCH: 26.3 pg (ref 26.0–34.0)
MCHC: 30.4 g/dL (ref 30.0–36.0)
MCV: 86.5 fL (ref 80.0–100.0)
Monocytes Absolute: 0.6 10*3/uL (ref 0.1–1.0)
Monocytes Relative: 6 %
Neutro Abs: 4.5 10*3/uL (ref 1.7–7.7)
Neutrophils Relative %: 47 %
Platelets: 204 10*3/uL (ref 150–400)
RBC: 4.14 MIL/uL (ref 3.87–5.11)
RDW: 13.2 % (ref 11.5–15.5)
WBC: 9.5 10*3/uL (ref 4.0–10.5)
nRBC: 0 % (ref 0.0–0.2)

## 2020-09-26 LAB — TROPONIN I (HIGH SENSITIVITY): Troponin I (High Sensitivity): 7 ng/L (ref <18)

## 2020-09-26 MED ORDER — SODIUM CHLORIDE 0.9 % IV BOLUS
500.0000 mL | Freq: Once | INTRAVENOUS | Status: AC
  Administered 2020-09-26: 500 mL via INTRAVENOUS

## 2020-09-26 NOTE — ED Provider Notes (Signed)
Blood pressure (!) 178/79, pulse 75, temperature 98.1 F (36.7 C), temperature source Oral, resp. rate 16, height 5\' 4"  (1.626 m), weight 90.7 kg, SpO2 100 %.  Assuming care from Dr. .  In short, Rachel Gonzalez is a 70 y.o. female with a chief complaint of No chief complaint on file. 73  Refer to the original H&P for additional details.  The current plan of care is to follow on UA.  UA unremarkable. Stable for discharge.     Marland Kitchen, MD 09/27/20 [phone]

## 2020-09-26 NOTE — ED Triage Notes (Signed)
Tripped while outside of a funeral home this afternoon.  Left knee and left elbow/forearm has abrasions.  Left back and left hip is sore.

## 2020-09-26 NOTE — ED Triage Notes (Signed)
Pt BIBA from urgent care due to fall from standing. Patient unclear of what lead up to fall. Patient reported to have fallen outside. Patient noted to hit head with no LOC. Patient no taking any blood thinners. Patient presents alert and oriented at this time. VSS per medic with HR of 70, BP 122/74, CBG of 122, and temp of 99.5.

## 2020-09-26 NOTE — ED Provider Notes (Signed)
[facility]    CSN: [id number] Arrival date & time: 09/26/20  1736      History   Chief Complaint Chief Complaint  Patient presents with   Fall    HPI Rachel Gonzalez is a 70 y.o. female.   Patient presents to urgent care for further evaluation after a fall that occurred earlier today.  Patient is a poor historian and is not able to remember exact mechanism of fall.  Patient states that she went to a funeral home and noticed that masks were required, so she started walking back to her car and thinks that she tripped and fell.  States that she did hit her head and fell to her left side.  Patient is not sure if she lost consciousness.  Has abrasion to left forearm near elbow and left knee.  Having pain throughout left side of body.  Patient denies taking any blood thinners daily.   Fall   Past Medical History:  Diagnosis Date   Anemia    Arthritis    right finger   Chronic renal disease, stage 3, moderately decreased glomerular filtration rate (GFR) between 30-59 mL/min/1.73 square meter (HCC)    Diabetes mellitus    GERD (gastroesophageal reflux disease)    Hypertension     Patient Active Problem List   Diagnosis Date Noted   Acute metabolic encephalopathy 11/73/5670   Acute lower UTI 04/11/2020   DKA (diabetic ketoacidosis) ([place]) 04/10/2020   Altered mental status 04/10/2020   CAP (community acquired pneumonia) 04/10/2020   Pyuria 04/10/2020   Stage 3 chronic kidney disease ([place]) 12/01/2016   Frequent falls 04/28/2016   Right arm weakness 04/28/2016   Bilateral lower extremity edema 04/28/2016   Screening for colon cancer 04/28/2016   Abnormal urinalysis 09/13/2014   Chest pain 09/13/2014   Abdominal pain, recurrent 09/13/2014   Dehydration with hyponatremia 09/13/2014   Hyperkalemia 09/13/2014   Acute renal failure superimposed on stage 3 chronic kidney disease ([place]) 09/13/2014   Anemia 05/06/2014   Diabetic neuropathy, type II diabetes mellitus  ([place]) 05/06/2014   Obesity (BMI 30-39.9) 05/06/2014   Cellulitis of right foot 05/06/2014   Essential hypertension 03/07/2011   PERS HX NONCOMPLIANCE W/MED TX PRS HAZARDS HLTH 02/07/2009   Right arm pain 08/04/2006   Osteoarthritis 02/14/2006   Uncontrolled type 2 diabetes mellitus with hyperglycemia ([place]) 01/11/2006    Past Surgical History:  Procedure Laterality Date   AMPUTATION Right 05/08/2014   Procedure: AMPUTATION RAY, right great toe;  Surgeon: Newt Minion, MD;  Location: [facility];  Service: Orthopedics;  Laterality: Right;   arm surgery      CESAREAN SECTION     COLONOSCOPY     I & D EXTREMITY Right 05/08/2014   Procedure: IRRIGATION AND DEBRIDEMENT FOOT;  Surgeon: Newt Minion, MD;  Location: [facility];  Service: Orthopedics;  Laterality: Right;   MEMBRANE PEEL Right 03/15/2018   Procedure: MEMBRANE PEEL;  Surgeon: Jalene Mullet, MD;  Location: [facility];  Service: Ophthalmology;  Laterality: Right;   PHOTOCOAGULATION WITH LASER Right 03/15/2018   Procedure: PHOTOCOAGULATION WITH LASER;  Surgeon: Jalene Mullet, MD;  Location: [facility];  Service: Ophthalmology;  Laterality: Right;   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 Authorizing Provider  amLODipine (NORVASC) 5 MG tablet Take 1 tablet (5 mg total) by mouth daily. 06/18/20  Yes Dorena Dew, FNP  atorvastatin (LIPITOR) 20 MG tablet Take 1 tablet (20 mg total) by mouth daily. 04/25/20  Yes Dorena Dew, FNP  cetirizine  (ZYRTEC ALLERGY) 10 MG tablet Take 1 tablet (10 mg total) by mouth daily. 06/18/20  Yes Dorena Dew, FNP  furosemide (LASIX) 20 MG tablet Take 1 tablet (20 mg total) by mouth daily. 08/13/20 08/13/21 Yes Dorena Dew, FNP  gabapentin (NEURONTIN) 300 MG capsule Take 1 capsule (300 mg total) by mouth 2 (two) times daily. 05/14/20  Yes Dorena Dew, FNP  insulin glargine (LANTUS SOLOSTAR) 100 UNIT/ML Solostar Pen Take 15 units in the morning and 10 units at bedtime. 07/23/20  Yes Dorena Dew, FNP  lisinopril (ZESTRIL) 10 MG tablet Take 1 tablet (10 mg total) by mouth daily. 08/13/20  Yes Dorena Dew, FNP  Accu-Chek Softclix Lancets lancets Use 1-4 times daily as needed DX E11.9 04/25/20   Dorena Dew, FNP  acetaminophen (TYLENOL) 500 MG tablet Take 500-1,000 mg by mouth every 6 (six) hours as needed (pain.).    [provider]  Blood Glucose Monitoring Suppl (ACCU-CHEK GUIDE) w/Device KIT 1 Device by Does not apply route 4 (four) times daily. 04/25/20   Dorena Dew, FNP  glucose blood (ACCU-CHEK GUIDE) test strip Use as instructed 06/18/20   Dorena Dew, FNP  omeprazole (PRILOSEC) 20 MG capsule Take 1 capsule (20 mg total) by mouth daily. 08/13/20   Dorena Dew, FNP  RELION PEN NEEDLE 31G/8MM 31G X 8 MM MISC USE AS DIRECTED 4 TIMES DAILY 12/20/17   Lanae Boast, FNP  Triamcinolone Acetonide (TRIAMCINOLONE 0.1 % CREAM : EUCERIN) CREA Apply 1 application topically 3 (three) times daily as needed. 05/23/19   Dorena Dew, FNP  triamcinolone ointment (KENALOG) 0.5 % Apply 1 application topically 2 (two) times daily. 06/18/20   Dorena Dew, FNP    Family History Family History  Problem Relation Age of Onset   Diabetes Mother     Social History Social History   Tobacco Use   Smoking status: Never   Smokeless tobacco: Never  Vaping Use   Vaping Use: Never used  Substance Use Topics   Alcohol use: No   Drug use: No     Allergies    Tramadol, Adhesive [tape], Bactrim [sulfamethoxazole-trimethoprim], and Tylenol with codeine #3 [acetaminophen-codeine]   Review of Systems Review of Systems Per HPI  Physical Exam Triage Vital Signs ED Triage Vitals  Enc Vitals Group     BP 09/26/20 1825 103/63     Pulse Rate 09/26/20 1825 71     Resp 09/26/20 1825 20     Temp 09/26/20 1825 99.5 F (37.5 C)     Temp Source 09/26/20 1825 Oral     SpO2 09/26/20 1825 97 %     Weight --  Height --      Head Circumference --      Peak Flow --      Pain Score 09/26/20 1821 8     Pain Loc --      Pain Edu? --      Excl. in [place]? --    No data found.  Updated Vital Signs BP 103/63 (BP Location: Right Arm)   Pulse 71   Temp 99.5 F (37.5 C) (Oral)   Resp 20   SpO2 97%   Visual Acuity Right Eye Distance:   Left Eye Distance:   Bilateral Distance:    Right Eye Near:   Left Eye Near:    Bilateral Near:     Physical Exam Constitutional:      General: She is not in acute distress.    Appearance: Normal appearance.     Comments: Patient seems very disoriented and is not able to give history of fall or answer any questions due to disorientation.  Patient is complaining of being "sleepy" and appears actively drowsy during exam.  HENT:     Head: Normocephalic and atraumatic.  Eyes:     Extraocular Movements: Extraocular movements intact.     Conjunctiva/sclera: Conjunctivae normal.  Cardiovascular:     Rate and Rhythm: Normal rate and regular rhythm.     Pulses: Normal pulses.     Heart sounds: Normal heart sounds.  Pulmonary:     Effort: Pulmonary effort is normal. No respiratory distress.     Breath sounds: Normal breath sounds.  Musculoskeletal:     Cervical back: Normal range of motion.  Skin:    General: Skin is warm and dry.  Neurological:     General: No focal deficit present.     Mental Status: She is disoriented and confused.     GCS: GCS eye subscore is 4. GCS verbal subscore is 4. GCS motor subscore  is 6.     Cranial Nerves: No cranial nerve deficit.     Sensory: Sensation is intact.     Motor: Motor function is intact.     Coordination: Coordination is intact.     Gait: Gait is intact.     Comments: Bilateral pupils not reactive.  Patient has history of "eye surgery" in both eyes and patient is not able to verbalize if this is normal for her.  Psychiatric:        Mood and Affect: Mood normal.        Behavior: Behavior normal.        Thought Content: Thought content normal.        Judgment: Judgment normal.     UC Treatments / Results  Labs (all labs ordered are listed, but only abnormal results are displayed) Labs Reviewed - No data to display  EKG   Radiology No results found.  Procedures Procedures (including critical care time)  Medications Ordered in UC Medications - No data to display  Initial Impression / Assessment and Plan / UC Course  I have reviewed the triage vital signs and the nursing notes.  Pertinent labs & imaging results that were available during my care of the patient were reviewed by me and considered in my medical decision making (see chart for details).     Worrisome clinical signs and symptoms after head injury including drowsiness, disorientation, and nonreactive pupils.  Advised patient that she should go to the hospital via EMS for further evaluation and management.  Patient was agreeable with plan and left  to go to the hospital via EMS.  Dressings applied to abrasions. Final Clinical Impressions(s) / UC Diagnoses   Final diagnoses:  Fall, initial encounter  Injury of head, initial encounter  Disorientation  Abrasion of left forearm, initial encounter  Abrasion, left knee, initial encounter     Discharge Instructions      Patient was sent to the hospital via EMS for further evaluation and management.     ED Prescriptions   None    PDMP not reviewed this encounter.   Odis Luster, FNP 09/26/20 1918

## 2020-09-26 NOTE — ED Provider Notes (Signed)
[facility] EMERGENCY DEPARTMENT Provider Note   CSN: [id number] Arrival date & time: 09/26/20  1948     History No chief complaint on file.   Rachel Gonzalez is a 70 y.o. female.  Presents to ER with concern for fall.  Patient had fall earlier today while she was at a funeral home.  Patient initially went to urgent care but due to concern for confusion and head trauma she was sent to ER for further eval.  Patient states that she is unsure exactly how she fell but does think that she hit her head.  Thinks that she had some sort of trip event.  Not having any neck pain.  Did have an abrasion to her left elbow and left knee.  Has been able to walk.  Does not feel confused at present.  Does not have any chest pain or abdominal pain or difficulty in breathing.  No numbness, weakness, speech change, vision change.  HPI     Past Medical History:  Diagnosis Date   Anemia    Arthritis    right finger   Chronic renal disease, stage 3, moderately decreased glomerular filtration rate (GFR) between 30-59 mL/min/1.73 square meter (HCC)    Diabetes mellitus    GERD (gastroesophageal reflux disease)    Hypertension     Patient Active Problem List   Diagnosis Date Noted   Acute metabolic encephalopathy 65/53/7482   Acute lower UTI 04/11/2020   DKA (diabetic ketoacidosis) ([place]) 04/10/2020   Altered mental status 04/10/2020   CAP (community acquired pneumonia) 04/10/2020   Pyuria 04/10/2020   Stage 3 chronic kidney disease ([place]) 12/01/2016   Frequent falls 04/28/2016   Right arm weakness 04/28/2016   Bilateral lower extremity edema 04/28/2016   Screening for colon cancer 04/28/2016   Abnormal urinalysis 09/13/2014   Chest pain 09/13/2014   Abdominal pain, recurrent 09/13/2014   Dehydration with hyponatremia 09/13/2014   Hyperkalemia 09/13/2014   Acute renal failure superimposed on stage 3 chronic kidney disease ([place]) 09/13/2014   Anemia 05/06/2014   Diabetic  neuropathy, type II diabetes mellitus ([place]) 05/06/2014   Obesity (BMI 30-39.9) 05/06/2014   Cellulitis of right foot 05/06/2014   Essential hypertension 03/07/2011   PERS HX NONCOMPLIANCE W/MED TX PRS HAZARDS HLTH 02/07/2009   Right arm pain 08/04/2006   Osteoarthritis 02/14/2006   Uncontrolled type 2 diabetes mellitus with hyperglycemia ([place]) 01/11/2006    Past Surgical History:  Procedure Laterality Date   AMPUTATION Right 05/08/2014   Procedure: AMPUTATION RAY, right great toe;  Surgeon: Newt Minion, MD;  Location: [facility];  Service: Orthopedics;  Laterality: Right;   arm surgery      CESAREAN SECTION     COLONOSCOPY     I & D EXTREMITY Right 05/08/2014   Procedure: IRRIGATION AND DEBRIDEMENT FOOT;  Surgeon: Newt Minion, MD;  Location: [facility];  Service: Orthopedics;  Laterality: Right;   MEMBRANE PEEL Right 03/15/2018   Procedure: MEMBRANE PEEL;  Surgeon: Jalene Mullet, MD;  Location: [facility];  Service: Ophthalmology;  Laterality: Right;   PHOTOCOAGULATION WITH LASER Right 03/15/2018   Procedure: PHOTOCOAGULATION WITH LASER;  Surgeon: Jalene Mullet, MD;  Location: 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/2018   Procedure: RIGHT EYE COMPLEX RETINA DETACHMENT VITRECTOMY MEMBRANE PEEL WITH AIR,GAS,SILICONE OIL PHOTOCOAGULATION;  Surgeon: Jalene Mullet, MD;  Location: [facility];  Service: Ophthalmology;  Laterality: Right;     OB  History     Gravida  3   Para      Term      Preterm      AB      Living  3      SAB      IAB      Ectopic      Multiple      Live Births              Family History  Problem Relation Age of Onset   Diabetes Mother     Social History   Tobacco Use   Smoking status: Never   Smokeless tobacco: Never  Vaping Use   Vaping Use: Never used  Substance Use Topics   Alcohol use: No   Drug use: No    Home Medications Prior to Admission medications   Medication Sig  Start Date End Date Taking? Authorizing Provider  acetaminophen (TYLENOL) 500 MG tablet Take 500-1,000 mg by mouth every 6 (six) hours as needed (pain.).   Yes [provider]  amLODipine (NORVASC) 5 MG tablet Take 1 tablet (5 mg total) by mouth daily. 06/18/20  Yes Dorena Dew, FNP  atorvastatin (LIPITOR) 20 MG tablet Take 1 tablet (20 mg total) by mouth daily. 04/25/20  Yes Dorena Dew, FNP  cetirizine (ZYRTEC ALLERGY) 10 MG tablet Take 1 tablet (10 mg total) by mouth daily. 06/18/20  Yes Dorena Dew, FNP  furosemide (LASIX) 20 MG tablet Take 1 tablet (20 mg total) by mouth daily. 08/13/20 08/13/21 Yes Dorena Dew, FNP  gabapentin (NEURONTIN) 300 MG capsule Take 1 capsule (300 mg total) by mouth 2 (two) times daily. 05/14/20  Yes Dorena Dew, FNP  insulin glargine (LANTUS SOLOSTAR) 100 UNIT/ML Solostar Pen Take 15 units in the morning and 10 units at bedtime. 07/23/20  Yes Dorena Dew, FNP  lisinopril (ZESTRIL) 10 MG tablet Take 1 tablet (10 mg total) by mouth daily. 08/13/20  Yes Dorena Dew, FNP  omeprazole (PRILOSEC) 20 MG capsule Take 1 capsule (20 mg total) by mouth daily. 08/13/20  Yes Dorena Dew, FNP  triamcinolone ointment (KENALOG) 0.5 % Apply 1 application topically 2 (two) times daily. 06/18/20  Yes Dorena Dew, FNP  Accu-Chek Softclix Lancets lancets Use 1-4 times daily as needed DX E11.9 04/25/20   Dorena Dew, FNP  Blood Glucose Monitoring Suppl (ACCU-CHEK GUIDE) w/Device KIT 1 Device by Does not apply route 4 (four) times daily. 04/25/20   Dorena Dew, FNP  glucose blood (ACCU-CHEK GUIDE) test strip Use as instructed 06/18/20   Dorena Dew, FNP  RELION PEN NEEDLE 31G/8MM 31G X 8 MM MISC USE AS DIRECTED 4 TIMES DAILY 12/20/17   Lanae Boast, FNP  Triamcinolone Acetonide (TRIAMCINOLONE 0.1 % CREAM : EUCERIN) CREA Apply 1 application topically 3 (three) times daily as needed. Patient not taking: No sig reported 05/23/19    Dorena Dew, FNP    Allergies    Tramadol, Adhesive [tape], Bactrim [sulfamethoxazole-trimethoprim], and Tylenol with codeine #3 [acetaminophen-codeine]  Review of Systems   Review of Systems  Constitutional:  Negative for chills and fever.  HENT:  Negative for ear pain and sore throat.   Eyes:  Negative for pain and visual disturbance.  Respiratory:  Negative for cough and shortness of breath.   Cardiovascular:  Negative for chest pain and palpitations.  Gastrointestinal:  Negative for abdominal pain and vomiting.  Genitourinary:  Negative for dysuria and hematuria.  Musculoskeletal:  Positive for arthralgias. Negative for back pain.  Skin:  Negative for color change and rash.  Neurological:  Positive for headaches. Negative for seizures and syncope.  All other systems reviewed and are negative.  Physical Exam Updated Vital Signs BP (!) 178/79 (BP Location: Right Arm)   Pulse 75   Temp 98.1 F (36.7 C) (Oral)   Resp 16   Ht _0  (1.626 m)   Wt 90.7 kg   SpO2 100%   BMI 34.33 kg/m   Physical Exam Vitals and nursing note reviewed.  Constitutional:      General: She is not in acute distress.    Appearance: She is well-developed.  HENT:     Head: Normocephalic and atraumatic.  Eyes:     Conjunctiva/sclera: Conjunctivae normal.  Cardiovascular:     Rate and Rhythm: Normal rate and regular rhythm.     Heart sounds: No murmur heard. Pulmonary:     Effort: Pulmonary effort is normal. No respiratory distress.     Breath sounds: Normal breath sounds.  Abdominal:     Palpations: Abdomen is soft.     Tenderness: There is no abdominal tenderness.  Musculoskeletal:     Cervical back: Neck supple.     Comments: Back: no C, T, L spine TTP, no step off or deformity RUE: no TTP throughout, no deformity, normal joint ROM, radial pulse intact, distal sensation and motor intact LUE: Superficial abrasion to the elbow, no laceration, some tenderness to elbow, normal joint ROM,  radial pulse intact, distal sensation and motor intact RLE:  no TTP throughout, no deformity, normal joint ROM, distal pulse, sensation and motor intact LLE: Superficial abrasion to the left knee, some knee tenderness, normal joint ROM, distal pulse, sensation and motor intact  Skin:    General: Skin is warm and dry.  Neurological:     Mental Status: She is alert.    ED Results / Procedures / Treatments   Labs (all labs ordered are listed, but only abnormal results are displayed) Labs Reviewed  CBC WITH DIFFERENTIAL/PLATELET - Abnormal; Notable for the following components:      Result Value   Hemoglobin 10.9 (*)    HCT 35.8 (*)    Lymphs Abs 4.1 (*)    All other components within normal limits  COMPREHENSIVE METABOLIC PANEL - Abnormal; Notable for the following components:   BUN 42 (*)    Creatinine, Ser 2.30 (*)    Alkaline Phosphatase 160 (*)    GFR, Estimated 23 (*)    All other components within normal limits  URINALYSIS, ROUTINE W REFLEX MICROSCOPIC  TROPONIN I (HIGH SENSITIVITY)  TROPONIN I (HIGH SENSITIVITY)    EKG EKG Interpretation  Date/Time:  Thursday September 26 2020 22:39:51 EDT Ventricular Rate:  67 PR Interval:  168 QRS Duration: 86 QT Interval:  422 QTC Calculation: 446 R Axis:   39 Text Interpretation: Sinus rhythm Confirmed by Madalyn Rob [phone]) on 09/26/2020 10:46:51 PM  Radiology DG Chest 2 View  Result Date: 09/26/2020 CLINICAL DATA:  Fall, syncope EXAM: CHEST - 2 VIEW COMPARISON:  04/10/2020 FINDINGS: Lungs are clear.  No pleural effusion or pneumothorax. The heart is normal in size. Visualized osseous structures are within normal limits. IMPRESSION: Normal chest radiographs. Electronically Signed   By: Julian Hy M.D.   On: 09/26/2020 22:24   DG Elbow Complete Left  Result Date: 09/26/2020 CLINICAL DATA:  Tripped, left elbow pain EXAM: LEFT ELBOW - COMPLETE 3+ VIEW COMPARISON:  01/13/2009  FINDINGS: Frontal, bilateral oblique, lateral  views of the left elbow are obtained. No fracture, subluxation, or dislocation. Joint spaces are well preserved. No joint effusion. Mild dorsal soft tissue swelling. IMPRESSION: 1. Dorsal soft tissue swelling.  No acute fracture. Electronically Signed   By: Randa Ngo M.D.   On: 09/26/2020 22:23   CT Head Wo Contrast  Result Date: 09/26/2020 CLINICAL DATA:  Fall, syncope EXAM: CT HEAD WITHOUT CONTRAST CT CERVICAL SPINE WITHOUT CONTRAST TECHNIQUE: Multidetector CT imaging of the head and cervical spine was performed following the standard protocol without intravenous contrast. Multiplanar CT image reconstructions of the cervical spine were also generated. COMPARISON:  CT head dated 04/10/2020 FINDINGS: CT HEAD FINDINGS Brain: No evidence of acute infarction, hemorrhage, hydrocephalus, extra-axial collection or mass lesion/mass effect. Mild age related atrophy. Mild subcortical white matter and periventricular small vessel ischemic changes. Vascular: Intracranial atherosclerosis. Skull: Normal. Negative for fracture or focal lesion. Old medial orbital wall fracture. Sinuses/Orbits: The visualized paranasal sinuses are essentially clear. The mastoid air cells are unopacified. Other: None. CT CERVICAL SPINE FINDINGS Alignment: Straightening of the cervical spine, likely positional. Skull base and vertebrae: No acute fracture. No primary bone lesion or focal pathologic process. Soft tissues and spinal canal: No prevertebral fluid or swelling. No visible canal hematoma. Disc levels:  Mild fusion at C6-7.  Spinal canal is patent. Upper chest: Visualized lung apices are clear. Other: Visualized thyroid is unremarkable. IMPRESSION: No evidence of acute intracranial abnormality. Mild atrophy with small vessel ischemic changes. No evidence of traumatic injury to the cervical spine. Electronically Signed   By: Julian Hy M.D.   On: 09/26/2020 22:26   CT Cervical Spine Wo Contrast  Result Date:  09/26/2020 CLINICAL DATA:  Fall, syncope EXAM: CT HEAD WITHOUT CONTRAST CT CERVICAL SPINE WITHOUT CONTRAST TECHNIQUE: Multidetector CT imaging of the head and cervical spine was performed following the standard protocol without intravenous contrast. Multiplanar CT image reconstructions of the cervical spine were also generated. COMPARISON:  CT head dated 04/10/2020 FINDINGS: CT HEAD FINDINGS Brain: No evidence of acute infarction, hemorrhage, hydrocephalus, extra-axial collection or mass lesion/mass effect. Mild age related atrophy. Mild subcortical white matter and periventricular small vessel ischemic changes. Vascular: Intracranial atherosclerosis. Skull: Normal. Negative for fracture or focal lesion. Old medial orbital wall fracture. Sinuses/Orbits: The visualized paranasal sinuses are essentially clear. The mastoid air cells are unopacified. Other: None. CT CERVICAL SPINE FINDINGS Alignment: Straightening of the cervical spine, likely positional. Skull base and vertebrae: No acute fracture. No primary bone lesion or focal pathologic process. Soft tissues and spinal canal: No prevertebral fluid or swelling. No visible canal hematoma. Disc levels:  Mild fusion at C6-7.  Spinal canal is patent. Upper chest: Visualized lung apices are clear. Other: Visualized thyroid is unremarkable. IMPRESSION: No evidence of acute intracranial abnormality. Mild atrophy with small vessel ischemic changes. No evidence of traumatic injury to the cervical spine. Electronically Signed   By: Julian Hy M.D.   On: 09/26/2020 22:26   DG Knee Complete 4 Views Left  Result Date: 09/26/2020 CLINICAL DATA:  Left knee pain, fall EXAM: LEFT KNEE - COMPLETE 4+ VIEW COMPARISON:  None. FINDINGS: No definite fracture is seen. While there is mild irregularity in the medial tibial rim on the frontal radiograph, this area looks normal on additional views, and there is no associated findings to suggest a fracture in this location. The joint  spaces are preserved. Visualized soft tissues are within normal limits. No suprapatellar knee joint effusion. IMPRESSION: Negative.  Electronically Signed   By: Julian Hy M.D.   On: 09/26/2020 22:23    Procedures Procedures   Medications Ordered in ED Medications  sodium chloride 0.9 % bolus 500 mL (has no administration in time range)    ED Course  I have reviewed the triage vital signs and the nursing notes.  Pertinent labs & imaging results that were available during my care of the patient were reviewed by me and considered in my medical decision making (see chart for details).    MDM Rules/Calculators/A&P                           70 year old lady presents to ER after having fall.  Went originally to urgent care but due to report of head trauma and their concern for confusion she was sent to ER for further eval.  Here, patient is alert and oriented.  Does have some difficulty recalling specifics about her fall.  No events on telemetry monitoring, EKG unremarkable.  Basic labs are stable except for slight elevation in creatinine.  Baseline creatinine 1.7-1.9, up to 2.3 today.  BUN somewhat elevated as well.  Suspect dehydration.  Provided some fluids.  Patient is tolerating p.o. without difficulty as well.  CT imaging negative, plain films negative.  While awaiting UA, signed out to Dr. Laverta Baltimore.  Anticipate discharge once this has been completed.  Recommended recheck with primary doctor early next week for repeat labs.   Final Clinical Impression(s) / ED Diagnoses Final diagnoses:  Dehydration  Traumatic injury of head, initial encounter    Rx / DC Orders ED Discharge Orders     None        Lucrezia Starch, MD 09/26/20 2324

## 2020-09-26 NOTE — ED Notes (Signed)
EMS called, as [facility] was called and they did not have a truck available.   ED charge nurse called and reported pt will go by EMS.

## 2020-09-26 NOTE — Discharge Instructions (Signed)
Patient was sent to the hospital via EMS for further evaluation and management.

## 2020-09-26 NOTE — Discharge Instructions (Addendum)
Please follow-up with your primary care doctor early next week.  Your blood work showed that your somewhat dehydrated today.  Please drink plenty of water and rest over the weekend.  You should have your kidney function and your electrolytes rechecked.  If you develop any confusion, vomiting, chest pain, difficulty breathing, fever or other new concerning symptom, come back to ER for reassessment.

## 2020-09-27 LAB — TROPONIN I (HIGH SENSITIVITY): Troponin I (High Sensitivity): 6 ng/L (ref <18)

## 2020-10-22 ENCOUNTER — Ambulatory Visit: Payer: Medicare HMO | Admitting: Family Medicine

## 2020-10-24 ENCOUNTER — Ambulatory Visit: Payer: Medicare HMO | Admitting: Family Medicine

## 2020-10-29 ENCOUNTER — Other Ambulatory Visit: Payer: Self-pay

## 2020-10-29 NOTE — Patient Outreach (Signed)
[facility]) Care Management  10/29/2020  Rachel Gonzalez 13-Jan-1951 [id number]   Telephone call to patient for disease management follow up.   No answer.  HIPAA compliant voice message left.    Plan: If no return call, RN CM will attempt patient again in October.  [name], RN, MSN [facility] Care Management Care Management Coordinator Direct Line [phone] Cell [phone] Toll Free: [phone]  Fax: [phone]

## 2020-11-08 DIAGNOSIS — H35373 Puckering of macula, bilateral: Secondary | ICD-10-CM | POA: Diagnosis not present

## 2020-11-08 DIAGNOSIS — H401112 Primary open-angle glaucoma, right eye, moderate stage: Secondary | ICD-10-CM | POA: Diagnosis not present

## 2020-11-08 DIAGNOSIS — Z961 Presence of intraocular lens: Secondary | ICD-10-CM | POA: Diagnosis not present

## 2020-11-08 DIAGNOSIS — E113513 Type 2 diabetes mellitus with proliferative diabetic retinopathy with macular edema, bilateral: Secondary | ICD-10-CM | POA: Diagnosis not present

## 2020-11-22 ENCOUNTER — Other Ambulatory Visit (HOSPITAL_COMMUNITY): Payer: Self-pay

## 2020-12-02 ENCOUNTER — Telehealth: Payer: Self-pay | Admitting: Family Medicine

## 2020-12-02 NOTE — Telephone Encounter (Signed)
MADE IN ERROR °

## 2020-12-02 NOTE — Telephone Encounter (Signed)
LVM for pt to call the office to schedule AWV-I. Please schedule this appt if pt calls the office.   Thanks  

## 2020-12-10 ENCOUNTER — Encounter: Payer: Self-pay | Admitting: Family Medicine

## 2020-12-10 ENCOUNTER — Other Ambulatory Visit: Payer: Self-pay

## 2020-12-10 ENCOUNTER — Ambulatory Visit (INDEPENDENT_AMBULATORY_CARE_PROVIDER_SITE_OTHER): Payer: Medicare HMO | Admitting: Family Medicine

## 2020-12-10 VITALS — BP 146/80 | HR 70 | Temp 97.7°F | Ht 64.0 in | Wt 204.0 lb

## 2020-12-10 DIAGNOSIS — Z23 Encounter for immunization: Secondary | ICD-10-CM | POA: Diagnosis not present

## 2020-12-10 DIAGNOSIS — I10 Essential (primary) hypertension: Secondary | ICD-10-CM

## 2020-12-10 DIAGNOSIS — E785 Hyperlipidemia, unspecified: Secondary | ICD-10-CM

## 2020-12-10 DIAGNOSIS — E1165 Type 2 diabetes mellitus with hyperglycemia: Secondary | ICD-10-CM | POA: Diagnosis not present

## 2020-12-10 DIAGNOSIS — N1831 Chronic kidney disease, stage 3a: Secondary | ICD-10-CM

## 2020-12-10 DIAGNOSIS — R82998 Other abnormal findings in urine: Secondary | ICD-10-CM | POA: Diagnosis not present

## 2020-12-10 LAB — POCT URINALYSIS DIP (CLINITEK)
Bilirubin, UA: NEGATIVE
Glucose, UA: NEGATIVE mg/dL
Ketones, POC UA: NEGATIVE mg/dL
Nitrite, UA: NEGATIVE
POC PROTEIN,UA: 100 — AB
Spec Grav, UA: 1.02 (ref 1.010–1.025)
Urobilinogen, UA: 0.2 E.U./dL
pH, UA: 6 (ref 5.0–8.0)

## 2020-12-10 LAB — POCT GLYCOSYLATED HEMOGLOBIN (HGB A1C)
HbA1c POC (<> result, manual entry): 10.6 % (ref 4.0–5.6)
HbA1c, POC (controlled diabetic range): 10.6 % — AB (ref 0.0–7.0)
HbA1c, POC (prediabetic range): 10.6 % — AB (ref 5.7–6.4)
Hemoglobin A1C: 10.6 % — AB (ref 4.0–5.6)

## 2020-12-10 MED ORDER — TRULICITY 1.5 MG/0.5ML ~~LOC~~ SOAJ: 1.5000 mg | SUBCUTANEOUS | 11 refills | Status: DC

## 2020-12-10 NOTE — Patient Instructions (Addendum)
[place] scar treatment  Diabetes Mellitus Action Plan Following a diabetes action plan is a way for you to manage your diabetes (diabetes mellitus) symptoms. The plan is color-coded to help you understand what actions you need to take based on any symptoms you are having. If you have symptoms in the red zone, you need medical care right away. If you have symptoms in the yellow zone, you are having problems. If you have symptoms in the green zone, you are doing well. Learning about and understanding diabetes can take time. Follow the plan that you develop with your health care provider. Know the target range for your blood sugar (glucose) level, and review your treatment plan with your health care provider at each visit. The target range for my blood sugar level is __________________________ mg/dL. Red zone Get medical help right away if you have any of the following symptoms: A blood sugar test result that is below 54 mg/dL (3 mmol/L). A blood sugar test result that is at or above 240 mg/dL (46.5 mmol/L) for 2 days in a row. Confusion or trouble thinking clearly. Difficulty breathing. Sickness or a fever for 2 or more days that is not getting better. Moderate or large ketone levels in your urine. Feeling tired or having no energy. If you have any red zone symptoms, do not wait to see if the symptoms will go away. Get medical help right away. Call your local emergency services (911 in the [place] Do not drive yourself to the hospital. If you have severely low blood sugar (severe hypoglycemia) and you cannot eat or drink, you may need glucagon. Make sure a family member or close friend knows how to check your blood sugar and how to give you glucagon. You may need to be treated in a hospital for this condition. Yellow zone If you have any of the following symptoms, your diabetes is not under control and you may need to make some changes: A blood sugar test result that is at or above 240 mg/dL (68.1  mmol/L) for 2 days in a row. Blood sugar test results that are below 70 mg/dL (3.9 mmol/L). Other symptoms of hypoglycemia, such as: Shaking or feeling light-headed. Confusion or irritability. Feeling hungry. Having a fast heartbeat. If you have any yellow zone symptoms: Treat your hypoglycemia by eating or drinking 15 grams of a rapid-acting carbohydrate. Follow the 15:15 rule: Take 15 grams of a rapid-acting carbohydrate, such as: 1 tube of glucose gel. 4 glucose pills. 4 oz (120 mL) of fruit juice. 4 oz (120 mL) of regular (not diet) soda. Check your blood sugar 15 minutes after you take the carbohydrate. If the repeat blood sugar test is still at or below 70 mg/dL (3.9 mmol/L), take 15 grams of a carbohydrate again. If your blood sugar does not increase above 70 mg/dL (3.9 mmol/L) after 3 tries, get medical help right away. After your blood sugar returns to normal, eat a meal or a snack within 1 hour. Keep taking your daily medicines as told by your health care provider. Check your blood sugar more often than you normally would. Write down your results. Call your health care provider if you have trouble keeping your blood sugar in your target range.  Green zone These signs mean you are doing well and you can continue what you are doing to manage your diabetes: Your blood sugar is within your personal target range. For most people, a blood sugar level before a meal (preprandial) should be 80-130 mg/dL (  4.4-7.2 mmol/L). You feel well, and you are able to do daily activities. If you are in the green zone, continue to manage your diabetes as told by your health care provider. To do this: Eat a healthy diet. Exercise regularly. Check your blood sugar as told by your health care provider. Take your medicines as told by your health care provider.  Where to find more information American Diabetes Association (ADA): diabetes.org Association of Diabetes Care & Education Specialists  (ADCES): diabeteseducator.org Summary Following a diabetes action plan is a way for you to manage your diabetes symptoms. The plan is color-coded to help you understand what actions you need to take based on any symptoms you are having. Follow the plan that you develop with your health care provider. Make sure you know your personal target blood sugar level. Review your treatment plan with your health care provider at each visit. This information is not intended to replace advice given to you by your health care provider. Make sure you discuss any questions you have with your health care provider. Document Revised: 08/31/2019 Document Reviewed: 08/31/2019 Elsevier Patient Education  2022 [employer].

## 2020-12-10 NOTE — Progress Notes (Signed)
Patient [facility]  Established Patient Office Visit  Subjective:  Patient ID: Rachel Gonzalez, female    DOB: 04/17/1950  Age: 70 y.o. MRN: [id number]  CC:  Chief Complaint  Patient presents with   Diabetes    Pt is here today for her follow up visit for Diabetes.     HPI [name] is a 70 year old female with a medical history significant for type 2 diabetes mellitus, hypertension, hyperlipidemia, obesity, and chronic kidney disease that presents for follow-up of chronic disease.  Patient states that she has been doing well and has minimal complaints on today.  She reports that she recently started a job and feels very happy.  Over the past several months she has decreased her weight.  She has been taking all of her medications consistently.  She has been lost to follow-up with her podiatrist.  She denies any new foot ulceration.  She denies blurry vision, polyuria, polydipsia, or polyphagia.  Patient is up-to-date with yearly eye exam. Patient also has a history of hypertension.  She does not check her blood pressures at home.  She has been taking her medications consistently.  She did not take medication prior to arrival.  She denies any lower extremity swelling, chest pain, shortness of breath, or orthopnea.  Patient has a family history of heart disease.  And has personal history of chronic kidney disease but is not followed by nephrology.  Past Medical History:  Diagnosis Date   Anemia    Arthritis    right finger   Chronic renal disease, stage 3, moderately decreased glomerular filtration rate (GFR) between 30-59 mL/min/1.73 square meter (HCC)    Diabetes mellitus    GERD (gastroesophageal reflux disease)    Hypertension     Past Surgical History:  Procedure Laterality Date   AMPUTATION Right 05/08/2014   Procedure: AMPUTATION RAY, right great toe;  Surgeon: Newt Minion, MD;  Location: [place];  Service: Orthopedics;  Laterality:  Right;   arm surgery      CESAREAN SECTION     COLONOSCOPY     I & D EXTREMITY Right 05/08/2014   Procedure: IRRIGATION AND DEBRIDEMENT FOOT;  Surgeon: Newt Minion, MD;  Location: [place];  Service: Orthopedics;  Laterality: Right;   MEMBRANE PEEL Right 03/15/2018   Procedure: MEMBRANE PEEL;  Surgeon: Jalene Mullet, MD;  Location: [place];  Service: Ophthalmology;  Laterality: Right;   PHOTOCOAGULATION WITH LASER Right 03/15/2018   Procedure: PHOTOCOAGULATION WITH LASER;  Surgeon: Jalene Mullet, MD;  Location: [place];  Service: Ophthalmology;  Laterality: Right;   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/2018   Procedure: RIGHT EYE COMPLEX RETINA DETACHMENT VITRECTOMY MEMBRANE PEEL WITH AIR,GAS,SILICONE OIL PHOTOCOAGULATION;  Surgeon: Jalene Mullet, MD;  Location: [place];  Service: Ophthalmology;  Laterality: Right;    Family History  Problem Relation Age of Onset   Diabetes Mother     Social History   Socioeconomic History   Marital status: Married    Spouse name: Not on file   Number of children: Not on file   Years of education: Not on file   Highest education level: Not on file  Occupational History   Not on file  Tobacco Use   Smoking status: Never   Smokeless tobacco: Never  Vaping Use   Vaping Use: Never used  Substance and Sexual Activity   Alcohol use: No   Drug use: No   Sexual  activity: Not on file  Other Topics Concern   Not on file  Social History Narrative   From [place].   Married.   Three children, 9 grandchildren.   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   Travels to [place], [place]   Social Determinants of Health   Financial Resource Strain: Not on file  Food Insecurity: Not on file  Transportation Needs: No Transportation Needs   Lack of Transportation (Medical): No   Lack of Transportation (Non-Medical): No  Physical Activity: Not on file  Stress: Not on file  Social Connections:  Not on file  Intimate Partner Violence: Not on file    Outpatient Medications Prior to Visit  Medication Sig Dispense Refill   Accu-Chek Softclix Lancets lancets Use 1-4 times daily as needed DX E11.9 360 each 3   acetaminophen (TYLENOL) 500 MG tablet Take 500-1,000 mg by mouth every 6 (six) hours as needed (pain.).     amLODipine (NORVASC) 5 MG tablet Take 1 tablet (5 mg total) by mouth daily. 90 tablet 1   atorvastatin (LIPITOR) 20 MG tablet Take 1 tablet (20 mg total) by mouth daily. 90 tablet 3   Blood Glucose Monitoring Suppl (ACCU-CHEK GUIDE) w/Device KIT 1 Device by Does not apply route 4 (four) times daily. 1 kit 0   cetirizine (ZYRTEC ALLERGY) 10 MG tablet Take 1 tablet (10 mg total) by mouth daily. 30 tablet 11   furosemide (LASIX) 20 MG tablet Take 1 tablet (20 mg total) by mouth daily. 90 tablet 3   gabapentin (NEURONTIN) 300 MG capsule Take 1 capsule (300 mg total) by mouth 2 (two) times daily. 90 capsule 1   glucose blood (ACCU-CHEK GUIDE) test strip Use as instructed 100 each 12   insulin glargine (LANTUS SOLOSTAR) 100 UNIT/ML Solostar Pen Take 15 units in the morning and 10 units at bedtime. 15 mL 5   lisinopril (ZESTRIL) 10 MG tablet Take 1 tablet (10 mg total) by mouth daily. 90 tablet 3   omeprazole (PRILOSEC) 20 MG capsule Take 1 capsule (20 mg total) by mouth daily. 90 capsule 3   RELION PEN NEEDLE 31G/8MM 31G X 8 MM MISC USE AS DIRECTED 4 TIMES DAILY 100 each 11   triamcinolone ointment (KENALOG) 0.5 % Apply 1 application topically 2 (two) times daily. 30 g 1   Triamcinolone Acetonide (TRIAMCINOLONE 0.1 % CREAM : EUCERIN) CREA Apply 1 application topically 3 (three) times daily as needed. (Patient not taking: No sig reported) 1 each 5   No facility-administered medications prior to visit.    Allergies  Allergen Reactions   Tramadol Nausea And Vomiting   Adhesive [Tape] Itching   Bactrim [Sulfamethoxazole-Trimethoprim] Other (See Comments)    AKI and hyperkalemia     Tylenol With Codeine #3 [Acetaminophen-Codeine] Diarrhea and Itching    Only allergic to Codeine, patient reports no allergy to Tylenol.    ROS Review of Systems  Constitutional:  Negative for activity change and appetite change.  HENT: Negative.    Eyes: Negative.   Respiratory: Negative.    Cardiovascular: Negative.   Gastrointestinal: Negative.   Endocrine: Negative for polydipsia, polyphagia and polyuria.  Genitourinary: Negative.   Neurological: Negative.   Hematological: Negative.   Psychiatric/Behavioral: Negative.       Objective:    Physical Exam Constitutional:      Appearance: Normal appearance.  HENT:     Mouth/Throat:     Mouth: Mucous membranes are moist.  Cardiovascular:     Rate and Rhythm: Normal rate and regular rhythm.     Pulses: Normal pulses.  Pulmonary:     Effort: Pulmonary effort is normal.  Abdominal:     General: Abdomen is flat. Bowel sounds are normal.  Musculoskeletal:        General: Normal range of motion.  Skin:    General: Skin is warm.  Neurological:     General: No focal deficit present.     Mental Status: She is alert. Mental status is at baseline.  Psychiatric:        Mood and Affect: Mood normal.        Behavior: Behavior normal.        Thought Content: Thought content normal.        Judgment: Judgment normal.    BP (!) 191/72   Pulse 70   Temp 97.7 F (36.5 C)   Ht 5' 4"  (1.626 m)   Wt 204 lb (92.5 kg)   SpO2 98%   BMI 35.02 kg/m  Wt Readings from Last 3 Encounters:  12/10/20 204 lb (92.5 kg)  09/26/20 200 lb (90.7 kg)  06/18/20 217 lb (98.4 kg)     Health Maintenance Due  Topic Date Due   Zoster Vaccines- Shingrix (1 of 2) Never done   COLONOSCOPY (Pts 45-28yr Insurance coverage will need to be confirmed)  Never done   DEXA SCAN  Never done   MAMMOGRAM  04/22/2017   COVID-19 Vaccine (3 - Booster for PTuscolaseries) 11/06/2019   OPHTHALMOLOGY EXAM  01/06/2020   LIPID PANEL  06/25/2020   FOOT EXAM   11/27/2020    There are no preventive care reminders to display for this patient.  Lab Results  Component Value Date   TSH 0.838 04/10/2020   Lab Results  Component Value Date   WBC 9.5 09/26/2020   HGB 10.9 (L) 09/26/2020   HCT 35.8 (L) 09/26/2020   MCV 86.5 09/26/2020   PLT 204 09/26/2020   Lab Results  Component Value Date   NA 135 09/26/2020   K 5.0 09/26/2020   CO2 22 09/26/2020   GLUCOSE 85 09/26/2020   BUN 42 (H) 09/26/2020   CREATININE 2.30 (H) 09/26/2020   BILITOT 1.0 09/26/2020   ALKPHOS 160 (H) 09/26/2020   AST 24 09/26/2020   ALT 9 09/26/2020   PROT 6.8 09/26/2020   ALBUMIN 3.6 09/26/2020   CALCIUM 9.2 09/26/2020   ANIONGAP 8 09/26/2020   EGFR 31 (L) 06/18/2020   GFR 63.19 11/07/2014   Lab Results  Component Value Date   CHOL 156 06/26/2019   Lab Results  Component Value Date   HDL 78 06/26/2019   Lab Results  Component Value Date   LDLCALC 68 06/26/2019   Lab Results  Component Value Date   TRIG 43 06/26/2019   Lab Results  Component Value Date   CHOLHDL 2.0 06/26/2019   Lab Results  Component Value Date   HGBA1C 10.6 (A) 12/10/2020   HGBA1C 10.6 12/10/2020   HGBA1C 10.6 (A) 12/10/2020   HGBA1C 10.6 (A) 12/10/2020      Assessment & Plan:   Problem List Items Addressed This Visit       Endocrine   Uncontrolled type 2 diabetes mellitus with hyperglycemia ([place] - [place]   Relevant Medications   Dulaglutide (TRULICITY) 1.5 MGT/3.6IWSOPN   Other Relevant Orders   POCT URINALYSIS DIP (CLINITEK) (Completed)   HgB A1c (Completed)   Basic metabolic panel (Completed)  Other Visit Diagnoses     Influenza vaccine needed       Relevant Orders   Flu Vaccine QUAD 36+ mos IM (Fluarix, Fluzone & Afluria Quad PF (Completed)   Urine white blood cells increased       Relevant Orders   Urine Culture (Completed)      1. Uncontrolled type 2 diabetes mellitus with hyperglycemia (HCC) Hemoglobin A1c is 10.6, which is improved from  previous.  Patient advised to continue low carbohydrate diet divided over small meals throughout the day.  Also, discussed reestablishing care with podiatry, patient expressed understanding.  She is up-to-date with eye exams.  Today, will increase Trulicity for better diabetes control.  No other medication changes warranted.  We will reassess in 3 months. - POCT URINALYSIS DIP (CLINITEK) - HgB Y5K - Basic metabolic panel; Future - Basic metabolic panel - Dulaglutide (TRULICITY) 1.5 PT/4.6FK SOPN; Inject 1.5 mg into the skin once a week.  Dispense: 0.5 mL; Refill: 11  2. Influenza vaccine needed  - Flu Vaccine QUAD 36+ mos IM (Fluarix, Fluzone & Afluria Quad PF  3. Urine white blood cells increased  - Urine Culture  4. Essential hypertension BP (!) 146/80   Pulse 70   Temp 97.7 F (36.5 C)   Ht 5' 4"  (1.626 m)   Wt 204 lb (92.5 kg)   SpO2 98%   BMI 35.02 kg/m  - Continue medication, monitor blood pressure at home. Continue DASH diet.  Reminder to go to the ER if any CP, SOB, nausea, dizziness, severe HA, changes vision/speech, left arm numbness and tingling and jaw pain.   - Basic metabolic panel; Future  5. Hyperlipidemia, unspecified hyperlipidemia type The 10-year ASCVD risk score (Arnett DK, et al., 2019) is: 26.1%   Values used to calculate the score:     Age: 27 years     Sex: Female     Is Non-Hispanic African American: Yes     Diabetic: Yes     Tobacco smoker: No     Systolic Blood Pressure: 812 mmHg     Is BP treated: Yes     HDL Cholesterol: 78 mg/dL     Total Cholesterol: 156 mg/dL   6. Stage 3a chronic kidney disease (HCC)  - POCT URINALYSIS DIP (CLINITEK) - Basic metabolic panel; Future  Follow-up: Return in about 3 months (around 03/12/2021) for diabetes.   Donia Pounds  APRN, MSN, FNP-C [facility] [address] [place], [place] [address] [phone]

## 2020-12-11 LAB — BASIC METABOLIC PANEL
BUN/Creatinine Ratio: 20 (ref 12–28)
BUN: 31 mg/dL — ABNORMAL HIGH (ref 8–27)
CO2: 23 mmol/L (ref 20–29)
Calcium: 9.5 mg/dL (ref 8.7–10.3)
Chloride: 104 mmol/L (ref 96–106)
Creatinine, Ser: 1.52 mg/dL — ABNORMAL HIGH (ref 0.57–1.00)
Glucose: 172 mg/dL — ABNORMAL HIGH (ref 70–99)
Potassium: 5.3 mmol/L — ABNORMAL HIGH (ref 3.5–5.2)
Sodium: 140 mmol/L (ref 134–144)
eGFR: 37 mL/min/{1.73_m2} — ABNORMAL LOW (ref >59)

## 2020-12-13 LAB — URINE CULTURE

## 2020-12-18 ENCOUNTER — Telehealth: Payer: Self-pay | Admitting: Family Medicine

## 2020-12-18 NOTE — Telephone Encounter (Signed)
Rachel Gonzalez is a 70 year old female with a medical history significant for type 2 diabetes mellitus, chronic kidney disease stage III, hypertension, and hyperlipidemia that presented for follow-up of chronic conditions.  Reviewed all laboratory values, creatinine (indicator of renal functioning) continues to be elevated, but much improved from previous.  We will continue tight control of blood pressure and blood glucose.  No further medication changes are warranted at this time.  We will continue to follow closely.  Follow-up in 3 months as scheduled.   Nolon Nations  APRN, MSN, FNP-C Patient Care [facility] [address] [place], [name] [address] [phone]

## 2020-12-23 ENCOUNTER — Telehealth: Payer: Self-pay

## 2020-12-23 ENCOUNTER — Other Ambulatory Visit: Payer: Self-pay

## 2020-12-23 NOTE — Patient Outreach (Addendum)
[facility] Customer service manager [facility]) Care Management  12/23/2020  Rachel Gonzalez 11-17-1950 [id number]  Referral for medication assistance from Fleeta Emmer, RN sent to [facility].  [name] [place] [facility] Management Assistant [phone]

## 2020-12-23 NOTE — Patient Instructions (Signed)
Goals Addressed               This Visit's Progress     Monitor and Manage My Blood Sugar-Diabetes Type 2 (pt-stated)   On track     Timeframe:  Long-Range Goal Priority:  High Start Date:    04/25/20                         Expected End Date:    09/05/21  Follow Up Date 04/08/21  - check blood sugar at prescribed times - take the blood sugar log to all doctor visits encouraged    Why is this important?   Checking your blood sugar at home helps to keep it from getting very high or very low.  Writing the results in a diary or log helps the doctor know how to care for you.  Your blood sugar log should have the time, date and the results.  Also, write down the amount of insulin or other medicine that you take.  Other information, like what you ate, exercise done and how you were feeling, will also be helpful.     Notes: Call to provider office regarding patient need for meter. Reviewed s&s of hypo/hyperglycemia and checking blood sugar. Reinforced checking blood sugar at least 3 times a day, before meals, bedtime. 05/01/20- patient checking sugars twice a day. Encouraged patient to recheck sugars a couple of hours after a low sugar.  05/14/20 patient checking sugars but still dealing with lows.  Lantus recently decreased.    06/10/20 Continue to monitor sugars and place in log for MD review.  Keep up the great work!! 07/11/20 A1c 11.5  Keep up the great work!! 08/08/20 Patient continues to manage diabetes.  Reviewed importance of limiting carbs such as rice, potatoes, breads, and pastas. Also discussed limiting sweets and sugary drinks.  Also discussed importance of annual exams such as mammogram and eye exam.   12/23/20 Patient reports she is doing ok. Blood sugar no higher than 189.  A1c 10.6. Encouraged patient to continue with progress.   Diabetes Management Discussed: Medication adherence Reviewed importance of limiting carbs such as rice, potatoes, breads, and pastas. Also discussed  limiting sweets and sugary drinks.  Discussed importance of portion control.  Also discussed importance of annual exams, foot exams, and eye exams.

## 2020-12-23 NOTE — Patient Outreach (Signed)
[facility]) Care Management  [place]  12/23/2020   Rachel Gonzalez July 02, 1950 [id number]  Subjective: Telephone call to patient for follow up.  Patient doing good but in the doughnut hole for Lantus. Patient agreeable to pharmacy referral for patient assistance.  A1c now 10.6.  Encouraged to continue progress with sugars.  Trulicity increased and requested change to be sent to [facility] as patient is on patient assistance for this.  Advised patient to outreach CM if she does not hear from anybody about her medications. She verbalized understanding.    Objective:   Encounter Medications:  Outpatient Encounter Medications as of 12/23/2020  Medication Sig   Accu-Chek Softclix Lancets lancets Use 1-4 times daily as needed DX E11.9   acetaminophen (TYLENOL) 500 MG tablet Take 500-1,000 mg by mouth every 6 (six) hours as needed (pain.).   amLODipine (NORVASC) 5 MG tablet Take 1 tablet (5 mg total) by mouth daily.   atorvastatin (LIPITOR) 20 MG tablet Take 1 tablet (20 mg total) by mouth daily.   Blood Glucose Monitoring Suppl (ACCU-CHEK GUIDE) w/Device KIT 1 Device by Does not apply route 4 (four) times daily.   cetirizine (ZYRTEC ALLERGY) 10 MG tablet Take 1 tablet (10 mg total) by mouth daily.   Dulaglutide (TRULICITY) 1.5 LD/3.5TS SOPN Inject 1.5 mg into the skin once a week.   furosemide (LASIX) 20 MG tablet Take 1 tablet (20 mg total) by mouth daily.   gabapentin (NEURONTIN) 300 MG capsule Take 1 capsule (300 mg total) by mouth 2 (two) times daily.   glucose blood (ACCU-CHEK GUIDE) test strip Use as instructed   insulin glargine (LANTUS SOLOSTAR) 100 UNIT/ML Solostar Pen Take 15 units in the morning and 10 units at bedtime.   lisinopril (ZESTRIL) 10 MG tablet Take 1 tablet (10 mg total) by mouth daily.   omeprazole (PRILOSEC) 20 MG capsule Take 1 capsule (20 mg total) by mouth daily.   RELION PEN NEEDLE 31G/8MM 31G X 8 MM MISC USE AS DIRECTED 4 TIMES DAILY    Triamcinolone Acetonide (TRIAMCINOLONE 0.1 % CREAM : EUCERIN) CREA Apply 1 application topically 3 (three) times daily as needed. (Patient not taking: No sig reported)   triamcinolone ointment (KENALOG) 0.5 % Apply 1 application topically 2 (two) times daily.   No facility-administered encounter medications on file as of 12/23/2020.    Functional Status:  In your present state of health, do you have any difficulty performing the following activities: 07/11/2020 04/24/2020  Hearing? N N  Vision? N N  Comment has glaucoma has glaucoma  Difficulty concentrating or making decisions? N N  Walking or climbing stairs? [name]  Comment uses cane uses cane  Dressing or bathing? N N  Doing errands, shopping? [name]  Comment husband assist with transportation husband assist with Copywriter, advertising and eating ? Y Y  Comment husband helps husband helps  Using the Toilet? N N  In the past six months, have you accidently leaked urine? N N  Do you have problems with loss of bowel control? N N  Managing your Medications? N N  Managing your Finances? N N  Housekeeping or managing your Housekeeping? Y Y  Comment husband helps husband helps  Some recent data might be hidden    Fall/Depression Screening: Fall Risk  12/10/2020 07/11/2020 05/07/2020  Falls in the past year? 1 1 1   Number falls in past yr: 0 1 -  Injury with Fall? 0 1 -  Risk for  fall due to : - History of fall(s) -  Follow up - Falls prevention discussed;Education provided Falls evaluation completed   PHQ 2/9 Scores 12/10/2020 07/11/2020 05/07/2020 05/01/2020 08/29/2019 05/23/2019 08/31/2018  PHQ - 2 Score 0 0 0 0 0 0 0    Assessment:   Care Plan Care Plan : Diabetes Type 2 (Adult)  Updates made by Jon Billings, RN since 12/23/2020 12:00 AM     Problem: Disease Progression (Diabetes, Type 2)   Priority: Medium  Onset Date: 04/25/2020     Long-Range Goal: Disease Progression Prevented or Minimized as evidenced by less reports of low  blood sugar.   Start Date: 04/25/2020  Expected End Date: 09/05/2021  This Visit's Progress: On track  Recent Progress: On track  Priority: High  Note:   Barriers: Knowledge Evidence-based guidance:    Encourage lifestyle changes, such as increased intake of plant-based foods, stress reduction, consistent physical activity and smoking cessation to prevent long-term complications and chronic disease.   Individualize activity and exercise recommendations while considering potential limitations, such as neuropathy, retinopathy or the ability to prevent hyperglycemia or hypoglycemia.   Notes: 05/14/20 patient still having low blood sugars. Endocrinology referral done by physician and lantus decreased.  06/10/20 Patient reports only maybe a couple of lows recently.  She reports management of her diabetes is getting better. 07/11/20 Patient A1c 11.5.  Patient reports only a couple of lows.   08/08/20 This mornings blood sugar 189. Patient continues to work on control of sugars.      Task: Monitor and Manage Follow-up for Comorbidities   Due Date: 09/05/2021  Priority: Routine  Responsible User: Jon Billings, RN  Note:   Care Management Activities:    - healthy lifestyle promoted - reduction of sedentary activity encouraged - response to pharmacologic therapy monitored  Reinforced continued follow up routine maintenance care  with foot exams and eye exam appointment.    Notes: Reviewed recent eye exam and podiatry visit  12/23/20 Patient reports she is still making progress with her diabetes.  A1c now 10.6 after being greater than 15.5.  Patient on trulicity and lantus insulin.  In doughnut hole for lantus.  Will do pharmacy referral. Trulicity increased but physician sent to wrong pharmacy. Called to MD to have sent to [facility] as patient is with patient assistance program.  Reiterated diabetes management.        Goals Addressed               This Visit's Progress     Monitor and  Manage My Blood Sugar-Diabetes Type 2 (pt-stated)   On track     Timeframe:  Long-Range Goal Priority:  High Start Date:    04/25/20                         Expected End Date:    09/05/21  Follow Up Date 04/08/21  - check blood sugar at prescribed times - take the blood sugar log to all doctor visits encouraged    Why is this important?   Checking your blood sugar at home helps to keep it from getting very high or very low.  Writing the results in a diary or log helps the doctor know how to care for you.  Your blood sugar log should have the time, date and the results.  Also, write down the amount of insulin or other medicine that you take.  Other information, like what you ate, exercise done  and how you were feeling, will also be helpful.     Notes: Call to provider office regarding patient need for meter. Reviewed s&s of hypo/hyperglycemia and checking blood sugar. Reinforced checking blood sugar at least 3 times a day, before meals, bedtime. 05/01/20- patient checking sugars twice a day. Encouraged patient to recheck sugars a couple of hours after a low sugar.  05/14/20 patient checking sugars but still dealing with lows.  Lantus recently decreased.    06/10/20 Continue to monitor sugars and place in log for MD review.  Keep up the great work!! 07/11/20 A1c 11.5  Keep up the great work!! 08/08/20 Patient continues to manage diabetes.  Reviewed importance of limiting carbs such as rice, potatoes, breads, and pastas. Also discussed limiting sweets and sugary drinks.  Also discussed importance of annual exams such as mammogram and eye exam.   12/23/20 Patient reports she is doing ok. Blood sugar no higher than 189.  A1c 10.6. Encouraged patient to continue with progress.   Diabetes Management Discussed: Medication adherence Reviewed importance of limiting carbs such as rice, potatoes, breads, and pastas. Also discussed limiting sweets and sugary drinks.  Discussed importance of portion control.  Also  discussed importance of annual exams, foot exams, and eye exams.            Plan:  Follow-up: Patient agrees to Care Plan and Follow-up. Follow-up in 3 month(s)   [name], RN, MSN [place] Management Care Management Coordinator Direct Line [phone] Cell [phone] Toll Free: [phone]  Fax: [phone]

## 2020-12-23 NOTE — Telephone Encounter (Signed)
Care management just called about  Trulicity and it needs to be sent [facility]  Fax [phone]

## 2020-12-24 ENCOUNTER — Telehealth: Payer: Self-pay | Admitting: Pharmacist

## 2020-12-24 ENCOUNTER — Ambulatory Visit: Payer: Self-pay

## 2020-12-24 ENCOUNTER — Other Ambulatory Visit: Payer: Self-pay | Admitting: Family Medicine

## 2020-12-24 NOTE — Progress Notes (Signed)
Rachel Gonzalez [facility])  [place] Team    12/24/2020  Rachel Gonzalez December 29, 1950 [id number]  Reason for referral: Medication Assistance with Lantus  Referral source: [place] RN Current insurance: Humana  Outreach:  Successful telephone call with [name] HIPAA identifiers verified.   Objective: The 10-year ASCVD risk score (Arnett DK, et al., 2019) is: 26.1%   Values used to calculate the score:     Age: 70 years     Sex: Female     Is Non-Hispanic African American: Yes     Diabetic: Yes     Tobacco smoker: No     Systolic Blood Pressure: 258 mmHg     Is BP treated: Yes     HDL Cholesterol: 78 mg/dL     Total Cholesterol: 156 mg/dL  Lab Results  Component Value Date   CREATININE 1.52 (H) 12/10/2020   CREATININE 2.30 (H) 09/26/2020   CREATININE 1.77 (H) 06/18/2020    Lab Results  Component Value Date   HGBA1C 10.6 (A) 12/10/2020   HGBA1C 10.6 12/10/2020   HGBA1C 10.6 (A) 12/10/2020   HGBA1C 10.6 (A) 12/10/2020    Lipid Panel     Component Value Date/Time   CHOL 156 06/26/2019 0924   TRIG 43 06/26/2019 0924   HDL 78 06/26/2019 0924   CHOLHDL 2.0 06/26/2019 0924   CHOLHDL 2.1 11/30/2016 1130   VLDL 13 09/23/2015 0900   LDLCALC 68 06/26/2019 0924   LDLCALC 48 11/30/2016 1130   LDLDIRECT 74 08/10/2007 2045    BP Readings from Last 3 Encounters:  12/10/20 (!) 146/80  09/27/20 (!) 156/75  09/26/20 103/63    Allergies  Allergen Reactions   Tramadol Nausea And Vomiting   Adhesive [Tape] Itching   Bactrim [Sulfamethoxazole-Trimethoprim] Other (See Comments)    AKI and hyperkalemia    Tylenol With Codeine #3 [Acetaminophen-Codeine] Diarrhea and Itching    Only allergic to Codeine, patient reports no allergy to Tylenol.      Medication Review Findings:  Confirmed with patient that she is injecting Lantus 15 units in the AM and 10 units HS subcutaneously daily along with Trulicity 1.5 mg subcutaneously weekly.  Noted that  Trulicity was recently increased from Trulicity 5.27 mg to 1.5 mg subcutaneous once weekly. New prescription will need to be issued to [place], patient assistance company supplying patient with Trulicity.  Patient reports that she has 3 pens of Lantus on hand at this time.  Patient inquires about the [facility] OTC catalogue for her quarterly OTC benefit. Informed patient that she can call the number on the back of her card. Patient requests that I place the call for the catalogue on her behalf. Informed patient that I would attempt to make the call on her behalf, but may need to call her back and plae a three way call in order to get the catalogue mailed. Patient verbalized understanding.    Medication Assistance Findings:  Medication assistance needs identified: Lantus and re-enrollment for Trulicity for the 7824 year.   Patient Assistance Programs: Lantus  made by [employer] requirement met: Yes Out-of-pocket prescription expenditure met:   Not Applicable Patient has met application requirements to apply for this program.    Trulicity  made by [employer] requirement met: Yes Out-of-pocket prescription expenditure met:   Not Applicable Patient has met application requirements to apply for this program.    Additional medication assistance options reviewed with patient as warranted:  Insurance OTC catalogue  Plan: I will  route patient assistance letter to [facility] pharmacy technician who will coordinate patient assistance program application process for medications listed above.  [facility] pharmacy technician will assist with obtaining all required documents from both patient and provider(s) and submit application(s) once completed.  Made patient aware that we will need a copy of her [place] Letter. Patient reports that her current [facility] card was last issued in 2021. This copy should satisfy the required documentation for [employer] for enrollment.  Message to PCP to  request that a prescription for new dose of Trulicity be sent to the following with indications  "DOSE CHANGE FOR ENROLLED PATIENT" for Trulicity 1.5 mg Inject 1.5 mg into the skin once a week to both:  [facility]: [phone] and [facility]: [phone] Or if more convenient, e-scribe to [facility], [place], [place], PharmD [place] Pharmacist Direct Dial: [phone]

## 2020-12-25 ENCOUNTER — Other Ambulatory Visit: Payer: Self-pay | Admitting: Family Medicine

## 2020-12-25 DIAGNOSIS — E1165 Type 2 diabetes mellitus with hyperglycemia: Secondary | ICD-10-CM

## 2020-12-25 MED ORDER — TRULICITY 1.5 MG/0.5ML ~~LOC~~ SOAJ: 1.5000 mg | SUBCUTANEOUS | 11 refills | Status: DC

## 2020-12-26 ENCOUNTER — Other Ambulatory Visit: Payer: Self-pay | Admitting: Family Medicine

## 2020-12-26 NOTE — Progress Notes (Signed)
Opened in error

## 2020-12-27 ENCOUNTER — Telehealth: Payer: Self-pay | Admitting: Pharmacist

## 2020-12-27 NOTE — Progress Notes (Signed)
[facility])  [place] Team    12/27/2020  Rachel Gonzalez Aug 05, 1950 [id number]  Reason for referral: Medication Assistance  Referral source: [facility] RN Current insurance: Humana   Outreach:  Unsuccessful  telephone outreach. Left HIPAA compliant voice mail requesting a call back.   Patient Assistance Programs: Engineer, agricultural  made by [employer] requirement met: Yes Out-of-pocket prescription expenditure met:   Not Applicable Patient has met application requirements to apply for this program.    Plan: Placed call to [facility] and requested that a new OTC catalogue be mailed to the patient. Representative states it will take 10-14 business days for the patient to receive the catalogue. Will attempt to outreach patient again in 1 to 3 business days if I do not hear back from patient to update her on the medication change from Lantus to [employer] through the [employer] patient assistance program.    Loretha Brasil, PharmD [place] Pharmacist Direct Dial: [phone]

## 2020-12-31 NOTE — Progress Notes (Signed)
Second attempt to outreach patient. Left VM to return my call.

## 2021-01-08 NOTE — Progress Notes (Signed)
Placed a call to [facility] to follow up on the status of the order for the Basaglar. [facility] did not have the prescription as of today, 01/08/2021. Will send message to provider to request the fax be sent again.   Reynold Bowen, PharmD [facility]  [facility] Clinical Pharmacist Office: [phone]

## 2021-01-08 NOTE — Progress Notes (Addendum)
I was able to reach Mrs. Woody Seller via telephone. HIPAA identifiers were verified with patient. I was able to make Mrs. Mordan aware that I have been in contact with Julianne Handler, FNP to change her Lantus to [employer] from [employer]. I made patient aware that she may continue the Basaglar at the same dose as the Lantus. Patient verbalized understanding. I also notified patient that per [facility], her OTC catalogue should be arriving to her mailing address within 10 to 15 business days. Reconfirmed mailing address in [facility] with patient. Patient verbalized understanding and thanked me for my call.   Reynold Bowen, PharmD [facility]  [facility] Clinical Pharmacist Office: [phone]

## 2021-01-08 NOTE — Progress Notes (Addendum)
Third attempt. Reached patient, she is not able to take my call and requests a call back in 1 hour.

## 2021-01-10 ENCOUNTER — Other Ambulatory Visit: Payer: Self-pay | Admitting: Family Medicine

## 2021-01-10 DIAGNOSIS — Z9109 Other allergy status, other than to drugs and biological substances: Secondary | ICD-10-CM

## 2021-01-10 DIAGNOSIS — L304 Erythema intertrigo: Secondary | ICD-10-CM

## 2021-01-14 ENCOUNTER — Other Ambulatory Visit: Payer: Self-pay | Admitting: Family Medicine

## 2021-01-14 DIAGNOSIS — E1165 Type 2 diabetes mellitus with hyperglycemia: Secondary | ICD-10-CM

## 2021-01-14 MED ORDER — TRULICITY 1.5 MG/0.5ML ~~LOC~~ SOAJ: 1.5000 mg | SUBCUTANEOUS | 11 refills | Status: DC

## 2021-01-15 ENCOUNTER — Telehealth: Payer: Self-pay | Admitting: Pharmacy Technician

## 2021-01-15 DIAGNOSIS — Z596 Low income: Secondary | ICD-10-CM

## 2021-01-15 NOTE — Progress Notes (Signed)
[facility] Customer service [facility])                                            [facility] Quality Pharmacy Team    01/15/2021  CHELCY BOLDA 1950/08/21 [id number]   Received in basket message from [facility] Pharmacist Tiffany Benfield in regards to patient's enrollment with [employer] patient assistance foundation.  Patient was approved in May for [facility] patient assistance. Since then patient's Trulicity dose has been increased and patient also reported that her Lantus was cost prohibitive. Reynold Bowen, PharmD outreached provider Julianne Handler, NP and was able to get the Lantus changed to Basaglar sinec patient already approved for the program. Therefore, Lilly provider portions were faxed to Julianne Handler NP for the [place] and the increased Trulicity dose. Once received back from provider will submit to [facility] for processing and shipping.                                      Medication Assistance Referral  Referral From: [facility] RPh Tiffany B.  Medication/Company: [name] Patient application portion:  N/A already approved with Lilly since May 2022 and enrolmment ends 03/08/21. Provider application portion: Faxed  to Julianne Handler, NP Provider address/fax verified via: Office website   [place] P. Robbie Rideaux, CPhT [facility]  [phone]

## 2021-01-15 NOTE — Progress Notes (Signed)
[facility] Customer service manager [facility])                                            [facility] Quality Pharmacy Team    01/15/2021  Rachel Gonzalez 03-02-1951 [id number]  FOR 2023 RE ENROLLMENT                                      Medication Assistance Referral  Referral From: [facility] RPh Tiffany B.  Medication/Company: [name] Patient application portion:  Mailed Provider application portion: Faxed  to Julianne Handler, FNP Provider address/fax verified via: Office website  Medication/Company: [name] Patient application portion:  Mailed Provider application portion: Faxed  to Julianne Handler, FNP Provider address/fax verified via: Office website    Edin Skarda P. Ardon Franklin, CPhT [facility] [employer]  [phone]

## 2021-01-23 ENCOUNTER — Encounter (HOSPITAL_COMMUNITY): Payer: Self-pay

## 2021-01-23 ENCOUNTER — Other Ambulatory Visit: Payer: Self-pay

## 2021-01-23 ENCOUNTER — Ambulatory Visit (HOSPITAL_COMMUNITY)
Admission: EM | Admit: 2021-01-23 | Discharge: 2021-01-23 | Disposition: A | Discharge status: Home / Self Care | Payer: Medicare HMO | Attending: Internal Medicine | Admitting: Internal Medicine

## 2021-01-23 DIAGNOSIS — M25512 Pain in left shoulder: Secondary | ICD-10-CM | POA: Diagnosis not present

## 2021-01-23 MED ORDER — METHOCARBAMOL 500 MG PO TABS: 500.0000 mg | ORAL_TABLET | Freq: Every evening | ORAL | 0 refills | Status: DC | PRN

## 2021-01-23 MED ORDER — IBUPROFEN 600 MG PO TABS: 600.0000 mg | ORAL_TABLET | Freq: Four times a day (QID) | ORAL | 0 refills | Status: DC | PRN
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
## 2021-01-23 NOTE — Discharge Instructions (Addendum)
Heating pad use on a 20-minute on-20 minutes off cycle 2 hours a day will help with symptoms Take medications as prescribed Do not drive or operate heavy machinery after taking muscle relaxants because muscle relaxants makes you drowsy Gentle stretching and range of motion exercises Return to urgent care if symptoms worsen.

## 2021-01-24 ENCOUNTER — Telehealth: Payer: Self-pay | Admitting: Pharmacy Technician

## 2021-01-24 DIAGNOSIS — Z596 Low income: Secondary | ICD-10-CM

## 2021-01-24 NOTE — Progress Notes (Signed)
[facility])                                            [facility] Quality Pharmacy Team    01/24/2021  Rachel Gonzalez 03-Dec-1950 [id number]  For 2022 patient assistance, Care coordination call placed to Lilly in regards to new medication added to current enrollment Basaglar as well as dose increase of Trulicity which patient was approved for back in May.  Spoke to [facility] who informed they received the Basaglar prescription and patient is enrolled to receive that medication. She informed it was sent over to the pharmacy today. She informed it was sent to [facility] specialty pharmacy and they would be outreaching patient in 24-48 business hours to set up shipment. IF patient has not heard from them by Wednesday 01/29/21 then she can call them at [phone].  [facility] also informed they received the Trulicity dose increase and she would sent that to the pharmacy today so patient can receive the dose increase with the current enrollment. She informs that is going to be coming from their previous pharmacy which [facility]. They will be outreaching patient as well in 24-48 business hours to set up shipment. IF she has not heard from them by Wednesday 01/29/21 then she can call them at [phone] to set up shipment of that medication.  Unsuccessful outreach call placed to patient. HIPAA compliant voicemail left to update patient on the above information and to provide patient the phone numbers to call.  Ryken Paschal P. Rogelio Waynick, CPhT [facility]  [phone]
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
## 2021-01-27 ENCOUNTER — Encounter: Payer: Self-pay | Admitting: Nurse Practitioner

## 2021-01-27 ENCOUNTER — Other Ambulatory Visit: Payer: Self-pay

## 2021-01-27 ENCOUNTER — Ambulatory Visit (INDEPENDENT_AMBULATORY_CARE_PROVIDER_SITE_OTHER): Payer: Medicare HMO | Admitting: Nurse Practitioner

## 2021-01-27 VITALS — BP 146/56 | HR 75 | Temp 97.1°F | Ht 64.0 in | Wt 201.0 lb

## 2021-01-27 DIAGNOSIS — M25512 Pain in left shoulder: Secondary | ICD-10-CM | POA: Diagnosis not present

## 2021-01-27 MED ORDER — LIDOCAINE 4 % EX PTCH: 1.0000 | MEDICATED_PATCH | Freq: Every day | CUTANEOUS | 0 refills | Status: DC

## 2021-01-27 NOTE — Patient Instructions (Signed)
You were seen today in the [facility] for reevaluation of left shoulder pain . You were prescribed medications, please take as directed. Please follow up in 2 mths for reevaluation of left shoulder pain.
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
## 2021-01-29 DIAGNOSIS — R4182 Altered mental status, unspecified: Secondary | ICD-10-CM | POA: Diagnosis not present

## 2021-01-29 DIAGNOSIS — E870 Hyperosmolality and hypernatremia: Secondary | ICD-10-CM | POA: Diagnosis not present

## 2021-01-29 DIAGNOSIS — N189 Chronic kidney disease, unspecified: Secondary | ICD-10-CM | POA: Diagnosis not present

## 2021-01-29 DIAGNOSIS — R4701 Aphasia: Secondary | ICD-10-CM | POA: Diagnosis not present

## 2021-01-29 DIAGNOSIS — Z8673 Personal history of transient ischemic attack (TIA), and cerebral infarction without residual deficits: Secondary | ICD-10-CM | POA: Diagnosis not present

## 2021-01-29 DIAGNOSIS — R410 Disorientation, unspecified: Secondary | ICD-10-CM | POA: Diagnosis not present

## 2021-01-29 DIAGNOSIS — K219 Gastro-esophageal reflux disease without esophagitis: Secondary | ICD-10-CM | POA: Diagnosis not present

## 2021-01-29 DIAGNOSIS — E1069 Type 1 diabetes mellitus with other specified complication: Secondary | ICD-10-CM | POA: Diagnosis not present

## 2021-01-29 DIAGNOSIS — E1110 Type 2 diabetes mellitus with ketoacidosis without coma: Secondary | ICD-10-CM | POA: Diagnosis not present

## 2021-01-29 DIAGNOSIS — R9431 Abnormal electrocardiogram [ECG] [EKG]: Secondary | ICD-10-CM | POA: Diagnosis not present

## 2021-01-29 DIAGNOSIS — E871 Hypo-osmolality and hyponatremia: Secondary | ICD-10-CM | POA: Diagnosis not present

## 2021-01-29 DIAGNOSIS — I129 Hypertensive chronic kidney disease with stage 1 through stage 4 chronic kidney disease, or unspecified chronic kidney disease: Secondary | ICD-10-CM | POA: Diagnosis not present

## 2021-01-29 DIAGNOSIS — G9341 Metabolic encephalopathy: Secondary | ICD-10-CM | POA: Diagnosis not present

## 2021-01-29 DIAGNOSIS — E1100 Type 2 diabetes mellitus with hyperosmolarity without nonketotic hyperglycemic-hyperosmolar coma (NKHHC): Secondary | ICD-10-CM | POA: Diagnosis not present

## 2021-01-29 DIAGNOSIS — E1022 Type 1 diabetes mellitus with diabetic chronic kidney disease: Secondary | ICD-10-CM | POA: Diagnosis not present

## 2021-01-29 DIAGNOSIS — G9340 Encephalopathy, unspecified: Secondary | ICD-10-CM | POA: Diagnosis not present

## 2021-01-29 DIAGNOSIS — R739 Hyperglycemia, unspecified: Secondary | ICD-10-CM | POA: Diagnosis not present

## 2021-01-29 DIAGNOSIS — E1122 Type 2 diabetes mellitus with diabetic chronic kidney disease: Secondary | ICD-10-CM | POA: Diagnosis not present

## 2021-01-29 DIAGNOSIS — E1165 Type 2 diabetes mellitus with hyperglycemia: Secondary | ICD-10-CM | POA: Diagnosis not present

## 2021-01-29 DIAGNOSIS — D696 Thrombocytopenia, unspecified: Secondary | ICD-10-CM | POA: Diagnosis not present

## 2021-01-29 DIAGNOSIS — E1065 Type 1 diabetes mellitus with hyperglycemia: Secondary | ICD-10-CM | POA: Diagnosis not present

## 2021-01-29 DIAGNOSIS — N183 Chronic kidney disease, stage 3 unspecified: Secondary | ICD-10-CM | POA: Diagnosis not present

## 2021-01-29 DIAGNOSIS — G928 Other toxic encephalopathy: Secondary | ICD-10-CM | POA: Diagnosis not present

## 2021-02-04 ENCOUNTER — Telehealth: Payer: Self-pay | Admitting: Pharmacy Technician

## 2021-02-04 DIAGNOSIS — Z596 Low income: Secondary | ICD-10-CM

## 2021-02-04 NOTE — Progress Notes (Signed)
[facility])                                            [facility] Quality Pharmacy Team    02/04/2021  Rachel Gonzalez 1950/12/13 [id number]  FOR 2023 RE ENROLLMENT  Received both patient and provider portion(s) of patient assistance application(s) for Trulicity and Basaglar for 2023 re enrollment. Faxed completed application and required documents into [employer] for 2023 re enrollment.[name] [name], CPhT [facility] [employer]  [phone]

## 2021-02-20 ENCOUNTER — Other Ambulatory Visit: Payer: Self-pay | Admitting: Family Medicine

## 2021-02-20 DIAGNOSIS — E1140 Type 2 diabetes mellitus with diabetic neuropathy, unspecified: Secondary | ICD-10-CM

## 2021-02-21 ENCOUNTER — Telehealth: Payer: Self-pay | Admitting: Pharmacy Technician

## 2021-02-21 DIAGNOSIS — Z596 Low income: Secondary | ICD-10-CM

## 2021-02-21 NOTE — Progress Notes (Signed)
[facility] Customer service manager [facility])                                            [facility] Quality Pharmacy Team    02/21/2021  MADELYN TLATELPA 1950/08/10 [id number]  Care coordination placed to [employer] in regard to Trulicity and Basaglar application for 2023.  Spoke to [place] who informs patient is approved 03/09/2021-03/08/2022. Refills will be shipped based on last fill dates in 2022.  Elverda Wendel P. Shizuko Wojdyla, CPhT [facility] [employer]  [phone]

## 2021-03-12 ENCOUNTER — Other Ambulatory Visit: Payer: Self-pay | Admitting: Family Medicine

## 2021-03-12 DIAGNOSIS — L304 Erythema intertrigo: Secondary | ICD-10-CM

## 2021-03-12 DIAGNOSIS — Z9109 Other allergy status, other than to drugs and biological substances: Secondary | ICD-10-CM

## 2021-03-18 ENCOUNTER — Ambulatory Visit: Payer: Medicare HMO | Admitting: Family Medicine

## 2021-03-20 ENCOUNTER — Other Ambulatory Visit: Payer: Self-pay

## 2021-03-20 NOTE — Patient Outreach (Signed)
[facility]) Care Management  03/20/2021  ICELYNN ONKEN Jul 31, 1950 [id number]   Telephone Assessment   Unsuccessful outreach attempt patient. No answer after multiple rings and unable to leave message.      Plan: Assigned RN CM will make outreach attempt to patient within the month of Feb if no return call.   Antionette Fairy, RN,BSN,CCM [facility] Care Management Telephonic Care Management Coordinator Direct Phone: [phone] Toll Free: [phone] Fax: [phone]

## 2021-03-24 ENCOUNTER — Ambulatory Visit: Payer: Self-pay

## 2021-04-08 ENCOUNTER — Ambulatory Visit: Payer: Medicare HMO | Admitting: Family Medicine

## 2021-04-24 ENCOUNTER — Other Ambulatory Visit: Payer: Self-pay

## 2021-04-24 NOTE — Patient Outreach (Signed)
[facility]) Care Management  04/24/2021  Rachel Gonzalez 03/14/1950 [id number]   Telephone Assessment   Unsuccessful outreach attempt to patient. No answer after multiple rings and unable to leave message.      Plan: Assigned RN CM will make outreach attempt to patient within the month of April.   Antionette Fairy, RN,BSN,CCM [facility] Care Management Telephonic Care Management Coordinator Direct Phone: [phone] Toll Free: [phone] Fax: [phone]

## 2021-04-28 ENCOUNTER — Ambulatory Visit: Payer: Medicare HMO

## 2021-04-29 ENCOUNTER — Other Ambulatory Visit: Payer: Self-pay | Admitting: Family Medicine

## 2021-04-29 DIAGNOSIS — I10 Essential (primary) hypertension: Secondary | ICD-10-CM

## 2021-05-16 ENCOUNTER — Other Ambulatory Visit: Payer: Self-pay | Admitting: Family Medicine

## 2021-05-16 DIAGNOSIS — I10 Essential (primary) hypertension: Secondary | ICD-10-CM

## 2021-06-10 ENCOUNTER — Other Ambulatory Visit: Payer: Self-pay | Admitting: Family Medicine

## 2021-06-10 DIAGNOSIS — Z9109 Other allergy status, other than to drugs and biological substances: Secondary | ICD-10-CM

## 2021-06-10 DIAGNOSIS — L304 Erythema intertrigo: Secondary | ICD-10-CM

## 2021-06-10 DIAGNOSIS — E1140 Type 2 diabetes mellitus with diabetic neuropathy, unspecified: Secondary | ICD-10-CM

## 2021-06-10 DIAGNOSIS — I10 Essential (primary) hypertension: Secondary | ICD-10-CM

## 2021-06-18 ENCOUNTER — Other Ambulatory Visit: Payer: Self-pay

## 2021-06-18 NOTE — Patient Outreach (Addendum)
[facility] service [facility]) Care Management ? ?06/18/2021 ? ?Lenon Ahmadi ?[birth year] ?[id number] ? ? ?Telephone call to patient for disease management follow up.   No answer.  Unable to leave a message,VM full.     ? ?Plan: If no return call, RN CM will attempt patient again in July.   ? ?[name], RN, MSN ?[facility] Care Management ?Care Management Coordinator ?Direct Line [phone] ?Toll Free: [phone]  ?Fax: [phone] ? ?

## 2021-07-01 ENCOUNTER — Encounter: Payer: Self-pay | Admitting: Family Medicine

## 2021-07-01 ENCOUNTER — Ambulatory Visit (INDEPENDENT_AMBULATORY_CARE_PROVIDER_SITE_OTHER): Payer: Medicare HMO | Admitting: Family Medicine

## 2021-07-01 VITALS — BP 170/71 | HR 68 | Temp 98.4°F | Ht 64.0 in | Wt 206.0 lb

## 2021-07-01 DIAGNOSIS — I10 Essential (primary) hypertension: Secondary | ICD-10-CM | POA: Diagnosis not present

## 2021-07-01 DIAGNOSIS — M170 Bilateral primary osteoarthritis of knee: Secondary | ICD-10-CM

## 2021-07-01 DIAGNOSIS — E1165 Type 2 diabetes mellitus with hyperglycemia: Secondary | ICD-10-CM

## 2021-07-01 DIAGNOSIS — Z23 Encounter for immunization: Secondary | ICD-10-CM | POA: Diagnosis not present

## 2021-07-01 DIAGNOSIS — E785 Hyperlipidemia, unspecified: Secondary | ICD-10-CM

## 2021-07-01 DIAGNOSIS — I130 Hypertensive heart and chronic kidney disease with heart failure and stage 1 through stage 4 chronic kidney disease, or unspecified chronic kidney disease: Secondary | ICD-10-CM | POA: Diagnosis not present

## 2021-07-01 DIAGNOSIS — Z89411 Acquired absence of right great toe: Secondary | ICD-10-CM | POA: Diagnosis not present

## 2021-07-01 DIAGNOSIS — I509 Heart failure, unspecified: Secondary | ICD-10-CM | POA: Diagnosis not present

## 2021-07-01 LAB — POCT GLYCOSYLATED HEMOGLOBIN (HGB A1C)
HbA1c POC (<> result, manual entry): 13.1 % (ref 4.0–5.6)
HbA1c, POC (controlled diabetic range): 13.1 % — AB (ref 0.0–7.0)
HbA1c, POC (prediabetic range): 13.1 % — AB (ref 5.7–6.4)
Hemoglobin A1C: 13.1 % — AB (ref 4.0–5.6)

## 2021-07-01 LAB — POCT URINALYSIS DIP (CLINITEK)
Bilirubin, UA: NEGATIVE
Glucose, UA: NEGATIVE mg/dL
Ketones, POC UA: NEGATIVE mg/dL
Nitrite, UA: NEGATIVE
POC PROTEIN,UA: 30 — AB
Spec Grav, UA: 1.025 (ref 1.010–1.025)
Urobilinogen, UA: 0.2 E.U./dL
pH, UA: 5.5 (ref 5.0–8.0)

## 2021-07-01 MED ORDER — DICLOFENAC SODIUM 1 % EX GEL: 4.0000 g | Freq: Four times a day (QID) | CUTANEOUS | 2 refills | Status: DC

## 2021-07-01 MED ORDER — AMLODIPINE BESYLATE 10 MG PO TABS: 10.0000 mg | ORAL_TABLET | Freq: Every day | ORAL | 1 refills | Status: DC

## 2021-07-01 MED ORDER — AMLODIPINE BESYLATE 10 MG PO TABS: 5.0000 mg | ORAL_TABLET | Freq: Every day | ORAL | 1 refills | Status: DC

## 2021-07-01 NOTE — Patient Instructions (Signed)
Increased amlodipine to 10 mg for greater blood pressure control ? ?

## 2021-07-02 DIAGNOSIS — E1165 Type 2 diabetes mellitus with hyperglycemia: Secondary | ICD-10-CM | POA: Diagnosis not present

## 2021-07-02 LAB — COMPREHENSIVE METABOLIC PANEL
ALT: 12 IU/L (ref 0–32)
AST: 10 IU/L (ref 0–40)
Albumin/Globulin Ratio: 1.4 (ref 1.2–2.2)
Albumin: 3.9 g/dL (ref 3.8–4.8)
Alkaline Phosphatase: 230 IU/L — ABNORMAL HIGH (ref 44–121)
BUN/Creatinine Ratio: 16 (ref 12–28)
BUN: 27 mg/dL (ref 8–27)
Bilirubin Total: 0.2 mg/dL (ref 0.0–1.2)
CO2: 22 mmol/L (ref 20–29)
Calcium: 9.6 mg/dL (ref 8.7–10.3)
Chloride: 106 mmol/L (ref 96–106)
Creatinine, Ser: 1.68 mg/dL — ABNORMAL HIGH (ref 0.57–1.00)
Globulin, Total: 2.8 g/dL (ref 1.5–4.5)
Glucose: 190 mg/dL — ABNORMAL HIGH (ref 70–99)
Potassium: 5.6 mmol/L — ABNORMAL HIGH (ref 3.5–5.2)
Sodium: 143 mmol/L (ref 134–144)
Total Protein: 6.7 g/dL (ref 6.0–8.5)
eGFR: 33 mL/min/{1.73_m2} — ABNORMAL LOW (ref >59)

## 2021-07-04 LAB — URINE CULTURE

## 2021-07-09 ENCOUNTER — Other Ambulatory Visit: Payer: Self-pay | Admitting: Family Medicine

## 2021-07-09 DIAGNOSIS — Z9109 Other allergy status, other than to drugs and biological substances: Secondary | ICD-10-CM

## 2021-07-09 DIAGNOSIS — L304 Erythema intertrigo: Secondary | ICD-10-CM

## 2021-07-15 ENCOUNTER — Other Ambulatory Visit: Payer: Self-pay

## 2021-07-15 DIAGNOSIS — K219 Gastro-esophageal reflux disease without esophagitis: Secondary | ICD-10-CM

## 2021-07-15 MED ORDER — OMEPRAZOLE 20 MG PO CPDR: 20.0000 mg | DELAYED_RELEASE_CAPSULE | Freq: Every day | ORAL | 0 refills | Status: DC

## 2021-07-17 NOTE — Progress Notes (Signed)
? ?Patient [place] ?Internal Medicine and Sickle Cell Care  ?Established Patient Office Visit ? ?Subjective   ?Patient ID: Rachel Gonzalez, female    DOB: [birth year]  Age: 71 y.o. MRN: [id number] ? ?Chief Complaint  ?Patient presents with  ? Follow-up  ?  Pt is here for 6 month follow up visits. Pt stated both legs hurts a lot of cramps  ? ? ?Rachel Gonzalez is a very pleasant 71 year old female with a medical history significant for type 2 diabetes mellitus, hypertension, hyperlipidemia, and CKD 2 presents for a follow-up of chronic conditions. Patient has been lost to follow up over the past several months. Patient says that her husband recently received a poor medical diagnosis and she has had many stressors at home.  ?She has not been checking blood sugar consistently or following a carbohydrate modified diet.  ? ?Diabetes ?She presents for her follow-up diabetic visit. She has type 2 diabetes mellitus. Her disease course has been stable. There are no hypoglycemic associated symptoms. Pertinent negatives for hypoglycemia include no sweats. Associated symptoms include polydipsia, polyphagia and polyuria. Pertinent negatives for diabetes include no blurred vision, no chest pain, no fatigue, no foot paresthesias, no foot ulcerations, no visual change, no weakness and no weight loss. Pertinent negatives for hypoglycemia complications include no blackouts and no hospitalization. Symptoms are stable. Pertinent negatives for diabetic complications include no retinopathy. Risk factors for coronary artery disease include diabetes mellitus, dyslipidemia and sedentary lifestyle. Current diabetic treatment includes diet, insulin injections and oral agent (monotherapy). She is compliant with treatment some of the time. She sees a podiatrist (Patient has been lost to follow up with podiatry).Eye exam is current.  ?Hypertension ?This is a chronic problem. The problem is controlled. Pertinent negatives include no anxiety,  blurred vision, chest pain, malaise/fatigue, neck pain, orthopnea, palpitations, peripheral edema, shortness of breath or sweats. There are no known risk factors for coronary artery disease. Compliance problems include diet.  Hypertensive end-organ damage includes kidney disease. There is no history of heart failure or retinopathy.  ? ?Patient Active Problem List  ? Diagnosis Date Noted  ? Acute metabolic encephalopathy 99991111  ? Acute lower UTI 04/11/2020  ? DKA (diabetic ketoacidosis) ([place]) 04/10/2020  ? Altered mental status 04/10/2020  ? CAP (community acquired pneumonia) 04/10/2020  ? Pyuria 04/10/2020  ? Stage 3 chronic kidney disease ([place]) 12/01/2016  ? Frequent falls 04/28/2016  ? Right arm weakness 04/28/2016  ? Bilateral lower extremity edema 04/28/2016  ? Screening for colon cancer 04/28/2016  ? Abnormal urinalysis 09/13/2014  ? Chest pain 09/13/2014  ? Abdominal pain, recurrent 09/13/2014  ? Dehydration with hyponatremia 09/13/2014  ? Hyperkalemia 09/13/2014  ? Acute renal failure superimposed on stage 3 chronic kidney disease ([place]) 09/13/2014  ? Anemia 05/06/2014  ? Diabetic neuropathy, type II diabetes mellitus ([place]) 05/06/2014  ? Obesity (BMI 30-39.9) 05/06/2014  ? Cellulitis of right foot 05/06/2014  ? Essential hypertension 03/07/2011  ? PERS HX NONCOMPLIANCE W/MED TX PRS HAZARDS HLTH 02/07/2009  ? Right arm pain 08/04/2006  ? Osteoarthritis 02/14/2006  ? Uncontrolled type 2 diabetes mellitus with hyperglycemia ([place]) 01/11/2006  ? ?Past Medical History:  ?Diagnosis Date  ? Anemia   ? Arthritis   ? right finger  ? CHF (congestive heart failure) ([place])   ? Chronic renal disease, stage 3, moderately decreased glomerular filtration rate (GFR) between 30-59 mL/min/1.73 square meter (HCC)   ? Diabetes mellitus   ? GERD (gastroesophageal reflux disease)   ? Hypertension   ? ?  Past Surgical History:  ?Procedure Laterality Date  ? AMPUTATION Right 05/08/2014  ? Procedure: AMPUTATION RAY, right great toe;   Surgeon: Newt Minion, MD;  Location: [place];  Service: Orthopedics;  Laterality: Right;  ? arm surgery     ? CESAREAN SECTION    ? COLONOSCOPY    ? I & D EXTREMITY Right 05/08/2014  ? Procedure: IRRIGATION AND DEBRIDEMENT FOOT;  Surgeon: Newt Minion, MD;  Location: [place];  Service: Orthopedics;  Laterality: Right;  ? MEMBRANE PEEL Right 03/15/2018  ? Procedure: MEMBRANE PEEL;  Surgeon: Jalene Mullet, MD;  Location: [place];  Service: Ophthalmology;  Laterality: Right;  ? PHOTOCOAGULATION WITH LASER Right 03/15/2018  ? Procedure: PHOTOCOAGULATION WITH LASER;  Surgeon: Jalene Mullet, MD;  Location: [place];  Service: Ophthalmology;  Laterality: Right;  ? 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 Procedure: RIGHT EYE COMPLEX RETINA DETACHMENT VITRECTOMY MEMBRANE PEEL WITH AIR,GAS,SILICONE OIL PHOTOCOAGULATION;  Surgeon: Jalene Mullet, MD;  Location: [place];  Service: Ophthalmology;  Laterality: Right;  ? ?Social History  ? ?Tobacco Use  ? Smoking status: Never  ? Smokeless tobacco: Never  ?Vaping Use  ? Vaping Use: Never used  ?Substance Use Topics  ? Alcohol use: No  ? Drug use: No  ? ?Social History  ? ?Socioeconomic History  ? Marital status: Married  ?  Spouse name: Not on file  ? Number of children: Not on file  ? Years of education: Not on file  ? Highest education level: Not on file  ?Occupational History  ? Not on file  ?Tobacco Use  ? Smoking status: Never  ? Smokeless tobacco: Never  ?Vaping Use  ? Vaping Use: Never used  ?Substance and Sexual Activity  ? Alcohol use: No  ? Drug use: No  ? Sexual activity: Not on file  ?Other Topics Concern  ? Not on file  ?Social History Narrative  ? From [place].  ? Married.  ? Three children, 9 grandchildren.  ? Works as a Scientist, clinical (histocompatibility and immunogenetics) at [employer]  ? Enjoys reading, relaxing, puzzle books.  ? Travels to [place], [place]  ? ?Social Determinants of Health  ? ?Financial Resource Strain: Not on file  ?Food Insecurity: Not on file  ?Transportation  Needs: Not on file  ?Physical Activity: Not on file  ?Stress: Not on file  ?Social Connections: Not on file  ?Intimate Partner Violence: Not on file  ? ?Family Status  ?Relation Name Status  ? Mother  Deceased  ? Father  Deceased  ? ?Family History  ?Problem Relation Age of Onset  ? Diabetes Mother   ? ?Allergies  ?Allergen Reactions  ? Tramadol Nausea And Vomiting  ? Adhesive [Tape] Itching  ? Bactrim [Sulfamethoxazole-Trimethoprim] Other (See Comments)  ?  AKI and hyperkalemia   ? Tylenol With Codeine #3 [Acetaminophen-Codeine] Diarrhea and Itching  ?  Only allergic to Codeine, patient reports no allergy to Tylenol.  ? ?  ? ?Review of Systems  ?Constitutional: Negative.  Negative for fatigue, malaise/fatigue and weight loss.  ?HENT: Negative.    ?Eyes: Negative.  Negative for blurred vision.  ?Respiratory: Negative.  Negative for shortness of breath.   ?Cardiovascular: Negative.  Negative for chest pain, palpitations and orthopnea.  ?Gastrointestinal: Negative.   ?Genitourinary: Negative.   ?Musculoskeletal:  Positive for back pain and joint pain. Negative for neck pain.  ?Skin: Negative.   ?Neurological: Negative.  Negative for weakness.  ?Endo/Heme/Allergies:  Positive for polydipsia and polyphagia.  ?  Psychiatric/Behavioral: Negative.    ? ?  ?Objective:  ?  ? ?BP (!) 170/71   Pulse 68   Temp 98.4 ?F (36.9 ?C)   Ht 5\' 4"  (1.626 m)   Wt 206 lb (93.4 kg)   SpO2 100%   BMI 35.36 kg/m?  ?BP Readings from Last 3 Encounters:  ?07/01/21 (!) 170/71  ?01/27/21 (!) 146/56  ?01/23/21 (!) 168/87  ? ?Wt Readings from Last 3 Encounters:  ?07/01/21 206 lb (93.4 kg)  ?01/27/21 201 lb 0.2 oz (91.2 kg)  ?12/10/20 204 lb (92.5 kg)  ? ?  ? ?Physical Exam ?Constitutional:   ?   Appearance: Normal appearance. She is obese.  ?HENT:  ?   Mouth/Throat:  ?   Mouth: Mucous membranes are moist.  ?Eyes:  ?   Pupils: Pupils are equal, round, and reactive to light.  ?Cardiovascular:  ?   Rate and Rhythm: Normal rate and regular rhythm.   ?   Pulses: Normal pulses.  ?Pulmonary:  ?   Effort: Pulmonary effort is normal.  ?   Breath sounds: Normal breath sounds.  ?Abdominal:  ?   General: Bowel sounds are normal.  ?Musculoskeletal:     ?   General:

## 2021-07-29 ENCOUNTER — Other Ambulatory Visit: Payer: Medicare HMO

## 2021-08-04 ENCOUNTER — Other Ambulatory Visit: Payer: Self-pay | Admitting: Family Medicine

## 2021-08-06 ENCOUNTER — Other Ambulatory Visit: Payer: Self-pay | Admitting: Nurse Practitioner

## 2021-08-06 DIAGNOSIS — Z9109 Other allergy status, other than to drugs and biological substances: Secondary | ICD-10-CM

## 2021-08-14 ENCOUNTER — Other Ambulatory Visit: Payer: Self-pay

## 2021-08-14 DIAGNOSIS — Z9109 Other allergy status, other than to drugs and biological substances: Secondary | ICD-10-CM

## 2021-08-14 MED ORDER — CETIRIZINE HCL 10 MG PO TABS: 10.0000 mg | ORAL_TABLET | Freq: Every day | ORAL | 2 refills | Status: DC

## 2021-09-16 ENCOUNTER — Other Ambulatory Visit: Payer: Self-pay

## 2021-09-16 NOTE — Patient Outreach (Signed)
[facility]) Care Management  09/16/2021  Rachel Gonzalez [birth year] [id number]   Patient continues to be unable to reach after multiple calls and letter.  RN CM will close case.  [name], RN, MSN [facility] Care Management Care Management Coordinator Direct Line [phone] Toll Free: [phone]  Fax: [phone]

## 2021-09-29 ENCOUNTER — Other Ambulatory Visit: Payer: Self-pay | Admitting: Family Medicine

## 2021-09-29 DIAGNOSIS — R600 Localized edema: Secondary | ICD-10-CM

## 2021-09-29 DIAGNOSIS — I10 Essential (primary) hypertension: Secondary | ICD-10-CM

## 2021-09-29 DIAGNOSIS — E1140 Type 2 diabetes mellitus with diabetic neuropathy, unspecified: Secondary | ICD-10-CM

## 2021-09-30 ENCOUNTER — Ambulatory Visit: Payer: Medicare HMO | Admitting: Family Medicine

## 2021-10-09 ENCOUNTER — Other Ambulatory Visit: Payer: Self-pay | Admitting: Family Medicine

## 2021-10-10 ENCOUNTER — Telehealth: Payer: Self-pay

## 2021-10-10 NOTE — Patient Outreach (Signed)
  Care Coordination   Initial Visit Note   10/10/2021 Name: Rachel Gonzalez MRN: [id number] DOB: 04/09/1950  Rachel Gonzalez is a 71 y.o. year old female who sees Massie Maroon, FNP for primary care. I spoke with  Lenon Ahmadi by phone today  What matters to the patients health and wellness today?  Patient states she is not interested in the program.    Goals Addressed   None     SDOH assessments and interventions completed:  No     Care Coordination Interventions Activated:  No  Care Coordination Interventions:  No, not indicated   Follow up plan: No further intervention required.   Encounter Outcome:  Pt. Refused   Kathyrn Sheriff, RN, MSN, BSN, CCM Care Coordinator [phone]

## 2021-11-04 ENCOUNTER — Ambulatory Visit: Payer: Medicare HMO | Admitting: Family Medicine

## 2021-11-12 ENCOUNTER — Other Ambulatory Visit: Payer: Self-pay | Admitting: Family Medicine

## 2021-11-12 DIAGNOSIS — E1140 Type 2 diabetes mellitus with diabetic neuropathy, unspecified: Secondary | ICD-10-CM

## 2021-11-21 ENCOUNTER — Other Ambulatory Visit: Payer: Self-pay

## 2021-11-21 ENCOUNTER — Emergency Department (HOSPITAL_COMMUNITY): Payer: Medicare HMO

## 2021-11-21 ENCOUNTER — Emergency Department (HOSPITAL_COMMUNITY)
Admission: EM | Admit: 2021-11-21 | Discharge: 2021-11-21 | Disposition: A | Discharge status: Home / Self Care | Payer: Medicare HMO | Attending: Emergency Medicine | Admitting: Emergency Medicine

## 2021-11-21 ENCOUNTER — Encounter (HOSPITAL_COMMUNITY): Payer: Self-pay | Admitting: Emergency Medicine

## 2021-11-21 DIAGNOSIS — I130 Hypertensive heart and chronic kidney disease with heart failure and stage 1 through stage 4 chronic kidney disease, or unspecified chronic kidney disease: Secondary | ICD-10-CM | POA: Diagnosis not present

## 2021-11-21 DIAGNOSIS — S32591A Other specified fracture of right pubis, initial encounter for closed fracture: Secondary | ICD-10-CM

## 2021-11-21 DIAGNOSIS — S59901A Unspecified injury of right elbow, initial encounter: Secondary | ICD-10-CM | POA: Diagnosis not present

## 2021-11-21 DIAGNOSIS — Z79899 Other long term (current) drug therapy: Secondary | ICD-10-CM | POA: Insufficient documentation

## 2021-11-21 DIAGNOSIS — I509 Heart failure, unspecified: Secondary | ICD-10-CM | POA: Insufficient documentation

## 2021-11-21 DIAGNOSIS — G44309 Post-traumatic headache, unspecified, not intractable: Secondary | ICD-10-CM | POA: Diagnosis not present

## 2021-11-21 DIAGNOSIS — N183 Chronic kidney disease, stage 3 unspecified: Secondary | ICD-10-CM | POA: Diagnosis not present

## 2021-11-21 DIAGNOSIS — S0990XA Unspecified injury of head, initial encounter: Secondary | ICD-10-CM | POA: Insufficient documentation

## 2021-11-21 DIAGNOSIS — M25561 Pain in right knee: Secondary | ICD-10-CM | POA: Insufficient documentation

## 2021-11-21 DIAGNOSIS — M25521 Pain in right elbow: Secondary | ICD-10-CM | POA: Insufficient documentation

## 2021-11-21 DIAGNOSIS — M25511 Pain in right shoulder: Secondary | ICD-10-CM | POA: Insufficient documentation

## 2021-11-21 DIAGNOSIS — M542 Cervicalgia: Secondary | ICD-10-CM | POA: Diagnosis not present

## 2021-11-21 DIAGNOSIS — M1611 Unilateral primary osteoarthritis, right hip: Secondary | ICD-10-CM | POA: Diagnosis not present

## 2021-11-21 DIAGNOSIS — M25551 Pain in right hip: Secondary | ICD-10-CM | POA: Diagnosis not present

## 2021-11-21 DIAGNOSIS — R519 Headache, unspecified: Secondary | ICD-10-CM | POA: Diagnosis not present

## 2021-11-21 DIAGNOSIS — S3994XA Unspecified injury of external genitals, initial encounter: Secondary | ICD-10-CM | POA: Diagnosis present

## 2021-11-21 DIAGNOSIS — M25519 Pain in unspecified shoulder: Secondary | ICD-10-CM | POA: Diagnosis not present

## 2021-11-21 DIAGNOSIS — R079 Chest pain, unspecified: Secondary | ICD-10-CM | POA: Diagnosis not present

## 2021-11-21 DIAGNOSIS — S32511A Fracture of superior rim of right pubis, initial encounter for closed fracture: Secondary | ICD-10-CM | POA: Insufficient documentation

## 2021-11-21 DIAGNOSIS — I10 Essential (primary) hypertension: Secondary | ICD-10-CM | POA: Diagnosis not present

## 2021-11-21 DIAGNOSIS — E1122 Type 2 diabetes mellitus with diabetic chronic kidney disease: Secondary | ICD-10-CM | POA: Diagnosis not present

## 2021-11-21 DIAGNOSIS — W01198A Fall on same level from slipping, tripping and stumbling with subsequent striking against other object, initial encounter: Secondary | ICD-10-CM | POA: Insufficient documentation

## 2021-11-21 DIAGNOSIS — R739 Hyperglycemia, unspecified: Secondary | ICD-10-CM | POA: Diagnosis not present

## 2021-11-21 DIAGNOSIS — I110 Hypertensive heart disease with heart failure: Secondary | ICD-10-CM | POA: Insufficient documentation

## 2021-11-21 DIAGNOSIS — M533 Sacrococcygeal disorders, not elsewhere classified: Secondary | ICD-10-CM | POA: Diagnosis not present

## 2021-11-21 DIAGNOSIS — W19XXXA Unspecified fall, initial encounter: Secondary | ICD-10-CM

## 2021-11-21 MED ORDER — OXYCODONE HCL 5 MG PO TABS: 5.0000 mg | ORAL_TABLET | Freq: Four times a day (QID) | ORAL | 0 refills | Status: DC | PRN

## 2021-11-21 MED ORDER — LIDOCAINE 5 % EX PTCH: 1.0000 | MEDICATED_PATCH | Freq: Every day | CUTANEOUS | 0 refills | Status: DC | PRN

## 2021-11-21 MED ORDER — IBUPROFEN 600 MG PO TABS: 600.0000 mg | ORAL_TABLET | Freq: Four times a day (QID) | ORAL | 0 refills | Status: DC | PRN

## 2021-11-21 MED ORDER — ACETAMINOPHEN 325 MG PO TABS: 650.0000 mg | ORAL_TABLET | Freq: Four times a day (QID) | ORAL | 0 refills | Status: AC | PRN

## 2021-11-21 MED ORDER — LIDOCAINE 5 % EX PTCH
1.0000 | MEDICATED_PATCH | CUTANEOUS | Status: DC
  Administered 2021-11-21: 1 via TRANSDERMAL
  Filled 2021-11-21: qty 1

## 2021-11-21 MED ORDER — OXYCODONE HCL 5 MG PO TABS
5.0000 mg | ORAL_TABLET | Freq: Once | ORAL | Status: AC
  Administered 2021-11-21: 5 mg via ORAL
  Filled 2021-11-21: qty 1

## 2021-11-21 NOTE — ED Notes (Signed)
Pt transported to xray 

## 2021-11-21 NOTE — ED Provider Notes (Signed)
[facility] EMERGENCY DEPARTMENT Provider Note   CSN: [id number] Arrival date & time: 11/21/21  1153     History  Chief Complaint  Patient presents with   Rachel Gonzalez is a 71 y.o. female.  Patient as above with significant medical history as below, including CHF, CKD, DM, GERD, HTN who presents to the ED with complaint of fall.  Patient reports that she was at work, she tripped over a telephone cord on the ground and fell.  Pain to her right knee, right hip, right elbow, right shoulder, right side of head.  No LOC, no blood thinners.  No nausea or vomiting.  No numbness or tingling.  Patient unable to get up off the floor secondary to discomfort.  No chest pain or dyspnea, abdominal pain, nausea or vomiting.  No numbness or tingling.  No incontinence.  No LOC     Past Medical History:  Diagnosis Date   Anemia    Arthritis    right finger   CHF (congestive heart failure) (HCC)    Chronic renal disease, stage 3, moderately decreased glomerular filtration rate (GFR) between 30-59 mL/min/1.73 square meter (HCC)    Diabetes mellitus    GERD (gastroesophageal reflux disease)    Hypertension     Past Surgical History:  Procedure Laterality Date   AMPUTATION Right 05/08/2014   Procedure: AMPUTATION RAY, right great toe;  Surgeon: Newt Minion, MD;  Location: [place];  Service: Orthopedics;  Laterality: Right;   arm surgery      CESAREAN SECTION     COLONOSCOPY     I & D EXTREMITY Right 05/08/2014   Procedure: IRRIGATION AND DEBRIDEMENT FOOT;  Surgeon: Newt Minion, MD;  Location: [place];  Service: Orthopedics;  Laterality: Right;   MEMBRANE PEEL Right 03/15/2018   Procedure: MEMBRANE PEEL;  Surgeon: Jalene Mullet, MD;  Location: [place];  Service: Ophthalmology;  Laterality: Right;   PHOTOCOAGULATION WITH LASER Right 03/15/2018   Procedure: PHOTOCOAGULATION WITH LASER;  Surgeon: Jalene Mullet, MD;  Location: [place];  Service: Ophthalmology;  Laterality:  Right;   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 No language interpreter was used.  Fall Associated symptoms include headaches. Pertinent negatives include no chest pain, no abdominal pain and no shortness of breath.       Home Medications Prior to Admission medications   Medication Sig Start Date End Date Taking? Authorizing Provider  acetaminophen (TYLENOL) 325 MG tablet Take 2 tablets (650 mg total) by mouth every 6 (six) hours as needed. 11/21/21  Yes Wynona Dove A, DO  ibuprofen (ADVIL) 600 MG tablet Take 1 tablet (600 mg total) by mouth every 6 (six) hours as needed. 11/21/21  Yes Wynona Dove A, DO  lidocaine (LIDODERM) 5 % Place 1 patch onto the skin daily as needed. Remove & Discard patch within 12 hours or as directed by MD 11/21/21  Yes Wynona Dove A, DO  oxyCODONE (ROXICODONE) 5 MG immediate release tablet Take 1 tablet (5 mg total) by mouth every 6 (six) hours as needed for severe pain. 11/21/21  Yes Jeanell Sparrow, DO  Accu-Chek Softclix Lancets lancets Use 1-4 times daily as needed DX E11.9 04/25/20   Cammie Sickle  M, FNP  acetaminophen (TYLENOL) 500 MG tablet Take 500-1,000 mg by mouth every 6 (six) hours as needed (pain.).    [provider]  amLODipine (NORVASC) 10 MG tablet Take 1 tablet (10 mg total) by mouth daily. 07/01/21   Dorena Dew, FNP  Blood Glucose Monitoring Suppl (ACCU-CHEK GUIDE) w/Device KIT 1 Device by Does not apply route 4 (four) times daily. 04/25/20   Dorena Dew, FNP  brimonidine (ALPHAGAN) 0.2 % ophthalmic solution Place 1 drop into the right eye 2 (two) times daily. 01/10/21   [provider]  cetirizine (ZYRTEC) 10 MG tablet Take 1 tablet (10 mg total)  by mouth daily. 08/14/21   Dorena Dew, FNP  diclofenac Sodium (VOLTAREN) 1 % GEL Apply 4 g topically 4 (four) times daily. 07/01/21   Dorena Dew, FNP  Dulaglutide (TRULICITY) 1.5 OM/7.6HM SOPN Inject 1.5 mg into the skin once a week. 01/14/21   Dorena Dew, FNP  furosemide (LASIX) 20 MG tablet Take 1 tablet by mouth once daily 09/30/21   Dorena Dew, FNP  gabapentin (NEURONTIN) 300 MG capsule Take 1 capsule by mouth twice daily 11/12/21   Dorena Dew, FNP  glucose blood (ACCU-CHEK GUIDE) test strip Use as instructed 06/18/20   Dorena Dew, FNP  ibuprofen (ADVIL) 600 MG tablet Take 1 tablet (600 mg total) by mouth every 6 (six) hours as needed. Patient not taking: Reported on 07/01/2021 01/23/21   Chase Picket, MD  LANTUS SOLOSTAR 100 UNIT/ML Solostar Pen INJECT 15 UNITS INTO THE SKIN  IN THE MORNING AND 10  AT BEDTIME 10/09/21   Dorena Dew, FNP  Lidocaine (HM LIDOCAINE PATCH) 4 % PTCH Apply 1 patch topically daily. Patient not taking: Reported on 07/01/2021 01/27/21   Bo Merino I, NP  nystatin (MYCOSTATIN/NYSTOP) powder APPLY POWDER TOPICALLY TO AFFECTED AREA 4 TIMES DAILY 07/10/21   Fenton Foy, NP  omeprazole (PRILOSEC) 20 MG capsule Take 1 capsule (20 mg total) by mouth daily. 07/15/21   Dorena Dew, FNP  RELION PEN NEEDLE 31G/8MM 31G X 8 MM MISC USE AS DIRECTED 4 TIMES DAILY 12/20/17   Lanae Boast, FNP  timolol (TIMOPTIC) 0.5 % ophthalmic solution Place 1 drop into the right eye 2 (two) times daily. 01/10/21   [provider]  Triamcinolone Acetonide (TRIAMCINOLONE 0.1 % CREAM : EUCERIN) CREA Apply 1 application topically 3 (three) times daily as needed. 05/23/19   Dorena Dew, FNP  triamcinolone ointment (KENALOG) 0.5 % APPLY  OINTMENT TOPICALLY TO AFFECTED AREA TWICE DAILY 06/12/21   Dorena Dew, FNP      Allergies    Tramadol, Adhesive [tape], Bactrim [sulfamethoxazole-trimethoprim], and Tylenol with codeine #3  [acetaminophen-codeine]    Review of Systems   Review of Systems  Constitutional:  Negative for activity change and fever.  HENT:  Negative for facial swelling and trouble swallowing.   Eyes:  Negative for discharge and redness.  Respiratory:  Negative for cough and shortness of breath.   Cardiovascular:  Negative for chest pain and palpitations.  Gastrointestinal:  Negative for abdominal pain and nausea.  Genitourinary:  Negative for dysuria and flank pain.  Musculoskeletal:  Positive for arthralgias and neck pain. Negative for back pain and gait problem.  Skin:  Negative for pallor and rash.  Neurological:  Positive for headaches. Negative for syncope.    Physical Exam Updated Vital Signs BP (!) 149/82 (BP Location: Left Arm)   Pulse 69  Temp 98.4 F (36.9 C) (Oral)   Resp 16   Ht 5' 4"  (1.626 m)   Wt 93 kg   SpO2 99%   BMI 35.19 kg/m  Physical Exam Vitals and nursing note reviewed.  Constitutional:      General: She is not in acute distress.    Appearance: Normal appearance. She is not ill-appearing.  HENT:     Head: Normocephalic and atraumatic. No raccoon eyes, Battle's sign, right periorbital erythema or left periorbital erythema.     Jaw: There is normal jaw occlusion. No trismus.     Right Ear: External ear normal.     Left Ear: External ear normal.     Nose: Nose normal.     Mouth/Throat:     Mouth: Mucous membranes are moist.  Eyes:     General: No scleral icterus.       Right eye: No discharge.        Left eye: No discharge.     Extraocular Movements: Extraocular movements intact.     Pupils: Pupils are equal, round, and reactive to light.  Neck:     Trachea: Trachea normal. No tracheal deviation.     Comments: C-collar in place She reports midline spinous process tenderness on palpation Cardiovascular:     Rate and Rhythm: Normal rate and regular rhythm.     Pulses: Normal pulses.     Heart sounds: Normal heart sounds.  Pulmonary:     Effort:  Pulmonary effort is normal. No respiratory distress.     Breath sounds: Normal breath sounds.  Abdominal:     General: Abdomen is flat.     Tenderness: There is no abdominal tenderness.  Musculoskeletal:        General: Normal range of motion.     Cervical back: Normal range of motion.     Right lower leg: No edema.     Left lower leg: No edema.       Legs:     Comments: 2+ DP pulses equal bilateral  Skin:    General: Skin is warm and dry.     Capillary Refill: Capillary refill takes less than 2 seconds.  Neurological:     Mental Status: She is alert and oriented to person, place, and time.     GCS: GCS eye subscore is 4. GCS verbal subscore is 5. GCS motor subscore is 6.     Cranial Nerves: Cranial nerves 2-12 are intact.     Sensory: Sensation is intact.     Motor: No tremor.     Coordination: Coordination is intact. Finger-Nose-Finger Test normal.     Comments: Difficult to assess strength of right lower extremity secondary to discomfort  Strength 5/5 bilateral upper extremities  Psychiatric:        Mood and Affect: Mood normal.        Behavior: Behavior normal.     ED Results / Procedures / Treatments   Labs (all labs ordered are listed, but only abnormal results are displayed) Labs Reviewed - No data to display  EKG None  Radiology CT Hip Right Wo Contrast  Result Date: 11/21/2021 CLINICAL DATA:  Hip trauma, fracture suspected, xray done EXAM: CT OF THE RIGHT HIP WITHOUT CONTRAST TECHNIQUE: Multidetector CT imaging of the right hip was performed according to the standard protocol. Multiplanar CT image reconstructions were also generated. RADIATION DOSE REDUCTION: This exam was performed according to the departmental dose-optimization program which includes automated exposure control, adjustment of the mA  and/or kV according to patient size and/or use of iterative reconstruction technique. COMPARISON:  Same day hip radiograph. FINDINGS: Bones/Joint/Cartilage There is a  nondisplaced fracture of the inferior pubic ramus near the ischial tuberosity (series 3, image 85-86). There is no evidence of right femoral neck fracture. 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 Mild degenerative changes of the right SI joint with adjacent bone island in the iliac bone. Tiny bone island in the right inferior pubic ramus. Ligaments Suboptimally assessed by CT. Muscles and Tendons No acute myotendinous abnormality by CT. Soft tissues No focal fluid collection. IMPRESSION: Nondisplaced fracture of the right inferior pubic ramus near the ischial tuberosity. Electronically Signed   By: Maurine Simmering M.D.   On: 11/21/2021 14:56   CT Head Wo Contrast  Result Date: 11/21/2021 CLINICAL DATA:  Posttraumatic headache and neck pain after fall. EXAM: CT HEAD WITHOUT CONTRAST CT CERVICAL SPINE WITHOUT CONTRAST TECHNIQUE: Multidetector CT imaging of the head and cervical spine was performed following the standard protocol without intravenous contrast. Multiplanar CT image reconstructions of the cervical spine were also generated. RADIATION DOSE REDUCTION: This exam was performed according to the departmental dose-optimization program which includes automated exposure control, adjustment of the mA and/or kV according to patient size and/or use of iterative reconstruction technique. COMPARISON:  September 26, 2020. FINDINGS: CT HEAD FINDINGS Brain: No evidence of acute infarction, hemorrhage, hydrocephalus, extra-axial collection or mass lesion/mass effect. Vascular: No hyperdense vessel or unexpected calcification. Skull: Normal. Negative for fracture or focal lesion. Sinuses/Orbits: No acute finding. Other: None. CT CERVICAL SPINE FINDINGS Alignment: Normal. Skull base and vertebrae: No acute fracture. No primary bone lesion or focal pathologic process. Soft tissues and spinal canal: No prevertebral fluid or swelling. No visible canal hematoma. Disc levels: Mild degenerative disc disease is noted at C4-5.  There appears to be partial fusion of the C6-7 disc space potentially due to degenerative change. Upper chest: Negative. Other: None. IMPRESSION: No acute intracranial abnormality seen. No acute abnormality seen in the cervical spine. Electronically Signed   By: Marijo Conception M.D.   On: 11/21/2021 13:20   CT Cervical Spine Wo Contrast  Result Date: 11/21/2021 CLINICAL DATA:  Posttraumatic headache and neck pain after fall. EXAM: CT HEAD WITHOUT CONTRAST CT CERVICAL SPINE WITHOUT CONTRAST TECHNIQUE: Multidetector CT imaging of the head and cervical spine was performed following the standard protocol without intravenous contrast. Multiplanar CT image reconstructions of the cervical spine were also generated. RADIATION DOSE REDUCTION: This exam was performed according to the departmental dose-optimization program which includes automated exposure control, adjustment of the mA and/or kV according to patient size and/or use of iterative reconstruction technique. COMPARISON:  September 26, 2020. FINDINGS: CT HEAD FINDINGS Brain: No evidence of acute infarction, hemorrhage, hydrocephalus, extra-axial collection or mass lesion/mass effect. Vascular: No hyperdense vessel or unexpected calcification. Skull: Normal. Negative for fracture or focal lesion. Sinuses/Orbits: No acute finding. Other: None. CT CERVICAL SPINE FINDINGS Alignment: Normal. Skull base and vertebrae: No acute fracture. No primary bone lesion or focal pathologic process. Soft tissues and spinal canal: No prevertebral fluid or swelling. No visible canal hematoma. Disc levels: Mild degenerative disc disease is noted at C4-5. There appears to be partial fusion of the C6-7 disc space potentially due to degenerative change. Upper chest: Negative. Other: None. IMPRESSION: No acute intracranial abnormality seen. No acute abnormality seen in the cervical spine. Electronically Signed   By: Marijo Conception M.D.   On: 11/21/2021 13:20   DG Chest  1 View  Result  Date: 11/21/2021 CLINICAL DATA:  Tripped and fell with right shoulder and neck pain. EXAM: CHEST  1 VIEW COMPARISON:  09/26/2020 FINDINGS: Lungs are hypoinflated and otherwise clear. Cardiomediastinal silhouette is normal. No acute fracture. IMPRESSION: Hypoinflation without acute cardiopulmonary disease. Electronically Signed   By: Marin Olp M.D.   On: 11/21/2021 13:09   DG Knee Complete 4 Views Right  Result Date: 11/21/2021 CLINICAL DATA:  Tripped and fell with right knee pain. EXAM: RIGHT KNEE - COMPLETE 4+ VIEW COMPARISON:  06/05/2019 FINDINGS: No evidence of fracture, dislocation, or joint effusion. No evidence of arthropathy or other focal bone abnormality. Soft tissues are unremarkable. IMPRESSION: Negative. Electronically Signed   By: Marin Olp M.D.   On: 11/21/2021 13:08   DG Hip Unilat W or Wo Pelvis 2-3 Views Right  Result Date: 11/21/2021 CLINICAL DATA:  Tripped and fell with right hip pain. EXAM: DG HIP (WITH OR WITHOUT PELVIS) 2-3V RIGHT COMPARISON:  06/05/2010 FINDINGS: Mild symmetric degenerative change of the hips. No acute fracture or dislocation. Minimal degenerative change of the sacroiliac joints and spine. IMPRESSION: No acute findings. Electronically Signed   By: Marin Olp M.D.   On: 11/21/2021 13:06   DG Elbow Complete Right  Result Date: 11/21/2021 CLINICAL DATA:  Tripped and fell with right elbow injury/pain. EXAM: RIGHT ELBOW - COMPLETE 3+ VIEW COMPARISON:  None Available. FINDINGS: There is no evidence of fracture, dislocation, or joint effusion. There is no evidence of arthropathy or other focal bone abnormality. Soft tissues are unremarkable. IMPRESSION: Negative. Electronically Signed   By: Marin Olp M.D.   On: 11/21/2021 13:04   DG Shoulder Right  Result Date: 11/21/2021 CLINICAL DATA:  Tripped and fell with right shoulder pain. EXAM: RIGHT SHOULDER - 2+ VIEW COMPARISON:  06/05/2019 FINDINGS: Minimal degenerative change of the [facility] joint and glenohumeral  joints. No evidence of fracture or dislocation. IMPRESSION: No acute findings. Electronically Signed   By: Marin Olp M.D.   On: 11/21/2021 13:03    Procedures Procedures    Medications Ordered in ED Medications  lidocaine (LIDODERM) 5 % 1 patch (1 patch Transdermal Patch Applied 11/21/21 1510)  oxyCODONE (Oxy IR/ROXICODONE) immediate release tablet 5 mg (5 mg Oral Given 11/21/21 1312)    ED Course/ Medical Decision Making/ A&P                           Medical Decision Making Amount and/or Complexity of Data Reviewed Radiology: ordered.  Risk OTC drugs. Prescription drug management.   This patient presents to the ED with chief complaint(s) of fall with pertinent past medical history of CKD, CHF which further complicates the presenting complaint. The complaint involves an extensive differential diagnosis and also carries with it a high risk of complications and morbidity.                         Differential diagnoses includes subdural hematoma, epidural hematoma, acute concussion, traumatic subarachnoid hemorrhage, cerebral contusions, sprain, strain, contusion, soft tissue, etc. . Serious etiologies were considered.   The initial plan is to screening imaging, analgesic   Additional history obtained: Additional history obtained from spouse Records reviewed previous admission documents home meds, prior labs and imaging  Independent labs interpretation:  The following labs were independently interpreted: n/a  Independent visualization of imaging: - I independently visualized the following imaging with scope of interpretation limited to determining acute life  threatening conditions related to emergency care: cT head, C-spine, chest x-ray, knee right, hip right, elbow right, shoulder right, which revealed no acute process. Still w/ sig pain on ambulation, will get CT R hip to eval. This demonstrates right pubic ramus fx non-displaced. Her pelvis XR is WNL.   Continues to have  significant pain to her right hip despite analgesics, unable to bear weight secondary to pain.  Will obtain CT hip  Cardiac monitoring was reviewed and interpreted by myself which shows NSR  Treatment and Reassessment: Norco >> improved  Consultation: - Consulted or discussed management/test interpretation w/ external professional: n/a  Consideration for admission or further workup: Admission was considered   71 yo female to ED with apparent mechanical fall. Found to have non-displaced pubic ramus fracture. She is ambulatory with crutches, pain well improved. Stable for o/p f/u with orthopedics for her fx. She is feeling much better, abd is soft/non-tender. LE are neurovasc intact, HDS. Pelvis is stable with direct ap pressure.   The patient improved significantly and was discharged in stable condition. Detailed discussions were had with the patient regarding current findings, and need for close f/u with PCP or on call doctor. The patient has been instructed to return immediately if the symptoms worsen in any way for re-evaluation. Patient verbalized understanding and is in agreement with current care plan. All questions answered prior to discharge.    Social Determinants of health: Social History   Tobacco Use   Smoking status: Never   Smokeless tobacco: Never  Vaping Use   Vaping Use: Never used  Substance Use Topics   Alcohol use: No   Drug use: No            Final Clinical Impression(s) / ED Diagnoses Final diagnoses:  Closed fracture of ramus of right pubis, initial encounter ([place])  Fall, initial encounter    Rx / DC Orders ED Discharge Orders          Ordered    oxyCODONE (ROXICODONE) 5 MG immediate release tablet  Every 6 hours PRN        11/21/21 1518    acetaminophen (TYLENOL) 325 MG tablet  Every 6 hours PRN        11/21/21 1518    ibuprofen (ADVIL) 600 MG tablet  Every 6 hours PRN        11/21/21 1518    lidocaine (LIDODERM) 5 %  Daily PRN         11/21/21 1528              Jeanell Sparrow, DO 11/21/21 1536

## 2021-11-21 NOTE — ED Notes (Signed)
Pt was able to ambulate 10 ft with slight hop d/t soreness. Pt stated she feels if her pain is controlled she can ambulate well.  EDP notified.

## 2021-11-21 NOTE — Discharge Instructions (Addendum)
It was a pleasure caring for you today in the emergency department.  Please return to the emergency department for any worsening or worrisome symptoms.  Please call orthopedics to set up appointment, please use crutches until then

## 2021-11-21 NOTE — ED Notes (Addendum)
Attempt to ambulate pt in the hallway. Patient c/o dizziness while walking and right hip pain. RN and MD are aware. Will reattempt.

## 2021-11-21 NOTE — ED Triage Notes (Signed)
Pt bib ems from work c/o fall. Pt tripped over her feet and hit her head on the wall. Pt complains of right side shoulder, neck, and head pain. Pt denies LOC and thinners. Pt has hx HTN and DM.  BP 152/96 HR 98% RR 20 CBG 278

## 2021-12-01 ENCOUNTER — Other Ambulatory Visit: Payer: Self-pay | Admitting: Family Medicine

## 2021-12-01 ENCOUNTER — Telehealth: Payer: Self-pay | Admitting: Pharmacist

## 2021-12-01 DIAGNOSIS — Z9109 Other allergy status, other than to drugs and biological substances: Secondary | ICD-10-CM

## 2021-12-01 NOTE — Progress Notes (Signed)
[place] [facility])                                            [place] Team    12/01/2021  [name] 1950/07/08 [id number]  Placed telephonic outreach to [name] regarding her interest in renewing her approval for patient assistance for the 2024 year. Ms. Risk was approved for Trulicity and Basaglar for the 2023 calendar year. HIPAA compliant voice mail was left for patient to return my call. Will attempt to follow up in 1 to 3 business days.   Loretha Brasil, PharmD [place] Pharmacist Office: [phone]

## 2021-12-03 NOTE — Progress Notes (Signed)
[name]                                            [place] Team    12/03/2021  Rachel Gonzalez Jun 15, 1950 [id number]  Placed second telephonic outreach with patient to discuss her interest in renewing her approval for patient assistance for the 2024 year. [name] was approved for Trulicity and Basaglar for the 2023 calendar year.   Patient requests a follow up phone call later today.   Loretha Brasil, PharmD [place] Pharmacist Office: [phone]

## 2021-12-03 NOTE — Progress Notes (Signed)
[place] [facility])                                            [place] Team    12/03/2021  IREM STONEHAM August 20, 1950 [id number]   Placed third and final outreach attempt for Ms. Carolyn Stare. Calling to discuss interest in renewing her approval for patient assistance for the 2024 year. Ms. Augenstein was approved for Trulicity and Basaglar for the 2023 calendar year.  HIPAA compliant voice mail left for patient to return my call.    [facility] pharmacy referral is being closed due to the following reasons:  -Will close [place] case as I have been unable to engage or maintain contact with patient -I have provided my contact information if patient or family needs to reach out to me in the future.  -Thank you for allowing [facility] pharmacy to be involved in this patient's care.   [facility] is happy to assist the patient/family in the future for clinical pharmacy needs, following a discussion from your team about [place] outreach. Thank you for allowing [facility] to be a part of your patient's care.   Loretha Brasil, PharmD [place] Pharmacist Office: [phone]

## 2021-12-05 ENCOUNTER — Other Ambulatory Visit: Payer: Self-pay | Admitting: Family Medicine

## 2021-12-18 ENCOUNTER — Ambulatory Visit: Payer: Medicare HMO | Admitting: Podiatry

## 2021-12-18 DIAGNOSIS — E1142 Type 2 diabetes mellitus with diabetic polyneuropathy: Secondary | ICD-10-CM | POA: Diagnosis not present

## 2021-12-18 DIAGNOSIS — L84 Corns and callosities: Secondary | ICD-10-CM

## 2021-12-18 NOTE — Progress Notes (Signed)
Subjective:   Patient ID: Rachel Gonzalez, female   DOB: 71 y.o.   MRN: [id number]   HPI Patient presents long-term diabetes has had an amputation of the right hallux who has a keratotic lesions of the fifth metatarsal left and left fifth metatarsal base that can become tender.  Patient has good digital perfusion    ROS      Objective:  Physical Exam  Neurovascular status intact with thick keratotic lesions of the fifth metatarsal subfifth metatarsal base with diminishment of sharp dull vibratory consistent with long-term diabetes and amputated right hallux first metatarsal that healed well     Assessment:  Chronic lesion formation left no indication of nail disease currently with at risk patient long-term diabetes with neuropathic pathology     Plan:  H&P reviewed condition sterile sharp debridement of several lesions left no angiogenic bleeding reappoint routine care as needed

## 2021-12-27 ENCOUNTER — Other Ambulatory Visit: Payer: Self-pay | Admitting: Family Medicine

## 2021-12-27 DIAGNOSIS — R600 Localized edema: Secondary | ICD-10-CM

## 2021-12-27 DIAGNOSIS — I10 Essential (primary) hypertension: Secondary | ICD-10-CM

## 2021-12-29 ENCOUNTER — Other Ambulatory Visit: Payer: Self-pay | Admitting: Family Medicine

## 2022-01-05 ENCOUNTER — Other Ambulatory Visit: Payer: Self-pay | Admitting: Family Medicine

## 2022-01-05 DIAGNOSIS — E1140 Type 2 diabetes mellitus with diabetic neuropathy, unspecified: Secondary | ICD-10-CM

## 2022-01-06 ENCOUNTER — Non-Acute Institutional Stay (HOSPITAL_COMMUNITY)
Admission: RE | Admit: 2022-01-06 | Discharge: 2022-01-06 | Disposition: A | Discharge status: Home / Self Care | Payer: Medicare HMO | Source: Ambulatory Visit | Attending: Internal Medicine | Admitting: Internal Medicine

## 2022-01-06 ENCOUNTER — Encounter: Payer: Self-pay | Admitting: Family Medicine

## 2022-01-06 ENCOUNTER — Ambulatory Visit (INDEPENDENT_AMBULATORY_CARE_PROVIDER_SITE_OTHER): Payer: Medicare HMO | Admitting: Family Medicine

## 2022-01-06 VITALS — BP 138/62 | HR 63 | Temp 97.2°F | Wt 205.8 lb

## 2022-01-06 DIAGNOSIS — R600 Localized edema: Secondary | ICD-10-CM | POA: Diagnosis not present

## 2022-01-06 DIAGNOSIS — Z9109 Other allergy status, other than to drugs and biological substances: Secondary | ICD-10-CM

## 2022-01-06 DIAGNOSIS — E1165 Type 2 diabetes mellitus with hyperglycemia: Secondary | ICD-10-CM | POA: Insufficient documentation

## 2022-01-06 DIAGNOSIS — Z23 Encounter for immunization: Secondary | ICD-10-CM | POA: Diagnosis not present

## 2022-01-06 DIAGNOSIS — Z1231 Encounter for screening mammogram for malignant neoplasm of breast: Secondary | ICD-10-CM

## 2022-01-06 DIAGNOSIS — L304 Erythema intertrigo: Secondary | ICD-10-CM

## 2022-01-06 DIAGNOSIS — I10 Essential (primary) hypertension: Secondary | ICD-10-CM

## 2022-01-06 LAB — BASIC METABOLIC PANEL
Anion gap: 8 (ref 5–15)
BUN: 33 mg/dL — ABNORMAL HIGH (ref 8–23)
CO2: 28 mmol/L (ref 22–32)
Calcium: 9.5 mg/dL (ref 8.9–10.3)
Chloride: 98 mmol/L (ref 98–111)
Creatinine, Ser: 1.89 mg/dL — ABNORMAL HIGH (ref 0.44–1.00)
GFR, Estimated: 28 mL/min — ABNORMAL LOW (ref >60)
Glucose, Bld: 440 mg/dL — ABNORMAL HIGH (ref 70–99)
Potassium: 4.9 mmol/L (ref 3.5–5.1)
Sodium: 134 mmol/L — ABNORMAL LOW (ref 135–145)

## 2022-01-06 LAB — POCT GLYCOSYLATED HEMOGLOBIN (HGB A1C): Hemoglobin A1C: 15 % — AB (ref 4.0–5.6)

## 2022-01-06 LAB — GLUCOSE, POCT (MANUAL RESULT ENTRY): POC Glucose: 433 mg/dl — AB (ref 70–99)

## 2022-01-06 LAB — MAGNESIUM: Magnesium: 2.4 mg/dL (ref 1.7–2.4)

## 2022-01-06 MED ORDER — CETIRIZINE HCL 10 MG PO TABS: 10.0000 mg | ORAL_TABLET | Freq: Every day | ORAL | 1 refills | Status: DC

## 2022-01-06 MED ORDER — INSULIN NPH (HUMAN) (ISOPHANE) 100 UNIT/ML ~~LOC~~ SUSP: 20.0000 [IU] | Freq: Once | SUBCUTANEOUS | Status: DC

## 2022-01-06 MED ORDER — NYSTATIN 100000 UNIT/GM EX POWD: CUTANEOUS | 5 refills | Status: DC

## 2022-01-06 MED ORDER — TRIAMCINOLONE ACETONIDE 0.5 % EX OINT: TOPICAL_OINTMENT | CUTANEOUS | 5 refills | Status: DC

## 2022-01-06 MED ORDER — SODIUM CHLORIDE 0.9 % IV BOLUS
1000.0000 mL | Freq: Once | INTRAVENOUS | Status: AC
  Administered 2022-01-06: 1000 mL via INTRAVENOUS

## 2022-01-06 NOTE — Progress Notes (Signed)
Pt came over to to the day hospital after being seen at primary care and having elevated blood sugar. Pt received 1,000 mL Sodium chloride 0.9% IVF bolus via PIV. Pt CBG checked after fluids and was 335, [place], [place] notified. Pt BP 172/77, [place], [place] notified. BP rechecked and was 141/71. Per provider ok for pt to be discharged. AVS given. Pt awake, alert, and ambulatory with walker at discharge.

## 2022-01-06 NOTE — Progress Notes (Unsigned)
Established Patient Office Visit  Subjective   Patient ID: Rachel Gonzalez, female    DOB: [birth year]  Age: 71 y.o. MRN: [id number]  Chief Complaint  Patient presents with   sickle cell follw up    With diabetes    Rachel Gonzalez is a 71 year old female with a medical history significant for uncontrolled type 2 diabetes mellitus, hypertension, hyperlipidemia, diabetic neuropathy, stage III chronic kidney disease, and obesity presents for follow-up of chronic conditions.  Patient has been lost to follow-up over the past 6 months.  She states that she has been dealing with some personal issues including her husband being diagnosed with prostate cancer and she has been his primary caregiver. Patient has not been checking blood glucose or consistently taking Lantus twice daily.  She also states that she has not had Trulicity over the past several months.  She says that she needs to use a mail order pharmacy, but has not been utilizing this pharmacy and has been paying significantly higher prices at [employer].  Patient endorses polyuria.  She denies dizziness, polyphasia, chest pain, shortness of breath, headache, or blurred vision.  Of note, patient has been lost to follow-up with her ophthalmologist who she was previously seeing around every 6 months. Patient also has a history of hypertension.  Upon arrival, patient's blood pressure is 138/62, which is at goal.  She has been consistently taking amlodipine. Patient has reestablish care with her podiatrist and is scheduled to follow-up.  She has not followed up with cardiology in greater than 1 year.    Patient Active Problem List   Diagnosis Date Noted   Acute metabolic encephalopathy 40/98/1191   Acute lower UTI 04/11/2020   DKA (diabetic ketoacidosis) ([place]) 04/10/2020   Altered mental status 04/10/2020   CAP (community acquired pneumonia) 04/10/2020   Pyuria 04/10/2020   Stage 3 chronic kidney disease ([place]) 12/01/2016   Frequent falls  04/28/2016   Right arm weakness 04/28/2016   Bilateral lower extremity edema 04/28/2016   Screening for colon cancer 04/28/2016   Abnormal urinalysis 09/13/2014   Chest pain 09/13/2014   Abdominal pain, recurrent 09/13/2014   Dehydration with hyponatremia 09/13/2014   Hyperkalemia 09/13/2014   Acute renal failure superimposed on stage 3 chronic kidney disease ([place]) 09/13/2014   Anemia 05/06/2014   Diabetic neuropathy, type II diabetes mellitus ([place]) 05/06/2014   Obesity (BMI 30-39.9) 05/06/2014   Cellulitis of right foot 05/06/2014   Essential hypertension 03/07/2011   PERS HX NONCOMPLIANCE W/MED TX PRS HAZARDS HLTH 02/07/2009   Right arm pain 08/04/2006   Osteoarthritis 02/14/2006   Uncontrolled type 2 diabetes mellitus with hyperglycemia ([place]) 01/11/2006   Past Medical History:  Diagnosis Date   Anemia    Arthritis    right finger   CHF (congestive heart failure) (HCC)    Chronic renal disease, stage 3, moderately decreased glomerular filtration rate (GFR) between 30-59 mL/min/1.73 square meter (HCC)    Diabetes mellitus    GERD (gastroesophageal reflux disease)    Hypertension    Past Surgical History:  Procedure Laterality Date   AMPUTATION Right 05/08/2014   Procedure: AMPUTATION RAY, right great toe;  Surgeon: Newt Minion, MD;  Location: [place];  Service: Orthopedics;  Laterality: Right;   arm surgery      CESAREAN SECTION     COLONOSCOPY     I & D EXTREMITY Right 05/08/2014   Procedure: IRRIGATION AND DEBRIDEMENT FOOT;  Surgeon: Newt Minion, MD;  Location: [facility];  Service: Orthopedics;  Laterality: Right;   MEMBRANE PEEL Right 03/15/2018   Procedure: MEMBRANE PEEL;  Surgeon: Jalene Mullet, MD;  Location: [facility];  Service: Ophthalmology;  Laterality: Right;   PHOTOCOAGULATION WITH LASER Right 03/15/2018   Procedure: PHOTOCOAGULATION WITH LASER;  Surgeon: Jalene Mullet, MD;  Location: [facility];  Service: Ophthalmology;  Laterality: Right;   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 03/15/2018   Procedure: RIGHT EYE COMPLEX RETINA DETACHMENT VITRECTOMY MEMBRANE PEEL WITH AIR,GAS,SILICONE OIL PHOTOCOAGULATION;  Surgeon: Jalene Mullet, MD;  Location: [facility];  Service: Ophthalmology;  Laterality: Right;   Social History   Tobacco Use   Smoking status: Never   Smokeless tobacco: Never  Vaping Use   Vaping Use: Never used  Substance Use Topics   Alcohol use: No   Drug use: No   Social History   Socioeconomic History   Marital status: Married    Spouse name: Not on file   Number of children: Not on file   Years of education: Not on file   Highest education level: Not on file  Occupational History   Not on file  Tobacco Use   Smoking status: Never   Smokeless tobacco: Never  Vaping Use   Vaping Use: Never used  Substance and Sexual Activity   Alcohol use: No   Drug use: No   Sexual activity: Not on file  Other Topics Concern   Not on file  Social History Narrative   From [place].   Married.   Three children, 9 grandchildren.   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   Travels to [place], [place]   Social Determinants of Health   Financial Resource Strain: Not on file  Food Insecurity: Not on file  Transportation Needs: No Transportation Needs (07/11/2020)   PRAPARE - Hydrologist (Medical): No    Lack of Transportation (Non-Medical): No  Physical Activity: Not on file  Stress: Not on file  Social Connections: Not on file  Intimate Partner Violence: Not on file   Family Status  Relation Name Status   Mother  Deceased   Father  Deceased   Family History  Problem Relation Age of Onset   Diabetes Mother    Allergies  Allergen Reactions   Tramadol Nausea And Vomiting   Adhesive [Tape] Itching   Bactrim [Sulfamethoxazole-Trimethoprim] Other (See Comments)    AKI and hyperkalemia    Tylenol With Codeine #3 [Acetaminophen-Codeine] Diarrhea and  Itching    Only allergic to Codeine, patient reports no allergy to Tylenol.      Review of Systems  Constitutional: Negative.   HENT: Negative.    Eyes: Negative.   Respiratory: Negative.    Cardiovascular: Negative.   Gastrointestinal: Negative.   Genitourinary: Negative.   Musculoskeletal:  Positive for joint pain.  Skin: Negative.   Neurological: Negative.   Psychiatric/Behavioral: Negative.        Objective:     BP 138/62   Pulse 63   Temp (!) 97.2 F (36.2 C)   Wt 205 lb 12.8 oz (93.4 kg)   SpO2 98%   BMI 35.33 kg/m  BP Readings from Last 3 Encounters:  01/06/22 (!) 141/71  01/06/22 138/62  11/21/21 (!) 148/82   Wt Readings from Last 3 Encounters:  01/06/22 205 lb 12.8 oz (93.4 kg)  11/21/21 205 lb 0.4 oz (93 kg)  07/01/21 206 lb (93.4 kg)  Physical Exam Constitutional:      Appearance: Normal appearance. She is obese.  Eyes:     Pupils: Pupils are equal, round, and reactive to light.  Cardiovascular:     Rate and Rhythm: Normal rate and regular rhythm.     Pulses: Normal pulses.  Pulmonary:     Effort: Pulmonary effort is normal.  Abdominal:     General: There is no distension.     Tenderness: There is no abdominal tenderness.  Musculoskeletal:        General: Normal range of motion.  Skin:    General: Skin is warm.  Neurological:     General: No focal deficit present.     Mental Status: She is alert. Mental status is at baseline.  Psychiatric:        Mood and Affect: Mood normal.        Behavior: Behavior normal.        Thought Content: Thought content normal.        Judgment: Judgment normal.      Results for orders placed or performed during the hospital encounter of 41/96/22  Basic metabolic panel  Result Value Ref Range   Sodium 134 (L) 135 - 145 mmol/L   Potassium 4.9 3.5 - 5.1 mmol/L   Chloride 98 98 - 111 mmol/L   CO2 28 22 - 32 mmol/L   Glucose, Bld 440 (H) 70 - 99 mg/dL   BUN 33 (H) 8 - 23 mg/dL   Creatinine, Ser  1.89 (H) 0.44 - 1.00 mg/dL   Calcium 9.5 8.9 - 10.3 mg/dL   GFR, Estimated 28 (L) >60 mL/min   Anion gap 8 5 - 15  Magnesium  Result Value Ref Range   Magnesium 2.4 1.7 - 2.4 mg/dL  Results for orders placed or performed in visit on 01/06/22  Microalbumin/Creatinine Ratio, Urine  Result Value Ref Range   Creatinine, Urine 68.8 Not Estab. mg/dL   Microalbumin, Urine 80.0 Not Estab. ug/mL   Microalb/Creat Ratio 116 (H) 0 - 29 mg/g creat  Comprehensive metabolic panel  Result Value Ref Range   Glucose 464 (H) 70 - 99 mg/dL   BUN 29 (H) 8 - 27 mg/dL   Creatinine, Ser 1.99 (H) 0.57 - 1.00 mg/dL   eGFR 27 (L) >59 mL/min/1.73   BUN/Creatinine Ratio 15 12 - 28   Sodium 133 (L) 134 - 144 mmol/L   Potassium 5.1 3.5 - 5.2 mmol/L   Chloride 94 (L) 96 - 106 mmol/L   CO2 24 20 - 29 mmol/L   Calcium 9.4 8.7 - 10.3 mg/dL   Total Protein 6.9 6.0 - 8.5 g/dL   Albumin 3.9 3.9 - 4.9 g/dL   Globulin, Total 3.0 1.5 - 4.5 g/dL   Albumin/Globulin Ratio 1.3 1.2 - 2.2   Bilirubin Total <0.2 0.0 - 1.2 mg/dL   Alkaline Phosphatase 274 (H) 44 - 121 IU/L   AST 8 0 - 40 IU/L   ALT 4 0 - 32 IU/L  CBC  Result Value Ref Range   WBC 5.5 3.4 - 10.8 x10E3/uL   RBC 4.38 3.77 - 5.28 x10E6/uL   Hemoglobin 11.6 11.1 - 15.9 g/dL   Hematocrit 38.0 34.0 - 46.6 %   MCV 87 79 - 97 fL   MCH 26.5 (L) 26.6 - 33.0 pg   MCHC 30.5 (L) 31.5 - 35.7 g/dL   RDW 11.8 11.7 - 15.4 %   Platelets 207 150 - 450 x10E3/uL  HgB A1c  Result  Value Ref Range   Hemoglobin A1C 15.0 (A) 4.0 - 5.6 %   HbA1c POC (<> result, manual entry)     HbA1c, POC (prediabetic range)     HbA1c, POC (controlled diabetic range)    Glucose (CBG)  Result Value Ref Range   POC Glucose 433 (A) 70 - 99 mg/dl    Last CBC Lab Results  Component Value Date   WBC 5.5 01/06/2022   HGB 11.6 01/06/2022   HCT 38.0 01/06/2022   MCV 87 01/06/2022   MCH 26.5 (L) 01/06/2022   RDW 11.8 01/06/2022   PLT 207 38/75/6433   Last metabolic panel Lab Results   Component Value Date   GLUCOSE 440 (H) 01/06/2022   NA 134 (L) 01/06/2022   K 4.9 01/06/2022   CL 98 01/06/2022   CO2 28 01/06/2022   BUN 33 (H) 01/06/2022   CREATININE 1.89 (H) 01/06/2022   EGFR 27 (L) 01/06/2022   CALCIUM 9.5 01/06/2022   PHOS 3.4 04/10/2020   PROT 6.9 01/06/2022   ALBUMIN 3.9 01/06/2022   LABGLOB 3.0 01/06/2022   AGRATIO 1.3 01/06/2022   BILITOT <0.2 01/06/2022   ALKPHOS 274 (H) 01/06/2022   AST 8 01/06/2022   ALT 4 01/06/2022   ANIONGAP 8 01/06/2022   Last lipids Lab Results  Component Value Date   CHOL 156 06/26/2019   HDL 78 06/26/2019   LDLCALC 68 06/26/2019   LDLDIRECT 74 08/10/2007   TRIG 43 06/26/2019   CHOLHDL 2.0 06/26/2019   Last hemoglobin A1c Lab Results  Component Value Date   HGBA1C 15.0 (A) 01/06/2022   Last thyroid functions Lab Results  Component Value Date   TSH 0.838 04/10/2020   Last vitamin D No results found for: "25OHVITD2", "25OHVITD3", "VD25OH" Last vitamin B12 and Folate Lab Results  Component Value Date   VITAMINB12 384 05/06/2014   FOLATE 17.4 05/06/2014      The 10-year ASCVD risk score (Arnett DK, et al., 2019) is: 26.1%    Assessment & Plan:   Problem List Items Addressed This Visit       Cardiovascular and Mediastinum   Essential hypertension   Relevant Medications   amLODipine (NORVASC) 10 MG tablet   furosemide (LASIX) 20 MG tablet     Endocrine   Uncontrolled type 2 diabetes mellitus with hyperglycemia (HCC) - Primary   Relevant Medications   insulin NPH Human (NOVOLIN N) injection 20 Units   LANTUS SOLOSTAR 100 UNIT/ML Solostar Pen   Dulaglutide (TRULICITY) 1.5 IR/5.1OA SOPN   Other Relevant Orders   HgB A1c (Completed)   Glucose (CBG) (Completed)   Microalbumin/Creatinine Ratio, Urine (Completed)   Comprehensive metabolic panel (Completed)   CBC (Completed)   Other Visit Diagnoses     Need for pneumococcal vaccine       Relevant Orders   Pneumococcal polysaccharide vaccine  23-valent greater than or equal to 2yo subcutaneous/IM (Completed)   Intertrigo       Relevant Medications   nystatin (MYCOSTATIN/NYSTOP) powder   Allergy to environmental factors       Relevant Medications   triamcinolone ointment (KENALOG) 0.5 %   cetirizine (ZYRTEC) 10 MG tablet   Encounter for screening mammogram for malignant neoplasm of breast       Relevant Orders   MM Digital Screening   Bilateral edema of lower extremity       Relevant Medications   furosemide (LASIX) 20 MG tablet     1. Uncontrolled type 2 diabetes mellitus with  hyperglycemia ([place]) Today, Ms. Bunte hemoglobin A1c was elevated above 15.  Patient has been nonadherent to her medication regimen due to insurance constraints.  Also, patient's glucose level was 433.  Patient was transitioned to the day infusion center and received a 1000 mL normal saline fluid bolus.  Also, Novolin 20 units x 1.  Patient's glucose decreased prior to discharge and she was hemodynamically stable. Notified patient by phone that Lantus will be increased to 25 units in the morning and 15 at bedtime.  We will also restart patient's Trulicity at 1.5 mg weekly.  Discussed the importance of following medication regimen consistently in order to achieve positive outcomes. Also, patient will schedule first available with her ophthalmologist. - HgB A1c - Glucose (CBG) - Microalbumin/Creatinine Ratio, Urine - Comprehensive metabolic panel - CBC - insulin NPH Human (NOVOLIN N) injection 20 Units - LANTUS SOLOSTAR 100 UNIT/ML Solostar Pen; INJECT 25 UNITS INTO THE SKIN IN THE MORNING AND INJECT 15 UNITS AT BEDTIME  Dispense: 15 mL; Refill: 0 - Dulaglutide (TRULICITY) 1.5 QH/4.7ML SOPN; Inject 1.5 mg into the skin once a week.  Dispense: 0.5 mL; Refill: 11  2. Need for pneumococcal vaccine  - Pneumococcal polysaccharide vaccine 23-valent greater than or equal to 2yo subcutaneous/IM  3. Intertrigo  - nystatin (MYCOSTATIN/NYSTOP) powder; APPLY  POWDER TOPICALLY TO AFFECTED AREA 4 TIMES DAILY  Dispense: 15 g; Refill: 5  4. Allergy to environmental factors  - triamcinolone ointment (KENALOG) 0.5 %; APPLY  OINTMENT TOPICALLY TO AFFECTED AREA TWICE DAILY  Dispense: 30 g; Refill: 5 - cetirizine (ZYRTEC) 10 MG tablet; Take 1 tablet (10 mg total) by mouth daily.  Dispense: 90 tablet; Refill: 1  5. Encounter for screening mammogram for malignant neoplasm of breast  - MM Digital Screening; Future  6. Essential hypertension BP 138/62   Pulse 63   Temp (!) 97.2 F (36.2 C)   Wt 205 lb 12.8 oz (93.4 kg)   SpO2 98%   BMI 35.33 kg/m   - amLODipine (NORVASC) 10 MG tablet; Take 1 tablet (10 mg total) by mouth daily.  Dispense: 90 tablet; Refill: 1 - furosemide (LASIX) 20 MG tablet; Take 1 tablet (20 mg total) by mouth daily.  Dispense: 90 tablet; Refill: 1  7. Bilateral edema of lower extremity  - furosemide (LASIX) 20 MG tablet; Take 1 tablet (20 mg total) by mouth daily.  Dispense: 90 tablet; Refill: 1   Return in about 3 months (around 04/08/2022) for diabetes.    Donia Pounds  APRN, MSN, FNP-C [facility] [address] [place],  [address] [phone]

## 2022-01-07 ENCOUNTER — Telehealth: Payer: Self-pay | Admitting: Pharmacist

## 2022-01-07 LAB — COMPREHENSIVE METABOLIC PANEL
ALT: 4 IU/L (ref 0–32)
AST: 8 IU/L (ref 0–40)
Albumin/Globulin Ratio: 1.3 (ref 1.2–2.2)
Albumin: 3.9 g/dL (ref 3.9–4.9)
Alkaline Phosphatase: 274 IU/L — ABNORMAL HIGH (ref 44–121)
BUN/Creatinine Ratio: 15 (ref 12–28)
BUN: 29 mg/dL — ABNORMAL HIGH (ref 8–27)
Bilirubin Total: <0.2 mg/dL (ref 0.0–1.2)
CO2: 24 mmol/L (ref 20–29)
Calcium: 9.4 mg/dL (ref 8.7–10.3)
Chloride: 94 mmol/L — ABNORMAL LOW (ref 96–106)
Creatinine, Ser: 1.99 mg/dL — ABNORMAL HIGH (ref 0.57–1.00)
Globulin, Total: 3 g/dL (ref 1.5–4.5)
Glucose: 464 mg/dL — ABNORMAL HIGH (ref 70–99)
Potassium: 5.1 mmol/L (ref 3.5–5.2)
Sodium: 133 mmol/L — ABNORMAL LOW (ref 134–144)
Total Protein: 6.9 g/dL (ref 6.0–8.5)
eGFR: 27 mL/min/{1.73_m2} — ABNORMAL LOW (ref >59)

## 2022-01-07 LAB — MICROALBUMIN / CREATININE URINE RATIO
Creatinine, Urine: 68.8 mg/dL
Microalb/Creat Ratio: 116 mg/g creat — ABNORMAL HIGH (ref 0–29)
Microalbumin, Urine: 80 ug/mL

## 2022-01-07 LAB — CBC
Hematocrit: 38 % (ref 34.0–46.6)
Hemoglobin: 11.6 g/dL (ref 11.1–15.9)
MCH: 26.5 pg — ABNORMAL LOW (ref 26.6–33.0)
MCHC: 30.5 g/dL — ABNORMAL LOW (ref 31.5–35.7)
MCV: 87 fL (ref 79–97)
Platelets: 207 10*3/uL (ref 150–450)
RBC: 4.38 x10E6/uL (ref 3.77–5.28)
RDW: 11.8 % (ref 11.7–15.4)
WBC: 5.5 10*3/uL (ref 3.4–10.8)

## 2022-01-07 MED ORDER — LANTUS SOLOSTAR 100 UNIT/ML ~~LOC~~ SOPN: PEN_INJECTOR | SUBCUTANEOUS | 0 refills | Status: DC

## 2022-01-07 MED ORDER — FUROSEMIDE 20 MG PO TABS: 20.0000 mg | ORAL_TABLET | Freq: Every day | ORAL | 1 refills | Status: DC

## 2022-01-07 MED ORDER — TRULICITY 1.5 MG/0.5ML ~~LOC~~ SOAJ: 1.5000 mg | SUBCUTANEOUS | 11 refills | Status: DC

## 2022-01-07 MED ORDER — AMLODIPINE BESYLATE 10 MG PO TABS: 10.0000 mg | ORAL_TABLET | Freq: Every day | ORAL | 1 refills | Status: DC

## 2022-01-07 NOTE — Progress Notes (Signed)
This patient is appearing on the insurance-provided list for being at risk of failing the adherence measure for Statin Use in Persons with Diabetes (SUPD) medications this calendar year.   Clinical ASCVD: No  The 10-year ASCVD risk score (Arnett DK, et al., 2019) is: 26.1%   Values used to calculate the score:     Age: 71 years     Sex: Female     Is Non-Hispanic African American: Yes     Diabetic: Yes     Tobacco smoker: No     Systolic Blood Pressure: 384 mmHg     Is BP treated: Yes     HDL Cholesterol: 78 mg/dL     Total Cholesterol: 156 mg/dL    Patient was seen yesterday in office, A1c significantly elevated. Appears patient was previously on atorvastatin but it was discontinued, noted reason that "patient not taking".   Attempted to call patient to discuss. Left voicemail for patient to return my call at her convenience.   Catie Hedwig Morton, PharmD, [facility] [phone]

## 2022-01-12 ENCOUNTER — Other Ambulatory Visit: Payer: Medicare HMO | Admitting: Pharmacist

## 2022-01-12 NOTE — Progress Notes (Signed)
01/12/2022 Name: Rachel Gonzalez MRN: [id number] DOB: August 28, 1950  Chief Complaint  Patient presents with   Medication Management   Diabetes   Hypertension    Rachel Gonzalez is a 71 y.o. year old female who presented for a telephone visit.   They were referred to the pharmacist by a quality report for assistance in managing diabetes and hyperlipidemia.   Subjective:  Care Team: Primary Care Provider: Dorena Dew, FNP ; Next Scheduled Visit: 04/07/22  Medication Access/Adherence  Current Pharmacy:  [facility] [address] [place] [address] Phone: [phone] Fax: [phone]  [facility] - [place], [place] [address] Phone: [phone] Fax: [phone]  [facility], [place] [address] Phone: [phone] Fax: [phone]   Patient reports affordability concerns with their medications: Yes  Patient reports access/transportation concerns to their pharmacy: Yes  Patient reports adherence concerns with their medications:  Yes  reports she ran out of Trulicity, Lantus. Reports she has not received Trulicity from the pharmacy yet.    Diabetes:  Current medications: Trulicity 1.5 mg weekly, Lantus 15 units QAM, 10 units QPM Medications tried in the past: renal function not sufficient for metformin; no history of SGLT2 noted  Reports she ran out of Trulicity and Lantus. A1c was elevated. Per chart review, she was receiving through patient assistance from [facility], unclear what happened.     Hypertension:  Current medications: amlodipine 10 mg daily   Hyperlipidemia/ASCVD Risk Reduction  Current lipid lowering medications: none, previously on atorvastatin but appears it was discontinued due to "patient not taking"  Health Maintenance  Health  Maintenance Due  Topic Date Due   Medicare Annual Wellness (AWV)  Never done   MAMMOGRAM  04/22/2017   LIPID PANEL  06/25/2020   OPHTHALMOLOGY EXAM  12/09/2021     Objective: Lab Results  Component Value Date   HGBA1C 15.0 (A) 01/06/2022    Lab Results  Component Value Date   CREATININE 1.89 (H) 01/06/2022   BUN 33 (H) 01/06/2022   NA 134 (L) 01/06/2022   K 4.9 01/06/2022   CL 98 01/06/2022   CO2 28 01/06/2022    Lab Results  Component Value Date   CHOL 156 06/26/2019   HDL 78 06/26/2019   LDLCALC 68 06/26/2019   LDLDIRECT 74 08/10/2007   TRIG 43 06/26/2019   CHOLHDL 2.0 06/26/2019    Medications Reviewed Today     Reviewed by Osker Mason, RPH-CPP (Pharmacist) on 01/12/22 at 1146  Med List Status: <None>   Medication Order Taking? Sig Documenting Provider Last Dose Status Informant  Accu-Chek Softclix Lancets lancets [id number]  Use 1-4 times daily as needed DX E11.9 Dorena Dew, FNP  Active Self  acetaminophen (TYLENOL) 325 MG tablet [id number]  Take 2 tablets (650 mg total) by mouth every 6 (six) hours as needed. Jeanell Sparrow, DO  Active            Med Note ([place] Jan 06, 2022  9:01 AM) Prn last dose this past weekend   amLODipine (NORVASC) 10 MG tablet [id number]  Take 1 tablet (10 mg total) by mouth daily. Dorena Dew, FNP  Active   Blood Glucose Monitoring Suppl (ACCU-CHEK GUIDE) w/Device KIT [id number]  1 Device by Does not apply route 4 (four)  times daily. Dorena Dew, FNP  Active Self  brimonidine (ALPHAGAN) 0.2 % ophthalmic solution [id number]  Place 1 drop into the right eye 2 (two) times daily.  Patient not taking: Reported on 01/06/2022   [provider]  Active   cetirizine (ZYRTEC) 10 MG tablet [id number]  Take 1 tablet (10 mg total) by mouth daily. Dorena Dew, FNP  Active   diclofenac Sodium (VOLTAREN) 1 % GEL [id number]  Apply 4 g topically 4 (four) times daily. Dorena Dew, FNP  Active             Med Note Mallie Mussel, [facility]   Tue Jan 06, 2022  9:03 AM) Prn last dose yesterday  Dulaglutide (TRULICITY) 1.5 FO/2.7XA SOPN [id number] No Inject 1.5 mg into the skin once a week.  Patient not taking: Reported on 01/12/2022   Dorena Dew, FNP Not Taking Active   furosemide (LASIX) 20 MG tablet [id number]  Take 1 tablet (20 mg total) by mouth daily. Dorena Dew, FNP  Active   gabapentin (NEURONTIN) 300 MG capsule [id number]  Take 1 capsule by mouth twice daily Dorena Dew, FNP  Active   glucose blood (ACCU-CHEK GUIDE) test strip [id number]  Use as instructed Dorena Dew, FNP  Active Self  ibuprofen (ADVIL) 800 MG tablet [id number]  Take 800 mg by mouth every 8 (eight) hours as needed. [provider]  Active            Med Note Mallie Mussel, [facility]   Tue Jan 06, 2022  9:07 AM) Prn last dose wed of last week  insulin NPH Human (NOVOLIN N) injection 20 Units [id number]   Dorena Dew, FNP  Consider Medication Status and Discontinue (No longer needed (for PRN medications))   LANTUS SOLOSTAR 100 UNIT/ML Solostar Pen [id number]  INJECT 25 UNITS INTO THE SKIN IN THE MORNING AND INJECT 15 UNITS AT BEDTIME Dorena Dew, FNP  Active            Med Note Jodi Mourning, [facility]   Mon Jan 12, 2022 11:45 AM) 15 units QAM, 10 units QPM  Lidocaine (HM LIDOCAINE PATCH) 4 % PTCH [id number]  Apply 1 patch topically daily.  Patient not taking: Reported on 01/06/2022   Bo Merino I, NP  Active   nystatin (MYCOSTATIN/NYSTOP) powder [id number]  APPLY POWDER TOPICALLY TO AFFECTED AREA 4 TIMES DAILY Dorena Dew, FNP  Active   omeprazole (PRILOSEC) 20 MG capsule [id number]  Take 1 capsule (20 mg total) by mouth daily. Dorena Dew, FNP  Active   oxyCODONE (ROXICODONE) 5 MG immediate release tablet [id number]  Take 1 tablet (5 mg total) by mouth every 6 (six) hours as needed for severe pain. Jeanell Sparrow, DO  Active   RELION PEN NEEDLE 31G/8MM 31G X 8 MM MISC [id number]  USE AS  DIRECTED 4 TIMES DAILY Lanae Boast, FNP  Active Self  timolol (TIMOPTIC) 0.5 % ophthalmic solution [id number]  Place 1 drop into the right eye 2 (two) times daily.  Patient not taking: Reported on 01/06/2022   [provider]  Active   Triamcinolone Acetonide (TRIAMCINOLONE 0.1 % CREAM : EUCERIN) CREA [id number]  Apply 1 application topically 3 (three) times daily as needed.  Patient not taking: Reported on 01/06/2022   Dorena Dew, FNP  Active Self  triamcinolone ointment (KENALOG) 0.5 % [id number]  APPLY  OINTMENT TOPICALLY TO AFFECTED AREA TWICE DAILY Dorena Dew, FNP  Active  Assessment/Plan:   Diabetes: - Currently uncontrolled - Glass blower/designer patient assistance program. Spoke with [facility] (dispenses medications for [employer]). Requested refill on Trulicity and Basaglar, and will call back in 2 days to confirm they shipped.  - Will collaborate with CPhT to re-apply for assistance for [id number] for Trulicity and Basaglar.    Hypertension: - Currently controlled - Reviewed long term cardiovascular and renal outcomes of uncontrolled blood pressure - Will discuss home blood pressure readings moving forward.   Hyperlipidemia/ASCVD Risk Reduction: - Currently untreated. Discussed with patient. She does not remember any issues with atorvastatin before.  - Recommend to re-start atorvastatin 10 mg. Recommend to send to [facility]. Will collaborate with PCP to do so.  Follow Up Plan: phone call in 3 days  Catie TJodi Mourning, PharmD, [facility] [phone]

## 2022-01-14 ENCOUNTER — Telehealth: Payer: Self-pay

## 2022-01-14 NOTE — Telephone Encounter (Signed)
Mailed [employer] RE-ENROLLMENT application to patient home.   Will fax PCP pages for application when returned to the office

## 2022-01-15 ENCOUNTER — Other Ambulatory Visit: Payer: Medicare HMO | Admitting: Pharmacist

## 2022-01-15 MED ORDER — ATORVASTATIN CALCIUM 20 MG PO TABS: 20.0000 mg | ORAL_TABLET | Freq: Every day | ORAL | 3 refills | Status: DC

## 2022-01-15 NOTE — Progress Notes (Signed)
Care Coordination Call  Contacted [facility]). They received the orders for Trulicity and Basaglar. They will ship and patient should receive the order next week.   I will follow up with PCP on re-ordering atorvastatin.   Catie Eppie Gibson, PharmD, [facility] [phone]

## 2022-01-15 NOTE — Progress Notes (Signed)
Refilling atorvastatin per recommendation from PCP.   Catie Eppie Gibson, PharmD, [facility] [phone]

## 2022-01-15 NOTE — Addendum Note (Signed)
Addended by: Nilda Simmer T on: 01/15/2022 12:35 PM   Modules accepted: Orders

## 2022-01-16 ENCOUNTER — Telehealth: Payer: Self-pay

## 2022-01-16 NOTE — Telephone Encounter (Signed)
The form you brought me to send back needs more info. This is not I pt medication list. Pt did advise me that she is not taking the trulicity because they would not give it to her . Please advise

## 2022-01-16 NOTE — Telephone Encounter (Signed)
Thee form you brought back to me is asking for instructions for basaglar. This is not in the pt list. Please advise if you want her on this and how much.  Pt did advise that she was told she couldn't get the trulicity.   KH

## 2022-02-12 ENCOUNTER — Other Ambulatory Visit: Payer: Self-pay | Admitting: Family Medicine

## 2022-02-24 NOTE — Telephone Encounter (Signed)
FAXED PCP page(s) for PAP to office. Please have provider to complete, sign and fax to [phone] ATTN: Dhalia Zingaro L.    [facility] Patient Advocate

## 2022-02-27 ENCOUNTER — Other Ambulatory Visit: Payer: Self-pay | Admitting: Family Medicine

## 2022-02-27 DIAGNOSIS — K219 Gastro-esophageal reflux disease without esophagitis: Secondary | ICD-10-CM

## 2022-03-02 ENCOUNTER — Other Ambulatory Visit: Payer: Self-pay | Admitting: Family Medicine

## 2022-03-06 NOTE — Telephone Encounter (Signed)
Submitted application for TRULICITY 1.5MG /0.5ML, [place] [employer] to [employer] for patient assistance.   Phone: [phone]  [facility] Patient Advocate

## 2022-03-10 ENCOUNTER — Other Ambulatory Visit: Payer: Self-pay | Admitting: Family Medicine

## 2022-03-10 DIAGNOSIS — E1165 Type 2 diabetes mellitus with hyperglycemia: Secondary | ICD-10-CM

## 2022-03-11 ENCOUNTER — Telehealth: Payer: Self-pay

## 2022-03-11 NOTE — Telephone Encounter (Signed)
Called pt to advise that she can go and pick up her lantus from [facility] to cover her until her script comes [facility]. Pt was also advised that her co pay will be 35.00 per pharmacy [facility]. Kh

## 2022-03-24 ENCOUNTER — Other Ambulatory Visit: Payer: Self-pay | Admitting: Nurse Practitioner

## 2022-03-24 DIAGNOSIS — R600 Localized edema: Secondary | ICD-10-CM

## 2022-03-24 DIAGNOSIS — I10 Essential (primary) hypertension: Secondary | ICD-10-CM

## 2022-04-01 ENCOUNTER — Telehealth: Payer: Self-pay | Admitting: Family Medicine

## 2022-04-01 NOTE — Telephone Encounter (Signed)
Left message for patient to call back and schedule Medicare Annual Wellness Visit (AWV) in office.   If not able to come in office, please offer to do virtually or by telephone.  Left office number and my jabber [phone].  AWVI eligible as of 10/07/2017  Please schedule at anytime with Nurse Health Advisor.

## 2022-04-03 ENCOUNTER — Encounter: Payer: Self-pay | Admitting: Pharmacist

## 2022-04-07 ENCOUNTER — Non-Acute Institutional Stay (HOSPITAL_COMMUNITY)
Admission: RE | Admit: 2022-04-07 | Discharge: 2022-04-07 | Disposition: A | Discharge status: Home / Self Care | Payer: Medicare HMO | Source: Ambulatory Visit | Attending: Internal Medicine | Admitting: Internal Medicine

## 2022-04-07 ENCOUNTER — Ambulatory Visit: Payer: Medicare HMO | Admitting: Family Medicine

## 2022-04-07 ENCOUNTER — Encounter: Payer: Self-pay | Admitting: Family Medicine

## 2022-04-07 ENCOUNTER — Other Ambulatory Visit: Payer: Self-pay

## 2022-04-07 VITALS — BP 151/77 | HR 61 | Temp 97.6°F | Resp 16

## 2022-04-07 VITALS — BP 150/63 | HR 69 | Temp 97.3°F | Wt 204.8 lb

## 2022-04-07 DIAGNOSIS — E1165 Type 2 diabetes mellitus with hyperglycemia: Secondary | ICD-10-CM | POA: Diagnosis not present

## 2022-04-07 DIAGNOSIS — N1831 Chronic kidney disease, stage 3a: Secondary | ICD-10-CM

## 2022-04-07 DIAGNOSIS — I10 Essential (primary) hypertension: Secondary | ICD-10-CM | POA: Diagnosis not present

## 2022-04-07 DIAGNOSIS — R600 Localized edema: Secondary | ICD-10-CM | POA: Diagnosis not present

## 2022-04-07 DIAGNOSIS — E785 Hyperlipidemia, unspecified: Secondary | ICD-10-CM | POA: Diagnosis not present

## 2022-04-07 DIAGNOSIS — E1140 Type 2 diabetes mellitus with diabetic neuropathy, unspecified: Secondary | ICD-10-CM | POA: Diagnosis not present

## 2022-04-07 LAB — CBC WITH DIFFERENTIAL/PLATELET
Abs Immature Granulocytes: 0.01 10*3/uL (ref 0.00–0.07)
Basophils Absolute: 0 10*3/uL (ref 0.0–0.1)
Basophils Relative: 1 %
Eosinophils Absolute: 0.1 10*3/uL (ref 0.0–0.5)
Eosinophils Relative: 2 %
HCT: 33.3 % — ABNORMAL LOW (ref 36.0–46.0)
Hemoglobin: 10.6 g/dL — ABNORMAL LOW (ref 12.0–15.0)
Immature Granulocytes: 0 %
Lymphocytes Relative: 36 %
Lymphs Abs: 1.9 10*3/uL (ref 0.7–4.0)
MCH: 26.9 pg (ref 26.0–34.0)
MCHC: 31.8 g/dL (ref 30.0–36.0)
MCV: 84.5 fL (ref 80.0–100.0)
Monocytes Absolute: 0.4 10*3/uL (ref 0.1–1.0)
Monocytes Relative: 7 %
Neutro Abs: 2.8 10*3/uL (ref 1.7–7.7)
Neutrophils Relative %: 54 %
Platelets: 201 10*3/uL (ref 150–400)
RBC: 3.94 MIL/uL (ref 3.87–5.11)
RDW: 13 % (ref 11.5–15.5)
WBC: 5.2 10*3/uL (ref 4.0–10.5)
nRBC: 0 % (ref 0.0–0.2)

## 2022-04-07 LAB — GLUCOSE, POCT (MANUAL RESULT ENTRY): POC Glucose: 425 mg/dl — AB (ref 70–99)

## 2022-04-07 LAB — POCT GLYCOSYLATED HEMOGLOBIN (HGB A1C): Hemoglobin A1C: 15 % — AB (ref 4.0–5.6)

## 2022-04-07 LAB — COMPREHENSIVE METABOLIC PANEL
ALT: 11 U/L (ref 0–44)
AST: 12 U/L — ABNORMAL LOW (ref 15–41)
Albumin: 3.4 g/dL — ABNORMAL LOW (ref 3.5–5.0)
Alkaline Phosphatase: 202 U/L — ABNORMAL HIGH (ref 38–126)
Anion gap: 8 (ref 5–15)
BUN: 26 mg/dL — ABNORMAL HIGH (ref 8–23)
CO2: 27 mmol/L (ref 22–32)
Calcium: 8.7 mg/dL — ABNORMAL LOW (ref 8.9–10.3)
Chloride: 101 mmol/L (ref 98–111)
Creatinine, Ser: 1.55 mg/dL — ABNORMAL HIGH (ref 0.44–1.00)
GFR, Estimated: 36 mL/min — ABNORMAL LOW (ref >60)
Glucose, Bld: 346 mg/dL — ABNORMAL HIGH (ref 70–99)
Potassium: 3.7 mmol/L (ref 3.5–5.1)
Sodium: 136 mmol/L (ref 135–145)
Total Bilirubin: 0.5 mg/dL (ref 0.3–1.2)
Total Protein: 6.4 g/dL — ABNORMAL LOW (ref 6.5–8.1)

## 2022-04-07 MED ORDER — LANTUS SOLOSTAR 100 UNIT/ML ~~LOC~~ SOPN: PEN_INJECTOR | SUBCUTANEOUS | 0 refills | Status: DC

## 2022-04-07 MED ORDER — SODIUM CHLORIDE 0.9 % IV BOLUS
1000.0000 mL | Freq: Once | INTRAVENOUS | Status: AC
  Administered 2022-04-07: 1000 mL via INTRAVENOUS

## 2022-04-07 MED ORDER — TRULICITY 1.5 MG/0.5ML ~~LOC~~ SOAJ: 1.5000 mg | SUBCUTANEOUS | 11 refills | Status: DC

## 2022-04-07 NOTE — Progress Notes (Signed)
PATIENT CARE CENTER NOTE   Diagnosis: Hyperglycemia due to diabetes mellitus   Provider: [place], [place], FNP   Procedure: IV fluids bolus and labs    Note: Patient received fluid bolus of 1 liter 0.9% NS over 1 hour via PIV. Labs drawn by phlebotomist (CBC and CMP). Pre-infusion CBG 425. Patient tolerated bolus well.  CBC rechecked and 291 post bolus. Vital signs stable. Discharge instructions given. Patient alert, oriented and ambulatory at discharge.

## 2022-04-07 NOTE — Progress Notes (Signed)
Established Patient Office Visit  Subjective   Patient ID: Rachel Gonzalez, female    DOB: [birth year]  Age: 72 y.o. MRN: [id number]  Chief Complaint  Patient presents with   Follow-up   Diabetes    Rachel Gonzalez is a very pleasant 72 year old female with a medical history significant for uncontrolled type 2 diabetes mellitus, uncontrolled hypertension, stage III chronic kidney disease, obesity, history of diabetic ketoacidosis, and osteoarthritis presents for follow-up of chronic conditions.  Patient has been lost to follow-up for greater than 6 months.  She states that she has been without her antidiabetic medications due to insurance constraints.  Patient has been working with clinical pharmacist for medication management. Rachel Gonzalez has not been taking any medications consistently.  She does not check blood glucose at home.  Patient is not up-to-date with yearly eye exam.  She has been lost to follow-up.  Patient has not been to podiatrist. She denies any blurry vision, polyuria, polydipsia, or polyphasia. Patient states that it has been very difficult since sustaining a fall some months ago.  Patient fell at work and has been having worsening bilateral hip and low back pain over the past 4 to 5 months.  Patient has not been followed by her orthopedic specialist for this problem.  She states that she previously completed physical therapy.  Patient is ambulating with the assistance of a walker at this time.  Diabetes She presents for her follow-up diabetic visit. She has type 2 diabetes mellitus. Her disease course has been worsening. Hypoglycemia symptoms include nervousness/anxiousness. Pertinent negatives for hypoglycemia include no sweats. Associated symptoms include fatigue. Pertinent negatives for diabetes include no blurred vision, no chest pain, no foot paresthesias, no foot ulcerations, no polydipsia, no polyphagia, no polyuria, no visual change, no weakness and no weight loss.  Pertinent negatives for hypoglycemia complications include no hospitalization. Risk factors for coronary artery disease include obesity and sedentary lifestyle. She rarely participates in exercise. She sees a podiatrist.Eye exam is not current.  Hypertension This is a chronic problem. The problem is uncontrolled. Associated symptoms include peripheral edema. Pertinent negatives include no anxiety, blurred vision, chest pain, orthopnea, palpitations, PND, shortness of breath or sweats. Identifiable causes of hypertension include chronic renal disease.  Hyperlipidemia This is a chronic problem. The problem is uncontrolled. Exacerbating diseases include chronic renal disease, diabetes and obesity. Pertinent negatives include no chest pain or shortness of breath.    Patient Active Problem List   Diagnosis Date Noted   Acute metabolic encephalopathy 99991111   Acute lower UTI 04/11/2020   DKA (diabetic ketoacidosis) ([facility]) 04/10/2020   Altered mental status 04/10/2020   CAP (community acquired pneumonia) 04/10/2020   Pyuria 04/10/2020   Stage 3 chronic kidney disease ([facility] [facility]) 12/01/2016   Frequent falls 04/28/2016   Right arm weakness 04/28/2016   Bilateral lower extremity edema 04/28/2016   Screening for colon cancer 04/28/2016   Abnormal urinalysis 09/13/2014   Chest pain 09/13/2014   Abdominal pain, recurrent 09/13/2014   Dehydration with hyponatremia 09/13/2014   Hyperkalemia 09/13/2014   Acute renal failure superimposed on stage 3 chronic kidney disease ([facility]) 09/13/2014   Anemia 05/06/2014   Diabetic neuropathy, type II diabetes mellitus ([facility]) 05/06/2014   Obesity (BMI 30-39.9) 05/06/2014   Cellulitis of right foot 05/06/2014   Essential hypertension 03/07/2011   PERS HX NONCOMPLIANCE W/MED TX PRS HAZARDS HLTH 02/07/2009   Right arm pain 08/04/2006   Osteoarthritis 02/14/2006   Uncontrolled type 2 diabetes mellitus with hyperglycemia ([facility]) 01/11/2006  Past Medical History:   Diagnosis Date   Anemia    Arthritis    right finger   CHF (congestive heart failure) (HCC)    Chronic renal disease, stage 3, moderately decreased glomerular filtration rate (GFR) between 30-59 mL/min/1.73 square meter (HCC)    Diabetes mellitus    GERD (gastroesophageal reflux disease)    Hypertension    Past Surgical History:  Procedure Laterality Date   AMPUTATION Right 05/08/2014   Procedure: AMPUTATION RAY, right great toe;  Surgeon: Newt Minion, MD;  Location: [facility];  Service: Orthopedics;  Laterality: Right;   arm surgery      CESAREAN SECTION     COLONOSCOPY     I & D EXTREMITY Right 05/08/2014   Procedure: IRRIGATION AND DEBRIDEMENT FOOT;  Surgeon: Newt Minion, MD;  Location: [facility];  Service: Orthopedics;  Laterality: Right;   MEMBRANE PEEL Right 03/15/2018   Procedure: MEMBRANE PEEL;  Surgeon: Jalene Mullet, MD;  Location: [facility];  Service: Ophthalmology;  Laterality: Right;   PHOTOCOAGULATION WITH LASER Right 03/15/2018   Procedure: PHOTOCOAGULATION WITH LASER;  Surgeon: Jalene Mullet, MD;  Location: [facility];  Service: Ophthalmology;  Laterality: Right;   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/2018   Procedure: RIGHT EYE COMPLEX RETINA DETACHMENT VITRECTOMY MEMBRANE PEEL WITH AIR,GAS,SILICONE OIL PHOTOCOAGULATION;  Surgeon: Jalene Mullet, MD;  Location: [facility];  Service: Ophthalmology;  Laterality: Right;   Social History   Tobacco Use   Smoking status: Never   Smokeless tobacco: Never  Vaping Use   Vaping Use: Never used  Substance Use Topics   Alcohol use: No   Drug use: No   Social History   Socioeconomic History   Marital status: Married    Spouse name: Not on file   Number of children: Not on file   Years of education: Not on file   Highest education level: Not on file  Occupational History   Not on file  Tobacco Use   Smoking status: Never   Smokeless tobacco: Never  Vaping Use   Vaping Use: Never used  Substance and Sexual  Activity   Alcohol use: No   Drug use: No   Sexual activity: Not on file  Other Topics Concern   Not on file  Social History Narrative   From [place].   Married.   Three children, 9 grandchildren.   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   Travels to [place], [place]   Social Determinants of Health   Financial Resource Strain: Not on file  Food Insecurity: Not on file  Transportation Needs: No Transportation Needs (07/11/2020)   PRAPARE - Hydrologist (Medical): No    Lack of Transportation (Non-Medical): No  Physical Activity: Not on file  Stress: Not on file  Social Connections: Not on file  Intimate Partner Violence: Not on file   Family Status  Relation Name Status   Mother  Deceased   Father  Deceased   Family History  Problem Relation Age of Onset   Diabetes Mother    Allergies  Allergen Reactions   Tramadol Nausea And Vomiting   Adhesive [Tape] Itching   Bactrim [Sulfamethoxazole-Trimethoprim] Other (See Comments)    AKI and hyperkalemia    Tylenol With Codeine #3 [Acetaminophen-Codeine] Diarrhea and Itching    Only allergic to Codeine, patient reports no allergy to Tylenol.      Review of Systems  Constitutional:  Positive for fatigue. Negative for weight loss.  HENT: Negative.    Eyes:  Negative for blurred vision.  Respiratory: Negative.  Negative for shortness of breath.   Cardiovascular: Negative.  Negative for chest pain, palpitations, orthopnea and PND.  Gastrointestinal: Negative.   Genitourinary: Negative.   Musculoskeletal:  Positive for back pain and joint pain.  Skin: Negative.   Neurological: Negative.  Negative for weakness.  Endo/Heme/Allergies:  Negative for polydipsia and polyphagia.  Psychiatric/Behavioral:  The patient is nervous/anxious.       Objective:     BP (!) 150/63   Pulse 69   Temp (!) 97.3 F (36.3 C)   Wt 204 lb 12.8 oz (92.9 kg)   SpO2  100%   BMI 35.15 kg/m  BP Readings from Last 3 Encounters:  04/07/22 (!) 151/77  04/07/22 (!) 150/63  01/06/22 (!) 141/71   Wt Readings from Last 3 Encounters:  04/07/22 204 lb 12.8 oz (92.9 kg)  01/06/22 205 lb 12.8 oz (93.4 kg)  11/21/21 205 lb 0.4 oz (93 kg)      Physical Exam Constitutional:      Appearance: She is obese.  HENT:     Head: Normocephalic.  Eyes:     Pupils: Pupils are equal, round, and reactive to light.  Cardiovascular:     Rate and Rhythm: Normal rate and regular rhythm.     Pulses: Normal pulses.     Heart sounds: Normal heart sounds.  Pulmonary:     Effort: Pulmonary effort is normal.  Abdominal:     General: Abdomen is flat. Bowel sounds are normal.  Musculoskeletal:        General: Normal range of motion.  Skin:    General: Skin is warm.  Neurological:     General: No focal deficit present.     Mental Status: She is alert. Mental status is at baseline.  Psychiatric:        Mood and Affect: Mood normal.        Behavior: Behavior normal.        Thought Content: Thought content normal.        Judgment: Judgment normal.      Results for orders placed or performed during the hospital encounter of 04/07/22  CBC with Differential/Platelet  Result Value Ref Range   WBC 5.2 4.0 - 10.5 K/uL   RBC 3.94 3.87 - 5.11 MIL/uL   Hemoglobin 10.6 (L) 12.0 - 15.0 g/dL   HCT 33.3 (L) 36.0 - 46.0 %   MCV 84.5 80.0 - 100.0 fL   MCH 26.9 26.0 - 34.0 pg   MCHC 31.8 30.0 - 36.0 g/dL   RDW 13.0 11.5 - 15.5 %   Platelets 201 150 - 400 K/uL   nRBC 0.0 0.0 - 0.2 %   Neutrophils Relative % 54 %   Neutro Abs 2.8 1.7 - 7.7 K/uL   Lymphocytes Relative 36 %   Lymphs Abs 1.9 0.7 - 4.0 K/uL   Monocytes Relative 7 %   Monocytes Absolute 0.4 0.1 - 1.0 K/uL   Eosinophils Relative 2 %   Eosinophils Absolute 0.1 0.0 - 0.5 K/uL   Basophils Relative 1 %   Basophils Absolute 0.0 0.0 - 0.1 K/uL   Immature Granulocytes 0 %   Abs Immature Granulocytes 0.01 0.00 - 0.07  K/uL  Comprehensive metabolic panel  Result Value Ref Range   Sodium 136 135 - 145 mmol/L   Potassium 3.7 3.5 - 5.1 mmol/L  Chloride 101 98 - 111 mmol/L   CO2 27 22 - 32 mmol/L   Glucose, Bld 346 (H) 70 - 99 mg/dL   BUN 26 (H) 8 - 23 mg/dL   Creatinine, Ser 1.55 (H) 0.44 - 1.00 mg/dL   Calcium 8.7 (L) 8.9 - 10.3 mg/dL   Total Protein 6.4 (L) 6.5 - 8.1 g/dL   Albumin 3.4 (L) 3.5 - 5.0 g/dL   AST 12 (L) 15 - 41 U/L   ALT 11 0 - 44 U/L   Alkaline Phosphatase 202 (H) 38 - 126 U/L   Total Bilirubin 0.5 0.3 - 1.2 mg/dL   GFR, Estimated 36 (L) >60 mL/min   Anion gap 8 5 - 15  Results for orders placed or performed in visit on 04/07/22  Glucose (CBG)  Result Value Ref Range   POC Glucose 425 (A) 70 - 99 mg/dl  POCT glycosylated hemoglobin (Hb A1C)  Result Value Ref Range   Hemoglobin A1C 15.0 (A) 4.0 - 5.6 %   HbA1c POC (<> result, manual entry)     HbA1c, POC (prediabetic range)     HbA1c, POC (controlled diabetic range)      Last CBC Lab Results  Component Value Date   WBC 5.2 04/07/2022   HGB 10.6 (L) 04/07/2022   HCT 33.3 (L) 04/07/2022   MCV 84.5 04/07/2022   MCH 26.9 04/07/2022   RDW 13.0 04/07/2022   PLT 201 [id number]   Last metabolic panel Lab Results  Component Value Date   GLUCOSE 346 (H) 04/07/2022   NA 136 04/07/2022   K 3.7 04/07/2022   CL 101 04/07/2022   CO2 27 04/07/2022   BUN 26 (H) 04/07/2022   CREATININE 1.55 (H) 04/07/2022   EGFR 27 (L) 01/06/2022   CALCIUM 8.7 (L) 04/07/2022   PHOS 3.4 04/10/2020   PROT 6.4 (L) 04/07/2022   ALBUMIN 3.4 (L) 04/07/2022   LABGLOB 3.0 01/06/2022   AGRATIO 1.3 01/06/2022   BILITOT 0.5 04/07/2022   ALKPHOS 202 (H) 04/07/2022   AST 12 (L) 04/07/2022   ALT 11 04/07/2022   ANIONGAP 8 04/07/2022   Last lipids Lab Results  Component Value Date   CHOL 156 06/26/2019   HDL 78 06/26/2019   LDLCALC 68 06/26/2019   LDLDIRECT 74 08/10/2007   TRIG 43 06/26/2019   CHOLHDL 2.0 06/26/2019   Last hemoglobin A1c Lab  Results  Component Value Date   HGBA1C 15.0 (A) 04/07/2022   Last thyroid functions Lab Results  Component Value Date   TSH 0.838 04/10/2020   Last vitamin D No results found for: "25OHVITD2", "25OHVITD3", "VD25OH" Last vitamin B12 and Folate Lab Results  Component Value Date   VITAMINB12 384 05/06/2014   FOLATE 17.4 05/06/2014      The 10-year ASCVD risk score (Arnett DK, et al., 2019) is: 29.2%    Assessment & Plan:   Problem List Items Addressed This Visit       Cardiovascular and Mediastinum   Essential hypertension   Relevant Medications   atorvastatin (LIPITOR) 20 MG tablet   furosemide (LASIX) 20 MG tablet   amLODipine (NORVASC) 10 MG tablet     Endocrine   Uncontrolled type 2 diabetes mellitus with hyperglycemia (HCC) - Primary   Relevant Medications   atorvastatin (LIPITOR) 20 MG tablet   Dulaglutide (TRULICITY) 1.5 0000000 SOPN   Other Relevant Orders   Comprehensive metabolic panel   CBC with Differential   Glucose (CBG) (Completed)   POCT glycosylated hemoglobin (Hb  A1C) (Completed)     Genitourinary   Stage 3 chronic kidney disease ([facility])   Other Visit Diagnoses     Hyperlipidemia, unspecified hyperlipidemia type       Relevant Medications   atorvastatin (LIPITOR) 20 MG tablet   furosemide (LASIX) 20 MG tablet   amLODipine (NORVASC) 10 MG tablet   Other Relevant Orders   Lipid Panel   Diabetic neuropathy, painful (HCC)       Relevant Medications   atorvastatin (LIPITOR) 20 MG tablet   Dulaglutide (TRULICITY) 1.5 0000000 SOPN   Bilateral edema of lower extremity       Relevant Medications   furosemide (LASIX) 20 MG tablet      1. Uncontrolled type 2 diabetes mellitus with hyperglycemia (HCC) Today, patient's hemoglobin A1c is markedly elevated at 15 and blood glucose is 425.  Patient will receive Novolin 20 units and transition to day hospital for 1 L fluid bolus.  Will restart patient's antidiabetic medications today. - Comprehensive  metabolic panel - CBC with Differential - Glucose (CBG) - POCT glycosylated hemoglobin (Hb A1C)  2. Essential hypertension BP (!) 150/63   Pulse 69   Temp (!) 97.3 F (36.3 C)   Wt 204 lb 12.8 oz (92.9 kg)   SpO2 100%   BMI 35.15 kg/m will restart this patient's home medications today. - Continue medication, monitor blood pressure at home. Continue DASH diet.  Reminder to go to the ER if any CP, SOB, nausea, dizziness, severe HA, changes vision/speech, left arm numbness and tingling and jaw pain.    3. Hyperlipidemia, unspecified hyperlipidemia type  - Lipid Panel  4. Diabetic neuropathy, painful ([facility])   5. Stage 3a chronic kidney disease ([facility]) Patient has been lost to follow-up with [facility].  Encouraged to make a first available appointment.   Follow-up in 1 month   Cammie Sickle, FNP

## 2022-04-09 ENCOUNTER — Telehealth: Payer: Self-pay

## 2022-04-09 NOTE — Telephone Encounter (Signed)
Pt that will need to have help with pre packaged medications. Please advise if you need any other info from me.  Thanks [employer]

## 2022-04-13 ENCOUNTER — Telehealth: Payer: Self-pay

## 2022-04-13 NOTE — Progress Notes (Signed)
   Care Guide Note  04/13/2022 Name: Rachel Gonzalez MRN: [id number] DOB: March 31, 1950  Referred by: Dorena Dew, FNP Reason for referral : Care Coordination (Outreach to schedule with Pharm d Catie for 60 or 30 min appt )   Rachel Gonzalez is a 72 y.o. year old female who is a primary care patient of Dorena Dew, FNP. Leonides Cave was referred to the pharmacist for assistance related to DM.    An unsuccessful telephone outreach was attempted today to contact the patient who was referred to the pharmacy team for assistance with medication management. Additional attempts will be made to contact the patient.   [name], [place], [place] [address] Direct Dial: [phone] [email or web address]

## 2022-04-13 NOTE — Progress Notes (Signed)
   Care Guide Note  04/13/2022 Name: [name] MRN: [id number] DOB: Sep 10, 1950  Referred by: Dorena Dew, FNP Reason for referral : Care Coordination (Outreach to schedule with Pharm d Catie for 60 or 30 min appt )   Rachel Gonzalez is a 72 y.o. year old female who is a primary care patient of Dorena Dew, FNP. Leonides Cave was referred to the pharmacist for assistance related to DM.    Successful contact was made with the patient to discuss pharmacy services including being ready for the pharmacist to call at least 5 minutes before the scheduled appointment time, to have medication bottles and any blood sugar or blood pressure readings ready for review. The patient agreed to meet with the pharmacist via with the pharmacist via telephone visit on (date/time).  05/05/2022  [name], [place], [place] [address] Direct Dial: [phone] [email or web address]

## 2022-04-20 MED ORDER — FUROSEMIDE 20 MG PO TABS: 20.0000 mg | ORAL_TABLET | Freq: Every day | ORAL | 3 refills | Status: DC

## 2022-04-20 MED ORDER — TRULICITY 1.5 MG/0.5ML ~~LOC~~ SOAJ: 1.5000 mg | SUBCUTANEOUS | 11 refills | Status: DC

## 2022-04-20 MED ORDER — ATORVASTATIN CALCIUM 20 MG PO TABS: 20.0000 mg | ORAL_TABLET | Freq: Every day | ORAL | 3 refills | Status: DC

## 2022-04-20 MED ORDER — AMLODIPINE BESYLATE 10 MG PO TABS: 10.0000 mg | ORAL_TABLET | Freq: Every day | ORAL | 1 refills | Status: DC

## 2022-04-29 ENCOUNTER — Telehealth: Payer: Self-pay | Admitting: Family Medicine

## 2022-04-29 NOTE — Telephone Encounter (Signed)
[place] to schedule their annual wellness visit. Patient declined to schedule AWV at this time.  Patient requested I call her back tomorrow afternoon after 3:30. She was driving.  Thank you,  [place] Direct dial  [phone]

## 2022-04-30 ENCOUNTER — Telehealth (HOSPITAL_BASED_OUTPATIENT_CLINIC_OR_DEPARTMENT_OTHER): Payer: Self-pay | Admitting: Family Medicine

## 2022-04-30 ENCOUNTER — Other Ambulatory Visit: Payer: Self-pay | Admitting: Family Medicine

## 2022-04-30 DIAGNOSIS — E1140 Type 2 diabetes mellitus with diabetic neuropathy, unspecified: Secondary | ICD-10-CM

## 2022-04-30 NOTE — Telephone Encounter (Signed)
[facility] [place] to schedule their annual wellness visit. Appointment made for 05/14/2022.  Thank you,  [place] Direct dial  [phone]

## 2022-05-05 ENCOUNTER — Encounter: Payer: Self-pay | Admitting: Pharmacist

## 2022-05-05 ENCOUNTER — Other Ambulatory Visit (HOSPITAL_COMMUNITY): Payer: Self-pay

## 2022-05-05 ENCOUNTER — Other Ambulatory Visit: Payer: Medicare HMO | Admitting: Pharmacist

## 2022-05-05 MED ORDER — FREESTYLE LIBRE 3 SENSOR MISC
3 refills | Status: DC
  Filled 2022-05-05: qty 6, 84d supply, fill #0
  Filled 2022-05-14 (×2): qty 2, 28d supply, fill #0
  Filled 2022-07-07: qty 2, 28d supply, fill #1

## 2022-05-05 MED ORDER — FREESTYLE LIBRE 3 READER DEVI
1.0000 | Freq: Every day | 0 refills | Status: DC
  Filled 2022-05-05: qty 1, 30d supply, fill #0
  Filled 2022-05-07 – 2022-05-14 (×3): qty 1, 1d supply, fill #0

## 2022-05-05 NOTE — Progress Notes (Signed)
05/05/2022 Name: Rachel Gonzalez MRN: [id number] DOB: [birth year]  Chief Complaint  Patient presents with   Medication Management   Diabetes   Hypertension    Rachel Gonzalez is a 72 y.o. year old female who presented for a telephone visit.   They were referred to the pharmacist by their PCP for assistance in managing complex medication management.   Subjective:  Care Team: Primary Care Provider: Dorena Dew, FNP ; Next Scheduled Visit: 07/07/22  Medication Access/Adherence  Current Pharmacy:  [place], [place] [address] Phone: [phone] Fax: [phone]  [place], [place] [address] Phone: [phone] Fax: [place] [address] Phone: [phone] Fax: [phone]   Patient reports affordability concerns with their medications: Yes  Patient reports access/transportation concerns to their pharmacy: No  Patient reports adherence concerns with their medications:  Yes    Patient interested in eventually getting medications adherence packaged. Currently using a weekly pill box   Diabetes:  Current medications: Lantus 15 units QAM, 10 units QPM; Trulicity 1.5 mg weekly Renal function insufficient for metformin; no history of SGLT2  Current glucose readings: does not have a glucometer, requests Libre   Hypertension:  Current medications: amlodipine 10 mg daily, furosemide 20 mg daily  Patient does not have a validated, automated, upper arm home BP cuff  Patient denies hypotensive s/sx including dizziness, lightheadedness.  Patient denies hypertensive symptoms including headache, chest pain, shortness of breath   Hyperlipidemia/ASCVD Risk Reduction  Current lipid lowering medications: atorvastatin 20 mg  daily  Neuropathic Pain: Current medications: gabapentin 300 mg twice daily  Objective:  Lab Results  Component Value Date   HGBA1C 15.0 (A) 04/07/2022    Lab Results  Component Value Date   CREATININE 1.55 (H) 04/07/2022   BUN 26 (H) 04/07/2022   NA 136 04/07/2022   K 3.7 04/07/2022   CL 101 04/07/2022   CO2 27 04/07/2022    Lab Results  Component Value Date   CHOL 156 06/26/2019   HDL 78 06/26/2019   LDLCALC 68 06/26/2019   LDLDIRECT 74 08/10/2007   TRIG 43 06/26/2019   CHOLHDL 2.0 06/26/2019    Medications Reviewed Today     Reviewed by Rachel Gonzalez, RPH-CPP (Pharmacist) on 05/05/22 at 1009  Med List Status: <None>   Medication Order Taking? Sig Documenting Provider Last Dose Status Informant  Accu-Chek Softclix Lancets lancets [id number] Yes Use 1-4 times daily as needed DX E11.9 Rachel Dew, FNP Taking Active Self  acetaminophen (TYLENOL) 325 MG tablet [id number]  Take 2 tablets (650 mg total) by mouth every 6 (six) hours as needed. Rachel Sparrow, DO  Active            Med Note Rachel Gonzalez, [facility]   Tue Apr 07, 2022 10:10 AM) Prn   amLODipine (NORVASC) 10 MG tablet [id number] Yes Take 1 tablet (10 mg total) by mouth daily. Rachel Dew, FNP Taking Active   atorvastatin (LIPITOR) 20 MG tablet [id number] Yes Take 1 tablet (20 mg total) by mouth daily. Rachel Dew, FNP Taking Active   Blood Glucose Monitoring Suppl (ACCU-CHEK GUIDE) w/Device KIT [id number]  1 Device by Does not apply route 4 (four) times daily.  Patient not taking: Reported on 04/07/2022   Rachel Dew,  FNP  Active Self  cetirizine (ZYRTEC) 10 MG tablet [id number] Yes Take 1 tablet (10 mg total) by mouth daily. Rachel Dew, FNP Taking Active   diclofenac Sodium (VOLTAREN) 1 % GEL [id number] Yes Apply 4 g topically 4 (four) times daily. Rachel Dew, FNP Taking Active            Med Note Rachel Gonzalez, Rachel Gonzalez   Tue May 05, 2022 10:06 AM)    Dulaglutide (TRULICITY) 1.5  0000000 Bonney Aid [id number] Yes Inject 1.5 mg into the skin once a week. Rachel Dew, FNP Taking Active   furosemide (LASIX) 20 MG tablet [id number] Yes Take 1 tablet (20 mg total) by mouth daily. Rachel Dew, FNP Taking Active   gabapentin (NEURONTIN) 300 MG capsule [id number] Yes Take 1 capsule by mouth twice daily Rachel Dew, FNP Taking Active   glucose blood (ACCU-CHEK GUIDE) test strip [id number]  Use as instructed  Patient not taking: Reported on 04/07/2022   Rachel Dew, FNP  Active Self  insulin NPH Human (NOVOLIN N) injection 20 Units [id number]   Rachel Dew, FNP  Consider Medication Status and Discontinue (Completed Course)   LANTUS SOLOSTAR 100 UNIT/ML Solostar Pen [id number] Yes INJECT 15 UNITS SUBCUTANEOUSLY IN THE MORNING AND 10 ONCE DAILY AT BEDTIME Rachel Dew, FNP Taking Active   nystatin (MYCOSTATIN/NYSTOP) powder [id number] Yes APPLY POWDER TOPICALLY TO AFFECTED AREA 4 TIMES DAILY Rachel Dew, FNP Taking Active   omeprazole (PRILOSEC) 20 MG capsule [id number] Yes Take 1 capsule by mouth once daily Rachel Dew, FNP Taking Active   RELION PEN NEEDLE 31G/8MM 31G X 8 MM MISC [id number] Yes USE AS DIRECTED 4 TIMES DAILY Rachel Boast, FNP Taking Active Self  triamcinolone ointment (KENALOG) 0.5 % [id number] Yes APPLY  OINTMENT TOPICALLY TO AFFECTED AREA TWICE DAILY Rachel Dew, FNP Taking Active               Assessment/Plan:   Diabetes: - Currently uncontrolled but related to medication access - Reviewed long term cardiovascular and renal outcomes of uncontrolled blood sugar - Reviewed goal A1c, goal fasting, and goal 2 hour post prandial glucose - Order for [place] placed under systemwide standing order. Order went through her insurance. Scheduled in person visit in 2 weeks to train on [place]. - Recommend to continue Trulicity 1.5 mg weekly and continue Lantus 25 units daily, though would advise to take entire dose together at once  given insulin glargine pharmacokinetics.  - Discussed patient access support; patient meets income cut offs for Trulicity/Basaglar assistance, but she may meet Medicare Low Income Subsidy cut off. She will discuss with her daughter this weekend and apply for LIS if able.  - Discussed benefit of SGLT2 moving forward, once glycemic control is obtained (significant glucosuria would likely cause genitourinary side effects if agent was started at this time). Recommend to continue basal insulin + GLP1.    Hypertension: - Currently uncontrolled - Reviewed long term cardiovascular and renal outcomes of uncontrolled blood pressure - Patient notes she has Over The Counter benefits. She will talk to her daughter about helping her order an automatic, upper arm BP cuff using these benefits to allow for home monitoring.  - Recommend to continue amlodipine at this time. Recommend referral to nephrology.   Hyperlipidemia/ASCVD Risk Reduction: - Currently unknown control, overdue for lipid panel.  - Recommend to continue current regimen  Follow Up Plan: pending Libre access  Catie Hedwig Morton, PharmD, BCACP, [facility] [phone]

## 2022-05-05 NOTE — Patient Instructions (Addendum)
Rachel Gonzalez mail out the [place] 3 to you.   [email or web address]  Take care!  Catie Hedwig Morton, PharmD, [place], [facility] [phone]

## 2022-05-06 ENCOUNTER — Other Ambulatory Visit: Payer: Self-pay

## 2022-05-07 ENCOUNTER — Other Ambulatory Visit (HOSPITAL_COMMUNITY): Payer: Self-pay

## 2022-05-07 ENCOUNTER — Other Ambulatory Visit: Payer: Self-pay

## 2022-05-13 ENCOUNTER — Other Ambulatory Visit (HOSPITAL_COMMUNITY): Payer: Self-pay

## 2022-05-14 ENCOUNTER — Other Ambulatory Visit (HOSPITAL_COMMUNITY): Payer: Self-pay

## 2022-05-14 ENCOUNTER — Ambulatory Visit: Payer: Medicare HMO

## 2022-05-14 ENCOUNTER — Telehealth: Payer: Self-pay

## 2022-05-14 ENCOUNTER — Other Ambulatory Visit: Payer: Medicare HMO | Admitting: Pharmacist

## 2022-05-14 VITALS — BP 154/73

## 2022-05-14 DIAGNOSIS — I10 Essential (primary) hypertension: Secondary | ICD-10-CM

## 2022-05-14 MED ORDER — VALSARTAN 80 MG PO TABS
80.0000 mg | ORAL_TABLET | Freq: Every day | ORAL | 1 refills | Status: DC
  Filled 2022-05-14: qty 90, 90d supply, fill #0

## 2022-05-14 NOTE — Progress Notes (Signed)
05/14/2022 Name: Rachel Gonzalez MRN: [id number] DOB: 1951-02-22  Chief Complaint  Patient presents with   Medication Management   Diabetes    Rachel Gonzalez is a 72 y.o. year old female who was referred for medication management by their primary care provider, Dorena Dew, FNP. They presented for a face to face visit today.   They were referred to the pharmacist by a quality report for assistance in managing diabetes and hypertension    Subjective:  Care Team: Primary Care Provider: Dorena Dew, FNP ; Next Scheduled Visit: 06/27/22  Medication Access/Adherence  Current Pharmacy:  [place], [place] [address] Phone: [phone] Fax: [phone]  [place], [place] [address] Phone: [phone] Fax: [place] [address] Phone: [phone] Fax: [phone]   Patient reports affordability concerns with their medications: No  Patient reports access/transportation concerns to their pharmacy: No  Patient reports adherence concerns with their medications:  No     Diabetes:  Current medications: Trulicity 1.5 mg weekly, Lantus 25 units daily,  Medications tried in the past: renal function prevents use of metformin  Hypertension:  Current medications: amlodipine 10 mg daily, furosemide 20 mg PRN- taking daily Previously on: historically on lisinopril, no documentation as to why it was discontinued.   Patient does not have a validated, automated, upper arm home BP cuff, her daughter is going to help order a home BP machine from her OTC benefits  Patient denies hypotensive s/sx including dizziness, lightheadedness.  Patient denies hypertensive symptoms including headache, chest pain, shortness of  breath  Hyperlipidemia/ASCVD Risk Reduction  Current lipid lowering medications: atorvastatin 20 mg daily   Objective:  Lab Results  Component Value Date   HGBA1C 15.0 (A) 04/07/2022    Lab Results  Component Value Date   CREATININE 1.55 (H) 04/07/2022   BUN 26 (H) 04/07/2022   NA 136 04/07/2022   K 3.7 04/07/2022   CL 101 04/07/2022   CO2 27 04/07/2022    Lab Results  Component Value Date   CHOL 156 06/26/2019   HDL 78 06/26/2019   LDLCALC 68 06/26/2019   LDLDIRECT 74 08/10/2007   TRIG 43 06/26/2019   CHOLHDL 2.0 06/26/2019    Medications Reviewed Today     Reviewed by Osker Mason, RPH-CPP (Pharmacist) on 05/14/22 at 1512  Med List Status: <None>   Medication Order Taking? Sig Documenting Provider Last Dose Status Informant  Accu-Chek Softclix Lancets lancets [id number]  Use 1-4 times daily as needed DX E11.9 Dorena Dew, FNP  Active Self  acetaminophen (TYLENOL) 325 MG tablet [id number]  Take 2 tablets (650 mg total) by mouth every 6 (six) hours as needed. Jeanell Sparrow, DO  Active            Med Note Mallie Mussel, [facility]   Tue Apr 07, 2022 10:10 AM) Prn   amLODipine (NORVASC) 10 MG tablet [id number] Yes Take 1 tablet (10 mg total) by mouth daily. Dorena Dew, FNP Taking Active   atorvastatin (LIPITOR) 20 MG tablet [id number] Yes Take 1 tablet (20 mg total) by mouth daily. Dorena Dew, FNP Taking Active   Blood Glucose Monitoring Suppl (ACCU-CHEK GUIDE) w/Device KIT [id number]  1 Device by Does not apply route 4 (four) times daily.  Patient not taking: Reported on 04/07/2022   Dorena Dew, FNP  Active Self  cetirizine (ZYRTEC) 10 MG tablet [id number] Yes Take 1 tablet (10 mg total) by mouth daily. Dorena Dew, FNP Taking Active   Continuous Blood Gluc Receiver (FREESTYLE LIBRE 3 READER) DEVI [id number]  Use to check blood sugar continously as directed Dorena Dew, FNP  Active   Continuous Blood Gluc Sensor (FREESTYLE LIBRE 3  SENSOR) [place] [id number]  Place 1 sensor on the skin every 14 days. Use to check glucose continuously Dorena Dew, FNP  Active   diclofenac Sodium (VOLTAREN) 1 % GEL [id number] No Apply 4 g topically 4 (four) times daily.  Patient not taking: Reported on 05/14/2022   Dorena Dew, FNP Not Taking Active            Med Note Jodi Mourning, [facility]   Tue May 05, 2022 10:06 AM)    Dulaglutide (TRULICITY) 1.5 0000000 Bonney Aid [id number] Yes Inject 1.5 mg into the skin once a week. Dorena Dew, FNP Taking Active   furosemide (LASIX) 20 MG tablet [id number] Yes Take 1 tablet (20 mg total) by mouth daily. Dorena Dew, FNP Taking Active   gabapentin (NEURONTIN) 300 MG capsule [id number] Yes Take 1 capsule by mouth twice daily Dorena Dew, FNP Taking Active   glucose blood (ACCU-CHEK GUIDE) test strip [id number]  Use as instructed  Patient not taking: Reported on 04/07/2022   Dorena Dew, FNP  Active Self  insulin NPH Human (NOVOLIN N) injection 20 Units [id number]   Dorena Dew, FNP  Active   LANTUS SOLOSTAR 100 UNIT/ML Solostar Pen [id number] Yes INJECT 15 UNITS SUBCUTANEOUSLY IN THE MORNING AND 10 ONCE DAILY AT BEDTIME Dorena Dew, FNP Taking Active   nystatin (MYCOSTATIN/NYSTOP) powder [id number]  APPLY POWDER TOPICALLY TO AFFECTED AREA 4 TIMES DAILY Dorena Dew, FNP  Active   omeprazole (PRILOSEC) 20 MG capsule [id number] Yes Take 1 capsule by mouth once daily Dorena Dew, FNP Taking Active   RELION PEN NEEDLE 31G/8MM 31G X 8 MM MISC [id number]  USE AS DIRECTED 4 TIMES DAILY Lanae Boast, FNP  Active Self  triamcinolone ointment (KENALOG) 0.5 % [id number]  APPLY  OINTMENT TOPICALLY TO AFFECTED AREA TWICE DAILY Dorena Dew, FNP  Active               Assessment/Plan:   Diabetes: - Currently uncontrolled - Reviewed long term cardiovascular and renal outcomes of uncontrolled blood sugar - Reviewed goal A1c, goal fasting, and goal 2 hour post  prandial glucose - Recommend to start use of CGM. Collaborated with pharmacy to fill. Patient's sensors are $25/month, patient is amenable to this. Educated on placement, use of Libre 3 CGM. Patient verbalizes understanding - Continue Lantus 25 units daily and Trulicity 1.5 mg weekly. Will continue to titrate Trulicity as tolerated, will work to start SGLT2 as glucosuria improves with improved glycemic control   Hypertension: - Currently uncontrolled - Reviewed long term cardiovascular and renal outcomes of uncontrolled blood pressure - Reviewed appropriate blood pressure monitoring technique and reviewed goal blood pressure. Recommended to check home blood pressure and heart rate daily - Discussed with covering provider, Lazaro Arms. Start valsartan 80 mg daily. Recheck blood pressure and BMP in 2 weeks. Continue amlodipine. Recommend referral to nephrology.  Hyperlipidemia/ASCVD Risk Reduction: - Currently uncontrolled.  - Recommend to check lipids with next labs   Follow Up Plan: phone call in 4 weeks  Catie T.  Jodi Mourning, PharmD, [place], [facility] [phone]

## 2022-05-14 NOTE — Telephone Encounter (Signed)
Unsuccessful attempt to reach patient on preferred number listed in notes for scheduled AWV. Left message on voicemail okay to reschedule. 

## 2022-05-25 ENCOUNTER — Ambulatory Visit: Payer: Self-pay

## 2022-06-11 ENCOUNTER — Other Ambulatory Visit: Payer: Self-pay | Admitting: Nurse Practitioner

## 2022-06-11 ENCOUNTER — Ambulatory Visit: Payer: Medicare HMO

## 2022-06-11 ENCOUNTER — Other Ambulatory Visit: Payer: Self-pay | Admitting: Family Medicine

## 2022-06-11 VITALS — Ht 64.0 in | Wt 204.0 lb

## 2022-06-11 DIAGNOSIS — Z0000 Encounter for general adult medical examination without abnormal findings: Secondary | ICD-10-CM

## 2022-06-11 DIAGNOSIS — E1140 Type 2 diabetes mellitus with diabetic neuropathy, unspecified: Secondary | ICD-10-CM

## 2022-06-11 NOTE — Telephone Encounter (Signed)
Please advised. KH 

## 2022-06-11 NOTE — Progress Notes (Signed)
Subjective:   Rachel Gonzalez is a 72 y.o. female who presents for Medicare Annual (Subsequent) preventive examination.  Review of Systems    Virtual Visit via Telephone Note  I connected with  Rachel Gonzalez on 06/11/22 at  3:15 PM EDT by telephone and verified that I am speaking with the correct person using two identifiers.  Location: Patient: Home Provider: Office Persons participating in the virtual visit: patient/Nurse Health Advisor   I discussed the limitations, risks, security and privacy concerns of performing an evaluation and management service by telephone and the availability of in person appointments. The patient expressed understanding and agreed to proceed.  Interactive audio and video telecommunications were attempted between this nurse and patient, however failed, due to patient having technical difficulties OR patient did not have access to video capability.  We continued and completed visit with audio only.  Some vital signs may be absent or patient reported.   Criselda Peaches, LPN  Cardiac Risk Factors include: advanced age (>60men, >52 women);diabetes mellitus;hypertension     Objective:    Today's Vitals   06/11/22 1517  Weight: 204 lb (92.5 kg)  Height: 5\' 4"  (1.626 m)   Body mass index is 35.02 kg/m.     06/11/2022    3:25 PM 11/21/2021   12:17 PM 07/11/2020   10:52 AM 04/24/2020    2:17 PM 04/11/2020    5:18 PM 05/30/2019    5:52 PM 04/06/2018    3:32 PM  Advanced Directives  Does Patient Have a Medical Advance Directive? No No No No No No No  Would patient like information on creating a medical advance directive? No - Patient declined No - Patient declined No - Patient declined No - Patient declined No - Patient declined Yes (ED - Information included in AVS) No - Patient declined    Current Medications (verified) Outpatient Encounter Medications as of 06/11/2022  Medication Sig   Accu-Chek Softclix Lancets lancets Use 1-4 times daily as needed  DX E11.9   acetaminophen (TYLENOL) 325 MG tablet Take 2 tablets (650 mg total) by mouth every 6 (six) hours as needed.   amLODipine (NORVASC) 10 MG tablet Take 1 tablet (10 mg total) by mouth daily.   atorvastatin (LIPITOR) 20 MG tablet Take 1 tablet (20 mg total) by mouth daily.   Blood Glucose Monitoring Suppl (ACCU-CHEK GUIDE) w/Device KIT 1 Device by Does not apply route 4 (four) times daily. (Patient not taking: Reported on 04/07/2022)   cetirizine (ZYRTEC) 10 MG tablet Take 1 tablet (10 mg total) by mouth daily.   Continuous Blood Gluc Receiver (FREESTYLE LIBRE 3 READER) DEVI Use to check blood sugar continously as directed   Continuous Blood Gluc Sensor (FREESTYLE LIBRE 3 SENSOR) MISC Place 1 sensor on the skin every 14 days. Use to check glucose continuously   diclofenac Sodium (VOLTAREN) 1 % GEL Apply 4 g topically 4 (four) times daily. (Patient not taking: Reported on 05/14/2022)   Dulaglutide (TRULICITY) 1.5 0000000 SOPN Inject 1.5 mg into the skin once a week.   furosemide (LASIX) 20 MG tablet Take 1 tablet (20 mg total) by mouth daily.   gabapentin (NEURONTIN) 300 MG capsule Take 1 capsule by mouth twice daily   glucose blood (ACCU-CHEK GUIDE) test strip Use as instructed (Patient not taking: Reported on 04/07/2022)   LANTUS SOLOSTAR 100 UNIT/ML Solostar Pen INJECT 15 UNITS SUBCUTANEOUSLY IN THE MORNING AND 10 ONCE DAILY AT BEDTIME   nystatin (MYCOSTATIN/NYSTOP) powder APPLY POWDER TOPICALLY  TO AFFECTED AREA 4 TIMES DAILY   omeprazole (PRILOSEC) 20 MG capsule Take 1 capsule by mouth once daily   RELION PEN NEEDLE 31G/8MM 31G X 8 MM MISC USE AS DIRECTED 4 TIMES DAILY   triamcinolone ointment (KENALOG) 0.5 % APPLY  OINTMENT TOPICALLY TO AFFECTED AREA TWICE DAILY   valsartan (DIOVAN) 80 MG tablet Take 1 tablet (80 mg total) by mouth daily.   Facility-Administered Encounter Medications as of 06/11/2022  Medication   insulin NPH Human (NOVOLIN N) injection 20 Units    Allergies  (verified) Tramadol, Adhesive [tape], Bactrim [sulfamethoxazole-trimethoprim], and Tylenol with codeine #3 [acetaminophen-codeine]   History: Past Medical History:  Diagnosis Date   Anemia    Arthritis    right finger   CHF (congestive heart failure)    Chronic renal disease, stage 3, moderately decreased glomerular filtration rate (GFR) between 30-59 mL/min/1.73 square meter    Diabetes mellitus    GERD (gastroesophageal reflux disease)    Hypertension    Past Surgical History:  Procedure Laterality Date   AMPUTATION Right 05/08/2014   Procedure: AMPUTATION RAY, right great toe;  Surgeon: Newt Minion, MD;  Location: [facility];  Service: Orthopedics;  Laterality: Right;   arm surgery      CESAREAN SECTION     COLONOSCOPY     I & D EXTREMITY Right 05/08/2014   Procedure: IRRIGATION AND DEBRIDEMENT FOOT;  Surgeon: Newt Minion, MD;  Location: [facility];  Service: Orthopedics;  Laterality: Right;   MEMBRANE PEEL Right 03/15/2018   Procedure: MEMBRANE PEEL;  Surgeon: Jalene Mullet, MD;  Location: [facility];  Service: Ophthalmology;  Laterality: Right;   PHOTOCOAGULATION WITH LASER Right 03/15/2018   Procedure: PHOTOCOAGULATION WITH LASER;  Surgeon: Jalene Mullet, MD;  Location: [facility];  Service: Ophthalmology;  Laterality: Right;   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;  Service: Ophthalmology;  Laterality: Right;   Family History  Problem Relation Age of Onset   Diabetes Mother    Social History   Socioeconomic History   Marital status: Married    Spouse name: Not on file   Number of children: Not on file   Years of education: Not on file   Highest education level: Not on file  Occupational History   Not on file  Tobacco Use   Smoking status: Never   Smokeless tobacco: Never  Vaping Use   Vaping  Use: Never used  Substance and Sexual Activity   Alcohol use: No   Drug use: No   Sexual activity: Not on file  Other Topics Concern   Not on file  Social History Narrative   From [place].   Married.   Three children, 9 grandchildren.   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   Travels to [place], [place]   Social Determinants of Health   Financial Resource Strain: Low Risk  (06/11/2022)   Overall Financial Resource Strain (CARDIA)    Difficulty of Paying Living Expenses: Not hard at all  Food Insecurity: No Food Insecurity (06/11/2022)   Hunger Vital Sign    Worried About Running Out of Food in the Last Year: Never true    Ran Out of Food in the Last Year: Never true  Transportation Needs: No Transportation Needs (06/11/2022)   PRAPARE - Transportation  Lack of Transportation (Medical): No    Lack of Transportation (Non-Medical): No  Physical Activity: Sufficiently Active (06/11/2022)   Exercise Vital Sign    Days of Exercise per Week: 3 days    Minutes of Exercise per Session: 60 min  Stress: No Stress Concern Present (06/11/2022)   [place]    Feeling of Stress : Not at all  Social Connections: [place] (06/11/2022)   Social Connection and Isolation Panel [NHANES]    Frequency of Communication with Friends and Family: More than three times a week    Frequency of Social Gatherings with Friends and Family: More than three times a week    Attends Religious Services: More than 4 times per year    Active Member of [employer] or Organizations: Yes    Attends Music therapist: More than 4 times per year    Marital Status: Married    Tobacco Counseling Counseling given: Not Answered   Clinical Intake:  Pre-visit preparation completed: No  Pain : No/denies painNutrition Risk Assessment:  Has the patient had any N/V/D within the last 2 months?   No  Does the patient have any non-healing wounds?  No  Has the patient had any unintentional weight loss or weight gain?  No   Diabetes:  Is the patient diabetic?  Yes  If diabetic, was a CBG obtained today?  No  Did the patient bring in their glucometer from home?  No  How often do you monitor your CBG's? Daily.   Financial Strains and Diabetes Management:  Are you having any financial strains with the device, your supplies or your medication? No .  Does the patient want to be seen by Chronic Care Management for management of their diabetes?  No  Would the patient like to be referred to a Nutritionist or for Diabetic Management?  No   Diabetic Exams:  Diabetic Eye Exam: Completed Yes. Overdue for diabetic eye exam. Pt has been advised about the importance in completing this exam. A referral has been placed today. Message sent to referral coordinator for scheduling purposes. Advised pt to expect a call from office referred to regarding appt.  Diabetic Foot Exam: Completed Yes. Pt has been advised about the importance in completing this exam. Pt is scheduled for diabetic foot exam on Followed by PCP.       BMI - recorded: 35.02 Nutritional Status: BMI > 30  Obese Nutritional Risks: None Diabetes: No  How often do you need to have someone help you when you read instructions, pamphlets, or other written materials from your doctor or pharmacy?: 3 - Sometimes (Husband assist)  Diabetic? Yes  Interpreter Needed?: No  Information entered by :: Rolene Arbour LPN   Activities of Daily Living    06/11/2022    3:22 PM  In your present state of health, do you have any difficulty performing the following activities:  Hearing? 0  Vision? 0  Difficulty concentrating or making decisions? 0  Walking or climbing stairs? 0  Dressing or bathing? 0  Doing errands, shopping? 0  Preparing Food and eating ? N  Using the Toilet? N  In the past six months, have you accidently leaked urine? N   Do you have problems with loss of bowel control? N  Managing your Medications? N  Managing your Finances? N  Housekeeping or managing your Housekeeping? N    Patient Care Team: Dorena Dew, FNP as PCP - General (  Family Medicine)  Indicate any recent Medical Services you may have received from other than [facility] providers in the past year (date may be approximate).     Assessment:   This is a routine wellness examination for [place].  Hearing/Vision screen Hearing Screening - Comments:: Denies hearing difficulties   Vision Screening - Comments:: Wears rx glasses - up to date with routine eye exams with  Patient deferred  Dietary issues and exercise activities discussed: Exercise limited by: None identified   Goals Addressed               This Visit's Progress     No current goals (pt-stated)        Depression Screen    06/11/2022    3:22 PM 04/07/2022   10:12 AM 01/06/2022    9:07 AM 12/10/2020   11:01 AM 07/11/2020   10:54 AM 05/07/2020   11:36 AM 05/01/2020    4:54 PM  PHQ 2/9 Scores  PHQ - 2 Score 0 1 0 0 0 0 0  PHQ- 9 Score   3        Fall Risk    06/11/2022    3:23 PM 07/01/2021   10:48 AM 12/10/2020   11:01 AM 07/11/2020   10:53 AM 05/07/2020   11:36 AM  Fall Risk   Falls in the past year? 1 0 1 1 1   Number falls in past yr: 0 0 0 1   Injury with Fall? 1 0 0 1   Comment Injured pelvis. Followed by medical attention      Risk for fall due to : No Fall Risks;Other (Comment) No Fall Risks  History of fall(s)   Risk for fall due to: Comment Tripped over a cart      Follow up Falls prevention discussed Falls evaluation completed  Falls prevention discussed;Education provided Falls evaluation completed    [place]:  Any stairs in or around the home? No  If so, are there any without handrails? No  Home free of loose throw rugs in walkways, pet beds, electrical cords, etc? Yes  Adequate lighting in your home to reduce risk of  falls? Yes   ASSISTIVE DEVICES UTILIZED TO PREVENT FALLS:  Life alert? No  Use of a cane, walker or w/c? Yes  Grab bars in the bathroom? Yes  Shower chair or bench in shower? No  Elevated toilet seat or a handicapped toilet? No   TIMED UP AND GO:  Was the test performed? No . Audio Visit   Cognitive Function:        06/11/2022    3:25 PM  6CIT Screen  What Year? 0 points  What month? 0 points  What time? 0 points  Count back from 20 0 points  Months in reverse 0 points  Repeat phrase 0 points  Total Score 0 points    Immunizations Immunization History  Administered Date(s) Administered   Influenza,inj,Quad PF,6+ Mos 12/10/2020   Influenza-Unspecified 10/29/2021   PFIZER(Purple Top)SARS-COV-2 Vaccination 05/08/2019, 06/06/2019, 12/07/2020   Pneumococcal Conjugate-13 05/23/2019   Pneumococcal Polysaccharide-23 02/12/2015, 01/06/2022   Td 05/07/2014   Tdap 11/21/2007    TDAP status: Up to date  Flu Vaccine status: Up to date  Pneumococcal vaccine status: Up to date  Covid-19 vaccine status: Completed vaccines  Qualifies for Shingles Vaccine? Yes   Zostavax completed No   Shingrix Completed?: No.    Education has been provided regarding the importance of this vaccine. Patient  has been advised to call insurance company to determine out of pocket expense if they have not yet received this vaccine. Advised may also receive vaccine at local pharmacy or Health Dept. Verbalized acceptance and understanding.  Screening Tests Health Maintenance  Topic Date Due   MAMMOGRAM  04/22/2017   LIPID PANEL  06/25/2020   OPHTHALMOLOGY EXAM  12/09/2021   COVID-19 Vaccine (4 - 2023-24 season) 06/27/2022 (Originally 11/07/2021)   DEXA SCAN  07/02/2022 (Originally 11/06/2016)   COLONOSCOPY (Pts 45-75yrs Insurance coverage will need to be confirmed)  07/02/2022 (Originally 11/06/1996)   Zoster Vaccines- Shingrix (1 of 2) 09/10/2022 (Originally 11/06/2001)   FOOT EXAM  07/02/2022    HEMOGLOBIN A1C  07/07/2022   Diabetic kidney evaluation - Urine ACR  01/07/2023   Diabetic kidney evaluation - eGFR measurement  04/08/2023   Medicare Annual Wellness (AWV)  06/11/2023   DTaP/Tdap/Td (3 - Td or Tdap) 05/06/2024   Pneumonia Vaccine 21+ Years old  Completed   Hepatitis C Screening  Completed   HPV VACCINES  Aged Out    Health Maintenance  Health Maintenance Due  Topic Date Due   MAMMOGRAM  04/22/2017   LIPID PANEL  06/25/2020   OPHTHALMOLOGY EXAM  12/09/2021    Colorectal cancer screening: Referral to GI placed Deferred. Pt aware the office will call re: appt.  Mammogram status: Ordered 01/06/22. Pt provided with contact info and advised to call to schedule appt.   Bone Density status: Ordered Deferred. Pt provided with contact info and advised to call to schedule appt.  Lung Cancer Screening: (Low Dose CT Chest recommended if Age 63-80 years, 30 pack-year currently smoking OR have quit w/in 15years.) does not qualify.    Additional Screening:  Hepatitis C Screening: does qualify; Completed 01/27/16  Vision Screening: Recommended annual ophthalmology exams for early detection of glaucoma and other disorders of the eye. Is the patient up to date with their annual eye exam?  Yes  Who is the provider or what is the name of the office in which the patient attends annual eye exams? Deferred If pt is not established with a provider, would they like to be referred to a provider to establish care? No .   Dental Screening: Recommended annual dental exams for proper oral hygiene  Community Resource Referral / Chronic Care Management:  CRR required this visit?  No   CCM required this visit?  No      Plan:     I have personally reviewed and noted the following in the patient's chart:   Medical and social history Use of alcohol, tobacco or illicit drugs  Current medications and supplements including opioid prescriptions. Patient is not currently taking opioid  prescriptions. Functional ability and status Nutritional status Physical activity Advanced directives List of other physicians Hospitalizations, surgeries, and ER visits in previous 12 months Vitals Screenings to include cognitive, depression, and falls Referrals and appointments  In addition, I have reviewed and discussed with patient certain preventive protocols, quality metrics, and best practice recommendations. A written personalized care plan for preventive services as well as general preventive health recommendations were provided to patient.     Criselda Peaches, LPN   [id number]   Nurse Notes: None

## 2022-06-11 NOTE — Patient Instructions (Addendum)
Rachel Gonzalez , Thank you for taking time to come for your Medicare Wellness Visit. I appreciate your ongoing commitment to your health goals. Please review the following plan we discussed and let me know if I can assist you in the future.   These are the goals we discussed:  Goals       No current goals (pt-stated)        This is a list of the screening recommended for you and due dates:  Health Maintenance  Topic Date Due   Mammogram  04/22/2017   Lipid (cholesterol) test  06/25/2020   Eye exam for diabetics  12/09/2021   COVID-19 Vaccine (4 - 2023-24 season) 06/27/2022*   DEXA scan (bone density measurement)  07/02/2022*   Colon Cancer Screening  07/02/2022*   Zoster (Shingles) Vaccine (1 of 2) 09/10/2022*   Complete foot exam   07/02/2022   Hemoglobin A1C  07/07/2022   Yearly kidney health urinalysis for diabetes  01/07/2023   Yearly kidney function blood test for diabetes  04/08/2023   Medicare Annual Wellness Visit  06/11/2023   DTaP/Tdap/Td vaccine (3 - Td or Tdap) 05/06/2024   Pneumonia Vaccine  Completed   Hepatitis C Screening: USPSTF Recommendation to screen - Ages 87-79 yo.  Completed   HPV Vaccine  Aged Out  *Topic was postponed. The date shown is not the original due date.    Advanced directives: Advance directive discussed with you today. Even though you declined this today, please call our office should you change your mind, and we can give you the proper paperwork for you to fill out.   Conditions/risks identified: None  Next appointment: Follow up in one year for your annual wellness visit    Preventive Care 65 Years and Older, Female Preventive care refers to lifestyle choices and visits with your health care provider that can promote health and wellness. What does preventive care include? A yearly physical exam. This is also called an annual well check. Dental exams once or twice a year. Routine eye exams. Ask your health care provider how often you  should have your eyes checked. Personal lifestyle choices, including: Daily care of your teeth and gums. Regular physical activity. Eating a healthy diet. Avoiding tobacco and drug use. Limiting alcohol use. Practicing safe sex. Taking low-dose aspirin every day. Taking vitamin and mineral supplements as recommended by your health care provider. What happens during an annual well check? The services and screenings done by your health care provider during your annual well check will depend on your age, overall health, lifestyle risk factors, and family history of disease. Counseling  Your health care provider may ask you questions about your: Alcohol use. Tobacco use. Drug use. Emotional well-being. Home and relationship well-being. Sexual activity. Eating habits. History of falls. Memory and ability to understand (cognition). Work and work Statistician. Reproductive health. Screening  You may have the following tests or measurements: Height, weight, and BMI. Blood pressure. Lipid and cholesterol levels. These may be checked every 5 years, or more frequently if you are over 19 years old. Skin check. Lung cancer screening. You may have this screening every year starting at age 39 if you have a 30-pack-year history of smoking and currently smoke or have quit within the past 15 years. Fecal occult blood test (FOBT) of the stool. You may have this test every year starting at age 70. Flexible sigmoidoscopy or colonoscopy. You may have a sigmoidoscopy every 5 years or a colonoscopy every 10 years  starting at age 49. Hepatitis C blood test. Hepatitis B blood test. Sexually transmitted disease (STD) testing. Diabetes screening. This is done by checking your blood sugar (glucose) after you have not eaten for a while (fasting). You may have this done every 1-3 years. Bone density scan. This is done to screen for osteoporosis. You may have this done starting at age 65. Mammogram. This may  be done every 1-2 years. Talk to your health care provider about how often you should have regular mammograms. Talk with your health care provider about your test results, treatment options, and if necessary, the need for more tests. Vaccines  Your health care provider may recommend certain vaccines, such as: Influenza vaccine. This is recommended every year. Tetanus, diphtheria, and acellular pertussis (Tdap, Td) vaccine. You may need a Td booster every 10 years. Zoster vaccine. You may need this after age 81. Pneumococcal 13-valent conjugate (PCV13) vaccine. One dose is recommended after age 59. Pneumococcal polysaccharide (PPSV23) vaccine. One dose is recommended after age 90. Talk to your health care provider about which screenings and vaccines you need and how often you need them. This information is not intended to replace advice given to you by your health care provider. Make sure you discuss any questions you have with your health care provider. Document Released: 03/22/2015 Document Revised: 11/13/2015 Document Reviewed: 12/25/2014 Elsevier Interactive Patient Education  2017 [place] Prevention in the Home Falls can cause injuries. They can happen to people of all ages. There are many things you can do to make your home safe and to help prevent falls. What can I do on the outside of my home? Regularly fix the edges of walkways and driveways and fix any cracks. Remove anything that might make you trip as you walk through a door, such as a raised step or threshold. Trim any bushes or trees on the path to your home. Use bright outdoor lighting. Clear any walking paths of anything that might make someone trip, such as rocks or tools. Regularly check to see if handrails are loose or broken. Make sure that both sides of any steps have handrails. Any raised decks and porches should have guardrails on the edges. Have any leaves, snow, or ice cleared regularly. Use sand or salt  on walking paths during winter. Clean up any spills in your garage right away. This includes oil or grease spills. What can I do in the bathroom? Use night lights. Install grab bars by the toilet and in the tub and shower. Do not use towel bars as grab bars. Use non-skid mats or decals in the tub or shower. If you need to sit down in the shower, use a plastic, non-slip stool. Keep the floor dry. Clean up any water that spills on the floor as soon as it happens. Remove soap buildup in the tub or shower regularly. Attach bath mats securely with double-sided non-slip rug tape. Do not have throw rugs and other things on the floor that can make you trip. What can I do in the bedroom? Use night lights. Make sure that you have a light by your bed that is easy to reach. Do not use any sheets or blankets that are too big for your bed. They should not hang down onto the floor. Have a firm chair that has side arms. You can use this for support while you get dressed. Do not have throw rugs and other things on the floor that can make you trip. What  can I do in the kitchen? Clean up any spills right away. Avoid walking on wet floors. Keep items that you use a lot in easy-to-reach places. If you need to reach something above you, use a strong step stool that has a grab bar. Keep electrical cords out of the way. Do not use floor polish or wax that makes floors slippery. If you must use wax, use non-skid floor wax. Do not have throw rugs and other things on the floor that can make you trip. What can I do with my stairs? Do not leave any items on the stairs. Make sure that there are handrails on both sides of the stairs and use them. Fix handrails that are broken or loose. Make sure that handrails are as long as the stairways. Check any carpeting to make sure that it is firmly attached to the stairs. Fix any carpet that is loose or worn. Avoid having throw rugs at the top or bottom of the stairs. If you  do have throw rugs, attach them to the floor with carpet tape. Make sure that you have a light switch at the top of the stairs and the bottom of the stairs. If you do not have them, ask someone to add them for you. What else can I do to help prevent falls? Wear shoes that: Do not have high heels. Have rubber bottoms. Are comfortable and fit you well. Are closed at the toe. Do not wear sandals. If you use a stepladder: Make sure that it is fully opened. Do not climb a closed stepladder. Make sure that both sides of the stepladder are locked into place. Ask someone to hold it for you, if possible. Clearly mark and make sure that you can see: Any grab bars or handrails. First and last steps. Where the edge of each step is. Use tools that help you move around (mobility aids) if they are needed. These include: Canes. Walkers. Scooters. Crutches. Turn on the lights when you go into a dark area. Replace any light bulbs as soon as they burn out. Set up your furniture so you have a clear path. Avoid moving your furniture around. If any of your floors are uneven, fix them. If there are any pets around you, be aware of where they are. Review your medicines with your doctor. Some medicines can make you feel dizzy. This can increase your chance of falling. Ask your doctor what other things that you can do to help prevent falls. This information is not intended to replace advice given to you by your health care provider. Make sure you discuss any questions you have with your health care provider. Document Released: 12/20/2008 Document Revised: 08/01/2015 Document Reviewed: 03/30/2014 Elsevier Interactive Patient Education  2017 [employer].

## 2022-06-12 ENCOUNTER — Encounter (HOSPITAL_COMMUNITY): Payer: Self-pay

## 2022-06-12 ENCOUNTER — Ambulatory Visit (HOSPITAL_COMMUNITY)
Admission: EM | Admit: 2022-06-12 | Discharge: 2022-06-12 | Disposition: A | Discharge status: Home / Self Care | Payer: Medicare HMO | Attending: Emergency Medicine | Admitting: Emergency Medicine

## 2022-06-12 DIAGNOSIS — R220 Localized swelling, mass and lump, head: Secondary | ICD-10-CM

## 2022-06-12 DIAGNOSIS — K047 Periapical abscess without sinus: Secondary | ICD-10-CM

## 2022-06-12 MED ORDER — LIDOCAINE VISCOUS HCL 2 % MT SOLN: 15.0000 mL | OROMUCOSAL | 0 refills | Status: DC | PRN

## 2022-06-12 NOTE — Discharge Instructions (Addendum)
Please continue to take your antibiotic as prescribed. Its important to take three times daily, every day, until the medicine is gone. It can take 3 full days of medicine for the antibiotic to start working. So far you have only taken about 1.  Continue tylenol every 4-6 hours for pain control.  You can try the mouth rinse - SWISH AND SPIT - to help with tooth pain  If symptoms are still worsening on Sunday/Monday, please return or call your dentist.

## 2022-06-12 NOTE — ED Provider Notes (Signed)
[facility]    CSN: [id number] Arrival date & time: 06/12/22  1006     History   Chief Complaint Chief Complaint  Patient presents with   Facial Swelling    HPI Lenon AhmadiCarol B Baise is a 72 y.o. female.  Here with 2 day history of facial swelling, dental pain. Saw dentist who started her on abx for "gum abscess". Med name unknown but it's prescribed TID. She has taken 4 doses so far. Only one full day (yesterday) Here because swelling has worsened  No fevers. Denies trouble breathing or swallowing  Taking tylenol for pain, last dose 9 hours ago  Past Medical History:  Diagnosis Date   Anemia    Arthritis    right finger   CHF (congestive heart failure)    Chronic renal disease, stage 3, moderately decreased glomerular filtration rate (GFR) between 30-59 mL/min/1.73 square meter    Diabetes mellitus    GERD (gastroesophageal reflux disease)    Hypertension     Patient Active Problem List   Diagnosis Date Noted   Acute metabolic encephalopathy 04/11/2020   Acute lower UTI 04/11/2020   DKA (diabetic ketoacidosis) 04/10/2020   Altered mental status 04/10/2020   CAP (community acquired pneumonia) 04/10/2020   Pyuria 04/10/2020   Stage 3 chronic kidney disease 12/01/2016   Frequent falls 04/28/2016   Right arm weakness 04/28/2016   Bilateral lower extremity edema 04/28/2016   Screening for colon cancer 04/28/2016   Abnormal urinalysis 09/13/2014   Chest pain 09/13/2014   Abdominal pain, recurrent 09/13/2014   Dehydration with hyponatremia 09/13/2014   Hyperkalemia 09/13/2014   Acute renal failure superimposed on stage 3 chronic kidney disease (HCC) 09/13/2014   Anemia 05/06/2014   Diabetic neuropathy, type II diabetes mellitus 05/06/2014   Obesity (BMI 30-39.9) 05/06/2014   Cellulitis of right foot 05/06/2014   Essential hypertension 03/07/2011   PERS HX NONCOMPLIANCE W/MED TX PRS HAZARDS HLTH 02/07/2009   Right arm pain 08/04/2006   Osteoarthritis  02/14/2006   Uncontrolled type 2 diabetes mellitus with hyperglycemia 01/11/2006    Past Surgical History:  Procedure Laterality Date   AMPUTATION Right 05/08/2014   Procedure: AMPUTATION RAY, right great toe;  Surgeon: Nadara MustardMarcus Duda V, MD;  Location: [facility];  Service: Orthopedics;  Laterality: Right;   arm surgery      CESAREAN SECTION     COLONOSCOPY     I & D EXTREMITY Right 05/08/2014   Procedure: IRRIGATION AND DEBRIDEMENT FOOT;  Surgeon: Nadara MustardMarcus Duda V, MD;  Location: [facility];  Service: Orthopedics;  Laterality: Right;   MEMBRANE PEEL Right 03/15/2018   Procedure: MEMBRANE PEEL;  Surgeon: Carmela RimaPatel, Narendra, MD;  [facility];  Service: Ophthalmology;  Laterality: Right;   PHOTOCOAGULATION WITH LASER Right 03/15/2018   Procedure: PHOTOCOAGULATION WITH LASER;  Surgeon: Carmela RimaPatel, Narendra, MD;  [facility];  Service: Ophthalmology;  Laterality: Right;   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 Authorizing Provider  Accu-Chek Softclix Lancets lancets Use 1-4 times daily as needed DX E11.9 04/25/20   Massie Maroon, FNP  acetaminophen (TYLENOL) 325 MG tablet Take 2 tablets (650 mg total) by mouth every 6 (six) hours as needed. 11/21/21   Sloan Leiter, DO  amLODipine (NORVASC) 10 MG tablet Take 1 tablet (10 mg total) by mouth daily. 04/20/22   Massie Maroon, FNP  atorvastatin (LIPITOR) 20 MG tablet Take 1 tablet (20 mg total) by mouth daily. 04/20/22   Massie Maroon, FNP  Blood  Glucose Monitoring Suppl (ACCU-CHEK GUIDE) w/Device KIT 1 Device by Does not apply route 4 (four) times daily. Patient not taking: Reported on 04/07/2022 04/25/20   Massie Maroon, FNP  cetirizine (ZYRTEC) 10 MG tablet Take 1 tablet (10 mg total) by mouth daily. 01/06/22   Massie Maroon, FNP  Continuous Blood Gluc Receiver (FREESTYLE LIBRE 3 READER) DEVI Use to check blood sugar continously as directed 05/05/22   Massie Maroon, FNP  Continuous Blood Gluc Sensor (FREESTYLE LIBRE 3 SENSOR) MISC Place 1 sensor on the skin every 14 days. Use to check glucose continuously 05/05/22   Massie Maroon, FNP  diclofenac Sodium (VOLTAREN) 1 % GEL Apply 4 g topically 4 (four) times daily. Patient not taking: Reported on 05/14/2022 07/01/21   Massie Maroon, FNP  Dulaglutide (TRULICITY) 1.5 MG/0.5ML SOPN Inject 1.5 mg into the skin once a week. 04/20/22   Massie Maroon, FNP  furosemide (LASIX) 20 MG tablet Take 1 tablet (20 mg total) by mouth daily. 04/20/22   Massie Maroon, FNP  gabapentin (NEURONTIN) 300 MG capsule Take 1 capsule by mouth twice daily 06/11/22   Ivonne Andrew, NP  glucose blood (ACCU-CHEK GUIDE) test strip Use as instructed Patient not taking: Reported on 04/07/2022 06/18/20   Massie Maroon, FNP  LANTUS SOLOSTAR 100 UNIT/ML Solostar Pen INJECT 15 UNITS SUBCUTANEOUSLY IN THE MORNING AND 10 ONCE DAILY AT BEDTIME 04/07/22   Massie Maroon, FNP  lidocaine (XYLOCAINE) 2 % solution Use as directed 15 mLs in the mouth or throat every 2 (two) hours as needed for mouth pain. 06/12/22   Ugochi Henzler, Lurena Joiner, PA-C  nystatin (MYCOSTATIN/NYSTOP) powder APPLY POWDER TOPICALLY TO AFFECTED AREA 4 TIMES DAILY 01/06/22   Massie Maroon, FNP  omeprazole (PRILOSEC) 20 MG capsule Take 1 capsule by mouth once daily 02/27/22   Massie Maroon, FNP  RELION PEN NEEDLE 31G/8MM 31G X 8 MM MISC USE AS DIRECTED 4 TIMES DAILY 12/20/17   Mike Gip, FNP  triamcinolone ointment (KENALOG) 0.5 % APPLY   OINTMENT TOPICALLY TO AFFECTED AREA TWICE DAILY 01/06/22   Massie Maroon, FNP  valsartan (DIOVAN) 80 MG tablet Take 1 tablet (80 mg total) by mouth daily. 05/14/22   Ivonne Andrew, NP    Family History Family History  Problem Relation Age of Onset   Diabetes Mother     Social History Social History   Tobacco Use   Smoking status: Never   Smokeless tobacco: Never  Vaping Use   Vaping Use: Never used  Substance Use Topics   Alcohol use: No   Drug use: No     Allergies   Tramadol, Adhesive [tape], Bactrim [sulfamethoxazole-trimethoprim], and Tylenol with codeine #3 [acetaminophen-codeine]  Review of Systems Review of Systems As per HPI  Physical Exam Triage Vital Signs No data found.  Updated Vital Signs BP (!) 158/83 (BP Location: Left Arm)   Pulse 79   Temp 98.8 F (37.1 C) (Oral)   Resp 16   SpO2 94%     Physical Exam Vitals and nursing note reviewed.  Constitutional:      General: She is not in acute distress.    Appearance: She is not ill-appearing.  HENT:     Head:      Comments: Right side facial swelling, tender. No swelling of lips or tongue. Swelling does not extend under the jaw.    Mouth/Throat:     Mouth: Mucous membranes are moist. No angioedema.     Dentition: Abnormal dentition. Dental tenderness present.     Pharynx: Oropharynx is clear. No posterior oropharyngeal erythema.      Comments: Tender upper right teeth, missing tooth as noted. No obvious swelling around gums or visualized abscess  Cardiovascular:     Rate and Rhythm: Normal rate and regular rhythm.     Heart sounds: Normal heart sounds.  Pulmonary:     Effort: Pulmonary effort is normal.     Breath sounds: Normal breath sounds.  Musculoskeletal:     Cervical back: Normal range of motion. No rigidity.  Lymphadenopathy:     Cervical: No cervical adenopathy.  Skin:    General: Skin is warm and dry.  Neurological:     Mental Status: She is alert and oriented to  person, place, and time.      UC Treatments / Results  Labs (all labs ordered are listed, but only abnormal results are displayed) Labs Reviewed - No data to display  EKG  Radiology No results found.  Procedures Procedures (including critical care time)  Medications Ordered in UC Medications - No data to display  Initial Impression / Assessment and Plan / UC Course  I have reviewed the triage vital signs and the nursing notes.  Pertinent labs & imaging results that were available during my care of the patient were reviewed by me and considered in my medical decision making (see chart for details).  Afebrile, well appearing Discussed has only been taking abx for one full day, needs to give it at least 3 days to really kick in. No red flags on exam. Advised to take tylenol as often as prescribed to keep pain down. Try lidocaine swish and spit for tooth pain. Strict return and ED precautions.   Final Clinical Impressions(s) / UC Diagnoses   Final diagnoses:  Facial swelling  Dental infection     Discharge Instructions      Please continue to take your antibiotic as prescribed. Its important to take three times daily, every day, until the medicine is gone. It can take 3 full days of medicine for the antibiotic to start working. So far you have only taken about 1.  Continue tylenol every 4-6 hours for pain control.  You can try the mouth rinse - SWISH AND SPIT - to help with tooth pain  If symptoms are still worsening on Sunday/Monday, please return or call your dentist.      ED Prescriptions     Medication Sig Dispense Auth. Provider   lidocaine (XYLOCAINE) 2 % solution  (Status: Discontinued) Use as directed 15 mLs in the mouth or throat as needed for mouth pain. 100 mL Maximo Spratling, PA-C   lidocaine (XYLOCAINE) 2 % solution Use as directed  15 mLs in the mouth or throat every 2 (two) hours as needed for mouth pain. 100 mL Laakea Pereira, Lurena Joiner, PA-C      PDMP not  reviewed this encounter.   Tydarius Yawn, Lurena Joiner, [place] 06/12/22 1103

## 2022-06-12 NOTE — ED Triage Notes (Signed)
Patient states she saw her dentist 2 days ago because she had an abscess to the right upper gum.  Patient states swelling has worsened. Patient states she was given antibiotics and has had Tylenol  and the last dose was at 0100 t day.

## 2022-06-17 ENCOUNTER — Encounter: Payer: Self-pay | Admitting: Podiatry

## 2022-06-17 ENCOUNTER — Ambulatory Visit (INDEPENDENT_AMBULATORY_CARE_PROVIDER_SITE_OTHER): Payer: Medicare HMO | Admitting: Podiatry

## 2022-06-17 DIAGNOSIS — B351 Tinea unguium: Secondary | ICD-10-CM

## 2022-06-17 DIAGNOSIS — M79674 Pain in right toe(s): Secondary | ICD-10-CM

## 2022-06-17 DIAGNOSIS — M79675 Pain in left toe(s): Secondary | ICD-10-CM | POA: Diagnosis not present

## 2022-06-17 NOTE — Progress Notes (Signed)
This patient presents to the office with chief complaint of long thick painful nails.  Patient says the nails are painful walking and wearing shoes.  This patient is unable to self treat.  This patient is unable to trim her nails since she is unable to reach her nails. She has amputation big toe right foot. She presents to the office for preventative foot care services.  General Appearance  Alert, conversant and in no acute stress.  Vascular  Dorsalis pedis and posterior tibial  pulses are palpable  bilaterally.  Capillary return is within normal limits  bilaterally. Temperature is within normal limits  bilaterally.  Neurologic  Senn-Weinstein monofilament wire test within normal limits  bilaterally. Muscle power within normal limits bilaterally.  Nails Thick disfigured discolored nails with subungual debris  from hallux to fifth toes left foot and 2-5 right foot. No evidence of bacterial infection or drainage bilaterally.  Orthopedic  No limitations of motion  feet .  No crepitus or effusions noted.  No bony pathology or digital deformities noted.  Skin  normotropic skin with no porokeratosis noted bilaterally.  No signs of infections or ulcers noted.     Onychomycosis  Nails .  Pain in right toes  Pain in left toes  Debridement of nails both feet followed trimming the nails with dremel tool.    RTC 3 months.   Helane Gunther DPM

## 2022-06-18 ENCOUNTER — Other Ambulatory Visit: Payer: Medicare HMO | Admitting: Pharmacist

## 2022-06-19 ENCOUNTER — Other Ambulatory Visit: Payer: Self-pay | Admitting: Family Medicine

## 2022-06-19 DIAGNOSIS — I10 Essential (primary) hypertension: Secondary | ICD-10-CM

## 2022-07-07 ENCOUNTER — Other Ambulatory Visit: Payer: Medicare HMO | Admitting: Pharmacist

## 2022-07-07 ENCOUNTER — Ambulatory Visit: Payer: Medicare HMO | Admitting: Family Medicine

## 2022-07-07 ENCOUNTER — Other Ambulatory Visit (HOSPITAL_COMMUNITY): Payer: Self-pay

## 2022-07-07 DIAGNOSIS — E1165 Type 2 diabetes mellitus with hyperglycemia: Secondary | ICD-10-CM

## 2022-07-07 NOTE — Patient Instructions (Signed)
[name],   It was great talking to you today!  Refill your Libre sensor and bring with you to our appointment Thursday.   Reduce Lantus to 16 units daily.   Take care!  Catie Eppie Gibson, PharmD, BCACP, CPP [facility] [phone]

## 2022-07-07 NOTE — Progress Notes (Unsigned)
07/07/2022 Name: Rachel Gonzalez MRN: [id number] DOB: 05/22/1950  Chief Complaint  Patient presents with   Medication Management   Diabetes   Hypertension   Hyperlipidemia    Rachel Gonzalez is a 72 y.o. year old female who presented for a telephone visit.   They were referred to the pharmacist by their PCP for assistance in managing diabetes, hypertension, and hyperlipidemia.   Subjective:  Care Team: Primary Care Provider: Massie Maroon, FNP ; Next Scheduled Visit: 07/09/22  Medication Access/Adherence  Current Pharmacy:  [facility] [address] [name] - [address] [name] [address] Phone: [phone] Fax: [phone]  [facility] - [place], [place] - [address] [place] [address] Phone: [phone] Fax: [phone]  [facility] - [facility] [address] [name] [address] Phone: [phone] Fax: [phone]   Patient reports affordability concerns with their medications: No  Patient reports access/transportation concerns to their pharmacy: No  Patient reports adherence concerns with their medications:  No     Diabetes:  Current medications: Trulicity 1.5 mg weekly, Lantus 25 units daily  Current glucose readings: reports she is using CGM, but doesn't think she is placing it correctly because it tends to fall off after a week. Notes she was having some episodes of hypoglycemia in 50-60s.   Patient reports hypoglycemic s/sx including dizziness, shakiness, sweating.   Hypertension:  Current medications: amlodipine 10 mg daily, furosemide 20 mg PRN- taking daily Previously on: historically on lisinopril, no documentation as to why it was discontinued.    Patient does not have a validated, automated, upper arm home BP cuff, her daughter is going to help order a home BP machine from her OTC benefits. They plan to do that  this week    Hyperlipidemia/ASCVD Risk Reduction  Current lipid lowering medications: atorvastatin 20 mg daily  Objective:  Lab Results  Component Value Date   HGBA1C 15.0 (A) 04/07/2022    Lab Results  Component Value Date   CREATININE 1.55 (H) 04/07/2022   BUN 26 (H) 04/07/2022   NA 136 04/07/2022   K 3.7 04/07/2022   CL 101 04/07/2022   CO2 27 04/07/2022    Lab Results  Component Value Date   CHOL 156 06/26/2019   HDL 78 06/26/2019   LDLCALC 68 06/26/2019   LDLDIRECT 74 08/10/2007   TRIG 43 06/26/2019   CHOLHDL 2.0 06/26/2019    Medications Reviewed Today     Reviewed by Alden Hipp, RPH-CPP (Pharmacist) on 07/07/22 at 1535  Med List Status: <None>   Medication Order Taking? Sig Documenting Provider Last Dose Status Informant  Accu-Chek Softclix Lancets lancets [id number]  Use 1-4 times daily as needed DX E11.9 Massie Maroon, FNP  Active Self  acetaminophen (TYLENOL) 325 MG tablet [id number]  Take 2 tablets (650 mg total) by mouth every 6 (six) hours as needed. Sloan Leiter, DO  Active            Med Note Sherilyn Cooter, [facility]   Tue Apr 07, 2022 10:10 AM) Prn   amLODipine (NORVASC) 10 MG tablet [id number] Yes TAKE 1 TABLET EVERY DAY Massie Maroon, FNP Taking Active   atorvastatin (LIPITOR) 20 MG tablet [id number] Yes Take 1 tablet (20 mg total) by mouth daily. Massie Maroon, FNP Taking Active   Blood Glucose Monitoring Suppl (ACCU-CHEK GUIDE) w/Device KIT [id number]  1 Device by Does not  apply route 4 (four) times daily.  Patient not taking: Reported on 04/07/2022   Massie Maroon, FNP  Active Self  cetirizine (ZYRTEC) 10 MG tablet [id number]  Take 1 tablet (10 mg total) by mouth daily. Massie Maroon, FNP  Active   Continuous Blood Gluc Receiver (FREESTYLE LIBRE 3 READER) DEVI [id number]  Use to check blood sugar continously as directed Massie Maroon, FNP  Active   Continuous Blood Gluc Sensor (FREESTYLE LIBRE 3 SENSOR) [place] [id number] Yes  Place 1 sensor on the skin every 14 days. Use to check glucose continuously Massie Maroon, FNP Taking Active   diclofenac Sodium (VOLTAREN) 1 % GEL [id number]  Apply 4 g topically 4 (four) times daily.  Patient not taking: Reported on 05/14/2022   Massie Maroon, FNP  Active            Med Note Clearance Coots, Natalyah Cummiskey T   Tue May 05, 2022 10:06 AM)    Dulaglutide (TRULICITY) 1.5 MG/0.5ML Namon Cirri [id number] Yes Inject 1.5 mg into the skin once a week. Massie Maroon, FNP Taking Active   furosemide (LASIX) 20 MG tablet [id number] Yes Take 1 tablet (20 mg total) by mouth daily. Massie Maroon, FNP Taking Active   gabapentin (NEURONTIN) 300 MG capsule [id number] Yes Take 1 capsule by mouth twice daily Ivonne Andrew, NP Taking Active   glucose blood (ACCU-CHEK GUIDE) test strip [id number]  Use as instructed  Patient not taking: Reported on 04/07/2022   Massie Maroon, FNP  Active Self  insulin NPH Human (NOVOLIN N) injection 20 Units [id number]   Massie Maroon, FNP  Consider Medication Status and Discontinue (Completed Course)   LANTUS SOLOSTAR 100 UNIT/ML Solostar Pen [id number] Yes INJECT 15 UNITS SUBCUTANEOUSLY IN THE MORNING AND 10 ONCE DAILY AT BEDTIME Massie Maroon, FNP Taking Active   lidocaine (XYLOCAINE) 2 % solution [id number]  Use as directed 15 mLs in the mouth or throat every 2 (two) hours as needed for mouth pain. Rising, Lurena Joiner, PA-C  Active   nystatin (MYCOSTATIN/NYSTOP) powder [id number]  APPLY POWDER TOPICALLY TO AFFECTED AREA 4 TIMES DAILY Massie Maroon, FNP  Active   omeprazole (PRILOSEC) 20 MG capsule [id number] Yes Take 1 capsule by mouth once daily Massie Maroon, FNP Taking Active   RELION PEN NEEDLE 31G/8MM 31G X 8 MM MISC [id number]  USE AS DIRECTED 4 TIMES DAILY Mike Gip, FNP  Active Self  triamcinolone ointment (KENALOG) 0.5 % [id number]  APPLY  OINTMENT TOPICALLY TO AFFECTED AREA TWICE DAILY Massie Maroon, FNP  Active   valsartan (DIOVAN) 80 MG tablet  [id number] Yes Take 1 tablet (80 mg total) by mouth daily. Ivonne Andrew, NP Taking Active               Assessment/Plan:   Diabetes: - Currently uncontrolled but improving.  - Reviewed long term cardiovascular and renal outcomes of uncontrolled blood sugar - Reviewed goal A1c, goal fasting, and goal 2 hour post prandial glucose - Recommend to reduce Lantus to 16 units daily due to hypoglycemia.  - Will meet with patient on Thursday while in office to re-educate on CGM.  - Recommend to continue Trulicity 1.5 mg weekly  Hypertension: - Currently unknown control - Reviewed long term cardiovascular and renal outcomes of uncontrolled blood pressure - Reviewed appropriate blood pressure monitoring technique and reviewed goal blood pressure. Recommended to check home blood pressure and heart rate periodically once upper arm BP machine is purchased - Recommend  to continue current regimen at this time  Hyperlipidemia/ASCVD Risk Reduction: - Currently due to be checked.  - Recommend to continue current regimen at this time  Follow Up Plan: PCP in 2 days, phone call with PharmD in ~ 6 weeks  Catie Eppie Gibson, PharmD, BCACP, CPP [facility] [phone]

## 2022-07-08 MED ORDER — LANTUS SOLOSTAR 100 UNIT/ML ~~LOC~~ SOPN: 16.0000 [IU] | PEN_INJECTOR | Freq: Every day | SUBCUTANEOUS | 1 refills | Status: DC

## 2022-07-09 ENCOUNTER — Encounter: Payer: Self-pay | Admitting: Family Medicine

## 2022-07-09 ENCOUNTER — Other Ambulatory Visit (HOSPITAL_COMMUNITY): Payer: Self-pay

## 2022-07-09 ENCOUNTER — Ambulatory Visit (INDEPENDENT_AMBULATORY_CARE_PROVIDER_SITE_OTHER): Payer: Medicare HMO | Admitting: Family Medicine

## 2022-07-09 ENCOUNTER — Ambulatory Visit: Payer: Medicare HMO

## 2022-07-09 VITALS — BP 153/64 | HR 77 | Temp 97.1°F | Wt 213.6 lb

## 2022-07-09 DIAGNOSIS — E1140 Type 2 diabetes mellitus with diabetic neuropathy, unspecified: Secondary | ICD-10-CM

## 2022-07-09 DIAGNOSIS — N1831 Chronic kidney disease, stage 3a: Secondary | ICD-10-CM

## 2022-07-09 DIAGNOSIS — I10 Essential (primary) hypertension: Secondary | ICD-10-CM

## 2022-07-09 DIAGNOSIS — Z89411 Acquired absence of right great toe: Secondary | ICD-10-CM | POA: Diagnosis not present

## 2022-07-09 DIAGNOSIS — I130 Hypertensive heart and chronic kidney disease with heart failure and stage 1 through stage 4 chronic kidney disease, or unspecified chronic kidney disease: Secondary | ICD-10-CM | POA: Diagnosis not present

## 2022-07-09 DIAGNOSIS — E1165 Type 2 diabetes mellitus with hyperglycemia: Secondary | ICD-10-CM | POA: Diagnosis not present

## 2022-07-09 DIAGNOSIS — I509 Heart failure, unspecified: Secondary | ICD-10-CM | POA: Diagnosis not present

## 2022-07-09 LAB — POCT GLYCOSYLATED HEMOGLOBIN (HGB A1C): Hemoglobin A1C: 10.7 % — AB (ref 4.0–5.6)

## 2022-07-09 NOTE — Patient Instructions (Addendum)
Today, A1c is 10.6, which is much improved from previous.  Follow medication regimen exactly as prescribed.  Continue to check blood sugars.  Bring your blood pressure cuff to your follow-up appointment.  Also, recommend a carbohydrate modified diet divided over small meals throughout the day. Do not forget to ask your human resources department for FMLA forms.+  Practice good sleep hygiene.  Stick to a sleep schedule, even on weekends. Exercise is great, but not too late in the day Avoid alcoholic drinks before bed Avoid large meals and beverages late before bed Don't take naps after 3 pm. Keep power naps less than 1 hour.  Relax before bed.  Take a hot bath before bed.  Have a good sleeping environment. Get rid of anything in your bedroom that might distract you from sleep.  Adopt good sleeping posture.

## 2022-07-09 NOTE — Progress Notes (Signed)
Established Patient Office Visit  Subjective   Patient ID: Rachel Gonzalez, female    DOB: 06-May-1950  Age: 72 y.o. MRN: [id number]  Chief Complaint  Patient presents with   Diabetes    Follow up    Seen is a very pleasant 72 year old female with a medical history significant for uncontrolled diabetes mellitus, chronic kidney disease stage III, hypertension, hyperlipidemia, and obesity that presents for follow-up of her chronic conditions.  During most recent encounter, hemoglobin A1c was greater than 15.  Since that time, patient's insulin and GLP-1 has been adjusted.  Patient also attempts to follow a carbohydrate modified diet divided over small meals throughout the day.  Patient is no longer followed by dietitian.  She has been lost to follow-up with her podiatrist.  Patient sustained an injury 1 year ago and has been undergoing physical therapy for bilateral hip and bilateral knee pain.  Patient requires the assistance of a walker for ambulation.  She is under the care of orthopedics for management of injury and chronic pain. Patient has been unable to exercise consistently due to increased pain.  Patient has a history of hypertension.  She says that she has been unable to check blood pressure at home due to not having an appropriate cuff.  Patient endorses some lower extremity swelling.  Denies any chest pain, shortness of breath, or orthopnea.  Diabetes She has type 2 diabetes mellitus. Her disease course has been improving. Associated symptoms include polyuria. Pertinent negatives for diabetes include no blurred vision, no chest pain, no fatigue, no foot paresthesias, no polydipsia and no polyphagia. Symptoms are improving. Diabetic complications include nephropathy. Risk factors for coronary artery disease include dyslipidemia, diabetes mellitus, obesity, sedentary lifestyle and hypertension. Current diabetic treatment includes diet and insulin injections. She is following a generally  unhealthy diet. She never participates in exercise. She sees a podiatrist.Eye exam is not current.  Hypertension This is a chronic problem. The problem is controlled. Pertinent negatives include no blurred vision, chest pain, neck pain, orthopnea or palpitations. Compliance problems include diet and exercise.  Hypertensive end-organ damage includes kidney disease. There is no history of CAD/MI.    Patient Active Problem List   Diagnosis Date Noted   Pain due to onychomycosis of toenails of both feet 06/17/2022   Acute metabolic encephalopathy 04/11/2020   Acute lower UTI 04/11/2020   DKA (diabetic ketoacidosis) (HCC) 04/10/2020   Altered mental status 04/10/2020   CAP (community acquired pneumonia) 04/10/2020   Pyuria 04/10/2020   Stage 3 chronic kidney disease (HCC) 12/01/2016   Frequent falls 04/28/2016   Right arm weakness 04/28/2016   Bilateral lower extremity edema 04/28/2016   Screening for colon cancer 04/28/2016   Abnormal urinalysis 09/13/2014   Chest pain 09/13/2014   Abdominal pain, recurrent 09/13/2014   Dehydration with hyponatremia 09/13/2014   Hyperkalemia 09/13/2014   Acute renal failure superimposed on stage 3 chronic kidney disease (HCC) 09/13/2014   Anemia 05/06/2014   Diabetic neuropathy, type II diabetes mellitus (HCC) 05/06/2014   Obesity (BMI 30-39.9) 05/06/2014   Cellulitis of right foot 05/06/2014   Essential hypertension 03/07/2011   PERS HX NONCOMPLIANCE W/MED TX PRS HAZARDS HLTH 02/07/2009   Right arm pain 08/04/2006   Osteoarthritis 02/14/2006   Uncontrolled type 2 diabetes mellitus with hyperglycemia (HCC) 01/11/2006   Past Medical History:  Diagnosis Date   Anemia    Arthritis    right finger   CHF (congestive heart failure) (HCC)    Chronic renal disease,  stage 3, moderately decreased glomerular filtration rate (GFR) between 30-59 mL/min/1.73 square meter (HCC)    Diabetes mellitus    GERD (gastroesophageal reflux disease)    Hypertension     Past Surgical History:  Procedure Laterality Date   AMPUTATION Right 05/08/2014   Procedure: AMPUTATION RAY, right great toe;  Surgeon: Nadara Mustard, MD;  Location: [facility] OR;  Service: Orthopedics;  Laterality: Right;   arm surgery      CESAREAN SECTION     COLONOSCOPY     I & D EXTREMITY Right 05/08/2014   Procedure: IRRIGATION AND DEBRIDEMENT FOOT;  Surgeon: Nadara Mustard, MD;  Location: [facility] OR;  Service: Orthopedics;  Laterality: Right;   MEMBRANE PEEL Right 03/15/2018   Procedure: MEMBRANE PEEL;  Surgeon: Carmela Rima, MD;  Location: 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/2018   Procedure: RIGHT EYE COMPLEX RETINA DETACHMENT VITRECTOMY MEMBRANE PEEL WITH AIR,GAS,SILICONE OIL PHOTOCOAGULATION;  Surgeon: Carmela Rima, MD;  Location: [facility] OR;  Service: Ophthalmology;  Laterality: Right;   Social History   Tobacco Use   Smoking status: Never   Smokeless tobacco: Never  Vaping Use   Vaping Use: Never used  Substance Use Topics   Alcohol use: No   Drug use: No   Social History   Socioeconomic History   Marital status: Married    Spouse name: Not on file   Number of children: Not on file   Years of education: Not on file   Highest education level: Not on file  Occupational History   Not on file  Tobacco Use   Smoking status: Never   Smokeless tobacco: Never  Vaping Use   Vaping Use: Never used  Substance and Sexual Activity   Alcohol use: No   Drug use: No   Sexual activity: Not on file  Other Topics Concern   Not on file  Social History Narrative   From [place].   Married.   Three children, 9 grandchildren.   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   Travels to [place], [address]   Social Determinants of Health   Financial Resource Strain: Low Risk  (06/11/2022)   Overall Financial Resource Strain (CARDIA)    Difficulty of Paying Living Expenses: Not hard at all  Food Insecurity: No Food Insecurity (06/11/2022)   Hunger Vital Sign    Worried About Running Out of Food in the Last Year: Never true    Ran Out of Food in the Last Year: Never true  Transportation Needs: No Transportation Needs (06/11/2022)   PRAPARE - Administrator, Civil Service (Medical): No    Lack of Transportation (Non-Medical): No  Physical Activity: Sufficiently Active (06/11/2022)   Exercise Vital Sign    Days of Exercise per Week: 3 days    Minutes of Exercise per Session: 60 min  Stress: No Stress Concern Present (06/11/2022)   [employer] of Occupational Health - Occupational Stress Questionnaire    Feeling of Stress : Not at all  Social Connections: Socially Integrated (06/11/2022)   Social Connection and Isolation Panel [NHANES]    Frequency of Communication with Friends and Family: More than three times a week    Frequency of Social Gatherings with  Friends and Family: More than three times a week    Attends Religious Services: More than 4 times per year    Active Member of Clubs or Organizations: Yes    Attends Engineer, structural: More than 4 times per year    Marital Status: Married  Catering manager Violence: Not At Risk (06/11/2022)   Humiliation, Afraid, Rape, and Kick questionnaire    Fear of Current or Ex-Partner: No    Emotionally Abused: No    Physically Abused: No    Sexually Abused: No   Family Status  Relation Name Status   Mother  Deceased   Father  Deceased   Family History  Problem Relation Age of Onset   Diabetes Mother    Allergies  Allergen Reactions   Tramadol Nausea And Vomiting   Adhesive [Tape] Itching   Bactrim [Sulfamethoxazole-Trimethoprim] Other (See Comments)    AKI and hyperkalemia    Tylenol With Codeine #3  [Acetaminophen-Codeine] Diarrhea and Itching    Only allergic to Codeine, patient reports no allergy to Tylenol.      Review of Systems  Constitutional: Negative.  Negative for chills, fatigue and fever.  HENT: Negative.    Eyes: Negative.  Negative for blurred vision.  Cardiovascular: Negative.  Negative for chest pain, palpitations and orthopnea.  Gastrointestinal: Negative.   Genitourinary: Negative.   Musculoskeletal:  Positive for back pain and joint pain. Negative for neck pain.  Skin: Negative.   Neurological: Negative.   Endo/Heme/Allergies:  Negative for polydipsia and polyphagia.  Psychiatric/Behavioral: Negative.        Objective:     BP (!) 153/64   Pulse 77   Temp (!) 97.1 F (36.2 C)   Wt 213 lb 9.6 oz (96.9 kg)   SpO2 99%   BMI 36.66 kg/m  BP Readings from Last 3 Encounters:  07/09/22 (!) 153/64  06/12/22 (!) 158/83  05/14/22 (!) 154/73   Wt Readings from Last 3 Encounters:  07/09/22 213 lb 9.6 oz (96.9 kg)  06/11/22 204 lb (92.5 kg)  04/07/22 204 lb 12.8 oz (92.9 kg)      Physical Exam Constitutional:      Appearance: She is obese.  Eyes:     Pupils: Pupils are equal, round, and reactive to light.  Cardiovascular:     Rate and Rhythm: Normal rate and regular rhythm.  Pulmonary:     Effort: Pulmonary effort is normal.  Abdominal:     General: Abdomen is flat. Bowel sounds are normal.  Musculoskeletal:        General: Normal range of motion.  Skin:    General: Skin is warm.  Neurological:     General: No focal deficit present.     Mental Status: She is alert. Mental status is at baseline.  Psychiatric:        Mood and Affect: Mood normal.        Behavior: Behavior normal.        Thought Content: Thought content normal.        Judgment: Judgment normal.      No results found for any visits on 07/09/22.  Last CBC Lab Results  Component Value Date   WBC 5.2 04/07/2022   HGB 10.6 (L) 04/07/2022   HCT 33.3 (L) 04/07/2022   MCV  84.5 04/07/2022   MCH 26.9 04/07/2022   RDW 13.0 04/07/2022   PLT 201 04/07/2022   Last metabolic panel Lab Results  Component Value Date   GLUCOSE 346 (H)  04/07/2022   NA 136 04/07/2022   K 3.7 04/07/2022   CL 101 04/07/2022   CO2 27 04/07/2022   BUN 26 (H) 04/07/2022   CREATININE 1.55 (H) 04/07/2022   GFRNONAA 36 (L) 04/07/2022   CALCIUM 8.7 (L) 04/07/2022   PHOS 3.4 04/10/2020   PROT 6.4 (L) 04/07/2022   ALBUMIN 3.4 (L) 04/07/2022   LABGLOB 3.0 01/06/2022   AGRATIO 1.3 01/06/2022   BILITOT 0.5 04/07/2022   ALKPHOS 202 (H) 04/07/2022   AST 12 (L) 04/07/2022   ALT 11 04/07/2022   ANIONGAP 8 04/07/2022   Last lipids Lab Results  Component Value Date   CHOL 156 06/26/2019   HDL 78 06/26/2019   LDLCALC 68 06/26/2019   LDLDIRECT 74 08/10/2007   TRIG 43 06/26/2019   CHOLHDL 2.0 06/26/2019   Last hemoglobin A1c Lab Results  Component Value Date   HGBA1C 15.0 (A) 04/07/2022   Last thyroid functions Lab Results  Component Value Date   TSH 0.838 04/10/2020   Last vitamin D No results found for: "25OHVITD2", "25OHVITD3", "VD25OH" Last vitamin B12 and Folate Lab Results  Component Value Date   VITAMINB12 384 05/06/2014   FOLATE 17.4 05/06/2014      The ASCVD Risk score (Arnett DK, et al., 2019) failed to calculate for the following reasons:   Cannot find a previous HDL lab   Cannot find a previous total cholesterol lab    Assessment & Plan:   Problem List Items Addressed This Visit       Cardiovascular and Mediastinum   Essential hypertension     Endocrine   Uncontrolled type 2 diabetes mellitus with hyperglycemia (HCC) - Primary   Relevant Orders   POCT glycosylated hemoglobin (Hb A1C)   Comprehensive metabolic panel     Genitourinary   Stage 3 chronic kidney disease (HCC)   Other Visit Diagnoses     Diabetic neuropathy, painful (HCC)         1. Uncontrolled type 2 diabetes mellitus with hyperglycemia (HCC) Patient's hemoglobin A1c is much  improved from previous.  Currently, hemoglobin A1c is 10.7, from 15 3 months prior.  Patient congratulated on the efforts she has made to improve her diabetes.  She will continue to work with pharmacist on medication management. - POCT glycosylated hemoglobin (Hb A1C) - Comprehensive metabolic panel  2. Essential hypertension BP (!) 153/64   Pulse 77   Temp (!) 97.1 F (36.2 C)   Wt 213 lb 9.6 oz (96.9 kg)   SpO2 99%   BMI 36.66 kg/m  - Continue medication, monitor blood pressure at home. Continue DASH diet.  Reminder to go to the ER if any CP, SOB, nausea, dizziness, severe HA, changes vision/speech, left arm numbness and tingling and jaw pain.    3. Stage 3a chronic kidney disease (HCC) Advised patient to schedule appointment with nephrology team, first available.  4. Diabetic neuropathy, painful (HCC)   Return in about 3 months (around 10/09/2022) for diabetes, hyperlipidemia.    Nolon Nations  APRN, MSN, FNP-C Patient Care [facility] [address] [place], [name] [address] [phone]

## 2022-07-10 LAB — COMPREHENSIVE METABOLIC PANEL
ALT: 10 IU/L (ref 0–32)
AST: 15 IU/L (ref 0–40)
Albumin/Globulin Ratio: 1.5 (ref 1.2–2.2)
Albumin: 4.1 g/dL (ref 3.9–4.9)
Alkaline Phosphatase: 176 IU/L — ABNORMAL HIGH (ref 44–121)
BUN/Creatinine Ratio: 20 (ref 12–28)
BUN: 32 mg/dL — ABNORMAL HIGH (ref 8–27)
Bilirubin Total: <0.2 mg/dL (ref 0.0–1.2)
CO2: 21 mmol/L (ref 20–29)
Calcium: 9.1 mg/dL (ref 8.7–10.3)
Chloride: 107 mmol/L — ABNORMAL HIGH (ref 96–106)
Creatinine, Ser: 1.6 mg/dL — ABNORMAL HIGH (ref 0.57–1.00)
Globulin, Total: 2.7 g/dL (ref 1.5–4.5)
Glucose: 189 mg/dL — ABNORMAL HIGH (ref 70–99)
Potassium: 4.7 mmol/L (ref 3.5–5.2)
Sodium: 143 mmol/L (ref 134–144)
Total Protein: 6.8 g/dL (ref 6.0–8.5)
eGFR: 34 mL/min/{1.73_m2} — ABNORMAL LOW (ref >59)

## 2022-07-14 ENCOUNTER — Telehealth: Payer: Self-pay | Admitting: Pharmacist

## 2022-07-14 ENCOUNTER — Other Ambulatory Visit: Payer: Medicare HMO | Admitting: Pharmacist

## 2022-07-14 NOTE — Progress Notes (Signed)
Attempted to contact patient for scheduled appointment for medication management. Left HIPAA compliant message for patient to return my call at their convenience.    Catie T. Abaigeal Moomaw, PharmD, BCACP, CPP [facility] [phone]  

## 2022-07-17 ENCOUNTER — Other Ambulatory Visit: Payer: Medicare HMO | Admitting: Pharmacist

## 2022-07-21 ENCOUNTER — Other Ambulatory Visit: Payer: Self-pay

## 2022-07-26 ENCOUNTER — Other Ambulatory Visit: Payer: Self-pay | Admitting: Family Medicine

## 2022-07-26 DIAGNOSIS — K219 Gastro-esophageal reflux disease without esophagitis: Secondary | ICD-10-CM

## 2022-07-31 ENCOUNTER — Other Ambulatory Visit: Payer: Self-pay | Admitting: Nurse Practitioner

## 2022-07-31 ENCOUNTER — Telehealth: Payer: Self-pay | Admitting: Pharmacist

## 2022-07-31 ENCOUNTER — Other Ambulatory Visit: Payer: Medicare HMO | Admitting: Pharmacist

## 2022-07-31 ENCOUNTER — Other Ambulatory Visit: Payer: Self-pay

## 2022-07-31 DIAGNOSIS — K219 Gastro-esophageal reflux disease without esophagitis: Secondary | ICD-10-CM

## 2022-07-31 DIAGNOSIS — E1140 Type 2 diabetes mellitus with diabetic neuropathy, unspecified: Secondary | ICD-10-CM

## 2022-07-31 MED ORDER — FREESTYLE LIBRE 3 SENSOR MISC: 3 refills | Status: DC

## 2022-07-31 MED ORDER — GABAPENTIN 300 MG PO CAPS
300.0000 mg | ORAL_CAPSULE | Freq: Two times a day (BID) | ORAL | 0 refills | Status: DC
Start: 2022-07-31 — End: 2023-03-02

## 2022-07-31 MED ORDER — VALSARTAN 80 MG PO TABS: 80.0000 mg | ORAL_TABLET | Freq: Every day | ORAL | 1 refills | Status: DC

## 2022-07-31 NOTE — Progress Notes (Unsigned)
Patient requests refills on omeprazole, valsartan, and gabapentin to [facility].

## 2022-07-31 NOTE — Progress Notes (Unsigned)
07/31/2022 Name: Rachel Gonzalez MRN: [id number] DOB: 02-02-1951  Chief Complaint  Patient presents with   Medication Management   Diabetes    Rachel Gonzalez is a 72 y.o. year old female who presented for a telephone visit.   They were referred to the pharmacist by their PCP for assistance in managing diabetes, hypertension, and hyperlipidemia.   Subjective:  Care Team: Primary Care Provider: Massie Maroon, FNP ; Next Scheduled Visit: 10/08/22  Medication Access/Adherence  Current Pharmacy:  [facility] [address], [name] - [address] [name] [address] Phone: [phone] Fax: [phone]  [facility] - [place], [place] - [address] [place] [address] Phone: [phone] Fax: [phone]  [facility] [address] [name] [address] Phone: [phone] Fax: [phone]   Patient reports affordability concerns with their medications: No  Patient reports access/transportation concerns to their pharmacy: No  Patient reports adherence concerns with their medications:  No     Diabetes:  Current medications: Trulicity 1.5 mg weekly, Lantus 20 units Medications tried in the past: renal function inappropriate for metformin   Current glucose readings: has not been checking. Notes she wore the [facility] for 2 weeks, but had a difficult time getting it off her arm and her arm still hurts. Has not done any fingerstick blood sugars and is not sure where her machine is.   Patient denies hypoglycemic s/sx including dizziness, shakiness, sweating. Patient denies hyperglycemic symptoms including polyuria, polydipsia, polyphagia, nocturia, neuropathy, blurred vision.  Reports improvement in symptoms of sluggishness (likely was hypoglycemia) and improvement in thirst and hunger  Hypertension:  Current medications: valsartan 80 mg  daily, amlodipine 10 mg daily  Patient has a validated, automated, upper arm home BP cuff Current blood pressure readings readings: has not started checking yet   Hyperlipidemia/ASCVD Risk Reduction  Current lipid lowering medications: atorvastatin 20 mg daily   Objective:  Lab Results  Component Value Date   HGBA1C 10.7 (A) 07/09/2022    Lab Results  Component Value Date   CREATININE 1.60 (H) 07/09/2022   BUN 32 (H) 07/09/2022   NA 143 07/09/2022   K 4.7 07/09/2022   CL 107 (H) 07/09/2022   CO2 21 07/09/2022    Lab Results  Component Value Date   CHOL 156 06/26/2019   HDL 78 06/26/2019   LDLCALC 68 06/26/2019   LDLDIRECT 74 08/10/2007   TRIG 43 06/26/2019   CHOLHDL 2.0 06/26/2019    Medications Reviewed Today     Reviewed by Alden Hipp, RPH-CPP (Pharmacist) on 07/31/22 at 1306  Med List Status: <None>   Medication Order Taking? Sig Documenting Provider Last Dose Status Informant  Accu-Chek Softclix Lancets lancets [id number]  Use 1-4 times daily as needed DX E11.9 Massie Maroon, FNP  Active Self  acetaminophen (TYLENOL) 325 MG tablet [id number]  Take 2 tablets (650 mg total) by mouth every 6 (six) hours as needed. Sloan Leiter, DO  Active            Med Note Sherilyn Cooter, [facility]   Tue Apr 07, 2022 10:10 AM) Prn   amLODipine (NORVASC) 10 MG tablet [id number] Yes TAKE 1 TABLET EVERY DAY Massie Maroon, FNP Taking Active   atorvastatin (LIPITOR) 20 MG tablet [id number] Yes Take 1 tablet (20 mg total) by mouth daily. Massie Maroon, FNP Taking Active   Blood Glucose Monitoring Suppl (  ACCU-CHEK GUIDE) w/Device KIT [id number] No 1 Device by Does not apply route 4 (four) times daily.  Patient not taking: Reported on 04/07/2022   Massie Maroon, FNP Not Taking Active Self  cetirizine (ZYRTEC) 10 MG tablet [id number]  Take 1 tablet (10 mg total) by mouth daily. Massie Maroon, FNP  Active   Continuous Blood Gluc Receiver (FREESTYLE LIBRE 3 READER) DEVI  [id number] Yes Use to check blood sugar continously as directed Massie Maroon, FNP Taking Active   Continuous Glucose Sensor (FREESTYLE LIBRE 3 SENSOR) [place] [id number] Yes Place 1 sensor on the skin every 14 days. Use to check glucose continuously Massie Maroon, FNP Taking Active   diclofenac Sodium (VOLTAREN) 1 % GEL [id number]  Apply 4 g topically 4 (four) times daily.  Patient not taking: Reported on 05/14/2022   Massie Maroon, FNP  Active            Med Note Clearance Coots, Merin Borjon T   Tue May 05, 2022 10:06 AM)    Dulaglutide (TRULICITY) 1.5 MG/0.5ML Namon Cirri [id number] Yes Inject 1.5 mg into the skin once a week. Massie Maroon, FNP Taking Active   furosemide (LASIX) 20 MG tablet [id number] Yes Take 1 tablet (20 mg total) by mouth daily. Massie Maroon, FNP Taking Active   gabapentin (NEURONTIN) 300 MG capsule [id number] Yes Take 1 capsule by mouth twice daily Ivonne Andrew, NP Taking Active   glucose blood (ACCU-CHEK GUIDE) test strip [id number]  Use as instructed  Patient not taking: Reported on 04/07/2022   Massie Maroon, FNP  Active Self  insulin glargine (LANTUS SOLOSTAR) 100 UNIT/ML Solostar Pen [id number] Yes Inject 16 Units into the skin daily. Massie Maroon, FNP Taking Active   insulin NPH Human (NOVOLIN N) injection 20 Units [id number]   Massie Maroon, FNP  Active   lidocaine (XYLOCAINE) 2 % solution [id number]  Use as directed 15 mLs in the mouth or throat every 2 (two) hours as needed for mouth pain.  Patient not taking: Reported on 07/09/2022   Rising, Lurena Joiner, [place]  Active   nystatin (MYCOSTATIN/NYSTOP) powder [id number]  APPLY POWDER TOPICALLY TO AFFECTED AREA 4 TIMES DAILY Massie Maroon, FNP  Active   omeprazole (PRILOSEC) 20 MG capsule [id number] Yes Take 1 capsule by mouth once daily Massie Maroon, FNP Taking Active   RELION PEN NEEDLE 31G/8MM 31G X 8 MM MISC [id number]  USE AS DIRECTED 4 TIMES DAILY Mike Gip, FNP  Active Self  triamcinolone ointment  (KENALOG) 0.5 % [id number]  APPLY  OINTMENT TOPICALLY TO AFFECTED AREA TWICE DAILY Massie Maroon, FNP  Active   valsartan (DIOVAN) 80 MG tablet [id number] Yes Take 1 tablet (80 mg total) by mouth daily. Ivonne Andrew, NP Taking Active               Assessment/Plan:   Diabetes: - Currently uncontrolled but improved - Reviewed long term cardiovascular and renal outcomes of uncontrolled blood sugar - Reviewed goal A1c, goal fasting, and goal 2 hour post prandial glucose - Recommend to increase Trulicity dose. However, Trulicity 3 mg is on backorder right now with no anticipation of when it will return to stock. Recommend to change to Ozempic 0.5 mg weekly and titrate as tolerated. Discussed with patient, given greater potency with Ozempic vs Trulicity. Patient is in agreement. Counseled on differences in pen and dosing. Will discuss with PCP.  - Recommend to check glucose using CGM. Patient notes she would rather try  CGM again than use fingersticks.   Hypertension: - Currently uncontrolled - Reviewed long term cardiovascular and renal outcomes of uncontrolled blood pressure - Reviewed appropriate blood pressure monitoring technique and reviewed goal blood pressure. Recommended to check home blood pressure and heart rate daily, document, and provide at future visits - Recommend to continue current regimen at this time  Hyperlipidemia/ASCVD Risk Reduction: - Currently uncontrolled but likely related to adherence - Recommend to continue current regimen at this time  Follow Up Plan: phone call in 4 weeks  Catie Eppie Gibson, PharmD, BCACP, CPP [facility] [phone]

## 2022-08-04 MED ORDER — OZEMPIC (0.25 OR 0.5 MG/DOSE) 2 MG/3ML ~~LOC~~ SOPN: 0.5000 mg | PEN_INJECTOR | SUBCUTANEOUS | 2 refills | Status: DC

## 2022-08-12 ENCOUNTER — Other Ambulatory Visit: Payer: Self-pay | Admitting: Family Medicine

## 2022-08-12 DIAGNOSIS — Z9109 Other allergy status, other than to drugs and biological substances: Secondary | ICD-10-CM

## 2022-08-28 ENCOUNTER — Other Ambulatory Visit: Payer: Medicare HMO | Admitting: Pharmacist

## 2022-08-28 ENCOUNTER — Telehealth: Payer: Self-pay | Admitting: Pharmacist

## 2022-08-28 NOTE — Progress Notes (Unsigned)
Attempted to contact patient for scheduled appointment for medication management. Left HIPAA compliant message for patient to return my call at their convenience.   Will send MyChart  Catie Eppie Gibson, PharmD, BCACP, CPP Clinical Pharmacist [facility] [phone]

## 2022-09-03 ENCOUNTER — Other Ambulatory Visit: Payer: Medicare HMO | Admitting: Pharmacist

## 2022-09-09 ENCOUNTER — Telehealth: Payer: Self-pay

## 2022-09-09 NOTE — Progress Notes (Signed)
   Care Guide Note  09/09/2022 Name: Rachel Gonzalez MRN: [id number] DOB: 05-20-1950  Referred by: Massie Maroon, FNP Reason for referral : Care Coordination (Outreach to reschedule with Pharmd )   Rachel Gonzalez is a 72 y.o. year old female who is a primary care patient of Massie Maroon, FNP. Lenon Ahmadi was referred to the pharmacist for assistance related to DM.    An unsuccessful telephone outreach was attempted today to contact the patient who was referred to the pharmacy team for assistance with medication management. Additional attempts will be made to contact the patient.   Penne Lash, RMA Care Guide [facility]  [place], [name] [address] Direct Dial: [phone] [email or web address]

## 2022-09-11 NOTE — Progress Notes (Signed)
   Care Guide Note  09/11/2022 Name: Rachel Gonzalez MRN: [id number] DOB: 1950/10/23  Referred by: Massie Maroon, FNP Reason for referral : Care Coordination (Outreach to reschedule with Pharmd )   Rachel Gonzalez is a 72 y.o. year old female who is a primary care patient of Massie Maroon, FNP. Lenon Ahmadi was referred to the pharmacist for assistance related to DM.    A second unsuccessful telephone outreach was attempted today to contact the patient who was referred to the pharmacy team for assistance with medication management. Additional attempts will be made to contact the patient.  Penne Lash, RMA Care Guide [facility]  [place], [name] [address] Direct Dial: [phone] [email or web address]

## 2022-09-15 ENCOUNTER — Telehealth: Payer: Self-pay

## 2022-09-15 NOTE — Telephone Encounter (Signed)
Pt was called to advise f the need to schedule her mammo.  Pt can call us or she can reach out breast center [phone]

## 2022-09-16 ENCOUNTER — Encounter: Payer: Self-pay | Admitting: Podiatry

## 2022-09-16 ENCOUNTER — Ambulatory Visit (INDEPENDENT_AMBULATORY_CARE_PROVIDER_SITE_OTHER): Payer: Medicare HMO | Admitting: Podiatry

## 2022-09-16 DIAGNOSIS — M79674 Pain in right toe(s): Secondary | ICD-10-CM

## 2022-09-16 DIAGNOSIS — E1142 Type 2 diabetes mellitus with diabetic polyneuropathy: Secondary | ICD-10-CM

## 2022-09-16 DIAGNOSIS — M79675 Pain in left toe(s): Secondary | ICD-10-CM

## 2022-09-16 DIAGNOSIS — B351 Tinea unguium: Secondary | ICD-10-CM

## 2022-09-16 NOTE — Progress Notes (Signed)
This patient presents to the office with chief complaint of long thick painful nails.  Patient says the nails are painful walking and wearing shoes.  This patient is unable to self treat.  This patient is unable to trim her nails since she is unable to reach her nails. She has amputation big toe right foot. She presents to the office for preventative foot care services.  General Appearance  Alert, conversant and in no acute stress.  Vascular  Dorsalis pedis and posterior tibial  pulses are palpable  bilaterally.  Capillary return is within normal limits  bilaterally. Temperature is within normal limits  bilaterally.  Neurologic  Senn-Weinstein monofilament wire test within normal limits  bilaterally. Muscle power within normal limits bilaterally.  Nails Thick disfigured discolored nails with subungual debris  from hallux to fifth toes left foot and 2-5 right foot. No evidence of bacterial infection or drainage bilaterally.  Orthopedic  No limitations of motion  feet .  No crepitus or effusions noted.  No bony pathology or digital deformities noted.  Skin  normotropic skin with no porokeratosis noted bilaterally.  No signs of infections or ulcers noted.     Onychomycosis  Nails .  Pain in right toes  Pain in left toes  Debridement of nails both feet followed trimming the nails with dremel tool.  Patient requests diabetic shoes.  Told her to make an appointment as she leaves.  RTC 3 months.   Helane Gunther DPM

## 2022-10-05 NOTE — Progress Notes (Signed)
   Care Guide Note  10/05/2022 Name: Rachel Gonzalez MRN: [id number] DOB: 1951/02/06  Referred by: Massie Maroon, FNP Reason for referral : Care Coordination (Outreach to reschedule with Pharmd )   Rachel Gonzalez is a 72 y.o. year old female who is a primary care patient of Massie Maroon, FNP. Rachel Gonzalez was referred to the pharmacist for assistance related to DM.    A third unsuccessful telephone outreach was attempted today to contact the patient who was referred to the pharmacy team for assistance with medication management. The Population Health team is pleased to engage with this patient at any time in the future upon receipt of referral and should he/she be interested in assistance from the [facility] team.   Rachel Gonzalez, RMA Care Guide [facility]  [place], [name] [address] Direct Dial: [phone] [email or web address]

## 2022-10-06 ENCOUNTER — Ambulatory Visit: Payer: Self-pay | Admitting: Family Medicine

## 2022-10-08 ENCOUNTER — Ambulatory Visit: Payer: Self-pay | Admitting: Family Medicine

## 2022-10-09 ENCOUNTER — Other Ambulatory Visit: Payer: Self-pay

## 2022-10-21 ENCOUNTER — Ambulatory Visit: Payer: Medicare HMO

## 2022-10-21 NOTE — Progress Notes (Signed)
Patient presents to the office today for diabetic shoe and insole measuring.  Patient was measured with brannock device to determine size and width for 1 pair of extra depth shoes and foam casted for 3 pair of insoles.   Documentation of medical necessity will be sent to patient's treating diabetic doctor to verify and sign.   Patient's diabetic provider: MAYER / LACHINA Gonzalez IS TREATING  Shoes and insoles will be ordered at that time and patient will be notified for an appointment for fitting when they arrive.  Shoe size (per patient): 8-9 Brannock measurement: 9 Patient shoe selection-   Shoe choice: [id number] Shoe size ordered: 15M ABN AND FINANCIALS SIGNED

## 2022-10-28 ENCOUNTER — Other Ambulatory Visit: Payer: Self-pay | Admitting: Family Medicine

## 2022-10-28 DIAGNOSIS — K219 Gastro-esophageal reflux disease without esophagitis: Secondary | ICD-10-CM

## 2022-11-10 ENCOUNTER — Ambulatory Visit
Admission: RE | Admit: 2022-11-10 | Discharge: 2022-11-10 | Disposition: A | Discharge status: Home / Self Care | Payer: Medicare HMO | Source: Ambulatory Visit | Attending: Family Medicine | Admitting: Family Medicine

## 2022-11-10 DIAGNOSIS — Z1231 Encounter for screening mammogram for malignant neoplasm of breast: Secondary | ICD-10-CM

## 2022-11-13 ENCOUNTER — Other Ambulatory Visit: Payer: Self-pay | Admitting: Pharmacist

## 2022-11-13 NOTE — Progress Notes (Signed)
Patient appearing on report for [facility] - Hypertension Control report due to last documented ambulatory blood pressure of 153/64 on 07/09/22. Next appointment with PCP is 12/15/22   Outreached patient to discuss hypertension control and medication management and left voicemail.  Adam Phenix, PharmD PGY-1 Pharmacy Resident

## 2022-12-15 ENCOUNTER — Ambulatory Visit: Payer: Medicare HMO | Admitting: Family Medicine

## 2022-12-15 ENCOUNTER — Encounter: Payer: Self-pay | Admitting: Family Medicine

## 2022-12-15 VITALS — BP 143/65 | HR 72 | Temp 96.4°F | Wt 202.0 lb

## 2022-12-15 DIAGNOSIS — E785 Hyperlipidemia, unspecified: Secondary | ICD-10-CM | POA: Diagnosis not present

## 2022-12-15 DIAGNOSIS — R21 Rash and other nonspecific skin eruption: Secondary | ICD-10-CM

## 2022-12-15 DIAGNOSIS — N1831 Chronic kidney disease, stage 3a: Secondary | ICD-10-CM | POA: Diagnosis not present

## 2022-12-15 DIAGNOSIS — E1165 Type 2 diabetes mellitus with hyperglycemia: Secondary | ICD-10-CM

## 2022-12-15 DIAGNOSIS — I10 Essential (primary) hypertension: Secondary | ICD-10-CM | POA: Diagnosis not present

## 2022-12-15 DIAGNOSIS — Z23 Encounter for immunization: Secondary | ICD-10-CM

## 2022-12-15 DIAGNOSIS — L299 Pruritus, unspecified: Secondary | ICD-10-CM

## 2022-12-15 LAB — POCT GLYCOSYLATED HEMOGLOBIN (HGB A1C): Hemoglobin A1C: 13.6 % — AB (ref 4.0–5.6)

## 2022-12-15 MED ORDER — SEMAGLUTIDE (1 MG/DOSE) 4 MG/3ML ~~LOC~~ SOPN
1.0000 mg | PEN_INJECTOR | SUBCUTANEOUS | 5 refills | Status: DC
Start: 2022-12-15 — End: 2023-05-17

## 2022-12-15 MED ORDER — TRIAMCINOLONE ACETONIDE 0.1 % EX CREA
1.0000 | TOPICAL_CREAM | Freq: Two times a day (BID) | CUTANEOUS | 0 refills | Status: DC
Start: 2022-12-15 — End: 2023-04-16

## 2022-12-15 MED ORDER — HYDROXYZINE HCL 10 MG PO TABS
10.0000 mg | ORAL_TABLET | Freq: Three times a day (TID) | ORAL | 0 refills | Status: DC | PRN
Start: 2022-12-15 — End: 2023-05-17

## 2022-12-15 MED ORDER — LANTUS SOLOSTAR 100 UNIT/ML ~~LOC~~ SOPN
25.0000 [IU] | PEN_INJECTOR | Freq: Every day | SUBCUTANEOUS | 1 refills | Status: DC
Start: 2022-12-15 — End: 2023-11-29

## 2022-12-15 NOTE — Progress Notes (Signed)
Established Patient Office Visit  Subjective   Patient ID: Rachel Gonzalez, female    DOB: 05-Jul-1950  Age: 72 y.o. MRN: [id number]  Chief Complaint  Patient presents with   Diabetes    Rachel Gonzalez is a 72 year old female with a medical history significant for type 2 diabetes mellitus, essential hypertension, hyperlipidemia, obesity, chronic kidney disease, and osteoarthritis presents for a 51-month follow-up of chronic conditions.  Patient has no new complaints on today.  Diabetes She presents for her follow-up diabetic visit. She has type 2 diabetes mellitus. Pertinent negatives for diabetes include no blurred vision, no chest pain, no fatigue, no foot paresthesias, no foot ulcerations, no polydipsia, no polyphagia, no polyuria, no visual change, no weakness and no weight loss. Current diabetic treatment includes diet. She is compliant with treatment some of the time. She sees a podiatrist.Eye exam is not current.  Hypertension This is a chronic problem. The problem is uncontrolled. Pertinent negatives include no blurred vision or chest pain. Risk factors for coronary artery disease include obesity and sedentary lifestyle. There is no history of hyperaldosteronism, hypercortisolism, pheochromocytoma, renovascular disease, sleep apnea or a thyroid problem.    Patient Active Problem List   Diagnosis Date Noted   Pain due to onychomycosis of toenails of both feet 06/17/2022   Acute metabolic encephalopathy 04/11/2020   Acute lower UTI 04/11/2020   DKA (diabetic ketoacidosis) (HCC) 04/10/2020   Altered mental status 04/10/2020   CAP (community acquired pneumonia) 04/10/2020   Pyuria 04/10/2020   Stage 3 chronic kidney disease (HCC) 12/01/2016   Frequent falls 04/28/2016   Right arm weakness 04/28/2016   Bilateral lower extremity edema 04/28/2016   Screening for colon cancer 04/28/2016   Abnormal urinalysis 09/13/2014   Chest pain 09/13/2014   Abdominal pain, recurrent 09/13/2014    Dehydration with hyponatremia 09/13/2014   Hyperkalemia 09/13/2014   Acute renal failure superimposed on stage 3 chronic kidney disease (HCC) 09/13/2014   Anemia 05/06/2014   Diabetic neuropathy, type II diabetes mellitus (HCC) 05/06/2014   Obesity (BMI 30-39.9) 05/06/2014   Cellulitis of right foot 05/06/2014   Essential hypertension 03/07/2011   PERS HX NONCOMPLIANCE W/MED TX PRS HAZARDS HLTH 02/07/2009   Right arm pain 08/04/2006   Osteoarthritis 02/14/2006   Uncontrolled type 2 diabetes mellitus with hyperglycemia (HCC) 01/11/2006   Past Medical History:  Diagnosis Date   Anemia    Arthritis    right finger   CHF (congestive heart failure) (HCC)    Chronic renal disease, stage 3, moderately decreased glomerular filtration rate (GFR) between 30-59 mL/min/1.73 square meter (HCC)    Diabetes mellitus    GERD (gastroesophageal reflux disease)    Hypertension    Past Surgical History:  Procedure Laterality Date   AMPUTATION Right 05/08/2014   Procedure: AMPUTATION RAY, right great toe;  Surgeon: Nadara Mustard, MD;  Location: [facility] OR;  Service: Orthopedics;  Laterality: Right;   arm surgery      CESAREAN SECTION     COLONOSCOPY     I & D EXTREMITY Right 05/08/2014   Procedure: IRRIGATION AND DEBRIDEMENT FOOT;  Surgeon: Nadara Mustard, MD;  Location: [facility] OR;  Service: Orthopedics;  Laterality: Right;   MEMBRANE PEEL Right 03/15/2018   Procedure: MEMBRANE PEEL;  Surgeon: Carmela Rima, MD;  Location[facility] OR;  Service: Ophthalmology;  Laterality: Right;   PHOTOCOAGULATION WITH LASER Right 03/15/2018   Procedure: PHOTOCOAGULATION WITH LASER;  Surgeon: Carmela Rima, MD;  Location: [facility] OR;  Service:  Ophthalmology;  Laterality: Right;   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/2018   Procedure: RIGHT EYE COMPLEX RETINA DETACHMENT VITRECTOMY MEMBRANE PEEL WITH AIR,GAS,SILICONE OIL PHOTOCOAGULATION;  Surgeon: Carmela Rima, MD;  Location: [facility] OR;  Service: Ophthalmology;   Laterality: Right;   Social History   Tobacco Use   Smoking status: Never   Smokeless tobacco: Never  Vaping Use   Vaping status: Never Used  Substance Use Topics   Alcohol use: No   Drug use: No   Social History   Socioeconomic History   Marital status: Married    Spouse name: Not on file   Number of children: Not on file   Years of education: Not on file   Highest education level: Not on file  Occupational History   Not on file  Tobacco Use   Smoking status: Never   Smokeless tobacco: Never  Vaping Use   Vaping status: Never Used  Substance and Sexual Activity   Alcohol use: No   Drug use: No   Sexual activity: Not on file  Other Topics Concern   Not on file  Social History Narrative   From [place].   Married.   Three children, 9 grandchildren.   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   Travels to [place], [address]   Social Determinants of Health   Financial Resource Strain: Low Risk  (06/11/2022)   Overall Financial Resource Strain (CARDIA)    Difficulty of Paying Living Expenses: Not hard at all  Food Insecurity: No Food Insecurity (06/11/2022)   Hunger Vital Sign    Worried About Running Out of Food in the Last Year: Never true    Ran Out of Food in the Last Year: Never true  Transportation Needs: No Transportation Needs (06/11/2022)   PRAPARE - Administrator, Civil Service (Medical): No    Lack of Transportation (Non-Medical): No  Physical Activity: Sufficiently Active (06/11/2022)   Exercise Vital Sign    Days of Exercise per Week: 3 days    Minutes of Exercise per Session: 60 min  Stress: No Stress Concern Present (06/11/2022)   [employer] of Occupational Health - Occupational Stress Questionnaire    Feeling of Stress : Not at all  Social Connections: Socially Integrated (06/11/2022)   Social Connection and Isolation Panel [NHANES]    Frequency of Communication with Friends and Family: More  than three times a week    Frequency of Social Gatherings with Friends and Family: More than three times a week    Attends Religious Services: More than 4 times per year    Active Member of [employer] or Organizations: Yes    Attends Engineer, structural: More than 4 times per year    Marital Status: Married  Catering manager Violence: Not At Risk (06/11/2022)   Humiliation, Afraid, Rape, and Kick questionnaire    Fear of Current or Ex-Partner: No    Emotionally Abused: No    Physically Abused: No    Sexually Abused: No   Family Status  Relation Name Status   Mother  Deceased   Father  Deceased   Neg Hx  (Not Specified)  No partnership data on file   Family History  Problem Relation Age of Onset   Diabetes Mother    Breast cancer Neg Hx    Allergies  Allergen Reactions   Tramadol Nausea And Vomiting   Adhesive [Tape] Itching   Bactrim [Sulfamethoxazole-Trimethoprim]  Other (See Comments)    AKI and hyperkalemia    Tylenol With Codeine #3 [Acetaminophen-Codeine] Diarrhea and Itching    Only allergic to Codeine, patient reports no allergy to Tylenol.      Review of Systems  Constitutional:  Negative for fatigue and weight loss.  Eyes:  Negative for blurred vision.  Cardiovascular:  Negative for chest pain.  Neurological:  Negative for weakness.  Endo/Heme/Allergies:  Negative for polydipsia and polyphagia.      Objective:     Pulse 72   Temp (!) 96.4 F (35.8 C)   Wt 202 lb (91.6 kg)   SpO2 100%   BMI 34.67 kg/m  BP Readings from Last 3 Encounters:  07/09/22 (!) 153/64  06/12/22 (!) 158/83  05/14/22 (!) 154/73   Wt Readings from Last 3 Encounters:  12/15/22 202 lb (91.6 kg)  07/09/22 213 lb 9.6 oz (96.9 kg)  06/11/22 204 lb (92.5 kg)      Physical Exam   No results found for any visits on 12/15/22.  Last CBC Lab Results  Component Value Date   WBC 5.2 04/07/2022   HGB 10.6 (L) 04/07/2022   HCT 33.3 (L) 04/07/2022   MCV 84.5 04/07/2022    MCH 26.9 04/07/2022   RDW 13.0 04/07/2022   PLT 201 04/07/2022   Last metabolic panel Lab Results  Component Value Date   GLUCOSE 189 (H) 07/09/2022   NA 143 07/09/2022   K 4.7 07/09/2022   CL 107 (H) 07/09/2022   CO2 21 07/09/2022   BUN 32 (H) 07/09/2022   CREATININE 1.60 (H) 07/09/2022   EGFR 34 (L) 07/09/2022   CALCIUM 9.1 07/09/2022   PHOS 3.4 04/10/2020   PROT 6.8 07/09/2022   ALBUMIN 4.1 07/09/2022   LABGLOB 2.7 07/09/2022   AGRATIO 1.5 07/09/2022   BILITOT <0.2 07/09/2022   ALKPHOS 176 (H) 07/09/2022   AST 15 07/09/2022   ALT 10 07/09/2022   ANIONGAP 8 04/07/2022   Last lipids Lab Results  Component Value Date   CHOL 156 06/26/2019   HDL 78 06/26/2019   LDLCALC 68 06/26/2019   LDLDIRECT 74 08/10/2007   TRIG 43 06/26/2019   CHOLHDL 2.0 06/26/2019   Last hemoglobin A1c Lab Results  Component Value Date   HGBA1C 10.7 (A) 07/09/2022   Last thyroid functions Lab Results  Component Value Date   TSH 0.838 04/10/2020   Last vitamin D No results found for: "25OHVITD2", "25OHVITD3", "VD25OH" Last vitamin B12 and Folate Lab Results  Component Value Date   VITAMINB12 384 05/06/2014   FOLATE 17.4 05/06/2014      The ASCVD Risk score (Arnett DK, et al., 2019) failed to calculate for the following reasons:   Cannot find a previous HDL lab   Cannot find a previous total cholesterol lab    Assessment & Plan:   Problem List Items Addressed This Visit       Cardiovascular and Mediastinum   Essential hypertension     Endocrine   Uncontrolled type 2 diabetes mellitus with hyperglycemia (HCC) - Primary   Relevant Orders   POCT glycosylated hemoglobin (Hb A1C)   CBC with Differential   CMP and Liver     Genitourinary   Stage 3 chronic kidney disease (HCC)   Relevant Orders   CMP and Liver   Other Visit Diagnoses     Hyperlipidemia, unspecified hyperlipidemia type         1. Uncontrolled type 2 diabetes mellitus with hyperglycemia (HCC) Patient's  hemoglobin A1c markedly elevated at 13.6.  Discussed medications at length.  Will increase Lantus to 25 mg daily. Continue semaglutide 1 mg weekly - POCT glycosylated hemoglobin (Hb A1C) - CBC with Differential - CMP and Liver - insulin glargine (LANTUS SOLOSTAR) 100 UNIT/ML Solostar Pen; Inject 25 Units into the skin daily.  Dispense: 45 mL; Refill: 1 - Semaglutide, 1 MG/DOSE, 4 MG/3ML SOPN; Inject 1 mg as directed once a week.  Dispense: 3 mL; Refill: 5  2. Stage 3a chronic kidney disease (HCC) Continue to follow-up with nephrology as scheduled. - CMP and Liver  3. Hyperlipidemia, unspecified hyperlipidemia type No medication changes today, the patient is asked to make an attempt to improve diet and exercise patterns to aid in medical management of this problem.   4. Essential hypertension BP (!) 143/65   Pulse 72   Temp (!) 96.4 F (35.8 C)   Wt 202 lb (91.6 kg)   SpO2 100%   BMI 34.67 kg/m  - Continue medication, monitor blood pressure at home. Continue DASH diet.  Reminder to go to the ER if any CP, SOB, nausea, dizziness, severe HA, changes vision/speech, left arm numbness and tingling and jaw pain.    5. Pruritic condition - hydrOXYzine (ATARAX) 10 MG tablet; Take 1 tablet (10 mg total) by mouth 3 (three) times daily as needed.  Dispense: 30 tablet; Refill: 0 - Ambulatory referral to Dermatology - triamcinolone cream (KENALOG) 0.1 %; Apply 1 Application topically 2 (two) times daily.  Dispense: 135 each; Refill: 0  6. Generalized maculopapular rash  - Ambulatory referral to Dermatology - triamcinolone cream (KENALOG) 0.1 %; Apply 1 Application topically 2 (two) times daily.  Dispense: 135 each; Refill: 0  7. Encounter for immunization  - Flu Vaccine Trivalent High Dose (Fluad) - Flu Vaccine QUAD High Dose(Fluad)  8. Need for influenza vaccination  - Flu Vaccine QUAD High Dose(Fluad)   Return in about 1 month (around 01/15/2023) for diabetes.   Nolon Nations  APRN, MSN, FNP-C Patient Care [facility] [address] [place], [name] [address] [phone]

## 2022-12-15 NOTE — Patient Instructions (Addendum)
Today your hemoglobin A1c is 13.6.  Will make a few medication changes.  Your Lantus will increase to 25 units at bedtime.  Also, will increase Ozempic (semaglutide) to 1 mg weekly.  Recommend a carbohydrate modified diet divided over small meals throughout the day. Today, for generalized rash, we have sent a referral to dermatology.  A trial of triamcinolone cream has been sent to your pharmacy.  Also, Atarax 10 mg 3 times per day as needed for generalized itching.

## 2022-12-16 LAB — CBC WITH DIFFERENTIAL/PLATELET
Basophils Absolute: 0.1 10*3/uL (ref 0.0–0.2)
Basos: 1 %
EOS (ABSOLUTE): 0.1 10*3/uL (ref 0.0–0.4)
Eos: 2 %
Hematocrit: 34.3 % (ref 34.0–46.6)
Hemoglobin: 10.4 g/dL — ABNORMAL LOW (ref 11.1–15.9)
Immature Grans (Abs): 0 10*3/uL (ref 0.0–0.1)
Immature Granulocytes: 0 %
Lymphocytes Absolute: 1.7 10*3/uL (ref 0.7–3.1)
Lymphs: 32 %
MCH: 27 pg (ref 26.6–33.0)
MCHC: 30.3 g/dL — ABNORMAL LOW (ref 31.5–35.7)
MCV: 89 fL (ref 79–97)
Monocytes Absolute: 0.3 10*3/uL (ref 0.1–0.9)
Monocytes: 6 %
Neutrophils Absolute: 3 10*3/uL (ref 1.4–7.0)
Neutrophils: 59 %
Platelets: 225 10*3/uL (ref 150–450)
RBC: 3.85 x10E6/uL (ref 3.77–5.28)
RDW: 12.1 % (ref 11.7–15.4)
WBC: 5.2 10*3/uL (ref 3.4–10.8)

## 2022-12-16 LAB — CMP AND LIVER
ALT: 13 [IU]/L (ref 0–32)
AST: 17 [IU]/L (ref 0–40)
Albumin: 3.9 g/dL (ref 3.8–4.8)
Alkaline Phosphatase: 164 [IU]/L — ABNORMAL HIGH (ref 44–121)
BUN: 32 mg/dL — ABNORMAL HIGH (ref 8–27)
Bilirubin Total: 0.2 mg/dL (ref 0.0–1.2)
Bilirubin, Direct: 0.11 mg/dL (ref 0.00–0.40)
CO2: 20 mmol/L (ref 20–29)
Calcium: 9.4 mg/dL (ref 8.7–10.3)
Chloride: 106 mmol/L (ref 96–106)
Creatinine, Ser: 1.69 mg/dL — ABNORMAL HIGH (ref 0.57–1.00)
Glucose: 308 mg/dL — ABNORMAL HIGH (ref 70–99)
Potassium: 5.6 mmol/L — ABNORMAL HIGH (ref 3.5–5.2)
Sodium: 140 mmol/L (ref 134–144)
Total Protein: 6.5 g/dL (ref 6.0–8.5)
eGFR: 32 mL/min/{1.73_m2} — ABNORMAL LOW (ref >59)

## 2022-12-17 ENCOUNTER — Ambulatory Visit: Payer: Medicare HMO | Admitting: Podiatry

## 2023-01-06 ENCOUNTER — Telehealth: Payer: Self-pay | Admitting: Podiatry

## 2023-01-06 NOTE — Telephone Encounter (Signed)
Pts husband called checking on status of diabetic shoes. Upon checking safestep we have not received the documents needed.  Pts husband is going to stop by our office and pick them up and take to the pcp office

## 2023-01-08 ENCOUNTER — Other Ambulatory Visit: Payer: Self-pay

## 2023-01-12 ENCOUNTER — Ambulatory Visit: Payer: Medicare HMO | Admitting: Family Medicine

## 2023-01-12 ENCOUNTER — Encounter: Payer: Self-pay | Admitting: Family Medicine

## 2023-01-12 VITALS — BP 151/64 | HR 81 | Temp 97.0°F | Wt 205.0 lb

## 2023-01-12 DIAGNOSIS — E785 Hyperlipidemia, unspecified: Secondary | ICD-10-CM | POA: Diagnosis not present

## 2023-01-12 DIAGNOSIS — E1165 Type 2 diabetes mellitus with hyperglycemia: Secondary | ICD-10-CM

## 2023-01-12 DIAGNOSIS — I10 Essential (primary) hypertension: Secondary | ICD-10-CM | POA: Diagnosis not present

## 2023-01-12 DIAGNOSIS — E1140 Type 2 diabetes mellitus with diabetic neuropathy, unspecified: Secondary | ICD-10-CM | POA: Diagnosis not present

## 2023-01-12 DIAGNOSIS — Z23 Encounter for immunization: Secondary | ICD-10-CM

## 2023-01-12 DIAGNOSIS — N1831 Chronic kidney disease, stage 3a: Secondary | ICD-10-CM | POA: Diagnosis not present

## 2023-01-12 LAB — POCT GLYCOSYLATED HEMOGLOBIN (HGB A1C): Hemoglobin A1C: 10.4 % — AB (ref 4.0–5.6)

## 2023-01-12 NOTE — Progress Notes (Signed)
Established Patient Office Visit  Subjective   Patient ID: Rachel Gonzalez, female    DOB: December 14, 1950  Age: 72 y.o. MRN: [id number]  Chief Complaint  Patient presents with   Diabetes    Rachel Gonzalez is a 72 year old female with a medical history significant for type 2 diabetes mellitus, chronic kidney disease, hyperlipidemia, and obesity that presents for follow-up of her chronic conditions. Patient was evaluated in clinic 1 month prior and her A1c was found to be 13.6.  At that time, patient was not taking her home medications consistently and under a great deal of stress at home. She states that over the past month, she is made a conscious effort to watch her diet and take her prescribed medications consistently.  Patient was also referred to pharmacy services for assistance with medication management. Patient does not routinely check blood glucose at home.  She says that she has been inconsistent and did not bring her glucometer today. Patient also has a history of hypertension.  She does not check her blood pressure at home.  She denies any lower extremity swelling, chest pain, or heart palpitations today.  Diabetes There are no hypoglycemic associated symptoms. Pertinent negatives for diabetes include no blurred vision, no foot paresthesias, no polydipsia, no polyphagia, no polyuria, no visual change, no weakness and no weight loss. She sees a podiatrist.Eye exam is current.    Patient Active Problem List   Diagnosis Date Noted   Plantar flexed metatarsal bone of left foot 01/18/2023   Capsulitis 01/18/2023   Pain due to onychomycosis of toenails of both feet 06/17/2022   Acute metabolic encephalopathy 04/11/2020   Acute lower UTI 04/11/2020   DKA (diabetic ketoacidosis) (HCC) 04/10/2020   Altered mental status 04/10/2020   CAP (community acquired pneumonia) 04/10/2020   Pyuria 04/10/2020   Stage 3 chronic kidney disease (HCC) 12/01/2016   Frequent falls 04/28/2016   Right  arm weakness 04/28/2016   Bilateral lower extremity edema 04/28/2016   Screening for colon cancer 04/28/2016   Abnormal urinalysis 09/13/2014   Chest pain 09/13/2014   Abdominal pain, recurrent 09/13/2014   Dehydration with hyponatremia 09/13/2014   Hyperkalemia 09/13/2014   Acute renal failure superimposed on stage 3 chronic kidney disease (HCC) 09/13/2014   Anemia 05/06/2014   Diabetic neuropathy, type II diabetes mellitus (HCC) 05/06/2014   Obesity (BMI 30-39.9) 05/06/2014   Cellulitis of right foot 05/06/2014   Essential hypertension 03/07/2011   PERS HX NONCOMPLIANCE W/MED TX PRS HAZARDS HLTH 02/07/2009   Right arm pain 08/04/2006   Osteoarthritis 02/14/2006   Uncontrolled type 2 diabetes mellitus with hyperglycemia (HCC) 01/11/2006   Past Medical History:  Diagnosis Date   Anemia    Arthritis    right finger   CHF (congestive heart failure) (HCC)    Chronic renal disease, stage 3, moderately decreased glomerular filtration rate (GFR) between 30-59 mL/min/1.73 square meter (HCC)    Diabetes mellitus    GERD (gastroesophageal reflux disease)    Hypertension       Review of Systems  Constitutional:  Negative for chills, fever and weight loss.  HENT: Negative.    Eyes:  Negative for blurred vision and double vision.  Cardiovascular: Negative.   Musculoskeletal:  Positive for back pain and joint pain.  Skin: Negative.   Neurological:  Negative for weakness.  Endo/Heme/Allergies:  Negative for polydipsia and polyphagia.      Objective:     BP (!) 151/64   Pulse 81   Temp (!)  97 F (36.1 C)   Wt 205 lb (93 kg)   SpO2 100%   BMI 35.19 kg/m  BP Readings from Last 3 Encounters:  01/12/23 (!) 151/64  12/15/22 (!) 143/65  07/09/22 (!) 153/64   Wt Readings from Last 3 Encounters:  01/12/23 205 lb (93 kg)  12/15/22 202 lb (91.6 kg)  07/09/22 213 lb 9.6 oz (96.9 kg)      Physical Exam Constitutional:      Appearance: Normal appearance.  Eyes:     Pupils:  Pupils are equal, round, and reactive to light.  Cardiovascular:     Rate and Rhythm: Normal rate and regular rhythm.  Pulmonary:     Effort: Pulmonary effort is normal.  Abdominal:     General: Bowel sounds are normal.  Skin:    General: Skin is warm.  Neurological:     General: No focal deficit present.     Mental Status: She is alert. Mental status is at baseline.  Psychiatric:        Mood and Affect: Mood normal.        Thought Content: Thought content normal.        Judgment: Judgment normal.      Results for orders placed or performed in visit on 01/12/23  Microscopic Examination  Result Value Ref Range   WBC, UA 11-30 (A) 0 - 5 /hpf   RBC, Urine None seen 0 - 2 /hpf   Epithelial Cells (non renal) 0-10 0 - 10 /hpf   Casts None seen None seen /lpf   Bacteria, UA Few None seen/Few  Microalbumin/Creatinine Ratio, Urine  Result Value Ref Range   Creatinine, Urine 90.7 Not Estab. mg/dL   Microalbumin, Urine 409.8 Not Estab. ug/mL   Microalb/Creat Ratio 141 (H) 0 - 29 mg/g creat  Urinalysis, Routine w reflex microscopic  Result Value Ref Range   Specific Gravity, UA 1.014 1.005 - 1.030   pH, UA 6.0 5.0 - 7.5   Color, UA Yellow Yellow   Appearance Ur Clear Clear   Leukocytes,UA 2+ (A) Negative   Protein,UA 1+ (A) Negative/Trace   Glucose, UA Negative Negative   Ketones, UA Negative Negative   RBC, UA Negative Negative   Bilirubin, UA Negative Negative   Urobilinogen, Ur 0.2 0.2 - 1.0 mg/dL   Nitrite, UA Negative Negative   Microscopic Examination See below:   POCT glycosylated hemoglobin (Hb A1C)  Result Value Ref Range   Hemoglobin A1C 10.4 (A) 4.0 - 5.6 %   HbA1c POC (<> result, manual entry)     HbA1c, POC (prediabetic range)     HbA1c, POC (controlled diabetic range)      Last CBC Lab Results  Component Value Date   WBC 5.2 12/15/2022   HGB 10.4 (L) 12/15/2022   HCT 34.3 12/15/2022   MCV 89 12/15/2022   MCH 27.0 12/15/2022   RDW 12.1 12/15/2022    PLT 225 12/15/2022      The ASCVD Risk score (Arnett DK, et al., 2019) failed to calculate for the following reasons:   Cannot find a previous HDL lab   Cannot find a previous total cholesterol lab    Assessment & Plan:   Problem List Items Addressed This Visit       Cardiovascular and Mediastinum   Essential hypertension   Relevant Orders   Urinalysis, Routine w reflex microscopic (Completed)     Endocrine   Uncontrolled type 2 diabetes mellitus with hyperglycemia (HCC) - Primary  Relevant Orders   Microalbumin/Creatinine Ratio, Urine (Completed)   Ambulatory referral to Nephrology   Urinalysis, Routine w reflex microscopic (Completed)   Ambulatory referral to Ophthalmology   POCT glycosylated hemoglobin (Hb A1C) (Completed)     Genitourinary   Stage 3 chronic kidney disease (HCC)   Other Visit Diagnoses     Hyperlipidemia, unspecified hyperlipidemia type       Encounter for immunization       Diabetic neuropathy, painful (HCC)         1. Uncontrolled type 2 diabetes mellitus with hyperglycemia (HCC) Patient is hemoglobin A1c has improved to 10.4 from 13.67-month prior.  Patient advised to continue a carbohydrate modified diet.  - Microalbumin/Creatinine Ratio, Urine - Ambulatory referral to Nephrology - Urinalysis, Routine w reflex microscopic - Ambulatory referral to Ophthalmology - POCT glycosylated hemoglobin (Hb A1C)  2. Essential hypertension BP (!) 151/64   Pulse 81   Temp (!) 97 F (36.1 C)   Wt 205 lb (93 kg)   SpO2 100%   BMI 35.19 kg/m   - Urinalysis, Routine w reflex microscopic  3. Stage 3a chronic kidney disease (HCC) - Ambulatory referral to Nephrology  4. Hyperlipidemia, unspecified hyperlipidemia type The patient is asked to make an attempt to improve diet and exercise patterns to aid in medical management of this problem.  5. Diabetic neuropathy, painful (HCC)  Follow-up in 3 months for chronic conditions  Aryn Kops Rennis Petty   APRN, MSN, FNP-C Patient Care [facility] [address] [place], [name] [address] [phone]

## 2023-01-13 LAB — MICROSCOPIC EXAMINATION
Casts: NONE SEEN /[LPF]
RBC, Urine: NONE SEEN /[HPF] (ref 0–2)

## 2023-01-13 LAB — URINALYSIS, ROUTINE W REFLEX MICROSCOPIC
Bilirubin, UA: NEGATIVE
Glucose, UA: NEGATIVE
Ketones, UA: NEGATIVE
Nitrite, UA: NEGATIVE
RBC, UA: NEGATIVE
Specific Gravity, UA: 1.014 (ref 1.005–1.030)
Urobilinogen, Ur: 0.2 mg/dL (ref 0.2–1.0)
pH, UA: 6 (ref 5.0–7.5)

## 2023-01-13 LAB — MICROALBUMIN / CREATININE URINE RATIO
Creatinine, Urine: 90.7 mg/dL
Microalb/Creat Ratio: 141 mg/g{creat} — ABNORMAL HIGH (ref 0–29)
Microalbumin, Urine: 128.1 ug/mL

## 2023-01-18 ENCOUNTER — Encounter: Payer: Self-pay | Admitting: Podiatry

## 2023-01-18 ENCOUNTER — Ambulatory Visit (INDEPENDENT_AMBULATORY_CARE_PROVIDER_SITE_OTHER): Payer: Medicare HMO | Admitting: Podiatry

## 2023-01-18 DIAGNOSIS — B351 Tinea unguium: Secondary | ICD-10-CM | POA: Diagnosis not present

## 2023-01-18 DIAGNOSIS — M779 Enthesopathy, unspecified: Secondary | ICD-10-CM | POA: Insufficient documentation

## 2023-01-18 DIAGNOSIS — M216X2 Other acquired deformities of left foot: Secondary | ICD-10-CM | POA: Diagnosis not present

## 2023-01-18 DIAGNOSIS — E1142 Type 2 diabetes mellitus with diabetic polyneuropathy: Secondary | ICD-10-CM | POA: Diagnosis not present

## 2023-01-18 DIAGNOSIS — M79675 Pain in left toe(s): Secondary | ICD-10-CM | POA: Diagnosis not present

## 2023-01-18 DIAGNOSIS — M79674 Pain in right toe(s): Secondary | ICD-10-CM

## 2023-01-18 NOTE — Progress Notes (Signed)
This patient presents to the office with chief complaint of long thick painful nails.  Patient says the nails are painful walking and wearing shoes.  This patient is unable to self treat.  This patient is unable to trim her nails since she is unable to reach her nails. She has amputation big toe right foot.  She now experiences occasional pain under the outside ball of left foot.  She presents to the office for preventative foot care services.  General Appearance  Alert, conversant and in no acute stress.  Vascular  Dorsalis pedis and posterior tibial  pulses are palpable  bilaterally.  Capillary return is within normal limits  bilaterally. Temperature is within normal limits  bilaterally.  Neurologic  Senn-Weinstein monofilament wire test within normal limits  bilaterally. Muscle power within normal limits bilaterally.  Nails Thick disfigured discolored nails with subungual debris  from hallux to fifth toes left foot and 2-5 right foot. No evidence of bacterial infection or drainage bilaterally.  Orthopedic  No limitations of motion  feet .  No crepitus or effusions noted.  No bony pathology or digital deformities noted. 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  Skin  normotropic skin with no porokeratosis noted bilaterally.  No signs of infections or ulcers noted.     Onychomycosis  Nails .  Pain in right toes  Pain in left toes  Capsulitis 5th MPJ left foot.  Debridement of nails both feet followed trimming the nails with dremel tool.  Discussed her capsulitis left foot with patient.  She says she is awaiting diabetic shoes. RTC 3 months.   Helane Gunther DPM

## 2023-01-22 ENCOUNTER — Other Ambulatory Visit: Payer: Self-pay | Admitting: Family Medicine

## 2023-01-28 ENCOUNTER — Other Ambulatory Visit: Payer: Self-pay | Admitting: Family Medicine

## 2023-01-28 DIAGNOSIS — K219 Gastro-esophageal reflux disease without esophagitis: Secondary | ICD-10-CM

## 2023-01-29 ENCOUNTER — Telehealth: Payer: Self-pay

## 2023-01-29 NOTE — Patient Outreach (Signed)
Attempted to contact patient regarding care gaps. Left voicemail for patient to return my call at [phone].  Nicholes Rough, CMA Care Guide [employer]

## 2023-02-25 ENCOUNTER — Other Ambulatory Visit: Payer: Self-pay | Admitting: Nurse Practitioner

## 2023-02-25 DIAGNOSIS — E1140 Type 2 diabetes mellitus with diabetic neuropathy, unspecified: Secondary | ICD-10-CM

## 2023-02-25 NOTE — Telephone Encounter (Signed)
Please advise has appt with Fola in feb. Thank you. KH

## 2023-03-03 ENCOUNTER — Other Ambulatory Visit: Payer: Self-pay | Admitting: Family Medicine

## 2023-03-03 DIAGNOSIS — R600 Localized edema: Secondary | ICD-10-CM

## 2023-03-03 DIAGNOSIS — I10 Essential (primary) hypertension: Secondary | ICD-10-CM

## 2023-03-08 ENCOUNTER — Other Ambulatory Visit: Payer: Self-pay | Admitting: Family Medicine

## 2023-03-08 ENCOUNTER — Other Ambulatory Visit: Payer: Self-pay

## 2023-03-08 DIAGNOSIS — L299 Pruritus, unspecified: Secondary | ICD-10-CM

## 2023-03-08 DIAGNOSIS — R21 Rash and other nonspecific skin eruption: Secondary | ICD-10-CM

## 2023-03-09 ENCOUNTER — Telehealth: Payer: Self-pay | Admitting: Family Medicine

## 2023-03-09 NOTE — Telephone Encounter (Signed)
Has not had eye exam in the past year but would like a referral to one

## 2023-03-19 ENCOUNTER — Encounter: Payer: Self-pay | Admitting: Pharmacist

## 2023-04-16 ENCOUNTER — Ambulatory Visit (INDEPENDENT_AMBULATORY_CARE_PROVIDER_SITE_OTHER): Payer: Medicare HMO | Admitting: Nurse Practitioner

## 2023-04-16 ENCOUNTER — Encounter: Payer: Self-pay | Admitting: Nurse Practitioner

## 2023-04-16 VITALS — BP 163/59 | HR 76 | Temp 97.3°F | Wt 210.0 lb

## 2023-04-16 DIAGNOSIS — E785 Hyperlipidemia, unspecified: Secondary | ICD-10-CM | POA: Insufficient documentation

## 2023-04-16 DIAGNOSIS — I10 Essential (primary) hypertension: Secondary | ICD-10-CM | POA: Diagnosis not present

## 2023-04-16 DIAGNOSIS — N1831 Chronic kidney disease, stage 3a: Secondary | ICD-10-CM | POA: Diagnosis not present

## 2023-04-16 DIAGNOSIS — E1165 Type 2 diabetes mellitus with hyperglycemia: Secondary | ICD-10-CM | POA: Diagnosis not present

## 2023-04-16 LAB — POCT GLYCOSYLATED HEMOGLOBIN (HGB A1C): Hemoglobin A1C: 8.2 % — AB (ref 4.0–5.6)

## 2023-04-16 MED ORDER — FREESTYLE LIBRE 3 SENSOR MISC
3 refills | Status: DC
Start: 2023-04-16 — End: 2023-05-17

## 2023-04-16 MED ORDER — VALSARTAN 80 MG PO TABS: 80.0000 mg | ORAL_TABLET | Freq: Every day | ORAL | 1 refills | Status: DC

## 2023-04-16 MED ORDER — FREESTYLE LIBRE 3 READER DEVI: 1.0000 | Freq: Every day | 0 refills | Status: DC

## 2023-04-16 MED ORDER — AMLODIPINE BESYLATE 10 MG PO TABS: 10.0000 mg | ORAL_TABLET | Freq: Every day | ORAL | 3 refills | Status: DC

## 2023-04-16 NOTE — Progress Notes (Signed)
 New Patient Office Visit  Subjective:  Patient ID: Rachel Gonzalez, female    DOB: 04/11/1950  Age: 73 y.o. MRN: [id number]  CC:  Chief Complaint  Patient presents with   Diabetes    HPI IVYONNA Gonzalez is a 73 y.o. female  has a past medical history of Anemia, Arthritis, CHF (congestive heart failure) (HCC), Chronic renal disease, stage 3, moderately decreased glomerular filtration rate (GFR) between 30-59 mL/min/1.73 square meter (HCC), Diabetes mellitus, GERD (gastroesophageal reflux disease), and Hypertension.  Patient presents for follow-up for her chronic medical conditions and to establish care with new provider.  Previous PCP [place] NP  Hypertension.  Currently on amlodipine  10 mg daily, valsartan  80 mg daily furosemide  20 mg daily.  Patient is not sure if she has been taking valsartan  or not.  She denies chest pain, shortness of breath, edema  Type 2 diabetes.  Currently on Ozempic  0.5 mg once weekly injection, Lantus  25 units daily, stated that she was not taking  ozempic   for a while due to cost of medication however she recently received 3 new pens.  Stated that she has been on Ozempic  0.5 mg once weekly for 2 weeks.  Patient denies polyuria polydipsia polyphagia.  Has a glucometer but has not been checking her blood sugar due to moving to a new location.  I discussed ordering a new glucometer today   CKD.  Patient was referred to nephrologist at her last visit , stated that she saw a nephrologist in the past but not sure of when it was.  Patient encouraged to bring all her medications with her to her next appointment  Past Medical History:  Diagnosis Date   Anemia    Arthritis    right finger   CHF (congestive heart failure) (HCC)    Chronic renal disease, stage 3, moderately decreased glomerular filtration rate (GFR) between 30-59 mL/min/1.73 square meter (HCC)    Diabetes mellitus    GERD (gastroesophageal reflux disease)    Hypertension     Past Surgical History:   Procedure Laterality Date   AMPUTATION Right 05/08/2014   Procedure: AMPUTATION RAY, right great toe;  Surgeon: Jerona Harden GAILS, MD;  Location: [facility] OR;  Service: Orthopedics;  Laterality: Right;   arm surgery      CESAREAN SECTION     COLONOSCOPY     I & D EXTREMITY Right 05/08/2014   Procedure: IRRIGATION AND DEBRIDEMENT FOOT;  Surgeon: Jerona Harden GAILS, MD;  Location: [facility] OR;  Service: Orthopedics;  Laterality: Right;   MEMBRANE PEEL Right 03/15/2018   Procedure: MEMBRANE PEEL;  Surgeon: Tobie Baptist, MD;  Location: [facility] OR;  Service: Ophthalmology;  Laterality: Right;   PHOTOCOAGULATION WITH LASER Right 03/15/2018   Procedure: PHOTOCOAGULATION WITH LASER;  Surgeon: Tobie Baptist, MD;  Location: [facility] OR;  Service: Ophthalmology;  Laterality: Right;   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/2018   Procedure: RIGHT EYE COMPLEX RETINA DETACHMENT VITRECTOMY MEMBRANE PEEL WITH AIR,GAS,SILICONE OIL PHOTOCOAGULATION;  Surgeon: Tobie Baptist, MD;  Location: [facility] OR;  Service: Ophthalmology;  Laterality: Right;    Family History  Problem Relation Age of Onset   Diabetes Mother    Breast cancer Neg Hx     Social History   Socioeconomic History   Marital status: Married    Spouse name: Not on file   Number of children: Not on file   Years of education: Not on file   Highest education level: Not on file  Occupational History  Not on file  Tobacco Use   Smoking status: Never   Smokeless tobacco: Never  Vaping Use   Vaping status: Never Used  Substance and Sexual Activity   Alcohol use: No   Drug use: No   Sexual activity: Not on file  Other Topics Concern   Not on file  Social History Narrative   From [place] .   Married.   Three children, 9 grandchildren.   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   Travels to [place], [address]   Social Drivers of Health   Financial Resource Strain: Low Risk  (06/11/2022)   Overall Financial  Resource Strain (CARDIA)    Difficulty of Paying Living Expenses: Not hard at all  Food Insecurity: No Food Insecurity (06/11/2022)   Hunger Vital Sign    Worried About Running Out of Food in the Last Year: Never true    Ran Out of Food in the Last Year: Never true  Transportation Needs: No Transportation Needs (06/11/2022)   PRAPARE - Administrator, Civil Service (Medical): No    Lack of Transportation (Non-Medical): No  Physical Activity: Sufficiently Active (06/11/2022)   Exercise Vital Sign    Days of Exercise per Week: 3 days    Minutes of Exercise per Session: 60 min  Stress: No Stress Concern Present (06/11/2022)   [employer] of Occupational Health - Occupational Stress Questionnaire    Feeling of Stress : Not at all  Social Connections: Socially Integrated (06/11/2022)   Social Connection and Isolation Panel [NHANES]    Frequency of Communication with Friends and Family: More than three times a week    Frequency of Social Gatherings with Friends and Family: More than three times a week    Attends Religious Services: More than 4 times per year    Active Member of [employer] or Organizations: Yes    Attends Engineer, Structural: More than 4 times per year    Marital Status: Married  Catering Manager Violence: Not At Risk (06/11/2022)   Humiliation, Afraid, Rape, and Kick questionnaire    Fear of Current or Ex-Partner: No    Emotionally Abused: No    Physically Abused: No    Sexually Abused: No    ROS Review of Systems  Constitutional:  Negative for appetite change, chills, fatigue and fever.  HENT:  Negative for congestion, postnasal drip, rhinorrhea and sneezing.   Respiratory:  Negative for cough, shortness of breath and wheezing.   Cardiovascular:  Negative for chest pain, palpitations and leg swelling.  Gastrointestinal:  Negative for abdominal pain, constipation, nausea and vomiting.  Genitourinary:  Negative for difficulty urinating, dysuria, flank pain  and frequency.  Musculoskeletal:  Negative for arthralgias, back pain, joint swelling and myalgias.  Skin:  Negative for color change, pallor, rash and wound.  Neurological:  Negative for dizziness, facial asymmetry, weakness, numbness and headaches.  Psychiatric/Behavioral:  Negative for behavioral problems, confusion, self-injury and suicidal ideas.     Objective:   Today's Vitals: BP (!) 163/59   Pulse 76   Temp (!) 97.3 F (36.3 C)   Wt 210 lb (95.3 kg)   SpO2 100%   BMI 36.05 kg/m   Physical Exam Vitals and nursing note reviewed.  Constitutional:      General: She is not in acute distress.    Appearance: Normal appearance. She is obese. She is not ill-appearing, toxic-appearing or diaphoretic.  HENT:     Mouth/Throat:  Mouth: Mucous membranes are moist.     Pharynx: Oropharynx is clear. No oropharyngeal exudate or posterior oropharyngeal erythema.  Eyes:     General: No scleral icterus.       Right eye: No discharge.        Left eye: No discharge.     Extraocular Movements: Extraocular movements intact.     Conjunctiva/sclera: Conjunctivae normal.  Cardiovascular:     Rate and Rhythm: Normal rate and regular rhythm.     Pulses: Normal pulses.     Heart sounds: Normal heart sounds. No murmur heard.    No friction rub. No gallop.  Pulmonary:     Effort: Pulmonary effort is normal. No respiratory distress.     Breath sounds: Normal breath sounds. No stridor. No wheezing, rhonchi or rales.  Chest:     Chest wall: No tenderness.  Abdominal:     General: There is no distension.     Palpations: Abdomen is soft.     Tenderness: There is no abdominal tenderness. There is no right CVA tenderness, left CVA tenderness or guarding.  Musculoskeletal:        General: No swelling, tenderness, deformity or signs of injury.     Right lower leg: No edema.     Left lower leg: No edema.  Skin:    General: Skin is warm and dry.     Capillary Refill: Capillary refill takes less  than 2 seconds.     Coloration: Skin is not jaundiced or pale.     Findings: No bruising, erythema or lesion.  Neurological:     Mental Status: She is alert and oriented to person, place, and time.     Motor: No weakness.     Coordination: Coordination normal.     Gait: Gait normal.  Psychiatric:        Mood and Affect: Mood normal.        Behavior: Behavior normal.        Thought Content: Thought content normal.        Judgment: Judgment normal.     Assessment & Plan:   Problem List Items Addressed This Visit       Cardiovascular and Mediastinum   Essential hypertension - Primary   Continue amlodipine  10 mg daily, furosemide  20 mg daily , valsartan  80 mg daily .valsartan  80 mg daily refilled Patient encouraged to monitor blood pressure at home keep a log and bring to next visit in 4 weeks DASH diet and commitment to daily physical activity for a minimum of 30 minutes discussed and encouraged, as a part of hypertension management. The importance of attaining a healthy weight is also discussed.     04/16/2023   11:30 AM 04/16/2023   11:19 AM 01/12/2023   10:43 AM 01/12/2023   10:17 AM 12/15/2022   10:58 AM 07/09/2022   10:59 AM 07/09/2022   10:41 AM  BP/Weight  Systolic BP 163 168 151 153 143 153 162  Diastolic BP 59 68 64 65 65 64 67  Wt. (Lbs)  210  205 202  213.6  BMI  36.05 kg/m2  35.19 kg/m2 34.67 kg/m2  36.66 kg/m2           Relevant Medications   valsartan  (DIOVAN ) 80 MG tablet   amLODipine  (NORVASC ) 10 MG tablet     Endocrine   Uncontrolled type 2 diabetes mellitus with hyperglycemia (HCC)   Lab Results  Component Value Date   HGBA1C 8.2 (A) 04/16/2023  Continue Ozempic   0.  5 mg once weekly injection, Lantus  25 units daily Patient counseled on low-carb diet Encouraged engage in regular moderate exercises as tolerated Patient referred to the clinical pharmacist for assistance with management of type 2 diabetes.        Relevant Medications   valsartan   (DIOVAN ) 80 MG tablet   Continuous Glucose Sensor (FREESTYLE LIBRE 3 SENSOR) MISC   Continuous Glucose Receiver (FREESTYLE LIBRE 3 READER) DEVI   Other Relevant Orders   POCT glycosylated hemoglobin (Hb A1C) (Completed)   CMP14+EGFR   Direct LDL   AMB Referral [facility]BCI Care Management   Amb Referral to Nutrition and Diabetic Education     Genitourinary   Stage 3 chronic kidney disease (HCC)   Patient referred to nephrology Avoid NSAIDs and other nephrotoxic agents Lab Results  Component Value Date   NA 140 12/15/2022   K 5.6 (H) 12/15/2022   CO2 20 12/15/2022   GLUCOSE 308 (H) 12/15/2022   BUN 32 (H) 12/15/2022   CREATININE 1.69 (H) 12/15/2022   CALCIUM  9.4 12/15/2022   GFR 63.19 11/07/2014   EGFR 32 (L) 12/15/2022   GFRNONAA 36 (L) 04/07/2022         Relevant Orders   Ambulatory referral to Nephrology     Other   Dyslipidemia, goal LDL below 70   Currently on atorvastatin  20 mg daily Not fasting today ,checking direct LDL Avoid fatty fried foods Lab Results  Component Value Date   CHOL 156 06/26/2019   HDL 78 06/26/2019   LDLCALC 68 06/26/2019   LDLDIRECT 74 08/10/2007   TRIG 43 06/26/2019   CHOLHDL 2.0 06/26/2019         Relevant Medications   valsartan  (DIOVAN ) 80 MG tablet   amLODipine  (NORVASC ) 10 MG tablet    Outpatient Encounter Medications as of 04/16/2023  Medication Sig   acetaminophen  (TYLENOL ) 325 MG tablet Take 2 tablets (650 mg total) by mouth every 6 (six) hours as needed.   atorvastatin  (LIPITOR) 20 MG tablet Take 1 tablet (20 mg total) by mouth daily.   EQ ALLERGY RELIEF, CETIRIZINE , 10 MG tablet Take 1 tablet by mouth once daily   furosemide  (LASIX ) 20 MG tablet TAKE 1 TABLET EVERY DAY   gabapentin  (NEURONTIN ) 300 MG capsule Take 1 capsule by mouth twice daily   insulin  glargine (LANTUS  SOLOSTAR) 100 UNIT/ML Solostar Pen Inject 25 Units into the skin daily.   nystatin  (MYCOSTATIN /NYSTOP ) powder APPLY POWDER TOPICALLY TO AFFECTED AREA 4 TIMES  DAILY   omeprazole  (PRILOSEC) 20 MG capsule Take 1 capsule by mouth once daily   RELION PEN NEEDLE 31G/8MM 31G X 8 MM MISC USE AS DIRECTED 4 TIMES DAILY   Semaglutide ,0.25 or 0.5MG /DOS, (OZEMPIC , 0.25 OR 0.5 MG/DOSE,) 2 MG/3ML SOPN INJECT 0.5MG  UNDER THE SKIN ONE TIME WEEKLY   [DISCONTINUED] amLODipine  (NORVASC ) 10 MG tablet TAKE 1 TABLET EVERY DAY   [DISCONTINUED] valsartan  (DIOVAN ) 80 MG tablet Take 1 tablet (80 mg total) by mouth daily.   Accu-Chek Softclix Lancets lancets Use 1-4 times daily as needed DX E11.9 (Patient not taking: Reported on 12/15/2022)   amLODipine  (NORVASC ) 10 MG tablet Take 1 tablet (10 mg total) by mouth daily.   Blood Glucose Monitoring Suppl (ACCU-CHEK GUIDE) w/Device KIT 1 Device by Does not apply route 4 (four) times daily. (Patient not taking: Reported on 04/07/2022)   Continuous Glucose Receiver (FREESTYLE LIBRE 3 READER) DEVI Use to check blood sugar continously as directed   Continuous Glucose Sensor (FREESTYLE LIBRE 3 SENSOR) MISC  Place 1 sensor on the skin every 14 days. Use to check glucose continuously   diclofenac  Sodium (VOLTAREN ) 1 % GEL Apply 4 g topically 4 (four) times daily. (Patient not taking: Reported on 04/16/2023)   glucose blood (ACCU-CHEK GUIDE) test strip Use as instructed (Patient not taking: Reported on 04/07/2022)   hydrOXYzine  (ATARAX ) 10 MG tablet Take 1 tablet (10 mg total) by mouth 3 (three) times daily as needed. (Patient not taking: Reported on 04/16/2023)   lidocaine  (XYLOCAINE ) 2 % solution Use as directed 15 mLs in the mouth or throat every 2 (two) hours as needed for mouth pain. (Patient not taking: Reported on 04/16/2023)   Semaglutide , 1 MG/DOSE, 4 MG/3ML SOPN Inject 1 mg as directed once a week. (Patient not taking: Reported on 04/16/2023)   valsartan  (DIOVAN ) 80 MG tablet Take 1 tablet (80 mg total) by mouth daily.   [DISCONTINUED] Continuous Blood Gluc Receiver (FREESTYLE LIBRE 3 READER) DEVI Use to check blood sugar continously as directed  (Patient not taking: Reported on 04/16/2023)   [DISCONTINUED] Continuous Glucose Sensor (FREESTYLE LIBRE 3 SENSOR) MISC Place 1 sensor on the skin every 14 days. Use to check glucose continuously (Patient not taking: Reported on 04/16/2023)   [DISCONTINUED] triamcinolone  cream (KENALOG ) 0.1 % Apply 1 Application topically 2 (two) times daily. (Patient not taking: Reported on 04/16/2023)   [DISCONTINUED] triamcinolone  ointment (KENALOG ) 0.5 % APPLY  OINTMENT TOPICALLY TO AFFECTED AREA TWICE DAILY (Patient not taking: Reported on 04/16/2023)   Facility-Administered Encounter Medications as of 04/16/2023  Medication   insulin  NPH Human (NOVOLIN N) injection 20 Units    Follow-up: Return in about 4 weeks (around 05/14/2023) for HTN.   Vaughan Garfinkle R Rehaan Viloria, FNP

## 2023-04-16 NOTE — Patient Instructions (Signed)
 Please bring all your current medications with you to your next appointment  Around 3 times per week, check your blood pressure 2 times per day. once in the morning and once in the evening. The readings should be at least one minute apart. Write down these values and bring them to your next nurse visit/appointment.  When you check your BP, make sure you have been doing something calm/relaxing 5 minutes prior to checking. Both feet should be flat on the floor and you should be sitting. Use your left arm and make sure it is in a relaxed position (on a table), and that the cuff is at the approximate level/height of your heart.   Your blood pressure goal is less than 130/80  Goal for fasting blood sugar ranges from 80 to 120 and 2 hours after any meal or at bedtime should be between 130 to 170.    It is important that you exercise regularly at least 30 minutes 5 times a week as tolerated  Think about what you will eat, plan ahead. Choose  clean, green, fresh or frozen over canned, processed or packaged foods which are more sugary, salty and fatty. 70 to 75% of food eaten should be vegetables and fruit. Three meals at set times with snacks allowed between meals, but they must be fruit or vegetables. Aim to eat over a 12 hour period , example 7 am to 7 pm, and STOP after  your last meal of the day. Drink water ,generally about 64 ounces per day, no other drink is as healthy. Fruit juice is best enjoyed in a healthy way, by EATING the fruit.  Thanks for choosing [facility] we consider it a privelige to serve you.

## 2023-04-16 NOTE — Assessment & Plan Note (Signed)
 Patient referred to nephrology Avoid NSAIDs and other nephrotoxic agents Lab Results  Component Value Date   NA 140 12/15/2022   K 5.6 (H) 12/15/2022   CO2 20 12/15/2022   GLUCOSE 308 (H) 12/15/2022   BUN 32 (H) 12/15/2022   CREATININE 1.69 (H) 12/15/2022   CALCIUM  9.4 12/15/2022   GFR 63.19 11/07/2014   EGFR 32 (L) 12/15/2022   GFRNONAA 36 (L) 04/07/2022

## 2023-04-16 NOTE — Assessment & Plan Note (Signed)
 Currently on atorvastatin  20 mg daily Not fasting today ,checking direct LDL Avoid fatty fried foods Lab Results  Component Value Date   CHOL 156 06/26/2019   HDL 78 06/26/2019   LDLCALC 68 06/26/2019   LDLDIRECT 74 08/10/2007   TRIG 43 06/26/2019   CHOLHDL 2.0 06/26/2019

## 2023-04-16 NOTE — Assessment & Plan Note (Signed)
 Lab Results  Component Value Date   HGBA1C 8.2 (A) 04/16/2023  Continue Ozempic  0.  5 mg once weekly injection, Lantus  25 units daily Patient counseled on low-carb diet Encouraged engage in regular moderate exercises as tolerated Patient referred to the clinical pharmacist for assistance with management of type 2 diabetes.

## 2023-04-16 NOTE — Assessment & Plan Note (Signed)
 Continue amlodipine  10 mg daily, furosemide  20 mg daily , valsartan  80 mg daily .valsartan  80 mg daily refilled Patient encouraged to monitor blood pressure at home keep a log and bring to next visit in 4 weeks DASH diet and commitment to daily physical activity for a minimum of 30 minutes discussed and encouraged, as a part of hypertension management. The importance of attaining a healthy weight is also discussed.     04/16/2023   11:30 AM 04/16/2023   11:19 AM 01/12/2023   10:43 AM 01/12/2023   10:17 AM 12/15/2022   10:58 AM 07/09/2022   10:59 AM 07/09/2022   10:41 AM  BP/Weight  Systolic BP 163 168 151 153 143 153 162  Diastolic BP 59 68 64 65 65 64 67  Wt. (Lbs)  210  205 202  213.6  BMI  36.05 kg/m2  35.19 kg/m2 34.67 kg/m2  36.66 kg/m2

## 2023-04-17 LAB — LDL CHOLESTEROL, DIRECT: LDL Direct: 33 mg/dL (ref 0–99)

## 2023-04-17 LAB — CMP14+EGFR
ALT: 8 [IU]/L (ref 0–32)
AST: 14 [IU]/L (ref 0–40)
Albumin: 4.1 g/dL (ref 3.8–4.8)
Alkaline Phosphatase: 184 [IU]/L — ABNORMAL HIGH (ref 44–121)
BUN/Creatinine Ratio: 14 (ref 12–28)
BUN: 24 mg/dL (ref 8–27)
Bilirubin Total: 0.2 mg/dL (ref 0.0–1.2)
CO2: 24 mmol/L (ref 20–29)
Calcium: 9.5 mg/dL (ref 8.7–10.3)
Chloride: 103 mmol/L (ref 96–106)
Creatinine, Ser: 1.75 mg/dL — ABNORMAL HIGH (ref 0.57–1.00)
Globulin, Total: 2.5 g/dL (ref 1.5–4.5)
Glucose: 166 mg/dL — ABNORMAL HIGH (ref 70–99)
Potassium: 4.7 mmol/L (ref 3.5–5.2)
Sodium: 144 mmol/L (ref 134–144)
Total Protein: 6.6 g/dL (ref 6.0–8.5)
eGFR: 31 mL/min/{1.73_m2} — ABNORMAL LOW (ref >59)

## 2023-04-19 ENCOUNTER — Other Ambulatory Visit: Payer: Self-pay | Admitting: Nurse Practitioner

## 2023-04-19 DIAGNOSIS — R748 Abnormal levels of other serum enzymes: Secondary | ICD-10-CM | POA: Insufficient documentation

## 2023-04-20 ENCOUNTER — Encounter: Payer: Self-pay | Admitting: Podiatry

## 2023-04-20 ENCOUNTER — Ambulatory Visit (INDEPENDENT_AMBULATORY_CARE_PROVIDER_SITE_OTHER): Payer: Medicare HMO | Admitting: Podiatry

## 2023-04-20 ENCOUNTER — Telehealth: Payer: Self-pay

## 2023-04-20 DIAGNOSIS — L84 Corns and callosities: Secondary | ICD-10-CM

## 2023-04-20 DIAGNOSIS — B351 Tinea unguium: Secondary | ICD-10-CM

## 2023-04-20 DIAGNOSIS — M79675 Pain in left toe(s): Secondary | ICD-10-CM | POA: Diagnosis not present

## 2023-04-20 DIAGNOSIS — M79674 Pain in right toe(s): Secondary | ICD-10-CM

## 2023-04-20 DIAGNOSIS — E1142 Type 2 diabetes mellitus with diabetic polyneuropathy: Secondary | ICD-10-CM | POA: Diagnosis not present

## 2023-04-20 NOTE — Progress Notes (Signed)
This patient presents to the office with chief complaint of long thick painful nails.  Patient says the nails are painful walking and wearing shoes.  This patient is unable to self treat.  This patient is unable to trim her nails since she is unable to reach her nails. She has amputation big toe right foot.  She now experiences occasional pain under the outside ball of left foot.  She presents to the office for preventative foot care services.  General Appearance  Alert, conversant and in no acute stress.  Vascular  Dorsalis pedis and posterior tibial  pulses are palpable  bilaterally.  Capillary return is within normal limits  bilaterally. Temperature is within normal limits  bilaterally.  Neurologic  Senn-Weinstein monofilament wire test within normal limits  bilaterally. Muscle power within normal limits bilaterally.  Nails Thick disfigured discolored nails with subungual debris  from hallux to fifth toes left foot and 2-5 right foot. No evidence of bacterial infection or drainage bilaterally.  Orthopedic  No limitations of motion  feet .  No crepitus or effusions noted.  No bony pathology or digital deformities noted. 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  Skin  normotropic skin with no porokeratosis noted bilaterally.  No signs of infections or ulcers noted.     Onychomycosis  Nails .  Pain in right toes  Pain in left toes  Capsulitis 5th MPJ left foot.  Debridement of nails both feet followed trimming the nails with dremel tool.   She says she is awaiting diabetic shoes. Trish to call. RTC 3 months.   Helane Gunther DPM

## 2023-04-20 NOTE — Telephone Encounter (Unsigned)
Copied from CRM [id number]. Topic: Medical Record Request - Provider/Facility Request >> Apr 20, 2023  1:53 PM Ivette P wrote: Reason for CRM: Em called in to verify if Pt has been diagnosed with Cardiovascular Disease and also if the pt has been diagnosed with Diabetes. Requesting callback at [phone]  Reference #: [id number]

## 2023-04-22 ENCOUNTER — Telehealth: Payer: Self-pay

## 2023-04-22 NOTE — Telephone Encounter (Signed)
Ppw recvd from overseeing Dr. Kristin Bruins JEGEDE M.D for shoes  Faxing to [facility]. Shoe order re-opened and ppw faxed to [facility]  LM for patient as shoe choice is now not avail I will order another blue shoe [phone]

## 2023-04-25 ENCOUNTER — Other Ambulatory Visit: Payer: Self-pay | Admitting: Family Medicine

## 2023-04-25 DIAGNOSIS — K219 Gastro-esophageal reflux disease without esophagitis: Secondary | ICD-10-CM

## 2023-04-27 ENCOUNTER — Telehealth: Payer: Self-pay

## 2023-04-27 NOTE — Telephone Encounter (Signed)
 Left VM to schedule diabetic shoe pick up

## 2023-05-09 ENCOUNTER — Telehealth: Payer: Self-pay | Admitting: Nurse Practitioner

## 2023-05-09 NOTE — Telephone Encounter (Signed)
 Patient was identified as falling into the [place] Measure - Diabetes.   Patient was: Appointment scheduled with primary care provider in the next 30 days.

## 2023-05-10 ENCOUNTER — Other Ambulatory Visit: Payer: Medicare HMO

## 2023-05-10 ENCOUNTER — Other Ambulatory Visit: Payer: Self-pay | Admitting: Internal Medicine

## 2023-05-10 DIAGNOSIS — E1140 Type 2 diabetes mellitus with diabetic neuropathy, unspecified: Secondary | ICD-10-CM

## 2023-05-11 ENCOUNTER — Other Ambulatory Visit: Payer: Self-pay | Admitting: Family Medicine

## 2023-05-11 DIAGNOSIS — L304 Erythema intertrigo: Secondary | ICD-10-CM

## 2023-05-11 DIAGNOSIS — Z9109 Other allergy status, other than to drugs and biological substances: Secondary | ICD-10-CM

## 2023-05-12 ENCOUNTER — Telehealth: Payer: Self-pay

## 2023-05-12 DIAGNOSIS — Z79899 Other long term (current) drug therapy: Secondary | ICD-10-CM

## 2023-05-12 NOTE — Progress Notes (Signed)
   05/12/2023  Patient ID: Lenon Ahmadi, female   DOB: Jun 13, 1950, 73 y.o.   MRN: [id number]  Attempted to contact patient for medication management/review. Left HIPAA compliant message for patient to return my call at their convenience.   First attempt for patient outreach. Will follow up with patient in 3-5 business days.  Thank you for allowing pharmacy to be a part of this patient's care.  Cephus Shelling, PharmD Clinical Pharmacist Cell: [phone]

## 2023-05-14 ENCOUNTER — Other Ambulatory Visit: Payer: Self-pay

## 2023-05-14 NOTE — Telephone Encounter (Signed)
 PA is required for ozempic and tresebia. Will fax over to [facility] for review. Kh

## 2023-05-17 ENCOUNTER — Encounter: Payer: Self-pay | Admitting: Nurse Practitioner

## 2023-05-17 ENCOUNTER — Other Ambulatory Visit: Payer: Self-pay

## 2023-05-17 ENCOUNTER — Ambulatory Visit (INDEPENDENT_AMBULATORY_CARE_PROVIDER_SITE_OTHER): Payer: Self-pay | Admitting: Nurse Practitioner

## 2023-05-17 VITALS — BP 139/60 | HR 79 | Ht 64.0 in | Wt 203.0 lb

## 2023-05-17 DIAGNOSIS — E1165 Type 2 diabetes mellitus with hyperglycemia: Secondary | ICD-10-CM | POA: Diagnosis not present

## 2023-05-17 DIAGNOSIS — E785 Hyperlipidemia, unspecified: Secondary | ICD-10-CM | POA: Diagnosis not present

## 2023-05-17 DIAGNOSIS — Z1211 Encounter for screening for malignant neoplasm of colon: Secondary | ICD-10-CM | POA: Diagnosis not present

## 2023-05-17 DIAGNOSIS — E1142 Type 2 diabetes mellitus with diabetic polyneuropathy: Secondary | ICD-10-CM | POA: Diagnosis not present

## 2023-05-17 DIAGNOSIS — I10 Essential (primary) hypertension: Secondary | ICD-10-CM | POA: Diagnosis not present

## 2023-05-17 DIAGNOSIS — N1832 Chronic kidney disease, stage 3b: Secondary | ICD-10-CM | POA: Diagnosis not present

## 2023-05-17 DIAGNOSIS — E669 Obesity, unspecified: Secondary | ICD-10-CM

## 2023-05-17 MED ORDER — GABAPENTIN 300 MG PO CAPS: 300.0000 mg | ORAL_CAPSULE | Freq: Two times a day (BID) | ORAL | 1 refills | Status: DC

## 2023-05-17 MED ORDER — SEMAGLUTIDE (1 MG/DOSE) 4 MG/3ML ~~LOC~~ SOPN: 1.0000 mg | PEN_INJECTOR | SUBCUTANEOUS | 0 refills | Status: DC

## 2023-05-17 NOTE — Assessment & Plan Note (Signed)
 Lab Results  Component Value Date   NA 144 04/16/2023   K 4.7 04/16/2023   CO2 24 04/16/2023   GLUCOSE 166 (H) 04/16/2023   BUN 24 04/16/2023   CREATININE 1.75 (H) 04/16/2023   CALCIUM 9.5 04/16/2023   GFR 63.19 11/07/2014   EGFR 31 (L) 04/16/2023   GFRNONAA 36 (L) 04/07/2022  Avoid NSAIDs and other nephrotoxic agents Drink at least 64 ounces of water daily to maintain hydration Need to follow-up with nephrology discussed Phone number for [facility] provided today

## 2023-05-17 NOTE — Assessment & Plan Note (Signed)
Continue gabapentin 300 mg twice daily

## 2023-05-17 NOTE — Assessment & Plan Note (Addendum)
 Wt Readings from Last 3 Encounters:  05/17/23 203 lb (92.1 kg)  04/16/23 210 lb (95.3 kg)  01/12/23 205 lb (93 kg)    Body mass index is 34.84 kg/m.   She has been doing some yoga, she exercises 25 minutes twice daily, also walking a couple of blocks daily, she has been avoiding sweets, bread, eating more vegetables Patient has lost about 7 pounds since her last visit Patient counseled on low-carb diet Encouraged to engage in regular moderate exercises at least 150 minutes weekly as tolerated

## 2023-05-17 NOTE — Assessment & Plan Note (Signed)
 Continue Lantus 25 units daily Ozempic 0.5 mg once weekly injection, patient encouraged to make sure to pick up prescription for the 1 mg dose after running out of the 0.5 mg dose Patient counseled on low-carb diet CBG goals discussed She has been referred to the clinical pharmacist for assistance with management of type 2 diabetes She was encouraged to get her diabetes eye exam completed, phone number  for ophthalmology office provided Follow-up with the nutritionist for diabetes education Will recheck A1c in 2 months

## 2023-05-17 NOTE — Patient Instructions (Addendum)
 Goal for fasting blood sugar ranges from 80 to 120 and 2 hours after any meal or at bedtime should be between 130 to 170.    Around 3 times per week, check your blood pressure 2 times per day. once in the morning and once in the evening. The readings should be at least one minute apart. Write down these values and bring them to your next nurse visit/appointment.  When you check your BP, make sure you have been doing something calm/relaxing 5 minutes prior to checking. Both feet should be flat on the floor and you should be sitting. Use your left arm and make sure it is in a relaxed position (on a table), and that the cuff is at the approximate level/height of your heart. Blood pressure goal is less than 130/80  Please consider getting Shingrix  vaccine at local pharmacy.    Eye doctor   [address] [place] [address]  P:  [phone] F:  [phone] .   [facility]   [address] [name] [address]  P:  [phone] F:  [phone] .   1. Screening for colon cancer (Primary)  - Cologuard  2. Essential hypertension   3. Obesity (BMI 30-39.9)   4. Stage 3b chronic kidney disease (HCC)   5. Uncontrolled type 2 diabetes mellitus with hyperglycemia (HCC)    It is important that you exercise regularly at least 30 minutes 5 times a week as tolerated  Think about what you will eat, plan ahead. Choose " clean, green, fresh or frozen" over canned, processed or packaged foods which are more sugary, salty and fatty. 70 to 75% of food eaten should be vegetables and fruit. Three meals at set times with snacks allowed between meals, but they must be fruit or vegetables. Aim to eat over a 12 hour period , example 7 am to 7 pm, and STOP after  your last meal of the day. Drink water,generally about 64 ounces per day, no other drink is as healthy. Fruit juice is best enjoyed in a healthy way, by EATING the fruit.  Thanks for choosing [facility] we  consider it a privelige to serve you.

## 2023-05-17 NOTE — Assessment & Plan Note (Addendum)
 BP Readings from Last 3 Encounters:  05/17/23 139/60  04/16/23 (!) 163/59  01/12/23 (!) 151/64  Systolic blood pressure is high but diastolic blood pressure is well-controlled Blood pressure goal is less than 130/80 Continue amlodipine 10 mg daily, valsartan 80 mg daily, furosemide 20 mg daily Patient encouraged to follow-up with nephrologist Discussed DASH diet and dietary sodium restrictions Continue to increase dietary efforts and exercise.

## 2023-05-17 NOTE — Progress Notes (Signed)
 Established Patient Office Visit  Subjective:  Patient ID: [name], female    DOB: 1951/01/17  Age: 73 y.o. MRN: [id number]  CC:  Chief Complaint  Patient presents with   Medical Management of Chronic Issues   Diabetes   Hypertension    HPI Rachel Gonzalez is a 73 y.o. female  has a past medical history of Anemia, Arthritis, CHF (congestive heart failure) (HCC), Chronic renal disease, stage 3, moderately decreased glomerular filtration rate (GFR) between 30-59 mL/min/1.73 square meter (HCC), Diabetes mellitus, GERD (gastroesophageal reflux disease), and Hypertension.  Patient presents for follow-up for her chronic medical conditions Today she brought on her current medications with her  Hypertension.  Currently on amlodipine 10 mg daily, valsartan 80 mg daily furosemide 20 mg daily.  She denies chest pain, shortness of breath, edema  Uncontrolled type 2 diabetes.  Currently on Lantus 25 units daily, Ozempic 0.5 mg once weekly injection.  She is supposed to be on Ozempic 1 mg once weekly but still has Ozempic 0.5 mg dose at home, she plans getting the 1 mg dose at her next med  refill. she has been using a glucose meter to check her blood sugar, stated that the co-pay for a CGM is too expensive for her.  She cannot tell me what her fasting blood sugar readings has been at home.  She denies polyuria polyphagia polydipsia  CKD.  Patient was referred to nephrologist but states that she has not been seen.  Stated that she missed a couple of calls which she thinks might be from the nephrologist office, she plans to return their calls  Patient encouraged to get shingles vaccine at the pharmacy, Cologuard ordered to screen for colon cancer, she prefers Cologuard to colonoscopy.  Patient declined bone density scan.   Past Medical History:  Diagnosis Date   Anemia    Arthritis    right finger   CHF (congestive heart failure) (HCC)    Chronic renal disease, stage 3, moderately  decreased glomerular filtration rate (GFR) between 30-59 mL/min/1.73 square meter (HCC)    Diabetes mellitus    GERD (gastroesophageal reflux disease)    Hypertension     Past Surgical History:  Procedure Laterality Date   AMPUTATION Right 05/08/2014   Procedure: AMPUTATION RAY, right great toe;  Surgeon: Nadara Mustard, MD;  Location: [facility] OR;  Service: Orthopedics;  Laterality: Right;   arm surgery      CESAREAN SECTION     COLONOSCOPY     I & D EXTREMITY Right 05/08/2014   Procedure: IRRIGATION AND DEBRIDEMENT FOOT;  Surgeon: Nadara Mustard, MD;  Location: [facility] OR;  Service: Orthopedics;  Laterality: Right;   MEMBRANE PEEL Right 03/15/2018   Procedure: MEMBRANE PEEL;  Surgeon: Carmela Rima, MD;  Location: [facility];  Service: Ophthalmology;  Laterality: Right;   PHOTOCOAGULATION WITH LASER Right 03/15/2018   Procedure: PHOTOCOAGULATION WITH LASER;  Surgeon: Carmela Rima, MD;  Location: [facility] OR;  Service: Ophthalmology;  Laterality: Right;   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/2018   Procedure: RIGHT EYE COMPLEX RETINA DETACHMENT VITRECTOMY MEMBRANE PEEL WITH AIR,GAS,SILICONE OIL PHOTOCOAGULATION;  Surgeon: Carmela Rima, MD;  Location: [facility] OR;  Service: Ophthalmology;  Laterality: Right;    Family History  Problem Relation Age of Onset   Diabetes Mother    Breast cancer Neg Hx     Social History   Socioeconomic History   Marital status: Married    Spouse name: Not on file  Number of children: Not on file   Years of education: Not on file   Highest education level: Not on file  Occupational History   Not on file  Tobacco Use   Smoking status: Never   Smokeless tobacco: Never  Vaping Use   Vaping status: Never Used  Substance and Sexual Activity   Alcohol use: No   Drug use: No   Sexual activity: Not on file  Other Topics Concern   Not on file  Social History Narrative   From [place].   Married.   Three children, 9 grandchildren.   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   Travels to [place], [address]   Social Drivers of Health   Financial Resource Strain: Low Risk  (06/11/2022)   Overall Financial Resource Strain (CARDIA)    Difficulty of Paying Living Expenses: Not hard at all  Food Insecurity: No Food Insecurity (06/11/2022)   Hunger Vital Sign    Worried About Running Out of Food in the Last Year: Never true    Ran Out of Food in the Last Year: Never true  Transportation Needs: No Transportation Needs (06/11/2022)   PRAPARE - Administrator, Civil Service (Medical): No    Lack of Transportation (Non-Medical): No  Physical Activity: Sufficiently Active (06/11/2022)   Exercise Vital Sign    Days of Exercise per Week: 3 days    Minutes of Exercise per Session: 60 min  Stress: No Stress Concern Present (06/11/2022)   [employer] of Occupational Health - Occupational Stress Questionnaire    Feeling of Stress : Not at all  Social Connections: Socially Integrated (06/11/2022)   Social Connection and Isolation Panel [NHANES]    Frequency of Communication with Friends and Family: More than three times a week    Frequency of Social Gatherings with Friends and Family: More than three times a week    Attends Religious Services: More than 4 times per year    Active Member of [employer] or Organizations: Yes    Attends Engineer, structural: More than 4 times per year    Marital Status: Married  Catering manager Violence: Not At Risk (06/11/2022)   Humiliation, Afraid, Rape, and Kick questionnaire    Fear of Current or Ex-Partner: No    Emotionally Abused: No    Physically Abused: No    Sexually Abused: No    Outpatient Medications Prior to Visit  Medication Sig Dispense Refill   acetaminophen (TYLENOL) 325 MG tablet Take 2 tablets (650 mg total) by mouth every 6 (six) hours as needed. 36 tablet 0   amLODipine (NORVASC) 10 MG tablet Take 1 tablet (10 mg total) by mouth daily.  90 tablet 3   atorvastatin (LIPITOR) 20 MG tablet Take 1 tablet (20 mg total) by mouth daily. 90 tablet 3   furosemide (LASIX) 20 MG tablet TAKE 1 TABLET EVERY DAY 90 tablet 3   gabapentin (NEURONTIN) 300 MG capsule Take 1 capsule by mouth twice daily 90 capsule 0   insulin glargine (LANTUS SOLOSTAR) 100 UNIT/ML Solostar Pen Inject 25 Units into the skin daily. 45 mL 1   nystatin (MYCOSTATIN/NYSTOP) powder APPLY POWDER TOPICALLY TO AFFECTED AREA 4 TIMES DAILY 15 g 0   omeprazole (PRILOSEC) 20 MG capsule Take 1 capsule by mouth once daily 90 capsule 0   RELION PEN NEEDLE 31G/8MM 31G X 8 MM MISC USE AS DIRECTED 4 TIMES DAILY 100 each 11  valsartan (DIOVAN) 80 MG tablet Take 1 tablet (80 mg total) by mouth daily. 90 tablet 1   Semaglutide, 1 MG/DOSE, 4 MG/3ML SOPN Inject 1 mg as directed once a week. 3 mL 5   EQ ALLERGY RELIEF, CETIRIZINE, 10 MG tablet Take 1 tablet by mouth once daily 90 tablet 0   Accu-Chek Softclix Lancets lancets Use 1-4 times daily as needed DX E11.9 (Patient not taking: Reported on 12/15/2022) 360 each 3   Blood Glucose Monitoring Suppl (ACCU-CHEK GUIDE) w/Device KIT 1 Device by Does not apply route 4 (four) times daily. (Patient not taking: Reported on 04/07/2022) 1 kit 0   Continuous Glucose Receiver (FREESTYLE LIBRE 3 READER) DEVI Use to check blood sugar continously as directed 1 each 0   Continuous Glucose Sensor (FREESTYLE LIBRE 3 SENSOR) MISC Place 1 sensor on the skin every 14 days. Use to check glucose continuously 6 each 3   diclofenac Sodium (VOLTAREN) 1 % GEL Apply 4 g topically 4 (four) times daily. (Patient not taking: Reported on 04/16/2023) 100 g 2   glucose blood (ACCU-CHEK GUIDE) test strip Use as instructed (Patient not taking: Reported on 04/07/2022) 100 each 12   hydrOXYzine (ATARAX) 10 MG tablet Take 1 tablet (10 mg total) by mouth 3 (three) times daily as needed. (Patient not taking: Reported on 04/16/2023) 30 tablet 0   lidocaine (XYLOCAINE) 2 % solution Use as  directed 15 mLs in the mouth or throat every 2 (two) hours as needed for mouth pain. (Patient not taking: Reported on 04/16/2023) 100 mL 0   Semaglutide,0.25 or 0.5MG /DOS, (OZEMPIC, 0.25 OR 0.5 MG/DOSE,) 2 MG/3ML SOPN INJECT 0.5MG  UNDER THE SKIN ONE TIME WEEKLY 3 mL 3   triamcinolone ointment (KENALOG) 0.5 % APPLY OINTMENT TOPICALLY TO AFFECTED AREA TWICE DAILY 30 g 0   insulin NPH Human (NOVOLIN N) injection 20 Units      No facility-administered medications prior to visit.    Allergies  Allergen Reactions   Tramadol Nausea And Vomiting   Adhesive [Tape] Itching   Bactrim [Sulfamethoxazole-Trimethoprim] Other (See Comments)    AKI and hyperkalemia    Tylenol With Codeine #3 [Acetaminophen-Codeine] Diarrhea and Itching    Only allergic to Codeine, patient reports no allergy to Tylenol.    ROS Review of Systems  Constitutional:  Negative for appetite change, chills, fatigue and fever.  HENT:  Negative for congestion, postnasal drip, rhinorrhea and sneezing.   Respiratory:  Negative for cough, shortness of breath and wheezing.   Cardiovascular:  Negative for chest pain, palpitations and leg swelling.  Gastrointestinal:  Negative for abdominal pain, constipation, nausea and vomiting.  Genitourinary:  Negative for difficulty urinating, dysuria, flank pain and frequency.  Musculoskeletal:  Negative for arthralgias, back pain, joint swelling and myalgias.  Skin:  Negative for color change, pallor, rash and wound.  Neurological:  Negative for dizziness, facial asymmetry, weakness, numbness and headaches.  Psychiatric/Behavioral:  Negative for behavioral problems, confusion, self-injury and suicidal ideas.       Objective:    Physical Exam Vitals and nursing note reviewed.  Constitutional:      General: She is not in acute distress.    Appearance: Normal appearance. She is obese. She is not ill-appearing, toxic-appearing or diaphoretic.  HENT:     Mouth/Throat:     Mouth: Mucous  membranes are moist.     Pharynx: Oropharynx is clear. No oropharyngeal exudate or posterior oropharyngeal erythema.  Eyes:     General: No scleral icterus.  Right eye: No discharge.        Left eye: No discharge.     Extraocular Movements: Extraocular movements intact.     Conjunctiva/sclera: Conjunctivae normal.  Cardiovascular:     Rate and Rhythm: Normal rate and regular rhythm.     Pulses: Normal pulses.     Heart sounds: Normal heart sounds. No murmur heard.    No friction rub. No gallop.  Pulmonary:     Effort: Pulmonary effort is normal. No respiratory distress.     Breath sounds: Normal breath sounds. No stridor. No wheezing, rhonchi or rales.  Chest:     Chest wall: No tenderness.  Abdominal:     General: There is no distension.     Palpations: Abdomen is soft.     Tenderness: There is no abdominal tenderness. There is no right CVA tenderness, left CVA tenderness or guarding.  Musculoskeletal:        General: No swelling, tenderness, deformity or signs of injury.     Right lower leg: No edema.     Left lower leg: No edema.  Skin:    General: Skin is warm and dry.     Capillary Refill: Capillary refill takes less than 2 seconds.     Coloration: Skin is not jaundiced or pale.     Findings: No bruising, erythema or lesion.  Neurological:     Mental Status: She is alert and oriented to person, place, and time.     Motor: No weakness.     Coordination: Coordination normal.     Gait: Gait normal.  Psychiatric:        Mood and Affect: Mood normal.        Behavior: Behavior normal.        Thought Content: Thought content normal.        Judgment: Judgment normal.     BP 139/60   Pulse 79   Ht 5\' 4"  (1.626 m)   Wt 203 lb (92.1 kg)   BMI 34.84 kg/m  Wt Readings from Last 3 Encounters:  05/17/23 203 lb (92.1 kg)  04/16/23 210 lb (95.3 kg)  01/12/23 205 lb (93 kg)    Lab Results  Component Value Date   TSH 0.838 04/10/2020   Lab Results  Component Value  Date   WBC 5.2 12/15/2022   HGB 10.4 (L) 12/15/2022   HCT 34.3 12/15/2022   MCV 89 12/15/2022   PLT 225 12/15/2022   Lab Results  Component Value Date   NA 144 04/16/2023   K 4.7 04/16/2023   CO2 24 04/16/2023   GLUCOSE 166 (H) 04/16/2023   BUN 24 04/16/2023   CREATININE 1.75 (H) 04/16/2023   BILITOT 0.2 04/16/2023   ALKPHOS 184 (H) 04/16/2023   AST 14 04/16/2023   ALT 8 04/16/2023   PROT 6.6 04/16/2023   ALBUMIN 4.1 04/16/2023   CALCIUM 9.5 04/16/2023   ANIONGAP 8 04/07/2022   EGFR 31 (L) 04/16/2023   GFR 63.19 11/07/2014   Lab Results  Component Value Date   CHOL 156 06/26/2019   Lab Results  Component Value Date   HDL 78 06/26/2019   Lab Results  Component Value Date   LDLCALC 68 06/26/2019   Lab Results  Component Value Date   TRIG 43 06/26/2019   Lab Results  Component Value Date   CHOLHDL 2.0 06/26/2019   Lab Results  Component Value Date   HGBA1C 8.2 (A) 04/16/2023      Assessment & Plan:  Problem List Items Addressed This Visit       Cardiovascular and Mediastinum   Essential hypertension - Primary   BP Readings from Last 3 Encounters:  05/17/23 139/60  04/16/23 (!) 163/59  01/12/23 (!) 151/64  Systolic blood pressure is high but diastolic blood pressure is well-controlled Blood pressure goal is less than 130/80 Continue amlodipine 10 mg daily, valsartan 80 mg daily, furosemide 20 mg daily Patient encouraged to follow-up with nephrologist Discussed DASH diet and dietary sodium restrictions Continue to increase dietary efforts and exercise.            Endocrine   Uncontrolled type 2 diabetes mellitus with hyperglycemia (HCC)   Continue Lantus 25 units daily Ozempic 0.5 mg once weekly injection, patient encouraged to make sure to pick up prescription for the 1 mg dose after running out of the 0.5 mg dose Patient counseled on low-carb diet CBG goals discussed She has been referred to the clinical pharmacist for assistance with  management of type 2 diabetes She was encouraged to get her diabetes eye exam completed, phone number  for ophthalmology office provided Follow-up with the nutritionist for diabetes education Will recheck A1c in 2 months      Relevant Medications   Semaglutide, 1 MG/DOSE, 4 MG/3ML SOPN (Start on 06/14/2023)   Diabetic polyneuropathy associated with type 2 diabetes mellitus (HCC)   Continue gabapentin 300 mg twice daily      Relevant Medications   Semaglutide, 1 MG/DOSE, 4 MG/3ML SOPN (Start on 06/14/2023)     Genitourinary   Stage 3 chronic kidney disease (HCC)   Lab Results  Component Value Date   NA 144 04/16/2023   K 4.7 04/16/2023   CO2 24 04/16/2023   GLUCOSE 166 (H) 04/16/2023   BUN 24 04/16/2023   CREATININE 1.75 (H) 04/16/2023   CALCIUM 9.5 04/16/2023   GFR 63.19 11/07/2014   EGFR 31 (L) 04/16/2023   GFRNONAA 36 (L) 04/07/2022  Avoid NSAIDs and other nephrotoxic agents Drink at least 64 ounces of water daily to maintain hydration Need to follow-up with nephrology discussed Phone number for [facility] provided today        Other   Obesity (BMI 30-39.9) (Chronic)   Wt Readings from Last 3 Encounters:  05/17/23 203 lb (92.1 kg)  04/16/23 210 lb (95.3 kg)  01/12/23 205 lb (93 kg)    Body mass index is 34.84 kg/m.   She has been doing some yoga, she exercises 25 minutes twice daily, also walking a couple of blocks daily, she has been avoiding sweets, bread, eating more vegetables Patient has lost about 7 pounds since her last visit Patient counseled on low-carb diet Encouraged to engage in regular moderate exercises at least 150 minutes weekly as tolerated      Relevant Medications   Semaglutide, 1 MG/DOSE, 4 MG/3ML SOPN (Start on 06/14/2023)   Screening for colon cancer   Relevant Orders   Cologuard   Dyslipidemia, goal LDL below 70   Lab Results  Component Value Date   CHOL 156 06/26/2019   HDL 78 06/26/2019   LDLCALC 68 06/26/2019    LDLDIRECT 33 04/16/2023   TRIG 43 06/26/2019   CHOLHDL 2.0 06/26/2019  Currently well-controlled on atorvastatin 20 mg daily Continue current medication       Meds ordered this encounter  Medications   Semaglutide, 1 MG/DOSE, 4 MG/3ML SOPN    Sig: Inject 1 mg as directed once a week.    Dispense:  3  mL    Refill:  0    Follow-up: Return in about 2 months (around 07/17/2023) for DM.    Donell Beers, FNP

## 2023-05-17 NOTE — Assessment & Plan Note (Signed)
 Lab Results  Component Value Date   CHOL 156 06/26/2019   HDL 78 06/26/2019   LDLCALC 68 06/26/2019   LDLDIRECT 33 04/16/2023   TRIG 43 06/26/2019   CHOLHDL 2.0 06/26/2019  Currently well-controlled on atorvastatin 20 mg daily Continue current medication

## 2023-05-28 ENCOUNTER — Other Ambulatory Visit: Payer: Self-pay

## 2023-06-05 ENCOUNTER — Other Ambulatory Visit: Payer: Self-pay | Admitting: Family Medicine

## 2023-06-05 DIAGNOSIS — I10 Essential (primary) hypertension: Secondary | ICD-10-CM

## 2023-06-05 DIAGNOSIS — E785 Hyperlipidemia, unspecified: Secondary | ICD-10-CM

## 2023-07-06 ENCOUNTER — Ambulatory Visit: Payer: Self-pay

## 2023-07-06 ENCOUNTER — Ambulatory Visit (INDEPENDENT_AMBULATORY_CARE_PROVIDER_SITE_OTHER): Payer: Self-pay

## 2023-07-06 ENCOUNTER — Other Ambulatory Visit: Payer: Self-pay

## 2023-07-06 VITALS — Ht 63.0 in | Wt 203.0 lb

## 2023-07-06 DIAGNOSIS — Z13820 Encounter for screening for osteoporosis: Secondary | ICD-10-CM

## 2023-07-06 DIAGNOSIS — Z0000 Encounter for general adult medical examination without abnormal findings: Secondary | ICD-10-CM

## 2023-07-06 MED ORDER — TRIAMCINOLONE ACETONIDE 0.5 % EX OINT: 1.0000 | TOPICAL_OINTMENT | Freq: Two times a day (BID) | CUTANEOUS | 0 refills | Status: AC

## 2023-07-06 NOTE — Telephone Encounter (Signed)
 Please advise [facility]

## 2023-07-06 NOTE — Progress Notes (Signed)
 Subjective:   Rachel Gonzalez is a 73 y.o. female who presents for Medicare Annual (Subsequent) preventive examination.  Visit Complete: Virtual I connected with  Mauri Sous on 07/06/23 by a audio enabled telemedicine application and verified that I am speaking with the correct person using two identifiers.  Patient Location: Home  Provider Location: Office/Clinic  I discussed the limitations of evaluation and management by telemedicine. The patient expressed understanding and agreed to proceed.  Vital Signs: Because this visit was a virtual/telehealth visit, some criteria may be missing or patient reported. Any vitals not documented were not able to be obtained and vitals that have been documented are patient reported.  Patient Medicare AWV questionnaire was completed by the patient on 07/06/23; I have confirmed that all information answered by patient is correct and no changes since this date.  Cardiac Risk Factors include: diabetes mellitus;hypertension;obesity (BMI >30kg/m2)     Objective:    Today's Vitals   07/06/23 0945  Weight: 203 lb (92.1 kg)  Height: 5\' 3"  (1.6 m)   Body mass index is 35.96 kg/m.     07/06/2023    9:59 AM 06/11/2022    3:25 PM 11/21/2021   12:17 PM 07/11/2020   10:52 AM 04/24/2020    2:17 PM 04/11/2020    5:18 PM 05/30/2019    5:52 PM  Advanced Directives  Does Patient Have a Medical Advance Directive? No No No No No No No  Would patient like information on creating a medical advance directive? No - Patient declined No - Patient declined No - Patient declined No - Patient declined No - Patient declined No - Patient declined Yes (ED - Information included in AVS)    Current Medications (verified) Outpatient Encounter Medications as of 07/06/2023  Medication Sig   acetaminophen  (TYLENOL ) 325 MG tablet Take 2 tablets (650 mg total) by mouth every 6 (six) hours as needed.   amLODipine  (NORVASC ) 10 MG tablet TAKE 1 TABLET EVERY DAY   atorvastatin   (LIPITOR) 20 MG tablet TAKE 1 TABLET EVERY DAY   EQ ALLERGY RELIEF, CETIRIZINE , 10 MG tablet Take 1 tablet by mouth once daily   furosemide  (LASIX ) 20 MG tablet TAKE 1 TABLET EVERY DAY   gabapentin  (NEURONTIN ) 300 MG capsule Take 1 capsule (300 mg total) by mouth 2 (two) times daily.   insulin  glargine (LANTUS  SOLOSTAR) 100 UNIT/ML Solostar Pen Inject 25 Units into the skin daily.   nystatin  (MYCOSTATIN /NYSTOP ) powder APPLY POWDER TOPICALLY TO AFFECTED AREA 4 TIMES DAILY   omeprazole  (PRILOSEC) 20 MG capsule Take 1 capsule by mouth once daily   RELION PEN NEEDLE 31G/8MM 31G X 8 MM MISC USE AS DIRECTED 4 TIMES DAILY   valsartan  (DIOVAN ) 80 MG tablet Take 1 tablet (80 mg total) by mouth daily.   Semaglutide , 1 MG/DOSE, 4 MG/3ML SOPN Inject 1 mg as directed once a week. (Patient not taking: Reported on 07/06/2023)   No facility-administered encounter medications on file as of 07/06/2023.    Allergies (verified) Tramadol, Adhesive [tape], Bactrim  [sulfamethoxazole -trimethoprim ], and Tylenol  with codeine  #3 [acetaminophen -codeine ]   History: Past Medical History:  Diagnosis Date   Anemia    Arthritis    right finger   CHF (congestive heart failure) (HCC)    Chronic renal disease, stage 3, moderately decreased glomerular filtration rate (GFR) between 30-59 mL/min/1.73 square meter (HCC)    Diabetes mellitus    GERD (gastroesophageal reflux disease)    Hypertension    Past Surgical History:  Procedure Laterality  Date   AMPUTATION Right 05/08/2014   Procedure: AMPUTATION RAY, right great toe;  Surgeon: Timothy Ford, MD;  Location: [facility] OR;  Service: Orthopedics;  Laterality: Right;   arm surgery      CESAREAN SECTION     COLONOSCOPY     I & D EXTREMITY Right 05/08/2014   Procedure: IRRIGATION AND DEBRIDEMENT FOOT;  Surgeon: Timothy Ford, MD;  Location: [facility] OR;  Service: Orthopedics;  Laterality: Right;   MEMBRANE PEEL Right 03/15/2018   Procedure: MEMBRANE PEEL;  Surgeon: Jearline Minder, MD;   Location: [facility] OR;  Service: Ophthalmology;  Laterality: Right;   PHOTOCOAGULATION WITH LASER Right 03/15/2018   Procedure: PHOTOCOAGULATION WITH LASER;  Surgeon: Jearline Minder, MD;  Location: [facility] OR;  Service: Ophthalmology;  Laterality: Right;   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/2018   Procedure: RIGHT EYE COMPLEX RETINA DETACHMENT VITRECTOMY MEMBRANE PEEL WITH AIR,GAS,SILICONE OIL PHOTOCOAGULATION;  Surgeon: Jearline Minder, MD;  Location: [facility] OR;  Service: Ophthalmology;  Laterality: Right;   Family History  Problem Relation Age of Onset   Diabetes Mother    Breast cancer Neg Hx    Social History   Socioeconomic History   Marital status: Married    Spouse name: Not on file   Number of children: Not on file   Years of education: Not on file   Highest education level: Not on file  Occupational History   Not on file  Tobacco Use   Smoking status: Never   Smokeless tobacco: Never  Vaping Use   Vaping status: Never Used  Substance and Sexual Activity   Alcohol use: No   Drug use: No   Sexual activity: Not on file  Other Topics Concern   Not on file  Social History Narrative   From [place] .   Married.   Three children, 9 grandchildren.   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   Travels to [place], [address]   Social Drivers of Health   Financial Resource Strain: Low Risk  (07/06/2023)   Overall Financial Resource Strain (CARDIA)    Difficulty of Paying Living Expenses: Not hard at all  Food Insecurity: No Food Insecurity (07/06/2023)   Hunger Vital Sign    Worried About Running Out of Food in the Last Year: Never true    Ran Out of Food in the Last Year: Never true  Transportation Needs: No Transportation Needs (07/06/2023)   PRAPARE - Administrator, Civil Service (Medical): No    Lack of Transportation (Non-Medical): No  Physical Activity: Insufficiently Active (07/06/2023)   Exercise Vital  Sign    Days of Exercise per Week: 2 days    Minutes of Exercise per Session: 10 min  Stress: No Stress Concern Present (07/06/2023)   [employer] of Occupational Health - Occupational Stress Questionnaire    Feeling of Stress : Not at all  Social Connections: Socially Integrated (07/06/2023)   Social Connection and Isolation Panel [NHANES]    Frequency of Communication with Friends and Family: More than three times a week    Frequency of Social Gatherings with Friends and Family: More than three times a week    Attends Religious Services: More than 4 times per year    Active Member of [employer] or Organizations: No    Attends Engineer, structural: More than 4 times per year    Marital Status: Married    Tobacco Counseling Counseling given:  Not Answered   Clinical Intake:  Pre-visit preparation completed: Yes  Pain : No/denies pain     BMI - recorded: 35.96 Nutritional Status: BMI > 30  Obese Nutritional Risks: None Diabetes: Yes CBG done?: No Did pt. bring in CBG monitor from home?: No  How often do you need to have someone help you when you read instructions, pamphlets, or other written materials from your doctor or pharmacy?: 2 - Rarely What is the last grade level you completed in school?: 12  Interpreter Needed?: No      Activities of Daily Living    07/06/2023    9:48 AM  In your present state of health, do you have any difficulty performing the following activities:  Hearing? 0  Vision? 0  Difficulty concentrating or making decisions? 0  Walking or climbing stairs? 0  Dressing or bathing? 0  Doing errands, shopping? 0  Preparing Food and eating ? N  Using the Toilet? N  In the past six months, have you accidently leaked urine? N  Do you have problems with loss of bowel control? N  Managing your Medications? N  Managing your Finances? N  Housekeeping or managing your Housekeeping? N    Patient Care Team: [name], [name], FNP as PCP -  General (Nurse Practitioner)  Indicate any recent Medical Services you may have received from other than [facility] providers in the past year (date may be approximate).     Assessment:   This is a routine wellness examination for Ranyia.  Hearing/Vision screen No results found.   Goals Addressed               This Visit's Progress     DIET - EAT MORE FRUITS AND VEGETABLES (pt-stated)         Depression Screen    07/06/2023    9:57 AM 04/16/2023   11:25 AM 06/11/2022    3:22 PM 04/07/2022   10:12 AM 01/06/2022    9:07 AM 12/10/2020   11:01 AM 07/11/2020   10:54 AM  PHQ 2/9 Scores  PHQ - 2 Score 0 0 0 1 0 0 0  PHQ- 9 Score     3      Fall Risk    07/06/2023   10:00 AM 06/11/2022    3:23 PM 07/01/2021   10:48 AM 12/10/2020   11:01 AM 07/11/2020   10:53 AM  Fall Risk   Falls in the past year? 0 1 0 1 1  Number falls in past yr: 0 0 0 0 1  Injury with Fall? 0 1 0 0 1  Comment  Injured pelvis. Followed by medical attention     Risk for fall due to : No Fall Risks No Fall Risks;Other (Comment) No Fall Risks  History of fall(s)  Risk for fall due to: Comment  Tripped over a cart     Follow up Falls evaluation completed Falls prevention discussed Falls evaluation completed  Falls prevention discussed;Education provided    MEDICARE RISK AT HOME: Medicare Risk at Home Any stairs in or around the home?: No If so, are there any without handrails?: No Home free of loose throw rugs in walkways, pet beds, electrical cords, etc?: No Adequate lighting in your home to reduce risk of falls?: Yes Life alert?: No Use of a cane, walker or w/c?: Yes Grab bars in the bathroom?: Yes Shower chair or bench in shower?: No Elevated toilet seat or a handicapped toilet?: No  TIMED UP AND  GO:  Was the test performed?  No    Cognitive Function:        07/06/2023   10:01 AM 06/11/2022    3:25 PM  6CIT Screen  What Year? 0 points 0 points  What month? 0 points 0 points  What time? 0 points 0  points  Count back from 20 0 points 0 points  Months in reverse 0 points 0 points  Repeat phrase 2 points 0 points  Total Score 2 points 0 points    Immunizations Immunization History  Administered Date(s) Administered   Fluad Quad(high Dose 65+) 12/15/2022   Fluad Trivalent(High Dose 65+) 12/15/2022   Influenza,inj,Quad PF,6+ Mos 12/10/2020   Influenza-Unspecified 10/29/2021   PFIZER(Purple Top)SARS-COV-2 Vaccination 05/08/2019, 06/06/2019, 12/07/2020   Pneumococcal Conjugate-13 05/23/2019   Pneumococcal Polysaccharide-23 02/12/2015, 01/06/2022   Td 05/07/2014   Tdap 11/21/2007    TDAP status: Up to date  Flu Vaccine status: Up to date  Pneumococcal vaccine status: Up to date  Covid-19 vaccine status: Information provided on how to obtain vaccines.   Qualifies for Shingles Vaccine? Yes   Zostavax completed No   Shingrix Completed?: No.    Education has been provided regarding the importance of this vaccine. Patient has been advised to call insurance company to determine out of pocket expense if they have not yet received this vaccine. Advised may also receive vaccine at local pharmacy or Health Dept. Verbalized acceptance and understanding.  Screening Tests Health Maintenance  Topic Date Due   Zoster Vaccines- Shingrix (1 of 2) Never done   Colonoscopy  Never done   OPHTHALMOLOGY EXAM  12/09/2021   COVID-19 Vaccine (4 - 2024-25 season) 11/08/2022   DEXA SCAN  05/16/2024 (Originally 11/06/2016)   HEMOGLOBIN A1C  07/14/2023   INFLUENZA VACCINE  10/08/2023   Diabetic kidney evaluation - Urine ACR  01/12/2024   FOOT EXAM  01/12/2024   Diabetic kidney evaluation - eGFR measurement  04/15/2024   LIPID PANEL  04/15/2024   DTaP/Tdap/Td (3 - Td or Tdap) 05/06/2024   Medicare Annual Wellness (AWV)  07/05/2024   MAMMOGRAM  11/09/2024   Pneumonia Vaccine 78+ Years old  Completed   Hepatitis C Screening  Completed   HPV VACCINES  Aged Out   Meningococcal B Vaccine  Aged Out     Health Maintenance  Health Maintenance Due  Topic Date Due   Zoster Vaccines- Shingrix (1 of 2) Never done   Colonoscopy  Never done   OPHTHALMOLOGY EXAM  12/09/2021   COVID-19 Vaccine (4 - 2024-25 season) 11/08/2022    Colorectal cancer screening: No longer required. Pt  was ordered a cologuard kit.  Mammogram status: Completed 11/10/22. Repeat every year  Bone Density status: Ordered 07/06/23. Pt provided with contact info and advised to call to schedule appt.  Lung Cancer Screening: (Low Dose CT Chest recommended if Age 50-80 years, 20 pack-year currently smoking OR have quit w/in 15years.) does not qualify.   Lung Cancer Screening Referral: N/A  Additional Screening:  Hepatitis C Screening: does not qualify; Completed 01/27/2016  Vision Screening: Recommended annual ophthalmology exams for early detection of glaucoma and other disorders of the eye. Is the patient up to date with their annual eye exam?  No  Who is the provider or what is the name of the office in which the patient attends annual eye exams? Dr. Lydia Sams If pt is not established with a provider, would they like to be referred to a provider to establish care? No .  Dental Screening: Recommended annual dental exams for proper oral hygiene  Diabetic Foot Exam: Diabetic Foot Exam: Completed 01/12/2023  Community Resource Referral / Chronic Care Management: CRR required this visit?  No   CCM required this visit?  No     Plan:     I have personally reviewed and noted the following in the patient's chart:   Medical and social history Use of alcohol, tobacco or illicit drugs  Current medications and supplements including opioid prescriptions. Patient is not currently taking opioid prescriptions. Functional ability and status Nutritional status Physical activity Advanced directives List of other physicians Hospitalizations, surgeries, and ER visits in previous 12 months Vitals Screenings to include  cognitive, depression, and falls Referrals and appointments  In addition, I have reviewed and discussed with patient certain preventive protocols, quality metrics, and best practice recommendations. A written personalized care plan for preventive services as well as general preventive health recommendations were provided to patient.     Julian Obey, RMA   07/06/2023   After Visit Summary: (Mail) Due to this being a telephonic visit, the after visit summary with patients personalized plan was offered to patient via mail   Nurse Notes: Thank you for your time today.

## 2023-07-07 ENCOUNTER — Telehealth: Payer: Self-pay

## 2023-07-07 NOTE — Telephone Encounter (Signed)
 Pt was called and advise of medication ozempic  . Pt was given number to call pharmacy assistance. [phone].  Kh

## 2023-07-08 NOTE — Telephone Encounter (Signed)
 Just FYI.

## 2023-07-19 ENCOUNTER — Ambulatory Visit (INDEPENDENT_AMBULATORY_CARE_PROVIDER_SITE_OTHER): Payer: Self-pay | Admitting: Nurse Practitioner

## 2023-07-19 ENCOUNTER — Ambulatory Visit: Payer: Medicare HMO | Admitting: Podiatry

## 2023-07-19 ENCOUNTER — Encounter: Payer: Self-pay | Admitting: Nurse Practitioner

## 2023-07-19 VITALS — BP 147/61 | HR 73 | Temp 97.0°F | Wt 204.0 lb

## 2023-07-19 DIAGNOSIS — I10 Essential (primary) hypertension: Secondary | ICD-10-CM | POA: Diagnosis not present

## 2023-07-19 DIAGNOSIS — E1165 Type 2 diabetes mellitus with hyperglycemia: Secondary | ICD-10-CM

## 2023-07-19 DIAGNOSIS — Z1211 Encounter for screening for malignant neoplasm of colon: Secondary | ICD-10-CM | POA: Diagnosis not present

## 2023-07-19 DIAGNOSIS — N1832 Chronic kidney disease, stage 3b: Secondary | ICD-10-CM

## 2023-07-19 DIAGNOSIS — E785 Hyperlipidemia, unspecified: Secondary | ICD-10-CM

## 2023-07-19 LAB — POCT GLYCOSYLATED HEMOGLOBIN (HGB A1C): Hemoglobin A1C: 11 % — AB (ref 4.0–5.6)

## 2023-07-19 MED ORDER — BLOOD GLUCOSE TEST VI STRP: 1.0000 | ORAL_STRIP | Freq: Three times a day (TID) | 0 refills | Status: DC

## 2023-07-19 MED ORDER — LANCET DEVICE MISC: 1.0000 | Freq: Three times a day (TID) | 0 refills | Status: AC

## 2023-07-19 MED ORDER — BLOOD GLUCOSE MONITORING SUPPL DEVI: 1.0000 | Freq: Three times a day (TID) | 0 refills | Status: AC

## 2023-07-19 MED ORDER — VALSARTAN 80 MG PO TABS
80.0000 mg | ORAL_TABLET | Freq: Every day | ORAL | 1 refills | Status: DC
Start: 2023-07-19 — End: 2023-10-05

## 2023-07-19 MED ORDER — SEMAGLUTIDE (2 MG/DOSE) 8 MG/3ML ~~LOC~~ SOPN: 2.0000 mg | PEN_INJECTOR | SUBCUTANEOUS | 3 refills | Status: AC

## 2023-07-19 MED ORDER — LANCETS MISC. MISC
1.0000 | Freq: Three times a day (TID) | 0 refills | Status: AC
Start: 2023-07-19 — End: 2023-08-18

## 2023-07-19 NOTE — Assessment & Plan Note (Signed)
 Maintain hydration avoid use of NSAIDs and other nephrotoxic agents Patient to follow-up with nephrologist Lab Results  Component Value Date   NA 144 04/16/2023   K 4.7 04/16/2023   CO2 24 04/16/2023   GLUCOSE 166 (H) 04/16/2023   BUN 24 04/16/2023   CREATININE 1.75 (H) 04/16/2023   CALCIUM  9.5 04/16/2023   GFR 63.19 11/07/2014   EGFR 31 (L) 04/16/2023   GFRNONAA 36 (L) 04/07/2022

## 2023-07-19 NOTE — Assessment & Plan Note (Addendum)
 Diastolic blood pressure is normal but systolic blood pressure is high Patient encouraged to follow-up with nephrology Continue valsartan  80 mg daily, amlodipine  5 mg daily, furosemide  20 mg daily DASH diet and commitment to daily physical activity for a minimum of 30 minutes discussed and encouraged, as a part of hypertension management. The importance of attaining a healthy weight is also discussed.     07/19/2023   10:13 AM 07/19/2023    9:37 AM 07/19/2023    9:23 AM 07/06/2023    9:45 AM 05/17/2023   11:29 AM 05/17/2023   11:23 AM 05/17/2023   10:54 AM  BP/Weight  Systolic BP 147 153 154  139 147 143  Diastolic BP 61 59 59  60 65 61  Wt. (Lbs)   204 203   203  BMI   36.14 kg/m2 35.96 kg/m2   34.84 kg/m2

## 2023-07-19 NOTE — Progress Notes (Signed)
 Established Patient Office Visit  Subjective:  Patient ID: Rachel Gonzalez, female    DOB: 03/07/1951  Age: 73 y.o. MRN: [id number]  CC:  Chief Complaint  Patient presents with   Diabetes    HPI Rachel Gonzalez is a 73 y.o. female  has a past medical history of Anemia, Arthritis, CHF (congestive heart failure) (HCC), Chronic renal disease, stage 3, moderately decreased glomerular filtration rate (GFR) between 30-59 mL/min/1.73 square meter (HCC), Diabetes mellitus, GERD (gastroesophageal reflux disease), and Hypertension.  Patient presents for follow-up for her chronic medical conditions  Hypertension.  Currently on furosemide  20 mg daily, amlodipine  10 mg daily, valsartan  80 mg daily.  Currently denies chest pain, shortness of breath, edema  Uncontrolled type 2 diabetes.  Currently on Lantus  25 units daily but takes 20 units daily, she ran out of Ozempic  1 mg dose for some weeks but had just restarted the medication.  On atorvastatin  20 mg daily for hyperlipidemia.  Patient denies polyphagia polyuria polydipsia.  States that she is up-to-date with diabetic eye exam  Chronic kidney disease.  She is currently established with [facility] but has not been following up regularly due to taking care of her husband who has cancer and is undergoing treatment  Has Cologuard ordered but would like a referral for colonoscopy, referral placed Patient encouraged to get shingles vaccine at the pharmacy      Past Medical History:  Diagnosis Date   Anemia    Arthritis    right finger   CHF (congestive heart failure) (HCC)    Chronic renal disease, stage 3, moderately decreased glomerular filtration rate (GFR) between 30-59 mL/min/1.73 square meter (HCC)    Diabetes mellitus    GERD (gastroesophageal reflux disease)    Hypertension     Past Surgical History:  Procedure Laterality Date   AMPUTATION Right 05/08/2014   Procedure: AMPUTATION RAY, right great toe;  Surgeon: Timothy Ford, MD;  Location: [facility] OR;  Service: Orthopedics;  Laterality: Right;   arm surgery      CESAREAN SECTION     COLONOSCOPY     I & D EXTREMITY Right 05/08/2014   Procedure: IRRIGATION AND DEBRIDEMENT FOOT;  Surgeon: Timothy Ford, MD;  Location: [facility] OR;  Service: Orthopedics;  Laterality: Right;   MEMBRANE PEEL Right 03/15/2018   Procedure: MEMBRANE PEEL;  Surgeon: Jearline Minder, MD;  Location[facility] OR;  Service: Ophthalmology;  Laterality: Right;   PHOTOCOAGULATION WITH LASER Right 03/15/2018   Procedure: PHOTOCOAGULATION WITH LASER;  Surgeon: Jearline Minder, MD;  Location[facility] OR;  Service: Ophthalmology;  Laterality: Right;   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/2018   Procedure: RIGHT EYE COMPLEX RETINA DETACHMENT VITRECTOMY MEMBRANE PEEL WITH AIR,GAS,SILICONE OIL PHOTOCOAGULATION;  Surgeon: Jearline Minder, MD;  Location: [facility] OR;  Service: Ophthalmology;  Laterality: Right;    Family History  Problem Relation Age of Onset   Diabetes Mother    Breast cancer Neg Hx     Social History   Socioeconomic History   Marital status: Married    Spouse name: Not on file   Number of children: Not on file   Years of education: Not on file   Highest education level: Not on file  Occupational History   Not on file  Tobacco Use   Smoking status: Never   Smokeless tobacco: Never  Vaping Use   Vaping status: Never Used  Substance and Sexual Activity   Alcohol use: No   Drug  use: No   Sexual activity: Not on file  Other Topics Concern   Not on file  Social History Narrative   From [place] .   Married.   Three children, 9 grandchildren.   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   Travels to [place], [address]   Social Drivers of Health   Financial Resource Strain: Low Risk  (07/06/2023)   Overall Financial Resource Strain (CARDIA)    Difficulty of Paying Living Expenses: Not hard at all  Food Insecurity: No Food Insecurity  (07/06/2023)   Hunger Vital Sign    Worried About Running Out of Food in the Last Year: Never true    Ran Out of Food in the Last Year: Never true  Transportation Needs: No Transportation Needs (07/06/2023)   PRAPARE - Administrator, Civil Service (Medical): No    Lack of Transportation (Non-Medical): No  Physical Activity: Insufficiently Active (07/06/2023)   Exercise Vital Sign    Days of Exercise per Week: 2 days    Minutes of Exercise per Session: 10 min  Stress: No Stress Concern Present (07/06/2023)   [employer] of Occupational Health - Occupational Stress Questionnaire    Feeling of Stress : Not at all  Social Connections: Socially Integrated (07/06/2023)   Social Connection and Isolation Panel [NHANES]    Frequency of Communication with Friends and Family: More than three times a week    Frequency of Social Gatherings with Friends and Family: More than three times a week    Attends Religious Services: More than 4 times per year    Active Member of [employer] or Organizations: No    Attends Engineer, structural: More than 4 times per year    Marital Status: Married  Catering manager Violence: Not At Risk (07/06/2023)   Humiliation, Afraid, Rape, and Kick questionnaire    Fear of Current or Ex-Partner: No    Emotionally Abused: No    Physically Abused: No    Sexually Abused: No    Outpatient Medications Prior to Visit  Medication Sig Dispense Refill   acetaminophen  (TYLENOL ) 325 MG tablet Take 2 tablets (650 mg total) by mouth every 6 (six) hours as needed. 36 tablet 0   amLODipine  (NORVASC ) 10 MG tablet TAKE 1 TABLET EVERY DAY 90 tablet 3   atorvastatin  (LIPITOR) 20 MG tablet TAKE 1 TABLET EVERY DAY 90 tablet 3   brimonidine (ALPHAGAN) 0.2 % ophthalmic solution Place 1 drop into the right eye 2 (two) times daily.     furosemide  (LASIX ) 20 MG tablet TAKE 1 TABLET EVERY DAY 90 tablet 3   gabapentin  (NEURONTIN ) 300 MG capsule Take 1 capsule (300 mg total) by  mouth 2 (two) times daily. 180 capsule 1   insulin  glargine (LANTUS  SOLOSTAR) 100 UNIT/ML Solostar Pen Inject 25 Units into the skin daily. 45 mL 1   nystatin  (MYCOSTATIN /NYSTOP ) powder APPLY POWDER TOPICALLY TO AFFECTED AREA 4 TIMES DAILY 15 g 0   omeprazole  (PRILOSEC) 20 MG capsule Take 1 capsule by mouth once daily 90 capsule 0   RELION PEN NEEDLE 31G/8MM 31G X 8 MM MISC USE AS DIRECTED 4 TIMES DAILY 100 each 11   timolol (TIMOPTIC) 0.5 % ophthalmic solution Place 1 drop into the right eye 2 (two) times daily.     triamcinolone  ointment (KENALOG ) 0.5 % Apply 1 Application topically 2 (two) times daily. 30 g 0   Semaglutide , 1 MG/DOSE, 4 MG/3ML SOPN Inject  1 mg as directed once a week. 3 mL 0   valsartan  (DIOVAN ) 80 MG tablet Take 1 tablet (80 mg total) by mouth daily. 90 tablet 1   EQ ALLERGY RELIEF, CETIRIZINE , 10 MG tablet Take 1 tablet by mouth once daily (Patient not taking: Reported on 07/19/2023) 90 tablet 0   No facility-administered medications prior to visit.    Allergies  Allergen Reactions   Tramadol Nausea And Vomiting   Adhesive [Tape] Itching   Bactrim  [Sulfamethoxazole -Trimethoprim ] Other (See Comments)    AKI and hyperkalemia    Tylenol  With Codeine  #3 [Acetaminophen -Codeine ] Diarrhea and Itching    Only allergic to Codeine , patient reports no allergy to Tylenol .    ROS Review of Systems  Constitutional:  Negative for appetite change, chills, fatigue and fever.  HENT:  Negative for congestion, postnasal drip, rhinorrhea and sneezing.   Respiratory:  Negative for cough, shortness of breath and wheezing.   Cardiovascular:  Negative for chest pain, palpitations and leg swelling.  Gastrointestinal:  Negative for abdominal pain, constipation, nausea and vomiting.  Genitourinary:  Negative for difficulty urinating, dysuria, flank pain and frequency.  Musculoskeletal:  Negative for arthralgias, back pain, joint swelling and myalgias.  Skin:  Negative for color change,  pallor, rash and wound.  Neurological:  Negative for dizziness, facial asymmetry, weakness, numbness and headaches.  Psychiatric/Behavioral:  Negative for behavioral problems, confusion, self-injury and suicidal ideas.       Objective:     Physical Exam Vitals and nursing note reviewed.  Constitutional:      General: She is not in acute distress.    Appearance: Normal appearance. She is obese. She is not ill-appearing, toxic-appearing or diaphoretic.  HENT:     Mouth/Throat:     Mouth: Mucous membranes are moist.     Pharynx: Oropharynx is clear. No oropharyngeal exudate or posterior oropharyngeal erythema.  Eyes:     General: No scleral icterus.       Right eye: No discharge.        Left eye: No discharge.     Extraocular Movements: Extraocular movements intact.     Conjunctiva/sclera: Conjunctivae normal.  Cardiovascular:     Rate and Rhythm: Normal rate and regular rhythm.     Pulses: Normal pulses.     Heart sounds: Normal heart sounds. No murmur heard.    No friction rub. No gallop.  Pulmonary:     Effort: Pulmonary effort is normal. No respiratory distress.     Breath sounds: Normal breath sounds. No stridor. No wheezing, rhonchi or rales.  Chest:     Chest wall: No tenderness.  Abdominal:     General: There is no distension.     Palpations: Abdomen is soft.     Tenderness: There is no abdominal tenderness. There is no right CVA tenderness, left CVA tenderness or guarding.  Musculoskeletal:        General: No swelling, tenderness, deformity or signs of injury.     Right lower leg: No edema.     Left lower leg: No edema.  Skin:    General: Skin is warm and dry.     Capillary Refill: Capillary refill takes less than 2 seconds.     Coloration: Skin is not jaundiced or pale.     Findings: No bruising, erythema or lesion.  Neurological:     Mental Status: She is alert and oriented to person, place, and time.     Motor: No weakness.     Coordination: Coordination  normal.     Gait: Gait normal.  Psychiatric:        Mood and Affect: Mood normal.        Behavior: Behavior normal.        Thought Content: Thought content normal.        Judgment: Judgment normal.     BP (!) 147/61   Pulse 73   Temp (!) 97 F (36.1 C)   Wt 204 lb (92.5 kg)   SpO2 (!) 72%   BMI 36.14 kg/m  Wt Readings from Last 3 Encounters:  07/19/23 204 lb (92.5 kg)  07/06/23 203 lb (92.1 kg)  05/17/23 203 lb (92.1 kg)    Lab Results  Component Value Date   TSH 0.838 04/10/2020   Lab Results  Component Value Date   WBC 5.2 12/15/2022   HGB 10.4 (L) 12/15/2022   HCT 34.3 12/15/2022   MCV 89 12/15/2022   PLT 225 12/15/2022   Lab Results  Component Value Date   NA 144 04/16/2023   K 4.7 04/16/2023   CO2 24 04/16/2023   GLUCOSE 166 (H) 04/16/2023   BUN 24 04/16/2023   CREATININE 1.75 (H) 04/16/2023   BILITOT 0.2 04/16/2023   ALKPHOS 184 (H) 04/16/2023   AST 14 04/16/2023   ALT 8 04/16/2023   PROT 6.6 04/16/2023   ALBUMIN 4.1 04/16/2023   CALCIUM  9.5 04/16/2023   ANIONGAP 8 04/07/2022   EGFR 31 (L) 04/16/2023   GFR 63.19 11/07/2014   Lab Results  Component Value Date   CHOL 156 06/26/2019   Lab Results  Component Value Date   HDL 78 06/26/2019   Lab Results  Component Value Date   LDLCALC 68 06/26/2019   Lab Results  Component Value Date   TRIG 43 06/26/2019   Lab Results  Component Value Date   CHOLHDL 2.0 06/26/2019   Lab Results  Component Value Date   HGBA1C 11.0 (A) 07/19/2023      Assessment & Plan:   Problem List Items Addressed This Visit       Cardiovascular and Mediastinum   Essential hypertension   Diastolic blood pressure is normal but systolic blood pressure is high Patient encouraged to follow-up with nephrology Continue valsartan  80 mg daily, amlodipine  5 mg daily, furosemide  20 mg daily DASH diet and commitment to daily physical activity for a minimum of 30 minutes discussed and encouraged, as a part of  hypertension management. The importance of attaining a healthy weight is also discussed.     07/19/2023   10:13 AM 07/19/2023    9:37 AM 07/19/2023    9:23 AM 07/06/2023    9:45 AM 05/17/2023   11:29 AM 05/17/2023   11:23 AM 05/17/2023   10:54 AM  BP/Weight  Systolic BP 147 153 154  139 147 143  Diastolic BP 61 59 59  60 65 61  Wt. (Lbs)   204 203   203  BMI   36.14 kg/m2 35.96 kg/m2   34.84 kg/m2           Relevant Medications   valsartan  (DIOVAN ) 80 MG tablet   Other Relevant Orders   Basic Metabolic Panel     Endocrine   Uncontrolled type 2 diabetes mellitus with hyperglycemia (HCC) - Primary   Lab Results  Component Value Date   HGBA1C 11.0 (A) 07/19/2023  Continue Ozempic  1 mg once weekly, after completion increase to Ozempic  2 mg once weekly. Take Lantus  25 units daily CBG goals discussed, patient  encouraged to report blood sugar readings consistently less than 70, treatment for hypoglycemia discussed Encouraged to keep upcoming appointment with the clinical pharmacist Up-to-date with diabetic eye exam will request records Follow-up in 3 months      Relevant Medications   Semaglutide , 2 MG/DOSE, 8 MG/3ML SOPN   valsartan  (DIOVAN ) 80 MG tablet   Other Relevant Orders   POCT glycosylated hemoglobin (Hb A1C) (Completed)   Basic Metabolic Panel     Genitourinary   Stage 3 chronic kidney disease (HCC)   Maintain hydration avoid use of NSAIDs and other nephrotoxic agents Patient to follow-up with nephrologist Lab Results  Component Value Date   NA 144 04/16/2023   K 4.7 04/16/2023   CO2 24 04/16/2023   GLUCOSE 166 (H) 04/16/2023   BUN 24 04/16/2023   CREATININE 1.75 (H) 04/16/2023   CALCIUM  9.5 04/16/2023   GFR 63.19 11/07/2014   EGFR 31 (L) 04/16/2023   GFRNONAA 36 (L) 04/07/2022         Relevant Medications   Semaglutide , 2 MG/DOSE, 8 MG/3ML SOPN   Other Relevant Orders   Basic Metabolic Panel     Other   Dyslipidemia, goal LDL below 70    Well-controlled Continue atorvastatin  20 mg daily Lab Results  Component Value Date   CHOL 156 06/26/2019   HDL 78 06/26/2019   LDLCALC 68 06/26/2019   LDLDIRECT 33 04/16/2023   TRIG 43 06/26/2019   CHOLHDL 2.0 06/26/2019         Relevant Medications   valsartan  (DIOVAN ) 80 MG tablet   Other Visit Diagnoses       Colon cancer screening       Relevant Orders   Ambulatory referral to Gastroenterology       Meds ordered this encounter  Medications   Semaglutide , 2 MG/DOSE, 8 MG/3ML SOPN    Sig: Inject 2 mg as directed once a week.    Dispense:  9 mL    Refill:  3   Blood Glucose Monitoring Suppl DEVI    Sig: 1 each by Does not apply route in the morning, at noon, and at bedtime. May substitute to any manufacturer covered by patient's insurance.    Dispense:  1 each    Refill:  0   Glucose Blood (BLOOD GLUCOSE TEST STRIPS) STRP    Sig: 1 each by In Vitro route in the morning, at noon, and at bedtime. May substitute to any manufacturer covered by patient's insurance.    Dispense:  100 strip    Refill:  0   Lancet Device MISC    Sig: 1 each by Does not apply route in the morning, at noon, and at bedtime. May substitute to any manufacturer covered by patient's insurance.    Dispense:  1 each    Refill:  0   Lancets Misc. MISC    Sig: 1 each by Does not apply route in the morning, at noon, and at bedtime. May substitute to any manufacturer covered by patient's insurance.    Dispense:  100 each    Refill:  0   valsartan  (DIOVAN ) 80 MG tablet    Sig: Take 1 tablet (80 mg total) by mouth daily.    Dispense:  90 tablet    Refill:  1    Follow-up: Return in about 3 months (around 10/19/2023) for HTN, DM, CPE.    Fatuma Dowers R Zahria Ding, FNP

## 2023-07-19 NOTE — Patient Instructions (Addendum)
 Goal for fasting blood sugar ranges from 80 to 120 and 2 hours after any meal or at bedtime should be between 130 to 170.   Please take lantus  25 units daily , start ozempic  2mg  once weekly injection after you run out of the ozempic  1mg  that you have at home  Please let me know if your blood sugar consistently run below 70.   It is very important that you follow up with the kidney doctor as dicussed   Our pharmacist will be calling you on the phone to check on you as discussed   It is important that you exercise regularly at least 30 minutes 5 times a week as tolerated  Think about what you will eat, plan ahead. Choose " clean, green, fresh or frozen" over canned, processed or packaged foods which are more sugary, salty and fatty. 70 to 75% of food eaten should be vegetables and fruit. Three meals at set times with snacks allowed between meals, but they must be fruit or vegetables. Aim to eat over a 12 hour period , example 7 am to 7 pm, and STOP after  your last meal of the day. Drink water ,generally about 64 ounces per day, no other drink is as healthy. Fruit juice is best enjoyed in a healthy way, by EATING the fruit.  Thanks for choosing [facility] we consider it a privelige to serve you.

## 2023-07-19 NOTE — Assessment & Plan Note (Signed)
 Well-controlled Continue atorvastatin  20 mg daily Lab Results  Component Value Date   CHOL 156 06/26/2019   HDL 78 06/26/2019   LDLCALC 68 06/26/2019   LDLDIRECT 33 04/16/2023   TRIG 43 06/26/2019   CHOLHDL 2.0 06/26/2019

## 2023-07-19 NOTE — Assessment & Plan Note (Signed)
 Lab Results  Component Value Date   HGBA1C 11.0 (A) 07/19/2023  Continue Ozempic  1 mg once weekly, after completion increase to Ozempic  2 mg once weekly. Take Lantus  25 units daily CBG goals discussed, patient encouraged to report blood sugar readings consistently less than 70, treatment for hypoglycemia discussed Encouraged to keep upcoming appointment with the clinical pharmacist Up-to-date with diabetic eye exam will request records Follow-up in 3 months

## 2023-07-20 ENCOUNTER — Ambulatory Visit: Payer: Self-pay | Admitting: Nurse Practitioner

## 2023-07-20 ENCOUNTER — Other Ambulatory Visit: Payer: Self-pay | Admitting: Nurse Practitioner

## 2023-07-20 DIAGNOSIS — E875 Hyperkalemia: Secondary | ICD-10-CM

## 2023-07-20 DIAGNOSIS — N1832 Chronic kidney disease, stage 3b: Secondary | ICD-10-CM

## 2023-07-20 DIAGNOSIS — K219 Gastro-esophageal reflux disease without esophagitis: Secondary | ICD-10-CM

## 2023-07-20 LAB — BASIC METABOLIC PANEL WITH GFR
BUN/Creatinine Ratio: 16 (ref 12–28)
BUN: 34 mg/dL — ABNORMAL HIGH (ref 8–27)
CO2: 22 mmol/L (ref 20–29)
Calcium: 9.4 mg/dL (ref 8.7–10.3)
Chloride: 106 mmol/L (ref 96–106)
Creatinine, Ser: 2.07 mg/dL — ABNORMAL HIGH (ref 0.57–1.00)
Glucose: 153 mg/dL — ABNORMAL HIGH (ref 70–99)
Potassium: 5.3 mmol/L — ABNORMAL HIGH (ref 3.5–5.2)
Sodium: 142 mmol/L (ref 134–144)
eGFR: 25 mL/min/{1.73_m2} — ABNORMAL LOW (ref >59)

## 2023-07-26 ENCOUNTER — Telehealth: Payer: Self-pay | Admitting: Nurse Practitioner

## 2023-07-26 ENCOUNTER — Other Ambulatory Visit

## 2023-07-26 DIAGNOSIS — E875 Hyperkalemia: Secondary | ICD-10-CM | POA: Diagnosis not present

## 2023-07-26 DIAGNOSIS — N1832 Chronic kidney disease, stage 3b: Secondary | ICD-10-CM

## 2023-07-26 NOTE — Telephone Encounter (Signed)
 Patient was identified as falling into the [place] Measure - Diabetes.   Patient was: Appointment already scheduled for:  07/19/23.

## 2023-07-27 ENCOUNTER — Ambulatory Visit: Payer: Self-pay | Admitting: Nurse Practitioner

## 2023-07-27 DIAGNOSIS — E875 Hyperkalemia: Secondary | ICD-10-CM

## 2023-07-27 LAB — BASIC METABOLIC PANEL WITH GFR
BUN/Creatinine Ratio: 19 (ref 12–28)
BUN: 37 mg/dL — ABNORMAL HIGH (ref 8–27)
CO2: 22 mmol/L (ref 20–29)
Calcium: 9.1 mg/dL (ref 8.7–10.3)
Chloride: 105 mmol/L (ref 96–106)
Creatinine, Ser: 1.9 mg/dL — ABNORMAL HIGH (ref 0.57–1.00)
Glucose: 239 mg/dL — ABNORMAL HIGH (ref 70–99)
Potassium: 5.4 mmol/L — ABNORMAL HIGH (ref 3.5–5.2)
Sodium: 140 mmol/L (ref 134–144)
eGFR: 28 mL/min/{1.73_m2} — ABNORMAL LOW (ref >59)

## 2023-07-27 MED ORDER — LOKELMA 10 G PO PACK: 10.0000 g | PACK | Freq: Once | ORAL | 0 refills | Status: AC

## 2023-08-03 ENCOUNTER — Telehealth: Payer: Self-pay

## 2023-08-03 ENCOUNTER — Other Ambulatory Visit: Payer: Self-pay

## 2023-08-03 DIAGNOSIS — I10 Essential (primary) hypertension: Secondary | ICD-10-CM

## 2023-08-03 DIAGNOSIS — E1165 Type 2 diabetes mellitus with hyperglycemia: Secondary | ICD-10-CM

## 2023-08-03 NOTE — Progress Notes (Signed)
   08/03/2023  Patient ID: Rachel Gonzalez, female   DOB: 1950-03-15, 73 y.o.   MRN: [id number]  Attempted to contact patient for medication management/review. Left HIPAA compliant message for patient to return my call at their convenience.   Second attempt for patient outreach. Will follow up  with an appointment rescheduled. Reviewed adherence (compliance concerns of valsartan , Ozempic  d/t to cost), BMP, LDL (last complete lipid panel on 04/07/2022), CrCrl (adjusted BW). May consider assessment for medication dose adjustment for possibly gabapentin  and hydroxyzine  with updated Scr as well as medication assistance.   Thank you for allowing pharmacy to be a part of this patient's care.  Alexandria Angel, PharmD Clinical Pharmacist Cell: [phone]

## 2023-08-04 ENCOUNTER — Other Ambulatory Visit: Payer: Self-pay | Admitting: Nurse Practitioner

## 2023-08-04 DIAGNOSIS — L304 Erythema intertrigo: Secondary | ICD-10-CM

## 2023-08-04 MED ORDER — NYSTATIN 100000 UNIT/GM EX OINT: 1.0000 | TOPICAL_OINTMENT | Freq: Two times a day (BID) | CUTANEOUS | 0 refills | Status: DC

## 2023-08-04 NOTE — Telephone Encounter (Signed)
 Please advise [facility]

## 2023-08-06 ENCOUNTER — Telehealth: Payer: Self-pay | Admitting: Nurse Practitioner

## 2023-08-06 NOTE — Telephone Encounter (Signed)
 Patient was identified as falling into the [place] Measure - Diabetes.   Patient was: Referred to pharmacy for chronic disease management.

## 2023-08-16 DIAGNOSIS — E113591 Type 2 diabetes mellitus with proliferative diabetic retinopathy without macular edema, right eye: Secondary | ICD-10-CM | POA: Diagnosis not present

## 2023-08-16 DIAGNOSIS — H401121 Primary open-angle glaucoma, left eye, mild stage: Secondary | ICD-10-CM | POA: Diagnosis not present

## 2023-08-16 DIAGNOSIS — E113522 Type 2 diabetes mellitus with proliferative diabetic retinopathy with traction retinal detachment involving the macula, left eye: Secondary | ICD-10-CM | POA: Diagnosis not present

## 2023-08-16 DIAGNOSIS — H35031 Hypertensive retinopathy, right eye: Secondary | ICD-10-CM | POA: Diagnosis not present

## 2023-08-16 DIAGNOSIS — H3582 Retinal ischemia: Secondary | ICD-10-CM | POA: Diagnosis not present

## 2023-08-16 DIAGNOSIS — H59813 Chorioretinal scars after surgery for detachment, bilateral: Secondary | ICD-10-CM | POA: Diagnosis not present

## 2023-08-16 DIAGNOSIS — H21541 Posterior synechiae (iris), right eye: Secondary | ICD-10-CM | POA: Diagnosis not present

## 2023-08-16 DIAGNOSIS — H401113 Primary open-angle glaucoma, right eye, severe stage: Secondary | ICD-10-CM | POA: Diagnosis not present

## 2023-08-17 ENCOUNTER — Telehealth: Payer: Self-pay | Admitting: *Deleted

## 2023-08-17 NOTE — Progress Notes (Unsigned)
 Complex Care Management Care Guide Note  08/17/2023 Name: [name] MRN: [id number] DOB: Nov 17, 1950  Rachel Gonzalez is a 73 y.o. year old female who is a primary care patient of [name], [name], FNP and is actively engaged with the care management team. I reached out to Mauri Sous by phone today to assist with scheduling  with the Pharmacist.  Follow up plan: Unsuccessful telephone outreach attempt made. A HIPAA compliant phone message was left for the patient providing contact information and requesting a return call.  Barnie Bora  [facility]  [facility] Guide  Direct Dial: [phone]  Fax [phone]

## 2023-08-19 NOTE — Progress Notes (Signed)
 Complex Care Management Care Guide Note  08/19/2023 Name: Rachel Gonzalez MRN: [id number] DOB: 01-05-1951  Rachel Gonzalez is a 73 y.o. year old female who is a primary care patient of [name], [name], FNP and is actively engaged with the care management team. I reached out to Mauri Sous by phone today to assist with scheduling  with the Pharmacist.  Follow up plan: Face to Face appointment with complex care management team member scheduled for:  09/29/23 at 10:30 PM  [facility]  [facility], [facility] Guide  Direct Dial: [phone]  Fax [phone]

## 2023-08-24 ENCOUNTER — Ambulatory Visit: Admitting: Podiatry

## 2023-08-30 ENCOUNTER — Encounter: Payer: Self-pay | Admitting: Podiatry

## 2023-08-30 ENCOUNTER — Ambulatory Visit: Admitting: Podiatry

## 2023-08-30 DIAGNOSIS — E1142 Type 2 diabetes mellitus with diabetic polyneuropathy: Secondary | ICD-10-CM | POA: Diagnosis not present

## 2023-08-30 DIAGNOSIS — M79674 Pain in right toe(s): Secondary | ICD-10-CM | POA: Diagnosis not present

## 2023-08-30 DIAGNOSIS — M79675 Pain in left toe(s): Secondary | ICD-10-CM | POA: Diagnosis not present

## 2023-08-30 DIAGNOSIS — B351 Tinea unguium: Secondary | ICD-10-CM | POA: Diagnosis not present

## 2023-08-30 NOTE — Progress Notes (Unsigned)
 This patient presents to the office with chief complaint of long thick painful nails.  Patient says the nails are painful walking and wearing shoes.  This patient is unable to self treat.  This patient is unable to trim her nails since she is unable to reach her nails. She has amputation big toe right foot.  She presents to the office for preventative foot care services.  General Appearance  Alert, conversant and in no acute stress.  Vascular  Dorsalis pedis and posterior tibial  pulses are palpable  bilaterally.  Capillary return is within normal limits  bilaterally. Temperature is within normal limits  bilaterally.  Neurologic  Senn-Weinstein monofilament wire test within normal limits  bilaterally. Muscle power within normal limits bilaterally.  Nails Thick disfigured discolored nails with subungual debris  from hallux to fifth toes left foot and 2-5 right foot. No evidence of bacterial infection or drainage bilaterally.  Orthopedic  No limitations of motion  feet .  No crepitus or effusions noted.  No bony pathology or digital deformities noted. 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  Skin  normotropic skin with no porokeratosis noted bilaterally.  No signs of infections or ulcers noted.     Onychomycosis  Nails .  Pain in right toes  Pain in left toes  Capsulitis 5th MPJ left foot.  Debridement of nails both feet followed trimming the nails with dremel tool.   Tocheck on her diabetic shoes tomorrow. RTC 3 months.   Cordella Bold DPM

## 2023-09-01 NOTE — Progress Notes (Signed)
 Patient was here to see Dr. Loreda 6/23 tried on shoes and fit is good  Ppw from treating expired end of May refaxed today and called patient we will let her know when she can pick up shoes and will need patient to sign delivery ppw  Lolita

## 2023-09-09 DIAGNOSIS — E113593 Type 2 diabetes mellitus with proliferative diabetic retinopathy without macular edema, bilateral: Secondary | ICD-10-CM | POA: Diagnosis not present

## 2023-09-09 DIAGNOSIS — Z961 Presence of intraocular lens: Secondary | ICD-10-CM | POA: Diagnosis not present

## 2023-09-09 DIAGNOSIS — H401113 Primary open-angle glaucoma, right eye, severe stage: Secondary | ICD-10-CM | POA: Diagnosis not present

## 2023-09-09 DIAGNOSIS — H31013 Macula scars of posterior pole (postinflammatory) (post-traumatic), bilateral: Secondary | ICD-10-CM | POA: Diagnosis not present

## 2023-09-09 DIAGNOSIS — H401121 Primary open-angle glaucoma, left eye, mild stage: Secondary | ICD-10-CM | POA: Diagnosis not present

## 2023-09-09 DIAGNOSIS — H35343 Macular cyst, hole, or pseudohole, bilateral: Secondary | ICD-10-CM | POA: Diagnosis not present

## 2023-09-17 ENCOUNTER — Encounter: Payer: Self-pay | Admitting: Nurse Practitioner

## 2023-09-21 ENCOUNTER — Telehealth: Payer: Self-pay

## 2023-09-21 NOTE — Telephone Encounter (Signed)
 Copied from CRM [id number]. Topic: General - Other >> Sep 21, 2023  4:12 PM Marissa P wrote: Reason for CRM: Patient is requesting a callback from the doctors nurse regarding medication please.

## 2023-09-22 ENCOUNTER — Other Ambulatory Visit: Payer: Self-pay | Admitting: Nurse Practitioner

## 2023-09-23 ENCOUNTER — Other Ambulatory Visit: Payer: Self-pay | Admitting: Nurse Practitioner

## 2023-09-23 DIAGNOSIS — H401113 Primary open-angle glaucoma, right eye, severe stage: Secondary | ICD-10-CM | POA: Diagnosis not present

## 2023-09-23 DIAGNOSIS — Z961 Presence of intraocular lens: Secondary | ICD-10-CM | POA: Diagnosis not present

## 2023-09-23 DIAGNOSIS — H401121 Primary open-angle glaucoma, left eye, mild stage: Secondary | ICD-10-CM | POA: Diagnosis not present

## 2023-09-29 ENCOUNTER — Other Ambulatory Visit (HOSPITAL_COMMUNITY): Payer: Self-pay

## 2023-09-29 ENCOUNTER — Other Ambulatory Visit (INDEPENDENT_AMBULATORY_CARE_PROVIDER_SITE_OTHER): Payer: Self-pay

## 2023-09-29 DIAGNOSIS — N1831 Chronic kidney disease, stage 3a: Secondary | ICD-10-CM

## 2023-09-29 NOTE — Telephone Encounter (Signed)
 Pt stated disregard. KH

## 2023-09-29 NOTE — Progress Notes (Signed)
 09/29/2023 Name: Rachel Gonzalez MRN: [id number] DOB: [birth year]  Chief Complaint  Patient presents with   Diabetes   Hyperlipidemia   Hypertension    Rachel Gonzalez is a 73 y.o. year old female who presented for a telephone visit.   They were referred to the pharmacist by their PCP for assistance in managing diabetes. PMH includes HTN, T2DM with polyneuropathy, CKD3 (eGFR 28 mL/min), BMI > 30, HLD, frequent falls.   Subjective: Patient was last seen by PCP, Lorice Shall, NP, on 07/19/23. At last visit, BP was elevated to 147/61 mmHg, HR 73. She reported that she had recently ran out of Ozempic  for several weeks but has been able to restart the medication. She was instructed to increase to Ozempic  2 mg at next fill. She reported that she is currently taking car of her husband who has cancer and is undergoing treatment. Patient has had elevated potassium 5.3 mEq/L and 5.4 mEq/L on last two BMP, in the setting of eGFR < 30 mL/min. She was instructed to take Lokelma  after hyperkalemia did not resolve. She did not return for repeat labs the following week.    Today, patient reports that she was able to increase Ozempic  to 2 mg weekly. Denies GI AE with increase. She reports that she did take the one-time dose of Lokelma  as instructed.    Care Team: Primary Care Provider: [name], [name], FNP ; Next Scheduled Visit: 10/19/23 Nephrology: Needs to be scheduled  Medication Access/Adherence  Current Pharmacy:  [facility] [address] [place], [name] - [address] [name] [address] Phone: [phone] Fax: [phone]  [facility] - [place], [place] - [name] [place] [address] Phone: [phone] Fax: [phone]   Patient reports affordability concerns with their medications: No  Patient reports access/transportation concerns to their pharmacy: No  Patient reports adherence  concerns with their medications:  Yes  - Was out of insulin  for 3 days last week (had to request refills and mail order), but issue has been resolved.   Freestyle Libre 3+: $29.13 per 30ds - patient reports this was too costly for her   Diabetes:  Current medications: Ozempic  2 mg weekly (Fridays), Lantus  25 units daily  Using Truemetrix (?) meter Current glucose readings: checking once daily in the morning (FBG) > 134, 154, 139 > then the numbers started repeating. She was unable to tell me the dates and times associated with these numbers. Reports one episode of BG as high as 300 mg/dL after drinking pineapple juice. Checked BG during appointment (~1.5 hrs after eating breakfast which consisted of cheese toast, glass of milk, unsweetened applesauce): 89 mg/dL  Reports that QO6+ did not last long enough, kept falling off early. Also had issues affording it.   Patient reports hypoglycemic s/sx including dizziness, shakiness, sweating - 1-2x when she was waking up at 8:30-9AM (69 mg/dL, 70 mg/dL) about 2 weeks ago. Patient denies hyperglycemic symptoms including polyuria, polydipsia, polyphagia, nocturia, neuropathy, blurred vision.  Current meal patterns: Eats 3 meals per day + 1 snack. Endorses having protein with each meal.  - Drinks: Reports having coke out to eat last Sunday (but infrequently), water  throughout the day  Current physical activity: no purposeful activity  Hypertension:  Current medications: valsartan  80 mg daily, amlodipine  10 mg daily, furosemide  20 mg daily Medications previously tried:   Patient does not have a validated, automated, upper arm home BP cuff Current  blood pressure readings readings: N/A  Patient denies hypotensive s/sx including dizziness, lightheadedness.  Patient denies hypertensive symptoms including headache, chest pain, shortness of breath  Patient denies LEE.  Hyperlipidemia/ASCVD Risk Reduction  Current lipid lowering medications:  atorvastatin  20 mg daily   Clinical ASCVD: No  The ASCVD Risk score (Arnett DK, et al., 2019) failed to calculate for the following reasons:   Cannot find a previous HDL lab   Cannot find a previous total cholesterol lab    Objective:  BP Readings from Last 3 Encounters:  07/19/23 (!) 147/61  05/17/23 139/60  04/16/23 (!) 163/59    Lab Results  Component Value Date   HGBA1C 11.0 (A) 07/19/2023   HGBA1C 8.2 (A) 04/16/2023   HGBA1C 10.4 (A) 01/12/2023       Latest Ref Rng & Units 07/26/2023    2:55 PM 07/19/2023   10:27 AM 04/16/2023   12:03 PM  BMP  Glucose 70 - 99 mg/dL 760  846  833   BUN 8 - 27 mg/dL 37  34  24   Creatinine 0.57 - 1.00 mg/dL 8.09  7.92  8.24   BUN/Creat Ratio 12 - 28 19  16  14    Sodium 134 - 144 mmol/L 140  142  144   Potassium 3.5 - 5.2 mmol/L 5.4  5.3  4.7   Chloride 96 - 106 mmol/L 105  106  103   CO2 20 - 29 mmol/L 22  22  24    Calcium  8.7 - 10.3 mg/dL 9.1  9.4  9.5     Lab Results  Component Value Date   CHOL 156 06/26/2019   HDL 78 06/26/2019   LDLCALC 68 06/26/2019   LDLDIRECT 33 04/16/2023   TRIG 43 06/26/2019   CHOLHDL 2.0 06/26/2019    Medications Reviewed Today     Reviewed by Brinda Lorain SQUIBB, RPH (Pharmacist) on 09/29/23 at 1551  Med List Status: <None>   Medication Order Taking? Sig Documenting Provider Last Dose Status Informant  acetaminophen  (TYLENOL ) 325 MG tablet [id number]  Take 2 tablets (650 mg total) by mouth every 6 (six) hours as needed. Elnor Jayson LABOR, DO  Active            Med Note SAMULE, [facility]   Tue Apr 07, 2022 10:10 AM) Prn   amLODipine  (NORVASC ) 10 MG tablet [id number] Yes TAKE 1 TABLET EVERY DAY Nichols, Tonya S, NP  Active   atorvastatin  (LIPITOR) 20 MG tablet [id number] Yes TAKE 1 TABLET EVERY DAY Nichols, Tonya S, NP  Active   Blood Glucose Monitoring Suppl DEVI [id number]  1 each by Does not apply route in the morning, at noon, and at bedtime. May substitute to any manufacturer covered by patient's  insurance. [name], [name], FNP  Active   brimonidine (ALPHAGAN) 0.2 % ophthalmic solution [id number]  Place 1 drop into the right eye 2 (two) times daily. [provider]  Active   EQ ALLERGY RELIEF, CETIRIZINE , 10 MG tablet [id number]  Take 1 tablet by mouth once daily Hollis, Lachina M, FNP  Active   furosemide  (LASIX ) 20 MG tablet [id number] Yes TAKE 1 TABLET EVERY DAY Nichols, Tonya S, NP  Active   gabapentin  (NEURONTIN ) 300 MG capsule [id number] Yes Take 1 capsule (300 mg total) by mouth 2 (two) times daily. [name], [name], FNP  Active   Glucose Blood (BLOOD GLUCOSE TEST STRIPS) STRP [id number]  USE 1 IN THE MORNING, AT NOON, AND AT BEDTIME. [name], [name], FNP  Active   insulin  glargine (LANTUS  SOLOSTAR) 100 UNIT/ML Solostar Pen [id number] Yes Inject 25 Units into the skin daily. Tilford Bertram HERO, FNP  Active   nystatin  (MYCOSTATIN /NYSTOP ) powder [id number]  APPLY POWDER TOPICALLY TO AFFECTED AREA 4 TIMES DAILY [name], [name], FNP  Active   nystatin  ointment (MYCOSTATIN ) [id number]  Apply 1 Application topically 2 (two) times daily. [name], [name], FNP  Active   omeprazole  (PRILOSEC) 20 MG capsule [id number] Yes Take 1 capsule by mouth once daily [name], [name], FNP  Active   RELION PEN NEEDLE 31G/8MM 31G X 8 MM MISC [id number]  USE AS DIRECTED 4 TIMES DAILY Vicenta Maduro, FNP  Active Self  Semaglutide , 2 MG/DOSE, 8 MG/3ML SOPN [id number] Yes Inject 2 mg as directed once a week. [name], [name], FNP  Active   timolol (TIMOPTIC) 0.5 % ophthalmic solution [id number]  Place 1 drop into the right eye 2 (two) times daily. [provider]  Active   triamcinolone  ointment (KENALOG ) 0.5 % [id number]  Apply 1 Application topically 2 (two) times daily. [name], [name], FNP  Active   TRUEplus Lancets 33G MISC [id number]  USE 1 IN THE MORNING, AT NOON, AND AT BEDTIME [name], [name], FNP  Active   valsartan  (DIOVAN ) 80 MG tablet [id number] Yes Take 1 tablet (80  mg total) by mouth daily. [name], [name], FNP  Active               Assessment/Plan:   Diabetes: - Currently uncontrolled with most recent A1C of 11% above goal <7%, and worsened from 8.2% in the setting of running out of Ozempic  due to issues receiving on time from her mail order pharmacy. Patient is tolerating recent increase to Ozempic  2 mg well. She is having occasional AM hypoglycemia since increasing Ozempic . She is at high risk of falls with her age and comorbidities. Insulin  effect may also be increasing with declining renal function. Appropriate to reduce dose by 20%. Patient may benefit from SGLT2i in the future with presence of CKD - though need to ensure her glycemic control has improved first to reduce risk of GU AE.  - Last UACR 01/12/23: 141 mg/g - Patient denies personal or family history of multiple endocrine neoplasia type 2, medullary thyroid cancer; personal history of pancreatitis or gallbladder disease. - Reviewed long term cardiovascular and renal outcomes of uncontrolled blood sugar - Reviewed goal A1c, goal fasting, and goal 2 hour post prandial glucose - Reviewed hypoglycemia management plan and the rule of 15 - Reviewed dietary modifications including  utilizing the healthy plate method, limiting portion size of carbohydrate foods, increasing intake of protein and non-starchy vegetables. Counseled patient to stay hydrated with water  throughout the day. - Reviewed lifestyle modifications including: aiming for 150 minutes of moderate intensity exercise every week.  - Recommend to continue Ozempic  2 mg weekly - Recommend to DECREASE insulin  glargine (Lantus ) to 20 units daily  - Recommend to check glucose twice daily: fasting and 2-hr PPG . Counseled patient to bring glucometer or BG log to every appointment. Patient is not interested in retrial of CGM due to sensors not lasting long enough on her skin and cost. - Next A1C due August 2025     Hypertension: -  Currently uncontrolled with clinic BP consistently above goal less than 130/80. Additionally, patient is having persistent mild hyperkalemia in the setting of worsening CKD. Will have patient come in for repeat labs. If K remains elevated, will reduce dose or discontinue valsartan . May  be appropriate to replace with carvedilol or hydralazine  to maintain BP control.  - Reviewed long term cardiovascular and renal outcomes of uncontrolled blood pressure - Recommend to repeat BMP and BP check on 10/04/23. If K remains elevated, discontinue valsartan  and start alternative medication for BP control like carvedilol (if HR allows)    Hyperlipidemia/ASCVD Risk Reduction: - Currently controlled with most recent LDL-C of 33 mg/dL below goal < 70 mg/dL given U7If + comorbidities. Moderate intensity statin indicated. - Reviewed long term complications of uncontrolled cholesterol - Recommend to continue atorvastatin  20 mg daily   Patient verbalized understanding of treatment plan.   Follow Up Plan:  Pharmacist telephone 10/08/23 PCP clinic visit 10/19/23   Lorain Baseman, PharmD [facility] [phone]

## 2023-10-04 ENCOUNTER — Ambulatory Visit: Payer: Self-pay

## 2023-10-04 DIAGNOSIS — N1831 Chronic kidney disease, stage 3a: Secondary | ICD-10-CM

## 2023-10-04 NOTE — Progress Notes (Cosign Needed Addendum)
   Blood Pressure Recheck Visit  Name: Rachel Gonzalez MRN: [id number] Date of Birth: [birth year]  Rachel Gonzalez presents today for Blood Pressure recheck with clinical support staff.  Order for BP recheck by Lorain Baseman, ordered on 09/29/2023.   BP Readings from Last 3 Encounters:  07/19/23 (!) 147/61  05/17/23 139/60  04/16/23 (!) 163/59    Current Outpatient Medications  Medication Sig Dispense Refill   acetaminophen  (TYLENOL ) 325 MG tablet Take 2 tablets (650 mg total) by mouth every 6 (six) hours as needed. 36 tablet 0   amLODipine  (NORVASC ) 10 MG tablet TAKE 1 TABLET EVERY DAY 90 tablet 3   atorvastatin  (LIPITOR) 20 MG tablet TAKE 1 TABLET EVERY DAY 90 tablet 3   Blood Glucose Monitoring Suppl DEVI 1 each by Does not apply route in the morning, at noon, and at bedtime. May substitute to any manufacturer covered by patient's insurance. 1 each 0   brimonidine (ALPHAGAN) 0.2 % ophthalmic solution Place 1 drop into the right eye 2 (two) times daily.     EQ ALLERGY RELIEF, CETIRIZINE , 10 MG tablet Take 1 tablet by mouth once daily 90 tablet 0   furosemide  (LASIX ) 20 MG tablet TAKE 1 TABLET EVERY DAY 90 tablet 3   gabapentin  (NEURONTIN ) 300 MG capsule Take 1 capsule (300 mg total) by mouth 2 (two) times daily. 180 capsule 1   Glucose Blood (BLOOD GLUCOSE TEST STRIPS) STRP USE 1 IN THE MORNING, AT NOON, AND AT BEDTIME. 300 strip 1   insulin  glargine (LANTUS  SOLOSTAR) 100 UNIT/ML Solostar Pen Inject 25 Units into the skin daily. 45 mL 1   nystatin  (MYCOSTATIN /NYSTOP ) powder APPLY POWDER TOPICALLY TO AFFECTED AREA 4 TIMES DAILY 15 g 0   nystatin  ointment (MYCOSTATIN ) Apply 1 Application topically 2 (two) times daily. 30 g 0   omeprazole  (PRILOSEC) 20 MG capsule Take 1 capsule by mouth once daily 90 capsule 0   RELION PEN NEEDLE 31G/8MM 31G X 8 MM MISC USE AS DIRECTED 4 TIMES DAILY 100 each 11   Semaglutide , 2 MG/DOSE, 8 MG/3ML SOPN Inject 2 mg as directed once a week. 9 mL 3   timolol  (TIMOPTIC) 0.5 % ophthalmic solution Place 1 drop into the right eye 2 (two) times daily.     triamcinolone  ointment (KENALOG ) 0.5 % Apply 1 Application topically 2 (two) times daily. 30 g 0   TRUEplus Lancets 33G MISC USE 1 IN THE MORNING, AT NOON, AND AT BEDTIME 100 each 1   valsartan  (DIOVAN ) 80 MG tablet Take 1 tablet (80 mg total) by mouth daily. 90 tablet 1   No current facility-administered medications for this visit.    Hypertensive Medication Review: Patient states that they are taking all their hypertensive medications as prescribed and their last dose of hypertensive medications was this morning   Documentation of any medication adherence discrepancies: none  Provider Recommendation:  Spoke to Fola and they stated: Continue current medications and follow up as needed  Patient has been scheduled to follow up with 10/19/2023   Patient has been given provider's recommendations and does not have any questions or concerns at this time. Patient will contact the office for any future questions or concerns.   [employer], RMA

## 2023-10-05 ENCOUNTER — Telehealth: Payer: Self-pay

## 2023-10-05 ENCOUNTER — Ambulatory Visit: Payer: Self-pay | Admitting: Nurse Practitioner

## 2023-10-05 DIAGNOSIS — I10 Essential (primary) hypertension: Secondary | ICD-10-CM

## 2023-10-05 LAB — BASIC METABOLIC PANEL WITH GFR
BUN/Creatinine Ratio: 16 (ref 12–28)
BUN: 34 mg/dL — ABNORMAL HIGH (ref 8–27)
CO2: 21 mmol/L (ref 20–29)
Calcium: 9.4 mg/dL (ref 8.7–10.3)
Chloride: 109 mmol/L — ABNORMAL HIGH (ref 96–106)
Creatinine, Ser: 2.15 mg/dL — ABNORMAL HIGH (ref 0.57–1.00)
Glucose: 92 mg/dL (ref 70–99)
Potassium: 5 mmol/L (ref 3.5–5.2)
Sodium: 144 mmol/L (ref 134–144)
eGFR: 24 mL/min/1.73 — ABNORMAL LOW (ref >59)

## 2023-10-05 MED ORDER — VALSARTAN 40 MG PO TABS: 40.0000 mg | ORAL_TABLET | Freq: Every day | ORAL | 1 refills | Status: AC

## 2023-10-05 NOTE — Telephone Encounter (Signed)
 Copied from CRM [id number]. Topic: Clinical - Lab/Test Results >> Oct 05, 2023 11:08 AM Elle L wrote: Reason for CRM: The patient states she missed a call from the office and would like to speak to Luke but Luke was in a meeting at this time. I did see that the patient had lab results available so I read the note to her verbatim and she expressed understanding.  Pt was called back and she missed a call form Layna . Pharmacy was advised and she called pt back. KH

## 2023-10-05 NOTE — Telephone Encounter (Signed)
 Patient returned my call. Instructed her to stop valsartan  80 mg daily and restart valsartan  40 mg daily when it arrives from her mail order pharmacy. Also provided her with the information to call [facility] to schedule an appointment for evaluation of progressive decline in kidney function.   Will keep pharmacy telephone appt on 10/08/23 to discuss T2DM and ensure nephrology appt was scheduled.   Lorain Baseman, PharmD [facility] [phone]

## 2023-10-05 NOTE — Telephone Encounter (Addendum)
 Attempted to contact patient to follow-up on repeat BMP. Discussed with PCP, Lorice Shall, NP and would like patient to decrease valsartan  to 40 mg daily given recent bump in Scr and difficulty keeping potassium below 5 mEq/L. Will also encouraged patient to follow-up with nephrology. Patient reinitiated valsartan  in Feb 2025, and since then Cr has bumped from 1.75 mg/dL to 7.84 mg/dL. BP were well controlled on recheck yesterday (10/04/23).      Latest Ref Rng & Units 10/04/2023    7:59 AM 07/26/2023    2:55 PM 07/19/2023   10:27 AM  BMP  Glucose 70 - 99 mg/dL 92  760  846   BUN 8 - 27 mg/dL 34  37  34   Creatinine 0.57 - 1.00 mg/dL 7.84  8.09  7.92   BUN/Creat Ratio 12 - 28 16  19  16    Sodium 134 - 144 mmol/L 144  140  142   Potassium 3.5 - 5.2 mmol/L 5.0  5.4  5.3   Chloride 96 - 106 mmol/L 109  105  106   CO2 20 - 29 mmol/L 21  22  22    Calcium  8.7 - 10.3 mg/dL 9.4  9.1  9.4    BP Readings from Last 3 Encounters:  10/05/23 (!) 126/50  07/19/23 (!) 147/61  05/17/23 139/60   Left VM with call back instructions. Will re-attempt to outreach.   Lorain Baseman, PharmD [facility] [phone]

## 2023-10-08 ENCOUNTER — Other Ambulatory Visit: Payer: Self-pay

## 2023-10-08 DIAGNOSIS — E1165 Type 2 diabetes mellitus with hyperglycemia: Secondary | ICD-10-CM

## 2023-10-08 NOTE — Progress Notes (Signed)
 10/08/2023 Name: Rachel Gonzalez MRN: [id number] DOB: Feb 28, 1951  Chief Complaint  Patient presents with   Diabetes   Hypertension    Rachel Gonzalez is a 73 y.o. year old female who presented for a telephone visit.   They were referred to the pharmacist by their PCP for assistance in managing diabetes. PMH includes HTN, T2DM with polyneuropathy, CKD3 (eGFR 28 mL/min), BMI > 30, HLD, frequent falls.   Subjective: Patient was last seen by PCP, Lorice Shall, NP, on 07/19/23. At last visit, BP was elevated to 147/61 mmHg, HR 73. She reported that she had recently ran out of Ozempic  for several weeks but has been able to restart the medication. She was instructed to increase to Ozempic  2 mg at next fill. She reported that she is currently taking car of her husband who has cancer and is undergoing treatment. Patient has had elevated potassium 5.3 mEq/L and 5.4 mEq/L on last two BMP, in the setting of eGFR < 30 mL/min. She was instructed to take Lokelma  after hyperkalemia did not resolve. She did not return for repeat labs the following week.  Patient was first engaged by pharmacy on 09/29/23, she was instructed to decrease insulin  glargine (Lantus ) to 20 units daily due to hypoglycemia. She also was instructed to return to [facility] for a BP check and f/u labs. On 10/04/23, she her BP was 126/50 mmHg in-office. On BMP, her potassium had stabilized but her Scr continued to rise. She was instructed to hold valsartan  80 mg, and restart at 40 mg daily when it arrived from her mail order pharmacy. She was also instructed to schedule an appt with nephrology.  Today, patient reports that she called [facility] and they did not let her schedule an appt because the referral had expired - however the referral in Epic states that it is active until Feb 2026. She reports that she has not been making healthy diet choices over the past few days because she has been eating with friends and family in the setting of a friends  passing, so her BG have been higher the past couple days.    Care Team: Primary Care Provider: [name], [name], FNP ; Next Scheduled Visit: 10/19/23 Nephrology: Needs to be scheduled  Medication Access/Adherence  Current Pharmacy:  [facility] [address], [name] - [address] [name] [address] Phone: [phone] Fax: [phone]  [facility] - [place], [place] - [name] [place] [address] Phone: [phone] Fax: [phone]   Patient reports affordability concerns with their medications: No  Patient reports access/transportation concerns to their pharmacy: No  Patient reports adherence concerns with their medications:  Yes  - Was out of insulin  for 3 days last week (had to request refills and mail order), but issue has been resolved.   Freestyle Libre 3+: $29.13 per 30ds - patient reports this was too costly for her   Diabetes:  Current medications: Ozempic  2 mg weekly (Fridays), Lantus  20 units daily  Using Truemetrix (?) meter Current glucose readings: checking once daily in the morning (FBG) > 8:30AM this AM 171 mg/dL, 800 mg/dL - reports that this is likely due to dietary indiscretion as she has had to attend a funeral and wake this week.  Reports that FL3+ did not last long enough in the past, kept falling off early. Also had issues affording it.   Patient reports hypoglycemic s/sx including dizziness, shakiness,  sweating - 1-2x when she was waking up at 8:30-9AM (69 mg/dL, 70 mg/dL) about 2 weeks ago. Patient denies hyperglycemic symptoms including polyuria, polydipsia, polyphagia, nocturia, neuropathy, blurred vision.  Current meal patterns: Eats 3 meals per day + 1 snack. Endorses having protein with each meal.  - Drinks: Reports having coke out to eat last Sunday (but infrequently), water  throughout the day  Current physical activity: no purposeful  activity  Hypertension:  Current medications: valsartan  40 mg daily (has not received in the mail yet, not taking any valsartan ), amlodipine  10 mg daily, furosemide  20 mg daily Medications previously tried: valsartan  80 mg (elevated K and Scr)  Patient does not have a validated, automated, upper arm home BP cuff Current blood pressure readings readings: N/A  Patient denies hypotensive s/sx including dizziness, lightheadedness.  Patient denies hypertensive symptoms including headache, chest pain, shortness of breath  Patient denies LEE.  Hyperlipidemia/ASCVD Risk Reduction  Current lipid lowering medications: atorvastatin  20 mg daily   Clinical ASCVD: No  The ASCVD Risk score (Arnett DK, et al., 2019) failed to calculate for the following reasons:   Cannot find a previous HDL lab   Cannot find a previous total cholesterol lab    Objective:  BP Readings from Last 3 Encounters:  10/05/23 (!) 126/50  07/19/23 (!) 147/61  05/17/23 139/60    Lab Results  Component Value Date   HGBA1C 11.0 (A) 07/19/2023   HGBA1C 8.2 (A) 04/16/2023   HGBA1C 10.4 (A) 01/12/2023       Latest Ref Rng & Units 10/04/2023    7:59 AM 07/26/2023    2:55 PM 07/19/2023   10:27 AM  BMP  Glucose 70 - 99 mg/dL 92  760  846   BUN 8 - 27 mg/dL 34  37  34   Creatinine 0.57 - 1.00 mg/dL 7.84  8.09  7.92   BUN/Creat Ratio 12 - 28 16  19  16    Sodium 134 - 144 mmol/L 144  140  142   Potassium 3.5 - 5.2 mmol/L 5.0  5.4  5.3   Chloride 96 - 106 mmol/L 109  105  106   CO2 20 - 29 mmol/L 21  22  22    Calcium  8.7 - 10.3 mg/dL 9.4  9.1  9.4     Lab Results  Component Value Date   CHOL 156 06/26/2019   HDL 78 06/26/2019   LDLCALC 68 06/26/2019   LDLDIRECT 33 04/16/2023   TRIG 43 06/26/2019   CHOLHDL 2.0 06/26/2019    Medications Reviewed Today     Reviewed by Brinda Lorain SQUIBB, RPH (Pharmacist) on 10/08/23 at 1322  Med List Status: <None>   Medication Order Taking? Sig Documenting Provider Last Dose  Status Informant  acetaminophen  (TYLENOL ) 325 MG tablet [id number]  Take 2 tablets (650 mg total) by mouth every 6 (six) hours as needed. Elnor Jayson LABOR, DO  Active            Med Note SAMULE, [facility]   Tue Apr 07, 2022 10:10 AM) Prn   amLODipine  (NORVASC ) 10 MG tablet [id number] Yes TAKE 1 TABLET EVERY DAY Nichols, Tonya S, NP  Active   atorvastatin  (LIPITOR) 20 MG tablet [id number] Yes TAKE 1 TABLET EVERY DAY Nichols, Tonya S, NP  Active   Blood Glucose Monitoring Suppl DEVI [id number]  1 each by Does not apply route in the morning, at noon, and at bedtime. May substitute to any manufacturer covered by patient's insurance. [name], [name], FNP  Active   brimonidine (ALPHAGAN) 0.2 % ophthalmic solution [id number]  Place 1 drop into the right eye 2 (two) times daily. [provider]  Active   EQ ALLERGY RELIEF, CETIRIZINE , 10 MG tablet [id number]  Take 1 tablet by mouth once daily Hollis, Lachina M, FNP  Active   furosemide  (LASIX ) 20 MG tablet [id number] Yes TAKE 1 TABLET EVERY DAY Nichols, Tonya S, NP  Active   gabapentin  (NEURONTIN ) 300 MG capsule [id number] Yes Take 1 capsule (300 mg total) by mouth 2 (two) times daily. [name], [name], FNP  Active   Glucose Blood (BLOOD GLUCOSE TEST STRIPS) STRP [id number]  USE 1 IN THE MORNING, AT NOON, AND AT BEDTIME. [name], [name], FNP  Active   insulin  glargine (LANTUS  SOLOSTAR) 100 UNIT/ML Solostar Pen [id number] Yes Inject 25 Units into the skin daily.  Patient taking differently: Inject 25 Units into the skin daily.   Tilford Bertram HERO, FNP  Active   nystatin  (MYCOSTATIN /NYSTOP ) powder [id number]  APPLY POWDER TOPICALLY TO AFFECTED AREA 4 TIMES DAILY [name], [name], FNP  Active   nystatin  ointment (MYCOSTATIN ) [id number]  Apply 1 Application topically 2 (two) times daily. [name], [name], FNP  Active   omeprazole  (PRILOSEC) 20 MG capsule [id number] Yes Take 1 capsule by mouth once daily [name], [name], FNP  Active   RELION PEN  NEEDLE 31G/8MM 31G X 8 MM MISC [id number]  USE AS DIRECTED 4 TIMES DAILY Vicenta Maduro, FNP  Active Self  Semaglutide , 2 MG/DOSE, 8 MG/3ML SOPN [id number] Yes Inject 2 mg as directed once a week. [name], [name], FNP  Active   timolol (TIMOPTIC) 0.5 % ophthalmic solution [id number]  Place 1 drop into the right eye 2 (two) times daily. [provider]  Active   triamcinolone  ointment (KENALOG ) 0.5 % [id number]  Apply 1 Application topically 2 (two) times daily. [name], [name], FNP  Active   TRUEplus Lancets 33G MISC [id number]  USE 1 IN THE MORNING, AT NOON, AND AT BEDTIME [name], [name], FNP  Active   valsartan  (DIOVAN ) 40 MG tablet [id number]  Take 1 tablet (40 mg total) by mouth daily.  Patient not taking: Reported on 10/08/2023   [name], [name], FNP  Active               Assessment/Plan:   Diabetes: - Currently uncontrolled with most recent A1C of 11% above goal <7%, and worsened from 8.2% in the setting of running out of Ozempic  due to issues receiving on time from her mail order pharmacy. Patient is tolerating recent increase to Ozempic  2 mg well. We decreased her basal insulin  due to hypoglycemia about a week ago, but it is difficult to assess the efficacy of the dose as she has not been making healthy food choices over the past few days.  Patient may benefit from SGLT2i in the future with presence of CKD - though need to ensure her glycemic control has improved first to reduce risk of GU AE.  - Last UACR 01/12/23: 141 mg/g - Patient denies personal or family history of multiple endocrine neoplasia type 2, medullary thyroid cancer; personal history of pancreatitis or gallbladder disease. - Reviewed long term cardiovascular and renal outcomes of uncontrolled blood sugar - Reviewed goal A1c, goal fasting, and goal 2 hour post prandial glucose - Reviewed hypoglycemia management plan and the rule of 15 - Reviewed dietary modifications including  utilizing the healthy  plate method, limiting portion size of carbohydrate foods, increasing intake of protein  and non-starchy vegetables. Counseled patient to stay hydrated with water  throughout the day. - Reviewed lifestyle modifications including: aiming for 150 minutes of moderate intensity exercise every week.  - Recommend to continue Ozempic  2 mg weekly - Recommend to continueinsulin glargine (Lantus ) to 20 units daily  - Recommend to check glucose twice daily: fasting and 2-hr PPG . Counseled patient to bring glucometer or BG log to every appointment. Patient is not interested in retrial of CGM due to sensors not lasting long enough on her skin and cost. - Next A1C due August 2025     Hypertension: - Currently controlled with clinic BP less than 130/80. Patient is still holding valsartan  and awaiting shipment for lower dose. Will plan to repeat BMP at PCP appt on 10/19/23.  - Reviewed long term cardiovascular and renal outcomes of uncontrolled blood pressure - Recommend to continue amlodipine  10 mg daily, furosemide  20 mg daily - Recommend to start valsartan  40 mg daily when it arrives via mail order - Recommend to repeat BMP on 10/19/23   Hyperlipidemia/ASCVD Risk Reduction: - Currently controlled with most recent LDL-C of 33 mg/dL below goal < 70 mg/dL given U7If + comorbidities. Moderate intensity statin indicated. - Reviewed long term complications of uncontrolled cholesterol - Recommend to continue atorvastatin  20 mg daily   Patient verbalized understanding of treatment plan.   Follow Up Plan:  Pharmacist in-person 11/30/23 PCP clinic visit 10/19/23   Lorain Baseman, PharmD [facility] [phone]

## 2023-10-11 ENCOUNTER — Other Ambulatory Visit: Payer: Self-pay | Admitting: Nurse Practitioner

## 2023-10-11 DIAGNOSIS — N183 Chronic kidney disease, stage 3 unspecified: Secondary | ICD-10-CM

## 2023-10-15 ENCOUNTER — Other Ambulatory Visit: Payer: Self-pay | Admitting: Nurse Practitioner

## 2023-10-15 DIAGNOSIS — K219 Gastro-esophageal reflux disease without esophagitis: Secondary | ICD-10-CM

## 2023-10-19 ENCOUNTER — Ambulatory Visit: Payer: Self-pay | Admitting: Nurse Practitioner

## 2023-11-11 ENCOUNTER — Telehealth: Payer: Self-pay | Admitting: Nurse Practitioner

## 2023-11-11 NOTE — Telephone Encounter (Signed)
 Patient was identified as falling into the [place] Measure - Diabetes.   Patient was: Appointment already scheduled for:  11/29/2023.  Patient was identified as falling into the [place] Measure - Diabetes.   Patient was: Referred to pharmacy for chronic disease management.

## 2023-11-15 ENCOUNTER — Other Ambulatory Visit: Payer: Self-pay | Admitting: Nurse Practitioner

## 2023-11-24 DIAGNOSIS — Z961 Presence of intraocular lens: Secondary | ICD-10-CM | POA: Diagnosis not present

## 2023-11-24 DIAGNOSIS — H401121 Primary open-angle glaucoma, left eye, mild stage: Secondary | ICD-10-CM | POA: Diagnosis not present

## 2023-11-24 DIAGNOSIS — H401113 Primary open-angle glaucoma, right eye, severe stage: Secondary | ICD-10-CM | POA: Diagnosis not present

## 2023-11-29 ENCOUNTER — Encounter: Payer: Self-pay | Admitting: Nurse Practitioner

## 2023-11-29 ENCOUNTER — Ambulatory Visit (INDEPENDENT_AMBULATORY_CARE_PROVIDER_SITE_OTHER): Payer: Self-pay | Admitting: Nurse Practitioner

## 2023-11-29 VITALS — BP 142/59 | HR 66 | Temp 98.3°F | Resp 17 | Ht 64.0 in | Wt 195.0 lb

## 2023-11-29 DIAGNOSIS — L304 Erythema intertrigo: Secondary | ICD-10-CM | POA: Insufficient documentation

## 2023-11-29 DIAGNOSIS — Z23 Encounter for immunization: Secondary | ICD-10-CM | POA: Diagnosis not present

## 2023-11-29 DIAGNOSIS — I10 Essential (primary) hypertension: Secondary | ICD-10-CM

## 2023-11-29 DIAGNOSIS — Z0000 Encounter for general adult medical examination without abnormal findings: Secondary | ICD-10-CM | POA: Insufficient documentation

## 2023-11-29 DIAGNOSIS — E785 Hyperlipidemia, unspecified: Secondary | ICD-10-CM

## 2023-11-29 DIAGNOSIS — N184 Chronic kidney disease, stage 4 (severe): Secondary | ICD-10-CM

## 2023-11-29 DIAGNOSIS — M25552 Pain in left hip: Secondary | ICD-10-CM | POA: Insufficient documentation

## 2023-11-29 DIAGNOSIS — E1165 Type 2 diabetes mellitus with hyperglycemia: Secondary | ICD-10-CM | POA: Diagnosis not present

## 2023-11-29 DIAGNOSIS — Z1211 Encounter for screening for malignant neoplasm of colon: Secondary | ICD-10-CM | POA: Diagnosis not present

## 2023-11-29 LAB — POCT GLYCOSYLATED HEMOGLOBIN (HGB A1C): Hemoglobin A1C: 9.2 % — AB (ref 4.0–5.6)

## 2023-11-29 MED ORDER — NYSTATIN 100000 UNIT/GM EX POWD
CUTANEOUS | 0 refills | Status: DC
Start: 2023-11-29 — End: 2024-01-20

## 2023-11-29 MED ORDER — NYSTATIN 100000 UNIT/GM EX OINT: 1.0000 | TOPICAL_OINTMENT | Freq: Two times a day (BID) | CUTANEOUS | 0 refills | Status: DC

## 2023-11-29 MED ORDER — LANTUS SOLOSTAR 100 UNIT/ML ~~LOC~~ SOPN: 24.0000 [IU] | PEN_INJECTOR | Freq: Every day | SUBCUTANEOUS | 1 refills | Status: DC

## 2023-11-29 MED ORDER — LIDOCAINE 5 % EX PTCH: 1.0000 | MEDICATED_PATCH | CUTANEOUS | 0 refills | Status: AC

## 2023-11-29 NOTE — Assessment & Plan Note (Signed)
 Lab Results  Component Value Date   CHOL 156 06/26/2019   HDL 78 06/26/2019   LDLCALC 68 06/26/2019   LDLDIRECT 33 04/16/2023   TRIG 43 06/26/2019   CHOLHDL 2.0 06/26/2019  Controlled on atorvastatin  20 mg daily Recheck labs at next visit

## 2023-11-29 NOTE — Assessment & Plan Note (Signed)
 Lab Results  Component Value Date   HGBA1C 9.2 (A) 11/29/2023   Type 2 diabetes mellitus with hyperglycemia A1c improved but remains above target. Dietary habits contribute to hyperglycemia. Goal: A1c <7.0 to reduce cardiovascular risk. - Increase insulin  glargine to 24 units daily.continue ozempic  2mg  once weekly  - Encourage dietary modifications: reduce sugar, sweets, soda, rice, bread, and pasta. - Reschedule clinical pharmacy appointment to one month from now. - Monitor for hypoglycemia and report if blood sugar consistently falls below 70 mg/dL. Keep upcoming appointment with opthamologist

## 2023-11-29 NOTE — Patient Instructions (Addendum)
 Goal for fasting blood sugar ranges from 80 to 120 and 2 hours after any meal or at bedtime should be between 130 to 170.   Please increase Lantus  to 24 units daily, continue Ozempic  2 mg once weekly  Please get your x-ray done at [facility] Address: [address] [place], [place], [name] [address] Phone: [phone]    1. Uncontrolled type 2 diabetes mellitus with hyperglycemia (HCC)  - POCT glycosylated hemoglobin (Hb A1C) - insulin  glargine (LANTUS  SOLOSTAR) 100 UNIT/ML Solostar Pen; Inject 24 Units into the skin daily.  Dispense: 45 mL; Refill: 1  2. Intertrigo   3. Acute hip pain, left (Primary)  - DG Hip Unilat W OR W/O Pelvis 2-3 Views Left - lidocaine  (LIDODERM ) 5 %; Place 1 patch onto the skin daily. Remove & Discard patch within 12 hours or as directed by MD  Dispense: 30 patch; Refill: 0 Okay to take Tylenol  650 mg every 6 hours as needed for pain, also encourage use of heating pad or ice  4. Screening for colon cancer  - Ambulatory referral to Gastroenterology   It is important that you exercise regularly at least 30 minutes 5 times a week as tolerated  Think about what you will eat, plan ahead. Choose  clean, green, fresh or frozen over canned, processed or packaged foods which are more sugary, salty and fatty. 70 to 75% of food eaten should be vegetables and fruit. Three meals at set times with snacks allowed between meals, but they must be fruit or vegetables. Aim to eat over a 12 hour period , example 7 am to 7 pm, and STOP after  your last meal of the day. Drink water ,generally about 64 ounces per day, no other drink is as healthy. Fruit juice is best enjoyed in a healthy way, by EATING the fruit.  Thanks for choosing [facility] we consider it a privelige to serve you.

## 2023-11-29 NOTE — Assessment & Plan Note (Signed)
  Flu vaccination due. Colon cancer screening pending; colonoscopy preferred. Diabetic eye exam scheduled. - Administer flu vaccine for the current season. - Refer for colonoscopy for colon cancer screening. - Attend diabetic eye exam appointment and ensure results are sent to the primary care provider.

## 2023-11-29 NOTE — Assessment & Plan Note (Signed)
 She likes to alternate nystatin  powder and ointment, prefers to use the powder when going out and appointment when at home Medications refilled

## 2023-11-29 NOTE — Assessment & Plan Note (Signed)
 BP Readings from Last 3 Encounters:  11/29/23 (!) 142/59  10/05/23 (!) 126/50  07/19/23 (!) 147/61   Blood pressure slightly elevated. Goal: maintain <130/80 mmHg. Current medications: amlodipine  10mg  daily , furosemide  20mg  daily  valsartan  40mg  daily . Nephrology consultation pending. - Continue current antihypertensive regimen: amlodipine , furosemide , and valsartan . - Encourage a heart-healthy, low-salt, low-fat diet. - Recommend moderate exercise as tolerated, 30 minutes five days a week. - Keep nephrology appointment for further management.

## 2023-11-29 NOTE — Assessment & Plan Note (Addendum)
  Assessment & Plan Lab Results  Component Value Date   NA 144 10/04/2023   K 5.0 10/04/2023   CO2 21 10/04/2023   GLUCOSE 92 10/04/2023   BUN 34 (H) 10/04/2023   CREATININE 2.15 (H) 10/04/2023   CALCIUM  9.4 10/04/2023   GFR 63.19 11/07/2014   EGFR 24 (L) 10/04/2023   GFRNONAA 36 (L) 04/07/2022   Avoid NSAIDS and other nephrotoxic agents  Chronic kidney disease under monitoring. Nephrology appointment scheduled. - Attend nephrology appointment at [facility].

## 2023-11-29 NOTE — Assessment & Plan Note (Signed)
  New onset right hip pain, possibly due to arthritis. Previous x-ray showed arthritis. Pain affects mobility. - Order x-ray of the right hip to assess for arthritis. - Prescribe lidocaine  patch for pain management. - Advise use of acetaminophen  650 mg every six hours as needed for pain. - Avoid NSAIDs like Aleve  and ibuprofen  due to potential kidney harm.

## 2023-11-29 NOTE — Progress Notes (Signed)
 Established Patient Office Visit  Subjective:  Patient ID: Rachel Gonzalez, female    DOB: [birth year]  Age: 73 y.o. MRN: [id number]  CC:  Chief Complaint  Patient presents with   Diabetes    HPI   Discussed the use of AI scribe software for clinical note transcription with the patient, who gave verbal consent to proceed.  History of Present Illness Rachel Gonzalez is a 73 year old female with hypertension, diabetes, and chronic kidney disease who presents for follow-up on her blood pressure and diabetes management.  Her blood pressure remains elevated with a systolic reading of 142 mmHg, despite taking amlodipine  10 mg daily, furosemide  20 mg daily, and valsartan  40 mg daily. She is scheduled to see a kidney specialist later this month for further evaluation of her chronic kidney disease.  Her diabetes management is ongoing, with her current A1c at 9.2, improved from 11.0 four months ago. She is taking Ozempic  2 mg once a week and Lantus  20 units daily, which will be increased to 24 units. Her morning blood sugar was 89 today. She faces dietary challenges due to stress, leading to less controlled eating habits.  She reports new onset left hip pain, described as tender and painful to touch, with a pain level of 5 out of 10. The pain has been present for two to three days, affecting her ability to walk, necessitating the use of a cane. She has used a muscle relaxer cream for relief. No fever, chills, chest pain, or shortness of breath.  She has not completed her colon cancer screening and prefers to undergo a colonoscopy rather than using the Cologuard test. She has an upcoming appointment for a diabetic eye exam this Friday.   Assessment & Plan     Past Medical History:  Diagnosis Date   Anemia    Arthritis    right finger   CHF (congestive heart failure) (HCC)    Chronic renal disease, stage 3, moderately decreased glomerular filtration rate (GFR) between 30-59 mL/min/1.73  square meter (HCC)    Diabetes mellitus    GERD (gastroesophageal reflux disease)    Hypertension     Past Surgical History:  Procedure Laterality Date   AMPUTATION Right 05/08/2014   Procedure: AMPUTATION RAY, right great toe;  Surgeon: Jerona Harden GAILS, MD;  Location: [facility] OR;  Service: Orthopedics;  Laterality: Right;   arm surgery      CESAREAN SECTION     COLONOSCOPY     I & D EXTREMITY Right 05/08/2014   Procedure: IRRIGATION AND DEBRIDEMENT FOOT;  Surgeon: Jerona Harden GAILS, MD;  Location: [facility] OR;  Service: Orthopedics;  Laterality: Right;   MEMBRANE PEEL Right 03/15/2018   Procedure: MEMBRANE PEEL;  Surgeon: Tobie Baptist, MD;  Location: [facility] OR;  Service: Ophthalmology;  Laterality: Right;   PHOTOCOAGULATION WITH LASER Right 03/15/2018   Procedure: PHOTOCOAGULATION WITH LASER;  Surgeon: Tobie Baptist, MD;  Location: [facility] OR;  Service: Ophthalmology;  Laterality: Right;   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/2018   Procedure: RIGHT EYE COMPLEX RETINA DETACHMENT VITRECTOMY MEMBRANE PEEL WITH AIR,GAS,SILICONE OIL PHOTOCOAGULATION;  Surgeon: Tobie Baptist, MD;  Location: [facility] OR;  Service: Ophthalmology;  Laterality: Right;    Family History  Problem Relation Age of Onset   Diabetes Mother    Breast cancer Neg Hx     Social History   Socioeconomic History   Marital status: Married    Spouse name: Not on file   Number of  children: Not on file   Years of education: Not on file   Highest education level: Not on file  Occupational History   Not on file  Tobacco Use   Smoking status: Never   Smokeless tobacco: Never  Vaping Use   Vaping status: Never Used  Substance and Sexual Activity   Alcohol use: No   Drug use: No   Sexual activity: Not on file  Other Topics Concern   Not on file  Social History Narrative   From [place] .   Married.   Three children, 9 grandchildren.   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   Travels  to [place], [address]   Social Drivers of Health   Financial Resource Strain: Low Risk  (07/06/2023)   Overall Financial Resource Strain (CARDIA)    Difficulty of Paying Living Expenses: Not hard at all  Food Insecurity: No Food Insecurity (07/06/2023)   Hunger Vital Sign    Worried About Running Out of Food in the Last Year: Never true    Ran Out of Food in the Last Year: Never true  Transportation Needs: No Transportation Needs (07/06/2023)   PRAPARE - Administrator, Civil Service (Medical): No    Lack of Transportation (Non-Medical): No  Physical Activity: Insufficiently Active (07/06/2023)   Exercise Vital Sign    Days of Exercise per Week: 2 days    Minutes of Exercise per Session: 10 min  Stress: No Stress Concern Present (07/06/2023)   [employer] of Occupational Health - Occupational Stress Questionnaire    Feeling of Stress : Not at all  Social Connections: Socially Integrated (07/06/2023)   Social Connection and Isolation Panel    Frequency of Communication with Friends and Family: More than three times a week    Frequency of Social Gatherings with Friends and Family: More than three times a week    Attends Religious Services: More than 4 times per year    Active Member of [employer] or Organizations: No    Attends Engineer, structural: More than 4 times per year    Marital Status: Married  Catering manager Violence: Not At Risk (07/06/2023)   Humiliation, Afraid, Rape, and Kick questionnaire    Fear of Current or Ex-Partner: No    Emotionally Abused: No    Physically Abused: No    Sexually Abused: No    Outpatient Medications Prior to Visit  Medication Sig Dispense Refill   acetaminophen  (TYLENOL ) 325 MG tablet Take 2 tablets (650 mg total) by mouth every 6 (six) hours as needed. 36 tablet 0   amLODipine  (NORVASC ) 10 MG tablet TAKE 1 TABLET EVERY DAY 90 tablet 3   atorvastatin  (LIPITOR) 20 MG tablet TAKE 1 TABLET EVERY DAY 90 tablet 3   Blood  Glucose Monitoring Suppl DEVI 1 each by Does not apply route in the morning, at noon, and at bedtime. May substitute to any manufacturer covered by patient's insurance. 1 each 0   brimonidine (ALPHAGAN) 0.2 % ophthalmic solution Place 1 drop into the right eye 2 (two) times daily.     furosemide  (LASIX ) 20 MG tablet TAKE 1 TABLET EVERY DAY 90 tablet 3   gabapentin  (NEURONTIN ) 300 MG capsule Take 1 capsule (300 mg total) by mouth 2 (two) times daily. 180 capsule 1   Glucose Blood (BLOOD GLUCOSE TEST STRIPS) STRP USE 1 IN THE MORNING, AT NOON, AND AT BEDTIME. 300 strip 1   omeprazole  (PRILOSEC)  20 MG capsule Take 1 capsule by mouth once daily 90 capsule 0   RELION PEN NEEDLE 31G/8MM 31G X 8 MM MISC USE AS DIRECTED 4 TIMES DAILY 100 each 11   Semaglutide , 2 MG/DOSE, 8 MG/3ML SOPN Inject 2 mg as directed once a week. 9 mL 3   timolol (TIMOPTIC) 0.5 % ophthalmic solution Place 1 drop into the right eye 2 (two) times daily.     TRUEplus Lancets 33G MISC CHECK BLOOD SUGAR IN THE MORNING, AT NOON, AND AT BEDTIME. 200 each 5   insulin  glargine (LANTUS  SOLOSTAR) 100 UNIT/ML Solostar Pen Inject 25 Units into the skin daily. 45 mL 1   nystatin  (MYCOSTATIN /NYSTOP ) powder APPLY POWDER TOPICALLY TO AFFECTED AREA 4 TIMES DAILY 15 g 0   nystatin  ointment (MYCOSTATIN ) Apply 1 Application topically 2 (two) times daily. 30 g 0   EQ ALLERGY RELIEF, CETIRIZINE , 10 MG tablet Take 1 tablet by mouth once daily (Patient not taking: Reported on 11/29/2023) 90 tablet 0   triamcinolone  ointment (KENALOG ) 0.5 % Apply 1 Application topically 2 (two) times daily. 30 g 0   valsartan  (DIOVAN ) 40 MG tablet Take 1 tablet (40 mg total) by mouth daily. (Patient not taking: Reported on 11/29/2023) 90 tablet 1   No facility-administered medications prior to visit.    Allergies  Allergen Reactions   Tramadol Nausea And Vomiting   Adhesive [Tape] Itching   Bactrim  [Sulfamethoxazole -Trimethoprim ] Other (See Comments)    AKI and  hyperkalemia    Tylenol  With Codeine  #3 [Acetaminophen -Codeine ] Diarrhea and Itching    Only allergic to Codeine , patient reports no allergy to Tylenol .    ROS Review of Systems  Constitutional:  Negative for appetite change, chills, fatigue and fever.  HENT:  Negative for congestion, postnasal drip, rhinorrhea and sneezing.   Respiratory:  Negative for cough, shortness of breath and wheezing.   Cardiovascular:  Negative for chest pain, palpitations and leg swelling.  Gastrointestinal:  Negative for abdominal pain, constipation, nausea and vomiting.  Genitourinary:  Negative for difficulty urinating, dysuria, flank pain and frequency.  Musculoskeletal:  Positive for arthralgias. Negative for joint swelling and myalgias.  Skin:  Negative for color change, pallor, rash and wound.  Neurological:  Negative for dizziness, facial asymmetry, weakness, numbness and headaches.  Psychiatric/Behavioral:  Negative for behavioral problems, confusion, self-injury and suicidal ideas.       Objective:    Physical Exam Vitals and nursing note reviewed.  Constitutional:      General: She is not in acute distress.    Appearance: Normal appearance. She is obese. She is not ill-appearing, toxic-appearing or diaphoretic.  Eyes:     General: No scleral icterus.       Right eye: No discharge.        Left eye: No discharge.     Extraocular Movements: Extraocular movements intact.     Conjunctiva/sclera: Conjunctivae normal.  Cardiovascular:     Rate and Rhythm: Normal rate and regular rhythm.     Pulses: Normal pulses.     Heart sounds: Normal heart sounds. No murmur heard.    No friction rub. No gallop.  Pulmonary:     Effort: Pulmonary effort is normal. No respiratory distress.     Breath sounds: Normal breath sounds. No stridor. No wheezing, rhonchi or rales.  Chest:     Chest wall: No tenderness.  Abdominal:     General: There is no distension.     Palpations: Abdomen is soft.  Tenderness: There is no abdominal tenderness. There is no right CVA tenderness, left CVA tenderness or guarding.  Musculoskeletal:        General: No swelling, tenderness, deformity or signs of injury.     Right lower leg: No edema.     Left lower leg: No edema.  Skin:    General: Skin is warm and dry.     Capillary Refill: Capillary refill takes less than 2 seconds.     Coloration: Skin is not jaundiced or pale.     Findings: No bruising, erythema or lesion.  Neurological:     Mental Status: She is alert and oriented to person, place, and time.     Motor: No weakness.     Gait: Gait normal.  Psychiatric:        Mood and Affect: Mood normal.        Behavior: Behavior normal.        Thought Content: Thought content normal.        Judgment: Judgment normal.     BP (!) 142/59   Pulse 66   Wt 195 lb (88.5 kg)   SpO2 100%   BMI 34.54 kg/m  Wt Readings from Last 3 Encounters:  11/29/23 195 lb (88.5 kg)  07/19/23 204 lb (92.5 kg)  07/06/23 203 lb (92.1 kg)    Lab Results  Component Value Date   TSH 0.838 04/10/2020   Lab Results  Component Value Date   WBC 5.2 12/15/2022   HGB 10.4 (L) 12/15/2022   HCT 34.3 12/15/2022   MCV 89 12/15/2022   PLT 225 12/15/2022   Lab Results  Component Value Date   NA 144 10/04/2023   K 5.0 10/04/2023   CO2 21 10/04/2023   GLUCOSE 92 10/04/2023   BUN 34 (H) 10/04/2023   CREATININE 2.15 (H) 10/04/2023   BILITOT 0.2 04/16/2023   ALKPHOS 184 (H) 04/16/2023   AST 14 04/16/2023   ALT 8 04/16/2023   PROT 6.6 04/16/2023   ALBUMIN 4.1 04/16/2023   CALCIUM  9.4 10/04/2023   ANIONGAP 8 04/07/2022   EGFR 24 (L) 10/04/2023   GFR 63.19 11/07/2014   Lab Results  Component Value Date   CHOL 156 06/26/2019   Lab Results  Component Value Date   HDL 78 06/26/2019   Lab Results  Component Value Date   LDLCALC 68 06/26/2019   Lab Results  Component Value Date   TRIG 43 06/26/2019   Lab Results  Component Value Date   CHOLHDL 2.0  06/26/2019   Lab Results  Component Value Date   HGBA1C 9.2 (A) 11/29/2023      Assessment & Plan:   Problem List Items Addressed This Visit       Cardiovascular and Mediastinum   Essential hypertension   BP Readings from Last 3 Encounters:  11/29/23 (!) 142/59  10/05/23 (!) 126/50  07/19/23 (!) 147/61   Blood pressure slightly elevated. Goal: maintain <130/80 mmHg. Current medications: amlodipine  10mg  daily , furosemide  20mg  daily  valsartan  40mg  daily . Nephrology consultation pending. - Continue current antihypertensive regimen: amlodipine , furosemide , and valsartan . - Encourage a heart-healthy, low-salt, low-fat diet. - Recommend moderate exercise as tolerated, 30 minutes five days a week. - Keep nephrology appointment for further management.         Endocrine   Uncontrolled type 2 diabetes mellitus with hyperglycemia (HCC)   Lab Results  Component Value Date   HGBA1C 9.2 (A) 11/29/2023   Type 2 diabetes mellitus with hyperglycemia  A1c improved but remains above target. Dietary habits contribute to hyperglycemia. Goal: A1c <7.0 to reduce cardiovascular risk. - Increase insulin  glargine to 24 units daily.continue ozempic  2mg  once weekly  - Encourage dietary modifications: reduce sugar, sweets, soda, rice, bread, and pasta. - Reschedule clinical pharmacy appointment to one month from now. - Monitor for hypoglycemia and report if blood sugar consistently falls below 70 mg/dL. Keep upcoming appointment with opthamologist        Relevant Medications   insulin  glargine (LANTUS  SOLOSTAR) 100 UNIT/ML Solostar Pen   Other Relevant Orders   POCT glycosylated hemoglobin (Hb A1C) (Completed)     Musculoskeletal and Integument   Intertrigo   She likes to alternate nystatin  powder and ointment, prefers to use the powder when going out and appointment when at home Medications refilled       Relevant Medications   nystatin  ointment (MYCOSTATIN )   nystatin   (MYCOSTATIN /NYSTOP ) powder     Genitourinary   CKD stage G4/A2, GFR 15-29 and albumin creatinine ratio 30-300 mg/g [facility])    Assessment & Plan Lab Results  Component Value Date   NA 144 10/04/2023   K 5.0 10/04/2023   CO2 21 10/04/2023   GLUCOSE 92 10/04/2023   BUN 34 (H) 10/04/2023   CREATININE 2.15 (H) 10/04/2023   CALCIUM  9.4 10/04/2023   GFR 63.19 11/07/2014   EGFR 24 (L) 10/04/2023   GFRNONAA 36 (L) 04/07/2022   Avoid NSAIDS and other nephrotoxic agents  Chronic kidney disease under monitoring. Nephrology appointment scheduled. - Attend nephrology appointment at [facility].             Other   Screening for colon cancer   Relevant Orders   Ambulatory referral to Gastroenterology   Dyslipidemia, goal LDL below 70   Lab Results  Component Value Date   CHOL 156 06/26/2019   HDL 78 06/26/2019   LDLCALC 68 06/26/2019   LDLDIRECT 33 04/16/2023   TRIG 43 06/26/2019   CHOLHDL 2.0 06/26/2019  Controlled on atorvastatin  20 mg daily Recheck labs at next visit      Acute hip pain, left - Primary    New onset right hip pain, possibly due to arthritis. Previous x-ray showed arthritis. Pain affects mobility. - Order x-ray of the right hip to assess for arthritis. - Prescribe lidocaine  patch for pain management. - Advise use of acetaminophen  650 mg every six hours as needed for pain. - Avoid NSAIDs like Aleve  and ibuprofen  due to potential kidney harm.       Relevant Medications   lidocaine  (LIDODERM ) 5 %   Other Relevant Orders   DG Hip Unilat W OR W/O Pelvis 2-3 Views Left   Health care maintenance    Flu vaccination due. Colon cancer screening pending; colonoscopy preferred. Diabetic eye exam scheduled. - Administer flu vaccine for the current season. - Refer for colonoscopy for colon cancer screening. - Attend diabetic eye exam appointment and ensure results are sent to the primary care provider.        Other Visit Diagnoses       Need  for influenza vaccination       Relevant Orders   Flu vaccine HIGH DOSE PF(Fluzone Trivalent) (Completed)       Meds ordered this encounter  Medications   insulin  glargine (LANTUS  SOLOSTAR) 100 UNIT/ML Solostar Pen    Sig: Inject 24 Units into the skin daily.    Dispense:  45 mL    Refill:  1   lidocaine  (LIDODERM )  5 %    Sig: Place 1 patch onto the skin daily. Remove & Discard patch within 12 hours or as directed by MD    Dispense:  30 patch    Refill:  0   nystatin  ointment (MYCOSTATIN )    Sig: Apply 1 Application topically 2 (two) times daily.    Dispense:  30 g    Refill:  0   nystatin  (MYCOSTATIN /NYSTOP ) powder    Sig: APPLY POWDER TOPICALLY TO AFFECTED AREA 4 TIMES DAILY    Dispense:  15 g    Refill:  0    Follow-up: Return in about 3 months (around 02/28/2024) for CPE.    Hanin Decook R Caelan Atchley, FNP

## 2023-11-30 ENCOUNTER — Telehealth: Payer: Self-pay

## 2023-11-30 ENCOUNTER — Telehealth: Payer: Self-pay | Admitting: Nurse Practitioner

## 2023-11-30 ENCOUNTER — Ambulatory Visit: Payer: Self-pay

## 2023-11-30 ENCOUNTER — Other Ambulatory Visit: Payer: Self-pay

## 2023-11-30 NOTE — Telephone Encounter (Signed)
 Copied from CRM [id number]. Topic: Clinical - Order For Equipment >> Nov 30, 2023  9:13 AM Selinda RAMAN wrote: Reason for CRM: Macdonald Coe the practice administrator  for [facility] is going to refax over a form for Dr Jegede to sign concerning therapeutic shoes for the patient. This needs to be filled out and sent back as soon as possible and it was sent previously faxed on February 13th per notes but is now expired. Please assist patient further

## 2023-11-30 NOTE — Telephone Encounter (Signed)
 Noted of patient of this office

## 2023-11-30 NOTE — Telephone Encounter (Signed)
 Copied from CRM [id number]. Topic: General - Other >> Nov 30, 2023  4:17 PM Nathanel BROCKS wrote: Reason for CRM: EZ from [facility] called to see if we could refax over the information for this patient. She other didn't come through because of issue with there fax machine. Order for diabetic shoes.  Fax [phone]

## 2023-12-01 ENCOUNTER — Telehealth: Payer: Self-pay

## 2023-12-01 ENCOUNTER — Other Ambulatory Visit: Payer: Self-pay | Admitting: Family Medicine

## 2023-12-01 DIAGNOSIS — E1165 Type 2 diabetes mellitus with hyperglycemia: Secondary | ICD-10-CM

## 2023-12-01 NOTE — Telephone Encounter (Signed)
 Copied from CRM #[id number]. Topic: General - Other >> Dec 01, 2023  8:37 AM Everette C wrote: Reason for CRM: Evie with [facility] has called to request the resubmission of paperwork received for the patient yesterday 11/30/23 via fax. Please resubmit the order for diabetic shoes to their facility via fax at [phone]   Please contact Evie at [phone] if needed further

## 2023-12-01 NOTE — Telephone Encounter (Signed)
 Not a patient at this practice

## 2023-12-05 ENCOUNTER — Encounter (HOSPITAL_COMMUNITY): Payer: Self-pay

## 2023-12-05 ENCOUNTER — Ambulatory Visit (HOSPITAL_COMMUNITY)
Admission: EM | Admit: 2023-12-05 | Discharge: 2023-12-05 | Disposition: A | Discharge status: Home / Self Care | Attending: Internal Medicine | Admitting: Internal Medicine

## 2023-12-05 DIAGNOSIS — R051 Acute cough: Secondary | ICD-10-CM

## 2023-12-05 DIAGNOSIS — U071 COVID-19: Secondary | ICD-10-CM

## 2023-12-05 DIAGNOSIS — N184 Chronic kidney disease, stage 4 (severe): Secondary | ICD-10-CM

## 2023-12-05 DIAGNOSIS — R0981 Nasal congestion: Secondary | ICD-10-CM | POA: Diagnosis not present

## 2023-12-05 DIAGNOSIS — R52 Pain, unspecified: Secondary | ICD-10-CM | POA: Diagnosis not present

## 2023-12-05 LAB — POC COVID19/FLU A&B COMBO
Covid Antigen, POC: POSITIVE — AB
Influenza A Antigen, POC: NEGATIVE
Influenza B Antigen, POC: NEGATIVE

## 2023-12-05 MED ORDER — PREDNISONE 20 MG PO TABS: 20.0000 mg | ORAL_TABLET | Freq: Every day | ORAL | 0 refills | Status: AC

## 2023-12-05 MED ORDER — PROMETHAZINE-DM 6.25-15 MG/5ML PO SYRP: 5.0000 mL | ORAL_SOLUTION | Freq: Three times a day (TID) | ORAL | 0 refills | Status: DC | PRN

## 2023-12-05 NOTE — ED Triage Notes (Signed)
 Patient here today with c/o body aches, sneezing, nasal congestion, headache, right ear pain, cough, and chills since Wednesday after getting a flu vaccine. Patient has taken Theraflu with no relief. No known sick contacts.

## 2023-12-05 NOTE — Discharge Instructions (Addendum)
 Flu A and B are negative.  COVID is positive.  This is a virus and does not require antibiotic treatment.  Unfortunately Paxlovid will not be an option as your kidney function is slightly reduced.  We can try to help improve the symptoms with medication for cough and to help with inflammation.  However we will treat with the following: Promethazine  DM 5 mL every 8 hours as needed for cough.  Use caution as this medication can cause drowsiness. Prednisone  20 mg once daily for 3 days. Take this in the morning.  This is a steroid to help with inflammation and pain.  Continue to take tylenol  for pain/fevers Make sure to stay hydrated by drinking plenty of water .  If no improvement in 4-5 days, then return to clinic If symptoms worsens, then return sooner.  Recommend using a mask if you have to leave the house for the next 4-5 days.

## 2023-12-05 NOTE — ED Provider Notes (Signed)
 [facility]    CSN: [id number] Arrival date & time: 12/05/23  1003      History   Chief Complaint Chief Complaint  Patient presents with   Generalized Body Aches    HPI Rachel Gonzalez is Gonzalez 73 y.o. female.   73 y.o. female who presents to urgent care with complaints of cough, body aches, headache, nasal congestion, right ear pain, sneezing since Wednesday night.  She reports that Wednesday she got the flu vaccine and started having symptoms at night.  She has been taking TheraFlu and Tylenol  with and without much relief.  She reports that she is also having some diarrhea, nausea and mild vomiting but is able to stay hydrated.  She denies any fevers, abdominal pain, loss of hearing,  known sick exposures.     Past Medical History:  Diagnosis Date   Anemia    Arthritis    right finger   CHF (congestive heart failure) (HCC)    Chronic renal disease, stage 3, moderately decreased glomerular filtration rate (GFR) between 30-59 mL/min/1.73 square meter (HCC)    Diabetes mellitus    GERD (gastroesophageal reflux disease)    Hypertension     Patient Active Problem List   Diagnosis Date Noted   Acute hip pain, left 11/29/2023   Intertrigo 11/29/2023   Health care maintenance 11/29/2023   Diabetic polyneuropathy associated with type 2 diabetes mellitus (HCC) 05/17/2023   Elevated alkaline phosphatase level 04/19/2023   Dyslipidemia, goal LDL below 70 04/16/2023   Plantar flexed metatarsal bone of left foot 01/18/2023   Capsulitis 01/18/2023   Pain due to onychomycosis of toenails of both feet 06/17/2022   Acute metabolic encephalopathy 04/11/2020   Acute lower UTI 04/11/2020   DKA (diabetic ketoacidosis) (HCC) 04/10/2020   Altered mental status 04/10/2020   CAP (community acquired pneumonia) 04/10/2020   Pyuria 04/10/2020   CKD stage G4/A2, GFR 15-29 and albumin creatinine ratio 30-300 mg/g (HCC) 12/01/2016   Frequent falls 04/28/2016   Right arm weakness  04/28/2016   Bilateral lower extremity edema 04/28/2016   Screening for colon cancer 04/28/2016   Abnormal urinalysis 09/13/2014   Chest pain 09/13/2014   Abdominal pain, recurrent 09/13/2014   Dehydration with hyponatremia 09/13/2014   Hyperkalemia 09/13/2014   Acute renal failure superimposed on stage 3 chronic kidney disease (HCC) 09/13/2014   Anemia 05/06/2014   Diabetic neuropathy, type II diabetes mellitus (HCC) 05/06/2014   Obesity (BMI 30-39.9) 05/06/2014   Cellulitis of right foot 05/06/2014   Essential hypertension 03/07/2011   PERS HX NONCOMPLIANCE W/MED TX PRS HAZARDS HLTH 02/07/2009   Right arm pain 08/04/2006   Osteoarthritis 02/14/2006   Uncontrolled type 2 diabetes mellitus with hyperglycemia (HCC) 01/11/2006    Past Surgical History:  Procedure Laterality Date   AMPUTATION Right 05/08/2014   Procedure: AMPUTATION RAY, right great toe;  Surgeon: Jerona Harden GAILS, MD;  Location: [facility] OR;  Service: Orthopedics;  Laterality: Right;   arm surgery      CESAREAN SECTION     COLONOSCOPY     I & D EXTREMITY Right 05/08/2014   Procedure: IRRIGATION AND DEBRIDEMENT FOOT;  Surgeon: Jerona Harden GAILS, MD;  Location: [facility] OR;  Service: Orthopedics;  Laterality: Right;   MEMBRANE PEEL Right 03/15/2018   Procedure: MEMBRANE PEEL;  Surgeon: Tobie Baptist, MD;  Location: [facility] OR;  Service: Ophthalmology;  Laterality: Right;   PHOTOCOAGULATION WITH LASER Right 03/15/2018   Procedure: PHOTOCOAGULATION WITH LASER;  Surgeon: Tobie Baptist, MD;  Location:  [facility] OR;  Service: Ophthalmology;  Laterality: Right;   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 Authorizing Provider  promethazine -dextromethorphan (PROMETHAZINE -DM) 6.25-15 MG/5ML syrup Take 5 mLs by mouth every 8 (eight) hours as needed for cough. 12/05/23  Yes Rachel Koslow Gonzalez, Rachel Gonzalez  acetaminophen  (TYLENOL ) 325 MG tablet Take 2 tablets (650 mg total) by mouth every 6 (six) hours as needed. 11/21/21   Elnor Jayson LABOR, DO  amLODipine  (NORVASC ) 10 MG tablet TAKE 1 TABLET EVERY DAY 06/06/23   Nichols, Tonya S, NP  atorvastatin  (LIPITOR) 20 MG tablet TAKE 1 TABLET EVERY DAY 06/06/23   Nichols, Tonya S, NP  Blood Glucose Monitoring Suppl DEVI 1 each by Does not apply route in the morning, at noon, and at bedtime. May substitute to any manufacturer covered by patient's insurance. 07/19/23   [name], [name], FNP  brimonidine (ALPHAGAN) 0.2 % ophthalmic solution Place 1 drop into the right eye 2 (two) times daily. 07/13/23   [provider]  EQ ALLERGY RELIEF, CETIRIZINE , 10 MG tablet Take 1 tablet by mouth once daily Patient not taking: Reported on 11/29/2023 08/12/22   Tilford Bertram HERO, FNP  furosemide  (LASIX ) 20 MG tablet TAKE 1 TABLET EVERY DAY 03/04/23   Nichols, Tonya S, NP  gabapentin  (NEURONTIN ) 300 MG capsule Take 1 capsule (300 mg total) by mouth 2 (two) times daily. 05/17/23   [name], [name], FNP  Glucose Blood (BLOOD GLUCOSE TEST STRIPS) STRP USE 1 IN THE MORNING, AT NOON, AND AT BEDTIME. 09/23/23   [name], [name], FNP  LANTUS  SOLOSTAR 100 UNIT/ML Solostar Pen INJECT 25 UNITS INTO THE SKIN DAILY. 12/01/23   Oley Bascom RAMAN, NP  lidocaine  (LIDODERM ) 5 % Place 1 patch onto the skin daily. Remove & Discard patch within 12 hours or as directed by MD 11/29/23   [name], [name], FNP  nystatin  (MYCOSTATIN /NYSTOP ) powder APPLY POWDER TOPICALLY TO AFFECTED AREA 4 TIMES DAILY 11/29/23   [name], [name], FNP  nystatin   ointment (MYCOSTATIN ) Apply 1 Application topically 2 (two) times daily. 11/29/23   [name], [name], FNP  omeprazole  (PRILOSEC) 20 MG capsule Take 1 capsule by mouth once daily 10/15/23   [name], [name], FNP  predniSONE  (DELTASONE ) 20 MG tablet Take 1 tablet (20 mg total) by mouth daily with breakfast for 3 days. 12/05/23 12/08/23 Yes Rachel Badman Gonzalez, Rachel Gonzalez  RELION PEN NEEDLE 31G/8MM 31G X 8 MM MISC USE AS DIRECTED 4 TIMES DAILY 12/20/17   Vicenta Maduro, FNP  Semaglutide , 2 MG/DOSE, 8 MG/3ML SOPN Inject 2 mg as directed once Gonzalez week. 07/19/23   [name], [name], FNP  timolol (TIMOPTIC) 0.5 % ophthalmic solution Place 1 drop into the right eye 2 (two)  times daily. 06/28/23   [provider]  triamcinolone  ointment (KENALOG ) 0.5 % Apply 1 Application topically 2 (two) times daily. 07/06/23   [name], [name], FNP  TRUEplus Lancets 33G MISC CHECK BLOOD SUGAR IN THE MORNING, AT NOON, AND AT BEDTIME. 11/15/23   [name], [name], FNP  valsartan  (DIOVAN ) 40 MG tablet Take 1 tablet (40 mg total) by mouth daily. Patient not taking: Reported on 11/29/2023 10/05/23   [name], [name], FNP    Family History Family History  Problem Relation Age of Onset   Diabetes Mother    Breast cancer Neg Hx     Social History Social History   Tobacco Use   Smoking status: Never   Smokeless tobacco: Never  Vaping Use   Vaping status: Never Used  Substance Use Topics   Alcohol use: No   Drug use: No     Allergies   Tramadol, Adhesive [tape], Bactrim  [sulfamethoxazole -trimethoprim ], and Tylenol  with codeine  #3 [acetaminophen -codeine ]   Review of Systems Review of Systems  Constitutional:  Positive for appetite change, chills and fatigue. Negative for fever.  HENT:  Positive for congestion, ear pain (right) and sneezing. Negative for sore throat.   Eyes:  Negative for pain and visual disturbance.  Respiratory:  Positive for cough. Negative for shortness of breath.   Cardiovascular:   Negative for chest pain and palpitations.  Gastrointestinal:  Positive for diarrhea. Negative for abdominal pain and vomiting.  Genitourinary:  Negative for dysuria and hematuria.  Musculoskeletal:  Positive for myalgias. Negative for arthralgias and back pain.  Skin:  Negative for color change and rash.  Neurological:  Positive for headaches. Negative for seizures and syncope.  All other systems reviewed and are negative.    Physical Exam Triage Vital Signs ED Triage Vitals  Encounter Vitals Group     BP 12/05/23 1023 133/84     Girls Systolic BP Percentile --      Girls Diastolic BP Percentile --      Boys Systolic BP Percentile --      Boys Diastolic BP Percentile --      Pulse Rate 12/05/23 1023 80     Resp 12/05/23 1023 16     Temp 12/05/23 1023 98.1 F (36.7 C)     Temp Source 12/05/23 1023 Oral     SpO2 12/05/23 1023 94 %     Weight --      Height --      Head Circumference --      Peak Flow --      Pain Score 12/05/23 1024 9     Pain Loc --      Pain Education --      Exclude from Growth Chart --    No data found.  Updated Vital Signs BP 133/84 (BP Location: Right Arm)   Pulse 80   Temp 98.1 F (36.7 C) (Oral)   Resp 16   SpO2 94%   Visual Acuity Right Eye Distance:   Left Eye Distance:   Bilateral Distance:    Right Eye Near:   Left Eye Near:    Bilateral Near:     Physical Exam Vitals and nursing note reviewed.  Constitutional:      General: She is not in acute distress.    Appearance: She is well-developed.  HENT:     Head: Normocephalic and atraumatic.     Right Ear: Tympanic membrane normal.     Left Ear: Tympanic membrane normal.  Nose: Congestion present.     Mouth/Throat:     Mouth: Mucous membranes are moist.     Pharynx: No posterior oropharyngeal erythema.  Eyes:     Conjunctiva/sclera: Conjunctivae normal.  Cardiovascular:     Rate and Rhythm: Normal rate and regular rhythm.     Heart sounds: No murmur heard. Pulmonary:      Effort: Pulmonary effort is normal. No tachypnea or respiratory distress.     Breath sounds: Normal breath sounds. No decreased breath sounds or wheezing.  Abdominal:     General: Bowel sounds are normal.     Palpations: Abdomen is soft.     Tenderness: There is no abdominal tenderness.  Musculoskeletal:        General: No swelling.     Cervical back: Neck supple.  Skin:    General: Skin is warm and dry.     Capillary Refill: Capillary refill takes less than 2 seconds.  Neurological:     General: No focal deficit present.     Mental Status: She is alert.     Coordination: Coordination normal.     Gait: Gait normal.  Psychiatric:        Mood and Affect: Mood normal.        Behavior: Behavior normal.        Thought Content: Thought content normal.        Judgment: Judgment normal.      UC Treatments / Results  Labs (all labs ordered are listed, but only abnormal results are displayed) Labs Reviewed  POC COVID19/FLU Gonzalez&B COMBO - Abnormal; Notable for the following components:      Result Value   Covid Antigen, POC Positive (*)    All other components within normal limits    EKG   Radiology No results found.  Procedures Procedures (including critical care time)  Medications Ordered in UC Medications - No data to display  Initial Impression / Assessment and Plan / UC Course  I have reviewed the triage vital signs and the nursing notes.  Pertinent labs & imaging results that were available during my care of the patient were reviewed by me and considered in my medical decision making (see chart for details).     Acute cough - Plan: POC Covid19/Flu Gonzalez&B Antigen, POC Covid19/Flu Gonzalez&B Antigen  Nasal congestion - Plan: POC Covid19/Flu Gonzalez&B Antigen, POC Covid19/Flu Gonzalez&B Antigen  Body aches - Plan: POC Covid19/Flu Gonzalez&B Antigen, POC Covid19/Flu Gonzalez&B Antigen  COVID-19  CKD stage G4/A2, GFR 15-29 and albumin creatinine ratio 30-300 mg/g (HCC)   Flu Gonzalez and B are negative.   COVID is positive.  This is Gonzalez virus and does not require antibiotic treatment.  Unfortunately Paxlovid will not be an option as your kidney function is slightly reduced.  We can try to help improve the symptoms with medication for cough and to help with inflammation.  However we will treat with the following: Promethazine  DM 5 mL every 8 hours as needed for cough.  Use caution as this medication can cause drowsiness. Prednisone  20 mg once daily for 3 days. Take this in the morning.  This is Gonzalez steroid to help with inflammation and pain.  Continue to take tylenol  for pain/fevers Make sure to stay hydrated by drinking plenty of water .  If no improvement in 4-5 days, then return to clinic If symptoms worsens, then return sooner.  Recommend using Gonzalez mask if you have to leave the house for the next 4-5 days.  Final Clinical Impressions(s) / UC Diagnoses   Final diagnoses:  Acute cough  Nasal congestion  Body aches  COVID-19  CKD stage G4/A2, GFR 15-29 and albumin creatinine ratio 30-300 mg/g (HCC)     Discharge Instructions      Flu Gonzalez and B are negative.  COVID is positive.  This is Gonzalez virus and does not require antibiotic treatment.  Unfortunately Paxlovid will not be an option as your kidney function is slightly reduced.  We can try to help improve the symptoms with medication for cough and to help with inflammation.  However we will treat with the following: Promethazine  DM 5 mL every 8 hours as needed for cough.  Use caution as this medication can cause drowsiness. Prednisone  20 mg once daily for 3 days. Take this in the morning.  This is Gonzalez steroid to help with inflammation and pain.  Continue to take tylenol  for pain/fevers Make sure to stay hydrated by drinking plenty of water .  If no improvement in 4-5 days, then return to clinic If symptoms worsens, then return sooner.  Recommend using Gonzalez mask if you have to leave the house for the next 4-5 days.      ED Prescriptions      Medication Sig Dispense Auth. Provider   predniSONE  (DELTASONE ) 20 MG tablet Take 1 tablet (20 mg total) by mouth daily with breakfast for 3 days. 3 tablet Rachel Awwad Gonzalez, Rachel Gonzalez   promethazine -dextromethorphan (PROMETHAZINE -DM) 6.25-15 MG/5ML syrup Take 5 mLs by mouth every 8 (eight) hours as needed for cough. 180 mL Rachel Gonzalez, Rachel Gonzalez      PDMP not reviewed this encounter.   Rachel Almarie LABOR, Rachel Gonzalez 12/05/23 1144

## 2023-12-06 ENCOUNTER — Ambulatory Visit: Admitting: Podiatry

## 2023-12-10 ENCOUNTER — Ambulatory Visit: Payer: Self-pay | Admitting: *Deleted

## 2023-12-10 NOTE — Telephone Encounter (Signed)
 12/10/2023 - Patient Calls: Merlynn Jon RAMAN, RN (Newest Message First)            View All Conversations on this Encounter Merlynn Jon RAMAN, RN to [employer]  (Selected Message)     12/10/23 12:09 PM FYI- recommended UC for worsening sx and patient wants to see PCP. See NT encounter please advise and pt wants call back.   12/10/23 11:31 AM Vicci Niels NOVAK contacted Merlynn Jon RAMAN, RN Merlynn Jon RAMAN, RN    12/10/23 11:31 AM Note FYI Only or Action Required?: Action required by provider: requesting medication .   Patient was last seen in primary care on 11/29/2023 by [name], [name], FNP.   Called Nurse Triage reporting Otalgia.   Symptoms began a week ago.   Interventions attempted: OTC medications: tylenol  not effective, Rest, hydration, or home remedies, and Ice/heat application.   Symptoms are: unchanged.   Triage Disposition: See HCP Within 4 Hours (Or PCP Triage)   Patient/caregiver understands and will follow disposition?: No, wishes to speak with PCP     Recommended UC due to no available appt until 01/04/24. Patient does not want to go to UC and requesting call back. Please advise.

## 2023-12-10 NOTE — Progress Notes (Signed)
 Rachel Gonzalez                                          MRN: [id number]   12/10/2023   The [facility] Quality Team Specialist reviewed this patient medical record for the purposes of chart review for care gap closure. The following were reviewed: chart review for care gap closure-kidney health evaluation for diabetes:eGFR  and uACR.    [facility] Quality Team

## 2023-12-10 NOTE — Telephone Encounter (Signed)
 FYI Only or Action Required?: Action required by provider: requesting medication .  Patient was last seen in primary care on 11/29/2023 by [name], [name], FNP.  Called Nurse Triage reporting Otalgia.  Symptoms began a week ago.  Interventions attempted: OTC medications: tylenol  not effective, Rest, hydration, or home remedies, and Ice/heat application.  Symptoms are: unchanged.  Triage Disposition: See HCP Within 4 Hours (Or PCP Triage)  Patient/caregiver understands and will follow disposition?: No, wishes to speak with PCP   Recommended UC due to no available appt until 01/04/24. Patient does not want to go to UC and requesting call back. Please advise.            Copied from CRM [id number]. Topic: Clinical - Red Word Triage >> Dec 10, 2023 11:29 AM Antwanette L wrote: Red Word that prompted transfer to Nurse Triage: The patient is experiencing pain in the right ear and is requesting medication for relief. Reason for Disposition  [1] SEVERE pain (e.g., excruciating) and [2] not improved 2 hours after pain medicine (e.g., acetaminophen  or ibuprofen )  Answer Assessment - Initial Assessment Questions Requesting medication for right ear pain. Started 12/02/23 , went to UC and dx positive covid. Ear pain persists and dizziness at times. Patient requesting call back and if medication can be prescribed. No available appt until 01/04/24. No mobile  bus today recommended UC if pain worsens. Please call patient back      1. LOCATION: Which ear is involved?     Right ear  2. ONSET: When did the ear pain start?      Thursday 12/02/23 3. SEVERITY: How bad is the pain?  (Scale 1-10; mild, moderate or severe)     Severe when sleeping and wakes up from sleep 4. URI SYMPTOMS: Do you have a runny nose or cough?     Runny nose and cough from covid  5. FEVER: Do you have a fever? If Yes, ask: What is your temperature, how was it measured, and when did it start?     na 6.  CAUSE: Have you been swimming recently?, How often do you use Q-TIPS?, Have you had any recent air travel or scuba diving?     No  7. OTHER SYMPTOMS: Do you have any other symptoms? (e.g., decreased hearing, dizziness, headache, stiff neck, vomiting)     Dizziness at times standing , right ear pain throbbing ,tylenol  not effective. 8. PREGNANCY: Is there any chance you are pregnant? When was your last menstrual period?     na  Protocols used: [place]illa

## 2023-12-15 ENCOUNTER — Ambulatory Visit

## 2023-12-15 DIAGNOSIS — M216X2 Other acquired deformities of left foot: Secondary | ICD-10-CM

## 2023-12-15 DIAGNOSIS — L84 Corns and callosities: Secondary | ICD-10-CM | POA: Diagnosis not present

## 2023-12-15 DIAGNOSIS — M2141 Flat foot [pes planus] (acquired), right foot: Secondary | ICD-10-CM

## 2023-12-15 DIAGNOSIS — M2142 Flat foot [pes planus] (acquired), left foot: Secondary | ICD-10-CM

## 2023-12-15 DIAGNOSIS — E1142 Type 2 diabetes mellitus with diabetic polyneuropathy: Secondary | ICD-10-CM

## 2023-12-16 ENCOUNTER — Other Ambulatory Visit

## 2023-12-16 NOTE — Progress Notes (Signed)
 Rachel Gonzalez                                          MRN: [id number]   12/16/2023   The [facility] Quality Team Specialist reviewed this patient medical record for the purposes of chart review for care gap closure. The following were reviewed: chart review for care gap closure-glycemic status assessment.    [facility] Quality Team

## 2023-12-17 ENCOUNTER — Other Ambulatory Visit: Payer: Self-pay | Admitting: Nurse Practitioner

## 2023-12-17 DIAGNOSIS — I10 Essential (primary) hypertension: Secondary | ICD-10-CM

## 2023-12-17 DIAGNOSIS — R600 Localized edema: Secondary | ICD-10-CM

## 2023-12-29 ENCOUNTER — Other Ambulatory Visit (INDEPENDENT_AMBULATORY_CARE_PROVIDER_SITE_OTHER): Payer: Self-pay

## 2023-12-29 ENCOUNTER — Other Ambulatory Visit (HOSPITAL_COMMUNITY): Payer: Self-pay

## 2023-12-29 DIAGNOSIS — N184 Chronic kidney disease, stage 4 (severe): Secondary | ICD-10-CM

## 2023-12-29 DIAGNOSIS — E1165 Type 2 diabetes mellitus with hyperglycemia: Secondary | ICD-10-CM

## 2023-12-29 NOTE — Progress Notes (Signed)
 12/29/2023 Name: Rachel [place] MRN: [id number] DOB: Feb 10, 1951  Chief Complaint  Patient presents with   Diabetes   Hypertension    Rachel [place] is a 73 y.o. year old female who presented for a telephone visit.   They were referred to the pharmacist by their PCP for assistance in managing diabetes. PMH includes HTN, T2DM with polyneuropathy, CKD3 (eGFR 28 mL/min), BMI > 30, HLD, frequent falls.   Subjective: Patient was seen by PCP, Lorice Shall, NP, on 07/19/23. At last visit, BP was elevated to 147/61 mmHg, HR 73. She reported that she had recently ran out of Ozempic  for several weeks but has been able to restart the medication. She was instructed to increase to Ozempic  2 mg at next fill. She reported that she is currently taking car of her husband who has cancer and is undergoing treatment. Patient has had elevated potassium 5.3 mEq/L and 5.4 mEq/L on last two BMP, in the setting of eGFR < 30 mL/min. She was instructed to take Lokelma  after hyperkalemia did not resolve. She did not return for repeat labs the following week.  Patient was first engaged by pharmacy on 09/29/23, she was instructed to decrease insulin  glargine (Lantus ) to 20 units daily due to hypoglycemia. She also was instructed to return to [facility] for a BP check and f/u labs. On 10/04/23, she her BP was 126/50 mmHg in-office. On BMP, her potassium had stabilized but her Scr continued to rise. She was instructed to hold valsartan  80 mg, and restart at 40 mg daily when it arrived from her mail order pharmacy. She was also instructed to schedule an appt with nephrology. At pharmacy telephone call on 10/08/23. No changes were made to her medications as she had not had repeat labs yet. She saw PCP on 11/29/23, but it was an acute visit and BMP was not drawn. BP was still elevated at 142/59 mmHg. A1C improved to 9.2% from 11.0%. Her Lantus  was increased to 24 units daily.  Today, patient reports doing ok. Had COVID a couple weeks ago, and  feels like she is still recovering. Had to reschedule her nephrology appt, but it is now scheduled for 01/17/24. She reports she has not been taking valsartan  because she was not aware to restart it. She still does not have a blood pressure cuff to monitor her blood pressure with at home.   Care Team: Primary Care Provider: [name], [name], FNP ; Next Scheduled Visit: 02/29/24 Nephrology: 01/17/24  Medication Access/Adherence  Current Pharmacy:  [facility] - [place], [name] - [address] [name] [address] Phone: [phone] Fax: [phone]  [facility] - [place], [place] - [name] [place] [address] Phone: [phone] Fax: [phone]   Patient reports affordability concerns with their medications: No  Patient reports access/transportation concerns to their pharmacy: No  Patient reports adherence concerns with their medications:  Yes  - not taking valsartan  40 mg daily as prescribed due to regimen confusion  Freestyle Libre 3+: $29.13 per 30ds - patient reports this was too costly for her   Diabetes:  Current medications: Ozempic  2 mg weekly (Fridays), Lantus  24 units daily  Using Truemetrix (?) meter Current glucose readings: checking once daily in the morning (FBG) > Has not checked in ~3 weeks (since she was sick with COVID)  Reports that FL3+ did not last long enough in the past, kept falling off early. Also had  issues affording it.   Patient reports hypoglycemic s/sx including dizziness, shakiness, sweating - 1x over the past few weeks, resolved after eating a banana. Did not check BG at that time.  Patient denies hyperglycemic symptoms including polyuria, polydipsia, polyphagia, nocturia, neuropathy, blurred vision.  Current meal patterns: Eats 3 meals per day + 1 snack. Endorses having protein with each meal.  - Drinks: water  throughout the day,  infrequent soda  Current physical activity: no purposeful activity  Hypertension:  Current medications: valsartan  40 mg daily (says she has not been taking this), amlodipine  10 mg daily, furosemide  20 mg daily Medications previously tried: valsartan  80 mg (elevated K and Scr)  Patient does not have a validated, automated, upper arm home BP cuff Current blood pressure readings readings: N/A  Patient denies hypotensive s/sx including dizziness, lightheadedness.  Patient denies hypertensive symptoms including headache, chest pain, shortness of breath  Patient denies LEE.  Hyperlipidemia/ASCVD Risk Reduction  Current lipid lowering medications: atorvastatin  20 mg daily   Clinical ASCVD: No  The ASCVD Risk score (Arnett DK, et al., 2019) failed to calculate for the following reasons:   Cannot find a previous HDL lab   Cannot find a previous total cholesterol lab    Objective:  BP Readings from Last 3 Encounters:  12/05/23 133/84  11/29/23 (!) 142/59  10/05/23 (!) 126/50    Lab Results  Component Value Date   HGBA1C 9.2 (A) 11/29/2023   HGBA1C 11.0 (A) 07/19/2023   HGBA1C 8.2 (A) 04/16/2023       Latest Ref Rng & Units 10/04/2023    7:59 AM 07/26/2023    2:55 PM 07/19/2023   10:27 AM  BMP  Glucose 70 - 99 mg/dL 92  760  846   BUN 8 - 27 mg/dL 34  37  34   Creatinine 0.57 - 1.00 mg/dL 7.84  8.09  7.92   BUN/Creat Ratio 12 - 28 16  19  16    Sodium 134 - 144 mmol/L 144  140  142   Potassium 3.5 - 5.2 mmol/L 5.0  5.4  5.3   Chloride 96 - 106 mmol/L 109  105  106   CO2 20 - 29 mmol/L 21  22  22    Calcium  8.7 - 10.3 mg/dL 9.4  9.1  9.4     Lab Results  Component Value Date   CHOL 156 06/26/2019   HDL 78 06/26/2019   LDLCALC 68 06/26/2019   LDLDIRECT 33 04/16/2023   TRIG 43 06/26/2019   CHOLHDL 2.0 06/26/2019    Medications Reviewed Today     Reviewed by Brinda Lorain SQUIBB, RPH (Pharmacist) on 12/29/23 at 1639  Med List Status: <None>   Medication Order Taking? Sig  Documenting Provider Last Dose Status Informant  acetaminophen  (TYLENOL ) 325 MG tablet [id number]  Take 2 tablets (650 mg total) by mouth every 6 (six) hours as needed. Elnor Jayson LABOR, DO  Active            Med Note SAMULE, [facility]   Tue Apr 07, 2022 10:10 AM) Prn   amLODipine  (NORVASC ) 10 MG tablet [id number] Yes TAKE 1 TABLET EVERY DAY Nichols, Tonya S, NP  Active   atorvastatin  (LIPITOR) 20 MG tablet [id number] Yes TAKE 1 TABLET EVERY DAY Nichols, Tonya S, NP  Active   Blood Glucose Monitoring Suppl DEVI [id number]  1 each by Does not apply route in the morning, at noon, and at bedtime. May substitute to any manufacturer covered by patient's insurance. [name],  [name], FNP  Active   brimonidine (ALPHAGAN) 0.2 % ophthalmic solution [id number]  Place 1 drop into the right eye 2 (two) times daily. [provider]  Active   EQ ALLERGY RELIEF, CETIRIZINE , 10 MG tablet [id number]  Take 1 tablet by mouth once daily  Patient not taking: Reported on 11/29/2023   Hollis, Lachina M, FNP  Active   furosemide  (LASIX ) 20 MG tablet [id number] Yes TAKE 1 TABLET EVERY DAY Nichols, Tonya S, NP  Active   gabapentin  (NEURONTIN ) 300 MG capsule [id number]  Take 1 capsule (300 mg total) by mouth 2 (two) times daily. [name], [name], FNP  Active   Glucose Blood (BLOOD GLUCOSE TEST STRIPS) STRP [id number]  USE 1 IN THE MORNING, AT NOON, AND AT BEDTIME. [name], [name], FNP  Active   LANTUS  SOLOSTAR 100 UNIT/ML Solostar Pen [id number] Yes INJECT 25 UNITS INTO THE SKIN DAILY. Oley Bascom RAMAN, NP  Active   lidocaine  (LIDODERM ) 5 % [id number]  Place 1 patch onto the skin daily. Remove & Discard patch within 12 hours or as directed by MD [name], [name], FNP  Active   nystatin  (MYCOSTATIN /NYSTOP ) powder [id number]  APPLY POWDER TOPICALLY TO AFFECTED AREA 4 TIMES DAILY [name], [name], FNP  Active   nystatin  ointment (MYCOSTATIN ) [id number]  Apply 1 Application topically 2 (two) times daily. [name], Folashade  R, FNP  Active   omeprazole  (PRILOSEC) 20 MG capsule [id number] Yes Take 1 capsule by mouth once daily [name], [name], FNP  Active   promethazine -dextromethorphan (PROMETHAZINE -DM) 6.25-15 MG/5ML syrup [id number]  Take 5 mLs by mouth every 8 (eight) hours as needed for cough. Teresa Almarie DELENA DEVONNA  Active   RELION PEN NEEDLE 31G/8MM 31G X 8 MM MISC [id number]  USE AS DIRECTED 4 TIMES DAILY Vicenta Maduro, FNP  Active Self  Semaglutide , 2 MG/DOSE, 8 MG/3ML SOPN [id number] Yes Inject 2 mg as directed once a week. [name], [name], FNP  Active   timolol (TIMOPTIC) 0.5 % ophthalmic solution [id number]  Place 1 drop into the right eye 2 (two) times daily. [provider]  Active   triamcinolone  ointment (KENALOG ) 0.5 % [id number]  Apply 1 Application topically 2 (two) times daily. [name], [name], FNP  Active   TRUEplus Lancets 33G MISC [id number]  CHECK BLOOD SUGAR IN THE MORNING, AT NOON, AND AT BEDTIME. [name], [name], FNP  Active   valsartan  (DIOVAN ) 40 MG tablet [id number]  Take 1 tablet (40 mg total) by mouth daily.  Patient not taking: Reported on 12/29/2023   [name], [name], FNP  Active               Assessment/Plan:   Diabetes: - Currently uncontrolled with most recent A1C of 9.2% above goal <7%, but improved from 11%. Patient is tolerating Ozempic  2 mg well. Unable to assess efficacy of current basal insulin  dose due to lack of monitoring. Patient willing to restart checking daily FBG and occasional 2 hr PPG. Patient would likely benefit from addition of SGLT2i for CKD, but she has an upcoming appt with nephrology, so will defer to them for initiation. A1C is still > 9% so she would be at slightly higher risk for GU infection  - Last UACR 01/12/23: 141 mg/g - due now - Patient denies personal or family history of multiple endocrine neoplasia type 2, medullary thyroid cancer; personal history of pancreatitis or gallbladder disease. - Reviewed long term  cardiovascular and renal outcomes of uncontrolled blood sugar - Reviewed goal  A1c, goal fasting, and goal 2 hour post prandial glucose - Reviewed hypoglycemia management plan and the rule of 15 - Reviewed dietary modifications including  utilizing the healthy plate method, limiting portion size of carbohydrate foods, increasing intake of protein and non-starchy vegetables. Counseled patient to stay hydrated with water  throughout the day. - Reviewed lifestyle modifications including: aiming for 150 minutes of moderate intensity exercise every week.  - Recommend to continue Ozempic  2 mg weekly - Recommend to continue insulin  glargine (Lantus ) 24units daily  - Recommend to check glucose twice daily: fasting and 2-hr PPG . Counseled patient to bring glucometer or BG log to every appointment. Patient is not interested in retrial of CGM due to sensors not lasting long enough on her skin and cost. - Next A1C due 02/28/24   Hypertension: - Currently uncontrolled with clinic BP above goal less than 130/80. Since patient has upcoming nephrology appt, instructed her to resume valsartan  at 40 mg daily so she can have appropriate monitoring labs done in ~3 weeks. She has not taken any valsartan  since she was instructed to hold (80 mg dose) after K remained at 5.0 mEq/L on BMP in 10/04/23. Patient would benefit from low-dose ARB to prevent progression of CKD if she is able to tolerate, but close monitoring is needed.  - Reviewed long term cardiovascular and renal outcomes of uncontrolled blood pressure - Recommend to continue amlodipine  10 mg daily, furosemide  20 mg daily - Recommend to restart valsartan  at 40 mg daily - patient to have close follow-up with nephrology on 01/17/24. - Recommend to repeat BMP at nephrology appt on 01/17/24   Hyperlipidemia/ASCVD Risk Reduction: - Currently controlled with most recent LDL-C of 33 mg/dL below goal < 70 mg/dL given U7If + comorbidities. Moderate intensity statin  indicated. - Reviewed long term complications of uncontrolled cholesterol - Recommend to continue atorvastatin  20 mg daily   Patient verbalized understanding of treatment plan.    Follow Up Plan:  Pharmacist in-person 02/15/24 PCP clinic visit 02/29/24   Lorain Baseman, PharmD [facility] [phone]

## 2023-12-31 NOTE — Progress Notes (Signed)
 Patient presents today to pick up diabetic shoes and insoles.  Patient was dispensed 1 pair of diabetic shoes and 3 pairs of total contact diabetic insoles. Fit was satisfactory. Instructions for break-in and wear was reviewed and a copy was given to the patient.   Re-appointment for regularly scheduled diabetic foot care visits or if they should experience any trouble with the shoes or insoles.

## 2024-01-14 ENCOUNTER — Other Ambulatory Visit: Payer: Self-pay | Admitting: Nurse Practitioner

## 2024-01-14 DIAGNOSIS — K219 Gastro-esophageal reflux disease without esophagitis: Secondary | ICD-10-CM

## 2024-01-17 DIAGNOSIS — N184 Chronic kidney disease, stage 4 (severe): Secondary | ICD-10-CM | POA: Diagnosis not present

## 2024-01-17 DIAGNOSIS — I129 Hypertensive chronic kidney disease with stage 1 through stage 4 chronic kidney disease, or unspecified chronic kidney disease: Secondary | ICD-10-CM | POA: Diagnosis not present

## 2024-01-17 DIAGNOSIS — E785 Hyperlipidemia, unspecified: Secondary | ICD-10-CM | POA: Diagnosis not present

## 2024-01-17 DIAGNOSIS — N189 Chronic kidney disease, unspecified: Secondary | ICD-10-CM | POA: Diagnosis not present

## 2024-01-17 DIAGNOSIS — D631 Anemia in chronic kidney disease: Secondary | ICD-10-CM | POA: Diagnosis not present

## 2024-01-17 DIAGNOSIS — E1122 Type 2 diabetes mellitus with diabetic chronic kidney disease: Secondary | ICD-10-CM | POA: Diagnosis not present

## 2024-01-17 DIAGNOSIS — N2581 Secondary hyperparathyroidism of renal origin: Secondary | ICD-10-CM | POA: Diagnosis not present

## 2024-01-19 ENCOUNTER — Other Ambulatory Visit: Payer: Self-pay | Admitting: Nurse Practitioner

## 2024-01-19 DIAGNOSIS — E1142 Type 2 diabetes mellitus with diabetic polyneuropathy: Secondary | ICD-10-CM

## 2024-01-19 NOTE — Telephone Encounter (Signed)
 Please advise [facility]

## 2024-01-20 ENCOUNTER — Other Ambulatory Visit: Payer: Self-pay | Admitting: Nurse Practitioner

## 2024-01-20 DIAGNOSIS — L304 Erythema intertrigo: Secondary | ICD-10-CM

## 2024-01-21 ENCOUNTER — Encounter: Payer: Self-pay | Admitting: Nephrology

## 2024-01-21 ENCOUNTER — Other Ambulatory Visit: Payer: Self-pay | Admitting: Nephrology

## 2024-01-21 DIAGNOSIS — N184 Chronic kidney disease, stage 4 (severe): Secondary | ICD-10-CM

## 2024-02-10 ENCOUNTER — Other Ambulatory Visit: Payer: Self-pay | Admitting: Nurse Practitioner

## 2024-02-15 ENCOUNTER — Ambulatory Visit: Payer: Self-pay

## 2024-02-24 ENCOUNTER — Telehealth: Payer: Self-pay

## 2024-02-24 NOTE — Telephone Encounter (Signed)
 Patient was identified as falling into the [place] Measure - Diabetes.   Patient was: Appointment already scheduled for:  03/21/24.

## 2024-02-29 ENCOUNTER — Encounter: Payer: Self-pay | Admitting: Nurse Practitioner

## 2024-03-21 ENCOUNTER — Encounter: Payer: Self-pay | Admitting: Nurse Practitioner

## 2024-03-21 ENCOUNTER — Ambulatory Visit: Payer: Self-pay | Admitting: Nurse Practitioner

## 2024-03-21 VITALS — BP 131/58 | HR 66 | Wt 193.0 lb

## 2024-03-21 DIAGNOSIS — I10 Essential (primary) hypertension: Secondary | ICD-10-CM | POA: Diagnosis not present

## 2024-03-21 DIAGNOSIS — H401131 Primary open-angle glaucoma, bilateral, mild stage: Secondary | ICD-10-CM | POA: Diagnosis not present

## 2024-03-21 DIAGNOSIS — E162 Hypoglycemia, unspecified: Secondary | ICD-10-CM

## 2024-03-21 DIAGNOSIS — E1165 Type 2 diabetes mellitus with hyperglycemia: Secondary | ICD-10-CM

## 2024-03-21 DIAGNOSIS — N184 Chronic kidney disease, stage 4 (severe): Secondary | ICD-10-CM | POA: Diagnosis not present

## 2024-03-21 DIAGNOSIS — K219 Gastro-esophageal reflux disease without esophagitis: Secondary | ICD-10-CM | POA: Insufficient documentation

## 2024-03-21 DIAGNOSIS — E785 Hyperlipidemia, unspecified: Secondary | ICD-10-CM

## 2024-03-21 DIAGNOSIS — S98111A Complete traumatic amputation of right great toe, initial encounter: Secondary | ICD-10-CM | POA: Insufficient documentation

## 2024-03-21 DIAGNOSIS — Z89411 Acquired absence of right great toe: Secondary | ICD-10-CM | POA: Insufficient documentation

## 2024-03-21 DIAGNOSIS — H401130 Primary open-angle glaucoma, bilateral, stage unspecified: Secondary | ICD-10-CM | POA: Insufficient documentation

## 2024-03-21 LAB — POCT GLYCOSYLATED HEMOGLOBIN (HGB A1C): Hemoglobin A1C: 7 % — AB (ref 4.0–5.6)

## 2024-03-21 MED ORDER — JARDIANCE 10 MG PO TABS: 10.0000 mg | ORAL_TABLET | Freq: Every day | ORAL | 1 refills | Status: DC

## 2024-03-21 MED ORDER — JARDIANCE 10 MG PO TABS: 10.0000 mg | ORAL_TABLET | Freq: Every day | ORAL | 0 refills | Status: DC

## 2024-03-21 MED ORDER — GVOKE HYPOPEN 2-PACK 1 MG/0.2ML ~~LOC~~ SOAJ: 1.0000 mg | SUBCUTANEOUS | 1 refills | Status: DC | PRN

## 2024-03-21 NOTE — Assessment & Plan Note (Signed)
 BP Readings from Last 3 Encounters:  03/21/24 (!) 131/58  12/05/23 133/84  11/29/23 (!) 142/59   Blood pressure well-controlled at 131/58 mmHg. - Continue amlodipine  10 mg daily, valsartan  40 mg daily, and Lasix  20 mg daily. Discussed DASH diet and dietary sodium restrictions Continue to increase dietary efforts and exercise.

## 2024-03-21 NOTE — Progress Notes (Signed)
 SABRA

## 2024-03-21 NOTE — Progress Notes (Signed)
 "  Established Patient Office Visit  Subjective:  Patient ID: Rachel Gonzalez, female    DOB: [birth year]  Age: 74 y.o. MRN: [id number]  CC:  Chief Complaint  Patient presents with   Diabetes    HPI    Discussed the use of AI scribe software for clinical note transcription with the patient, who gave verbal consent to proceed.  History of Present Illness Rachel Gonzalez is a 74 year old female  has a past medical history of Anemia, Arthritis, CHF (congestive heart failure) (HCC), Chronic renal disease, stage 3, moderately decreased glomerular filtration rate (GFR) between 30-59 mL/min/1.73 square meter (HCC), Diabetes mellitus, GERD (gastroesophageal reflux disease), and Hypertension.  with diabetes and hypertension who presents for follow-up of her chronic medical conditions.  She was started on Jardiance  10 mg daily by her nephrologist in November for kidney health and blood sugar control. She has misplaced her new prescription and has enough medication for two to three weeks, expressing concern about the cost of replacement. Her A1c has improved to 7.0 from 9.2 in September.  She experiences episodes of hypoglycemia, with glucose levels sometimes dropping below 70, causing her to feel unable to walk and very hungry. She does not recall the exact glucose readings during these episodes. Her glucose meter is malfunctioning, showing errors, and she plans to bring it in for evaluation. She manages hypoglycemia by consuming regular soda or peppermint candies.  Her current diabetes medications include Jardiance  10 mg daily, Lantus  25 units daily, and Ozempic  2 mg weekly. She also takes atorvastatin  20 mg daily for cholesterol, amlodipine  10 mg daily, valsartan  40 mg daily, and Lasix  20 mg daily for blood pressure, and gabapentin  300 mg twice daily for neuropathy.  She uses four different eye drops, including Lumigan, Combigen, and an unspecified drop with a green top, twice daily in the right eye,  and another drop she cannot recall the name of.  She has a history of neuropathy, causing pain in her feet. She lost her right great toe due to a work-related injury, not diabetes. She manages her foot care independently after a disagreement with her previous podiatrist.  She has lost weight, going from 195 to 193 pounds, and is proud of this achievement. 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  She reports decreased sensation in her feet due to neuropathy.    Assessment & Plan      Lab Results  Component Value Date   CHOL 156 06/26/2019   HDL 78 06/26/2019   LDLCALC 68 06/26/2019   LDLDIRECT 33 04/16/2023   TRIG 43 06/26/2019   CHOLHDL 2.0 06/26/2019      Past Medical History:  Diagnosis Date   Anemia    Arthritis    right finger   CHF (congestive heart failure) (HCC)    Chronic renal disease, stage 3, moderately decreased glomerular filtration rate (GFR) between 30-59 mL/min/1.73 square meter (HCC)    Diabetes mellitus    GERD (gastroesophageal reflux disease)    Hypertension     Past Surgical History:  Procedure Laterality Date   AMPUTATION Right 05/08/2014   Procedure: AMPUTATION RAY, right great toe;  Surgeon: Jerona Harden GAILS, MD;  Location: [facility] OR;  Service: Orthopedics;  Laterality: Right;   arm surgery      CESAREAN SECTION     COLONOSCOPY     I & D EXTREMITY Right 05/08/2014   Procedure: IRRIGATION  AND DEBRIDEMENT FOOT;  Surgeon: Jerona Harden GAILS, MD;  Location: [facility] OR;  Service: Orthopedics;  Laterality: Right;   MEMBRANE PEEL Right 03/15/2018   Procedure: MEMBRANE PEEL;  Surgeon: Tobie Baptist, MD;  Location: [facility] OR;  Service: Ophthalmology;  Laterality: Right;   PHOTOCOAGULATION WITH LASER Right 03/15/2018   Procedure: PHOTOCOAGULATION WITH LASER;  Surgeon: Tobie Baptist, MD;  Location: [facility] OR;  Service: Ophthalmology;  Laterality: Right;   REPAIR OF COMPLEX TRACTION RETINAL DETACHMENT  Right 03/15/2018   Procedure: RIGHT EYE COMPLEX RETINA DETACHMENT VITRECTOMY MEMBRANE PEEL WITH AIR,GAS,SILICONE OIL PHOTOCOAGULATION;  Surgeon: Tobie Baptist, MD;  [facility];  Service: Ophthalmology;  Laterality: Right;    Family History  Problem Relation Age of Onset   Diabetes Mother    Breast cancer Neg Hx     Social History   Socioeconomic History   Marital status: Married    Spouse name: Not on file   Number of children: Not on file   Years of education: Not on file   Highest education level: Not on file  Occupational History   Not on file  Tobacco Use   Smoking status: Never   Smokeless tobacco: Never  Vaping Use   Vaping status: Never Used  Substance and Sexual Activity   Alcohol use: No   Drug use: No   Sexual activity: Not on file  Other Topics Concern   Not on file  Social History Narrative   From [place] .   Married.   Three children, 9 grandchildren.   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   Travels to [place], [address]   Social Drivers of Health   Tobacco Use: Low Risk (03/21/2024)   Patient History    Smoking Tobacco Use: Never    Smokeless Tobacco Use: Never    Passive Exposure: Not on file  Financial Resource Strain: Low Risk (07/06/2023)   Overall Financial Resource Strain (CARDIA)    Difficulty of Paying Living Expenses: Not hard at all  Food Insecurity: No Food Insecurity (03/21/2024)   Epic    Worried About Radiation Protection Practitioner of Food in the Last Year: Never true    Ran Out of Food in the Last Year: Never true  Transportation Needs: No Transportation Needs (03/21/2024)   Epic    Lack of Transportation (Medical): No    Lack of Transportation (Non-Medical): No  Physical Activity: Insufficiently Active (07/06/2023)   Exercise Vital Sign    Days of Exercise per Week: 2 days    Minutes of Exercise per Session: 10 min  Stress: No Stress Concern Present (07/06/2023)   [employer] of Occupational  Health - Occupational Stress Questionnaire    Feeling of Stress : Not at all  Social Connections: Socially Integrated (07/06/2023)   Social Connection and Isolation Panel    Frequency of Communication with Friends and Family: More than three times a week    Frequency of Social Gatherings with Friends and Family: More than three times a week    Attends Religious Services: More than 4 times per year    Active Member of [employer] or Organizations: No    Attends Engineer, Structural: More than 4 times per year    Marital Status: Married  Catering Manager Violence: Not At Risk (03/21/2024)   Epic    Fear of Current or Ex-Partner: No    Emotionally Abused: No    Physically Abused: No    Sexually Abused: No  Depression (PHQ2-9): Low Risk (03/21/2024)   Depression (PHQ2-9)    PHQ-2 Score: 0  Alcohol Screen: Low Risk (07/06/2023)   Alcohol Screen    Last Alcohol Screening Score (AUDIT): 0  Housing: Low Risk (03/21/2024)   Epic    Unable to Pay for Housing in the Last Year: No    Number of Times Moved in the Last Year: 0    Homeless in the Last Year: No  Utilities: Not At Risk (03/21/2024)   Epic    Threatened with loss of utilities: No  Health Literacy: Adequate Health Literacy (07/06/2023)   B1300 Health Literacy    Frequency of need for help with medical instructions: Rarely    Outpatient Medications Prior to Visit  Medication Sig Dispense Refill   acetaminophen  (TYLENOL ) 325 MG tablet Take 2 tablets (650 mg total) by mouth every 6 (six) hours as needed. 36 tablet 0   amLODipine  (NORVASC ) 10 MG tablet TAKE 1 TABLET EVERY DAY 90 tablet 3   atorvastatin  (LIPITOR) 20 MG tablet TAKE 1 TABLET EVERY DAY 90 tablet 3   Blood Glucose Monitoring Suppl DEVI 1 each by Does not apply route in the morning, at noon, and at bedtime. May substitute to any manufacturer covered by patient's insurance. 1 each 0   brimonidine (ALPHAGAN) 0.2 % ophthalmic solution Place 1 drop into the right eye 2 (two)  times daily.     COMBIGAN 0.2-0.5 % ophthalmic solution Place 1 drop into the right eye every 12 (twelve) hours.     dorzolamide (TRUSOPT) 2 % ophthalmic solution Place 1 drop into both eyes 2 (two) times daily.     furosemide  (LASIX ) 20 MG tablet TAKE 1 TABLET EVERY DAY 90 tablet 3   gabapentin  (NEURONTIN ) 300 MG capsule TAKE 1 CAPSULE TWICE DAILY 180 capsule 3   Glucose Blood (BLOOD GLUCOSE TEST STRIPS) STRP TEST BLOOD SUGAR THREE TIMES DAILY (MORNING, NOON, BEDTIME) 300 strip 3   LANTUS  SOLOSTAR 100 UNIT/ML Solostar Pen INJECT 25 UNITS INTO THE SKIN DAILY. 30 mL 3   LUMIGAN 0.01 % SOLN Place 1 drop into the right eye at bedtime.     nystatin  (MYCOSTATIN /NYSTOP ) powder APPLY  POWDER TOPICALLY TO AFFECTED AREA 4 TIMES DAILY 15 g 0   nystatin  ointment (MYCOSTATIN ) APPLY 1 APPLICATION TOPICALLY 2 TIMES DAILY 30 g 0   omeprazole  (PRILOSEC) 20 MG capsule Take 1 capsule by mouth once daily 90 capsule 0   RELION PEN NEEDLE 31G/8MM 31G X 8 MM MISC USE AS DIRECTED 4 TIMES DAILY 100 each 11   Semaglutide , 2 MG/DOSE, 8 MG/3ML SOPN Inject 2 mg as directed once a week. 9 mL 3   timolol (TIMOPTIC) 0.5 % ophthalmic solution Place 1 drop into the right eye 2 (two) times daily.     triamcinolone  ointment (KENALOG ) 0.5 % Apply 1 Application topically 2 (two) times daily. 30 g 0   TRUEplus Lancets 33G MISC CHECK BLOOD SUGAR IN THE MORNING, AT NOON, AND AT BEDTIME. 200 each 5   valsartan  (DIOVAN ) 40 MG tablet Take 1 tablet (40 mg total) by mouth daily. 90 tablet 1   JARDIANCE  10 MG TABS tablet Take 10 mg by mouth daily.     EQ ALLERGY RELIEF, CETIRIZINE , 10 MG tablet Take 1 tablet by mouth once daily (Patient not taking: Reported on 03/21/2024) 90 tablet 0   lidocaine  (LIDODERM ) 5 % Place 1 patch onto the skin daily. Remove & Discard patch within 12 hours or as directed by MD 30  patch 0   promethazine -dextromethorphan (PROMETHAZINE -DM) 6.25-15 MG/5ML syrup Take 5 mLs by mouth every 8 (eight) hours as needed for  cough. (Patient not taking: Reported on 03/21/2024) 180 mL 0   No facility-administered medications prior to visit.    Allergies[1]  ROS Review of Systems  Constitutional:  Negative for appetite change, chills, fatigue and fever.  HENT:  Negative for congestion, postnasal drip, rhinorrhea and sneezing.   Respiratory:  Negative for cough, shortness of breath and wheezing.   Cardiovascular:  Negative for chest pain, palpitations and leg swelling.  Gastrointestinal:  Negative for abdominal pain, constipation, nausea and vomiting.  Genitourinary:  Negative for difficulty urinating, dysuria, flank pain and frequency.  Musculoskeletal:  Negative for arthralgias, back pain and joint swelling.  Skin:  Negative for color change, pallor, rash and wound.  Neurological:  Negative for facial asymmetry, weakness, numbness and headaches.  Psychiatric/Behavioral:  Negative for behavioral problems, confusion, self-injury and suicidal ideas.       Objective:    Physical Exam Vitals and nursing note reviewed.  Constitutional:      General: She is not in acute distress.    Appearance: Normal appearance. She is obese. She is not ill-appearing, toxic-appearing or diaphoretic.  Eyes:     General: No scleral icterus.       Right eye: No discharge.        Left eye: No discharge.     Extraocular Movements: Extraocular movements intact.     Conjunctiva/sclera: Conjunctivae normal.  Cardiovascular:     Rate and Rhythm: Normal rate and regular rhythm.     Pulses: Normal pulses.     Heart sounds: Normal heart sounds. No murmur heard.    No friction rub. No gallop.  Pulmonary:     Effort: Pulmonary effort is normal. No respiratory distress.     Breath sounds: Normal breath sounds. No stridor. No wheezing, rhonchi or rales.  Chest:     Chest wall: No tenderness.  Abdominal:     General: There is no distension.     Palpations: Abdomen is soft.     Tenderness: There is no abdominal tenderness. There is  no right CVA tenderness, left CVA tenderness or guarding.  Musculoskeletal:        General: No swelling, tenderness or signs of injury.     Right lower leg: No edema.     Left lower leg: No edema.  Skin:    General: Skin is warm and dry.     Capillary Refill: Capillary refill takes less than 2 seconds.     Coloration: Skin is not jaundiced or pale.     Findings: No bruising, erythema or lesion.  Neurological:     Mental Status: She is alert and oriented to person, place, and time.     Motor: No weakness.     Coordination: Coordination normal.     Gait: Gait normal.  Psychiatric:        Mood and Affect: Mood normal.        Behavior: Behavior normal.        Thought Content: Thought content normal.        Judgment: Judgment normal.     BP (!) 131/58   Pulse 66   Wt 193 lb (87.5 kg)   SpO2 98%   BMI 34.19 kg/m  Wt Readings from Last 3 Encounters:  03/21/24 193 lb (87.5 kg)  11/29/23 195 lb (88.5 kg)  07/19/23 204 lb (92.5 kg)    Lab  Results  Component Value Date   TSH 0.838 04/10/2020   Lab Results  Component Value Date   WBC 5.2 12/15/2022   HGB 10.4 (L) 12/15/2022   HCT 34.3 12/15/2022   MCV 89 12/15/2022   PLT 225 12/15/2022   Lab Results  Component Value Date   NA 144 10/04/2023   K 5.0 10/04/2023   CO2 21 10/04/2023   GLUCOSE 92 10/04/2023   BUN 34 (H) 10/04/2023   CREATININE 2.15 (H) 10/04/2023   BILITOT 0.2 04/16/2023   ALKPHOS 184 (H) 04/16/2023   AST 14 04/16/2023   ALT 8 04/16/2023   PROT 6.6 04/16/2023   ALBUMIN 4.1 04/16/2023   CALCIUM  9.4 10/04/2023   ANIONGAP 8 04/07/2022   EGFR 24 (L) 10/04/2023   GFR 63.19 11/07/2014   Lab Results  Component Value Date   CHOL 156 06/26/2019   Lab Results  Component Value Date   HDL 78 06/26/2019   Lab Results  Component Value Date   LDLCALC 68 06/26/2019   Lab Results  Component Value Date   TRIG 43 06/26/2019   Lab Results  Component Value Date   CHOLHDL 2.0 06/26/2019   Lab Results   Component Value Date   HGBA1C 7.0 (A) 03/21/2024      Assessment & Plan:   Problem List Items Addressed This Visit       Cardiovascular and Mediastinum   Essential hypertension   BP Readings from Last 3 Encounters:  03/21/24 (!) 131/58  12/05/23 133/84  11/29/23 (!) 142/59   Blood pressure well-controlled at 131/58 mmHg. - Continue amlodipine  10 mg daily, valsartan  40 mg daily, and Lasix  20 mg daily. Discussed DASH diet and dietary sodium restrictions Continue to increase dietary efforts and exercise.             Digestive   GERD (gastroesophageal reflux disease)    Currently managed with omeprazole . - Continue omeprazole  20 mg daily.          Endocrine   Uncontrolled type 2 diabetes mellitus with hyperglycemia (HCC) - Primary   Lab Results  Component Value Date   HGBA1C 7.0 (A) 03/21/2024   Type 2 diabetes mellitus with hyperglycemia and diabetic neuropathy A1c improved to 7.0. Reports occasional hypoglycemia. Neuropathy causing foot pain. No foot wounds or redness. - Continue Jardiance  10 mg daily, Lantus  25 units daily, and Ozempic  2 mg weekly. - Ordered glucagon  injection for severe hypoglycemia. - Advised on hypoglycemia management: consume 4 oz of regular soda or glucose tablets if blood glucose <70 mg/dL. - Instructed to bring glucometer to next visit for evaluation. - Ordered 30-day refill of Jardiance  from [facility]. 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 - Advised fasting before cholesterol test.       Primary open angle glaucoma (POAG) of both eyes   Glaucoma Using multiple eye drops as prescribed by ophthalmologist.        Relevant Medications   LUMIGAN 0.01 % SOLN   dorzolamide (TRUSOPT) 2 % ophthalmic solution   COMBIGAN 0.2-0.5 % ophthalmic solution   Other Visit Diagnoses       Hypoglycemia       Relevant Medications   Glucagon  (GVOKE HYPOPEN  2-PACK) 1 MG/0.2ML SOAJ       Meds ordered this encounter  Medications   DISCONTD: JARDIANCE  10 MG TABS tablet    Sig: Take 1 tablet (10 mg total) by mouth daily.    Dispense:  30 tablet    Refill:  0   Glucagon  (GVOKE HYPOPEN  2-PACK) 1 MG/0.2ML SOAJ    Sig: Inject 1 mg into the skin as needed.    Dispense:  0.4 mL    Refill:  1   JARDIANCE  10 MG TABS tablet    Sig: Take 1 tablet (10 mg total) by mouth daily.    Dispense:  90 tablet    Refill:  1    Please send in adherence packaging with other pills.    Follow-up: Return in about 3 months (around 06/19/2024) for DM, FASTING LABS THIS WEEK.    Sinia Antosh R Shawnetta Lein, FNP     [1]  Allergies Allergen Reactions   Tramadol Nausea And Vomiting   Adhesive [Tape] Itching   Bactrim  [Sulfamethoxazole -Trimethoprim ] Other (See Comments)    AKI and hyperkalemia    Tylenol  With Codeine  #3 [Acetaminophen -Codeine ] Diarrhea and Itching    Only allergic to Codeine , patient reports no allergy to Tylenol .   "

## 2024-03-21 NOTE — Assessment & Plan Note (Signed)
 Lab Results  Component Value Date   HGBA1C 7.0 (A) 03/21/2024   Type 2 diabetes mellitus with hyperglycemia and diabetic neuropathy A1c improved to 7.0. Reports occasional hypoglycemia. Neuropathy causing foot pain. No foot wounds or redness. - Continue Jardiance  10 mg daily, Lantus  25 units daily, and Ozempic  2 mg weekly. - Ordered glucagon  injection for severe hypoglycemia. - Advised on hypoglycemia management: consume 4 oz of regular soda or glucose tablets if blood glucose <70 mg/dL. - Instructed to bring glucometer to next visit for evaluation. - Ordered 30-day refill of Jardiance  from [facility]. - Encouraged low carb diet

## 2024-03-21 NOTE — Assessment & Plan Note (Signed)
 Most recent eGFR 38, serum creatinine 1.47 Continue valsartan  40 mg daily and Jardiance  10 mg daily Avoid NSAIDs and other nephrotoxic agents Maintain close follow-up with nephrologist

## 2024-03-21 NOTE — Assessment & Plan Note (Signed)
 Lab Results  Component Value Date   CHOL 156 06/26/2019   HDL 78 06/26/2019   LDLCALC 68 06/26/2019   LDLDIRECT 33 04/16/2023   TRIG 43 06/26/2019   CHOLHDL 2.0 06/26/2019  On atorvastatin  20 mg daily - Ordered lipid panel. - Advised fasting before cholesterol test.

## 2024-03-21 NOTE — Patient Instructions (Addendum)
" °  1. Uncontrolled type 2 diabetes mellitus with hyperglycemia (HCC) (Primary) - Microalbumin/Creatinine Ratio, Urine - POCT glycosylated hemoglobin (Hb A1C) - Lipid panel; Future - JARDIANCE  10 MG TABS tablet; Take 1 tablet (10 mg total) by mouth daily.  Dispense: 30 tablet; Refill: 0  2. Hypoglycemia - Glucagon  (GVOKE HYPOPEN  2-PACK) 1 MG/0.2ML SOAJ; Inject 1 mg into the skin as needed.  Dispense: 0.4 mL; Refill: 1    Goal for fasting blood sugar ranges from 80 to 120 and 2 hours after any meal or at bedtime should be between 130 to 170.  Please call the office if your blood sugar is consistently less than 70  Treating low blood sugar If you have low blood sugar, eat or drink something with sugar in it right away. The food or drink should have 15 grams of a fast-acting carbohydrate (carb). Options include: 4 oz (120 mL) of fruit juice. 4 oz (120 mL) of soda (not diet soda). A few pieces of hard candy. Check food labels to see how many pieces to eat. 1 Tbsp (15 mL) of sugar or honey. 4 glucose tablets. 1 tube of glucose gel. Treating low blood sugar if you have diabetes Talk with your provider about how much carb you should take. If you're alert and can swallow safely, you may follow the 15:15 rule: Take 15 grams of a fast-acting carb. Check your blood sugar 15 minutes after you take the carb. If your blood sugar is still at or below 70 mg/dL (3.9 mmol/L), take 15 grams of a carb again. If your blood sugar doesn't go above 70 mg/dL (3.9 mmol/L) after 3 tries, get help right away. After your blood sugar goes back to normal, eat a meal or a snack within 1 hour. Always keep 15 grams of a fast-acting carb with you. This could be: 4 glucose tablets. A few pieces of hard candy. 1 Tbsp (15 mL) of honey or sugar. 1 tube of glucose gel. Treating very low blood sugar If your blood sugar is less than 54 mg/dL (3 mmol/L), it's an emergency. Get help right away. If you can't eat or drink, you  will need to be given glucagon . A family member or friend should learn how to check your blood sugar and give you glucagon . Ask your provider if you should keep a glucagon  kit at home. You may also need to be treated in a hospital. "

## 2024-03-21 NOTE — Assessment & Plan Note (Signed)
 Glaucoma Using multiple eye drops as prescribed by ophthalmologist.

## 2024-03-21 NOTE — Assessment & Plan Note (Signed)
" °  Currently managed with omeprazole . - Continue omeprazole  20 mg daily.   "

## 2024-03-22 ENCOUNTER — Other Ambulatory Visit (HOSPITAL_BASED_OUTPATIENT_CLINIC_OR_DEPARTMENT_OTHER): Payer: Self-pay

## 2024-03-22 ENCOUNTER — Ambulatory Visit: Payer: Self-pay | Admitting: Nurse Practitioner

## 2024-03-22 LAB — MICROALBUMIN / CREATININE URINE RATIO
Creatinine, Urine: 25.3 mg/dL
Microalb/Creat Ratio: 76 mg/g{creat} — ABNORMAL HIGH (ref 0–29)
Microalbumin, Urine: 19.3 ug/mL

## 2024-03-25 ENCOUNTER — Other Ambulatory Visit: Payer: Self-pay | Admitting: Nurse Practitioner

## 2024-03-25 DIAGNOSIS — E785 Hyperlipidemia, unspecified: Secondary | ICD-10-CM

## 2024-03-25 DIAGNOSIS — I10 Essential (primary) hypertension: Secondary | ICD-10-CM

## 2024-03-27 ENCOUNTER — Other Ambulatory Visit: Payer: Self-pay

## 2024-03-27 NOTE — Telephone Encounter (Signed)
 atorvastatin  (LIPITOR) 20 MG tablet [Pharmacy Med Name: ATORVASTATIN  CALCIUM  20 MG Oral Tablet]      amLODipine  (NORVASC ) 10 MG tablet [Pharmacy Med Name: AMLODIPINE  BESYLATE 10 MG Oral Tablet]

## 2024-03-28 ENCOUNTER — Other Ambulatory Visit: Payer: Self-pay | Admitting: Nurse Practitioner

## 2024-03-28 ENCOUNTER — Other Ambulatory Visit

## 2024-03-28 ENCOUNTER — Other Ambulatory Visit: Payer: Self-pay

## 2024-03-28 DIAGNOSIS — E162 Hypoglycemia, unspecified: Secondary | ICD-10-CM

## 2024-03-28 DIAGNOSIS — E1165 Type 2 diabetes mellitus with hyperglycemia: Secondary | ICD-10-CM

## 2024-03-28 MED ORDER — ZEGALOGUE 0.6 MG/0.6ML ~~LOC~~ SOAJ: 0.6000 mg | SUBCUTANEOUS | 1 refills | Status: AC | PRN

## 2024-03-28 NOTE — Telephone Encounter (Signed)
 JARDIANCE  10 MG TABS tablet [Pharmacy Med Name: JARDIANCE  10 MG Oral Tablet]      Pharmacy comment: DISEASE CONTRAINDICATION-CLARIFY IF OK TO FILL JARDIANCE  10MG  AT CURRENT DOSE IN NOTE TO RPH. JARDIANCE  10MG  WITH DIRECTIONS 1 QD AND PT HAS CHRONIC KIDNEY DISEASE STAGE 4 (SEVERE) GFR 15-29 ML/MIN. (DUR)

## 2024-03-28 NOTE — Telephone Encounter (Signed)
 JARDIANCE  10 MG TABS tablet [Pharmacy Med Name: JARDIANCE  10 MG Oral Tablet]      Pharmacy comment: DISEASE CONTRAINDICATION-CLARIFY IF OK TO FILL JARDIANCE  10MG  TAB AT CURRENT DOSE IN NOTE TO RPH. JARDIANCE  10MG  TAB WITH DIRECTIONS 1 TAB QD AND PT HAS CHRONIC KIDNEY DISEASE STAGE 4. (DUR)

## 2024-03-30 ENCOUNTER — Other Ambulatory Visit

## 2024-03-31 ENCOUNTER — Encounter: Payer: Self-pay | Admitting: Nurse Practitioner

## 2024-03-31 ENCOUNTER — Other Ambulatory Visit

## 2024-04-14 ENCOUNTER — Other Ambulatory Visit: Payer: Self-pay | Admitting: Nurse Practitioner

## 2024-04-14 DIAGNOSIS — K219 Gastro-esophageal reflux disease without esophagitis: Secondary | ICD-10-CM

## 2024-06-19 ENCOUNTER — Ambulatory Visit: Payer: Self-pay | Admitting: Nurse Practitioner

## 2024-07-05 ENCOUNTER — Ambulatory Visit: Payer: Self-pay

## 2024-07-11 ENCOUNTER — Ambulatory Visit
